# Patient Record
Sex: Male | Born: 1945
Health system: Southern US, Community
[De-identification: ages and names within clinical notes are randomized; demographics above are authoritative.]

## PROBLEM LIST (undated history)

## (undated) DIAGNOSIS — E781 Pure hyperglyceridemia: Secondary | ICD-10-CM

## (undated) DIAGNOSIS — E119 Type 2 diabetes mellitus without complications: Secondary | ICD-10-CM

## (undated) DIAGNOSIS — R0600 Dyspnea, unspecified: Secondary | ICD-10-CM

## (undated) DIAGNOSIS — I5042 Chronic combined systolic (congestive) and diastolic (congestive) heart failure: Secondary | ICD-10-CM

## (undated) DIAGNOSIS — Z953 Presence of xenogenic heart valve: Secondary | ICD-10-CM

## (undated) DIAGNOSIS — I429 Cardiomyopathy, unspecified: Secondary | ICD-10-CM

## (undated) DIAGNOSIS — E785 Hyperlipidemia, unspecified: Secondary | ICD-10-CM

## (undated) DIAGNOSIS — I10 Essential (primary) hypertension: Secondary | ICD-10-CM

## (undated) DIAGNOSIS — J449 Chronic obstructive pulmonary disease, unspecified: Secondary | ICD-10-CM

## (undated) DIAGNOSIS — I480 Paroxysmal atrial fibrillation: Secondary | ICD-10-CM

## (undated) DIAGNOSIS — M1990 Unspecified osteoarthritis, unspecified site: Secondary | ICD-10-CM

## (undated) DIAGNOSIS — I729 Aneurysm of unspecified site: Secondary | ICD-10-CM

## (undated) HISTORY — DX: Pure hyperglyceridemia: E78.1

## (undated) HISTORY — PX: CARDIAC VALVE REPLACEMENT: SHX585

## (undated) HISTORY — DX: Cardiomyopathy, unspecified: I42.9

## (undated) HISTORY — PX: OTHER SURGICAL HISTORY: SHX169

## (undated) HISTORY — PX: CARDIAC CATHETERIZATION: SHX172

---
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
## 2007-05-19 HISTORY — PX: AORTIC VALVE REPLACEMENT: SHX41

## 2010-10-03 ENCOUNTER — Emergency Department (HOSPITAL_COMMUNITY)
Admission: EM | Admit: 2010-10-03 | Discharge: 2010-10-03 | Payer: Self-pay | Source: Home / Self Care | Admitting: Family Medicine

## 2012-01-07 ENCOUNTER — Ambulatory Visit (HOSPITAL_COMMUNITY)
Admission: RE | Admit: 2012-01-07 | Discharge: 2012-01-07 | Disposition: A | Discharge status: Home / Self Care | Payer: 59 | Source: Ambulatory Visit | Attending: Cardiology | Admitting: Cardiology

## 2012-01-07 DIAGNOSIS — R5383 Other fatigue: Secondary | ICD-10-CM

## 2012-01-07 DIAGNOSIS — R5381 Other malaise: Secondary | ICD-10-CM | POA: Insufficient documentation

## 2012-05-23 ENCOUNTER — Emergency Department (HOSPITAL_COMMUNITY): Payer: 59

## 2012-05-23 ENCOUNTER — Emergency Department (HOSPITAL_COMMUNITY)
Admission: EM | Admit: 2012-05-23 | Discharge: 2012-05-23 | Disposition: A | Discharge status: Home / Self Care | Payer: 59 | Source: Home / Self Care | Attending: Family Medicine | Admitting: Family Medicine

## 2012-05-23 ENCOUNTER — Encounter (HOSPITAL_COMMUNITY): Payer: Self-pay

## 2012-05-23 ENCOUNTER — Emergency Department (INDEPENDENT_AMBULATORY_CARE_PROVIDER_SITE_OTHER): Payer: 59

## 2012-05-23 DIAGNOSIS — M25511 Pain in right shoulder: Secondary | ICD-10-CM

## 2012-05-23 DIAGNOSIS — M25519 Pain in unspecified shoulder: Secondary | ICD-10-CM

## 2012-05-23 MED ORDER — KETOROLAC TROMETHAMINE 60 MG/2ML IM SOLN
INTRAMUSCULAR | Status: AC
  Filled 2012-05-23: qty 2

## 2012-05-23 MED ORDER — MELOXICAM 7.5 MG PO TABS: 7.5000 mg | ORAL_TABLET | Freq: Every day | ORAL | Status: AC

## 2012-05-23 MED ORDER — TRAMADOL HCL 50 MG PO TABS: 50.0000 mg | ORAL_TABLET | Freq: Four times a day (QID) | ORAL | Status: AC | PRN

## 2012-05-23 MED ORDER — KETOROLAC TROMETHAMINE 60 MG/2ML IM SOLN
60.0000 mg | Freq: Once | INTRAMUSCULAR | Status: AC
  Administered 2012-05-23: 60 mg via INTRAMUSCULAR

## 2012-05-23 NOTE — ED Provider Notes (Signed)
Medical screening examination/treatment/procedure(s) were performed by non-physician practitioner and as supervising physician I was immediately available for consultation/collaboration.  Leslee Home, M.D.   Reuben Likes, MD 05/23/12 [phone]

## 2012-05-23 NOTE — ED Provider Notes (Signed)
History     CSN: [id number]  Arrival date & time 05/23/12  1618   First MD Initiated Contact with Patient 05/23/12 1818      Chief Complaint  Patient presents with  . Shoulder Pain    (Consider location/radiation/quality/duration/timing/severity/associated sxs/prior treatment) Patient is a 66 y.o. male presenting with shoulder pain. The history is provided by the patient.  Shoulder Pain This is a recurrent problem. The current episode started more than 2 days ago. The problem occurs constantly. The problem has not changed since onset.Exacerbated by: movement, lifting. The symptoms are relieved by NSAIDs (aleve).    Past Medical History  Diagnosis Date  . Diabetes mellitus     History reviewed. No pertinent past surgical history.  History reviewed. No pertinent family history.  History  Substance Use Topics  . Smoking status: Never Smoker   . Smokeless tobacco: Not on file  . Alcohol Use: No      Review of Systems  Constitutional: Negative.   Respiratory: Negative.   Cardiovascular: Negative.   Musculoskeletal: Positive for arthralgias. Negative for myalgias, back pain, joint swelling and gait problem.    Allergies  Review of patient's allergies indicates no known allergies.  Home Medications   Current Outpatient Rx  Name Route Sig Dispense Refill  . METFORMIN HCL 1000 MG PO TABS Oral Take 1,000 mg by mouth 2 (two) times daily with a meal.    . MELOXICAM 7.5 MG PO TABS Oral Take 1 tablet (7.5 mg total) by mouth daily. 30 tablet 1  . TRAMADOL HCL 50 MG PO TABS Oral Take 1 tablet (50 mg total) by mouth every 6 (six) hours as needed for pain. 15 tablet 0    BP 136/82  Pulse 70  Temp 98.7 F (37.1 C) (Oral)  Resp 16  SpO2 96%  Physical Exam  Nursing note and vitals reviewed. Constitutional: He is oriented to person, place, and time. Vital signs are normal. He appears well-developed and well-nourished. He is active and cooperative.  HENT:  Head:  Normocephalic.  Eyes: Conjunctivae are normal. Pupils are equal, round, and reactive to light. No scleral icterus.  Neck: Trachea normal. Neck supple.  Cardiovascular: Normal rate, regular rhythm and normal heart sounds.   Pulmonary/Chest: Effort normal and breath sounds normal.  Musculoskeletal:       Right shoulder: He exhibits tenderness, crepitus and pain. He exhibits normal range of motion, no swelling, no effusion, no deformity, no spasm, normal pulse and normal strength.       Left shoulder: Normal.       Cervical back: Normal.  Neurological: He is alert and oriented to person, place, and time. No cranial nerve deficit or sensory deficit.  Skin: Skin is warm and dry.  Psychiatric: He has a normal mood and affect. His speech is normal and behavior is normal. Judgment and thought content normal. Cognition and memory are normal.    ED Course  Procedures (including critical care time)  Labs Reviewed - No data to display Dg Shoulder Right  05/23/2012  *RADIOLOGY REPORT*  Clinical Data: Right shoulder pain.  RIGHT SHOULDER - 2+ VIEW  Comparison: None.  Findings: No evidence of fracture or dislocation.  Mild to moderate degenerative spurring is seen involving the acromioclavicular joint, and the inferior glenohumeral joint.  No other significant bone abnormality identified.  IMPRESSION:  1.  No acute findings. 2.  Acromioclavicular and glenohumeral degenerative changes.   Original Report Authenticated By: Danae Orleans, M.D.  1. Right shoulder pain       MDM  Take medication as prescribed.  Follow up with your primary care provider or ortho in one week.          Johnsie Kindred, NP 05/23/12 1901

## 2012-05-23 NOTE — ED Notes (Signed)
Denies recent injury, but has reportedly been having pain right shoulder past few weeks, worse at night. NAD

## 2012-06-19 ENCOUNTER — Ambulatory Visit (HOSPITAL_COMMUNITY)
Admission: RE | Admit: 2012-06-19 | Discharge: 2012-06-19 | Disposition: A | Discharge status: Home / Self Care | Payer: 59 | Source: Ambulatory Visit | Attending: Cardiology | Admitting: Cardiology

## 2012-06-19 DIAGNOSIS — E119 Type 2 diabetes mellitus without complications: Secondary | ICD-10-CM | POA: Insufficient documentation

## 2012-06-19 DIAGNOSIS — M79609 Pain in unspecified limb: Secondary | ICD-10-CM

## 2012-06-19 DIAGNOSIS — I739 Peripheral vascular disease, unspecified: Secondary | ICD-10-CM

## 2012-06-19 NOTE — Progress Notes (Signed)
VASCULAR LAB PRELIMINARY  ARTERIAL  ABI completed:  ABIs normal.    RIGHT    LEFT    PRESSURE WAVEFORM  PRESSURE WAVEFORM  BRACHIAL 160 Triphasic  BRACHIAL 143 Triphasic   DP   DP    AT 192 Triphasic  AT 191 Triphasic  PT 190 Triphasic  PT 189 Triphasic   PER   PER    GREAT TOE  NA GREAT TOE  NA    RIGHT LEFT  ABI >1 >1     Thomas Wright, RVT 06/19/2012, 2:43 PM

## 2012-08-09 ENCOUNTER — Emergency Department (INDEPENDENT_AMBULATORY_CARE_PROVIDER_SITE_OTHER): Payer: 59

## 2012-08-09 ENCOUNTER — Emergency Department (HOSPITAL_COMMUNITY)
Admission: EM | Admit: 2012-08-09 | Discharge: 2012-08-09 | Disposition: A | Discharge status: Home / Self Care | Payer: 59 | Source: Home / Self Care

## 2012-08-09 ENCOUNTER — Encounter (HOSPITAL_COMMUNITY): Payer: Self-pay | Admitting: Emergency Medicine

## 2012-08-09 DIAGNOSIS — S20229A Contusion of unspecified back wall of thorax, initial encounter: Secondary | ICD-10-CM

## 2012-08-09 DIAGNOSIS — S20219A Contusion of unspecified front wall of thorax, initial encounter: Secondary | ICD-10-CM

## 2012-08-09 DIAGNOSIS — W108XXA Fall (on) (from) other stairs and steps, initial encounter: Secondary | ICD-10-CM

## 2012-08-09 HISTORY — DX: Essential (primary) hypertension: I10

## 2012-08-09 NOTE — ED Notes (Signed)
Pt c/o lower back pain... Pt fell down a flight of 10 stairs (carpet flooring)... He chipped his tooth and c/o lower back pain since... Denies: head inj/loss of conscious... Pt is alert w/no signs of distress.

## 2012-08-09 NOTE — ED Provider Notes (Signed)
History     CSN: [id number]  Arrival date & time 08/09/12  1123   None     Chief Complaint  Patient presents with  . Back Pain    (Consider location/radiation/quality/duration/timing/severity/associated sxs/prior treatment) HPI Comments: 66 year old man slipped on the stair well last p.m. and slid down on his left side. He states that he is having pain in the left posterior back. It hurts with movement and taking a deep breath. He states he did strike his head but he does not have any pain oral lesions. He denies injury or pain to his neck upper back chest abdomen or extremities. He is fully awake alert and oriented and in no acute distress   Past Medical History  Diagnosis Date  . Diabetes mellitus   . Hypertension     Past Surgical History  Procedure Date  . Cardiac surgery     No family history on file.  History  Substance Use Topics  . Smoking status: Never Smoker   . Smokeless tobacco: Not on file  . Alcohol Use: No      Review of Systems  Constitutional: Negative.   HENT: Negative for nosebleeds, facial swelling, mouth sores, neck pain, neck stiffness and voice change.   Eyes: Negative.   Respiratory: Negative.   Cardiovascular: Negative.   Gastrointestinal: Negative.   Genitourinary: Negative.   Musculoskeletal: Positive for back pain. Negative for joint swelling and gait problem.  Skin: Negative.   Neurological: Negative.   Psychiatric/Behavioral: Negative.     Allergies  Review of patient's allergies indicates no known allergies.  Home Medications   Current Outpatient Rx  Name  Route  Sig  Dispense  Refill  . AMIODARONE HCL 200 MG PO TABS   Oral   Take 200 mg by mouth daily.         Marland Kitchen CARVEDILOL 25 MG PO TABS   Oral   Take 25 mg by mouth 2 (two) times daily with a meal.         . OMEGA-3 FATTY ACIDS 1000 MG PO CAPS   Oral   Take 2 g by mouth daily.         . FUROSEMIDE 20 MG PO TABS   Oral   Take 20 mg by mouth daily.           Marland Kitchen GLIPIZIDE 10 MG PO TABS   Oral   Take 10 mg by mouth 1 day or 1 dose.         Marland Kitchen LOSARTAN POTASSIUM 50 MG PO TABS   Oral   Take 50 mg by mouth daily.         Marland Kitchen METFORMIN HCL 1000 MG PO TABS   Oral   Take 1,000 mg by mouth 2 (two) times daily with a meal.         . MULTIVITAL PO   Oral   Take by mouth.         Marland Kitchen SITAGLIPTIN PHOSPHATE 100 MG PO TABS   Oral   Take 100 mg by mouth daily.         . MELOXICAM 7.5 MG PO TABS   Oral   Take 1 tablet (7.5 mg total) by mouth daily.   30 tablet   1     BP 161/84  Pulse 89  Temp 98 F (36.7 C) (Oral)  Resp 16  SpO2 95%  Physical Exam  Constitutional: He is oriented to person, place, and time. He appears well-developed and well-nourished.  HENT:  Head: Normocephalic and atraumatic.  Eyes: Conjunctivae normal and EOM are normal. Left eye exhibits no discharge.  Neck: Normal range of motion. Neck supple.  Cardiovascular: Normal rate, regular rhythm and normal heart sounds.   Pulmonary/Chest: Effort normal.       There is tenderness along the posterior ninth 10th ribs as well as the upper lumbar spine. Auscultation reveals crackles in the left base. Otherwise clear  Musculoskeletal: He exhibits tenderness. He exhibits no edema.  Neurological: He is alert and oriented to person, place, and time. No cranial nerve deficit.  Skin: Skin is warm and dry.  Psychiatric: He has a normal mood and affect.    ED Course  Procedures (including critical care time)  Labs Reviewed - No data to display Dg Ribs Unilateral W/chest Right  08/09/2012  *RADIOLOGY REPORT*  Clinical Data: Fall with right-sided flank pain and back pain.  RIGHT RIBS AND CHEST - 3+ VIEW  Comparison: Chest x-ray dated 10/03/2010  Findings: Frontal chest radiograph shows no evidence of pneumothorax, pulmonary consolidation or pleural fluid.  There is chronic elevation of the right hemidiaphragm.  Stable heart size and appearance of aortic valve replacement.   Right-sided rib films show no evidence of rib fracture or bony lesions.  IMPRESSION: No active disease.  Normal right ribs.   Original Report Authenticated By: Irish Lack, M.D.    Dg Lumbar Spine Complete  08/09/2012  *RADIOLOGY REPORT*  Clinical Data: Fall, back pain  LUMBAR SPINE - COMPLETE 4+ VIEW  Comparison: None.  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  Normal alignment.  No compression fracture, focal kyphosis or wedge-shaped deformity.  Lumbar degenerative changes most pronounced at L2-3 and L5 S1.  No definite pars defects.  Intact pedicles.  Slight curvature of the spine may be positional.  Normal SI joints.  IMPRESSION: Lower thoracic and lumbar moderately severe degenerative changes and spondylosis.  No acute finding by plain radiography   Original Report Authenticated By: Judie Petit. Shick, M.D.      1. Rib contusion   2. Back contusion       MDM  X-rays without acute findings He states he will do well on his ibuprofen 600 mg every 6 hours when necessary pain after taking with food Any change in status such as shortness of breath, cough, fever or other problems may return or go to your doctor. Take deep breaths every few minutes to keep lungs open and clear.         Hayden Rasmussen, NP 08/09/12 [phone]

## 2012-08-09 NOTE — ED Provider Notes (Signed)
Medical screening examination/treatment/procedure(s) were performed by resident physician or non-physician practitioner and as supervising physician I was immediately available for consultation/collaboration.   Carine Nordgren DOUGLAS MD.    Monda Chastain D Lalaine Overstreet, MD 08/09/12 1831 

## 2013-06-07 ENCOUNTER — Encounter: Payer: Self-pay | Admitting: Cardiology

## 2013-06-18 ENCOUNTER — Encounter: Payer: Self-pay | Admitting: Cardiology

## 2013-06-18 ENCOUNTER — Ambulatory Visit (INDEPENDENT_AMBULATORY_CARE_PROVIDER_SITE_OTHER): Payer: Medicare Other | Admitting: Cardiology

## 2013-06-18 VITALS — BP 128/70 | HR 81 | Ht 72.0 in | Wt 212.0 lb

## 2013-06-18 DIAGNOSIS — Z954 Presence of other heart-valve replacement: Secondary | ICD-10-CM

## 2013-06-18 DIAGNOSIS — I428 Other cardiomyopathies: Secondary | ICD-10-CM

## 2013-06-18 DIAGNOSIS — I429 Cardiomyopathy, unspecified: Secondary | ICD-10-CM

## 2013-06-18 DIAGNOSIS — Z952 Presence of prosthetic heart valve: Secondary | ICD-10-CM

## 2013-06-18 DIAGNOSIS — I4891 Unspecified atrial fibrillation: Secondary | ICD-10-CM

## 2013-06-18 DIAGNOSIS — E785 Hyperlipidemia, unspecified: Secondary | ICD-10-CM | POA: Insufficient documentation

## 2013-06-18 DIAGNOSIS — M549 Dorsalgia, unspecified: Secondary | ICD-10-CM

## 2013-06-18 DIAGNOSIS — I10 Essential (primary) hypertension: Secondary | ICD-10-CM | POA: Insufficient documentation

## 2013-06-18 DIAGNOSIS — E781 Pure hyperglyceridemia: Secondary | ICD-10-CM

## 2013-06-18 NOTE — Patient Instructions (Addendum)
Your physician recommends that you continue on your current medications as directed. Please refer to the Current Medication list given to you today.  Your physician wants you to follow-up in: 6 months with Dr. Anne Fu. You will receive a reminder letter in the mail two months in advance. If you don't receive a letter, please call our office to schedule the follow-up appointment.  Patient is cleared for Back Surgery should he decide to move forward with surgery per Dr. Anne Fu

## 2013-06-18 NOTE — Progress Notes (Signed)
Patient ID: Freada Bergeron, male   DOB: October 08, 1945, 67 y.o.   MRN: [id number]      [address] [place], [place]  [address] Phone: [phone] Fax:  [phone]  Date:  06/18/2013   ID:  Thomas Wright, DOB 1946/05/28, MRN [id number]  PCP:  Rozanna Box, MD   History of Present Illness: Thomas Wright is a 67 y.o. male history of bioprosthetic aortic valve replacement, hypertension, hyperlipidemia here for followup. Aortic valve was replaced 05/2007, 29 mm bioprosthetic Edwards valve. Relative [place]. At the time, ejection fraction 30% on cardiac catheterization. 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  He had atrial fibrillation as well and had been on amiodarone since surgery. I stopped it in 2013. No Further episodes.  Mild neuropathy.  Diabetes-previous hemoglobin A1c 10.4 is uncontrolled. He is working closely with his primary physician to help with this.  Will have possible hip surgery in the next few months. ?back surgery. He is more concerned about the back. 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 Great eye exam recently. Excellent.    Wt Readings from Last 3 Encounters:  06/18/13 96.163 kg (212 lb)     Past Medical History  Diagnosis Date  . Diabetes mellitus   . Hypertension   . Atrial fibrillation   . Hypertriglyceridemia     Past Surgical History  Procedure Laterality Date  . Cardiac surgery  05/2007    Aortic Valve Replacement    Current Outpatient Prescriptions  Medication Sig Dispense Refill  . aspirin 81 MG tablet Take 81 mg by mouth daily.      . carvedilol (COREG) 25 MG tablet Take 25 mg by mouth 2 (two) times daily with a meal.      . fish oil-omega-3 fatty acids 1000 MG capsule Take 2 g by mouth daily.      . furosemide (LASIX) 20 MG tablet Take 20 mg by mouth daily.      Marland Kitchen glipiZIDE (GLUCOTROL) 10 MG tablet Take 10 mg by mouth 1 day or 1 dose.      . losartan (COZAAR) 50 MG tablet Take 50 mg by mouth daily.        . metFORMIN (GLUCOPHAGE) 1000 MG tablet Take 1,000 mg by mouth 2 (two) times daily with a meal.      . Multiple Vitamins-Minerals (MULTIVITAL PO) Take by mouth.       No current facility-administered medications for this visit.    Allergies:   No Known Allergies  Social History:  The patient  reports that he has never smoked. He does not have any smokeless tobacco history on file. He reports that he does not drink alcohol or use illicit drugs.   ROS:  Please see the history of present illness.   No bleeding, no CVA, no CP, SOB.    All other systems reviewed and negative.   PHYSICAL EXAM: VS:  BP 128/70  Pulse 81  Ht 6' (1.829 m)  Wt 96.163 kg (212 lb)  BMI 28.75 kg/m2 Well nourished, well developed, in no acute distress HEENT: normal Neck: no JVD Cardiac:  normal S1, sharp S2; RRR; no murmur Lungs:  clear to auscultation bilaterally, no wheezing, rhonchi or rales Abd: soft, nontender, no hepatomegaly Ext: no edema Skin: warm and dry Neuro: no focal abnormalities noted  EKG: previously NSR.  ASSESSMENT AND PLAN:  67 year old with aortic valve replacement 2008, nonischemic cardiomyopathy EF 45%, prior atrial fibrillation  no further.  1. Atrial fibrillation-no longer. Last EKG reassuring. He had stopped his amiodarone at previous visit. He is doing well. Aspirin. 2. Prior aortic valve replacement-bioprosthetic. Doing well. No change in symptoms. Last echocardiogram showed normal function. 3. Please use dental antibiotics for prophylaxis. 4. If he undergoes surgery for back pain/hip pain, he may proceed with low to moderate cardiac risk. Prior cardiac catheterization was reassuring. Ejection fraction 45% last checked. No anginal symptoms. 5. Diabetes-continue with close monitoring.  Signed, Donato Schultz, MD [facility]  06/18/2013 10:44 AM

## 2013-06-30 ENCOUNTER — Telehealth: Payer: Self-pay | Admitting: Cardiology

## 2013-06-30 NOTE — Telephone Encounter (Signed)
New Problem:  Pt states he wants to know if it's safe for him to take Viagra or another ED drug. 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  Pt would like to have a couple samples if possible to see if it works. Please advise

## 2013-07-01 NOTE — Telephone Encounter (Signed)
His aortic root is 4.5 cm. Ejection fraction approximately 40%. He has not had any syncope or low blood pressure. I would be okay with him trying Viagra. If he develops dizziness or low blood pressure, please stop. Do not take with other nitrates. I do not believe that we have any samples. I would be fine with him trying Viagra 25 mg one tablet when necessary dispense 6 tablets with 3 refills.

## 2013-07-01 NOTE — Telephone Encounter (Signed)
Please Review and advise 

## 2013-07-03 MED ORDER — SILDENAFIL CITRATE 25 MG PO TABS: 25.0000 mg | ORAL_TABLET | ORAL | Status: DC | PRN

## 2013-07-03 NOTE — Addendum Note (Signed)
Addended by: Corianna Avallone, Bartolo Darter D on: 07/03/2013 04:44 PM   Modules accepted: Orders

## 2013-07-03 NOTE — Telephone Encounter (Signed)
Advised patient that Dr. Anne Fu is okay with an Rx for Viagra 25 mg one tablet as needed,dispense 6 tablets with 3 refills, Advised to not take with Nitrates, if he develops any signs of dizziness or low blood pressure to stop the Viagra.

## 2013-08-24 ENCOUNTER — Other Ambulatory Visit: Payer: Self-pay

## 2013-08-24 MED ORDER — CARVEDILOL 25 MG PO TABS: 25.0000 mg | ORAL_TABLET | Freq: Two times a day (BID) | ORAL | Status: DC

## 2013-08-24 MED ORDER — LOSARTAN POTASSIUM 50 MG PO TABS: 50.0000 mg | ORAL_TABLET | Freq: Every day | ORAL | Status: DC

## 2013-09-17 DIAGNOSIS — I729 Aneurysm of unspecified site: Secondary | ICD-10-CM

## 2013-09-17 HISTORY — DX: Aneurysm of unspecified site: I72.9

## 2013-12-07 ENCOUNTER — Other Ambulatory Visit: Payer: Self-pay | Admitting: Cardiology

## 2013-12-07 ENCOUNTER — Encounter: Payer: Self-pay | Admitting: Cardiology

## 2013-12-07 DIAGNOSIS — I429 Cardiomyopathy, unspecified: Secondary | ICD-10-CM

## 2013-12-08 ENCOUNTER — Ambulatory Visit (HOSPITAL_COMMUNITY): Payer: Medicare HMO | Attending: Cardiology | Admitting: Radiology

## 2013-12-08 DIAGNOSIS — I359 Nonrheumatic aortic valve disorder, unspecified: Secondary | ICD-10-CM

## 2013-12-08 DIAGNOSIS — I428 Other cardiomyopathies: Secondary | ICD-10-CM | POA: Insufficient documentation

## 2013-12-08 DIAGNOSIS — I429 Cardiomyopathy, unspecified: Secondary | ICD-10-CM

## 2013-12-08 NOTE — Progress Notes (Signed)
Echocardiogram performed.  

## 2013-12-09 ENCOUNTER — Telehealth: Payer: Self-pay | Admitting: *Deleted

## 2013-12-09 DIAGNOSIS — I428 Other cardiomyopathies: Secondary | ICD-10-CM

## 2013-12-09 DIAGNOSIS — I10 Essential (primary) hypertension: Secondary | ICD-10-CM

## 2013-12-09 DIAGNOSIS — Z952 Presence of prosthetic heart valve: Secondary | ICD-10-CM

## 2013-12-09 NOTE — Telephone Encounter (Signed)
I spoke with pt about results of echo. He would like to proceed with CTA to evaluate ascending aorta He is aware our office will call him when scheduled at [facility]

## 2013-12-09 NOTE — Telephone Encounter (Signed)
Message copied by Verna Czech on Wed Dec 09, 2013  8:23 AM ------      Message from: [name] C      Created: Wed Dec 09, 2013  6:31 AM       Aortic root is increased from prior 28mm from 16mm. Please order CTA of aorta ascending to further evaluate. Dx. Dilated aortic root. ------

## 2013-12-10 ENCOUNTER — Other Ambulatory Visit (INDEPENDENT_AMBULATORY_CARE_PROVIDER_SITE_OTHER): Payer: 59

## 2013-12-10 DIAGNOSIS — I428 Other cardiomyopathies: Secondary | ICD-10-CM

## 2013-12-10 DIAGNOSIS — Z952 Presence of prosthetic heart valve: Secondary | ICD-10-CM

## 2013-12-10 LAB — BASIC METABOLIC PANEL
BUN: 17 mg/dL (ref 6–23)
CALCIUM: 9.3 mg/dL (ref 8.4–10.5)
CO2: 28 mEq/L (ref 19–32)
CREATININE: 0.8 mg/dL (ref 0.4–1.5)
Chloride: 97 mEq/L (ref 96–112)
GFR: 101.29 mL/min (ref >60.00)
GLUCOSE: 450 mg/dL — AB (ref 70–99)
Potassium: 5.8 mEq/L — ABNORMAL HIGH (ref 3.5–5.1)
Sodium: 132 mEq/L — ABNORMAL LOW (ref 135–145)

## 2013-12-10 NOTE — Addendum Note (Signed)
Addended by: Joelyn Oms F on: 12/10/2013 10:50 AM   Modules accepted: Orders

## 2013-12-10 NOTE — Telephone Encounter (Signed)
Bmp ordered prior to CTA Horton Chin RN

## 2013-12-11 ENCOUNTER — Telehealth: Payer: Self-pay | Admitting: Cardiology

## 2013-12-11 ENCOUNTER — Ambulatory Visit (INDEPENDENT_AMBULATORY_CARE_PROVIDER_SITE_OTHER): Payer: Commercial Managed Care - HMO | Admitting: *Deleted

## 2013-12-11 DIAGNOSIS — I428 Other cardiomyopathies: Secondary | ICD-10-CM

## 2013-12-11 DIAGNOSIS — Z954 Presence of other heart-valve replacement: Secondary | ICD-10-CM

## 2013-12-11 DIAGNOSIS — I4891 Unspecified atrial fibrillation: Secondary | ICD-10-CM

## 2013-12-11 DIAGNOSIS — I429 Cardiomyopathy, unspecified: Secondary | ICD-10-CM

## 2013-12-11 DIAGNOSIS — Z952 Presence of prosthetic heart valve: Secondary | ICD-10-CM

## 2013-12-11 LAB — BASIC METABOLIC PANEL
BUN: 14 mg/dL (ref 6–23)
CALCIUM: 9 mg/dL (ref 8.4–10.5)
CO2: 27 mEq/L (ref 19–32)
CREATININE: 0.7 mg/dL (ref 0.4–1.5)
Chloride: 96 mEq/L (ref 96–112)
GFR: 114.2 mL/min (ref >60.00)
Glucose, Bld: 440 mg/dL — ABNORMAL HIGH (ref 70–99)
Potassium: 4.2 mEq/L (ref 3.5–5.1)
SODIUM: 131 meq/L — AB (ref 135–145)

## 2013-12-11 NOTE — Telephone Encounter (Signed)
New message ° ° ° ° ° °Returning a nurses call °

## 2013-12-14 ENCOUNTER — Ambulatory Visit (INDEPENDENT_AMBULATORY_CARE_PROVIDER_SITE_OTHER)
Admission: RE | Admit: 2013-12-14 | Discharge: 2013-12-14 | Disposition: A | Discharge status: Home / Self Care | Payer: Commercial Managed Care - HMO | Source: Ambulatory Visit | Attending: Cardiology | Admitting: Cardiology

## 2013-12-14 DIAGNOSIS — I428 Other cardiomyopathies: Secondary | ICD-10-CM

## 2013-12-14 DIAGNOSIS — I10 Essential (primary) hypertension: Secondary | ICD-10-CM

## 2013-12-14 DIAGNOSIS — Z952 Presence of prosthetic heart valve: Secondary | ICD-10-CM

## 2013-12-14 MED ORDER — IOHEXOL 350 MG/ML SOLN
100.0000 mL | Freq: Once | INTRAVENOUS | Status: AC | PRN
  Administered 2013-12-14: 100 mL via INTRAVENOUS

## 2013-12-15 ENCOUNTER — Ambulatory Visit (INDEPENDENT_AMBULATORY_CARE_PROVIDER_SITE_OTHER): Payer: Commercial Managed Care - HMO | Admitting: Cardiology

## 2013-12-15 ENCOUNTER — Encounter: Payer: Self-pay | Admitting: Cardiology

## 2013-12-15 VITALS — BP 118/60 | HR 92 | Ht 72.0 in | Wt 199.0 lb

## 2013-12-15 DIAGNOSIS — I4891 Unspecified atrial fibrillation: Secondary | ICD-10-CM

## 2013-12-15 DIAGNOSIS — I10 Essential (primary) hypertension: Secondary | ICD-10-CM

## 2013-12-15 DIAGNOSIS — I429 Cardiomyopathy, unspecified: Secondary | ICD-10-CM

## 2013-12-15 DIAGNOSIS — I77810 Thoracic aortic ectasia: Secondary | ICD-10-CM

## 2013-12-15 DIAGNOSIS — I428 Other cardiomyopathies: Secondary | ICD-10-CM

## 2013-12-15 DIAGNOSIS — E119 Type 2 diabetes mellitus without complications: Secondary | ICD-10-CM

## 2013-12-15 DIAGNOSIS — Z954 Presence of other heart-valve replacement: Secondary | ICD-10-CM

## 2013-12-15 DIAGNOSIS — Z952 Presence of prosthetic heart valve: Secondary | ICD-10-CM

## 2013-12-15 MED ORDER — LOSARTAN POTASSIUM 50 MG PO TABS: 50.0000 mg | ORAL_TABLET | Freq: Every day | ORAL | Status: DC

## 2013-12-15 MED ORDER — FUROSEMIDE 20 MG PO TABS: 20.0000 mg | ORAL_TABLET | Freq: Every day | ORAL | Status: DC

## 2013-12-15 NOTE — Addendum Note (Signed)
Addended by: Phineas Inches D on: 12/15/2013 09:44 AM   Modules accepted: Orders

## 2013-12-15 NOTE — Patient Instructions (Signed)
Your physician recommends that you continue on your current medications as directed. Please refer to the Current Medication list given to you today.  Your physician wants you to follow-up in: 6 months with Dr. Skains. You will receive a reminder letter in the mail two months in advance. If you don't receive a letter, please call our office to schedule the follow-up appointment.  

## 2013-12-15 NOTE — Progress Notes (Signed)
[facility]. [address], [address] [place], [place]  [address] Phone: [phone] Fax:  [phone]  Date:  12/15/2013   ID:  Thomas Wright, DOB [birth year], MRN [id number]  PCP:  [name], MD   History of Present Illness: Thomas Wright is a 68 y.o. male with bioprosthetic aortic valve repair, dilated aortic root hypertension, hyperlipidemia here for followup.   His echocardiogram demonstrated sinus of Valsalva of approximately 47 mm. Because of this, a CT angiogram was performed which was reassuring only showing 42 mm. He was reassured. Unfortunately, he is going through a large amount of stress in his life, his wife is no longer with him. 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   [name] is orthopedic physician. He has been taking Tylenol and this has helped with his back and hip pain. He is not interested in performing surgery at this time because of cost.   Recent skin infections. On Doxy. ?esophagitis  Aortic valve repair- 2008. Had cardiomyopathy at the time 20% No CAD. EF. Jan 2009 - cardioverted. Current ejection fraction 45%  with aortic root dimension of 4.5 cm. no longer on amiodarone.   Diabetes- prior hemoglobin A1c 10.4-uncontrolled. Working closely with [name]   Hyperlipidemia-triglycerides 259, LDL 99, likely falsely lowered by elevated triglycerides. Fish oil will improve this. Continue to exercise, diet. I agree that prescription medicine may be needed in the future. His triglycerides will be challenging to control in the setting of uncontrolled diabetes however.   Wt Readings from Last 3 Encounters:  12/15/13 199 lb (90.266 kg)  06/18/13 212 lb (96.163 kg)     Past Medical History  Diagnosis Date  . Diabetes mellitus   . Hypertension   . Atrial fibrillation   .  Hypertriglyceridemia   . Afib     Stopped amiodarone 2013-no further episodes of atrial fibrillation. Postoperative 2008  . H/O aortic valve replacement with tissue graft     2008.  . Back pain   . Cardiomyopathy     EF originally 20%-now 45%, no CAD on cath    Past Surgical History  Procedure Laterality Date  . Cardiac surgery  05/2007    Aortic Valve Replacement    Current Outpatient Prescriptions  Medication Sig Dispense Refill  . aspirin 81 MG tablet Take 81 mg by mouth daily.      . carvedilol (COREG) 25 MG tablet Take 1 tablet (25 mg total) by mouth 2 (two) times daily with a meal.  60 tablet  6  . fish oil-omega-3 fatty acids 1000 MG capsule Take 2 g by mouth daily.      . furosemide (LASIX) 20 MG tablet Take 20 mg by mouth daily.      Marland Kitchen glipiZIDE (GLUCOTROL) 10 MG tablet Take 10 mg by mouth 1 day or 1 dose.      . losartan (COZAAR) 50 MG tablet Take 1 tablet (50 mg total) by mouth daily.  30 tablet  6  . metFORMIN (GLUCOPHAGE) 1000 MG tablet Take 1,000 mg by mouth 2 (two) times daily with a meal.      . Multiple Vitamins-Minerals (MULTIVITAL PO) Take by mouth.      . sildenafil (VIAGRA) 25 MG tablet Take 1 tablet (25 mg total) by mouth as needed for  erectile dysfunction.  6 tablet  3   No current facility-administered medications for this visit.    Allergies:   No Known Allergies  Social History:  The patient  reports that he has never smoked. He does not have any smokeless tobacco history on file. He reports that he does not drink alcohol or use illicit drugs.   ROS:  Please see the history of present illness.   Denies any fevers, chills, orthopnea, PND    PHYSICAL EXAM: VS:  Ht 6' (1.829 m)  Wt 199 lb (90.266 kg)  BMI 26.98 kg/m2 Well nourished, well developed, in no acute distress HEENT: normal Neck: no JVD Cardiac:  normal S1, S2; RRR; no significant murmur Lungs:  clear to auscultation bilaterally, no wheezing, rhonchi or rales Abd: soft, nontender, no  hepatomegaly Ext: no edema Skin: warm and dry Neuro: no focal abnormalities noted  EKG:  12/15/13-sinus rhythm, LVH, prolonged QT 489 ms QTC.     ECHO 12/08/13: - Left ventricle: The cavity size was mildly dilated. Wall thickness was increased in a pattern of mild LVH. There was mild focal basal hypertrophy of the septum. Systolic function was mildly reduced. The estimated ejection fraction was in the range of 45% to 50%. Diffuse hypokinesis. Features are consistent with a pseudonormal left ventricular filling pattern, with concomitant abnormal relaxation and increased filling pressure (grade 2 diastolic dysfunction). Doppler parameters are consistent with high ventricular filling pressure. - Aortic valve: A bioprosthesis was present. Trivial regurgitation. - Aortic root: The aortic root was moderately dilated. - Mitral valve: Calcified annulus. - Left atrium: The atrium was moderately dilated. Impressions:  - Aortic root moderately dilated; suggest CTA or MRA to further assess.  CTA 12/14/13 -  Mild dilatation of the proximal aorta at the sinuses of Valsalva, measuring 4.2 cm in estimated diameter. The rest of the thoracic aorta is of normal caliber. No dissection is identified. Scattered calcified plaque is identified in the distribution of the LAD.     ASSESSMENT AND PLAN:  1. Dilated aortic root. CT angiogram was reassuring. This in fact only showed 42 mm. Echocardiogram had been estimating 45-47 mm Sinus Valsalva. We will continue to monitor periodically with echocardiogram. Reassurance. Continue with aggressive pressure control.  2. Aortic valve replacement-doing well. No symptoms. Echocardiogram reassuring. 3. Hypertension-under good control. 4. Cardiomyopathy-much improved, 45-50% EF. During aortic valve replacement, EF was 20%. 5. Postop atrial fibrillation-no further episodes. Off amiodarone. 6. Diabetes-working diligently on this. Stress the importance of  this. 7. Six-month followup. 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  Signed, [name], MD [facility]  12/15/2013 9:04 AM

## 2013-12-18 ENCOUNTER — Telehealth: Payer: Self-pay | Admitting: Cardiology

## 2013-12-18 MED ORDER — GLIPIZIDE 5 MG PO TABS: 5.0000 mg | ORAL_TABLET | Freq: Every day | ORAL | Status: DC

## 2013-12-18 NOTE — Telephone Encounter (Signed)
Message copied by Aldora Perman, Jerl Mina on Fri Dec 18, 2013  8:47 AM ------      Message from: Jerline Pain      Created: Fri Dec 11, 2013  4:56 PM       Potassium is now normal, excellent. Glucose still remains significantly elevated. Please discuss with your primary physician, diabetic doctor. ------

## 2013-12-18 NOTE — Telephone Encounter (Signed)
Lab results with medication change by PCP

## 2013-12-26 ENCOUNTER — Emergency Department (HOSPITAL_COMMUNITY)
Admission: EM | Admit: 2013-12-26 | Discharge: 2013-12-26 | Discharge status: Against Medical Advice | Payer: Commercial Managed Care - HMO | Source: Home / Self Care

## 2013-12-26 ENCOUNTER — Encounter (HOSPITAL_COMMUNITY): Payer: Self-pay | Admitting: Emergency Medicine

## 2013-12-26 NOTE — ED Notes (Signed)
Pt walked out the room saying "he was leaving and could not wait any longer" Adv pt he was next and the provider would be with him shortly Pt kept on walking and left AMA Notified Shanon Brow, NP

## 2013-12-26 NOTE — ED Provider Notes (Signed)
CSN: [id number]     Arrival date & time 12/26/13  1007 History   First MD Initiated Contact with Patient 12/26/13 1152     Chief Complaint  Patient presents with  . Back Pain   (Consider location/radiation/quality/duration/timing/severity/associated sxs/prior Treatment) HPI Comments: Pt left prior to being seen by provider   Past Medical History  Diagnosis Date  . Diabetes mellitus   . Hypertension   . Atrial fibrillation   . Hypertriglyceridemia   . Afib     Stopped amiodarone 2013-no further episodes of atrial fibrillation. Postoperative 2008  . H/O aortic valve replacement with tissue graft     2008.  . Back pain   . Cardiomyopathy     EF originally 20%-now 45%, no CAD on cath   Past Surgical History  Procedure Laterality Date  . Cardiac surgery  05/2007    Aortic Valve Replacement   Family History  Problem Relation Age of Onset  . Colon cancer Father   . Liver disease Brother   . Heart murmur Child   . Congenital heart disease Other    History  Substance Use Topics  . Smoking status: Never Smoker   . Smokeless tobacco: Not on file  . Alcohol Use: No    Review of Systems  Allergies  Review of patient's allergies indicates no known allergies.  Home Medications   Current Outpatient Rx  Name  Route  Sig  Dispense  Refill  . carvedilol (COREG) 25 MG tablet   Oral   Take 1 tablet (25 mg total) by mouth 2 (two) times daily with a meal.   60 tablet   6   . metFORMIN (GLUCOPHAGE) 1000 MG tablet   Oral   Take 1,000 mg by mouth 2 (two) times daily with a meal.         . aspirin 81 MG tablet   Oral   Take 81 mg by mouth daily.         . fish oil-omega-3 fatty acids 1000 MG capsule   Oral   Take 2 g by mouth daily.         . furosemide (LASIX) 20 MG tablet   Oral   Take 1 tablet (20 mg total) by mouth daily.   90 tablet   2   . glipiZIDE (GLUCOTROL) 5 MG tablet   Oral   Take 1 tablet (5 mg total) by mouth daily before breakfast.   90  tablet   0   . losartan (COZAAR) 50 MG tablet   Oral   Take 1 tablet (50 mg total) by mouth daily.   90 tablet   2   . Multiple Vitamins-Minerals (MULTIVITAL PO)   Oral   Take by mouth.         . sildenafil (VIAGRA) 25 MG tablet   Oral   Take 1 tablet (25 mg total) by mouth as needed for erectile dysfunction.   6 tablet   3    BP 116/71  Pulse 81  Temp(Src) 97.8 F (36.6 C) (Oral)  Resp 18  SpO2 96% Physical Exam  ED Course  Procedures (including critical care time) Labs Review Labs Reviewed - No data to display Imaging Review No results found.   MDM  No diagnosis found. Not seen by provider    Janne Napoleon, NP 12/26/13 1534

## 2013-12-26 NOTE — ED Notes (Addendum)
Pt c/o back/hip pain on right side onset x1 month Reports hx of crushed spine; ran out of his tramadol 50 mg and needing refill Ortho has suggested surgery but pt can't afford it at the moment Pain increases w/activity Alert w/no signs of acute distress.

## 2013-12-29 NOTE — ED Provider Notes (Signed)
Pt LWBS  Gregor Hams, MD 12/29/13 1640

## 2014-02-04 ENCOUNTER — Other Ambulatory Visit: Payer: Self-pay

## 2014-02-04 MED ORDER — CARVEDILOL 25 MG PO TABS: 25.0000 mg | ORAL_TABLET | Freq: Two times a day (BID) | ORAL | Status: DC

## 2014-05-05 ENCOUNTER — Encounter (HOSPITAL_COMMUNITY)
Admission: EM | Disposition: A | Discharge status: Home / Self Care | Payer: Self-pay | Source: Home / Self Care | Attending: Internal Medicine

## 2014-05-05 ENCOUNTER — Emergency Department (HOSPITAL_COMMUNITY): Payer: Medicare HMO

## 2014-05-05 ENCOUNTER — Inpatient Hospital Stay (HOSPITAL_COMMUNITY)
Admission: EM | Admit: 2014-05-05 | Discharge: 2014-05-06 | DRG: 603 | Disposition: A | Discharge status: Home / Self Care | Payer: Medicare HMO | Attending: Internal Medicine | Admitting: Internal Medicine

## 2014-05-05 ENCOUNTER — Inpatient Hospital Stay (HOSPITAL_COMMUNITY): Payer: Medicare HMO | Admitting: Anesthesiology

## 2014-05-05 ENCOUNTER — Encounter (HOSPITAL_COMMUNITY): Payer: Medicare HMO | Admitting: Anesthesiology

## 2014-05-05 ENCOUNTER — Encounter (HOSPITAL_COMMUNITY): Payer: Self-pay | Admitting: Emergency Medicine

## 2014-05-05 ENCOUNTER — Inpatient Hospital Stay: Admit: 2014-05-05 | Payer: Self-pay | Admitting: Orthopedic Surgery

## 2014-05-05 DIAGNOSIS — I428 Other cardiomyopathies: Secondary | ICD-10-CM | POA: Diagnosis present

## 2014-05-05 DIAGNOSIS — M65839 Other synovitis and tenosynovitis, unspecified forearm: Secondary | ICD-10-CM | POA: Diagnosis present

## 2014-05-05 DIAGNOSIS — M79609 Pain in unspecified limb: Secondary | ICD-10-CM | POA: Diagnosis present

## 2014-05-05 DIAGNOSIS — M65849 Other synovitis and tenosynovitis, unspecified hand: Secondary | ICD-10-CM | POA: Diagnosis present

## 2014-05-05 DIAGNOSIS — Z7982 Long term (current) use of aspirin: Secondary | ICD-10-CM | POA: Diagnosis not present

## 2014-05-05 DIAGNOSIS — Z952 Presence of prosthetic heart valve: Secondary | ICD-10-CM | POA: Diagnosis not present

## 2014-05-05 DIAGNOSIS — L02519 Cutaneous abscess of unspecified hand: Principal | ICD-10-CM | POA: Diagnosis present

## 2014-05-05 DIAGNOSIS — M659 Synovitis and tenosynovitis, unspecified: Secondary | ICD-10-CM | POA: Diagnosis present

## 2014-05-05 DIAGNOSIS — IMO0002 Reserved for concepts with insufficient information to code with codable children: Secondary | ICD-10-CM | POA: Diagnosis present

## 2014-05-05 DIAGNOSIS — Z954 Presence of other heart-valve replacement: Secondary | ICD-10-CM

## 2014-05-05 DIAGNOSIS — Z800 Family history of malignant neoplasm of digestive organs: Secondary | ICD-10-CM

## 2014-05-05 DIAGNOSIS — I429 Cardiomyopathy, unspecified: Secondary | ICD-10-CM

## 2014-05-05 DIAGNOSIS — E119 Type 2 diabetes mellitus without complications: Secondary | ICD-10-CM | POA: Diagnosis present

## 2014-05-05 DIAGNOSIS — Z791 Long term (current) use of non-steroidal anti-inflammatories (NSAID): Secondary | ICD-10-CM | POA: Diagnosis not present

## 2014-05-05 DIAGNOSIS — I10 Essential (primary) hypertension: Secondary | ICD-10-CM | POA: Diagnosis present

## 2014-05-05 DIAGNOSIS — E781 Pure hyperglyceridemia: Secondary | ICD-10-CM | POA: Diagnosis present

## 2014-05-05 DIAGNOSIS — L03019 Cellulitis of unspecified finger: Principal | ICD-10-CM

## 2014-05-05 DIAGNOSIS — E1165 Type 2 diabetes mellitus with hyperglycemia: Secondary | ICD-10-CM

## 2014-05-05 HISTORY — PX: I & D EXTREMITY: SHX5045

## 2014-05-05 LAB — CBC WITH DIFFERENTIAL/PLATELET
Basophils Absolute: 0 10*3/uL (ref 0.0–0.1)
Basophils Relative: 0 % (ref 0–1)
EOS ABS: 0.2 10*3/uL (ref 0.0–0.7)
Eosinophils Relative: 1 % (ref 0–5)
HCT: 38.9 % — ABNORMAL LOW (ref 39.0–52.0)
Hemoglobin: 13.7 g/dL (ref 13.0–17.0)
Lymphocytes Relative: 18 % (ref 12–46)
Lymphs Abs: 3.2 10*3/uL (ref 0.7–4.0)
MCH: 28.7 pg (ref 26.0–34.0)
MCHC: 35.2 g/dL (ref 30.0–36.0)
MCV: 81.6 fL (ref 78.0–100.0)
Monocytes Absolute: 2 10*3/uL — ABNORMAL HIGH (ref 0.1–1.0)
Monocytes Relative: 12 % (ref 3–12)
NEUTROS PCT: 69 % (ref 43–77)
Neutro Abs: 11.8 10*3/uL — ABNORMAL HIGH (ref 1.7–7.7)
PLATELETS: 308 10*3/uL (ref 150–400)
RBC: 4.77 MIL/uL (ref 4.22–5.81)
RDW: 12.4 % (ref 11.5–15.5)
WBC: 17.1 10*3/uL — ABNORMAL HIGH (ref 4.0–10.5)

## 2014-05-05 LAB — CBG MONITORING, ED: Glucose-Capillary: 270 mg/dL — ABNORMAL HIGH (ref 70–99)

## 2014-05-05 LAB — BASIC METABOLIC PANEL
Anion gap: 14 (ref 5–15)
BUN: 22 mg/dL (ref 6–23)
CO2: 22 mEq/L (ref 19–32)
Calcium: 9.3 mg/dL (ref 8.4–10.5)
Chloride: 96 mEq/L (ref 96–112)
Creatinine, Ser: 0.79 mg/dL (ref 0.50–1.35)
GFR calc Af Amer: >90 mL/min (ref >90)
Glucose, Bld: 310 mg/dL — ABNORMAL HIGH (ref 70–99)
POTASSIUM: 5 meq/L (ref 3.7–5.3)
SODIUM: 132 meq/L — AB (ref 137–147)

## 2014-05-05 LAB — GRAM STAIN

## 2014-05-05 SURGERY — IRRIGATION AND DEBRIDEMENT EXTREMITY
Anesthesia: General | Site: Finger | Laterality: Right

## 2014-05-05 SURGERY — INCISION AND DRAINAGE
Anesthesia: General | Laterality: Right

## 2014-05-05 MED ORDER — MIDAZOLAM HCL 2 MG/2ML IJ SOLN: 2.0000 mg | Freq: Once | INTRAMUSCULAR | Status: DC

## 2014-05-05 MED ORDER — INSULIN ASPART 100 UNIT/ML ~~LOC~~ SOLN
SUBCUTANEOUS | Status: AC
  Filled 2014-05-05: qty 2

## 2014-05-05 MED ORDER — BUPIVACAINE HCL 0.25 % IJ SOLN
INTRAMUSCULAR | Status: DC | PRN
  Administered 2014-05-05: 6 mL

## 2014-05-05 MED ORDER — INSULIN ASPART 100 UNIT/ML ~~LOC~~ SOLN
0.0000 [IU] | SUBCUTANEOUS | Status: DC
  Administered 2014-05-05: 2 [IU] via SUBCUTANEOUS
  Administered 2014-05-06: 3 [IU] via SUBCUTANEOUS
  Administered 2014-05-06: 5 [IU] via SUBCUTANEOUS
  Administered 2014-05-06: 2 [IU] via SUBCUTANEOUS
  Administered 2014-05-06: 3 [IU] via SUBCUTANEOUS

## 2014-05-05 MED ORDER — ONDANSETRON HCL 4 MG/2ML IJ SOLN
INTRAMUSCULAR | Status: DC | PRN
  Administered 2014-05-05: 4 mg via INTRAVENOUS

## 2014-05-05 MED ORDER — PHENYLEPHRINE HCL 10 MG/ML IJ SOLN
INTRAMUSCULAR | Status: DC | PRN
  Administered 2014-05-05 (×2): 40 ug via INTRAVENOUS
  Administered 2014-05-05 (×3): 80 ug via INTRAVENOUS

## 2014-05-05 MED ORDER — LIDOCAINE HCL (CARDIAC) 20 MG/ML IV SOLN
INTRAVENOUS | Status: DC | PRN
  Administered 2014-05-05: 60 mg via INTRAVENOUS

## 2014-05-05 MED ORDER — FENTANYL CITRATE 0.05 MG/ML IJ SOLN
INTRAMUSCULAR | Status: DC | PRN
  Administered 2014-05-05 (×4): 50 ug via INTRAVENOUS

## 2014-05-05 MED ORDER — SODIUM CHLORIDE 0.9 % IV BOLUS (SEPSIS): 1000.0000 mL | Freq: Once | INTRAVENOUS | Status: DC

## 2014-05-05 MED ORDER — SODIUM CHLORIDE 0.9 % IR SOLN
Status: DC | PRN
  Administered 2014-05-05: 1000 mL

## 2014-05-05 MED ORDER — ARTIFICIAL TEARS OP OINT
TOPICAL_OINTMENT | OPHTHALMIC | Status: DC | PRN
  Administered 2014-05-05: 1 via OPHTHALMIC

## 2014-05-05 MED ORDER — LACTATED RINGERS IV SOLN
INTRAVENOUS | Status: DC | PRN
  Administered 2014-05-05 (×2): via INTRAVENOUS

## 2014-05-05 MED ORDER — PROPOFOL 10 MG/ML IV BOLUS
INTRAVENOUS | Status: DC | PRN
  Administered 2014-05-05: 170 mg via INTRAVENOUS
  Administered 2014-05-05: 30 mg via INTRAVENOUS

## 2014-05-05 MED ORDER — OXYCODONE HCL 5 MG PO TABS
ORAL_TABLET | ORAL | Status: AC
  Filled 2014-05-05: qty 1

## 2014-05-05 MED ORDER — ONDANSETRON HCL 4 MG/2ML IJ SOLN: 4.0000 mg | Freq: Once | INTRAMUSCULAR | Status: DC | PRN

## 2014-05-05 MED ORDER — PROPOFOL 10 MG/ML IV BOLUS: INTRAVENOUS | Status: DC | PRN

## 2014-05-05 MED ORDER — FENTANYL CITRATE 0.05 MG/ML IJ SOLN: 25.0000 ug | INTRAMUSCULAR | Status: DC | PRN

## 2014-05-05 MED ORDER — HYDROMORPHONE HCL PF 1 MG/ML IJ SOLN
INTRAMUSCULAR | Status: AC
  Filled 2014-05-05: qty 1

## 2014-05-05 MED ORDER — OXYCODONE HCL 5 MG PO TABS
5.0000 mg | ORAL_TABLET | Freq: Once | ORAL | Status: AC | PRN
  Administered 2014-05-05: 5 mg via ORAL

## 2014-05-05 MED ORDER — VANCOMYCIN HCL IN DEXTROSE 1-5 GM/200ML-% IV SOLN
1000.0000 mg | Freq: Two times a day (BID) | INTRAVENOUS | Status: DC
  Administered 2014-05-06 (×2): 1000 mg via INTRAVENOUS
  Filled 2014-05-05 (×3): qty 200

## 2014-05-05 MED ORDER — MIDAZOLAM HCL 5 MG/5ML IJ SOLN
INTRAMUSCULAR | Status: DC | PRN
  Administered 2014-05-05: 1 mg via INTRAVENOUS

## 2014-05-05 MED ORDER — OXYCODONE HCL 5 MG/5ML PO SOLN: 5.0000 mg | Freq: Once | ORAL | Status: AC | PRN

## 2014-05-05 MED ORDER — INSULIN ASPART 100 UNIT/ML ~~LOC~~ SOLN
SUBCUTANEOUS | Status: AC
  Administered 2014-05-05: 10 [IU] via SUBCUTANEOUS
  Filled 2014-05-05: qty 10

## 2014-05-05 MED ORDER — PIPERACILLIN-TAZOBACTAM 3.375 G IVPB
3.3750 g | Freq: Three times a day (TID) | INTRAVENOUS | Status: DC
  Administered 2014-05-06 (×3): 3.375 g via INTRAVENOUS
  Filled 2014-05-05 (×7): qty 50

## 2014-05-05 SURGICAL SUPPLY — 54 items
BANDAGE ELASTIC 3 VELCRO ST LF (GAUZE/BANDAGES/DRESSINGS) ×3 IMPLANT
BANDAGE ELASTIC 4 VELCRO ST LF (GAUZE/BANDAGES/DRESSINGS) IMPLANT
BNDG CMPR 9X4 STRL LF SNTH (GAUZE/BANDAGES/DRESSINGS)
BNDG CONFORM 2 STRL LF (GAUZE/BANDAGES/DRESSINGS) IMPLANT
BNDG CONFORM 3 STRL LF (GAUZE/BANDAGES/DRESSINGS) ×3 IMPLANT
BNDG ESMARK 4X9 LF (GAUZE/BANDAGES/DRESSINGS) IMPLANT
BNDG GAUZE ELAST 4 BULKY (GAUZE/BANDAGES/DRESSINGS) IMPLANT
CORDS BIPOLAR (ELECTRODE) ×3 IMPLANT
COVER SURGICAL LIGHT HANDLE (MISCELLANEOUS) ×3 IMPLANT
CUFF TOURNIQUET SINGLE 18IN (TOURNIQUET CUFF) ×3 IMPLANT
DECANTER SPIKE VIAL GLASS SM (MISCELLANEOUS) IMPLANT
DRAIN PENROSE 1/4X12 LTX STRL (WOUND CARE) IMPLANT
DRAPE SURG 17X23 STRL (DRAPES) ×3 IMPLANT
DRSG PAD ABDOMINAL 8X10 ST (GAUZE/BANDAGES/DRESSINGS) IMPLANT
DURAPREP 26ML APPLICATOR (WOUND CARE) ×3 IMPLANT
ELECT REM PT RETURN 9FT ADLT (ELECTROSURGICAL)
ELECTRODE REM PT RTRN 9FT ADLT (ELECTROSURGICAL) IMPLANT
GAUZE PACKING IODOFORM 1/4X15 (GAUZE/BANDAGES/DRESSINGS) ×3 IMPLANT
GAUZE PACKING IODOFORM 1/4X5 (PACKING) ×3 IMPLANT
GAUZE SPONGE 4X4 12PLY STRL (GAUZE/BANDAGES/DRESSINGS) ×3 IMPLANT
GAUZE XEROFORM 1X8 LF (GAUZE/BANDAGES/DRESSINGS) IMPLANT
GLOVE BIO SURGEON STRL SZ8.5 (GLOVE) ×3 IMPLANT
GOWN STRL REUS W/ TWL LRG LVL3 (GOWN DISPOSABLE) ×1 IMPLANT
GOWN STRL REUS W/ TWL XL LVL3 (GOWN DISPOSABLE) ×1 IMPLANT
GOWN STRL REUS W/TWL LRG LVL3 (GOWN DISPOSABLE) ×3
GOWN STRL REUS W/TWL XL LVL3 (GOWN DISPOSABLE) ×3
HANDPIECE INTERPULSE COAX TIP (DISPOSABLE)
KIT BASIN OR (CUSTOM PROCEDURE TRAY) ×3 IMPLANT
KIT ROOM TURNOVER OR (KITS) ×3 IMPLANT
MANIFOLD NEPTUNE II (INSTRUMENTS) IMPLANT
NEEDLE HYPO 25GX1X1/2 BEV (NEEDLE) IMPLANT
NEEDLE HYPO 25X1 1.5 SAFETY (NEEDLE) IMPLANT
NS IRRIG 1000ML POUR BTL (IV SOLUTION) ×3 IMPLANT
PACK ORTHO EXTREMITY (CUSTOM PROCEDURE TRAY) ×3 IMPLANT
PAD ARMBOARD 7.5X6 YLW CONV (MISCELLANEOUS) ×3 IMPLANT
PAD CAST 4YDX4 CTTN HI CHSV (CAST SUPPLIES) IMPLANT
PADDING CAST ABS 3INX4YD NS (CAST SUPPLIES) ×2
PADDING CAST ABS COTTON 3X4 (CAST SUPPLIES) ×1 IMPLANT
PADDING CAST COTTON 4X4 STRL (CAST SUPPLIES)
SET HNDPC FAN SPRY TIP SCT (DISPOSABLE) IMPLANT
SPONGE GAUZE 4X4 12PLY STER LF (GAUZE/BANDAGES/DRESSINGS) ×3 IMPLANT
SPONGE LAP 18X18 X RAY DECT (DISPOSABLE) ×3 IMPLANT
SUCTION FRAZIER TIP 10 FR DISP (SUCTIONS) ×3 IMPLANT
SUT VICRYL RAPIDE 4/0 PS 2 (SUTURE) IMPLANT
SYR 20CC LL (SYRINGE) IMPLANT
SYR CONTROL 10ML LL (SYRINGE) ×3 IMPLANT
TOWEL OR 17X24 6PK STRL BLUE (TOWEL DISPOSABLE) ×3 IMPLANT
TOWEL OR 17X26 10 PK STRL BLUE (TOWEL DISPOSABLE) ×3 IMPLANT
TUBE ANAEROBIC SPECIMEN COL (MISCELLANEOUS) ×3 IMPLANT
TUBE CONNECTING 12'X1/4 (SUCTIONS) ×1
TUBE CONNECTING 12X1/4 (SUCTIONS) ×2 IMPLANT
UNDERPAD 30X30 INCONTINENT (UNDERPADS AND DIAPERS) ×3 IMPLANT
WATER STERILE IRR 1000ML POUR (IV SOLUTION) IMPLANT
YANKAUER SUCT BULB TIP NO VENT (SUCTIONS) IMPLANT

## 2014-05-05 NOTE — H&P (Signed)
PCP:  [name], MD  [facility]  Chief Complaint:  Finger swelling  HPI: Thomas Wright is a 68 y.o. male   has a past medical history of Diabetes mellitus; Hypertension; Atrial fibrillation; Hypertriglyceridemia; Afib; H/O aortic valve replacement with tissue graft; Back pain; and Cardiomyopathy.   Presented with  For the past week he have had some swelling around the nailbed. He tried to use home remedies such as Epson salt baths with iodine. Despite this the finger started to swell and turn red. Patient endorses chills but no fever. He presented to ER and was found to have elevated WBC up to 17.  IN ER patient was noted to have elevated BG up to  310 he states he has not been taking his medications today. Patient is sp bioprosthetic aortic valve repair in 2008. Patient had original EF of 20% BUT THIS HAS IMPROVED UP TO 45%. He also has hx of A.fib sp successful cardioversion in 2009 have been off Amiodarone since 2013 not anticoagulated.   Hand surgeon Dr. Burney Gauze was called and is planing to take him to OR tonight at [facility]. Patient was offered [facility] but chose to drive himself. He will go to ER and from there to [place]. [facility] was asked to admit after patient have been surgically  Treated. With IV antibiotics to be started after intraoperative cultures were obtained.  In  Hospitalist was called for admission for digit cellulitis  Review of Systems:    Pertinent positives include: chills  Constitutional:  No weight loss, night sweats, Fevers, , fatigue, weight loss  HEENT:  No headaches, Difficulty swallowing,Tooth/dental problems,Sore throat,  No sneezing, itching, ear ache, nasal congestion, post nasal drip,  Cardio-vascular:  No chest pain, Orthopnea, PND, anasarca, dizziness, palpitations.no Bilateral lower extremity swelling  GI:  No heartburn, indigestion, abdominal pain, nausea, vomiting, diarrhea, change in bowel habits, loss of appetite,  melena, blood in stool, hematemesis Resp:  no shortness of breath at rest. No dyspnea on exertion, No excess mucus, no productive cough, No non-productive cough, No coughing up of blood.No change in color of mucus.No wheezing. Skin:  no rash or lesions. No jaundice GU:  no dysuria, change in color of urine, no urgency or frequency. No straining to urinate.  No flank pain.  Musculoskeletal:  No joint pain or no joint swelling. No decreased range of motion. No back pain.  Psych:  No change in mood or affect. No depression or anxiety. No memory loss.  Neuro: no localizing neurological complaints, no tingling, no weakness, no double vision, no gait abnormality, no slurred speech, no confusion  Otherwise ROS are negative except for above, 10 systems were reviewed  Past Medical History: Past Medical History  Diagnosis Date  . Diabetes mellitus   . Hypertension   . Atrial fibrillation   . Hypertriglyceridemia   . Afib     Stopped amiodarone 2013-no further episodes of atrial fibrillation. Postoperative 2008  . H/O aortic valve replacement with tissue graft     2008.  . Back pain   . Cardiomyopathy     EF originally 20%-now 45%, no CAD on cath   Past Surgical History  Procedure Laterality Date  . Cardiac surgery  05/2007    Aortic Valve Replacement     Medications: Prior to Admission medications   Medication Sig Start Date End Date Taking? Authorizing Provider  aspirin 81 MG tablet Take 81 mg by mouth daily.   Yes Historical Provider, MD  carvedilol (  COREG) 25 MG tablet Take 1 tablet (25 mg total) by mouth 2 (two) times daily with a meal. 02/04/14  Yes [name], MD  furosemide (LASIX) 20 MG tablet Take 1 tablet (20 mg total) by mouth daily. 12/15/13  Yes [name], MD  glipiZIDE (GLUCOTROL) 5 MG tablet Take 1 tablet (5 mg total) by mouth daily before breakfast. 12/18/13  Yes [name], MD  ibuprofen (ADVIL,MOTRIN) 200 MG tablet Take 600 mg by mouth 3 (three) times daily as  needed for moderate pain.   Yes Historical Provider, MD  losartan (COZAAR) 50 MG tablet Take 1 tablet (50 mg total) by mouth daily. 12/15/13  Yes [name], MD  metFORMIN (GLUCOPHAGE) 1000 MG tablet Take 1,000 mg by mouth 2 (two) times daily with a meal.   Yes Historical Provider, MD    Allergies:  No Known Allergies  Social History:  Ambulatory  independently   Lives at home alone     reports that he has never smoked. He has never used smokeless tobacco. He reports that he does not drink alcohol or use illicit drugs.    Family History: family history includes Colon cancer in his father; Congenital heart disease in his other; Heart murmur in his child; Liver disease in his brother.    Physical Exam: Patient Vitals for the past 24 hrs:  BP Temp Temp src Pulse Resp SpO2  05/05/14 1647 131/59 mmHg 98.9 F (37.2 C) Oral 85 18 99 %    1. General:  in No Acute distress 2. Psychological: Alert and   Oriented 3. Head/ENT:   Moist  Mucous Membranes                          Head Non traumatic, neck supple                          Normal  Dentition 4. SKIN: normal  Skin turgor,  Skin clean Dry and intact no rash, swelling of right middle finger 5. Heart: Regular rate and rhythm systolic Murmur noted,no Rub or gallop 6. Lungs: Clear to auscultation bilaterally, no wheezes or crackles   7. Abdomen: Soft, non-tender, Non distended 8. Lower extremities: no clubbing, cyanosis, or edema 9. Neurologically Grossly intact, moving all 4 extremities equally 10. MSK: Normal range of motion  body mass index is unknown because there is no weight on file.   Labs on Admission:   Recent Labs  05/05/14 1734  NA 132*  K 5.0  CL 96  CO2 22  GLUCOSE 310*  BUN 22  CREATININE 0.79  CALCIUM 9.3   No results found for this basename: AST, ALT, ALKPHOS, BILITOT, PROT, ALBUMIN,  in the last 72 hours No results found for this basename: LIPASE, AMYLASE,  in the last 72 hours  Recent Labs   05/05/14 1734  WBC 17.1*  NEUTROABS 11.8*  HGB 13.7  HCT 38.9*  MCV 81.6  PLT 308   No results found for this basename: CKTOTAL, CKMB, CKMBINDEX, TROPONINI,  in the last 72 hours No results found for this basename: TSH, T4TOTAL, FREET3, T3FREE, THYROIDAB,  in the last 72 hours No results found for this basename: VITAMINB12, FOLATE, FERRITIN, TIBC, IRON, RETICCTPCT,  in the last 72 hours No results found for this basename: HGBA1C    The CrCl is unknown because both a height and weight (above a minimum accepted value) are required for this calculation. ABG  No results found for this basename: phart, pco2, po2, hco3, tco2, acidbasedef, o2sat     No results found for this basename: DDIMER     Other results:  I have pearsonaly reviewed this: ECG REPORT   HR 86  Rhythm: SR with LVH ST&T Change: no ischemia  BNP (last 3 results) No results found for this basename: PROBNP,  in the last 8760 hours  There were no vitals filed for this visit.   Cultures: No results found for this basename: sdes, specrequest, cult, reptstatus      Radiological Exams on Admission: Dg Finger Middle Right  05/05/2014   CLINICAL DATA:  Right middle finger injury 3 years ago. Persistent pain, swelling and erythema.  EXAM: RIGHT MIDDLE FINGER 2+V  COMPARISON:  None.  FINDINGS: The mineralization and alignment are normal. There is no evidence of acute fracture or dislocation. There is mild interphalangeal joint space loss, greatest distally. No erosive changes are identified. The soft tissues appear mildly prominent, potentially swollen. There is no evidence of a foreign body or bone destruction.  IMPRESSION: No acute osseous findings demonstrated. Mild interphalangeal degenerative changes with possible soft soft tissue swelling.   Electronically Signed   By: Camie Patience M.D.   On: 05/05/2014 17:33    Chart has been reviewed  Assessment/Plan  68 yo M with hx of DM, CM with EF of 45% here with right  middle finger cellulitis needing OR drainage at [facility].   Present on Admission:  . Cellulitis and abscess of digit -  transfer to [facility] for OR availability. Surgical drainage by Dr. Burney Gauze after intraoperative cultures obtained will start vancomycin and Zosyn and admitted to [facility]  . Hypertension - continue home medications  . Cardiomyopathy - restart Lasix tomorrow and avoid fluid overload  . Afib currently in sinus rhythm has been off amiodarone since 2013  . Diabetes mellitus - with hyperglycemia due to not taking his medications. Will start sliding scale restart glipizide tomorrow hold metformin while hospitalized patient received IV fluids an emerge department will need to follow blood glucose perioperatively.  Bovine Aortic valve replacement- no indication for anticoagulation.   Prophylaxis: SCD   CODE STATUS:  FULL CODE   Other plan as per orders.  I have spent a total of 65 min on this admission extra time was taken to arrange transfer and to discuss care with consultant.  [place] 05/05/2014, 8:01 PM  the  [facility]  Pager [phone]   If 7AM-7PM, please contact the day team taking care of the patient  [email or web address]  Password [email or web address]

## 2014-05-05 NOTE — Op Note (Signed)
See note [phone]

## 2014-05-05 NOTE — Op Note (Signed)
NAMEKELLEY, KNOTH NO.:  [id number]  MEDICAL RECORD NO.:  [id number]  LOCATION:  [id number]                        FACILITY:  [facility]  PHYSICIAN:  Sheral Apley. Markeesha Char, M.D.DATE OF BIRTH:  04-16-1946  DATE OF PROCEDURE:  05/05/2014 DATE OF DISCHARGE:                              OPERATIVE REPORT   PREOPERATIVE DIAGNOSIS:  Right long finger extensor sheath infection.  POSTOPERATIVE DIAGNOSIS:  Right long finger extensor sheath infection.  PROCEDURE:  Incision and drainage of above.  SURGEON:  Sheral Apley. Burney Gauze, M.D.  ASSISTANT:  None.  ANESTHESIA:  General.  COMPLICATIONS:  None.  DRAINS:  None.  SPECIMEN:  Cultures x2 sent.  Wound packed open.  DESCRIPTION OF PROCEDURE:  The patient was taken to the operating suite. After the induction of adequate general anesthesia, right upper extremity was prepped and draped in usual sterile fashion.  An Esmarch was used to exsanguinate the limb.  Tourniquet was inflated to 250 mmHg. At this point in time, a midline incision was made over the right long finger from the DIP crease to the PIP crease and proximal to the level of middle phalanx.  Skin was incised.  Purulence was encountered. Culture; aerobic, anaerobic, stat Gram stain.  Blunt dissection was carried down to the extensor tendon and extensor mechanism from the middle of the proximal phalanx to the DIP joint area.  The wound was thoroughly irrigated with 2 L of normal saline.  It was then packed open with iodoform gauze 1/4 inch and loosely closed with 3 nylon sutures. 4x4s, fluffs, and compressive dressing were applied.  The patient tolerated the procedure well, went to the recovery room in stable fashion.     Sheral Apley Burney Gauze, M.D.     MAW/MEDQ  D:  05/05/2014  T:  05/05/2014  Job:  [id number]

## 2014-05-05 NOTE — Progress Notes (Signed)
ANTIBIOTIC CONSULT NOTE - INITIAL  Pharmacy Consult for Vancomycin  Indication: Cellulitis  No Known Allergies  Patient Measurements: ~90 kg  Vital Signs: Temp: 99.3 F (37.4 C) (08/19 2205) Temp src: Oral (08/19 2017) BP: 137/61 mmHg (08/19 2230) Pulse Rate: 84 (08/19 2230)  Labs:  Recent Labs  05/05/14 1734  WBC 17.1*  HGB 13.7  PLT 308  CREATININE 0.79    Medical History: Past Medical History  Diagnosis Date  . Diabetes mellitus   . Hypertension   . Atrial fibrillation   . Hypertriglyceridemia   . Afib     Stopped amiodarone 2013-no further episodes of atrial fibrillation. Postoperative 2008  . H/O aortic valve replacement with tissue graft     2008.  . Back pain   . Cardiomyopathy     EF originally 20%-now 45%, no CAD on cath   Assessment: [name] with cellulitis of the right long finger now s/p I&D. Starting vanco/zosyn in the PACU per MD request. Leukocytosis present, renal function ok, other labs as above.   Goal of Therapy:  Vancomycin trough level 10-15 mcg/ml  Plan:  -Vancomycin 1000 mg IV q12h -Zosyn 3.375G IV q8h to be infused over 4 hours per MD -Trend WBC, temp, renal function  -Drug levels as indicated   Narda Bonds 05/05/2014,10:48 PM

## 2014-05-05 NOTE — ED Notes (Signed)
Pt presents with swelling to L middle finger, finger noted to be taught red/purple in color from knuckle to finger tip, yellow crust noted to cuticle area. Pt states he smashed finger in garage door 3 years ago, he states he intermittently has clear drainage, this time drainage yellow.

## 2014-05-05 NOTE — Transfer of Care (Signed)
Immediate Anesthesia Transfer of Care Note  Patient: Thomas Wright  Procedure(s) Performed: Procedure(s): IRRIGATION AND DEBRIDEMENT EXTREMITY (Right)  Patient Location: PACU  Anesthesia Type:General  Level of Consciousness: awake, alert , oriented and patient cooperative  Airway & Oxygen Therapy: Patient Spontanous Breathing and Patient connected to nasal cannula oxygen  Post-op Assessment: Report given to PACU RN and Post -op Vital signs reviewed and stable  Post vital signs: Reviewed and stable  Complications: No apparent anesthesia complications

## 2014-05-05 NOTE — ED Provider Notes (Signed)
CSN: [id number]     Arrival date & time 05/05/14  1622 History   First MD Initiated Contact with Patient 05/05/14 1639     Chief Complaint  Patient presents with  . Finger Injury     (Consider location/radiation/quality/duration/timing/severity/associated sxs/prior Treatment) HPI [name] is a 68 year old male past medical history diabetes, hypertension, A. fib, aortic valve replacement with bovine graft in 2008 who presents today with 6 days of pain and swelling to his right middle finger. Patient states the swelling was gradual in onset, began to tip of his finger on Friday and radiated proximally over the past 5 days. He states he "slammed his finger in a car door" 3 years ago, and since then has developed a mild infection in the tip of his right middle finger approximately every 4-5 months. He states he typically drains pus out of this infection and it is self-limited. He reports that on Friday, 6 days ago he developed a similar infection, however he noticed the redness and swelling moving up his finger. He states this has never happened to him before. Mr. Corado reports mild chills, denies fever, nausea, vomiting, chest pain, shortness of breath, dysuria.  Past Medical History  Diagnosis Date  . Diabetes mellitus   . Hypertension   . Atrial fibrillation   . Hypertriglyceridemia   . Afib     Stopped amiodarone 2013-no further episodes of atrial fibrillation. Postoperative 2008  . H/O aortic valve replacement with tissue graft     2008.  . Back pain   . Cardiomyopathy     EF originally 20%-now 45%, no CAD on cath   Past Surgical History  Procedure Laterality Date  . Cardiac surgery  05/2007    Aortic Valve Replacement   Family History  Problem Relation Age of Onset  . Colon cancer Father   . Liver disease Brother   . Heart murmur Child   . Congenital heart disease Other    History  Substance Use Topics  . Smoking status: Never Smoker   . Smokeless tobacco: Never Used  .  Alcohol Use: No    Review of Systems  Constitutional: Positive for chills. Negative for fever and fatigue.  HENT: Negative for trouble swallowing.   Eyes: Negative for visual disturbance.  Respiratory: Negative for shortness of breath.   Cardiovascular: Negative for chest pain.  Gastrointestinal: Negative for nausea, vomiting and abdominal pain.  Genitourinary: Negative for dysuria.  Musculoskeletal: Positive for joint swelling. Negative for neck pain.  Skin: Positive for wound. Negative for rash.  Neurological: Negative for dizziness, weakness and numbness.  Psychiatric/Behavioral: Negative.       Allergies  Review of patient's allergies indicates no known allergies.  Home Medications   Prior to Admission medications   Medication Sig Start Date End Date Taking? Authorizing Provider  aspirin 81 MG tablet Take 81 mg by mouth daily.   Yes Historical Provider, MD  carvedilol (COREG) 25 MG tablet Take 1 tablet (25 mg total) by mouth 2 (two) times daily with a meal. 02/04/14  Yes [name], MD  furosemide (LASIX) 20 MG tablet Take 1 tablet (20 mg total) by mouth daily. 12/15/13  Yes [name], MD  glipiZIDE (GLUCOTROL) 5 MG tablet Take 1 tablet (5 mg total) by mouth daily before breakfast. 12/18/13  Yes [name], MD  ibuprofen (ADVIL,MOTRIN) 200 MG tablet Take 600 mg by mouth 3 (three) times daily as needed for moderate pain.   Yes Historical Provider, MD  losartan (COZAAR) 50  MG tablet Take 1 tablet (50 mg total) by mouth daily. 12/15/13  Yes [name], MD  metFORMIN (GLUCOPHAGE) 1000 MG tablet Take 1,000 mg by mouth 2 (two) times daily with a meal.   Yes Historical Provider, MD   BP 140/67  Pulse 90  Temp(Src) 98.8 F (37.1 C) (Oral)  Resp 20  SpO2 99% Physical Exam  Constitutional: He is oriented to person, place, and time. He appears well-developed and well-nourished. No distress.  HENT:  Head: Normocephalic and atraumatic.  Mouth/Throat: Oropharynx is clear and moist.  No oropharyngeal exudate.  Eyes: Right eye exhibits no discharge. Left eye exhibits no discharge. No scleral icterus.  Neck: Normal range of motion.  Cardiovascular: Normal rate, regular rhythm and normal heart sounds.   No murmur heard. Pulmonary/Chest: Effort normal and breath sounds normal. No respiratory distress.  Abdominal: Soft. There is no tenderness.  Musculoskeletal: Normal range of motion. He exhibits no edema and no tenderness.  Neurological: He is alert and oriented to person, place, and time. No cranial nerve deficit. Coordination normal.  Skin: Skin is warm and dry. No rash noted. He is not diaphoretic.  Right hand: Patient has erythema, swelling noted to right middle finger with mild erythema to right third distal metacarpal. Moderate tenderness noted to the dorsal aspect of finger with range of motion reduced due to swelling. Capillary refill in the right middle finger less than 2 seconds. Radial pulse 2+. Distal sensation intact.  Psychiatric: He has a normal mood and affect.    ED Course  Procedures (including critical care time) Labs Review Labs Reviewed  CBC WITH DIFFERENTIAL - Abnormal; Notable for the following:    WBC 17.1 (*)    HCT 38.9 (*)    Neutro Abs 11.8 (*)    Monocytes Absolute 2.0 (*)    All other components within normal limits  BASIC METABOLIC PANEL - Abnormal; Notable for the following:    Sodium 132 (*)    Glucose, Bld 310 (*)    All other components within normal limits  CBG MONITORING, ED - Abnormal; Notable for the following:    Glucose-Capillary 270 (*)    All other components within normal limits    Imaging Review Dg Finger Middle Right  05/05/2014   CLINICAL DATA:  Right middle finger injury 3 years ago. Persistent pain, swelling and erythema.  EXAM: RIGHT MIDDLE FINGER 2+V  COMPARISON:  None.  FINDINGS: The mineralization and alignment are normal. There is no evidence of acute fracture or dislocation. There is mild interphalangeal joint  space loss, greatest distally. No erosive changes are identified. The soft tissues appear mildly prominent, potentially swollen. There is no evidence of a foreign body or bone destruction.  IMPRESSION: No acute osseous findings demonstrated. Mild interphalangeal degenerative changes with possible soft soft tissue swelling.   Electronically Signed   By: Camie Patience M.D.   On: 05/05/2014 17:33     EKG Interpretation None      MDM   Final diagnoses:  Tenosynovitis of finger   68 year old man [place] past medical history of diabetes presenting with 6 days' worth of swelling, pain, redness to his right middle finger. On exam patient has limited range of motion due to the swelling and pain, with erythema, swelling noted from the distal portion of his middle finger of 2 the distal portion of his third metacarpal on his right hand. Distal sensation intact. Workup to include CBC, BMP, and x-ray. We will also consult hand surgery to see  Mr. Dorris.    Patient's radiograph shows no acute osseous findings demonstrated with mild interphalangeal degenerative changes with possible soft tissue swelling. Patient's CBC returned with leukocytosis of 17.1. We contacted Dr. Burney Gauze with hand surgery who agrees to see the patient.   After evaluating the patient Dr. Burney Gauze wishes to perform I&D on patient's finger tonight in the OR. Patient scheduled for 9:00 in the OR, and is sent by POV to [facility] OR.     Signed,  Dahlia Bailiff, PA-C 9:20 PM    Carrie Mew, PA-C 05/05/14 2120

## 2014-05-05 NOTE — Anesthesia Procedure Notes (Signed)
Procedure Name: LMA Insertion Date/Time: 05/05/2014 9:30 PM Performed by: Hollie Salk Z Pre-anesthesia Checklist: Patient identified, Timeout performed, Emergency Drugs available, Suction available and Patient being monitored Patient Re-evaluated:Patient Re-evaluated prior to inductionOxygen Delivery Method: Circle system utilized Preoxygenation: Pre-oxygenation with 100% oxygen Intubation Type: IV induction Ventilation: Mask ventilation without difficulty LMA: LMA inserted LMA Size: 4.0 Tube type: Oral Number of attempts: 1 Tube secured with: Tape Dental Injury: Teeth and Oropharynx as per pre-operative assessment

## 2014-05-05 NOTE — ED Notes (Signed)
EKG given to EDP,Kohut,MD., for review. 

## 2014-05-05 NOTE — ED Notes (Signed)
MD at bedside. Roel Cluck, hospitalist and [facility], Psychologist, sport and exercise

## 2014-05-05 NOTE — Consult Note (Signed)
Reason for Consult:right long swelling and pain x several days Referring Physician:Douva  ISAAK DELMUNDO is an 68 y.o. male.  HPI: with h/o increased swelling and pain right long finger times several days  Past Medical History  Diagnosis Date  . Diabetes mellitus   . Hypertension   . Atrial fibrillation   . Hypertriglyceridemia   . Afib     Stopped amiodarone 2013-no further episodes of atrial fibrillation. Postoperative 2008  . H/O aortic valve replacement with tissue graft     2008.  . Back pain   . Cardiomyopathy     EF originally 20%-now 45%, no CAD on cath    Past Surgical History  Procedure Laterality Date  . Cardiac surgery  05/2007    Aortic Valve Replacement    Family History  Problem Relation Age of Onset  . Colon cancer Father   . Liver disease Brother   . Heart murmur Child   . Congenital heart disease Other     Social History:  reports that he has never smoked. He has never used smokeless tobacco. He reports that he does not drink alcohol or use illicit drugs.  Allergies: No Known Allergies  Medications: Scheduled:   Results for orders placed during the hospital encounter of 05/05/14 (from the past 48 hour(s))  CBC WITH DIFFERENTIAL     Status: Abnormal   Collection Time    05/05/14  5:34 PM      Result Value Ref Range   WBC 17.1 (*) 4.0 - 10.5 K/uL   RBC 4.77  4.22 - 5.81 MIL/uL   Hemoglobin 13.7  13.0 - 17.0 g/dL   HCT 38.9 (*) 39.0 - 52.0 %   MCV 81.6  78.0 - 100.0 fL   MCH 28.7  26.0 - 34.0 pg   MCHC 35.2  30.0 - 36.0 g/dL   RDW 12.4  11.5 - 15.5 %   Platelets 308  150 - 400 K/uL   Neutrophils Relative % 69  43 - 77 %   Neutro Abs 11.8 (*) 1.7 - 7.7 K/uL   Lymphocytes Relative 18  12 - 46 %   Lymphs Abs 3.2  0.7 - 4.0 K/uL   Monocytes Relative 12  3 - 12 %   Monocytes Absolute 2.0 (*) 0.1 - 1.0 K/uL   Eosinophils Relative 1  0 - 5 %   Eosinophils Absolute 0.2  0.0 - 0.7 K/uL   Basophils Relative 0  0 - 1 %   Basophils Absolute 0.0  0.0 -  0.1 K/uL  BASIC METABOLIC PANEL     Status: Abnormal   Collection Time    05/05/14  5:34 PM      Result Value Ref Range   Sodium 132 (*) 137 - 147 mEq/L   Potassium 5.0  3.7 - 5.3 mEq/L   Chloride 96  96 - 112 mEq/L   CO2 22  19 - 32 mEq/L   Glucose, Bld 310 (*) 70 - 99 mg/dL   BUN 22  6 - 23 mg/dL   Creatinine, Ser 0.79  0.50 - 1.35 mg/dL   Calcium 9.3  8.4 - 10.5 mg/dL   GFR calc non Af Amer >90  >90 mL/min   GFR calc Af Amer >90  >90 mL/min   Comment: (NOTE)     The eGFR has been calculated using the CKD EPI equation.     This calculation has not been validated in all clinical situations.     eGFR's persistently <  90 mL/min signify possible Chronic Kidney     Disease.   Anion gap 14  5 - 15    Dg Finger Middle Right  05/05/2014   CLINICAL DATA:  Right middle finger injury 3 years ago. Persistent pain, swelling and erythema.  EXAM: RIGHT MIDDLE FINGER 2+V  COMPARISON:  None.  FINDINGS: The mineralization and alignment are normal. There is no evidence of acute fracture or dislocation. There is mild interphalangeal joint space loss, greatest distally. No erosive changes are identified. The soft tissues appear mildly prominent, potentially swollen. There is no evidence of a foreign body or bone destruction.  IMPRESSION: No acute osseous findings demonstrated. Mild interphalangeal degenerative changes with possible soft soft tissue swelling.   Electronically Signed   By: Camie Patience M.D.   On: 05/05/2014 17:33    Review of Systems  All other systems reviewed and are negative.  Blood pressure 131/59, pulse 85, temperature 98.9 F (37.2 C), temperature source Oral, resp. rate 18, SpO2 99.00%. Physical Exam  Constitutional: He is oriented to person, place, and time. He appears well-developed and well-nourished.  HENT:  Head: Normocephalic and atraumatic.  Cardiovascular: Normal rate.   Respiratory: Effort normal.  Musculoskeletal:       Right hand: He exhibits swelling.  Right long  finger dorsal swelling and erythema from dip to mcp   No involvement of flexor side  Neurological: He is alert and oriented to person, place, and time.  Skin: Skin is warm. There is erythema.  Psychiatric: He has a normal mood and affect. His behavior is normal. Judgment and thought content normal.    Assessment/Plan: As above  Plan to admit to medicine and emergent I and D this pm  Gilberto Streck A 05/05/2014, 8:05 PM

## 2014-05-05 NOTE — ED Notes (Signed)
Pt has hx of injury to right middle finger.  Pt states every once in a while, finger becomes infected.  Starts at nail bed and works down.  Pt states yellow pus drainage.  Finger red and swollen.

## 2014-05-05 NOTE — Anesthesia Preprocedure Evaluation (Addendum)
Anesthesia Evaluation  Patient identified by MRN, date of birth, ID band Patient awake    Reviewed: Allergy & Precautions, H&P , NPO status   Airway Mallampati: II TM Distance: >3 FB Neck ROM: Full    Dental  (+) Teeth Intact, Dental Advisory Given   Pulmonary  breath sounds clear to auscultation        Cardiovascular hypertension, Rhythm:Regular Rate:Normal     Neuro/Psych    GI/Hepatic   Endo/Other  diabetes  Renal/GU      Musculoskeletal   Abdominal   Peds  Hematology   Anesthesia Other Findings   Reproductive/Obstetrics                          Anesthesia Physical Anesthesia Plan  ASA: III and emergent  Anesthesia Plan: General   Post-op Pain Management:    Induction: Intravenous  Airway Management Planned: LMA  Additional Equipment:   Intra-op Plan:   Post-operative Plan:   Informed Consent: I have reviewed the patients History and Physical, chart, labs and discussed the procedure including the risks, benefits and alternatives for the proposed anesthesia with the patient or authorized representative who has indicated his/her understanding and acceptance.   Dental advisory given  Plan Discussed with: CRNA and Anesthesiologist  Anesthesia Plan Comments: (Infection R. Ring finger  Type 2 DM glucose 310 Hypertension S/P AVR with tissue valve 05/28/07, valve functioning well, EF 45%  Plan GA with LMA  Roberts Gaudy)       Anesthesia Quick Evaluation

## 2014-05-06 ENCOUNTER — Encounter (HOSPITAL_COMMUNITY): Payer: Self-pay | Admitting: Orthopedic Surgery

## 2014-05-06 DIAGNOSIS — M65849 Other synovitis and tenosynovitis, unspecified hand: Secondary | ICD-10-CM

## 2014-05-06 DIAGNOSIS — M65839 Other synovitis and tenosynovitis, unspecified forearm: Secondary | ICD-10-CM

## 2014-05-06 LAB — COMPREHENSIVE METABOLIC PANEL
ALT: 15 U/L (ref 0–53)
AST: 15 U/L (ref 0–37)
Albumin: 3.3 g/dL — ABNORMAL LOW (ref 3.5–5.2)
Alkaline Phosphatase: 85 U/L (ref 39–117)
Anion gap: 11 (ref 5–15)
BUN: 19 mg/dL (ref 6–23)
CALCIUM: 8.6 mg/dL (ref 8.4–10.5)
CO2: 25 mEq/L (ref 19–32)
CREATININE: 0.65 mg/dL (ref 0.50–1.35)
Chloride: 100 mEq/L (ref 96–112)
GLUCOSE: 211 mg/dL — AB (ref 70–99)
Potassium: 4 mEq/L (ref 3.7–5.3)
Sodium: 136 mEq/L — ABNORMAL LOW (ref 137–147)
Total Bilirubin: 0.4 mg/dL (ref 0.3–1.2)
Total Protein: 6.7 g/dL (ref 6.0–8.3)

## 2014-05-06 LAB — GLUCOSE, CAPILLARY
GLUCOSE-CAPILLARY: 201 mg/dL — AB (ref 70–99)
GLUCOSE-CAPILLARY: 244 mg/dL — AB (ref 70–99)
Glucose-Capillary: 197 mg/dL — ABNORMAL HIGH (ref 70–99)
Glucose-Capillary: 180 mg/dL — ABNORMAL HIGH (ref 70–99)
Glucose-Capillary: 274 mg/dL — ABNORMAL HIGH (ref 70–99)

## 2014-05-06 LAB — PHOSPHORUS: Phosphorus: 3.6 mg/dL (ref 2.3–4.6)

## 2014-05-06 LAB — CBC
HCT: 36 % — ABNORMAL LOW (ref 39.0–52.0)
Hemoglobin: 12.6 g/dL — ABNORMAL LOW (ref 13.0–17.0)
MCH: 29 pg (ref 26.0–34.0)
MCHC: 35 g/dL (ref 30.0–36.0)
MCV: 82.8 fL (ref 78.0–100.0)
Platelets: 270 10*3/uL (ref 150–400)
RBC: 4.35 MIL/uL (ref 4.22–5.81)
RDW: 12.6 % (ref 11.5–15.5)
WBC: 10.9 10*3/uL — AB (ref 4.0–10.5)

## 2014-05-06 LAB — MAGNESIUM: Magnesium: 1.8 mg/dL (ref 1.5–2.5)

## 2014-05-06 LAB — TSH: TSH: 3.06 u[IU]/mL (ref 0.350–4.500)

## 2014-05-06 LAB — HEMOGLOBIN A1C
HEMOGLOBIN A1C: 8.2 % — AB (ref <5.7)
Mean Plasma Glucose: 189 mg/dL — ABNORMAL HIGH (ref <117)

## 2014-05-06 MED ORDER — ACETAMINOPHEN 325 MG PO TABS: 650.0000 mg | ORAL_TABLET | Freq: Four times a day (QID) | ORAL | Status: DC | PRN

## 2014-05-06 MED ORDER — CARVEDILOL 25 MG PO TABS
25.0000 mg | ORAL_TABLET | Freq: Two times a day (BID) | ORAL | Status: DC
  Administered 2014-05-06 (×2): 25 mg via ORAL
  Filled 2014-05-06 (×4): qty 1

## 2014-05-06 MED ORDER — DOCUSATE SODIUM 100 MG PO CAPS
100.0000 mg | ORAL_CAPSULE | Freq: Two times a day (BID) | ORAL | Status: DC
Start: 2014-05-06 — End: 2014-05-06
  Administered 2014-05-06: 100 mg via ORAL
  Filled 2014-05-06: qty 1

## 2014-05-06 MED ORDER — HYDROCODONE-ACETAMINOPHEN 5-325 MG PO TABS
1.0000 | ORAL_TABLET | ORAL | Status: DC | PRN
  Administered 2014-05-06 (×3): 2 via ORAL
  Filled 2014-05-06 (×3): qty 2

## 2014-05-06 MED ORDER — ASPIRIN EC 81 MG PO TBEC
81.0000 mg | DELAYED_RELEASE_TABLET | Freq: Every day | ORAL | Status: DC
  Administered 2014-05-06: 81 mg via ORAL
  Filled 2014-05-06: qty 1

## 2014-05-06 MED ORDER — HYDROCODONE-ACETAMINOPHEN 5-325 MG PO TABS: 1.0000 | ORAL_TABLET | ORAL | Status: DC | PRN

## 2014-05-06 MED ORDER — ACETAMINOPHEN 650 MG RE SUPP: 650.0000 mg | Freq: Four times a day (QID) | RECTAL | Status: DC | PRN

## 2014-05-06 MED ORDER — SODIUM CHLORIDE 0.9 % IV SOLN: 250.0000 mL | INTRAVENOUS | Status: DC | PRN

## 2014-05-06 MED ORDER — GLIPIZIDE 5 MG PO TABS
5.0000 mg | ORAL_TABLET | Freq: Every day | ORAL | Status: DC
Start: 2014-05-06 — End: 2014-05-06
  Administered 2014-05-06: 5 mg via ORAL
  Filled 2014-05-06 (×2): qty 1

## 2014-05-06 MED ORDER — ONDANSETRON HCL 4 MG PO TABS: 4.0000 mg | ORAL_TABLET | Freq: Four times a day (QID) | ORAL | Status: DC | PRN

## 2014-05-06 MED ORDER — DOXYCYCLINE HYCLATE 50 MG PO CAPS: 100.0000 mg | ORAL_CAPSULE | Freq: Two times a day (BID) | ORAL | Status: AC

## 2014-05-06 MED ORDER — SODIUM CHLORIDE 0.9 % IJ SOLN: 3.0000 mL | Freq: Two times a day (BID) | INTRAMUSCULAR | Status: DC

## 2014-05-06 MED ORDER — ONDANSETRON HCL 4 MG/2ML IJ SOLN: 4.0000 mg | Freq: Four times a day (QID) | INTRAMUSCULAR | Status: DC | PRN

## 2014-05-06 MED ORDER — AMOXICILLIN-POT CLAVULANATE 875-125 MG PO TABS
1.0000 | ORAL_TABLET | Freq: Two times a day (BID) | ORAL | Status: AC
Start: 2014-05-06 — End: 2014-05-16

## 2014-05-06 MED ORDER — LOSARTAN POTASSIUM 50 MG PO TABS
50.0000 mg | ORAL_TABLET | Freq: Every day | ORAL | Status: DC
  Administered 2014-05-06: 50 mg via ORAL
  Filled 2014-05-06: qty 1

## 2014-05-06 MED ORDER — SODIUM CHLORIDE 0.9 % IV SOLN
INTRAVENOUS | Status: AC
  Administered 2014-05-06: 01:00:00 via INTRAVENOUS

## 2014-05-06 MED ORDER — SODIUM CHLORIDE 0.9 % IJ SOLN: 3.0000 mL | INTRAMUSCULAR | Status: DC | PRN

## 2014-05-06 MED ORDER — MORPHINE SULFATE 2 MG/ML IJ SOLN
2.0000 mg | INTRAMUSCULAR | Status: DC | PRN
  Administered 2014-05-06: 4 mg via INTRAVENOUS
  Administered 2014-05-06: 2 mg via INTRAVENOUS
  Filled 2014-05-06: qty 1
  Filled 2014-05-06 (×2): qty 2

## 2014-05-06 MED ORDER — FUROSEMIDE 20 MG PO TABS
20.0000 mg | ORAL_TABLET | Freq: Every day | ORAL | Status: DC
  Administered 2014-05-06: 20 mg via ORAL
  Filled 2014-05-06: qty 1

## 2014-05-06 NOTE — Progress Notes (Signed)
Patients WBC almost normalized this AM  Gram stain c/w probable Staph infection  Should be ok to d/c on PO abx to cover MRSA with followup in my office this afternoon or tomorrow for whirlpool and dressing change

## 2014-05-06 NOTE — Progress Notes (Signed)
AVS discharge instructions were given and went over with patient. Patient was given prescription for vicodin to take to his pharmacy. Patient was also reminded to pick up his prescriptions of doxcycline and augmentin from his pharmacy. Patient stated that he did not have any questions. Staff assisted patient to his transportation.

## 2014-05-06 NOTE — Discharge Summary (Signed)
Physician Discharge Summary  Thomas Wright MRN: [id number] DOB/AGE: [id number] y.o.  PCP: [name], MD   Admit date: 05/08/2014 Discharge date: 05/06/2014  Discharge Diagnoses:   Right long finger extensor sheath infection Status post Incision and drainage of above    Cardiomyopathy   H/O aortic valve replacement with tissue graft   Afib   Hypertension   Tenosynovitis of finger   Tenosynovitis   Cellulitis and abscess of digit   Diabetes mellitus     Medication List    STOP taking these medications       ibuprofen 200 MG tablet  Commonly known as:  ADVIL,MOTRIN      TAKE these medications       amoxicillin-clavulanate 875-125 MG per tablet  Commonly known as:  AUGMENTIN  Take 1 tablet by mouth 2 (two) times daily.     aspirin 81 MG tablet  Take 81 mg by mouth daily.     carvedilol 25 MG tablet  Commonly known as:  COREG  Take 1 tablet (25 mg total) by mouth 2 (two) times daily with a meal.     doxycycline 50 MG capsule  Commonly known as:  VIBRAMYCIN  Take 2 capsules (100 mg total) by mouth 2 (two) times daily.     furosemide 20 MG tablet  Commonly known as:  LASIX  Take 1 tablet (20 mg total) by mouth daily.     glipiZIDE 5 MG tablet  Commonly known as:  GLUCOTROL  Take 1 tablet (5 mg total) by mouth daily before breakfast.     HYDROcodone-acetaminophen 5-325 MG per tablet  Commonly known as:  NORCO/VICODIN  Take 1-2 tablets by mouth every 4 (four) hours as needed for moderate pain.     losartan 50 MG tablet  Commonly known as:  COZAAR  Take 1 tablet (50 mg total) by mouth daily.     metFORMIN 1000 MG tablet  Commonly known as:  GLUCOPHAGE  Take 1,000 mg by mouth 2 (two) times daily with a meal.        Discharge Condition: Stable  Disposition: 07-Left Against Medical Advice   Consults:Thomas Burney Gauze, MD   Significant Diagnostic Studies: Dg Finger Middle Right  05-08-14   CLINICAL DATA:  Right middle finger  injury 3 years ago. Persistent pain, swelling and erythema.  EXAM: RIGHT MIDDLE FINGER 2+V  COMPARISON:  None.  FINDINGS: The mineralization and alignment are normal. There is no evidence of acute fracture or dislocation. There is mild interphalangeal joint space loss, greatest distally. No erosive changes are identified. The soft tissues appear mildly prominent, potentially swollen. There is no evidence of a foreign body or bone destruction.  IMPRESSION: No acute osseous findings demonstrated. Mild interphalangeal degenerative changes with possible soft soft tissue swelling.   Electronically Signed   By: Thomas Patience M.D.   On: 2014/05/08 17:33     Microbiology: Recent Results (from the past 240 hour(s))  GRAM STAIN     Status: None   Collection Time    May 08, 2014  9:34 PM      Result Value Ref Range Status   Specimen Description ABSCESS FINGER RIGHT   Final   Special Requests NONE   Final   Gram Stain     Final   Value: MODERATE WBC PRESENT,BOTH PMN AND MONONUCLEAR     RARE GRAM POSITIVE COCCI IN PAIRS     Gram Stain Report Called to,Read Back By and Verified With: DR Burney Wright [phone]  [facility] M   Report Status 05/05/2014 FINAL   Final  CULTURE, ROUTINE-ABSCESS     Status: None   Collection Time    05/05/14  9:34 PM      Result Value Ref Range Status   Specimen Description ABSCESS FINGER RIGHT   Final   Special Requests NONE   Final   Gram Stain     Final   Value: MODERATE WBC PRESENT,BOTH PMN AND MONONUCLEAR     RARE GRAM POSITIVE COCCI     IN PAIRS Gram Stain Report Called to,Read Back By and Verified With: Gram Stain Report Called to,Read Back By and Verified With: DR Burney Wright ON 05/04/14 2240 BY [facility] M.     Performed at [facility] PENDING   Incomplete   Report Status PENDING   Incomplete  ANAEROBIC CULTURE     Status: None   Collection Time    05/05/14  9:34 PM      Result Value Ref Range Status   Specimen Description ABSCESS FINGER RIGHT   Final    Special Requests NONE   Final   Gram Stain     Final   Value: MODERATE WBC PRESENT,BOTH PMN AND MONONUCLEAR     RARE GRAM POSITIVE COCCI     IN PAIRS Gram Stain Report Called to,Read Back By and Verified With: Gram Stain Report Called to,Read Back By and Verified With: DR Burney Wright ON 05/05/14 2240 BY [facility] M. Performed at [facility]     Performed at [facility]   Culture     Final   Value: NO ANAEROBES ISOLATED; CULTURE IN PROGRESS FOR 5 DAYS     Performed at [facility]   Report Status PENDING   Incomplete     Labs: Results for orders placed during the hospital encounter of 05/05/14 (from the past 48 hour(s))  HEMOGLOBIN A1C     Status: Abnormal   Collection Time    05/05/14 12:01 AM      Result Value Ref Range   Hemoglobin A1C 8.2 (*) <5.7 %   Comment: (NOTE)                                                                               According to the ADA Clinical Practice Recommendations for 2011, when     HbA1c is used as a screening test:      >=6.5%   Diagnostic of Diabetes Mellitus               (if abnormal result is confirmed)     5.7-6.4%   Increased risk of developing Diabetes Mellitus     References:Diagnosis and Classification of Diabetes Mellitus,Diabetes     DGLO,7564,33(IRJJO 1):S62-S69 and Standards of Medical Care in             Diabetes - 2011,Diabetes Care,2011,34 (Suppl 1):S11-S61.   Mean Plasma Glucose 189 (*) <117 mg/dL   Comment: Performed at [facility]  CBC WITH DIFFERENTIAL     Status: Abnormal   Collection Time    05/05/14  5:34 PM      Result Value Ref Range   WBC 17.1 (*) 4.0 -  10.5 K/uL   RBC 4.77  4.22 - 5.81 MIL/uL   Hemoglobin 13.7  13.0 - 17.0 g/dL   HCT 38.9 (*) 39.0 - 52.0 %   MCV 81.6  78.0 - 100.0 fL   MCH 28.7  26.0 - 34.0 pg   MCHC 35.2  30.0 - 36.0 g/dL   RDW 12.4  11.5 - 15.5 %   Platelets 308  150 - 400 K/uL   Neutrophils Relative % 69  43 - 77 %   Neutro Abs 11.8 (*) 1.7 - 7.7 K/uL    Lymphocytes Relative 18  12 - 46 %   Lymphs Abs 3.2  0.7 - 4.0 K/uL   Monocytes Relative 12  3 - 12 %   Monocytes Absolute 2.0 (*) 0.1 - 1.0 K/uL   Eosinophils Relative 1  0 - 5 %   Eosinophils Absolute 0.2  0.0 - 0.7 K/uL   Basophils Relative 0  0 - 1 %   Basophils Absolute 0.0  0.0 - 0.1 K/uL  BASIC METABOLIC PANEL     Status: Abnormal   Collection Time    05/05/14  5:34 PM      Result Value Ref Range   Sodium 132 (*) 137 - 147 mEq/L   Potassium 5.0  3.7 - 5.3 mEq/L   Chloride 96  96 - 112 mEq/L   CO2 22  19 - 32 mEq/L   Glucose, Bld 310 (*) 70 - 99 mg/dL   BUN 22  6 - 23 mg/dL   Creatinine, Ser 0.79  0.50 - 1.35 mg/dL   Calcium 9.3  8.4 - 10.5 mg/dL   GFR calc non Af Amer >90  >90 mL/min   GFR calc Af Amer >90  >90 mL/min   Comment: (NOTE)     The eGFR has been calculated using the CKD EPI equation.     This calculation has not been validated in all clinical situations.     eGFR's persistently <90 mL/min signify possible Chronic Kidney     Disease.   Anion gap 14  5 - 15  CBG MONITORING, ED     Status: Abnormal   Collection Time    05/05/14  8:07 PM      Result Value Ref Range   Glucose-Capillary 270 (*) 70 - 99 mg/dL  GRAM STAIN     Status: None   Collection Time    05/05/14  9:34 PM      Result Value Ref Range   Specimen Description ABSCESS FINGER RIGHT     Special Requests NONE     Gram Stain       Value: MODERATE WBC PRESENT,BOTH PMN AND MONONUCLEAR     RARE GRAM POSITIVE COCCI IN PAIRS     Gram Stain Report Called to,Read Back By and Verified With: DR Burney Wright [phone] [facility] M   Report Status 05/05/2014 FINAL    CULTURE, ROUTINE-ABSCESS     Status: None   Collection Time    05/05/14  9:34 PM      Result Value Ref Range   Specimen Description ABSCESS FINGER RIGHT     Special Requests NONE     Gram Stain       Value: MODERATE WBC PRESENT,BOTH PMN AND MONONUCLEAR     RARE GRAM POSITIVE COCCI     IN PAIRS Gram Stain Report Called to,Read Back By and  Verified With: Gram Stain Report Called to,Read Back By and Verified With: DR Burney Wright ON 05/04/14 2240  BY [facility] M.     Performed at [facility] PENDING     Report Status PENDING    ANAEROBIC CULTURE     Status: None   Collection Time    05/05/14  9:34 PM      Result Value Ref Range   Specimen Description ABSCESS FINGER RIGHT     Special Requests NONE     Gram Stain       Value: MODERATE WBC PRESENT,BOTH PMN AND MONONUCLEAR     RARE GRAM POSITIVE COCCI     IN PAIRS Gram Stain Report Called to,Read Back By and Verified With: Gram Stain Report Called to,Read Back By and Verified With: DR Burney Wright ON 05/05/14 2240 BY [facility] M. Performed at [facility]     Performed at [facility]   Culture       Value: NO ANAEROBES ISOLATED; CULTURE IN PROGRESS FOR 5 DAYS     Performed at [facility]   Report Status PENDING    GLUCOSE, CAPILLARY     Status: Abnormal   Collection Time    05/05/14 10:35 PM      Result Value Ref Range   Glucose-Capillary 197 (*) 70 - 99 mg/dL  GLUCOSE, CAPILLARY     Status: Abnormal   Collection Time    05/06/14  3:41 AM      Result Value Ref Range   Glucose-Capillary 201 (*) 70 - 99 mg/dL  MAGNESIUM     Status: None   Collection Time    05/06/14  4:09 AM      Result Value Ref Range   Magnesium 1.8  1.5 - 2.5 mg/dL  PHOSPHORUS     Status: None   Collection Time    05/06/14  4:09 AM      Result Value Ref Range   Phosphorus 3.6  2.3 - 4.6 mg/dL  COMPREHENSIVE METABOLIC PANEL     Status: Abnormal   Collection Time    05/06/14  4:09 AM      Result Value Ref Range   Sodium 136 (*) 137 - 147 mEq/L   Potassium 4.0  3.7 - 5.3 mEq/L   Chloride 100  96 - 112 mEq/L   CO2 25  19 - 32 mEq/L   Glucose, Bld 211 (*) 70 - 99 mg/dL   BUN 19  6 - 23 mg/dL   Creatinine, Ser 0.65  0.50 - 1.35 mg/dL   Calcium 8.6  8.4 - 10.5 mg/dL   Total Protein 6.7  6.0 - 8.3 g/dL   Albumin 3.3 (*) 3.5 - 5.2 g/dL   AST 15  0 - 37 U/L   ALT  15  0 - 53 U/L   Alkaline Phosphatase 85  39 - 117 U/L   Total Bilirubin 0.4  0.3 - 1.2 mg/dL   GFR calc non Af Amer >90  >90 mL/min   GFR calc Af Amer >90  >90 mL/min   Comment: (NOTE)     The eGFR has been calculated using the CKD EPI equation.     This calculation has not been validated in all clinical situations.     eGFR's persistently <90 mL/min signify possible Chronic Kidney     Disease.   Anion gap 11  5 - 15  CBC     Status: Abnormal   Collection Time    05/06/14  4:09 AM      Result Value Ref Range   WBC 10.9 (*)  4.0 - 10.5 K/uL   RBC 4.35  4.22 - 5.81 MIL/uL   Hemoglobin 12.6 (*) 13.0 - 17.0 g/dL   HCT 36.0 (*) 39.0 - 52.0 %   MCV 82.8  78.0 - 100.0 fL   MCH 29.0  26.0 - 34.0 pg   MCHC 35.0  30.0 - 36.0 g/dL   RDW 12.6  11.5 - 15.5 %   Platelets 270  150 - 400 K/uL  TSH     Status: None   Collection Time    05/06/14  4:40 AM      Result Value Ref Range   TSH 3.060  0.350 - 4.500 uIU/mL  GLUCOSE, CAPILLARY     Status: Abnormal   Collection Time    05/06/14  7:51 AM      Result Value Ref Range   Glucose-Capillary 180 (*) 70 - 99 mg/dL     HPI   Thomas Wright is a 68 y.o. male has a past medical history of Diabetes mellitus; Hypertension; Atrial fibrillation; Hypertriglyceridemia; Afib; H/O aortic valve replacement with tissue graft; Back pain; and Cardiomyopathy.  Presented with  swelling around the nailbed. He tried to use home remedies such as Epson salt baths with iodine. Despite this the finger started to swell and turn red. Patient endorses chills but no fever. He presented to ER and was found to have elevated WBC up to 17. IN ER patient was noted to have elevated BG up to 310 he states he has not been taking his medications today. Patient is sp bioprosthetic aortic valve repair in 2008. Patient had original EF of 20% BUT THIS HAS IMPROVED UP TO 45%. He also has hx of A.fib sp successful cardioversion in 2009 have been off Amiodarone since 2013 not  anticoagulated.  Hand surgeon Dr. Burney Wright was consulted.   TRH was asked to admit after patient have been surgically Treated. With IV antibiotics to be started after intraoperative cultures were obtained.   HOSPITAL COURSE:   Right long finger extensor sheath infection.  Status post PROCEDURE: Incision and drainage on 8/19 Patient treated with vancomycin and Zosyn Transitioned to doxycycline and on medicine for 10 days Patient to followup with Dr.Matthew Burney Gauze, MD   Hypertension - continue home medications  . Cardiomyopathy - restart Lasix tomorrow and avoid fluid overload  . Afib currently in sinus rhythm has been off amiodarone since 2013  . Diabetes mellitus - with hyperglycemia due to not taking his medications. Resume glipizide and metformin Bovine Aortic valve replacement- no indication for anticoagulation.   Discharge Exam:  Blood pressure 142/43, pulse 86, temperature 98 F (36.7 C), temperature source Oral, resp. rate 17, SpO2 96.00%. 1. General: in No Acute distress  2. Psychological: Alert and Oriented  3. Head/ENT: Moist Mucous Membranes  Head Non traumatic, neck supple  Normal Dentition  4. SKIN: normal Skin turgor, Skin clean Dry and intact no rash, swelling of right middle finger  5. Heart: Regular rate and rhythm systolic Murmur noted,no Rub or gallop  6. Lungs: Clear to auscultation bilaterally, no wheezes or crackles  7. Abdomen: Soft, non-tender, Non distended  8. Lower extremities: no clubbing, cyanosis, or edema  9. Neurologically Grossly intact, moving all 4 extremities equally  10. MSK: Normal range of motion           Follow-up Information   Follow up with [facility] A, MD. Schedule an appointment as soon as possible for a visit in 1 day.   Specialty:  Orthopedic Surgery  Contact information:   [place] [address] [phone]       Follow up with [name], MD. Schedule an appointment as soon as possible  for a visit in 1 week.   Specialty:  Family Medicine   Contact information:   [place] [address]       Signed: Reyne Dumas 05/06/2014, 11:38 AM

## 2014-05-06 NOTE — Anesthesia Postprocedure Evaluation (Signed)
  Anesthesia Post-op Note  Patient: Thomas Wright  Procedure(s) Performed: Procedure(s): IRRIGATION AND DEBRIDEMENT EXTREMITY (Right)  Patient Location: PACU  Anesthesia Type:General  Level of Consciousness: awake, alert  and oriented  Airway and Oxygen Therapy: Patient Spontanous Breathing and Patient connected to nasal cannula oxygen  Post-op Pain: mild  Post-op Assessment: Post-op Vital signs reviewed, Patient's Cardiovascular Status Stable, Respiratory Function Stable, Patent Airway and Pain level controlled  Post-op Vital Signs: stable  Last Vitals:  Filed Vitals:   05/05/14 2330  BP: 152/66  Pulse: 87  Temp: 37.1 C  Resp: 16    Complications: No apparent anesthesia complications

## 2014-05-08 LAB — CULTURE, ROUTINE-ABSCESS

## 2014-05-09 NOTE — ED Provider Notes (Signed)
Medical screening examination/treatment/procedure(s) were conducted as a shared visit with non-physician practitioner(s) and myself.  I personally evaluated the patient during the encounter.   EKG Interpretation   Date/Time:  Wednesday May 05 2014 19:38:04 EDT Ventricular Rate:  86 PR Interval:  205 QRS Duration: 119 QT Interval:  397 QTC Calculation: 475 R Axis:   -23 Text Interpretation:  Sinus rhythm Atrial premature complex LVH with  secondary repolarization abnormality Anterior Q waves, possibly due to LVH  Borderline ST elevation, inferior leads ED PHYSICIAN INTERPRETATION  AVAILABLE IN [facility] Confirmed by TEST, Record ([id number]) on  05/07/2014 8:20:09 AM        Houston Siren III, MD 05/09/14 [phone]

## 2014-05-09 NOTE — ED Provider Notes (Signed)
Medical screening examination/treatment/procedure(s) were conducted as a shared visit with non-physician practitioner(s) and myself.  I personally evaluated the patient during the encounter.   EKG Interpretation   Date/Time:  Wednesday May 05 2014 19:38:04 EDT Ventricular Rate:  86 PR Interval:  205 QRS Duration: 119 QT Interval:  397 QTC Calculation: 475 R Axis:   -23 Text Interpretation:  Sinus rhythm Atrial premature complex LVH with  secondary repolarization abnormality Anterior Q waves, possibly due to LVH  Borderline ST elevation, inferior leads ED PHYSICIAN INTERPRETATION  AVAILABLE IN [facility] Confirmed by TEST, Record ([id number]) on  05/07/2014 8:20:09 AM      68 yo male with gradually worsening pain and swelling of right long finger.  On exam, well appearing, nontoxic, not distressed, normal respiratory effort, normal perfusion, right middle finger is swollen, red, tender.  Plan hand consult.    Clinical Impression: 1. Tenosynovitis of finger      Houston Siren III, MD 05/09/14 [phone]

## 2014-05-10 LAB — ANAEROBIC CULTURE

## 2014-07-21 ENCOUNTER — Inpatient Hospital Stay (HOSPITAL_COMMUNITY)
Admission: EM | Admit: 2014-07-21 | Discharge: 2014-08-07 | DRG: 987 | Disposition: A | Discharge status: Home Health | Payer: Medicare HMO | Attending: Internal Medicine | Admitting: Internal Medicine

## 2014-07-21 ENCOUNTER — Emergency Department (HOSPITAL_COMMUNITY): Payer: Medicare HMO

## 2014-07-21 ENCOUNTER — Other Ambulatory Visit: Payer: Self-pay

## 2014-07-21 ENCOUNTER — Encounter (HOSPITAL_COMMUNITY): Payer: Self-pay | Admitting: *Deleted

## 2014-07-21 DIAGNOSIS — Z952 Presence of prosthetic heart valve: Secondary | ICD-10-CM

## 2014-07-21 DIAGNOSIS — I509 Heart failure, unspecified: Secondary | ICD-10-CM | POA: Diagnosis present

## 2014-07-21 DIAGNOSIS — M659 Synovitis and tenosynovitis, unspecified: Secondary | ICD-10-CM | POA: Diagnosis present

## 2014-07-21 DIAGNOSIS — R0902 Hypoxemia: Secondary | ICD-10-CM

## 2014-07-21 DIAGNOSIS — I513 Intracardiac thrombosis, not elsewhere classified: Secondary | ICD-10-CM | POA: Diagnosis present

## 2014-07-21 DIAGNOSIS — J9621 Acute and chronic respiratory failure with hypoxia: Secondary | ICD-10-CM | POA: Insufficient documentation

## 2014-07-21 DIAGNOSIS — I4892 Unspecified atrial flutter: Secondary | ICD-10-CM | POA: Diagnosis not present

## 2014-07-21 DIAGNOSIS — I10 Essential (primary) hypertension: Secondary | ICD-10-CM | POA: Diagnosis not present

## 2014-07-21 DIAGNOSIS — K649 Unspecified hemorrhoids: Secondary | ICD-10-CM | POA: Diagnosis not present

## 2014-07-21 DIAGNOSIS — R9431 Abnormal electrocardiogram [ECG] [EKG]: Secondary | ICD-10-CM | POA: Insufficient documentation

## 2014-07-21 DIAGNOSIS — E876 Hypokalemia: Secondary | ICD-10-CM | POA: Diagnosis not present

## 2014-07-21 DIAGNOSIS — R0602 Shortness of breath: Secondary | ICD-10-CM

## 2014-07-21 DIAGNOSIS — E860 Dehydration: Secondary | ICD-10-CM | POA: Diagnosis not present

## 2014-07-21 DIAGNOSIS — E781 Pure hyperglyceridemia: Secondary | ICD-10-CM | POA: Diagnosis present

## 2014-07-21 DIAGNOSIS — Z9119 Patient's noncompliance with other medical treatment and regimen: Secondary | ICD-10-CM | POA: Diagnosis not present

## 2014-07-21 DIAGNOSIS — R32 Unspecified urinary incontinence: Secondary | ICD-10-CM | POA: Diagnosis not present

## 2014-07-21 DIAGNOSIS — E0810 Diabetes mellitus due to underlying condition with ketoacidosis without coma: Secondary | ICD-10-CM

## 2014-07-21 DIAGNOSIS — E1110 Type 2 diabetes mellitus with ketoacidosis without coma: Secondary | ICD-10-CM | POA: Diagnosis present

## 2014-07-21 DIAGNOSIS — I5023 Acute on chronic systolic (congestive) heart failure: Secondary | ICD-10-CM | POA: Diagnosis not present

## 2014-07-21 DIAGNOSIS — Z7982 Long term (current) use of aspirin: Secondary | ICD-10-CM | POA: Diagnosis not present

## 2014-07-21 DIAGNOSIS — Z91119 Patient's noncompliance with dietary regimen due to unspecified reason: Secondary | ICD-10-CM | POA: Insufficient documentation

## 2014-07-21 DIAGNOSIS — D72829 Elevated white blood cell count, unspecified: Secondary | ICD-10-CM | POA: Diagnosis not present

## 2014-07-21 DIAGNOSIS — E1310 Other specified diabetes mellitus with ketoacidosis without coma: Secondary | ICD-10-CM | POA: Diagnosis not present

## 2014-07-21 DIAGNOSIS — R079 Chest pain, unspecified: Secondary | ICD-10-CM

## 2014-07-21 DIAGNOSIS — E119 Type 2 diabetes mellitus without complications: Secondary | ICD-10-CM

## 2014-07-21 DIAGNOSIS — Z79891 Long term (current) use of opiate analgesic: Secondary | ICD-10-CM

## 2014-07-21 DIAGNOSIS — Z9111 Patient's noncompliance with dietary regimen: Secondary | ICD-10-CM | POA: Diagnosis present

## 2014-07-21 DIAGNOSIS — E871 Hypo-osmolality and hyponatremia: Secondary | ICD-10-CM | POA: Diagnosis not present

## 2014-07-21 DIAGNOSIS — Z9114 Patient's other noncompliance with medication regimen: Secondary | ICD-10-CM | POA: Diagnosis not present

## 2014-07-21 DIAGNOSIS — Z954 Presence of other heart-valve replacement: Secondary | ICD-10-CM

## 2014-07-21 DIAGNOSIS — K611 Rectal abscess: Secondary | ICD-10-CM | POA: Diagnosis not present

## 2014-07-21 DIAGNOSIS — I480 Paroxysmal atrial fibrillation: Secondary | ICD-10-CM | POA: Diagnosis present

## 2014-07-21 DIAGNOSIS — I429 Cardiomyopathy, unspecified: Secondary | ICD-10-CM | POA: Diagnosis not present

## 2014-07-21 DIAGNOSIS — J811 Chronic pulmonary edema: Secondary | ICD-10-CM

## 2014-07-21 DIAGNOSIS — Z79899 Other long term (current) drug therapy: Secondary | ICD-10-CM | POA: Diagnosis not present

## 2014-07-21 DIAGNOSIS — J810 Acute pulmonary edema: Secondary | ICD-10-CM | POA: Insufficient documentation

## 2014-07-21 DIAGNOSIS — Z91199 Patient's noncompliance with other medical treatment and regimen due to unspecified reason: Secondary | ICD-10-CM

## 2014-07-21 DIAGNOSIS — Z89511 Acquired absence of right leg below knee: Secondary | ICD-10-CM | POA: Diagnosis not present

## 2014-07-21 DIAGNOSIS — I209 Angina pectoris, unspecified: Secondary | ICD-10-CM

## 2014-07-21 DIAGNOSIS — E162 Hypoglycemia, unspecified: Secondary | ICD-10-CM | POA: Insufficient documentation

## 2014-07-21 DIAGNOSIS — I340 Nonrheumatic mitral (valve) insufficiency: Secondary | ICD-10-CM | POA: Diagnosis present

## 2014-07-21 DIAGNOSIS — R0600 Dyspnea, unspecified: Secondary | ICD-10-CM | POA: Diagnosis not present

## 2014-07-21 DIAGNOSIS — J9601 Acute respiratory failure with hypoxia: Secondary | ICD-10-CM | POA: Diagnosis not present

## 2014-07-21 DIAGNOSIS — K648 Other hemorrhoids: Secondary | ICD-10-CM | POA: Insufficient documentation

## 2014-07-21 LAB — GLUCOSE, CAPILLARY
GLUCOSE-CAPILLARY: 305 mg/dL — AB (ref 70–99)
Glucose-Capillary: 349 mg/dL — ABNORMAL HIGH (ref 70–99)
Glucose-Capillary: 291 mg/dL — ABNORMAL HIGH (ref 70–99)
Glucose-Capillary: 207 mg/dL — ABNORMAL HIGH (ref 70–99)
Glucose-Capillary: 116 mg/dL — ABNORMAL HIGH (ref 70–99)

## 2014-07-21 LAB — BASIC METABOLIC PANEL
ANION GAP: 11 (ref 5–15)
Anion gap: 23 — ABNORMAL HIGH (ref 5–15)
Anion gap: 19 — ABNORMAL HIGH (ref 5–15)
Anion gap: 14 (ref 5–15)
BUN: 20 mg/dL (ref 6–23)
BUN: 19 mg/dL (ref 6–23)
BUN: 22 mg/dL (ref 6–23)
BUN: 22 mg/dL (ref 6–23)
CO2: 17 mEq/L — ABNORMAL LOW (ref 19–32)
CO2: 19 meq/L (ref 19–32)
CO2: 22 mEq/L (ref 19–32)
CO2: 24 mEq/L (ref 19–32)
CREATININE: 0.79 mg/dL (ref 0.50–1.35)
Calcium: 9.7 mg/dL (ref 8.4–10.5)
Calcium: 9.1 mg/dL (ref 8.4–10.5)
Calcium: 8.5 mg/dL (ref 8.4–10.5)
Calcium: 8.6 mg/dL (ref 8.4–10.5)
Chloride: 92 mEq/L — ABNORMAL LOW (ref 96–112)
Chloride: 100 mEq/L (ref 96–112)
Chloride: 100 mEq/L (ref 96–112)
Chloride: 101 mEq/L (ref 96–112)
Creatinine, Ser: 0.7 mg/dL (ref 0.50–1.35)
Creatinine, Ser: 0.74 mg/dL (ref 0.50–1.35)
Creatinine, Ser: 0.76 mg/dL (ref 0.50–1.35)
GFR calc Af Amer: >90 mL/min (ref >90)
GFR calc Af Amer: >90 mL/min (ref >90)
GLUCOSE: 238 mg/dL — AB (ref 70–99)
GLUCOSE: 302 mg/dL — AB (ref 70–99)
Glucose, Bld: 393 mg/dL — ABNORMAL HIGH (ref 70–99)
Glucose, Bld: 95 mg/dL (ref 70–99)
POTASSIUM: 4.6 meq/L (ref 3.7–5.3)
POTASSIUM: 3.6 meq/L — AB (ref 3.7–5.3)
Potassium: 3.8 mEq/L (ref 3.7–5.3)
Potassium: 3.6 mEq/L — ABNORMAL LOW (ref 3.7–5.3)
SODIUM: 138 meq/L (ref 137–147)
SODIUM: 136 meq/L — AB (ref 137–147)
SODIUM: 136 meq/L — AB (ref 137–147)
Sodium: 132 mEq/L — ABNORMAL LOW (ref 137–147)

## 2014-07-21 LAB — CBC WITH DIFFERENTIAL/PLATELET
BASOS ABS: 0 10*3/uL (ref 0.0–0.1)
BASOS PCT: 0 % (ref 0–1)
EOS ABS: 0.1 10*3/uL (ref 0.0–0.7)
Eosinophils Relative: 0 % (ref 0–5)
HCT: 44.4 % (ref 39.0–52.0)
HEMOGLOBIN: 15.4 g/dL (ref 13.0–17.0)
Lymphocytes Relative: 10 % — ABNORMAL LOW (ref 12–46)
Lymphs Abs: 1.8 10*3/uL (ref 0.7–4.0)
MCH: 28.5 pg (ref 26.0–34.0)
MCHC: 34.7 g/dL (ref 30.0–36.0)
MCV: 82.1 fL (ref 78.0–100.0)
MONO ABS: 1.8 10*3/uL — AB (ref 0.1–1.0)
MONOS PCT: 10 % (ref 3–12)
Neutro Abs: 15.1 10*3/uL — ABNORMAL HIGH (ref 1.7–7.7)
Neutrophils Relative %: 80 % — ABNORMAL HIGH (ref 43–77)
Platelets: 299 10*3/uL (ref 150–400)
RBC: 5.41 MIL/uL (ref 4.22–5.81)
RDW: 12.9 % (ref 11.5–15.5)
WBC: 18.8 10*3/uL — ABNORMAL HIGH (ref 4.0–10.5)

## 2014-07-21 LAB — MAGNESIUM: MAGNESIUM: 2.1 mg/dL (ref 1.5–2.5)

## 2014-07-21 LAB — URINE MICROSCOPIC-ADD ON

## 2014-07-21 LAB — LACTIC ACID, PLASMA: Lactic Acid, Venous: 1.3 mmol/L (ref 0.5–2.2)

## 2014-07-21 LAB — URINALYSIS, ROUTINE W REFLEX MICROSCOPIC
BILIRUBIN URINE: NEGATIVE
Glucose, UA: >1000 mg/dL — AB
Hgb urine dipstick: NEGATIVE
Ketones, ur: 40 mg/dL — AB
Leukocytes, UA: NEGATIVE
NITRITE: NEGATIVE
Protein, ur: NEGATIVE mg/dL
SPECIFIC GRAVITY, URINE: 1.037 — AB (ref 1.005–1.030)
Urobilinogen, UA: 0.2 mg/dL (ref 0.0–1.0)
pH: 5 (ref 5.0–8.0)

## 2014-07-21 LAB — CBG MONITORING, ED
GLUCOSE-CAPILLARY: 293 mg/dL — AB (ref 70–99)
GLUCOSE-CAPILLARY: 310 mg/dL — AB (ref 70–99)
Glucose-Capillary: 246 mg/dL — ABNORMAL HIGH (ref 70–99)
Glucose-Capillary: 287 mg/dL — ABNORMAL HIGH (ref 70–99)
Glucose-Capillary: 319 mg/dL — ABNORMAL HIGH (ref 70–99)

## 2014-07-21 LAB — T4, FREE: FREE T4: 1.28 ng/dL (ref 0.80–1.80)

## 2014-07-21 LAB — TSH: TSH: 0.573 u[IU]/mL (ref 0.350–4.500)

## 2014-07-21 LAB — TROPONIN I
Troponin I: <0.3 ng/mL (ref <0.30)
Troponin I: <0.3 ng/mL (ref <0.30)

## 2014-07-21 MED ORDER — ASPIRIN EC 81 MG PO TBEC
81.0000 mg | DELAYED_RELEASE_TABLET | Freq: Every day | ORAL | Status: DC
  Administered 2014-07-22 – 2014-08-05 (×15): 81 mg via ORAL
  Filled 2014-07-21 (×16): qty 1

## 2014-07-21 MED ORDER — ACETAMINOPHEN 325 MG PO TABS
650.0000 mg | ORAL_TABLET | ORAL | Status: DC | PRN
  Administered 2014-07-29 – 2014-08-06 (×5): 650 mg via ORAL
  Filled 2014-07-21 (×5): qty 2

## 2014-07-21 MED ORDER — INFLUENZA VAC SPLIT QUAD 0.5 ML IM SUSY: 0.5000 mL | PREFILLED_SYRINGE | INTRAMUSCULAR | Status: DC | PRN

## 2014-07-21 MED ORDER — SODIUM CHLORIDE 0.9 % IV BOLUS (SEPSIS): 1000.0000 mL | Freq: Once | INTRAVENOUS | Status: DC

## 2014-07-21 MED ORDER — SODIUM CHLORIDE 0.9 % IV BOLUS (SEPSIS)
250.0000 mL | Freq: Once | INTRAVENOUS | Status: AC
  Administered 2014-07-21: 250 mL via INTRAVENOUS

## 2014-07-21 MED ORDER — INSULIN REGULAR BOLUS VIA INFUSION
0.0000 [IU] | Freq: Three times a day (TID) | INTRAVENOUS | Status: DC
  Filled 2014-07-21: qty 10

## 2014-07-21 MED ORDER — MORPHINE SULFATE 4 MG/ML IJ SOLN
4.0000 mg | Freq: Once | INTRAMUSCULAR | Status: DC
  Filled 2014-07-21: qty 1

## 2014-07-21 MED ORDER — SODIUM CHLORIDE 0.9 % IV BOLUS (SEPSIS)
1000.0000 mL | Freq: Once | INTRAVENOUS | Status: AC
  Administered 2014-07-21: 1000 mL via INTRAVENOUS

## 2014-07-21 MED ORDER — ASPIRIN 81 MG PO CHEW: 324.0000 mg | CHEWABLE_TABLET | ORAL | Status: AC

## 2014-07-21 MED ORDER — SODIUM CHLORIDE 0.9 % IV SOLN
INTRAVENOUS | Status: DC
  Administered 2014-07-21: 100 mL/h via INTRAVENOUS
  Administered 2014-07-22 (×2): via INTRAVENOUS

## 2014-07-21 MED ORDER — SODIUM CHLORIDE 0.9 % IV SOLN
Freq: Once | INTRAVENOUS | Status: AC
  Administered 2014-07-21: 13:00:00 via INTRAVENOUS

## 2014-07-21 MED ORDER — APIXABAN 5 MG PO TABS
5.0000 mg | ORAL_TABLET | Freq: Two times a day (BID) | ORAL | Status: DC
  Administered 2014-07-21 – 2014-07-27 (×14): 5 mg via ORAL
  Filled 2014-07-21 (×17): qty 1

## 2014-07-21 MED ORDER — SODIUM CHLORIDE 0.9 % IV SOLN
Freq: Once | INTRAVENOUS | Status: AC
  Administered 2014-07-21: 11:00:00 via INTRAVENOUS

## 2014-07-21 MED ORDER — ASPIRIN 81 MG PO CHEW
324.0000 mg | CHEWABLE_TABLET | Freq: Once | ORAL | Status: AC
  Administered 2014-07-21: 324 mg via ORAL
  Filled 2014-07-21: qty 4

## 2014-07-21 MED ORDER — ASPIRIN 81 MG PO TABS: 81.0000 mg | ORAL_TABLET | Freq: Every day | ORAL | Status: DC

## 2014-07-21 MED ORDER — ASPIRIN 300 MG RE SUPP
300.0000 mg | RECTAL | Status: AC
  Filled 2014-07-21: qty 1

## 2014-07-21 MED ORDER — ONDANSETRON HCL 4 MG/2ML IJ SOLN
4.0000 mg | Freq: Once | INTRAMUSCULAR | Status: AC
  Administered 2014-07-21: 4 mg via INTRAVENOUS
  Filled 2014-07-21: qty 2

## 2014-07-21 MED ORDER — ATORVASTATIN CALCIUM 10 MG PO TABS
10.0000 mg | ORAL_TABLET | Freq: Every morning | ORAL | Status: DC
  Administered 2014-07-22 – 2014-08-07 (×17): 10 mg via ORAL
  Filled 2014-07-21 (×17): qty 1

## 2014-07-21 MED ORDER — DEXTROSE 50 % IV SOLN: 25.0000 mL | INTRAVENOUS | Status: DC | PRN

## 2014-07-21 MED ORDER — DEXTROSE-NACL 5-0.45 % IV SOLN
INTRAVENOUS | Status: DC
  Administered 2014-07-21: 75 mL/h via INTRAVENOUS
  Administered 2014-07-21 – 2014-07-22 (×2): via INTRAVENOUS

## 2014-07-21 MED ORDER — IBUPROFEN 400 MG PO TABS
400.0000 mg | ORAL_TABLET | Freq: Once | ORAL | Status: AC
  Administered 2014-07-21: 400 mg via ORAL
  Filled 2014-07-21: qty 1

## 2014-07-21 MED ORDER — DILTIAZEM LOAD VIA INFUSION
10.0000 mg | Freq: Once | INTRAVENOUS | Status: AC
  Administered 2014-07-21: 10 mg via INTRAVENOUS
  Filled 2014-07-21: qty 10

## 2014-07-21 MED ORDER — PNEUMOCOCCAL VAC POLYVALENT 25 MCG/0.5ML IJ INJ
0.5000 mL | INJECTION | INTRAMUSCULAR | Status: AC
  Administered 2014-07-22: 0.5 mL via INTRAMUSCULAR
  Filled 2014-07-21: qty 0.5

## 2014-07-21 MED ORDER — ONDANSETRON HCL 4 MG/2ML IJ SOLN
4.0000 mg | Freq: Four times a day (QID) | INTRAMUSCULAR | Status: DC | PRN
  Administered 2014-07-27: 4 mg via INTRAVENOUS
  Filled 2014-07-21: qty 2

## 2014-07-21 MED ORDER — GLIPIZIDE 5 MG PO TABS
5.0000 mg | ORAL_TABLET | Freq: Every day | ORAL | Status: DC
  Filled 2014-07-21 (×2): qty 1

## 2014-07-21 MED ORDER — SODIUM CHLORIDE 0.9 % IV SOLN
INTRAVENOUS | Status: DC
  Administered 2014-07-21: 11.6 [IU]/h via INTRAVENOUS
  Administered 2014-07-21: 1.9 [IU]/h via INTRAVENOUS
  Administered 2014-07-21: 9.8 [IU]/h via INTRAVENOUS
  Administered 2014-07-21: 2.8 [IU]/h via INTRAVENOUS
  Administered 2014-07-21: 5.5 [IU]/h via INTRAVENOUS
  Filled 2014-07-21: qty 2.5

## 2014-07-21 MED ORDER — DEXTROSE 5 % IV SOLN: 5.0000 mg/h | INTRAVENOUS | Status: DC

## 2014-07-21 NOTE — ED Notes (Signed)
Pt SOB. Responding well to fluids. BP at 114/56

## 2014-07-21 NOTE — ED Notes (Signed)
Pt states he just had a sharp pain in left chest 7/10 for a few seconds and then it went away

## 2014-07-21 NOTE — ED Notes (Signed)
Pt reports having cardiac hx. Having mid chest pains x 2 days but now having nausea, sob and near syncope.

## 2014-07-21 NOTE — ED Notes (Signed)
Gave pt crackers, [place] sandwich, diet sprite

## 2014-07-21 NOTE — ED Notes (Signed)
Per PA, finish NS bolus and reassess pt at that time. Then we will decide to give morphine and diltiazem at that time.

## 2014-07-21 NOTE — Consult Note (Addendum)
[facility] Medical Consultation  Thomas Wright ZLD:[id number] DOB: 1945-11-07 DOA: 07/21/2014 PCP: [name], MD   Requesting physician: [name] Date of consultation: 11/4 Reason for consultation: multiple med problems  Impression/Recommendations Principal Problem:   Diabetic ketoacidosis - non compliant with Diabetic meds, need to r/o underlying infection too given leukocytosis - Start Insulin gtt, IVF and bolus now   Leukocytosis -check lactic acid stat -IVF bolus and maintenance -watch for signs of sepsis -monitor off Abx for now -check UA and blood Cx x2  Afib with RVR/chest pain -cycle cardiac enzymes  -echo, mgt per Cards  R finger h/o tenosynovitis -clinically doesn't appear infected at this time, followed by Dr.[name] -Supposed to have further surgery next week -wound care  Diarrhea -improved today -check Cdiff PCR  DM -uncontrolled, check hbaic -SSI   H/O aortic valve replacement with tissue graft  -Fu Blood Cx  DVT proph: apixaban  I will followup again tomorrow. Please contact me if I can be of assistance in the meanwhile. Thank you for this consultation.  Chief Complaint: " Don't feel good,  Chest pain"  HPI:  68/M with PMH of DM, HTN, Afib, h/o bioprosthetic valve replacement in 2008, h/o tenosynovitis and I&D in 8/15 presented to the ER with generalized malaise, chest pain, generalized pain for 4-5days,  found to have Afib with RVR and cards admitting him for this and [facility] consulted. He has not been taking his oral hypoglycemics for months now, his CBGs have been running in 300s-400s now for weeks. In addition he also felt poorly and had diarrhea yesterday and some chills over the weekend, no fevers. He also saw Dr.[name] to FU his R finger problem on Monday and is scheduled to have his finger nail removed next week and a skin graft placed.   Review of Systems:  12 system review negative except per HPI  Past Medical  History  Diagnosis Date  . Diabetes mellitus   . Hypertension   . Atrial fibrillation   . Hypertriglyceridemia   . Afib     Stopped amiodarone 2013-no further episodes of atrial fibrillation. Postoperative 2008  . H/O aortic valve replacement with tissue graft     2008.  . Back pain   . Cardiomyopathy     EF originally 20%-now 45%, no CAD on cath   Past Surgical History  Procedure Laterality Date  . Cardiac surgery  05/2007    Aortic Valve Replacement  . I&d extremity Right 05/05/2014    Procedure: IRRIGATION AND DEBRIDEMENT EXTREMITY;  Surgeon: [name], MD;  Location: [place];  Service: Orthopedics;  Laterality: Right;   Social History:  reports that he has never smoked. He has never used smokeless tobacco. He reports that he does not drink alcohol or use illicit drugs.  No Known Allergies Family History  Problem Relation Age of Onset  . Colon cancer Father   . Liver disease Brother   . Heart murmur Child   . Congenital heart disease Other     Prior to Admission medications   Medication Sig Start Date End Date Taking? Authorizing Provider  aspirin 81 MG tablet Take 81 mg by mouth daily.   Yes Historical Provider, MD  atorvastatin (LIPITOR) 10 MG tablet Take 10 mg by mouth every morning. 06/07/14  Yes Historical Provider, MD  carvedilol (COREG) 25 MG tablet Take 1 tablet (25 mg total) by mouth 2 (two) times daily with a meal. 02/04/14  Yes [name], MD  doxycycline (VIBRAMYCIN) 100 MG capsule  Take 100 mg by mouth once. 06/14/14  Yes Historical Provider, MD  furosemide (LASIX) 20 MG tablet Take 1 tablet (20 mg total) by mouth daily. 12/15/13  Yes [name], MD  glipiZIDE (GLUCOTROL) 5 MG tablet Take 1 tablet (5 mg total) by mouth daily before breakfast. 12/18/13  Yes [name], MD  HYDROcodone-acetaminophen (NORCO/VICODIN) 5-325 MG per tablet Take 1-2 tablets by mouth every 4 (four) hours as needed for moderate pain. 05/06/14  Yes Reyne Dumas, MD  losartan (COZAAR) 50 MG  tablet Take 1 tablet (50 mg total) by mouth daily. 12/15/13  Yes [name], MD  metFORMIN (GLUCOPHAGE) 1000 MG tablet Take 1,000 mg by mouth 2 (two) times daily with a meal.   Yes Historical Provider, MD   Physical Exam: Blood pressure 128/77, pulse 127, temperature 98.6 F (37 C), temperature source Oral, resp. rate 29, height 6' (1.829 m), weight 97.523 kg (215 lb), SpO2 99 %. Filed Vitals:   07/21/14 1445  BP: 128/77  Pulse: 127  Temp:   Resp: 29     General:  AAOx3, no distress  Eyes: PERRLA  ENT: oral mucosa moist and pink  Neck: no lymphadenopathy and thyromegaly  Cardiovascular: S1S2/RRR  Respiratory: CTAB  Abdomen: soft, NT, ND, BS present  Skin: no rashes, R hand middle finger with open wound with red granulation tissue and areas with gauze embedded in tissue  Musculoskeletal: no edema c/c  Psychiatric: appropriate mood and affect  Neurologic: non focal  Labs on Admission:  Basic Metabolic Panel:  Recent Labs Lab 07/21/14 0959  NA 132*  K 4.6  CL 92*  CO2 17*  GLUCOSE 393*  BUN 20  CREATININE 0.79  CALCIUM 9.7   Liver Function Tests: No results for input(s): AST, ALT, ALKPHOS, BILITOT, PROT, ALBUMIN in the last 168 hours. No results for input(s): LIPASE, AMYLASE in the last 168 hours. No results for input(s): AMMONIA in the last 168 hours. CBC:  Recent Labs Lab 07/21/14 0959  WBC 18.8*  NEUTROABS 15.1*  HGB 15.4  HCT 44.4  MCV 82.1  PLT 299   Cardiac Enzymes:  Recent Labs Lab 07/21/14 1012  TROPONINI <0.30   BNP: Invalid input(s): POCBNP CBG:  Recent Labs Lab 07/21/14 1415  GLUCAP 293*    Radiological Exams on Admission: Dg Chest Port 1 View  07/21/2014   CLINICAL DATA:  68 year old male presenting with acute onset of chest pain.  EXAM: PORTABLE CHEST - 1 VIEW  COMPARISON:  Chest x-ray 10/03/2010.  FINDINGS: Lung volumes are low. Chronic elevation of the right hemidiaphragm is unchanged. No consolidative airspace  disease. No pleural effusions. No pneumothorax. No pulmonary nodule or mass noted. Pulmonary vasculature and the cardiomediastinal silhouette are within normal limits. Status post median sternotomy.  IMPRESSION: 1. Low lung volumes without radiographic evidence of acute cardiopulmonary disease.   Electronically Signed   By: Vinnie Langton M.D.   On: 07/21/2014 10:23    EKG: Independently reviewed. NSR, LVH with repolarization abnormality  Time spent: 41min  Pascual Mantel [facility] Pager [phone]  If 7PM-7AM, please contact night-coverage [email or web address] Password [email or web address] 07/21/2014, 3:18 PM

## 2014-07-21 NOTE — ED Notes (Addendum)
Pt presents to ED with complaint of new onset chest pain x 2 days. Pt describing pain as sharp, stabbing in left chest. Pt pale, hands cool to touch, pt complaining of nausea at this time. Pt complaining of being light headed and states he feels like he is going to pass out.

## 2014-07-21 NOTE — ED Notes (Signed)
Attempted to call report. 3E RN unsure pt appropriate for 3E. Spoke with PA, will notify Dr. Marlou Porch

## 2014-07-21 NOTE — H&P (Addendum)
Admit date: 07/21/2014 Primary Physician  [name], MD Primary Cardiologist  Skains   CC: atrial fibrillation, chest pain, general malaise  HPI: 68 year old male with paroxysmal atrial fibrillation, stopped amiodarone in 2013 after no further episodes, with prior episode of postoperative atrial fibrillation following bioprosthetic aortic valve replacement in 2008 here with atypical chest discomfort and atrial fibrillation with rapid ventricular response, general malaise.  His chest pain started approximately 2 days ago and has been intermittent, sharp, left-sided stabbing-like, fleeting. He reports some mild shortness of breath. Mostly he has been feeling weak, first hemorrhoid and 30 years he states. Took Epsom salts yesterday evening, had profuse diarrhea.  CHADS-VASc score is at least 3 ( age, diabetes, hypertension), could also include congestive heart failure however his ejection fraction most recently was 45-50%.  My last office visit with him on 12/15/13 noted that he was having severe stress at home. He was trying to get back to work, driving a school bus.  He was recently discharged from the hospital on 05/06/14 secondary to right long finger extensor sheath infection status post incision and drainage.on Monday he saw his hand surgeon and felt poor during that office visit, head down on table, felt weak, nauseous. His hand has improved with decreased erythema.next Wednesday he was supposed to go to the operating room to remove his nail.  Aortic root is also moderately dilatedprior echocardiogram demonstrated an 47 mm however CT scan of aorta on 12/14/13 demonstrated a maximum diameter of 4.2 cm. This was at the sinuses of Valsalva.    PMH:   Past Medical History  Diagnosis Date  . Diabetes mellitus   . Hypertension   . Atrial fibrillation   . Hypertriglyceridemia   . Afib     Stopped amiodarone 2013-no further episodes of atrial fibrillation. Postoperative 2008    . H/O aortic valve replacement with tissue graft     2008.  . Back pain   . Cardiomyopathy     EF originally 20%-now 45%, no CAD on cath    PSH:   Past Surgical History  Procedure Laterality Date  . Cardiac surgery  05/2007    Aortic Valve Replacement  . I&d extremity Right 05/05/2014    Procedure: IRRIGATION AND DEBRIDEMENT EXTREMITY;  Surgeon: [name], MD;  Location: [place];  Service: Orthopedics;  Laterality: Right;   Allergies:  Review of patient's allergies indicates no known allergies. Prior to Admit Meds:   Prior to Admission medications   Medication Sig Start Date End Date Taking? Authorizing Provider  aspirin 81 MG tablet Take 81 mg by mouth daily.   Yes Historical Provider, MD  atorvastatin (LIPITOR) 10 MG tablet Take 10 mg by mouth every morning. 06/07/14  Yes Historical Provider, MD  carvedilol (COREG) 25 MG tablet Take 1 tablet (25 mg total) by mouth 2 (two) times daily with a meal. 02/04/14  Yes [name], MD  doxycycline (VIBRAMYCIN) 100 MG capsule Take 100 mg by mouth once. 06/14/14  Yes Historical Provider, MD  furosemide (LASIX) 20 MG tablet Take 1 tablet (20 mg total) by mouth daily. 12/15/13  Yes [name], MD  glipiZIDE (GLUCOTROL) 5 MG tablet Take 1 tablet (5 mg total) by mouth daily before breakfast. 12/18/13  Yes [name], MD  HYDROcodone-acetaminophen (NORCO/VICODIN) 5-325 MG per tablet Take 1-2 tablets by mouth every 4 (four) hours as needed for moderate pain. 05/06/14  Yes Reyne Dumas, MD  losartan (COZAAR) 50 MG tablet Take 1 tablet (50 mg total) by  mouth daily. 12/15/13  Yes [name], MD  metFORMIN (GLUCOPHAGE) 1000 MG tablet Take 1,000 mg by mouth 2 (two) times daily with a meal.   Yes Historical Provider, MD   Fam HX:    Family History  Problem Relation Age of Onset  . Colon cancer Father   . Liver disease Brother   . Heart murmur Child   . Congenital heart disease Other    Social HX:    History   Social History  . Marital Status:  Married    Spouse Name: N/A    Number of Children: N/A  . Years of Education: N/A   Occupational History  . Not on file.   Social History Main Topics  . Smoking status: Never Smoker   . Smokeless tobacco: Never Used  . Alcohol Use: No  . Drug Use: No  . Sexual Activity: Not on file   Other Topics Concern  . Not on file   Social History Narrative     ROS:  Positive for generalized malaise, nausea, atypical sharp chest pain, increased shortness of breath. All 11 ROS were addressed and are negative except what is stated in the HPI   Physical Exam: Blood pressure 103/56, pulse 56, temperature 98.6 F (37 C), temperature source Oral, resp. rate 19, height 6' (1.829 m), weight 215 lb (97.523 kg), SpO2 99 %.   General: appears mildly weakened,Well developed, well nourished, in no acute distress Head: Eyes PERRLA, No xanthomas.   Normal cephalic and atramatic  Lungs:  Clear bilaterally to auscultation and percussion. Normal respiratory effort. No wheezes, no rales. Heart:  Irregularly irregularS1 S2 Pulses are 2+ & equal. No murmurs, rubs or gallops.             No carotid bruit. No JVD.  No abdominal bruits. Abdomen: Bowel sounds are positive, abdomen soft and non-tender without masses or                 Hernia's noted. No hepatosplenomegaly. Msk:  Back normal, normal gait. Normal strength and tone for age. Extremities:  No clubbing, cyanosis or edema.  DP +1, right middle finger is dressed, no palmar erythema Neuro: Alert and oriented X 3, non-focal, MAE x 4 GU: Deferred Rectal: Deferred Psych:  Good affect, responds appropriately         Labs:   Lab Results  Component Value Date   WBC 18.8* 07/21/2014   HGB 15.4 07/21/2014   HCT 44.4 07/21/2014   MCV 82.1 07/21/2014   PLT 299 07/21/2014    Recent Labs Lab 07/21/14 0959  NA 132*  K 4.6  CL 92*  CO2 17*  BUN 20  CREATININE 0.79  CALCIUM 9.7  GLUCOSE 393*    Recent Labs  07/21/14 1012  TROPONINI <0.30         Radiology:  Dg Chest Port 1 View  07/21/2014   CLINICAL DATA:  68 year old male presenting with acute onset of chest pain.  EXAM: PORTABLE CHEST - 1 VIEW  COMPARISON:  Chest x-ray 10/03/2010.  FINDINGS: Lung volumes are low. Chronic elevation of the right hemidiaphragm is unchanged. No consolidative airspace disease. No pleural effusions. No pneumothorax. No pulmonary nodule or mass noted. Pulmonary vasculature and the cardiomediastinal silhouette are within normal limits. Status post median sternotomy.  IMPRESSION: 1. Low lung volumes without radiographic evidence of acute cardiopulmonary disease.   Electronically Signed   By: Vinnie Langton M.D.   On: 07/21/2014 10:23   Personally viewed.  EKG:  07/21/14-atrial fibrillation, 136 bpm Personally viewed.  ASSESSMENT/PLAN:   68 year old male with return of atrial fibrillation with last occurrence in 2008 postoperatively in the setting of aortic valve replacement, bioprosthetic. Underlying trigger for atrial fibrillation is likely diabetic ketoacidosis, question possible systemic infection.  1. Atrial fibrillation with rapid ventricular response-currently on diltiazem, controlling rate. In 2013, his amiodarone was discontinued and he was not on anticoagulation. Given his current CHADS-VASc score of at least 3, I will start Eliquis 5 mg twice a day. His bioprosthetic aortic valve is not the reason for his atrial fibrillation. Hence, he does not have valvular atrial fibrillation. We will continue with rate control as long as he is able to tolerate. If rate control becomes an issue, we will consider TEE cardioversion.   2. Cardiomyopathy-ejection fraction has markedly improved since aortic valve replacement, from 25 up to 50. We will continue with carvedilol 25 mg twice a day.continue with losartan when able to provide from a blood pressure perspective. Currently these medications are on hold. My first addition will be carvedilol to help with rate  control.  3. Bioprosthetic aortic valve replacement-stable. We will check echocardiogram. With his ongoing finger infection, current elevated white blood cell count,generalized malaise, possibility of vegetation/endocarditis is present. See below.  4. 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 Given his current chest pain, fairly atypical, sharp, stabbing clinically does not resemble aortic dissection. Will cycle troponin.   5. Diabetes-glucose was 390 in emergency room.hold metformin given possibility of CT. Insulin sliding scale. Has not been taking meds at home as often as he should. Check A1c. With his lab figures, bicarbonate of 17, glucose of 400, white count of 18.8, AG 23-- diabetic ketoacidosis. See below.  6. General malaise/ diabetic ketoacidosis-over the past 2 days has felt poor. On Monday he had his finger debrided at orthopedist. Next Wednesday he was supposed to have another orthopedic procedure. His hand currently is improved, he states that previously there was erythema throughout, currently there does not appear to be any active erythema. Finger is currently dressed. He is afebrile. His white blood cell count however is 18.8, CO2 is 17. Anion Gap is 23. Glucose was 393. This seems to resemble diabetic ketoacidosis. I'm going to give him another liter of fluid. I will consult [facility] for further evaluation and management. I will defer to them need for possible blood cultures or systemic antibiotics. Appreciate their input.    [name], MD  07/21/2014  12:11 PM

## 2014-07-21 NOTE — ED Notes (Signed)
SOB has resolved with 2 L O2. Pt states he feels a little better with fluids running.

## 2014-07-21 NOTE — ED Provider Notes (Signed)
CSN: [id number]     Arrival date & time 07/21/14  9983 History   First MD Initiated Contact with Patient 07/21/14 [phone]     Chief Complaint  Patient presents with  . Chest Pain  . Weakness     (Consider location/radiation/quality/duration/timing/severity/associated sxs/prior Treatment) HPI Comments: Patient with DM, HTN, HL, a-fib, and aortic valve replacement, presents to the ED with a chief complaint of chest pain. Patient states the pain started 2 days ago. States pain has been constant. He reports some associated shortness of breath and diaphoresis. He denies any radiating symptoms. He has tried taking Aleve with no relief. He states his pain is moderate to severe. He states that today he began to feel weak. He does not take anything for his A. Fib. He is followed by Dr. Marlou Porch cardiology in [place].  Additionally, he complains of having hemorrhoids.  The history is provided by the patient. No language interpreter was used.    Past Medical History  Diagnosis Date  . Diabetes mellitus   . Hypertension   . Atrial fibrillation   . Hypertriglyceridemia   . Afib     Stopped amiodarone 2013-no further episodes of atrial fibrillation. Postoperative 2008  . H/O aortic valve replacement with tissue graft     2008.  . Back pain   . Cardiomyopathy     EF originally 20%-now 45%, no CAD on cath   Past Surgical History  Procedure Laterality Date  . Cardiac surgery  05/2007    Aortic Valve Replacement  . I&d extremity Right 05/05/2014    Procedure: IRRIGATION AND DEBRIDEMENT EXTREMITY;  Surgeon: [name], MD;  Location: [facility];  Service: Orthopedics;  Laterality: Right;   Family History  Problem Relation Age of Onset  . Colon cancer Father   . Liver disease Brother   . Heart murmur Child   . Congenital heart disease Other    History  Substance Use Topics  . Smoking status: Never Smoker   . Smokeless tobacco: Never Used  . Alcohol Use: No    Review of Systems   Constitutional: Negative for fever and chills.  Respiratory: Positive for shortness of breath.   Cardiovascular: Positive for chest pain.  Gastrointestinal: Negative for nausea, vomiting, diarrhea and constipation.  Genitourinary: Negative for dysuria.  All other systems reviewed and are negative.     Allergies  Review of patient's allergies indicates no known allergies.  Home Medications   Prior to Admission medications   Medication Sig Start Date End Date Taking? Authorizing Provider  aspirin 81 MG tablet Take 81 mg by mouth daily.    Historical Provider, MD  carvedilol (COREG) 25 MG tablet Take 1 tablet (25 mg total) by mouth 2 (two) times daily with a meal. 02/04/14   [name], MD  furosemide (LASIX) 20 MG tablet Take 1 tablet (20 mg total) by mouth daily. 12/15/13   [name], MD  glipiZIDE (GLUCOTROL) 5 MG tablet Take 1 tablet (5 mg total) by mouth daily before breakfast. 12/18/13   [name], MD  HYDROcodone-acetaminophen (NORCO/VICODIN) 5-325 MG per tablet Take 1-2 tablets by mouth every 4 (four) hours as needed for moderate pain. 05/06/14   Reyne Dumas, MD  losartan (COZAAR) 50 MG tablet Take 1 tablet (50 mg total) by mouth daily. 12/15/13   [name], MD  metFORMIN (GLUCOPHAGE) 1000 MG tablet Take 1,000 mg by mouth 2 (two) times daily with a meal.    Historical Provider, MD   There were no vitals  taken for this visit. Physical Exam  Constitutional: He is oriented to person, place, and time. He appears well-developed and well-nourished.  HENT:  Head: Normocephalic and atraumatic.  Eyes: Conjunctivae and EOM are normal. Pupils are equal, round, and reactive to light. Right eye exhibits no discharge. Left eye exhibits no discharge. No scleral icterus.  Neck: Normal range of motion. Neck supple. No JVD present.  Cardiovascular: Normal heart sounds.  Exam reveals no gallop and no friction rub.   No murmur heard. Tachycardic, irregularly irregular  Pulmonary/Chest: Effort  normal and breath sounds normal. No respiratory distress. He has no wheezes. He has no rales. He exhibits no tenderness.  Abdominal: Soft. He exhibits no distension and no mass. There is no tenderness. There is no rebound and no guarding.  Musculoskeletal: Normal range of motion. He exhibits no edema or tenderness.  Neurological: He is alert and oriented to person, place, and time.  Skin: Skin is warm and dry.  Psychiatric: He has a normal mood and affect. His behavior is normal. Judgment and thought content normal.  Nursing note and vitals reviewed.   ED Course  Procedures (including critical care time) Results for orders placed or performed during the hospital encounter of 07/21/14  CBC with Differential  Result Value Ref Range   WBC 18.8 (H) 4.0 - 10.5 K/uL   RBC 5.41 4.22 - 5.81 MIL/uL   Hemoglobin 15.4 13.0 - 17.0 g/dL   HCT 44.4 39.0 - 52.0 %   MCV 82.1 78.0 - 100.0 fL   MCH 28.5 26.0 - 34.0 pg   MCHC 34.7 30.0 - 36.0 g/dL   RDW 12.9 11.5 - 15.5 %   Platelets 299 150 - 400 K/uL   Neutrophils Relative % 80 (H) 43 - 77 %   Neutro Abs 15.1 (H) 1.7 - 7.7 K/uL   Lymphocytes Relative 10 (L) 12 - 46 %   Lymphs Abs 1.8 0.7 - 4.0 K/uL   Monocytes Relative 10 3 - 12 %   Monocytes Absolute 1.8 (H) 0.1 - 1.0 K/uL   Eosinophils Relative 0 0 - 5 %   Eosinophils Absolute 0.1 0.0 - 0.7 K/uL   Basophils Relative 0 0 - 1 %   Basophils Absolute 0.0 0.0 - 0.1 K/uL  Basic metabolic panel  Result Value Ref Range   Sodium 132 (L) 137 - 147 mEq/L   Potassium 4.6 3.7 - 5.3 mEq/L   Chloride 92 (L) 96 - 112 mEq/L   CO2 17 (L) 19 - 32 mEq/L   Glucose, Bld 393 (H) 70 - 99 mg/dL   BUN 20 6 - 23 mg/dL   Creatinine, Ser 0.79 0.50 - 1.35 mg/dL   Calcium 9.7 8.4 - 10.5 mg/dL   GFR calc non Af Amer >90 >90 mL/min   GFR calc Af Amer >90 >90 mL/min   Anion gap 23 (H) 5 - 15  Troponin I  Result Value Ref Range   Troponin I <0.30 <0.30 ng/mL   Dg Chest Port 1 View  07/21/2014   CLINICAL DATA:   68 year old male presenting with acute onset of chest pain.  EXAM: PORTABLE CHEST - 1 VIEW  COMPARISON:  Chest x-ray 10/03/2010.  FINDINGS: Lung volumes are low. Chronic elevation of the right hemidiaphragm is unchanged. No consolidative airspace disease. No pleural effusions. No pneumothorax. No pulmonary nodule or mass noted. Pulmonary vasculature and the cardiomediastinal silhouette are within normal limits. Status post median sternotomy.  IMPRESSION: 1. Low lung volumes without radiographic evidence of  acute cardiopulmonary disease.   Electronically Signed   By: Vinnie Langton M.D.   On: 07/21/2014 10:23      EKG Interpretation   Date/Time:  Wednesday July 21 2014 09:46:56 EST Ventricular Rate:  125 PR Interval:    QRS Duration: 112 QT Interval:  352 QTC Calculation: 508 R Axis:   -30 Text Interpretation:  Atrial fibrillation with rapid ventricular response  Left axis deviation Incomplete left bundle branch block Moderate voltage  criteria for LVH, may be normal variant ST \\T \ T wave abnormality,  consider lateral ischemia changes noted Confirmed by HARRISON  MD, FORREST  ([id number]) on 07/21/2014 9:56:36 AM      MDM   Final diagnoses:  Chest pain    Patient with A. Fib, and chest pain. Symptoms started 2 days ago. Will check labs, EKG, chest x-ray. Appears to be A. Fib on the monitor. Will review EKG.  Medications  sodium chloride 0.9 % bolus 1,000 mL (not administered)  0.9 %  sodium chloride infusion (not administered)  apixaban (ELIQUIS) tablet 5 mg (not administered)  aspirin chewable tablet 324 mg (324 mg Oral Given 07/21/14 1033)  diltiazem (CARDIZEM) 1 mg/mL load via infusion 10 mg (0 mg Intravenous Stopped 07/21/14 1046)  ondansetron (ZOFRAN) injection 4 mg (4 mg Intravenous Given 07/21/14 1042)  0.9 %  sodium chloride infusion ( Intravenous Stopped 07/21/14 1156)    Patient responding well to initial fluid bolus. We'll continue with fluids, and reassess blood  pressure and heart rate. Hold Cardizem for the time being.  Patient seen by and discussed with Dr. Aline Brochure, recommends cardiology consult.  Cardiology to admit the patient.    Montine Circle, PA-C 07/21/14 Maynard, MD 07/22/14 [phone]

## 2014-07-21 NOTE — ED Notes (Signed)
Spoke with PA about pt bp and stability at this time. PA gave instructions to hold morphine and diltiazem until Dr. Aline Brochure could take a look at pt. 2 L O2 placed on pt for SOB.

## 2014-07-21 NOTE — ED Notes (Signed)
Pt very weak at this time. Pulses weak. Pt alert oriented x 4.

## 2014-07-21 NOTE — ED Notes (Signed)
Portable x-ray at bedside at this time.

## 2014-07-21 NOTE — ED Notes (Signed)
Ordered carb mod meal tray for pt

## 2014-07-21 NOTE — ED Notes (Signed)
Cardiology at bedside.

## 2014-07-21 NOTE — ED Notes (Signed)
Pt complaining of hemorrhoids. Pt states hemorrhoid was recognized this past Sunday. Pt states it stopped him from having bowel movements and then it "blew out" yesterday. Pt states now he can't cough, sneeze, laugh, etc. Without using the bathroom on hisself. Pt sates he took an epson salt bath yesterday and also drink some epson salt.

## 2014-07-22 DIAGNOSIS — E1310 Other specified diabetes mellitus with ketoacidosis without coma: Secondary | ICD-10-CM | POA: Diagnosis not present

## 2014-07-22 DIAGNOSIS — I429 Cardiomyopathy, unspecified: Secondary | ICD-10-CM

## 2014-07-22 DIAGNOSIS — M6588 Other synovitis and tenosynovitis, other site: Secondary | ICD-10-CM

## 2014-07-22 DIAGNOSIS — D72829 Elevated white blood cell count, unspecified: Secondary | ICD-10-CM | POA: Diagnosis present

## 2014-07-22 DIAGNOSIS — Z9119 Patient's noncompliance with other medical treatment and regimen: Secondary | ICD-10-CM

## 2014-07-22 DIAGNOSIS — I059 Rheumatic mitral valve disease, unspecified: Secondary | ICD-10-CM

## 2014-07-22 DIAGNOSIS — I10 Essential (primary) hypertension: Secondary | ICD-10-CM

## 2014-07-22 DIAGNOSIS — I4891 Unspecified atrial fibrillation: Secondary | ICD-10-CM

## 2014-07-22 DIAGNOSIS — Z91199 Patient's noncompliance with other medical treatment and regimen due to unspecified reason: Secondary | ICD-10-CM

## 2014-07-22 DIAGNOSIS — E860 Dehydration: Secondary | ICD-10-CM | POA: Diagnosis present

## 2014-07-22 LAB — GLUCOSE, CAPILLARY
GLUCOSE-CAPILLARY: 111 mg/dL — AB (ref 70–99)
GLUCOSE-CAPILLARY: 239 mg/dL — AB (ref 70–99)
GLUCOSE-CAPILLARY: 338 mg/dL — AB (ref 70–99)
Glucose-Capillary: 109 mg/dL — ABNORMAL HIGH (ref 70–99)
Glucose-Capillary: 134 mg/dL — ABNORMAL HIGH (ref 70–99)
Glucose-Capillary: 140 mg/dL — ABNORMAL HIGH (ref 70–99)
Glucose-Capillary: 230 mg/dL — ABNORMAL HIGH (ref 70–99)
Glucose-Capillary: 266 mg/dL — ABNORMAL HIGH (ref 70–99)

## 2014-07-22 LAB — CBC
HEMATOCRIT: 39 % (ref 39.0–52.0)
HEMOGLOBIN: 13.3 g/dL (ref 13.0–17.0)
MCH: 28.2 pg (ref 26.0–34.0)
MCHC: 34.1 g/dL (ref 30.0–36.0)
MCV: 82.8 fL (ref 78.0–100.0)
Platelets: 301 10*3/uL (ref 150–400)
RBC: 4.71 MIL/uL (ref 4.22–5.81)
RDW: 12.9 % (ref 11.5–15.5)
WBC: 20.4 10*3/uL — ABNORMAL HIGH (ref 4.0–10.5)

## 2014-07-22 LAB — BASIC METABOLIC PANEL
Anion gap: 14 (ref 5–15)
Anion gap: 15 (ref 5–15)
BUN: 20 mg/dL (ref 6–23)
BUN: 19 mg/dL (ref 6–23)
CALCIUM: 8.5 mg/dL (ref 8.4–10.5)
CO2: 19 meq/L (ref 19–32)
CO2: 18 mEq/L — ABNORMAL LOW (ref 19–32)
CREATININE: 0.62 mg/dL (ref 0.50–1.35)
Calcium: 8.4 mg/dL (ref 8.4–10.5)
Chloride: 102 mEq/L (ref 96–112)
Chloride: 103 mEq/L (ref 96–112)
Creatinine, Ser: 0.56 mg/dL (ref 0.50–1.35)
GFR calc Af Amer: >90 mL/min (ref >90)
GFR calc Af Amer: >90 mL/min (ref >90)
GFR calc non Af Amer: >90 mL/min (ref >90)
GFR calc non Af Amer: >90 mL/min (ref >90)
Glucose, Bld: 139 mg/dL — ABNORMAL HIGH (ref 70–99)
Glucose, Bld: 196 mg/dL — ABNORMAL HIGH (ref 70–99)
Potassium: 3.8 mEq/L (ref 3.7–5.3)
Potassium: 3.7 mEq/L (ref 3.7–5.3)
Sodium: 135 mEq/L — ABNORMAL LOW (ref 137–147)
Sodium: 136 mEq/L — ABNORMAL LOW (ref 137–147)

## 2014-07-22 LAB — MRSA PCR SCREENING: MRSA by PCR: NEGATIVE

## 2014-07-22 LAB — TROPONIN I: Troponin I: <0.3 ng/mL (ref <0.30)

## 2014-07-22 LAB — HEMOGLOBIN A1C
HEMOGLOBIN A1C: 11.8 % — AB (ref <5.7)
MEAN PLASMA GLUCOSE: 292 mg/dL — AB (ref <117)

## 2014-07-22 MED ORDER — AMIODARONE LOAD VIA INFUSION
150.0000 mg | Freq: Once | INTRAVENOUS | Status: AC
  Administered 2014-07-22: 150 mg via INTRAVENOUS
  Filled 2014-07-22 (×2): qty 83.34

## 2014-07-22 MED ORDER — GLIPIZIDE 5 MG PO TABS
5.0000 mg | ORAL_TABLET | Freq: Every day | ORAL | Status: DC
  Administered 2014-07-24 – 2014-07-25 (×2): 5 mg via ORAL
  Filled 2014-07-22 (×5): qty 1

## 2014-07-22 MED ORDER — INSULIN ASPART 100 UNIT/ML ~~LOC~~ SOLN
0.0000 [IU] | SUBCUTANEOUS | Status: DC
  Administered 2014-07-22: 5 [IU] via SUBCUTANEOUS

## 2014-07-22 MED ORDER — INSULIN ASPART 100 UNIT/ML ~~LOC~~ SOLN
0.0000 [IU] | Freq: Every day | SUBCUTANEOUS | Status: DC
  Administered 2014-07-22: 2 [IU] via SUBCUTANEOUS
  Administered 2014-07-23: 5 [IU] via SUBCUTANEOUS
  Administered 2014-07-25: 2 [IU] via SUBCUTANEOUS
  Administered 2014-07-25: 3 [IU] via SUBCUTANEOUS
  Administered 2014-07-26: 4 [IU] via SUBCUTANEOUS

## 2014-07-22 MED ORDER — LIVING WELL WITH DIABETES BOOK
Freq: Once | Status: DC
  Filled 2014-07-22 (×4): qty 1

## 2014-07-22 MED ORDER — AMIODARONE LOAD VIA INFUSION
150.0000 mg | Freq: Once | INTRAVENOUS | Status: AC
  Administered 2014-07-22: 150 mg via INTRAVENOUS
  Filled 2014-07-22: qty 83.34

## 2014-07-22 MED ORDER — AMIODARONE HCL IN DEXTROSE 360-4.14 MG/200ML-% IV SOLN
30.0000 mg/h | INTRAVENOUS | Status: DC
  Administered 2014-07-22 – 2014-07-24 (×5): 30 mg/h via INTRAVENOUS
  Filled 2014-07-22 (×12): qty 200

## 2014-07-22 MED ORDER — INSULIN ASPART 100 UNIT/ML ~~LOC~~ SOLN
0.0000 [IU] | Freq: Three times a day (TID) | SUBCUTANEOUS | Status: DC
  Administered 2014-07-22: 5 [IU] via SUBCUTANEOUS
  Administered 2014-07-22: 7 [IU] via SUBCUTANEOUS
  Administered 2014-07-23 (×2): 3 [IU] via SUBCUTANEOUS
  Administered 2014-07-24: 5 [IU] via SUBCUTANEOUS
  Administered 2014-07-24 (×2): 3 [IU] via SUBCUTANEOUS
  Administered 2014-07-25 (×2): 5 [IU] via SUBCUTANEOUS
  Administered 2014-07-25: 2 [IU] via SUBCUTANEOUS
  Administered 2014-07-26: 5 [IU] via SUBCUTANEOUS
  Administered 2014-07-26: 2 [IU] via SUBCUTANEOUS
  Administered 2014-07-26: 5 [IU] via SUBCUTANEOUS
  Administered 2014-07-27: 1 [IU] via SUBCUTANEOUS
  Administered 2014-07-27: 7 [IU] via SUBCUTANEOUS

## 2014-07-22 MED ORDER — SODIUM CHLORIDE 0.9 % IV SOLN
INTRAVENOUS | Status: DC
  Administered 2014-07-23: 500 mL via INTRAVENOUS

## 2014-07-22 MED ORDER — POLYETHYLENE GLYCOL 3350 17 G PO PACK
17.0000 g | PACK | Freq: Every day | ORAL | Status: DC
  Administered 2014-07-22 – 2014-07-28 (×6): 17 g via ORAL
  Filled 2014-07-22 (×17): qty 1

## 2014-07-22 MED ORDER — METOPROLOL TARTRATE 1 MG/ML IV SOLN
2.5000 mg | Freq: Once | INTRAVENOUS | Status: AC
Start: 2014-07-22 — End: 2014-07-22
  Administered 2014-07-22: 2.5 mg via INTRAVENOUS

## 2014-07-22 MED ORDER — AMIODARONE HCL IN DEXTROSE 360-4.14 MG/200ML-% IV SOLN
60.0000 mg/h | INTRAVENOUS | Status: AC
  Administered 2014-07-22: 60 mg/h via INTRAVENOUS
  Filled 2014-07-22: qty 200

## 2014-07-22 MED ORDER — INSULIN GLARGINE 100 UNIT/ML ~~LOC~~ SOLN
20.0000 [IU] | Freq: Every day | SUBCUTANEOUS | Status: DC
  Administered 2014-07-22 – 2014-07-25 (×5): 20 [IU] via SUBCUTANEOUS
  Filled 2014-07-22 (×7): qty 0.2

## 2014-07-22 MED ORDER — METOPROLOL TARTRATE 1 MG/ML IV SOLN
2.5000 mg | Freq: Once | INTRAVENOUS | Status: AC
  Administered 2014-07-22: 2.5 mg via INTRAVENOUS
  Filled 2014-07-22: qty 5

## 2014-07-22 MED ORDER — PERFLUTREN LIPID MICROSPHERE
1.0000 mL | INTRAVENOUS | Status: AC | PRN
  Administered 2014-07-22: 2 mL via INTRAVENOUS
  Filled 2014-07-22: qty 10

## 2014-07-22 NOTE — Progress Notes (Signed)
[facility] - Stepdown / ICU Consultant Note  Thomas Wright PJK:[id number] DOB: 08/10/1946 DOA: 07/21/2014 PCP: [name], MD   Brief narrative: 68/M with PMH of DM, HTN, Afib, h/o bioprosthetic valve replacement in 2008, h/o tenosynovitis and I&D in 8/15 presented to the ER with generalized malaise, chest pain, generalized pain for 4-5days, found to have Afib with RVR and cards admitting him for this and [facility] consulted.  He had not been taking his oral hypoglycemics for months now, his CBGs have been running in 300s-400s now for weeks.In addition he also felt poorly and had diarrhea and chills over this past weekend but no fevers. He saw Dr.[name] to FU his R finger problem on 11/2 and was scheduled to have his finger nail removed the next week and a skin graft placed.  HPI/Subjective: Alert sitting up in bed. Endorses he feels like he's dehydrated. Admits to nonadherence to medication therapy as well as physician follow-up regarding his diabetes.  Assessment/Plan:    Diabetic ketoacidosis Hemoglobin A1c greater than 11 consistent with poorly controlled diabetes over time-has transitioned to Lantus-will need insulin at discharge-continue Glucotrol with insulin for now in addition to sliding scale-at discharge will discontinue Glucotrol and continue metformin with Lantus to minimize hypoglycemic episodes-diabetes educator consulted    Atrial fibrillation with RVR Per primary team cardiology-Cardizem infusion transitioned to amiodarone infusion-suspect driven by underlying dehydration from DKA-if RVR persists cardiology plans DCCV 11/6    Dehydration Due to DKA-continue IV fluids but increase rate to 150 mL per hour-Hold home Lasix    Cardiomyopathy EF in March 45-50% with associated grade 2 diastolic dysfunction-echo pending this admission after heart rate better controlled    Hypertension Blood pressure soft and setting of tachycardia and volume depletion (on  carvedilol and Cozaar at home)    Leukocytosis Thus far have been unable to elucidate any source for infectious etiology-suspect underlying cause is low perfusion from RVR in setting of DKA and dehydration    H/O aortic valve replacement with tissue graft    Tenosynovitis of finger Exam not consistent with infectious findings    Patient non adherence Discussed at length with patient-okay with plans to transition to insulin at discharge along with metformin-agrees diabetes education consult would be beneficial   DVT prophylaxis: eliquis Code Status: full Family Communication: no family bedside Disposition Plan/Expected LOS: at discretion of primary team   Procedures: 2-D echocardiogram pending  Cultures: Blood cultures 2 pending  Antibiotics: none  Objective: Blood pressure 115/72, pulse 112, temperature 98.2 F (36.8 C), temperature source Oral, resp. rate 21, height 6' (1.829 m), weight 214 lb 4.6 oz (97.2 kg), SpO2 100 %.  Intake/Output Summary (Last 24 hours) at 07/22/14 1252 Last data filed at 07/22/14 1100  Gross per 24 hour  Intake 3726.66 ml  Output   2250 ml  Net 1476.66 ml     Exam: Gen: No acute respiratory distress Chest: Clear to auscultation bilaterally without wheezes, rhonchi or crackles, room air Cardiac: Irregular, tachycardic rate and rhythm, S1-S2, no rubs murmurs or gallops, no peripheral edema, no JVD Abdomen: Soft nontender nondistended without obvious hepatosplenomegaly, no ascites Extremities: Symmetrical in appearance without cyanosis, clubbing or effusion  Scheduled Meds:  Scheduled Meds: . apixaban  5 mg Oral BID  . aspirin  324 mg Oral NOW   Or  . aspirin  300 mg Rectal NOW  . aspirin EC  81 mg Oral Daily  . atorvastatin  10 mg Oral q morning - 10a  . [  START ON 07/23/2014] glipiZIDE  5 mg Oral QAC breakfast  . insulin aspart  0-5 Units Subcutaneous QHS  . insulin aspart  0-9 Units Subcutaneous TID WC  . insulin glargine  20 Units  Subcutaneous QHS   Continuous Infusions: . sodium chloride 150 mL/hr at 07/22/14 0802  . amiodarone 60 mg/hr (07/22/14 1210)   Followed by  . amiodarone      Data Reviewed: Basic Metabolic Panel:  Recent Labs Lab 07/21/14 1621 07/21/14 1917 07/21/14 2248 07/22/14 0240 07/22/14 0419  NA 138 136* 136* 135* 136*  K 3.8 3.6* 3.6* 3.8 3.7  CL 100 100 101 102 103  CO2 19 22 24 19  18*  GLUCOSE 238* 302* 95 139* 196*  BUN 19 22 22 20 19   CREATININE 0.70 0.74 0.76 0.62 0.56  CALCIUM 9.1 8.5 8.6 8.5 8.4  MG 2.1  --   --   --   --    Liver Function Tests: No results for input(s): AST, ALT, ALKPHOS, BILITOT, PROT, ALBUMIN in the last 168 hours. No results for input(s): LIPASE, AMYLASE in the last 168 hours. No results for input(s): AMMONIA in the last 168 hours. CBC:  Recent Labs Lab 07/21/14 0959 07/22/14 0240  WBC 18.8* 20.4*  NEUTROABS 15.1*  --   HGB 15.4 13.3  HCT 44.4 39.0  MCV 82.1 82.8  PLT 299 301   Cardiac Enzymes:  Recent Labs Lab 07/21/14 1012 07/21/14 1621 07/21/14 1917 07/22/14 0248  TROPONINI <0.30 <0.30 <0.30 <0.30   BNP (last 3 results) No results for input(s): PROBNP in the last 8760 hours. CBG:  Recent Labs Lab 07/22/14 0106 07/22/14 0210 07/22/14 0307 07/22/14 0655 07/22/14 0801  GLUCAP 111* 134* 140* 230* 239*    Recent Results (from the past 240 hour(s))  Culture, blood (routine x 2)     Status: None (Preliminary result)   Collection Time: 07/21/14  4:26 PM  Result Value Ref Range Status   Specimen Description BLOOD RIGHT HAND  Final   Special Requests BOTTLES DRAWN AEROBIC AND ANAEROBIC 5CC  Final   Culture  Setup Time   Final    07/21/2014 22:18 Performed at [facility]    Culture   Final           BLOOD CULTURE RECEIVED NO GROWTH TO DATE CULTURE WILL BE HELD FOR 5 DAYS BEFORE ISSUING A FINAL NEGATIVE REPORT Performed at [facility]    Report Status PENDING  Incomplete  Culture, blood (routine x 2)      Status: None (Preliminary result)   Collection Time: 07/21/14  4:32 PM  Result Value Ref Range Status   Specimen Description BLOOD LEFT ARM  Final   Special Requests BOTTLES DRAWN AEROBIC AND ANAEROBIC 10CC  Final   Culture  Setup Time   Final    07/21/2014 22:17 Performed at [facility]    Culture   Final           BLOOD CULTURE RECEIVED NO GROWTH TO DATE CULTURE WILL BE HELD FOR 5 DAYS BEFORE ISSUING A FINAL NEGATIVE REPORT Performed at [facility]    Report Status PENDING  Incomplete  MRSA PCR Screening     Status: None   Collection Time: 07/21/14  6:41 PM  Result Value Ref Range Status   MRSA by PCR NEGATIVE NEGATIVE Final    Comment:        The GeneXpert MRSA Assay (FDA approved for NASAL specimens only), is one component of  a comprehensive MRSA colonization surveillance program. It is not intended to diagnose MRSA infection nor to guide or monitor treatment for MRSA infections.      Studies:  Recent x-ray studies have been reviewed in detail by the Attending Physician  Time spent :      Erin Hearing, [facility] Office  [phone] Pager [phone]   **If unable to reach the above provider after paging please contact the [facility] @ [phone]  On-Call/Text Page:      [email or web address]      password [email or web address]  If 7PM-7AM, please contact night-coverage [email or web address] Password [email or web address] 07/22/2014, 12:52 PM   LOS: 1 day   Examined patient and discussed assessment and plan with ANP Ebony Hail and agree with above. Patient with multiple complex medical problems> 35 minutes spent in direct patient care

## 2014-07-22 NOTE — Progress Notes (Signed)
Pt. HR sustaining in the 120's-130's afib RVR. BP stable pt. Denies any chest pain, palpitation or short of breath. Dr. Ashok Norris made aware with orders made to bolus 150mg .Amiodarone Iv x1. Will continue to monitor.

## 2014-07-22 NOTE — Progress Notes (Signed)
Received Diabetes Coordinator consult.  Spoke with patient about his past history of diabetes. Was initially diagnosed in 1993 and was started on oral medications.  Had heart surgery in Sept. 2008 and was on insulin flexpen for about 6 months at that time.  Has most recently to have taken Glipizide and Metformin, but has only been taking it sparatically for about 3-4 months. Has been working 10-12 hours per day for five or seven days a week. Works 10 am to 8 pm. Eats breakfast before leaving for work, lunch at 2 pm, and dinner after 8 pm.  PCP is Dr. Leighton Ruff, but has missed some appointments because of work schedule. Was able to show him the insulin pen which he was very familiar with since he had used it in 2008.  Case management looking into coverage of pen with insurance. HgbA1c is 11.8% which is down from a previous 13%.  Encouraged him to check blood sugars at home, knows how to treat low blood sugar. Will have staff RNs to give patient Brayton Layman notes on DM survival skills before discharge.  Will continue to follow while in hospital. Harvel Ricks RN BSN CDE

## 2014-07-22 NOTE — Progress Notes (Signed)
Patient sat up on side of bed to urinate heart rate went up 140's sustaining .Calll placed to Dr.Whitlock.

## 2014-07-22 NOTE — Progress Notes (Signed)
Called by RN everytime pt stands to void he has RVR in his new atrial fib.  Over the night he has rec'd several boluses of IV lopressor due to soft BP.  Was not on dilt drip when pt arrived to floor and BP has been in the 90s.  We will give IV amiodarone bolus over 30 min and begin IV amiodarone drip.  Pt is NPO for possible DCCV today.  His glucose is more elevated so going back on glucomander, followed by IM.

## 2014-07-22 NOTE — Progress Notes (Signed)
Subjective: Feels "half and half".  Some sharp CP last night.   Objective: Vital signs in last 24 hours: Temp:  [97.8 F (36.6 C)-99 F (37.2 C)] 97.8 F (36.6 C) (11/05 0517) Pulse Rate:  [26-151] 147 (11/05 0715) Resp:  [8-33] 27 (11/05 0715) BP: (67-131)/(29-95) 100/85 mmHg (11/05 0715) SpO2:  [90 %-100 %] 100 % (11/05 0715) Weight:  [214 lb 4.6 oz (97.2 kg)-215 lb (97.523 kg)] 214 lb 4.6 oz (97.2 kg) (11/05 0517) Last BM Date: 07/21/14  Intake/Output from previous day: 11/04 0701 - 11/05 0700 In: 3000 [I.V.:3000] Out: 1050 [Urine:1050] Intake/Output this shift:    Medications Current Facility-Administered Medications  Medication Dose Route Frequency Provider Last Rate Last Dose  . 0.9 %  sodium chloride infusion   Intravenous Continuous Samella Parr, NP   Stopped at 07/21/14 1620  . acetaminophen (TYLENOL) tablet 650 mg  650 mg Oral Q4H PRN [name], MD      . amiodarone (NEXTERONE) 1.8 mg/mL load via infusion 150 mg  150 mg Intravenous Once Isaiah Serge, NP       Followed by  . amiodarone (NEXTERONE PREMIX) 360 MG/200ML (1.8 mg/mL) IV infusion  60 mg/hr Intravenous Continuous Isaiah Serge, NP       Followed by  . amiodarone (NEXTERONE PREMIX) 360 MG/200ML (1.8 mg/mL) IV infusion  30 mg/hr Intravenous Continuous Isaiah Serge, NP      . apixaban Arne Cleveland) tablet 5 mg  5 mg Oral BID [name], MD   5 mg at 07/21/14 2247  . aspirin chewable tablet 324 mg  324 mg Oral NOW [name], MD       Or  . aspirin suppository 300 mg  300 mg Rectal NOW [name], MD      . aspirin EC tablet 81 mg  81 mg Oral Daily [name], MD      . atorvastatin (LIPITOR) tablet 10 mg  10 mg Oral q morning - 10a [name], MD      . dextrose 50 % solution 25 mL  25 mL Intravenous PRN Domenic Polite, MD      . Influenza vac split quadrivalent PF (FLUARIX) injection 0.5 mL  0.5 mL Intramuscular Prior to discharge [name], MD      . insulin aspart (novoLOG) injection 0-15  Units  0-15 Units Subcutaneous 6 times per day Samella Parr, NP      . insulin glargine (LANTUS) injection 20 Units  20 Units Subcutaneous QHS Jeryl Columbia, NP   20 Units at 07/22/14 0108  . insulin regular bolus via infusion 0-10 Units  0-10 Units Intravenous TID WC Domenic Polite, MD      . ondansetron [facility]) injection 4 mg  4 mg Intravenous Q6H PRN [name], MD      . pneumococcal 23 valent vaccine (PNU-IMMUNE) injection 0.5 mL  0.5 mL Intramuscular Tomorrow-1000 [name], MD        PE: General appearance: alert, cooperative and no distress Lungs: clear to auscultation bilaterally Heart: reg and fast.  No MM Abdomen: +BS Extremities: No LEE Pulses: 2+ radials.  2+ right DP.  0 left.  Feet cool.  Skin: dry.  Neurologic: Grossly normal  Lab Results:   Recent Labs  07/21/14 0959 07/22/14 0240  WBC 18.8* 20.4*  HGB 15.4 13.3  HCT 44.4 39.0  PLT 299 301   BMET  Recent Labs  07/21/14 2248 07/22/14 0240 07/22/14 0419  NA 136* 135*  136*  K 3.6* 3.8 3.7  CL 101 102 103  CO2 24 19 18*  GLUCOSE 95 139* 196*  BUN 22 20 19   CREATININE 0.76 0.62 0.56  CALCIUM 8.6 8.5 8.4    Assessment/Plan 68 year old male with paroxysmal atrial fibrillation, stopped amiodarone in 2013 after no further episodes, with prior episode of postoperative atrial fibrillation following bioprosthetic aortic valve replacement in 2008 here with atypical chest discomfort and atrial fibrillation with rapid ventricular response, general malaise  Principal Problem:   Diabetic ketoacidosis Active Problems:   Cardiomyopathy  Appears euvolemic    H/O aortic valve replacement with tissue graft   Afib Patient in Afib RVR.  IV amiodarone ordered this morning.  On apixaban.  BP soft.  Echo pending but needs to be done after his HR slows down.   TEE/ DCCV tomorrow afternoon if he does not convert.    Tenosynovitis of finger   Diabetes mellitus   DKA (diabetic ketoacidoses)  Improved    Hypokalemia  On the low side of normal.   Difficulty urinating.      Bladder scan this morning.  May need IO cath.    LOS: 1 day    HAGER, BRYAN PA-C 07/22/2014 7:39 AM  Discussed TEE/DCC with patient should have 3 doses eliquis by Friday afternoon.  Continue amiodarone Appreciate IM care of DKA  Jenkins Rouge

## 2014-07-22 NOTE — Progress Notes (Signed)
Patient anion gap 11 and last two blood sugars 116 and 109 remains on insulin drip.Call placed to Chaney Malling NP. Orders received to transition patient off insulin drip.New order received for 20 units of lantus  Insulin sq.

## 2014-07-22 NOTE — Progress Notes (Signed)
Dr.Whitlock returned call notified of patient condition.New order received for 250 normal saline bolus times one and given.

## 2014-07-22 NOTE — Progress Notes (Signed)
Patient co2  18 anion gap 15 per morning labs .Thomas Malling  NP notified .Patient to be restarted back on insulin drip.

## 2014-07-22 NOTE — Progress Notes (Signed)
Patient sitting up on side of bed to urinate heart rate back up 140-150 sustaining .Call placed to MD.

## 2014-07-22 NOTE — Plan of Care (Signed)
Problem: Phase I Progression Outcomes Goal: Pain controlled with appropriate interventions Outcome: Progressing     

## 2014-07-22 NOTE — Progress Notes (Signed)
Dr.Whiltlock in to check on patient .Patient heart rate remains elevated 140's. New orders received.

## 2014-07-22 NOTE — Progress Notes (Signed)
Called to patient room .Patient complaining of squeezing pain under left breast that comes and goes rates 5/10.Patient blood pressure ranging 73-91/29-52 heart rate sustaining 150.12 lead ekg  Obtained and call placed to Dr.Whitlock.

## 2014-07-23 ENCOUNTER — Encounter (HOSPITAL_COMMUNITY): Payer: Self-pay | Admitting: *Deleted

## 2014-07-23 ENCOUNTER — Inpatient Hospital Stay (HOSPITAL_COMMUNITY): Payer: Medicare HMO | Admitting: Certified Registered Nurse Anesthetist

## 2014-07-23 ENCOUNTER — Encounter (HOSPITAL_COMMUNITY)
Admission: EM | Disposition: A | Discharge status: Home Health | Payer: Commercial Managed Care - HMO | Source: Home / Self Care | Attending: Internal Medicine

## 2014-07-23 ENCOUNTER — Inpatient Hospital Stay (HOSPITAL_COMMUNITY): Payer: Medicare HMO

## 2014-07-23 DIAGNOSIS — I341 Nonrheumatic mitral (valve) prolapse: Secondary | ICD-10-CM

## 2014-07-23 HISTORY — PX: TEE WITHOUT CARDIOVERSION: SHX5443

## 2014-07-23 HISTORY — PX: CARDIOVERSION: SHX1299

## 2014-07-23 LAB — GLUCOSE, CAPILLARY
Glucose-Capillary: 320 mg/dL — ABNORMAL HIGH (ref 70–99)
Glucose-Capillary: 272 mg/dL — ABNORMAL HIGH (ref 70–99)
Glucose-Capillary: 190 mg/dL — ABNORMAL HIGH (ref 70–99)
Glucose-Capillary: 187 mg/dL — ABNORMAL HIGH (ref 70–99)
Glucose-Capillary: 219 mg/dL — ABNORMAL HIGH (ref 70–99)
Glucose-Capillary: 215 mg/dL — ABNORMAL HIGH (ref 70–99)
Glucose-Capillary: 375 mg/dL — ABNORMAL HIGH (ref 70–99)

## 2014-07-23 LAB — COMPREHENSIVE METABOLIC PANEL
ALBUMIN: 2.5 g/dL — AB (ref 3.5–5.2)
ALT: 10 U/L (ref 0–53)
AST: 14 U/L (ref 0–37)
Alkaline Phosphatase: 219 U/L — ABNORMAL HIGH (ref 39–117)
Anion gap: 15 (ref 5–15)
BILIRUBIN TOTAL: 0.3 mg/dL (ref 0.3–1.2)
BUN: 10 mg/dL (ref 6–23)
CO2: 19 meq/L (ref 19–32)
Calcium: 8.4 mg/dL (ref 8.4–10.5)
Chloride: 99 mEq/L (ref 96–112)
Creatinine, Ser: 0.54 mg/dL (ref 0.50–1.35)
GFR calc Af Amer: >90 mL/min (ref >90)
Glucose, Bld: 217 mg/dL — ABNORMAL HIGH (ref 70–99)
Potassium: 3.9 mEq/L (ref 3.7–5.3)
SODIUM: 133 meq/L — AB (ref 137–147)
Total Protein: 6.2 g/dL (ref 6.0–8.3)

## 2014-07-23 LAB — CBC
HCT: 39.7 % (ref 39.0–52.0)
Hemoglobin: 13.6 g/dL (ref 13.0–17.0)
MCH: 27.9 pg (ref 26.0–34.0)
MCHC: 34.3 g/dL (ref 30.0–36.0)
MCV: 81.5 fL (ref 78.0–100.0)
PLATELETS: 291 10*3/uL (ref 150–400)
RBC: 4.87 MIL/uL (ref 4.22–5.81)
RDW: 13 % (ref 11.5–15.5)
WBC: 20.6 10*3/uL — ABNORMAL HIGH (ref 4.0–10.5)

## 2014-07-23 LAB — PROTIME-INR
INR: 1.27 (ref 0.00–1.49)
PROTHROMBIN TIME: 16 s — AB (ref 11.6–15.2)

## 2014-07-23 SURGERY — ECHOCARDIOGRAM, TRANSESOPHAGEAL
Anesthesia: Monitor Anesthesia Care

## 2014-07-23 MED ORDER — PROMETHAZINE HCL 25 MG/ML IJ SOLN: 6.2500 mg | INTRAMUSCULAR | Status: DC | PRN

## 2014-07-23 MED ORDER — ETOMIDATE 2 MG/ML IV SOLN
INTRAVENOUS | Status: DC | PRN
  Administered 2014-07-23: 4 mg via INTRAVENOUS

## 2014-07-23 MED ORDER — OXYCODONE HCL 5 MG PO TABS
5.0000 mg | ORAL_TABLET | Freq: Once | ORAL | Status: AC | PRN
  Administered 2014-07-23: 5 mg via ORAL
  Filled 2014-07-23: qty 1

## 2014-07-23 MED ORDER — FENTANYL CITRATE 0.05 MG/ML IJ SOLN: 25.0000 ug | INTRAMUSCULAR | Status: DC | PRN

## 2014-07-23 MED ORDER — CARVEDILOL 12.5 MG PO TABS
12.5000 mg | ORAL_TABLET | Freq: Two times a day (BID) | ORAL | Status: DC
  Administered 2014-07-23 – 2014-07-24 (×3): 12.5 mg via ORAL
  Filled 2014-07-23 (×5): qty 1

## 2014-07-23 MED ORDER — MIDAZOLAM HCL 2 MG/2ML IJ SOLN: 0.5000 mg | Freq: Once | INTRAMUSCULAR | Status: AC | PRN

## 2014-07-23 MED ORDER — FENTANYL CITRATE 0.05 MG/ML IJ SOLN
INTRAMUSCULAR | Status: DC | PRN
  Administered 2014-07-23 (×2): 50 ug via INTRAVENOUS

## 2014-07-23 MED ORDER — INSULIN STARTER KIT- PEN NEEDLES (ENGLISH)
1.0000 | Freq: Once | Status: AC
  Administered 2014-07-23: 1
  Filled 2014-07-23: qty 1

## 2014-07-23 MED ORDER — MIDAZOLAM HCL 5 MG/5ML IJ SOLN
INTRAMUSCULAR | Status: DC | PRN
  Administered 2014-07-23: 2 mg via INTRAVENOUS

## 2014-07-23 MED ORDER — MORPHINE SULFATE 2 MG/ML IJ SOLN
2.0000 mg | Freq: Once | INTRAMUSCULAR | Status: AC
  Administered 2014-07-23: 2 mg via INTRAVENOUS
  Filled 2014-07-23: qty 1

## 2014-07-23 MED ORDER — BUTAMBEN-TETRACAINE-BENZOCAINE 2-2-14 % EX AERO
INHALATION_SPRAY | CUTANEOUS | Status: DC | PRN
  Administered 2014-07-23: 2 via TOPICAL

## 2014-07-23 MED ORDER — MEPERIDINE HCL 25 MG/ML IJ SOLN: 6.2500 mg | INTRAMUSCULAR | Status: DC | PRN

## 2014-07-23 MED ORDER — LACTATED RINGERS IV SOLN
INTRAVENOUS | Status: DC | PRN
  Administered 2014-07-23: 14:00:00 via INTRAVENOUS

## 2014-07-23 MED ORDER — OXYCODONE HCL 5 MG/5ML PO SOLN: 5.0000 mg | Freq: Once | ORAL | Status: AC | PRN

## 2014-07-23 NOTE — Progress Notes (Signed)
EKG CRITICAL VALUE     12 lead EKG performed.  Critical value noted.  Mammie Russian, RN notified.   Kooper Godshall C, CCT 07/23/2014 7:33 AM

## 2014-07-23 NOTE — CV Procedure (Signed)
   Transesophageal Echocardiogram: Indication: Afib Sedation: Versed: 2, Fentanyl: 100, Other: Etomidante  ASA: 3, Airway: 2  Procedure:  The patient was moderately sedated with the above doses of versed and fentanyl.  Using digital technique an omniplane probe was advanced into the distal esophagus without incident. Transgastric imaging revealed a dilated LV EF 25-30%  No mural apical thrombus   Right sided cardiac chambers were normal with no evidence of pulmonary hypertension.  The pulmonary and tricuspid valves were structurally normal.  There was moderate TR The mitral valve was structurally normal with mild mitral regurgitation.    The tissue bioprosthetic AVR was well seated with trivial central AR and normal leaflet motion Mean gradient 12 mm Hg The aortic root was normal.    Imaging of the septum showed no ASD or VSD 2D and color flow confirmed no PFO  There was mild LAE  The LAA was very large.  There appeared to be a high burden of more calcified thrombus  The descending thoracic aorta had mild  mural aortic debris with no evidence of aneurysmal dilation or disection  Impression:  1) Enlarged LAA with calcified thrombus  Normal RAA 2) EF 25-30% 3) Normal appearing tissue AVR mean gradient 12 mmHg with trivial central AR 4) Mild MR 5) Normal RV 6) No ASD/PFO 7) Mild mural aortic debris 6) Mild LAE 7) Normal Pulmonary valve 8) Normal TV with moderate TR   Jenkins Rouge 07/23/2014 2:45 PM

## 2014-07-23 NOTE — Progress Notes (Signed)
Patient ID: Thomas Wright, male   DOB: 02-Mar-1946, 68 y.o.   MRN: [id number] Patient tolerated TEE well Has thrombus in LAA Unable to [facility] Continue eliquis Coreg added back for rate control CHF improving  Jenkins Rouge

## 2014-07-23 NOTE — Anesthesia Preprocedure Evaluation (Addendum)
Anesthesia Evaluation  Patient identified by MRN, date of birth, ID band Patient awake    Reviewed: Allergy & Precautions, H&P , NPO status , Patient's Chart, lab work & pertinent test results  History of Anesthesia Complications Negative for: history of anesthetic complications  Airway Mallampati: II  TM Distance: >3 FB Neck ROM: Full    Dental  (+) Teeth Intact, Dental Advisory Given   Pulmonary neg pulmonary ROS,  breath sounds clear to auscultation        Cardiovascular hypertension, Pt. on medications and Pt. on home beta blockers - angina+ dysrhythmias Atrial Fibrillation + Valvular Problems/Murmurs (h/o tissue AVR) Rhythm:Irregular Rate:Normal  11/15 ECHO: EF 25-30% valves OK   Neuro/Psych negative neurological ROS     GI/Hepatic negative GI ROS, Neg liver ROS,   Endo/Other  diabetes (glu 219), Oral Hypoglycemic Agents  Renal/GU negative Renal ROS     Musculoskeletal   Abdominal   Peds  Hematology  (+) Blood dyscrasia (INR 1.27), ,   Anesthesia Other Findings   Reproductive/Obstetrics                          Anesthesia Physical Anesthesia Plan  ASA: III  Anesthesia Plan: MAC   Post-op Pain Management:    Induction: Intravenous  Airway Management Planned: Natural Airway and Nasal Cannula  Additional Equipment:   Intra-op Plan:   Post-operative Plan:   Informed Consent: I have reviewed the patients History and Physical, chart, labs and discussed the procedure including the risks, benefits and alternatives for the proposed anesthesia with the patient or authorized representative who has indicated his/her understanding and acceptance.   Dental advisory given  Plan Discussed with: Anesthesiologist, Surgeon and CRNA  Anesthesia Plan Comments: (Plan routine monitors, MAC)       Anesthesia Quick Evaluation

## 2014-07-23 NOTE — Progress Notes (Signed)
Thomas Wright 1 - Stepdown / ICU Consultant Note  PILOT PRINDLE OMA:[id number] DOB: 03-31-1946 DOA: 07/21/2014 PCP: [name], MD   Brief narrative: 68/M with PMH of DM, HTN, Afib, h/o bioprosthetic valve replacement in 2008, h/o tenosynovitis and I&D in 8/15 presented to the ER with generalized malaise, chest pain, generalized pain for 4-5days, found to have Afib with RVR and cards admitting him for this and [facility] consulted.  He had not been taking his oral hypoglycemics for months now, his CBGs have been running in 300s-400s now for weeks.In addition he also felt poorly and had diarrhea and chills over this past weekend but no fevers. He saw Dr.[name] to FU his R finger problem on 11/2 and was scheduled to have his finger nail removed the next week and a skin graft placed.  HPI/Subjective: Alert and denies SOB or orthopnea  Assessment/Plan:    Diabetic ketoacidosis Hemoglobin A1c > 11 c/w poorly controlled diabetes -now on Lantus-will need insulin at discharge-continue Glucotrol with insulin for now w/ SSI-at dc will stop Glucotrol and continue metformin with Lantus to minimize hypoglycemic episodes-diabetes educator consulted-pre authorization for Lantus Flexpen completed 11/6/decision pending    Atrial fibrillation with RVR Per primary team cardiology-Cardizem infusion transitioned to amiodarone infusion-suspect driven by underlying dehydration from DKA-cardiology plans DCCV today (11/6)    Dehydration Resolved- Due to DKA-decrease IVFs to kvo since FU CXR 11/6 now with mild edema-may need to resume Lasix soon- suspect CXR will improve once RVR resolved    Acute respiratory failure with hypoxia due to progressive systolic dysfunction EF 14-23% EF in March 45-50% with associated grade 2 diastolic dysfunction-echo this admission demonstrated worsened EF now down to 25% and likely mediated by persistent tachycardia-cont oxygen    Hypertension Blood pressure soft in  setting of tachycardia and volume depletion (on Carvedilol and Cozaar at home)    Leukocytosis Thus far have been unable to elucidate any source for infectious etiology-suspect underlying cause is low perfusion from RVR in setting of DKA and dehydration    H/O aortic valve replacement with tissue graft    Tenosynovitis of finger Exam not consistent with infectious findings    Patient non adherence Discussed at length with patient 11/5-okay with plans to transition to insulin at discharge along with metformin-agrees diabetes education consult would be beneficial   DVT prophylaxis: eliquis Code Status: full Family Communication: no family bedside Disposition Plan/Expected LOS: at discretion of primary team   Procedures: 2-D echocardiogram: - Left ventricle: The cavity size was normal. Wall thickness was increased in a pattern of severe LVH. Systolic function was severely reduced. The estimated ejection fraction was in the range of 25% to 30%. - Aortic valve: Peak and mean gradients through the AV prosthesis are 20 and 11 mm Hg respectively. Appears to open well, is well seated. There was trivial regurgitation. - Mitral valve: There was mild regurgitation. - Left atrium: The atrium was mildly dilated.  Cultures: Blood cultures 2 pending  Antibiotics: none  Objective: Blood pressure 108/75, pulse 121, temperature 100 F (37.8 C), temperature source Oral, resp. rate 26, height 6' (1.829 m), weight 216 lb 7.9 oz (98.2 kg), SpO2 99 %.  Intake/Output Summary (Last 24 hours) at 07/23/14 1022 Last data filed at 07/23/14 0700  Gross per 24 hour  Intake 2846.41 ml  Output   3200 ml  Net -353.59 ml     Exam: Gen: No acute respiratory distress Chest: Fine bibasilar crackles, 2L Cardiac: Irregular, tachycardic rate and  rhythm, S1-S2, no rubs murmurs or gallops, no peripheral edema, no JVD Abdomen: Soft nontender nondistended without obvious hepatosplenomegaly, no  ascites Extremities: Symmetrical in appearance without cyanosis, clubbing or effusion  Scheduled Meds:  Scheduled Meds: . apixaban  5 mg Oral BID  . aspirin EC  81 mg Oral Daily  . atorvastatin  10 mg Oral q morning - 10a  . carvedilol  12.5 mg Oral BID WC  . glipiZIDE  5 mg Oral QAC breakfast  . insulin aspart  0-5 Units Subcutaneous QHS  . insulin aspart  0-9 Units Subcutaneous TID WC  . insulin glargine  20 Units Subcutaneous QHS  . insulin starter kit- pen needles  1 kit Other Once  . living well with diabetes book   Does not apply Once  . polyethylene glycol  17 g Oral Daily   Continuous Infusions: . sodium chloride 150 mL/hr at 07/22/14 2125  . sodium chloride 20 mL/hr at 07/22/14 2354  . amiodarone 30 mg/hr (07/23/14 0700)    Data Reviewed: Basic Metabolic Panel:  Recent Labs Lab 07/21/14 1621 07/21/14 1917 07/21/14 2248 07/22/14 0240 07/22/14 0419 07/23/14 0230  NA 138 136* 136* 135* 136* 133*  K 3.8 3.6* 3.6* 3.8 3.7 3.9  CL 100 100 101 102 103 99  CO2 19 22 24 19  18* 19  GLUCOSE 238* 302* 95 139* 196* 217*  BUN 19 22 22 20 19 10   CREATININE 0.70 0.74 0.76 0.62 0.56 0.54  CALCIUM 9.1 8.5 8.6 8.5 8.4 8.4  MG 2.1  --   --   --   --   --    Liver Function Tests:  Recent Labs Lab 07/23/14 0230  AST 14  ALT 10  ALKPHOS 219*  BILITOT 0.3  PROT 6.2  ALBUMIN 2.5*   No results for input(s): LIPASE, AMYLASE in the last 168 hours. No results for input(s): AMMONIA in the last 168 hours. CBC:  Recent Labs Lab 07/21/14 0959 07/22/14 0240 07/23/14 0230  WBC 18.8* 20.4* 20.6*  NEUTROABS 15.1*  --   --   HGB 15.4 13.3 13.6  HCT 44.4 39.0 39.7  MCV 82.1 82.8 81.5  PLT 299 301 291   Cardiac Enzymes:  Recent Labs Lab 07/21/14 1012 07/21/14 1621 07/21/14 1917 07/22/14 0248  TROPONINI <0.30 <0.30 <0.30 <0.30   BNP (last 3 results) No results for input(s): PROBNP in the last 8760 hours. CBG:  Recent Labs Lab 07/22/14 1557 07/22/14 2101  07/22/14 2258 07/23/14 0518 07/23/14 0752  GLUCAP 266* 320* 272* 190* 187*    Recent Results (from the past 240 hour(s))  Culture, blood (routine x 2)     Status: None (Preliminary result)   Collection Time: 07/21/14  4:26 PM  Result Value Ref Range Status   Specimen Description BLOOD RIGHT HAND  Final   Special Requests BOTTLES DRAWN AEROBIC AND ANAEROBIC 5CC  Final   Culture  Setup Time   Final    07/21/2014 22:18 Performed at [facility]    Culture   Final           BLOOD CULTURE RECEIVED NO GROWTH TO DATE CULTURE WILL BE HELD FOR 5 DAYS BEFORE ISSUING A FINAL NEGATIVE REPORT Performed at [facility]    Report Status PENDING  Incomplete  Culture, blood (routine x 2)     Status: None (Preliminary result)   Collection Time: 07/21/14  4:32 PM  Result Value Ref Range Status   Specimen Description BLOOD LEFT ARM  Final   Special Requests BOTTLES DRAWN AEROBIC AND ANAEROBIC 10CC  Final   Culture  Setup Time   Final    07/21/2014 22:17 Performed at [facility]    Culture   Final           BLOOD CULTURE RECEIVED NO GROWTH TO DATE CULTURE WILL BE HELD FOR 5 DAYS BEFORE ISSUING A FINAL NEGATIVE REPORT Performed at [facility]    Report Status PENDING  Incomplete  MRSA PCR Screening     Status: None   Collection Time: 07/21/14  6:41 PM  Result Value Ref Range Status   MRSA by PCR NEGATIVE NEGATIVE Final    Comment:        The GeneXpert MRSA Assay (FDA approved for NASAL specimens only), is one component of a comprehensive MRSA colonization surveillance program. It is not intended to diagnose MRSA infection nor to guide or monitor treatment for MRSA infections.      Studies:  Recent x-ray studies have been reviewed in detail by the Attending Physician  Time spent :      Erin Hearing, [place] [facility] Office  [phone] Pager [phone]   **If unable to reach the above provider after paging please contact the  [place] @ [phone]  On-Call/Text Page:      [email or web address]      password [email or web address]  If 7PM-7AM, please contact night-coverage [email or web address] Password [email or web address] 07/23/2014, 10:22 AM   LOS: 2 days

## 2014-07-23 NOTE — Progress Notes (Signed)
Pt. Complained of dull pain lt. chest nonradiating, comes and go at scale of 4/10 per patient. BP stable remains in afib  Dr. Ashok Norris made aware with orders made. Morphine 2mg  IV x 1 dose given. Will continue to monitor pt.

## 2014-07-23 NOTE — Transfer of Care (Signed)
Immediate Anesthesia Transfer of Care Note  Patient: Thomas Wright  Procedure(s) Performed: Procedure(s): TRANSESOPHAGEAL ECHOCARDIOGRAM (TEE) (N/A) CARDIOVERSION (N/A)  Patient Location: Endoscopy Unit  Anesthesia Type:MAC  Level of Consciousness: awake, alert  and oriented  Airway & Oxygen Therapy: Patient Spontanous Breathing  Post-op Assessment: Report given to PACU RN and Post -op Vital signs reviewed and stable  Post vital signs: Reviewed and stable  Complications: No apparent anesthesia complications

## 2014-07-23 NOTE — Progress Notes (Signed)
*  PRELIMINARY RESULTS* Echocardiogram Echocardiogram Transesophageal has been performed.  Leavy Cella 07/23/2014, 2:48 PM

## 2014-07-23 NOTE — Progress Notes (Signed)
Patient ID: Thomas Wright, male   DOB: December 28, 1945, 68 y.o.   MRN: [id number]    Subjective: Doesn't feel "right"  Cannot elaborate   Objective: Vital signs in last 24 hours: Temp:  [98 F (36.7 C)-99 F (37.2 C)] 98 F (36.7 C) (11/06 0516) Pulse Rate:  [50-264] 139 (11/06 0700) Resp:  [15-34] 28 (11/06 0700) BP: (76-131)/(44-96) 120/80 mmHg (11/06 0700) SpO2:  [92 %-100 %] 92 % (11/06 0700) Weight:  [98.2 kg (216 lb 7.9 oz)] 98.2 kg (216 lb 7.9 oz) (11/06 0516) Last BM Date: 07/19/14  Intake/Output from previous day: 11/05 0701 - 11/06 0700 In: 3573.1 [P.O.:1200; I.V.:2373.1] Out: 3200 [Urine:3200] Intake/Output this shift:    Medications Current Facility-Administered Medications  Medication Dose Route Frequency Provider Last Rate Last Dose  . 0.9 %  sodium chloride infusion   Intravenous Continuous Samella Parr, NP 150 mL/hr at 07/22/14 2125    . 0.9 %  sodium chloride infusion   Intravenous Continuous Josue Hector, MD 20 mL/hr at 07/22/14 2354    . acetaminophen (TYLENOL) tablet 650 mg  650 mg Oral Q4H PRN [name], MD      . amiodarone (NEXTERONE PREMIX) 360 MG/200ML (1.8 mg/mL) IV infusion  30 mg/hr Intravenous Continuous Isaiah Serge, NP 16.7 mL/hr at 07/23/14 0700 30 mg/hr at 07/23/14 0700  . apixaban (ELIQUIS) tablet 5 mg  5 mg Oral BID [name], MD   5 mg at 07/22/14 2159  . aspirin EC tablet 81 mg  81 mg Oral Daily [name], MD   81 mg at 07/22/14 1031  . atorvastatin (LIPITOR) tablet 10 mg  10 mg Oral q morning - 10a [name], MD   10 mg at 07/22/14 1031  . carvedilol (COREG) tablet 12.5 mg  12.5 mg Oral BID WC Josue Hector, MD      . dextrose 50 % solution 25 mL  25 mL Intravenous PRN Domenic Polite, MD      . glipiZIDE (GLUCOTROL) tablet 5 mg  5 mg Oral QAC breakfast Samella Parr, NP   5 mg at 07/23/14 0657  . Influenza vac split quadrivalent PF (FLUARIX) injection 0.5 mL  0.5 mL Intramuscular Prior to discharge [name], MD      . insulin  aspart (novoLOG) injection 0-5 Units  0-5 Units Subcutaneous QHS Samella Parr, NP   2 Units at 07/22/14 2309  . insulin aspart (novoLOG) injection 0-9 Units  0-9 Units Subcutaneous TID WC Samella Parr, NP   5 Units at 07/22/14 1855  . insulin glargine (LANTUS) injection 20 Units  20 Units Subcutaneous QHS Rhetta Mura Schorr, NP   20 Units at 07/22/14 2308  . insulin starter kit- pen needles (English) 1 kit  1 kit Other Once [name], MD      . living well with diabetes book MISC   Does not apply Once [name], MD      . ondansetron [facility]) injection 4 mg  4 mg Intravenous Q6H PRN [name], MD      . polyethylene glycol (MIRALAX / GLYCOLAX) packet 17 g  17 g Oral Daily Sueanne Margarita, MD   17 g at 07/22/14 2252    PE: General appearance: alert, cooperative and no distress Lungs: clear to auscultation bilaterally Heart: reg and fast.  No MM Abdomen: +BS Extremities: No LEE Pulses: 2+ radials.  2+ right DP.  0 left.  Feet cool.  Skin: dry.  Neurologic: Grossly  normal  Lab Results:   Recent Labs  07/21/14 0959 07/22/14 0240 07/23/14 0230  WBC 18.8* 20.4* 20.6*  HGB 15.4 13.3 13.6  HCT 44.4 39.0 39.7  PLT 299 301 291   BMET  Recent Labs  07/22/14 0240 07/22/14 0419 07/23/14 0230  NA 135* 136* 133*  K 3.8 3.7 3.9  CL 102 103 99  CO2 19 18* 19  GLUCOSE 139* 196* 217*  BUN 20 19 10   CREATININE 0.62 0.56 0.54  CALCIUM 8.5 8.4 8.4    Assessment/Plan 68 year old male with paroxysmal atrial fibrillation, stopped amiodarone in 2013 after no further episodes, with prior episode of postoperative atrial fibrillation following bioprosthetic aortic valve replacement in 2008 here with atypical chest discomfort and atrial fibrillation with rapid ventricular response, general malaise  Principal Problem:   Diabetic ketoacidosis Active Problems:   Cardiomyopathy  Appears euvolemic    H/O aortic valve replacement with tissue graft   Afib Patient in Afib RVR. Start  coreg for rate control  TEE/DCC this afternoon on eliquis     Tenosynovitis of finger   Diabetes mellitus   DKA (diabetic ketoacidoses)  Improved   Hypokalemia  On the low side of normal.    Jenkins Rouge PA-C 07/23/2014 8:13 AM

## 2014-07-23 NOTE — Plan of Care (Signed)
Problem: Phase I Progression Outcomes Goal: Hemodynamically stable Outcome: Progressing     

## 2014-07-23 NOTE — Plan of Care (Signed)
Problem: Phase I Progression Outcomes Goal: Pain controlled with appropriate interventions Outcome: Progressing     

## 2014-07-23 NOTE — Interval H&P Note (Signed)
History and Physical Interval Note:  07/23/2014 2:09 PM  Thomas Wright  has presented today for surgery, with the diagnosis of afib  The various methods of treatment have been discussed with the patient and family. After consideration of risks, benefits and other options for treatment, the patient has consented to  Procedure(s): TRANSESOPHAGEAL ECHOCARDIOGRAM (TEE) (N/A) CARDIOVERSION (N/A) as a surgical intervention .  The patient's history has been reviewed, patient examined, no change in status, stable for surgery.  I have reviewed the patient's chart and labs.  Questions were answered to the patient's satisfaction.     Jenkins Rouge

## 2014-07-23 NOTE — Anesthesia Postprocedure Evaluation (Signed)
  Anesthesia Post-op Note  Patient: Thomas Wright  Procedure(s) Performed: Procedure(s): TRANSESOPHAGEAL ECHOCARDIOGRAM (TEE) (N/A) CARDIOVERSION (N/A)  Patient Location: Endoscopy Unit  Anesthesia Type:MAC  Level of Consciousness: awake, alert , oriented and patient cooperative  Airway and Oxygen Therapy: Patient Spontanous Breathing  Post-op Pain: none  Post-op Assessment: Post-op Vital signs reviewed, Patient's Cardiovascular Status Stable, Respiratory Function Stable, Patent Airway, No signs of Nausea or vomiting and Pain level controlled  Post-op Vital Signs: Reviewed and stable  Last Vitals:  Filed Vitals:   07/23/14 1505  BP: 119/51  Pulse: 70  Temp:   Resp: 26    Complications: No apparent anesthesia complications

## 2014-07-23 NOTE — Care Management Note (Addendum)
    Page 1 of 2   08/06/2014     4:19:50 PM CARE MANAGEMENT NOTE 08/06/2014  Patient:  Thomas Wright, Thomas Wright   Account Number:  [id number]  Date Initiated:  07/22/2014  Documentation initiated by:  MAYO,HENRIETTA  Subjective/Objective Assessment:   dx DKA/AFib w/RVR; lives with significant other    PCP  Leighton Ruff     Action/Plan:   CM to follow for dispostion needs   Anticipated DC Date:  08/09/2014   Anticipated DC Plan:  [place]  CM consult  Medication Assistance      [facility] Choice  HOME HEALTH   Choice offered to / List presented to:  C-1 Patient        [place] arranged  HH-1 RN  [place].   Status of service:  Completed, signed off Medicare Important Message given?  YES (If response is "NO", the following Medicare IM given date fields will be blank) Date Medicare IM given:  07/27/2014 Medicare IM given by:  MAYO,HENRIETTA Date Additional Medicare IM given:  08/05/2014 Additional Medicare IM given by:  [facility] MAYO  Discharge Disposition:    Per UR Regulation:  Reviewed for med. necessity/level of care/duration of stay  If discussed at [place] of Stay Meetings, dates discussed:   07/27/2014  08/03/2014  08/05/2014    Comments:  Eliquis  COVERED; $45 CO-PAY AT [facility]; NO AUTH REQUIRED 30 day free card provided to patient Eliquis medication assistance form provided as additional resource if needed. Dispo Plan:  Home with HHS:  RN, PT [facility] aware)  08/05/14 [place] RN MSN BSN CCM PT recommends home therapy and pt agrees.  [place] liaison notified.  08/04/14 [place] MSN BSN CCM D/C planned for 11/17, pt became diaphoretic, O2 sats 80s, PO2 60s, CBG 36, Temp 91.1.  D/C cancelled, pt on venturi mask, 2 amps D50 administered.  11/16  1127 debbie dowell rn,bsn went over hhc agency list. no pref. ref to [facility] w adv homecare for hhrn for perirectal  abscess wound care.  11-12 15  9:30am [place] [phone] Eliquis is COVERED by insurance; $45 CO-PAY AT [employer]; NO AUTH REQUIRED - given 30 day trail card.  07/27/14 0930 [place] Mayo RN MSN BSN CCM Surgical consult for I and D of peri-rectal abscess.  07/26/14 [place] RN MSN BSN CCM Auth received for [employer].  07/23/14 [place] MSN BSN CCM Prior auth form completed and submitted by Erin Hearing NP.  Pt states he has decided he would prefer the 30-day script to fill locally.  07/22/14 [place] MSN BSN CCM Copay for [employer] will be $45/30 day supply retail or $125/90 day supply via mail order.  REQUIRES PRIOR AUTH. Discussed with pt, 30 day RX can be filled @ [place], or [facility].

## 2014-07-24 LAB — BASIC METABOLIC PANEL
Anion gap: 14 (ref 5–15)
BUN: 8 mg/dL (ref 6–23)
CALCIUM: 8.2 mg/dL — AB (ref 8.4–10.5)
CO2: 21 mEq/L (ref 19–32)
CREATININE: 0.55 mg/dL (ref 0.50–1.35)
Chloride: 96 mEq/L (ref 96–112)
GFR calc Af Amer: >90 mL/min (ref >90)
GLUCOSE: 235 mg/dL — AB (ref 70–99)
POTASSIUM: 3.8 meq/L (ref 3.7–5.3)
Sodium: 131 mEq/L — ABNORMAL LOW (ref 137–147)

## 2014-07-24 LAB — GLUCOSE, CAPILLARY
GLUCOSE-CAPILLARY: 237 mg/dL — AB (ref 70–99)
Glucose-Capillary: 209 mg/dL — ABNORMAL HIGH (ref 70–99)
Glucose-Capillary: 292 mg/dL — ABNORMAL HIGH (ref 70–99)

## 2014-07-24 LAB — CBC
HCT: 37.5 % — ABNORMAL LOW (ref 39.0–52.0)
Hemoglobin: 12.8 g/dL — ABNORMAL LOW (ref 13.0–17.0)
MCH: 27.9 pg (ref 26.0–34.0)
MCHC: 34.1 g/dL (ref 30.0–36.0)
MCV: 81.9 fL (ref 78.0–100.0)
PLATELETS: 277 10*3/uL (ref 150–400)
RBC: 4.58 MIL/uL (ref 4.22–5.81)
RDW: 12.9 % (ref 11.5–15.5)
WBC: 20.5 10*3/uL — ABNORMAL HIGH (ref 4.0–10.5)

## 2014-07-24 MED ORDER — OXYCODONE HCL 5 MG PO TABS
5.0000 mg | ORAL_TABLET | Freq: Four times a day (QID) | ORAL | Status: DC | PRN
  Administered 2014-07-24 – 2014-08-05 (×11): 5 mg via ORAL
  Filled 2014-07-24 (×12): qty 1

## 2014-07-24 MED ORDER — AMIODARONE HCL 200 MG PO TABS
200.0000 mg | ORAL_TABLET | Freq: Two times a day (BID) | ORAL | Status: DC
  Administered 2014-07-24 – 2014-08-07 (×30): 200 mg via ORAL
  Filled 2014-07-24 (×31): qty 1

## 2014-07-24 MED ORDER — CARVEDILOL 12.5 MG PO TABS
12.5000 mg | ORAL_TABLET | Freq: Three times a day (TID) | ORAL | Status: DC
  Administered 2014-07-24 – 2014-07-25 (×3): 12.5 mg via ORAL
  Filled 2014-07-24 (×5): qty 1

## 2014-07-24 NOTE — Progress Notes (Signed)
Patient ID: Thomas Wright, male   DOB: [birth year], 68 y.o.   MRN: [id number]  [facility] PROGRESS NOTE  Thomas Wright WIO:[id number] DOB: January 04, 1946 DOA: 07/21/2014 PCP: [name], MD  Brief narrative: 68/M with PMH of DM, HTN, Afib, h/o bioprosthetic valve replacement in 2008, h/o tenosynovitis and I&D in 8/15 presented to the ER with generalized malaise, chest pain, generalized pain for 4-5 days, found to have Afib with RVR and cards admitting him and [facility] consulted.  He had not been taking his oral hypoglycemics for months now, his CBGs have been running in 300s-400s for weeks .In addition he also felt poorly and had diarrhea and chills over the past weekend but no fevers. He saw Dr. Burney Gauze to FU his R finger problem on 11/2 and was scheduled to have his finger nail removed the next week and a skin graft placed.  Assessment and Plan:   Diabetic ketoacidosis - in the setting of poorly controlled DM secondary to medical non compliance, A1C > 11 - now resolved  - now on Lantus and will need to continue upon discharge - pt also on Glipizide  - while inpatient, will provide SSI with HS coverage - pre authorization for Lantus Flexpen completed 11/6/decision pending Atrial fibrillation with RVR - H/O aortic valve replacement with tissue graft normal gradient and no SBE on TEE yesterday - TEE with LAA thrombus Continue eliquis Plan [place] 3 weeks  Dehydration - secondary to DKA, resolved  - pt tolerating current diet well  Acute respiratory failure with hypoxia due to combine diastolic and systolic dysfunction EF 42-68% - EF in March 45-50% with grade 2 diastolic dysfunction - echo this admission demonstrated worsened EF now down to 25%  - appreciate cardio team following  Hyponatremia - mild, repeat BMP in AM Hypertension - stable this AM - continue Coreg 12.5 mg TID Leukocytosis - so far no clear source, no specific urinary concerns for pt and no signs of developing  PNA - pt is afebrile, so will just monitor for now Tenosynovitis of finger - Exam not consistent with infectious findings - will notify his hand surgeon  Patient non adherence - Discussed at length with patient  DVT prophylaxis: Eliquis   Code Status: Full Family Communication: Pt at bedside Disposition Plan: Home when medically stable  IV Access:   Peripheral IV Procedures and diagnostic studies:    Dg Chest Port 1 View  07/23/2014 Cardiomegaly and mild edema.     2-D echocardiogram: EF 20 - 25%. Medical Consultants:   Cardiology  Other Consultants:   Diabetic educator   Anti-Infectives:   None   Faye Ramsay, MD  [facility] Pager [phone]  If 7PM-7AM, please contact night-coverage [email or web address] Password [email or web address] 07/24/2014, 12:02 PM   LOS: 3 days   HPI/Subjective: No events overnight.   Objective: Filed Vitals:   07/24/14 0653 07/24/14 0700 07/24/14 0811 07/24/14 1152  BP:  146/114 101/82 113/69  Pulse:   95 59  Temp:   97.4 F (36.3 C) 98.3 F (36.8 C)  TempSrc:   Axillary Axillary  Resp:   24 29  Height:      Weight: 101.1 kg (222 lb 14.2 oz)     SpO2:   99% 98%    Intake/Output Summary (Last 24 hours) at 07/24/14 1202 Last data filed at 07/24/14 1155  Gross per 24 hour  Intake   33.4 ml  Output   1475 ml  Net -1441.6 ml    Exam:   General:  Pt is alert, follows commands appropriately, not in acute distress  Cardiovascular: Regular rhythm, bradycardic, no rubs, no gallops  Respiratory: Clear to auscultation bilaterally, no wheezing, no crackles, no rhonchi  Abdomen: Soft, non tender, non distended, bowel sounds present, no guarding  Data Reviewed: Basic Metabolic Panel:  Recent Labs Lab 07/21/14 1621  07/21/14 2248 07/22/14 0240 07/22/14 0419 07/23/14 0230 07/24/14 0350  NA 138  < > 136* 135* 136* 133* 131*  K 3.8  < > 3.6* 3.8 3.7 3.9 3.8  CL 100  < > 101 102 103 99 96  CO2 19  < > 24 19 18* 19 21  GLUCOSE 238*  < > 95 139*  196* 217* 235*  BUN 19  < > 22 20 19 10 8   CREATININE 0.70  < > 0.76 0.62 0.56 0.54 0.55  CALCIUM 9.1  < > 8.6 8.5 8.4 8.4 8.2*  MG 2.1  --   --   --   --   --   --   < > = values in this interval not displayed. Liver Function Tests:  Recent Labs Lab 07/23/14 0230  AST 14  ALT 10  ALKPHOS 219*  BILITOT 0.3  PROT 6.2  ALBUMIN 2.5*     Recent Labs Lab 07/21/14 0959 07/22/14 0240 07/23/14 0230 07/24/14 0350  WBC 18.8* 20.4* 20.6* 20.5*  NEUTROABS 15.1*  --   --   --   HGB 15.4 13.3 13.6 12.8*  HCT 44.4 39.0 39.7 37.5*  MCV 82.1 82.8 81.5 81.9  PLT 299 301 291 277   Cardiac Enzymes:  Recent Labs Lab 07/21/14 1012 07/21/14 1621 07/21/14 1917 07/22/14 0248  TROPONINI <0.30 <0.30 <0.30 <0.30   CBG:  Recent Labs Lab 07/23/14 1205 07/23/14 1606 07/23/14 2205 07/24/14 0816 07/24/14 1156  GLUCAP 219* 215* 375* 209* 237*    Recent Results (from the past 240 hour(s))  Culture, blood (routine x 2)     Status: None (Preliminary result)   Collection Time: 07/21/14  4:26 PM  Result Value Ref Range Status   Specimen Description BLOOD RIGHT HAND  Final   Special Requests BOTTLES DRAWN AEROBIC AND ANAEROBIC 5CC  Final   Culture  Setup Time   Final    07/21/2014 22:18 Performed at [facility]    Culture   Final           BLOOD CULTURE RECEIVED NO GROWTH TO DATE CULTURE WILL BE HELD FOR 5 DAYS BEFORE ISSUING A FINAL NEGATIVE REPORT Performed at [facility]    Report Status PENDING  Incomplete  Culture, blood (routine x 2)     Status: None (Preliminary result)   Collection Time: 07/21/14  4:32 PM  Result Value Ref Range Status   Specimen Description BLOOD LEFT ARM  Final   Special Requests BOTTLES DRAWN AEROBIC AND ANAEROBIC 10CC  Final   Culture  Setup Time   Final    07/21/2014 22:17 Performed at [facility]    Culture   Final           BLOOD CULTURE RECEIVED NO GROWTH TO DATE CULTURE WILL BE HELD FOR 5 DAYS BEFORE ISSUING A FINAL  NEGATIVE REPORT Performed at [facility]    Report Status PENDING  Incomplete  MRSA PCR Screening     Status: None   Collection Time: 07/21/14  6:41 PM  Result Value Ref Range Status   MRSA by PCR NEGATIVE NEGATIVE Final    Comment:  The GeneXpert MRSA Assay (FDA approved for NASAL specimens only), is one component of a comprehensive MRSA colonization surveillance program. It is not intended to diagnose MRSA infection nor to guide or monitor treatment for MRSA infections.      Scheduled Meds: . amiodarone  200 mg Oral BID  . apixaban  5 mg Oral BID  . aspirin EC  81 mg Oral Daily  . atorvastatin  10 mg Oral q morning - 10a  . carvedilol  12.5 mg Oral TID  . glipiZIDE  5 mg Oral QAC breakfast  . insulin aspart  0-5 Units Subcutaneous QHS  . insulin aspart  0-9 Units Subcutaneous TID WC  . insulin glargine  20 Units Subcutaneous QHS  . [MAR Hold] living well with diabetes book   Does not apply Once  . polyethylene glycol  17 g Oral Daily   Continuous Infusions: . sodium chloride 20 mL/hr at 07/23/14 1033

## 2014-07-24 NOTE — Progress Notes (Signed)
Patient ID: Thomas Wright, male   DOB: Sep 02, 1946, 68 y.o.   MRN: [id number]    Subjective: Less dyspnic    Objective: Vital signs in last 24 hours: Temp:  [97.4 F (36.3 C)-98.9 F (37.2 C)] 97.4 F (36.3 C) (11/07 0811) Pulse Rate:  [56-114] 95 (11/07 0811) Resp:  [17-30] 24 (11/07 0811) BP: (99-146)/(51-114) 101/82 mmHg (11/07 0811) SpO2:  [97 %-100 %] 99 % (11/07 0811) Weight:  [101.1 kg (222 lb 14.2 oz)] 101.1 kg (222 lb 14.2 oz) (11/07 0653) Last BM Date: 07/19/14  Intake/Output from previous day: 11/06 0701 - 11/07 0700 In: 196.9 [I.V.:196.9] Out: 800 [Urine:800] Intake/Output this shift: Total I/O In: -  Out: 500 [Urine:500]  Medications Current Facility-Administered Medications  Medication Dose Route Frequency Provider Last Rate Last Dose  . 0.9 %  sodium chloride infusion   Intravenous Continuous Samella Parr, NP 20 mL/hr at 07/23/14 1033    . acetaminophen (TYLENOL) tablet 650 mg  650 mg Oral Q4H PRN [name], MD      . amiodarone (NEXTERONE PREMIX) 360 MG/200ML (1.8 mg/mL) IV infusion  30 mg/hr Intravenous Continuous Isaiah Serge, NP 16.7 mL/hr at 07/24/14 0624 30 mg/hr at 07/24/14 0624  . apixaban (ELIQUIS) tablet 5 mg  5 mg Oral BID [name], MD   5 mg at 07/23/14 2216  . aspirin EC tablet 81 mg  81 mg Oral Daily [name], MD   81 mg at 07/23/14 1028  . atorvastatin (LIPITOR) tablet 10 mg  10 mg Oral q morning - 10a [name], MD   10 mg at 07/23/14 1028  . carvedilol (COREG) tablet 12.5 mg  12.5 mg Oral BID WC Josue Hector, MD   12.5 mg at 07/24/14 6269  . dextrose 50 % solution 25 mL  25 mL Intravenous PRN Domenic Polite, MD      . fentaNYL (SUBLIMAZE) injection 25-50 mcg  25-50 mcg Intravenous Q5 min PRN E. Annye Asa, MD      . glipiZIDE (GLUCOTROL) tablet 5 mg  5 mg Oral QAC breakfast Samella Parr, NP   5 mg at 07/24/14 4854  . Influenza vac split quadrivalent PF (FLUARIX) injection 0.5 mL  0.5 mL Intramuscular Prior to discharge  [name], MD      . insulin aspart (novoLOG) injection 0-5 Units  0-5 Units Subcutaneous QHS Samella Parr, NP   5 Units at 07/23/14 2217  . insulin aspart (novoLOG) injection 0-9 Units  0-9 Units Subcutaneous TID WC Samella Parr, NP   3 Units at 07/24/14 [phone]  . insulin glargine (LANTUS) injection 20 Units  20 Units Subcutaneous QHS Jeryl Columbia, NP   20 Units at 07/23/14 2217  . [MAR Hold] living well with diabetes book MISC   Does not apply Once [name], MD      . meperidine (DEMEROL) injection 6.25-12.5 mg  6.25-12.5 mg Intravenous Q5 min PRN E. Annye Asa, MD      . ondansetron [facility]) injection 4 mg  4 mg Intravenous Q6H PRN [name], MD      . polyethylene glycol (MIRALAX / GLYCOLAX) packet 17 g  17 g Oral Daily Sueanne Margarita, MD   17 g at 07/22/14 2252  . promethazine (PHENERGAN) injection 6.25-12.5 mg  6.25-12.5 mg Intravenous Q15 min PRN E. Annye Asa, MD        PE: Affect appropriate Chronically ill white male  HEENT: normal Neck supple with no adenopathy JVP  normal no bruits no thyromegaly Lungs clear with no wheezing and good diaphragmatic motion Heart:  S1/S2 SEM  murmur, no rub, gallop or click PMI normal Abdomen: benighn, BS positve, no tenderness, no AAA no bruit.  No HSM or HJR Distal pulses intact with no bruits No edema Neuro non-focal Dressing on right middle finger  No muscular weakness   Lab Results:   Recent Labs  07/22/14 0240 07/23/14 0230 07/24/14 0350  WBC 20.4* 20.6* 20.5*  HGB 13.3 13.6 12.8*  HCT 39.0 39.7 37.5*  PLT 301 291 277   BMET  Recent Labs  07/22/14 0419 07/23/14 0230 07/24/14 0350  NA 136* 133* 131*  K 3.7 3.9 3.8  CL 103 99 96  CO2 18* 19 21  GLUCOSE 196* 217* 235*  BUN 19 10 8   CREATININE 0.56 0.54 0.55  CALCIUM 8.4 8.4 8.2*    Assessment/Plan 68 year old male with paroxysmal atrial fibrillation, stopped amiodarone in 2013 after no further episodes, with prior episode of  postoperative atrial fibrillation following bioprosthetic aortic valve replacement in 2008 here with atypical chest discomfort and atrial fibrillation with rapid ventricular response, general malaise  Principal Problem:  Afib  Active Problems:    Cardiomyopathy     Appears euvolemic    H/O aortic valve replacement with tissue graft normal gradient and no SBE on TEE yesterday   Afib   TEE with LAA thrombus  Continue eliquis  Plan [facility] 3 weeks    Tenosynovitis of finger  Need to see if Dr Burney Gauze can see in house Monday Not likely ready for surgery [place]     LOS: 3 days    Jenkins Rouge

## 2014-07-25 ENCOUNTER — Inpatient Hospital Stay (HOSPITAL_COMMUNITY): Payer: Medicare HMO

## 2014-07-25 DIAGNOSIS — I213 ST elevation (STEMI) myocardial infarction of unspecified site: Secondary | ICD-10-CM

## 2014-07-25 DIAGNOSIS — I513 Intracardiac thrombosis, not elsewhere classified: Secondary | ICD-10-CM | POA: Insufficient documentation

## 2014-07-25 LAB — CBC
HEMATOCRIT: 34.2 % — AB (ref 39.0–52.0)
HEMOGLOBIN: 11.7 g/dL — AB (ref 13.0–17.0)
MCH: 27.8 pg (ref 26.0–34.0)
MCHC: 34.2 g/dL (ref 30.0–36.0)
MCV: 81.2 fL (ref 78.0–100.0)
Platelets: 285 10*3/uL (ref 150–400)
RBC: 4.21 MIL/uL — ABNORMAL LOW (ref 4.22–5.81)
RDW: 12.8 % (ref 11.5–15.5)
WBC: 19.4 10*3/uL — ABNORMAL HIGH (ref 4.0–10.5)

## 2014-07-25 LAB — BASIC METABOLIC PANEL
Anion gap: 10 (ref 5–15)
BUN: 7 mg/dL (ref 6–23)
CHLORIDE: 97 meq/L (ref 96–112)
CO2: 25 mEq/L (ref 19–32)
Calcium: 8.1 mg/dL — ABNORMAL LOW (ref 8.4–10.5)
Creatinine, Ser: 0.64 mg/dL (ref 0.50–1.35)
GFR calc Af Amer: >90 mL/min (ref >90)
GFR calc non Af Amer: >90 mL/min (ref >90)
GLUCOSE: 192 mg/dL — AB (ref 70–99)
POTASSIUM: 3.5 meq/L — AB (ref 3.7–5.3)
Sodium: 132 mEq/L — ABNORMAL LOW (ref 137–147)

## 2014-07-25 LAB — GLUCOSE, CAPILLARY
GLUCOSE-CAPILLARY: 218 mg/dL — AB (ref 70–99)
GLUCOSE-CAPILLARY: 188 mg/dL — AB (ref 70–99)
GLUCOSE-CAPILLARY: 265 mg/dL — AB (ref 70–99)
GLUCOSE-CAPILLARY: 271 mg/dL — AB (ref 70–99)
Glucose-Capillary: 252 mg/dL — ABNORMAL HIGH (ref 70–99)

## 2014-07-25 MED ORDER — POTASSIUM CHLORIDE CRYS ER 20 MEQ PO TBCR
40.0000 meq | EXTENDED_RELEASE_TABLET | Freq: Once | ORAL | Status: AC
Start: 2014-07-25 — End: 2014-07-25
  Administered 2014-07-25: 40 meq via ORAL
  Filled 2014-07-25 (×2): qty 2

## 2014-07-25 MED ORDER — BISACODYL 10 MG RE SUPP
10.0000 mg | Freq: Once | RECTAL | Status: AC
  Administered 2014-07-25: 10 mg via RECTAL
  Filled 2014-07-25: qty 1

## 2014-07-25 MED ORDER — SENNA 8.6 MG PO TABS
1.0000 | ORAL_TABLET | Freq: Once | ORAL | Status: AC
  Administered 2014-07-26: 8.6 mg via ORAL
  Filled 2014-07-25: qty 1

## 2014-07-25 MED ORDER — CARVEDILOL 25 MG PO TABS
25.0000 mg | ORAL_TABLET | Freq: Two times a day (BID) | ORAL | Status: DC
  Administered 2014-07-25 – 2014-07-28 (×7): 25 mg via ORAL
  Filled 2014-07-25 (×8): qty 1

## 2014-07-25 MED ORDER — CARVEDILOL 12.5 MG PO TABS
12.5000 mg | ORAL_TABLET | ORAL | Status: AC
  Administered 2014-07-25: 12.5 mg via ORAL
  Filled 2014-07-25: qty 1

## 2014-07-25 NOTE — Plan of Care (Signed)
Problem: Phase I Progression Outcomes Goal: Hemodynamically stable Outcome: Progressing     

## 2014-07-25 NOTE — Progress Notes (Signed)
Patient ID: EMARI DEMMER, male   DOB: 12/15/1945, 68 y.o.   MRN: [id number]      Subjective:  Slightly improved malaise  And SOB. He still doesn't feel himself.  Objective: Vital signs in last 24 hours: Temp:  [97.1 F (36.2 C)-99.2 F (37.3 C)] 97.1 F (36.2 C) (11/08 0812) Pulse Rate:  [59-133] 114 (11/08 0812) Resp:  [18-33] 29 (11/08 0812) BP: (94-125)/(50-81) 121/69 mmHg (11/08 0812) SpO2:  [91 %-100 %] 98 % (11/08 0812) Weight:  [224 lb 10.4 oz (101.9 kg)] 224 lb 10.4 oz (101.9 kg) (11/08 0426) Last BM Date: 07/24/14  Intake/Output from previous day: 11/07 0701 - 11/08 0700 In: 600 [P.O.:600] Out: 2100 [Urine:2100] Intake/Output this shift: Total I/O In: -  Out: 875 [Urine:875]  Medications . amiodarone  200 mg Oral BID  . apixaban  5 mg Oral BID  . aspirin EC  81 mg Oral Daily  . atorvastatin  10 mg Oral q morning - 10a  . carvedilol  12.5 mg Oral TID  . glipiZIDE  5 mg Oral QAC breakfast  . insulin aspart  0-5 Units Subcutaneous QHS  . insulin aspart  0-9 Units Subcutaneous TID WC  . insulin glargine  20 Units Subcutaneous QHS  . [MAR Hold] living well with diabetes book   Does not apply Once  . polyethylene glycol  17 g Oral Daily   . sodium chloride 20 mL/hr at 07/23/14 1033   PE: Affect appropriate Chronically ill white male  HEENT: normal Neck supple with no adenopathy JVP normal no bruits no thyromegaly Lungs clear with no wheezing and good diaphragmatic motion Heart:  S1/S2 SEM  murmur, no rub, gallop or click PMI normal Abdomen: benighn, BS positve, no tenderness, no AAA no bruit.  No HSM or HJR Distal pulses intact with no bruits No edema Neuro non-focal Dressing on right middle finger  No muscular weakness   Lab Results:   Recent Labs  07/23/14 0230 07/24/14 0350 07/25/14 0325  WBC 20.6* 20.5* 19.4*  HGB 13.6 12.8* 11.7*  HCT 39.7 37.5* 34.2*  PLT 291 277 285   BMET  Recent Labs  07/23/14 0230 07/24/14 0350 07/25/14 0325    NA 133* 131* 132*  K 3.9 3.8 3.5*  CL 99 96 97  CO2 19 21 25   GLUCOSE 217* 235* 192*  BUN 10 8 7   CREATININE 0.54 0.55 0.64  CALCIUM 8.4 8.2* 8.1*    Assessment/Plan  68 year old male with paroxysmal atrial fibrillation, stopped amiodarone in 2013 after no further episodes, with prior episode of postoperative atrial fibrillation following bioprosthetic aortic valve replacement in 2008 here with atypical chest discomfort, fatigue and was found to be in atrial fibrillation with RVR and in diabetic ketoacidosis.  1. Parox a-fib with RVR - we will increase carvedilol to 25 mg po bid   TEE with LAA thrombus  Continue eliquis  Plan [place] 3 weeks   2. H/O aortic valve replacement with tissue graft normal gradient and no SBE on TEE on 07/23/2014   3. Cardiomyopathy     Appears euvolemic   4. Diabetic ketoacidosis - resolved, managed by hospitalist service, started on Lantus, will need to be discharged on it    5. Tenosynovitis of finger  Need to see if Dr Burney Gauze can see in house Monday Not likely ready for surgery [place]   LOS: 4 days    Dorothy Spark

## 2014-07-25 NOTE — Progress Notes (Signed)
PT Cancellation Note  Patient Details Name: Thomas Wright MRN: [id number] DOB: 06-04-1946   Cancelled Treatment:    Reason Eval/Treat Not Completed: Medical issues which prohibited therapy.  Note pt with afib w/RVR, unable to undergo cardioversion at this time, increasing pharmacological interventions to manage.  Note: 68 yo has max predicated HR of 154; patient readings in 100-110's this date = 65-75% max predicted HR.   Will defer mobility assessment until more medically stable, checking back next date for appropriateness.  Thank you,   Herbie Drape 07/25/2014, 11:38 AM

## 2014-07-25 NOTE — Plan of Care (Signed)
Problem: Phase I Progression Outcomes Goal: Pain controlled with appropriate interventions Outcome: Progressing     

## 2014-07-25 NOTE — Progress Notes (Addendum)
Patient ID: Thomas Wright, male   DOB: October Wright, Thomas Wright, 68 y.o.   MRN: [id number] [facility] PROGRESS NOTE  Thomas Wright PPI:[id number] DOB: [birth year] Wright: 07/21/2014 PCP: Thomas Heck, MD  Brief narrative: 68 year old male with PMH of DM, HTN, Afib, h/o bioprosthetic valve replacement in 2008, h/o tenosynovitis and I&D in 8/15 presented to the ER with generalized malaise, chest pain  for 4-5 days PTA. Further, patient had not been taking his oral hypoglycemics for months, his CBGs have been running in 300s-400s for weeks . Of note, he saw Thomas Wright for R finger problem on 11/2 and was scheduled to have his finger nail removed the following week with a skin graft placement Patient was admitted for DKA. His hospital course is complicated with finding of LAA thrombus on TEE. He is on anticoagulation with apixaban.   Assessment/Plan:    Diabetic ketoacidosis / Diabetes mellitus, uncontrolled, no complications  - in the setting of poorly controlled DM secondary to medical non compliance, A1C > 11. Patient was admitted to SDU for management. - DKA resolved.  - now on Lantus 20 units at bedtime and will need to continue upon discharge. Also on glipizide 5 mg daily. - while inpatient, will provide SSI with HS coverage - pre authorization for Lantus Flexpen completed 11/6/decision pending Atrial fibrillation with RVR - H/O aortic valve replacement with tissue graft normal gradient and no SBE on TEE  - TEE with LAA thrombus Continue eliquis. Plan Thomas Wright 3 weeks  - continue amiodarone 200 mg PO BID - continue coreg, increased to 25 mg PO BID Dehydration - secondary to DKA, resolved. - pt tolerating current diet well  Acute respiratory failure with hypoxia due to combine diastolic and systolic dysfunction EF 06-30% - EF in March 45-50% with grade 2 diastolic dysfunction - echo this admission demonstrated worsened EF now down to 25%  - appreciate cardio team following  - continue  aspirin and statin therapy  Hyponatremia - mild, stable and ranges 131-133 Hypertension - stable this AM - continue Coreg 12.5 mg TID Hypokalemia - likely in the setting of insulin - supplemented - follow up BMP in am  Leukocytosis - so far no clear source, no specific urinary concerns for pt and no signs of developing PNA; stress demargination in the setting of DKA, atrial fibrillation  - pt is afebrile, so will continue to monitor for now Tenosynovitis of finger - Exam not consistent with infectious findings Patient non adherence - Discussed about the importance of compliance with the patient   DVT prophylaxis:   On anticoagulation with apixaban   Code Status: Full.  Family Communication:  plan of care discussed with the patient Disposition Plan: remains in SDU; still tachycardic   IV access:   PeripheralIV  Procedures and diagnostic studies:     Dg Chest Port 1 View 07/23/2014 Cardiomegaly and mild edema.   2-D echocardiogram 07/22/2014: EF 20 - 25%.  TEE 07/23/2014 - Enlarged LAA with calcified thrombus; normal RAA; EF 25-30%  Medical Consultants:   Cardiology   Other Consultants:   Diabetic coordinator   IAnti-Infectives:    None  Thomas Lenz, MD  [facility] Pager [phone]  If 7PM-7AM, please contact night-coverage [email or web address] Password [email or web address] 07/25/2014, 11:10 AM   LOS: 4 days    HPI/Subjective: No acute overnight events.  Objective: Filed Vitals:   07/24/14 1957 07/25/14 0114 07/25/14 0426 07/25/14 0812  BP: 94/Thomas 104/67 111/50 121/69  Pulse: 112 118 103 114  Temp: 98.9  F (37.2 C) 98.9 F (37.2 C) 98.5 F (36.9 C) 97.1 F (36.2 C)  TempSrc: Oral Axillary Oral Axillary  Resp: 22 26 20 29   Height:      Weight:   101.9 kg (224 lb 10.4 oz)   SpO2: 94% 97% 100% 98%    Intake/Output Summary (Last 24 hours) at 07/25/14 1110 Last data filed at 07/25/14 0929  Gross per 24 hour  Intake   1080 ml  Output   2475 ml  Net   -1395 ml    Exam:   General:  Pt is alert, follows commands appropriately, not in acute distress  Cardiovascular: irregular rhythm, tachycardic, S1/S2 appreciated   Respiratory: Clear to auscultation bilaterally, no wheezing, no crackles, no rhonchi  Abdomen: Soft, non tender, non distended, bowel sounds present  Extremities: pulses DP and PT palpable bilaterally  Neuro: Grossly nonfocal  Data Reviewed: Basic Metabolic Panel:  Recent Labs Lab 07/21/14 1621  07/22/14 0240 07/22/14 0419 07/23/14 0230 07/24/14 0350 07/25/14 0325  NA 138  < > 135* 136* 133* 131* 132*  K 3.8  < > 3.8 3.7 3.9 3.8 3.5*  CL 100  < > 102 103 99 96 97  CO2 19  < > 19 18* 19 21 25   GLUCOSE 238*  < > 139* 196* 217* 235* 192*  BUN 19  < > 20 19 10 8 7   CREATININE 0.70  < > 0.62 0.56 0.54 0.55 0.64  CALCIUM 9.1  < > 8.5 8.4 8.4 8.2* 8.1*  MG 2.1  --   --   --   --   --   --   < > = values in this interval not displayed. Liver Function Tests:  Recent Labs Lab 07/23/14 0230  AST 14  ALT 10  ALKPHOS 219*  BILITOT 0.3  PROT 6.2  ALBUMIN 2.5*   No results for input(s): LIPASE, AMYLASE in the last 168 hours. No results for input(s): AMMONIA in the last 168 hours. CBC:  Recent Labs Lab 07/21/14 0959 07/22/14 0240 07/23/14 0230 07/24/14 0350 07/25/14 0325  WBC 18.8* 20.4* 20.6* 20.5* 19.4*  NEUTROABS 15.1*  --   --   --   --   HGB 15.4 13.3 13.6 12.8* 11.7*  HCT 44.4 39.0 39.7 37.5* 34.2*  MCV 82.1 82.8 81.5 81.9 81.2  PLT 299 301 291 277 285   Cardiac Enzymes:  Recent Labs Lab 07/21/14 1012 07/21/14 1621 07/21/14 1917 07/22/14 0248  TROPONINI <0.30 <0.30 <0.30 <0.30   BNP: Invalid input(s): POCBNP CBG:  Recent Labs Lab 07/24/14 0816 07/24/14 1156 07/24/14 1613 07/25/14 0110 07/25/14 0814  GLUCAP 209* 237* 292* 218* 188*    Culture, blood (routine x 2)     Status: None (Preliminary result)   Collection Time: 07/21/14  4:26 PM  Result Value Ref Range Status    Specimen Description BLOOD RIGHT HAND  Final   Culture  Setup Time   Final           BLOOD CULTURE RECEIVED NO GROWTH TO DATE    Report Status PENDING  Incomplete  Culture, blood (routine x 2)     Status: None (Preliminary result)   Collection Time: 07/21/14  4:32 PM  Result Value Ref Range Status   Specimen Description BLOOD LEFT ARM  Final   Culture  Setup Time   Final   Culture   Final           BLOOD CULTURE RECEIVED NO GROWTH TO DATE  Report Status PENDING  Incomplete  MRSA PCR Screening     Status: None   Collection Time: 07/21/14  6:41 PM  Result Value Ref Range Status   MRSA by PCR NEGATIVE NEGATIVE Final    Comment:           Scheduled Meds: . amiodarone  200 mg Oral BID  . apixaban  5 mg Oral BID  . aspirin EC  81 mg Oral Daily  . atorvastatin  10 mg Oral q morning - 10a  . carvedilol  12.5 mg Oral NOW  . carvedilol  25 mg Oral BID WC  . glipiZIDE  5 mg Oral QAC breakfast  . insulin aspart  0-5 Units Subcutaneous QHS  . insulin aspart  0-9 Units Subcutaneous TID WC  . insulin glargine  20 Units Subcutaneous QHS  . polyethylene glycol  17 g Oral Daily  . potassium chloride  40 mEq Oral Once   Continuous Infusions: . sodium chloride 20 mL/hr at 07/23/14 1033

## 2014-07-26 ENCOUNTER — Encounter (HOSPITAL_COMMUNITY): Payer: Self-pay | Admitting: Cardiovascular Disease

## 2014-07-26 DIAGNOSIS — E1165 Type 2 diabetes mellitus with hyperglycemia: Secondary | ICD-10-CM

## 2014-07-26 LAB — CBC
HEMATOCRIT: 35.7 % — AB (ref 39.0–52.0)
Hemoglobin: 12.2 g/dL — ABNORMAL LOW (ref 13.0–17.0)
MCH: 27.7 pg (ref 26.0–34.0)
MCHC: 34.2 g/dL (ref 30.0–36.0)
MCV: 81.1 fL (ref 78.0–100.0)
PLATELETS: 304 10*3/uL (ref 150–400)
RBC: 4.4 MIL/uL (ref 4.22–5.81)
RDW: 12.7 % (ref 11.5–15.5)
WBC: 18.5 10*3/uL — AB (ref 4.0–10.5)

## 2014-07-26 LAB — BASIC METABOLIC PANEL
Anion gap: 12 (ref 5–15)
BUN: 8 mg/dL (ref 6–23)
CO2: 23 mEq/L (ref 19–32)
Calcium: 8.3 mg/dL — ABNORMAL LOW (ref 8.4–10.5)
Chloride: 97 mEq/L (ref 96–112)
Creatinine, Ser: 0.52 mg/dL (ref 0.50–1.35)
GFR calc non Af Amer: >90 mL/min (ref >90)
Glucose, Bld: 204 mg/dL — ABNORMAL HIGH (ref 70–99)
POTASSIUM: 4.3 meq/L (ref 3.7–5.3)
Sodium: 132 mEq/L — ABNORMAL LOW (ref 137–147)

## 2014-07-26 LAB — GLUCOSE, CAPILLARY
GLUCOSE-CAPILLARY: 178 mg/dL — AB (ref 70–99)
GLUCOSE-CAPILLARY: 285 mg/dL — AB (ref 70–99)
Glucose-Capillary: 283 mg/dL — ABNORMAL HIGH (ref 70–99)
Glucose-Capillary: 337 mg/dL — ABNORMAL HIGH (ref 70–99)

## 2014-07-26 MED ORDER — HYDROCORTISONE ACETATE 25 MG RE SUPP
25.0000 mg | Freq: Two times a day (BID) | RECTAL | Status: DC
  Administered 2014-07-26 – 2014-07-30 (×9): 25 mg via RECTAL
  Filled 2014-07-26 (×21): qty 1

## 2014-07-26 MED ORDER — CETYLPYRIDINIUM CHLORIDE 0.05 % MT LIQD
7.0000 mL | Freq: Two times a day (BID) | OROMUCOSAL | Status: DC
  Administered 2014-07-27 – 2014-08-07 (×21): 7 mL via OROMUCOSAL

## 2014-07-26 MED ORDER — INSULIN GLARGINE 100 UNIT/ML ~~LOC~~ SOLN
28.0000 [IU] | Freq: Every day | SUBCUTANEOUS | Status: DC
  Administered 2014-07-26: 28 [IU] via SUBCUTANEOUS
  Filled 2014-07-26 (×2): qty 0.28

## 2014-07-26 MED ORDER — INSULIN GLARGINE 100 UNIT/ML ~~LOC~~ SOLN
24.0000 [IU] | Freq: Every day | SUBCUTANEOUS | Status: DC
Start: 2014-07-26 — End: 2014-07-26
  Filled 2014-07-26: qty 0.24

## 2014-07-26 MED ORDER — METFORMIN HCL 500 MG PO TABS
1000.0000 mg | ORAL_TABLET | Freq: Two times a day (BID) | ORAL | Status: DC
  Filled 2014-07-26 (×2): qty 2

## 2014-07-26 MED ORDER — INSULIN ASPART 100 UNIT/ML ~~LOC~~ SOLN
4.0000 [IU] | Freq: Three times a day (TID) | SUBCUTANEOUS | Status: DC
  Administered 2014-07-26 – 2014-07-27 (×3): 4 [IU] via SUBCUTANEOUS

## 2014-07-26 NOTE — Progress Notes (Signed)
Patient ID: ZYLER HYSON, male   DOB: 10/05/1945, 68 y.o.   MRN: [id number] Patient ID: SHMUEL GIRGIS, male   DOB: February 27, 1946, 68 y.o.   MRN: [id number]      Subjective:  Slightly improved malaise  And SOB. He still doesn't feel himself.  Objective: Vital signs in last 24 hours: Temp:  [97.1 F (36.2 C)-99.4 F (37.4 C)] 98 F (36.7 C) (11/09 0418) Pulse Rate:  [44-116] 107 (11/09 0418) Resp:  [16-36] 20 (11/09 0418) BP: (103-125)/(54-82) 103/67 mmHg (11/09 0418) SpO2:  [98 %-100 %] 99 % (11/09 0418) Weight:  [101.3 kg (223 lb 5.2 oz)] 101.3 kg (223 lb 5.2 oz) (11/09 0418) Last BM Date: 07/26/14  Intake/Output from previous day: 11/08 0701 - 11/09 0700 In: 600 [P.O.:600] Out: 2176 [Urine:2175; Stool:1] Intake/Output this shift:    Medications . amiodarone  200 mg Oral BID  . apixaban  5 mg Oral BID  . aspirin EC  81 mg Oral Daily  . atorvastatin  10 mg Oral q morning - 10a  . carvedilol  25 mg Oral BID WC  . glipiZIDE  5 mg Oral QAC breakfast  . insulin aspart  0-5 Units Subcutaneous QHS  . insulin aspart  0-9 Units Subcutaneous TID WC  . insulin glargine  20 Units Subcutaneous QHS  . [MAR Hold] living well with diabetes book   Does not apply Once  . polyethylene glycol  17 g Oral Daily   . sodium chloride 20 mL/hr at 07/23/14 1033   PE: Affect appropriate Chronically ill white male  HEENT: normal Neck supple with no adenopathy JVP normal no bruits no thyromegaly Lungs clear with no wheezing and good diaphragmatic motion Heart:  S1/S2 SEM  murmur, no rub, gallop or click PMI normal Abdomen: benighn, BS positve, no tenderness, no AAA no bruit.  No HSM or HJR Distal pulses intact with no bruits No edema Neuro non-focal Dressing on right middle finger  No muscular weakness   Lab Results:   Recent Labs  07/24/14 0350 07/25/14 0325 07/26/14 0335  WBC 20.5* 19.4* 18.5*  HGB 12.8* 11.7* 12.2*  HCT 37.5* 34.2* 35.7*  PLT 277 285 304   BMET  Recent Labs  07/24/14 0350 07/25/14 0325 07/26/14 0335  NA 131* 132* 132*  K 3.8 3.5* 4.3  CL 96 97 97  CO2 21 25 23   GLUCOSE 235* 192* 204*  BUN 8 7 8   CREATININE 0.55 0.64 0.52  CALCIUM 8.2* 8.1* 8.3*    Assessment/Plan  68 year old male with paroxysmal atrial fibrillation, stopped amiodarone in 2013 after no further episodes, with prior episode of postoperative atrial fibrillation following bioprosthetic aortic valve replacement in 2008 here with atypical chest discomfort, fatigue and was found to be in atrial fibrillation with RVR and in diabetic ketoacidosis.  1. Parox a-fib with RVR - we will increase carvedilol to 25 mg po bid   TEE with LAA thrombus  Continue eliquis  Plan [place] 3 weeks   2. H/O aortic valve replacement with tissue graft normal gradient and no SBE on TEE on 07/23/2014   3. Cardiomyopathy     Appears euvolemic   4. Diabetic ketoacidosis - resolved, managed by hospitalist service, started on Lantus, will need to be discharged on it    5. Tenosynovitis of finger  Need to see if Dr Burney Gauze can see in house Monday Not likely ready for surgery [place]  With LAA thrombu Not ideal to stop anticoagulation and better control of DM will  help wound healing  However he will not be able to have surgery for 6-8 weeks If done after [facility]   LOS: 5 days    Jenkins Rouge

## 2014-07-26 NOTE — Progress Notes (Addendum)
[facility]  Thomas Wright KWI:[id number] DOB: 1946/08/14 DOA: 07/21/2014 PCP: [name], MD   Brief narrative: 68 yo M with PMH of DM, HTN, Afib, h/o bioprosthetic valve replacement in 2008, h/o tenosynovitis and I&D in 8/15 who presented to the ER with generalized malaise, and chest pain, and was found to have Afib with RVR.  Cards admitting him.    He had not been taking his oral hypoglycemics for months now - his CBGs had been running in 300s-400s for weeks.  His CBGs have become much better controlled while hospitalized with the use of long-acting insulin. Since plans are to discharge home on insulin Glucotrol was discontinued on 11/9 in favor of metformin. Pre authorization was completed for Lantus Solostar flexpen and patient was approved; email printed and placed on shadow chart.   HPI/Subjective: Alert and sitting up in chair-primarily complaining of issues related to hemorrhoids  Assessment/Plan:    Diabetic ketoacidosis / diabetes mellitus Hemoglobin A1c > 11 c/w poorly controlled diabetes -now on Lantus-will need insulin at discharge-diabetes educator consulted-pre authorization for Lantus Flexpen completed 11/6 and email response confirming approval printed and placed in shadow chart-add meal coverage as CBGs remain elevated in controlled inpt setting (and will likely be higher after d/c) - already on ASA and lipitor - needs ACE as soon as BP tolerates (currently marginally low at times)    Atrial fibrillation with RVR Per primary team cardiology    Dehydration Resolved- Due to DKA-decreased IVFs to kvo 11/6 since FU CXR with mild edema     Acute respiratory failure with hypoxia due to progressive systolic dysfunction EF 24-26% EF in March 45-50% with associated grade 2 diastolic dysfunction-echo this admission demonstrated worsened EF now down to 25% likely mediated by persistent tachycardia-cont oxygen-euvolemic off  Lasix   Hemorrhoids pt has had in past- with painful defecation- add Anusol HCL BID-cont stool softeners and lax    Hypertension Blood pressure soft in setting of tachycardia and volume depletion (on Carvedilol and Cozaar at home)    H/O aortic valve replacement with tissue graft    Tenosynovitis of finger primary team to ask Dr. Burney Gauze to see BEFORE DCCV since can't stop anti coag for 6-8 weeks after that is done    Patient non adherence Discussed at length with patient 11/5-okay with plans to transition to insulin at discharge along with metformin-agrees diabetes education consult would be beneficial  DVT prophylaxis: eliquis Code Status: full  Antibiotics: none  Objective: Blood pressure 118/60, pulse 102, temperature 99 F (37.2 C), temperature source Oral, resp. rate 25, height 6' (1.829 m), weight 101.3 kg (223 lb 5.2 oz), SpO2 99 %.  Intake/Output Summary (Last 24 hours) at 07/26/14 1640 Last data filed at 07/26/14 1100  Gross per 24 hour  Intake    120 ml  Output   1176 ml  Net  -1056 ml    Exam: Gen: No acute respiratory distress Chest: Fine bibasilar crackles, no wheeze Cardiac: Irregular, slightly tachycardic rate and rhythm, S1-S2, no rubs murmurs or gallops, no peripheral edema Abdomen: Soft nontender nondistended without obvious hepatosplenomegaly, no ascites Extremities: Symmetrical in appearance without cyanosis, clubbing or effusion  Scheduled Meds:  Scheduled Meds: . amiodarone  200 mg Oral BID  . apixaban  5 mg Oral BID  . aspirin EC  81 mg Oral Daily  . atorvastatin  10 mg Oral q morning - 10a  . carvedilol  25 mg Oral  BID WC  . hydrocortisone  25 mg Rectal BID  . insulin aspart  0-5 Units Subcutaneous QHS  . insulin aspart  0-9 Units Subcutaneous TID WC  . insulin glargine  24 Units Subcutaneous QHS  . [MAR Hold] living well with diabetes book   Does not apply Once  . metFORMIN  1,000 mg Oral BID WC  . polyethylene glycol  17 g Oral Daily     Data Reviewed: Basic Metabolic Panel:  Recent Labs Lab 07/21/14 1621  07/22/14 0419 07/23/14 0230 07/24/14 0350 07/25/14 0325 07/26/14 0335  NA 138  < > 136* 133* 131* 132* 132*  K 3.8  < > 3.7 3.9 3.8 3.5* 4.3  CL 100  < > 103 99 96 97 97  CO2 19  < > 18* 19 21 25 23   GLUCOSE 238*  < > 196* 217* 235* 192* 204*  BUN 19  < > 19 10 8 7 8   CREATININE 0.70  < > 0.56 0.54 0.55 0.64 0.52  CALCIUM 9.1  < > 8.4 8.4 8.2* 8.1* 8.3*  MG 2.1  --   --   --   --   --   --   < > = values in this interval not displayed.   Liver Function Tests:  Recent Labs Lab 07/23/14 0230  AST 14  ALT 10  ALKPHOS 219*  BILITOT 0.3  PROT 6.2  ALBUMIN 2.5*   CBC:  Recent Labs Lab 07/21/14 0959 07/22/14 0240 07/23/14 0230 07/24/14 0350 07/25/14 0325 07/26/14 0335  WBC 18.8* 20.4* 20.6* 20.5* 19.4* 18.5*  NEUTROABS 15.1*  --   --   --   --   --   HGB 15.4 13.3 13.6 12.8* 11.7* 12.2*  HCT 44.4 39.0 39.7 37.5* 34.2* 35.7*  MCV 82.1 82.8 81.5 81.9 81.2 81.1  PLT 299 301 291 277 285 304   Cardiac Enzymes:  Recent Labs Lab 07/21/14 1012 07/21/14 1621 07/21/14 1917 07/22/14 0248  TROPONINI <0.30 <0.30 <0.30 <0.30   CBG:  Recent Labs Lab 07/25/14 1253 07/25/14 1616 07/25/14 2125 07/26/14 0846 07/26/14 1152  GLUCAP 265* 271* 252* 178* 283*    Recent Results (from the past 240 hour(s))  Culture, blood (routine x 2)     Status: None (Preliminary result)   Collection Time: 07/21/14  4:26 PM  Result Value Ref Range Status   Specimen Description BLOOD RIGHT HAND  Final   Special Requests BOTTLES DRAWN AEROBIC AND ANAEROBIC 5CC  Final   Culture  Setup Time   Final    07/21/2014 22:18 Performed at [facility]    Culture   Final           BLOOD CULTURE RECEIVED NO GROWTH TO DATE CULTURE WILL BE HELD FOR 5 DAYS BEFORE ISSUING A FINAL NEGATIVE REPORT Performed at [facility]    Report Status PENDING  Incomplete  Culture, blood (routine x 2)     Status: None  (Preliminary result)   Collection Time: 07/21/14  4:32 PM  Result Value Ref Range Status   Specimen Description BLOOD LEFT ARM  Final   Special Requests BOTTLES DRAWN AEROBIC AND ANAEROBIC 10CC  Final   Culture  Setup Time   Final    07/21/2014 22:17 Performed at [facility]    Culture   Final           BLOOD CULTURE RECEIVED NO GROWTH TO DATE CULTURE WILL BE HELD FOR 5 DAYS BEFORE ISSUING A FINAL  NEGATIVE REPORT Performed at [facility]    Report Status PENDING  Incomplete  MRSA PCR Screening     Status: None   Collection Time: 07/21/14  6:41 PM  Result Value Ref Range Status   MRSA by PCR NEGATIVE NEGATIVE Final    Comment:        The GeneXpert MRSA Assay (FDA approved for NASAL specimens only), is one component of a comprehensive MRSA colonization surveillance program. It is not intended to diagnose MRSA infection nor to guide or monitor treatment for MRSA infections.      Studies:  Recent x-ray studies have been reviewed in detail by the Attending Physician  Time spent :  [place], ANP [facility] Office  [phone] Pager [phone]  On-Call/Text Page:      [email or web address]      password [email or web address]  If 7PM-7AM, please contact night-coverage [email or web address] Password [email or web address] 07/26/2014, 4:40 PM   LOS: 5 days   I have personally examined this patient and reviewed the entire database. I have reviewed the above note, made any necessary editorial changes, and agree with its content.  Cherene Altes, MD [facility]

## 2014-07-26 NOTE — Progress Notes (Signed)
Inpatient Diabetes Program Recommendations  AACE/ADA: New Consensus Statement on Inpatient Glycemic Control (2013)  Target Ranges:  Prepandial:   less than 140 mg/dL      Peak postprandial:   less than 180 mg/dL (1-2 hours)      Critically ill patients:  140 - 180 mg/dL   Reason for Assessment:  Results for Thomas Wright, Thomas Wright (MRN [id number]) as of 07/26/2014 10:12  Ref. Range 07/25/2014 08:14 07/25/2014 12:53 07/25/2014 16:16 07/25/2014 21:25 07/26/2014 08:46  Glucose-Capillary Latest Range: 70-99 mg/dL 188 (H) 265 (H) 271 (H) 252 (H) 178 (H)   Diabetes history:  Type 2 diabetes/DKA  Current orders for Inpatient glycemic control:  Note that Lantus increased today to 24 units q HS.  Metformin also added.  May consider adding Novolog meal coverage 5 units tid with meals to cover CHO intake.  Thanks, Adah Perl, RN, BC-ADM Inpatient Diabetes Coordinator Pager [phone]

## 2014-07-26 NOTE — Evaluation (Signed)
Physical Therapy Evaluation Patient Details Name: Thomas Wright MRN: [id number] DOB: 1945/10/29 Today's Date: 07/26/2014   History of Present Illness  68 year old male with paroxysmal atrial fibrillation, stopped amiodarone in 2013 after no further episodes, with prior episode of postoperative atrial fibrillation following bioprosthetic aortic valve replacement in 2008 here with atypical chest discomfort, fatigue and was found to be in atrial fibrillation with RVR and in diabetic ketoacidosis  Clinical Impression  Patient demonstrates deficits in functional mobility as indicated below. Will need continued skilled PT to address deficits and maximize function. Will see as indicated and progress as tolerated. Patient may benefit from HHPT upon acute discharge, however, if patient progresses well may not need this, will monitor progress.    Follow Up Recommendations Home health PT;Supervision/Assistance - 24 hour (pending progress)    Equipment Recommendations  Rolling walker with 5" wheels    Recommendations for Other Services       Precautions / Restrictions Precautions Precautions: Fall      Mobility  Bed Mobility Overal bed mobility: Needs Assistance Bed Mobility: Rolling;Supine to Sit Rolling: Min guard   Supine to sit: Min assist     General bed mobility comments: assist to elevate to upright position  Transfers Overall transfer level: Needs assistance Equipment used: Rolling walker (2 wheeled) Transfers: Sit to/from Stand Sit to Stand: Min assist         General transfer comment: min assist for elevation to upright with elevated bed surface and VCs to come to EOB and cues for safe hand placement with use of RW  Ambulation/Gait Ambulation/Gait assistance: Min assist Ambulation Distance (Feet): 18 Feet Assistive device: Rolling walker (2 wheeled) Gait Pattern/deviations: Step-to pattern;Decreased stride length;Trunk flexed;Drifts right/left Gait velocity:  decreased Gait velocity interpretation: Below normal speed for age/gender General Gait Details: some instability noted with ambulation, VCs for upright posture.  + dizziness during activity, HR elevated to 130s with mobility  Stairs            Wheelchair Mobility    Modified Rankin (Stroke Patients Only)       Balance                                             Pertinent Vitals/Pain Pain Assessment: 0-10 Pain Score: 5  Pain Location: upper abdominal and chest Pain Descriptors / Indicators: Discomfort;Sore Pain Intervention(s): Limited activity within patient's tolerance;Monitored during session;Relaxation;Repositioned    Home Living Family/patient expects to be discharged to:: Private residence Living Arrangements: Spouse/significant other Available Help at Discharge: Family Type of Home: Mobile home Home Access: Stairs to enter Entrance Stairs-Rails: Right Entrance Stairs-Number of Steps: 3 Home Layout: One level Home Equipment: None      Prior Function Level of Independence: Independent               Hand Dominance   Dominant Hand: Right    Extremity/Trunk Assessment   Upper Extremity Assessment: Generalized weakness           Lower Extremity Assessment: Generalized weakness         Communication   Communication: No difficulties  Cognition Arousal/Alertness: Awake/alert Behavior During Therapy: WFL for tasks assessed/performed Overall Cognitive Status: Within Functional Limits for tasks assessed                      General Comments  Exercises        Assessment/Plan    PT Assessment Patient needs continued PT services  PT Diagnosis Difficulty walking;Abnormality of gait;Generalized weakness;Acute pain   PT Problem List Decreased strength;Decreased range of motion;Decreased activity tolerance;Decreased balance;Decreased mobility;Decreased knowledge of use of DME;Cardiopulmonary status limiting  activity  PT Treatment Interventions DME instruction;Gait training;Stair training;Functional mobility training;Therapeutic activities;Therapeutic exercise;Balance training;Patient/family education   PT Goals (Current goals can be found in the Care Plan section) Acute Rehab PT Goals Patient Stated Goal: to go home  PT Goal Formulation: With patient Time For Goal Achievement: 08/09/14 Potential to Achieve Goals: Good    Frequency Min 3X/week   Barriers to discharge        Co-evaluation               End of Session Equipment Utilized During Treatment: Gait belt Activity Tolerance: Patient tolerated treatment well;Patient limited by fatigue Patient left: in chair;with call bell/phone within reach Nurse Communication: Mobility status         Time: 1572-6203 PT Time Calculation (min): 20 min   Charges:   PT Evaluation $Initial PT Evaluation Tier I: 1 Procedure PT Treatments $Gait Training: 8-22 mins   PT G CodesDuncan Dull 07/26/2014, 9:00 AM  [name], [place] DPT  [phone]

## 2014-07-27 DIAGNOSIS — I5023 Acute on chronic systolic (congestive) heart failure: Secondary | ICD-10-CM | POA: Insufficient documentation

## 2014-07-27 DIAGNOSIS — J9621 Acute and chronic respiratory failure with hypoxia: Secondary | ICD-10-CM | POA: Insufficient documentation

## 2014-07-27 LAB — CULTURE, BLOOD (ROUTINE X 2)
CULTURE: NO GROWTH
Culture: NO GROWTH

## 2014-07-27 LAB — GLUCOSE, CAPILLARY
GLUCOSE-CAPILLARY: 239 mg/dL — AB (ref 70–99)
GLUCOSE-CAPILLARY: 315 mg/dL — AB (ref 70–99)
Glucose-Capillary: 135 mg/dL — ABNORMAL HIGH (ref 70–99)
Glucose-Capillary: 304 mg/dL — ABNORMAL HIGH (ref 70–99)
Glucose-Capillary: 342 mg/dL — ABNORMAL HIGH (ref 70–99)
Glucose-Capillary: 283 mg/dL — ABNORMAL HIGH (ref 70–99)

## 2014-07-27 MED ORDER — INSULIN ASPART 100 UNIT/ML ~~LOC~~ SOLN
0.0000 [IU] | SUBCUTANEOUS | Status: DC
  Administered 2014-07-27: 15 [IU] via SUBCUTANEOUS
  Administered 2014-07-28 (×2): 7 [IU] via SUBCUTANEOUS
  Administered 2014-07-28: 4 [IU] via SUBCUTANEOUS

## 2014-07-27 MED ORDER — METRONIDAZOLE IN NACL 5-0.79 MG/ML-% IV SOLN
500.0000 mg | Freq: Three times a day (TID) | INTRAVENOUS | Status: DC
  Administered 2014-07-27 – 2014-07-28 (×5): 500 mg via INTRAVENOUS
  Filled 2014-07-27 (×6): qty 100

## 2014-07-27 MED ORDER — INSULIN GLARGINE 100 UNIT/ML ~~LOC~~ SOLN
34.0000 [IU] | Freq: Every day | SUBCUTANEOUS | Status: DC
  Administered 2014-07-27 – 2014-07-29 (×3): 34 [IU] via SUBCUTANEOUS
  Filled 2014-07-27 (×4): qty 0.34

## 2014-07-27 MED ORDER — DOCUSATE SODIUM 100 MG PO CAPS
100.0000 mg | ORAL_CAPSULE | Freq: Two times a day (BID) | ORAL | Status: DC
  Administered 2014-07-27 – 2014-08-01 (×5): 100 mg via ORAL
  Filled 2014-07-27 (×23): qty 1

## 2014-07-27 MED ORDER — SODIUM CHLORIDE 0.9 % IV SOLN
1.5000 g | Freq: Four times a day (QID) | INTRAVENOUS | Status: DC
  Administered 2014-07-27 – 2014-07-28 (×4): 1.5 g via INTRAVENOUS
  Filled 2014-07-27 (×7): qty 1.5

## 2014-07-27 NOTE — Consult Note (Addendum)
WOC wound consult note Reason for Consult: Consult requested for drainage near rectum; pt was not aware there was a problem.  Area was noted by the bedside nurse R/T significant amt drainage on bedding. Wound type: Full thickness wound to right inner buttock, located near rectum and gluteal fold. Pressure Ulcer POA: This is NOT a pressure ulcer. Measurement: .3X.3X.3cm Wound bed: Narrow opening, unable to visualize wound bed. Drainage (amount, consistency, odor)  Large amt thick tan drainage, strong odor Periwound: Erythremia surrounding affected area to 2 cm, painful to touch Dressing procedure/placement/frequency: Wound is too small to pack.  Recommend surgical consult to perform I&D to facilitate drainage and allow for packing.  Discussed plan of care with Dr Johnsie Cancel.  Culture obtained from wound. Will defer to CCS from further plan of care. Please re-consult if further assistance is needed.  Thank-you,  Julien Girt MSN, [place], [place], [place], [place]

## 2014-07-27 NOTE — Progress Notes (Signed)
I spoke with Dr Sherral Hammers who agreed  to take pt on their service.  Kerin Ransom PA-C 07/27/2014 9:28 AM

## 2014-07-27 NOTE — Consult Note (Signed)
Reason for Consult  Perirectal abscess Referring Physician:  Erin Hearing NP  Thomas Wright is an 68 y.o. male.  HPI: Pt admitted 07/21/14 with chest pain, AF, and malaise.  He had some profuse diarrhea after taking Epsom salts. Multiple medical issues as noted below.  On admit WBC was 18K, glucose 393.  He had recently been hospitalized with a finger infection in August and was due to have surgery the following week. He was diagnosed with DKA in addition to his other medical issues.  Hemglobin A1C was  11.8, doen from 13. He has not taken his medicines for 2 months prior to this admit.    His AF was controlled with Cardizem and amiodarone.  His EF is down to 25-30%, with thrombus in the LA. He has had continued rectal pain and was treated as a hemorrhoid, and then developed some rectal drainage, which has been fairly copious.  Currently on a carb-modified diet, and tolerating PO's well.  His rectum and body hurt while turning, he is fairly frustrated with all the wires, and tubes.  TM 99.2, labs yesterday shows WBC 18.5.   We are ask to see.   Past Medical History  Diagnosis Date  Cardiomyopathy    EF originally 20%-now 45%, no CAD on cath     Atrial fibrillation on apixaban since 07/24/14   H/O aortic valve replacement with tissue graft, 2008      Diabetes mellitus with DKA   Hypertension   Atrial fibrillation   Hypertriglyceridemia   Back pain           Past Surgical History  Procedure Laterality Date  . Cardiac surgery  05/2007    Aortic Valve Replacement  . I&d extremity Right 05/05/2014    Procedure: IRRIGATION AND DEBRIDEMENT EXTREMITY;  Surgeon: [name], MD;  Location: [facility];  Service: Orthopedics;  Laterality: Right;  . Tee without cardioversion N/A 07/23/2014    Procedure: TRANSESOPHAGEAL ECHOCARDIOGRAM (TEE);  Surgeon: Josue Hector, MD;  Location[facility] ENDOSCOPY;  Service: Cardiovascular;  Laterality: N/A;  . Cardioversion N/A 07/23/2014    Procedure: CARDIOVERSION;   Surgeon: Josue Hector, MD;  Location: [facility] ENDOSCOPY;  Service: Cardiovascular;  Laterality: N/A;    Family History  Problem Relation Age of Onset  . Colon cancer Father   . Liver disease Brother   . Heart murmur Child   . Congenital heart disease Other     Social History:  reports that he has never smoked. He has never used smokeless tobacco. He reports that he does not drink alcohol or use illicit drugs.  Allergies: No Known Allergies  Medications:  Prior to Admission:  Prescriptions prior to admission  Medication Sig Dispense Refill Last Dose  . aspirin 81 MG tablet Take 81 mg by mouth daily.   Past Week at Unknown time  . atorvastatin (LIPITOR) 10 MG tablet Take 10 mg by mouth every morning.   Past Month at Unknown time  . carvedilol (COREG) 25 MG tablet Take 1 tablet (25 mg total) by mouth 2 (two) times daily with a meal. 60 tablet 6 07/20/2014 at 6pm  . doxycycline (VIBRAMYCIN) 100 MG capsule Take 100 mg by mouth once.   07/20/2014 at Unknown time  . furosemide (LASIX) 20 MG tablet Take 1 tablet (20 mg total) by mouth daily. 90 tablet 2 Past Week at Unknown time  . glipiZIDE (GLUCOTROL) 5 MG tablet Take 1 tablet (5 mg total) by mouth daily before breakfast. 90 tablet 0  07/20/2014 at Unknown time  . HYDROcodone-acetaminophen (NORCO/VICODIN) 5-325 MG per tablet Take 1-2 tablets by mouth every 4 (four) hours as needed for moderate pain. 30 tablet 0 over 30 days at Unknown time  . losartan (COZAAR) 50 MG tablet Take 1 tablet (50 mg total) by mouth daily. 90 tablet 2 Past Week at Unknown time  . metFORMIN (GLUCOPHAGE) 1000 MG tablet Take 1,000 mg by mouth 2 (two) times daily with a meal.   07/20/2014 at Unknown time   Scheduled: . amiodarone  200 mg Oral BID  . ampicillin-sulbactam (UNASYN) IV  1.5 g Intravenous Q6H  . antiseptic oral rinse  7 mL Mouth Rinse BID  . apixaban  5 mg Oral BID  . aspirin EC  81 mg Oral Daily  . atorvastatin  10 mg Oral q morning - 10a  . carvedilol  25 mg  Oral BID WC  . hydrocortisone  25 mg Rectal BID  . insulin aspart  0-5 Units Subcutaneous QHS  . insulin aspart  0-9 Units Subcutaneous TID WC  . insulin aspart  4 Units Subcutaneous TID WC  . insulin glargine  28 Units Subcutaneous QHS  . metronidazole  500 mg Intravenous Q8H  . polyethylene glycol  17 g Oral Daily   Continuous: . sodium chloride Stopped (07/26/14 0830)   WSF:[id number], dextrose, Influenza vac split quadrivalent PF, ondansetron (ZOFRAN) IV, oxyCODONE Anti-infectives    Start     Dose/Rate Route Frequency Ordered Stop   07/27/14 1000  ampicillin-sulbactam (UNASYN) 1.5 g in sodium chloride 0.9 % 50 mL IVPB     1.5 g100 mL/hr over 30 Minutes Intravenous Every 6 hours 07/27/14 0924     07/27/14 1000  metroNIDAZOLE (FLAGYL) IVPB 500 mg     500 mg100 mL/hr over 60 Minutes Intravenous Every 8 hours 07/27/14 0924        Results for orders placed or performed during the hospital encounter of 07/21/14 (from the past 48 hour(s))  Glucose, capillary     Status: Abnormal   Collection Time: 07/25/14  4:16 PM  Result Value Ref Range   Glucose-Capillary 271 (H) 70 - 99 mg/dL  Glucose, capillary     Status: Abnormal   Collection Time: 07/25/14  9:25 PM  Result Value Ref Range   Glucose-Capillary 252 (H) 70 - 99 mg/dL  CBC     Status: Abnormal   Collection Time: 07/26/14  3:35 AM  Result Value Ref Range   WBC 18.5 (H) 4.0 - 10.5 K/uL   RBC 4.40 4.22 - 5.81 MIL/uL   Hemoglobin 12.2 (L) 13.0 - 17.0 g/dL   HCT 35.7 (L) 39.0 - 52.0 %   MCV 81.1 78.0 - 100.0 fL   MCH 27.7 26.0 - 34.0 pg   MCHC 34.2 30.0 - 36.0 g/dL   RDW 12.7 11.5 - 15.5 %   Platelets 304 150 - 400 K/uL  Basic metabolic panel     Status: Abnormal   Collection Time: 07/26/14  3:35 AM  Result Value Ref Range   Sodium 132 (L) 137 - 147 mEq/L   Potassium 4.3 3.7 - 5.3 mEq/L    Comment: DELTA CHECK NOTED   Chloride 97 96 - 112 mEq/L   CO2 23 19 - 32 mEq/L   Glucose, Bld 204 (H) 70 - 99 mg/dL   BUN 8 6 -  23 mg/dL   Creatinine, Ser 0.52 0.50 - 1.35 mg/dL   Calcium 8.3 (L) 8.4 - 10.5 mg/dL   GFR calc non  Af Amer >90 >90 mL/min   GFR calc Af Amer >90 >90 mL/min    Comment: (NOTE) The eGFR has been calculated using the CKD EPI equation. This calculation has not been validated in all clinical situations. eGFR's persistently <90 mL/min signify possible Chronic Kidney Disease.    Anion gap 12 5 - 15  Glucose, capillary     Status: Abnormal   Collection Time: 07/26/14  8:46 AM  Result Value Ref Range   Glucose-Capillary 178 (H) 70 - 99 mg/dL  Glucose, capillary     Status: Abnormal   Collection Time: 07/26/14 11:52 AM  Result Value Ref Range   Glucose-Capillary 283 (H) 70 - 99 mg/dL  Glucose, capillary     Status: Abnormal   Collection Time: 07/26/14  4:56 PM  Result Value Ref Range   Glucose-Capillary 285 (H) 70 - 99 mg/dL  Glucose, capillary     Status: Abnormal   Collection Time: 07/26/14  8:58 PM  Result Value Ref Range   Glucose-Capillary 337 (H) 70 - 99 mg/dL  Glucose, capillary     Status: Abnormal   Collection Time: 07/27/14  7:45 AM  Result Value Ref Range   Glucose-Capillary 135 (H) 70 - 99 mg/dL  Glucose, capillary     Status: Abnormal   Collection Time: 07/27/14 12:32 PM  Result Value Ref Range   Glucose-Capillary 239 (H) 70 - 99 mg/dL  Glucose, capillary     Status: Abnormal   Collection Time: 07/27/14  2:11 PM  Result Value Ref Range   Glucose-Capillary 304 (H) 70 - 99 mg/dL    Dg Chest Port 1 View  07/25/2014   CLINICAL DATA:  Short of breath. History of cardiomyopathy and aortic valve replacement. History of hypertension.  EXAM: PORTABLE CHEST - 1 VIEW  COMPARISON:  07/23/2014.  FINDINGS: Changes from cardiac surgery are stable. Cardiac silhouette is normal in overall size and configuration. No mediastinal or hilar masses.  Stable elevation the right hemidiaphragm.  Lungs are clear.  No pleural effusion or pneumothorax.  IMPRESSION: No acute cardiopulmonary disease.    Electronically Signed   By: Lajean Manes M.D.   On: 07/25/2014 17:46    Review of Systems  Constitutional: Negative for fever, chills and weight loss.  Cardiovascular: Positive for chest pain, palpitations and leg swelling.  All other systems reviewed and are negative.  Blood pressure 120/84, pulse 109, temperature 99.1 F (37.3 C), temperature source Oral, resp. rate 24, height 6' (1.829 m), weight 101.7 kg (224 lb 3.3 oz), SpO2 94 %. Physical Exam  Constitutional: He is oriented to person, place, and time. He appears well-developed and well-nourished. No distress.  He is rather anxious and his rectum is very sore.  HENT:  Head: Normocephalic and atraumatic.  Nose: Nose normal.  Eyes: Conjunctivae are normal. Right eye exhibits no discharge. Left eye exhibits no discharge. No scleral icterus.  Neck: Normal range of motion. Neck supple. No JVD present. No tracheal deviation present. No thyromegaly present.  Cardiovascular:  Murmur heard. He is in AF with HR over 100.  Respiratory: Effort normal and breath sounds normal. No respiratory distress. He has no wheezes. He has no rales. He exhibits no tenderness.  GI: Soft. Bowel sounds are normal. He exhibits no distension and no mass. There is no tenderness. There is no rebound and no guarding.  Genitourinary:  Left perirectal abscess with open area about 2 mm in diameter.  No fluctulence noted at the site.  Skin around it is  irritated and a little red, but not much cellulitis.  Foley still in place.  Musculoskeletal: He exhibits edema (trace). He exhibits no tenderness.  Lymphadenopathy:    He has no cervical adenopathy.  Neurological: He is alert and oriented to person, place, and time.  Skin: Skin is warm and dry. No rash noted. No erythema. No pallor.     Psychiatric: He has a normal mood and affect. His behavior is normal. Judgment and thought content normal.    Assessment/Plan: 1.  Peri rectal abscess, draining 2.  AF with  RVR, on Eliquis 3.  CM EF 25-30% 4.  AODM  Poor control 5.  Hypertension 6.  S/p AVR  Plan:  Agree with antibiotics, Sitz baths, and local wound care.  I think this will improve without the need to take him off Eliquis and surgery.  It is very well drained right now.  Thomas Wright 07/27/2014, 3:26 PM

## 2014-07-27 NOTE — Progress Notes (Addendum)
Patient ID: Thomas Wright, male   DOB: 14-Sep-1946, 68 y.o.   MRN: [id number]      Subjective:  Slightly improved malaise  And SOB.  Worried about finger   Objective: Vital signs in last 24 hours: Temp:  [98.4 F (36.9 C)-99.4 F (37.4 C)] 98.5 F (36.9 C) (11/10 0746) Pulse Rate:  [41-110] 44 (11/10 0746) Resp:  [14-29] 14 (11/10 0746) BP: (71-129)/(52-80) 122/52 mmHg (11/10 0746) SpO2:  [90 %-99 %] 95 % (11/10 0746) Weight:  [101.7 kg (224 lb 3.3 oz)] 101.7 kg (224 lb 3.3 oz) (11/10 0554) Last BM Date: 07/26/14  Intake/Output from previous day: 11/09 0701 - 11/10 0700 In: 120 [P.O.:120] Out: 2700 [Urine:2700] Intake/Output this shift:    Medications . amiodarone  200 mg Oral BID  . antiseptic oral rinse  7 mL Mouth Rinse BID  . apixaban  5 mg Oral BID  . aspirin EC  81 mg Oral Daily  . atorvastatin  10 mg Oral q morning - 10a  . carvedilol  25 mg Oral BID WC  . hydrocortisone  25 mg Rectal BID  . insulin aspart  0-5 Units Subcutaneous QHS  . insulin aspart  0-9 Units Subcutaneous TID WC  . insulin aspart  4 Units Subcutaneous TID WC  . insulin glargine  28 Units Subcutaneous QHS  . polyethylene glycol  17 g Oral Daily   . sodium chloride Stopped (07/26/14 0830)   PE: Affect appropriate Chronically ill white male  HEENT: normal Neck supple with no adenopathy JVP normal no bruits no thyromegaly Lungs clear with no wheezing and good diaphragmatic motion Heart:  S1/S2 SEM  murmur, no rub, gallop or click PMI normal Abdomen: benighn, BS positve, no tenderness, no AAA no bruit.  No HSM or HJR Distal pulses intact with no bruits No edema Neuro non-focal Dressing on right middle finger  No muscular weakness Nurse indicates drainage from rectal area    Lab Results:   Recent Labs  07/25/14 0325 07/26/14 0335  WBC 19.4* 18.5*  HGB 11.7* 12.2*  HCT 34.2* 35.7*  PLT 285 304   BMET  Recent Labs  07/25/14 0325 07/26/14 0335  NA 132* 132*  K 3.5* 4.3    CL 97 97  CO2 25 23  GLUCOSE 192* 204*  BUN 7 8  CREATININE 0.64 0.52  CALCIUM 8.1* 8.3*    Assessment/Plan  68 year old male with paroxysmal atrial fibrillation, stopped amiodarone in 2013 after no further episodes, with prior episode of postoperative atrial fibrillation following bioprosthetic aortic valve replacement in 2008 here with atypical chest discomfort, fatigue and was found to be in atrial fibrillation with RVR and in diabetic ketoacidosis.  1. Parox a-fib with RVR - we will increase carvedilol to 25 mg po bid   TEE with LAA thrombus  Continue eliquis  Plan [place] 3 weeks   2. H/O aortic valve replacement with tissue graft normal gradient and no SBE on TEE on 07/23/2014   3. Cardiomyopathy     Appears euvolemic   4. Diabetic ketoacidosis - resolved, managed by hospitalist service, started on Lantus, will need to be discharged on it    5. Tenosynovitis of finger  Need to see if Dr Burney Gauze can see in house  Today  Not likely ready for surgery [place]  With LAA thrombu Not ideal to stop anticoagulation and better control of DM will help wound healing  However he will not be able to have surgery for 6-8 weeks If done after  [place]  6.  Skin care consult per nurse for rectal drainage  Patient has rectal abscess and will need general surgical consult.   Given stable cardiac issues and ongoing issues with weakness, DM, finger surgery and now rectal abscess will ask Hospitalist to assume primary care   LOS: 6 days    Jenkins Rouge

## 2014-07-27 NOTE — Progress Notes (Signed)
Physical Therapy Treatment Patient Details Name: Thomas Wright MRN: [id number] DOB: [birth year] Today's Date: 07/27/2014    History of Present Illness 68 year old male with paroxysmal atrial fibrillation, stopped amiodarone in 2013 after no further episodes, with prior episode of postoperative atrial fibrillation following bioprosthetic aortic valve replacement in 2008 here with atypical chest discomfort, fatigue and was found to be in atrial fibrillation with RVR and in diabetic ketoacidosis    PT Comments    Patient tolerated session well, ambulated in hall with assist and RW. Patient educated on positioning and LE activities.  Will continue to see and progress as tolerated.   Follow Up Recommendations  Home health PT;Supervision/Assistance - 24 hour (pending progress)     Equipment Recommendations  Rolling walker with 5" wheels    Recommendations for Other Services       Precautions / Restrictions Precautions Precautions: Fall    Mobility  Bed Mobility Overal bed mobility: Needs Assistance Bed Mobility: Rolling;Supine to Sit Rolling: Min guard   Supine to sit: Min assist     General bed mobility comments: assist to elevate to upright position  Transfers Overall transfer level: Needs assistance Equipment used: Rolling walker (2 wheeled) Transfers: Sit to/from Stand Sit to Stand: Min assist         General transfer comment: min assist for elevation to upright with elevated bed surface and VCs to come to EOB and cues for safe hand placement with use of RW  Ambulation/Gait Ambulation/Gait assistance: Min assist Ambulation Distance (Feet): 90 Feet Assistive device: Rolling walker (2 wheeled) Gait Pattern/deviations: Step-to pattern;Decreased stride length;Trunk flexed;Drifts right/left (emerging step through) Gait velocity: decreased Gait velocity interpretation: Below normal speed for age/gender General Gait Details: VCs for positioning within Rw and upright  posture, cues for safety.  multiple rest breaks during ambualtion.    Stairs            Wheelchair Mobility    Modified Rankin (Stroke Patients Only)       Balance Overall balance assessment: Needs assistance Sitting-balance support: Feet supported Sitting balance-Leahy Scale: Good     Standing balance support: Bilateral upper extremity supported;During functional activity Standing balance-Leahy Scale: Fair Standing balance comment: min assist at times                    Cognition Arousal/Alertness: Awake/alert Behavior During Therapy: WFL for tasks assessed/performed Overall Cognitive Status: Within Functional Limits for tasks assessed                      Exercises      General Comments General comments (skin integrity, edema, etc.): Educated on LE ther ex and positioning for comfort with buttock wound.   Assisted patient with donning of padded briefs.       Pertinent Vitals/Pain Pain Assessment: 0-10 Pain Score: 4  Pain Location: buttocks Pain Descriptors / Indicators: Discomfort;Sore Pain Intervention(s): Limited activity within patient's tolerance;Monitored during session;Relaxation;Repositioned    Home Living                      Prior Function            PT Goals (current goals can now be found in the care plan section) Acute Rehab PT Goals Patient Stated Goal: to go home  PT Goal Formulation: With patient Time For Goal Achievement: 08/09/14 Potential to Achieve Goals: Good    Frequency  Min 3X/week    PT Plan Current plan remains appropriate  Co-evaluation             End of Session Equipment Utilized During Treatment: Gait belt Activity Tolerance: Patient tolerated treatment well;Patient limited by fatigue Patient left: in chair;with call bell/phone within reach     Time: 1140-1205 PT Time Calculation (min) (ACUTE ONLY): 25 min  Charges:  $Gait Training: 8-22 mins $Therapeutic Activity: 8-22  mins                    G CodesDuncan Dull Aug 03, 2014, 2:51 PM [name], [place] DPT  [phone]

## 2014-07-27 NOTE — Progress Notes (Signed)
[name]  JLEN WINTLE LTJ:[id number] DOB: 08/17/1946 DOA: 07/21/2014 PCP: [name], MD   Brief narrative: 68 yo M with PMH of DM, HTN, Afib, h/o bioprosthetic valve replacement in 2008, h/o tenosynovitis and I&D in 8/15 who presented to the ER with generalized malaise, and chest pain, and was found to have Afib with RVR.  Cards admitting him.    He had not been taking his oral hypoglycemics for months now - his CBGs had been running in 300s-400s for weeks.  His CBGs have become much better controlled while hospitalized with the use of long-acting insulin. Since plans are to discharge home on insulin Glucotrol was discontinued on 11/9 in favor of metformin. Pre authorization was completed for Lantus Solostar flexpen and patient was approved; email printed and placed on shadow chart.   Has had leukocytosis since admission. Has been experiencing rectal pain presumed to be hemorrhoidal but in past 24 hours developed redness and spontaneous drainage c/w peri rectal abscess. Empiric anbx's started and surgery consulted.  HPI/Subjective: Alert and endorsing drainage at buttock area. States had been having pain in that area before admission  Assessment/Plan:    Diabetic ketoacidosis / diabetes mellitus Suspect initial DKA driven by smouldering perirectal abscess-Hemoglobin A1c > 11 c/w poorly controlled diabetes -now on Lantus-will need insulin at discharge-diabetes educator consulted-pre authorization for Lantus Flexpen completed 11/6 and email response confirming approval printed and placed in shadow chart-11/09 added meal coverage as CBGs were elevated in inpt setting (and will likely be higher after d/c) unfortunately they remain up and likely this is due to perirectal abscess -increase Lantus to 34 units daily -Increase to resistant SSI; calculate meal needs from total SSI required overnight. -already on ASA and lipitor - needs ACE as  soon as BP tolerates (currently marginally low at times)    Atrial fibrillation with RVR Per cardiology-persistent tachycardia likely influenced by infection (perirectal abscess)    Dehydration Resolved- Due to DKA-decreased IVFs to kvo 11/6 since FU CXR with mild edema     Acute respiratory failure with hypoxia due to progressive systolic dysfunction EF 07-62% EF in March 45-50% with associated grade 2 diastolic dysfunction-echo this admission demonstrated worsened EF now down to 25% likely mediated by persistent tachycardia-cont oxygen-euvolemic off Lasix   Peri rectal abscess/Hemorrhoids/persistent leukocytosis Initially treated presumptively for hemorrhoids but developed foul smelling, yellow/tan drainage spontaneously from left buttock near rectum c/w peri rectal abscess-also has externa hemorrhoids on exam- cont Anusol HCL- begin empiric Unasyn and Flagyl- consult surgery for ? I/D noting pt on Eliquis which would need to be held for 24 hours before any surgical procedures- ? If needs pelvis CT to clarify- no lower abd pain reported    Hypertension Blood pressure soft in setting of tachycardia and volume depletion (on Carvedilol and Cozaar at home)    H/O aortic valve replacement with tissue graft    Tenosynovitis of finger primary team to ask Dr. Burney Gauze to see BEFORE DCCV since can't stop anti coag for 6-8 weeks after that is done    Patient non adherence Discussed at length with patient 11/5-okay with plans to transition to insulin at discharge along with metformin-agrees diabetes education consult would be beneficial    DVT prophylaxis: eliquis Code Status: full Disposition: SDU in setting of persistent tachycardia- possible transition to telemetry once infectionstabilized  Antibiotics: Unasyn 11/20> Flagyl 11/10 >  Objective: Blood pressure 122/52, pulse 44, temperature 98.5 F (36.9 C), temperature  source Oral, resp. rate 14, height 6' (1.829 m), weight 224 lb 3.3 oz  (101.7 kg), SpO2 95 %.  Intake/Output Summary (Last 24 hours) at 07/27/14 0925 Last data filed at 07/27/14 0500  Gross per 24 hour  Intake    120 ml  Output   2700 ml  Net  -2580 ml    Exam: Gen: No acute respiratory distress Chest: CTA, no wheeze; RA Cardiac: Irregular, slightly tachycardic rate and rhythm, S1-S2, no rubs murmurs or gallops, no peripheral edema Abdomen: Soft nontender nondistended without obvious hepatosplenomegaly, no ascites Rectal: Now with diffuse redness perirectal area as well as left gluteal/buttock region- spontaneously draining foul smelling, yellow/tan purulent material from left buttock vis small subcm opening Extremities: Symmetrical in appearance without cyanosis, clubbing or effusion  Scheduled Meds:  Scheduled Meds: . amiodarone  200 mg Oral BID  . ampicillin-sulbactam (UNASYN) IV  1.5 g Intravenous Q6H  . antiseptic oral rinse  7 mL Mouth Rinse BID  . apixaban  5 mg Oral BID  . aspirin EC  81 mg Oral Daily  . atorvastatin  10 mg Oral q morning - 10a  . carvedilol  25 mg Oral BID WC  . hydrocortisone  25 mg Rectal BID  . insulin aspart  0-5 Units Subcutaneous QHS  . insulin aspart  0-9 Units Subcutaneous TID WC  . insulin aspart  4 Units Subcutaneous TID WC  . insulin glargine  28 Units Subcutaneous QHS  . metronidazole  500 mg Intravenous Q8H  . polyethylene glycol  17 g Oral Daily    Data Reviewed: Basic Metabolic Panel:  Recent Labs Lab 07/21/14 1621  07/22/14 0419 07/23/14 0230 07/24/14 0350 07/25/14 0325 07/26/14 0335  NA 138  < > 136* 133* 131* 132* 132*  K 3.8  < > 3.7 3.9 3.8 3.5* 4.3  CL 100  < > 103 99 96 97 97  CO2 19  < > 18* 19 21 25 23   GLUCOSE 238*  < > 196* 217* 235* 192* 204*  BUN 19  < > 19 10 8 7 8   CREATININE 0.70  < > 0.56 0.54 0.55 0.64 0.52  CALCIUM 9.1  < > 8.4 8.4 8.2* 8.1* 8.3*  MG 2.1  --   --   --   --   --   --   < > = values in this interval not displayed.   Liver Function Tests:  Recent  Labs Lab 07/23/14 0230  AST 14  ALT 10  ALKPHOS 219*  BILITOT 0.3  PROT 6.2  ALBUMIN 2.5*   CBC:  Recent Labs Lab 07/21/14 0959 07/22/14 0240 07/23/14 0230 07/24/14 0350 07/25/14 0325 07/26/14 0335  WBC 18.8* 20.4* 20.6* 20.5* 19.4* 18.5*  NEUTROABS 15.1*  --   --   --   --   --   HGB 15.4 13.3 13.6 12.8* 11.7* 12.2*  HCT 44.4 39.0 39.7 37.5* 34.2* 35.7*  MCV 82.1 82.8 81.5 81.9 81.2 81.1  PLT 299 301 291 277 285 304   Cardiac Enzymes:  Recent Labs Lab 07/21/14 1012 07/21/14 1621 07/21/14 1917 07/22/14 0248  TROPONINI <0.30 <0.30 <0.30 <0.30   CBG:  Recent Labs Lab 07/26/14 0846 07/26/14 1152 07/26/14 1656 07/26/14 2058 07/27/14 0745  GLUCAP 178* 283* 285* 337* 135*    Recent Results (from the past 240 hour(s))  Culture, blood (routine x 2)     Status: None   Collection Time: 07/21/14  4:26 PM  Result Value Ref Range Status  Specimen Description BLOOD RIGHT HAND  Final   Special Requests BOTTLES DRAWN AEROBIC AND ANAEROBIC 5CC  Final   Culture  Setup Time   Final    07/21/2014 22:18 Performed at [facility]    Culture   Final    NO GROWTH 5 DAYS Performed at [facility]    Report Status 07/27/2014 FINAL  Final  Culture, blood (routine x 2)     Status: None   Collection Time: 07/21/14  4:32 PM  Result Value Ref Range Status   Specimen Description BLOOD LEFT ARM  Final   Special Requests BOTTLES DRAWN AEROBIC AND ANAEROBIC 10CC  Final   Culture  Setup Time   Final    07/21/2014 22:17 Performed at [facility]   Final    NO GROWTH 5 DAYS Performed at [facility]    Report Status 07/27/2014 FINAL  Final  MRSA PCR Screening     Status: None   Collection Time: 07/21/14  6:41 PM  Result Value Ref Range Status   MRSA by PCR NEGATIVE NEGATIVE Final    Comment:        The GeneXpert MRSA Assay (FDA approved for NASAL specimens only), is one component of a comprehensive MRSA  colonization surveillance program. It is not intended to diagnose MRSA infection nor to guide or monitor treatment for MRSA infections.      Studies:  Recent x-ray studies have been reviewed in detail by the Attending Physician  Time spent :  [place], ANP [facility] Office  [phone] Pager [phone]  On-Call/Text Page:      [email or web address]      password [email or web address]  If 7PM-7AM, please contact night-coverage [email or web address] Password [email or web address] 07/27/2014, 9:25 AM   LOS: 6 days  Examined patient and discussed assessment and plan with ANP Ebony Hail and agree with above. Patient with multiple complex medical problems> 40 minutes spent in direct patient care

## 2014-07-28 ENCOUNTER — Other Ambulatory Visit: Payer: Self-pay | Admitting: Orthopedic Surgery

## 2014-07-28 LAB — COMPREHENSIVE METABOLIC PANEL
ALT: 16 U/L (ref 0–53)
AST: 17 U/L (ref 0–37)
Albumin: 2.2 g/dL — ABNORMAL LOW (ref 3.5–5.2)
Alkaline Phosphatase: 139 U/L — ABNORMAL HIGH (ref 39–117)
Anion gap: 11 (ref 5–15)
BUN: 12 mg/dL (ref 6–23)
CO2: 26 mEq/L (ref 19–32)
Calcium: 8.1 mg/dL — ABNORMAL LOW (ref 8.4–10.5)
Chloride: 93 mEq/L — ABNORMAL LOW (ref 96–112)
Creatinine, Ser: 0.63 mg/dL (ref 0.50–1.35)
GFR calc Af Amer: >90 mL/min (ref >90)
GFR calc non Af Amer: >90 mL/min (ref >90)
Glucose, Bld: 110 mg/dL — ABNORMAL HIGH (ref 70–99)
Potassium: 4.1 mEq/L (ref 3.7–5.3)
Sodium: 130 mEq/L — ABNORMAL LOW (ref 137–147)
Total Bilirubin: 0.4 mg/dL (ref 0.3–1.2)
Total Protein: 6.1 g/dL (ref 6.0–8.3)

## 2014-07-28 LAB — CBC WITH DIFFERENTIAL/PLATELET
BASOS PCT: 0 % (ref 0–1)
Basophils Absolute: 0 10*3/uL (ref 0.0–0.1)
EOS ABS: 0.2 10*3/uL (ref 0.0–0.7)
Eosinophils Relative: 1 % (ref 0–5)
HCT: 35.1 % — ABNORMAL LOW (ref 39.0–52.0)
HEMOGLOBIN: 11.8 g/dL — AB (ref 13.0–17.0)
LYMPHS PCT: 9 % — AB (ref 12–46)
Lymphs Abs: 2.2 10*3/uL (ref 0.7–4.0)
MCH: 28 pg (ref 26.0–34.0)
MCHC: 33.6 g/dL (ref 30.0–36.0)
MCV: 83.2 fL (ref 78.0–100.0)
Monocytes Absolute: 2.4 10*3/uL — ABNORMAL HIGH (ref 0.1–1.0)
Monocytes Relative: 10 % (ref 3–12)
NEUTROS PCT: 80 % — AB (ref 43–77)
Neutro Abs: 19.4 10*3/uL — ABNORMAL HIGH (ref 1.7–7.7)
Platelets: 360 10*3/uL (ref 150–400)
RBC: 4.22 MIL/uL (ref 4.22–5.81)
RDW: 13 % (ref 11.5–15.5)
WBC MORPHOLOGY: INCREASED
WBC: 24.2 10*3/uL — AB (ref 4.0–10.5)

## 2014-07-28 LAB — GLUCOSE, CAPILLARY
GLUCOSE-CAPILLARY: 103 mg/dL — AB (ref 70–99)
GLUCOSE-CAPILLARY: 208 mg/dL — AB (ref 70–99)
Glucose-Capillary: 101 mg/dL — ABNORMAL HIGH (ref 70–99)
Glucose-Capillary: 187 mg/dL — ABNORMAL HIGH (ref 70–99)
Glucose-Capillary: 203 mg/dL — ABNORMAL HIGH (ref 70–99)
Glucose-Capillary: 256 mg/dL — ABNORMAL HIGH (ref 70–99)

## 2014-07-28 LAB — PROTIME-INR
INR: 1.58 — ABNORMAL HIGH (ref 0.00–1.49)
Prothrombin Time: 19 seconds — ABNORMAL HIGH (ref 11.6–15.2)

## 2014-07-28 LAB — MAGNESIUM: Magnesium: 1.8 mg/dL (ref 1.5–2.5)

## 2014-07-28 LAB — URINALYSIS, ROUTINE W REFLEX MICROSCOPIC
BILIRUBIN URINE: NEGATIVE
GLUCOSE, UA: 500 mg/dL — AB
KETONES UR: NEGATIVE mg/dL
Nitrite: NEGATIVE
PH: 5.5 (ref 5.0–8.0)
Protein, ur: NEGATIVE mg/dL
Specific Gravity, Urine: 1.012 (ref 1.005–1.030)
Urobilinogen, UA: 0.2 mg/dL (ref 0.0–1.0)

## 2014-07-28 LAB — URINE MICROSCOPIC-ADD ON

## 2014-07-28 MED ORDER — PIPERACILLIN-TAZOBACTAM 3.375 G IVPB
3.3750 g | Freq: Three times a day (TID) | INTRAVENOUS | Status: AC
  Administered 2014-07-28 – 2014-08-01 (×14): 3.375 g via INTRAVENOUS
  Filled 2014-07-28 (×15): qty 50

## 2014-07-28 MED ORDER — SODIUM CHLORIDE 0.9 % IV SOLN
INTRAVENOUS | Status: DC
  Administered 2014-07-28: 18:00:00 via INTRAVENOUS
  Administered 2014-07-30: 10 mL/h via INTRAVENOUS
  Administered 2014-08-01 – 2014-08-04 (×3): via INTRAVENOUS

## 2014-07-28 MED ORDER — INSULIN ASPART 100 UNIT/ML ~~LOC~~ SOLN
3.0000 [IU] | Freq: Three times a day (TID) | SUBCUTANEOUS | Status: DC
  Administered 2014-07-29 – 2014-07-30 (×3): 3 [IU] via SUBCUTANEOUS

## 2014-07-28 MED ORDER — CHLORHEXIDINE GLUCONATE 4 % EX LIQD
60.0000 mL | Freq: Once | CUTANEOUS | Status: DC
  Filled 2014-07-28: qty 60

## 2014-07-28 MED ORDER — CARVEDILOL 12.5 MG PO TABS
12.5000 mg | ORAL_TABLET | Freq: Two times a day (BID) | ORAL | Status: AC
  Administered 2014-07-28: 12.5 mg via ORAL
  Filled 2014-07-28: qty 1

## 2014-07-28 MED ORDER — METOPROLOL TARTRATE 1 MG/ML IV SOLN
10.0000 mg | Freq: Once | INTRAVENOUS | Status: AC
  Administered 2014-07-28: 10 mg via INTRAVENOUS
  Filled 2014-07-28: qty 10

## 2014-07-28 MED ORDER — INSULIN ASPART 100 UNIT/ML ~~LOC~~ SOLN
0.0000 [IU] | Freq: Three times a day (TID) | SUBCUTANEOUS | Status: DC
  Administered 2014-07-29 (×2): 4 [IU] via SUBCUTANEOUS
  Administered 2014-07-30: 3 [IU] via SUBCUTANEOUS
  Administered 2014-07-30 (×2): 7 [IU] via SUBCUTANEOUS
  Administered 2014-07-31: 3 [IU] via SUBCUTANEOUS
  Administered 2014-07-31: 7 [IU] via SUBCUTANEOUS
  Administered 2014-08-01: 4 [IU] via SUBCUTANEOUS
  Administered 2014-08-01: 7 [IU] via SUBCUTANEOUS
  Administered 2014-08-01 – 2014-08-02 (×2): 4 [IU] via SUBCUTANEOUS
  Administered 2014-08-02: 7 [IU] via SUBCUTANEOUS
  Administered 2014-08-03 (×2): 4 [IU] via SUBCUTANEOUS

## 2014-07-28 MED ORDER — CEFAZOLIN SODIUM-DEXTROSE 2-3 GM-% IV SOLR
2.0000 g | INTRAVENOUS | Status: AC
  Administered 2014-07-29: 2 g via INTRAVENOUS
  Filled 2014-07-28: qty 50

## 2014-07-28 MED ORDER — CARVEDILOL 25 MG PO TABS
37.5000 mg | ORAL_TABLET | Freq: Two times a day (BID) | ORAL | Status: DC
  Administered 2014-07-29 – 2014-08-07 (×20): 37.5 mg via ORAL
  Filled 2014-07-28 (×24): qty 1

## 2014-07-28 NOTE — Progress Notes (Signed)
07/28/2014 foley was removed at 1057, he had 150cc of yellow urine. [facility] RN.

## 2014-07-28 NOTE — Progress Notes (Signed)
Right long finger dressing changed at bedside last PM  Have discussed DIP level amputation with patient and he agrees with plan at this point   In light of other medical issues at hand this seems to be best option now   Can do under straight local on 11/11 or 11/12 in the afternoon as an add on  Will discuss with medical team today

## 2014-07-28 NOTE — Plan of Care (Signed)
Problem: Phase III Progression Outcomes Goal: Foley discontinued Outcome: Completed/Met Date Met:  07/28/14     

## 2014-07-28 NOTE — Progress Notes (Signed)
07/28/2014 patient was given sitz bath at 1400 and peri rectal area was clean with soap and water. [facility] RN.

## 2014-07-28 NOTE — Progress Notes (Signed)
Patient ID: SEQUOIA MINCEY, male   DOB: 1946-01-20, 68 y.o.   MRN: [id number] 5 Days Post-Op  Subjective: Pt feels ok today.  No pain in buttock region  Objective: Vital signs in last 24 hours: Temp:  [98.3 F (36.8 C)-99.2 F (37.3 C)] 98.3 F (36.8 C) (11/11 0400) Pulse Rate:  [63-110] 63 (11/11 0400) Resp:  [18-25] 25 (11/11 0400) BP: (109-131)/(57-87) 109/57 mmHg (11/11 0400) SpO2:  [94 %-98 %] 96 % (11/11 0400) Weight:  [223 lb 15.8 oz (101.6 kg)] 223 lb 15.8 oz (101.6 kg) (11/11 0500) Last BM Date: 07/27/14  Intake/Output from previous day: 11/10 0701 - 11/11 0700 In: 1040 [P.O.:840; IV Piggyback:200] Out: 1100 [Urine:1100] Intake/Output this shift:    PE: Buttock: There is a small amount of induration on the right perirectal area, but there is a small opening on the left perirectal side that is spontaneously draining some purulent material.  This is able to be milked until no further drainage was expressed.  Lab Results:   Recent Labs  07/26/14 0335 07/28/14 0320  WBC 18.5* 24.2*  HGB 12.2* 11.8*  HCT 35.7* 35.1*  PLT 304 360   BMET  Recent Labs  07/26/14 0335 07/28/14 0320  NA 132* 130*  K 4.3 4.1  CL 97 93*  CO2 23 26  GLUCOSE 204* 110*  BUN 8 12  CREATININE 0.52 0.63  CALCIUM 8.3* 8.1*   PT/INR  Recent Labs  07/28/14 0320  LABPROT 19.0*  INR 1.58*   CMP     Component Value Date/Time   NA 130* 07/28/2014 0320   K 4.1 07/28/2014 0320   CL 93* 07/28/2014 0320   CO2 26 07/28/2014 0320   GLUCOSE 110* 07/28/2014 0320   BUN 12 07/28/2014 0320   CREATININE 0.63 07/28/2014 0320   CALCIUM 8.1* 07/28/2014 0320   PROT 6.1 07/28/2014 0320   ALBUMIN 2.2* 07/28/2014 0320   AST 17 07/28/2014 0320   ALT 16 07/28/2014 0320   ALKPHOS 139* 07/28/2014 0320   BILITOT 0.4 07/28/2014 0320   GFRNONAA >90 07/28/2014 0320   GFRAA >90 07/28/2014 0320   Lipase  No results found for: LIPASE     Studies/Results: No results  found.  Anti-infectives: Anti-infectives    Start     Dose/Rate Route Frequency Ordered Stop   07/27/14 1000  ampicillin-sulbactam (UNASYN) 1.5 g in sodium chloride 0.9 % 50 mL IVPB     1.5 g100 mL/hr over 30 Minutes Intravenous Every 6 hours 07/27/14 0924     07/27/14 1000  metroNIDAZOLE (FLAGYL) IVPB 500 mg     500 mg100 mL/hr over 60 Minutes Intravenous Every 8 hours 07/27/14 0924         Assessment/Plan  1. Buttock abscess, spontaneously draining 2. Leukocytosis  Plan: 1. Patient does not need any operations at this point as buttock abscess is adequately drained. 2. Due to elevating WBC, which I think is doubtful to be fully related to buttock, as this appears well drained, I would recommend changing unasyn to Cipro or changing abx regimen to zosyn.  unasyn is not adequate coverage as e coli and other bacteria are resistant to this now. 3. Will continue to follow.  Cont sitz bathes.   LOS: 7 days    Natausha Jungwirth E 07/28/2014, 8:11 AM Pager: [phone]

## 2014-07-28 NOTE — Progress Notes (Addendum)
07/28/2014 cardiology was notified at 1620 after Lopressor was given heart rate was 106 to 113. Per Kerin Ransom notified cardiology if heart rate 120 and sustain. Continue to monitor patient. [facility] RN.

## 2014-07-28 NOTE — Progress Notes (Signed)
07/28/2014 patient heart rate at 1545 was 120's to 133 did notified Dr Thereasa Solo, order was given for Lopressor 10 mg iv. It was given at 1611. [facility] RN.

## 2014-07-28 NOTE — Progress Notes (Signed)
Patient ID: Thomas Wright, male   DOB: 06-Sep-1946, 68 y.o.   MRN: [id number]      Subjective: No cardiac complaints  Difficulty voiding through foley  Objective: Vital signs in last 24 hours: Temp:  [98.3 F (36.8 C)-99.2 F (37.3 C)] 98.3 F (36.8 C) (11/11 0400) Pulse Rate:  [63-110] 63 (11/11 0400) Resp:  [18-25] 25 (11/11 0400) BP: (109-131)/(57-87) 109/57 mmHg (11/11 0400) SpO2:  [94 %-98 %] 96 % (11/11 0400) Weight:  [101.6 kg (223 lb 15.8 oz)] 101.6 kg (223 lb 15.8 oz) (11/11 0500) Last BM Date: 07/27/14  Intake/Output from previous day: 11/10 0701 - 11/11 0700 In: 1040 [P.O.:840; IV Piggyback:200] Out: 1100 [Urine:1100] Intake/Output this shift: Total I/O In: 240 [P.O.:240] Out: 1 [Stool:1]  Medications . amiodarone  200 mg Oral BID  . antiseptic oral rinse  7 mL Mouth Rinse BID  . apixaban  5 mg Oral BID  . aspirin EC  81 mg Oral Daily  . atorvastatin  10 mg Oral q morning - 10a  . carvedilol  25 mg Oral BID WC  . docusate sodium  100 mg Oral BID  . hydrocortisone  25 mg Rectal BID  . insulin aspart  0-20 Units Subcutaneous 6 times per day  . insulin glargine  34 Units Subcutaneous QHS  . metronidazole  500 mg Intravenous Q8H  . piperacillin-tazobactam (ZOSYN)  IV  3.375 g Intravenous Q8H  . polyethylene glycol  17 g Oral Daily   . sodium chloride Stopped (07/26/14 0830)   PE: Affect appropriate Chronically ill white male  HEENT: normal Neck supple with no adenopathy JVP normal no bruits no thyromegaly Lungs clear with no wheezing and good diaphragmatic motion Heart:  S1/S2 SEM  murmur, no rub, gallop or click PMI normal Abdomen: benighn, BS positve, no tenderness, no AAA no bruit.  No HSM or HJR Distal pulses intact with no bruits No edema Neuro non-focal Dressing on right middle finger  No muscular weakness Nurse indicates drainage from rectal area    Lab Results:   Recent Labs  07/26/14 0335 07/28/14 0320  WBC 18.5* 24.2*  HGB 12.2*  11.8*  HCT 35.7* 35.1*  PLT 304 360   BMET  Recent Labs  07/26/14 0335 07/28/14 0320  NA 132* 130*  K 4.3 4.1  CL 97 93*  CO2 23 26  GLUCOSE 204* 110*  BUN 8 12  CREATININE 0.52 0.63  CALCIUM 8.3* 8.1*    Assessment/Plan  68 year old male with paroxysmal atrial fibrillation, stopped amiodarone in 2013 after no further episodes, with prior episode of postoperative atrial fibrillation following bioprosthetic aortic valve replacement in 2008 here with atypical chest discomfort, fatigue and was found to be in atrial fibrillation with RVR and in diabetic ketoacidosis.  1. Parox a-fib with RVR - we will increase carvedilol to 25 mg po bid   TEE with LAA thrombus  Will need to hold eliquis for amputation of finger 11/12?  Have left message with Dr Bertis Ruddy nurse to clarify wether this needs to be held  2. H/O aortic valve replacement with tissue graft normal gradient and no SBE on TEE on 07/23/2014   3. Cardiomyopathy     Appears euvolemic   4. Diabetic ketoacidosis - resolved, managed by hospitalist service, started on Lantus, will need to be discharged on it    5. Tenosynovitis of finger  Dr Burney Gauze has seen and plans bedside amputation  Holding eliquis until he clarifies 6.  Skin care consult per nurse  for rectal drainage  Patient has rectal abscess  Draining Dr Redmond Pulling has seen   Nurse will pull foley and if he cannot void on his own will insert new one    LOS: 7 days    Jenkins Rouge

## 2014-07-28 NOTE — Progress Notes (Signed)
[name]  LIVINGSTON DENNER JXB:[id number] DOB: 29-Oct-1945 DOA: 07/21/2014 PCP: [name], MD   Brief narrative: 68 yo M with PMH of DM, HTN, Afib, h/o bioprosthetic valve replacement in 2008, h/o tenosynovitis and I&D in 8/15 who presented to the ER with generalized malaise, and chest pain, and was found to have Afib with RVR.  Cards admitted him.    He had not been taking his oral hypoglycemics for months - his CBGs had been running in 300s-400s for weeks.  His CBGs have become much better controlled while hospitalized with the use of long-acting insulin. Since plans are to discharge home on insulin Glucotrol was discontinued on 11/9. Pre authorization was completed for Lantus Solostar flexpen and patient was approved; email printed and placed on shadow chart.   Has had leukocytosis since admission. Has been experiencing rectal pain presumed to be hemorrhoidal but developed redness and spontaneous drainage c/w peri rectal abscess. Empiric anbx's started and surgery consulted. Since area spontaneously draining no indications for incision and drainage.  HPI/Subjective: Alert and reporting pain in buttock region has greatly improved.  Denies cp, n/v, or abdom pain.  Having some difficulty w/ urinary incontinence s/p removal of his foley cath.   Assessment/Plan:  Diabetic ketoacidosis / diabetes mellitus Suspect initial DKA driven by smouldering perirectal abscess - Hemoglobin A1c > 11 - now on Lantus - will need insulin at discharge - diabetes educator consulted - pre authorization for Lantus Flexpen completed 11/6 and email response confirming approval printed and placed in shadow chart - 11/09 added meal coverage as CBGs were elevated in inpt setting (and will likely be higher after d/c) unfortunately they remain up and likely this is due to perirectal abscess - Lantus increased to 34 units daily, SSI increase to resistant on 11/10 - needs ACE  as soon as BP tolerates (currently marginally low at times)  Atrial fibrillation with RVR Per Cardiology - persistent tachycardia likely influenced by infection (perirectal abscess) - mild persistent tachycardia but has been stable on amiodarone and carvedilol - add prn IV lopressor and keep in SDU for monitoring   Dehydration Resolved - Due to DKA   Acute respiratory failure with hypoxia due to progressive systolic dysfunction EF 13-08% EF in March 45-50% with associated grade 2 diastolic dysfunction - echo this admission demonstrated worsened EF now down to 25% likely mediated by persistent tachycardia - cont oxygen - euvolemic off Lasix  Peri rectal abscess/Hemorrhoids/persistent leukocytosis Initially treated presumptively for hemorrhoids but developed foul smelling, yellow/tan drainage spontaneously from left buttock near rectum c/w peri rectal abscess - also has external hemorrhoids on exam - cont Anusol HCL - surgery noted since spontaneously draining no indications to pursue I/D at this time - white count has actually increased so hopefully will respond to antibiotic   Hypertension Blood pressure soft in setting of tachycardia and volume depletion (on Carvedilol and Cozaar at home)  H/O aortic valve replacement with tissue graft  Tenosynovitis of finger plan is to pursue minor surgical procedure at the bedside using local anesthesia - cardiologist in process of contacting surgeon to determine if patient needs to have eliquis stopped prior to procedure  Patient non adherence Discussed at length with patient 11/5 - okay with plans to transition to insulin at discharge along with metformin-agrees diabetes education consult would be beneficial  DVT prophylaxis: SCDs Code Status: FULL Disposition: SDU  Antibiotics: Unasyn 11/20> 11/11 Flagyl 11/10 > 11/11 Zosyn 11/11 >  Objective: Blood pressure 108/55, pulse 108, temperature 98.8 F (37.1 C), temperature source Oral, resp.  rate 26, height 6' (1.829 m), weight 223 lb 15.8 oz (101.6 kg), SpO2 92 %.  Intake/Output Summary (Last 24 hours) at 07/28/14 1318 Last data filed at 07/28/14 0944  Gross per 24 hour  Intake    680 ml  Output   1501 ml  Net   -821 ml    Exam: Gen: No acute respiratory distress Chest: CTA, no wheeze; RA Cardiac: Irregular, slightly tachycardic rate and rhythm, S1-S2, no rubs murmurs or gallops, no peripheral edema Abdomen: Soft nontender nondistended without obvious hepatosplenomegaly, no ascites Rectal: wound dressed and dry at time of exam Extremities: Symmetrical in appearance without cyanosis, clubbing or effusion  Scheduled Meds:  Scheduled Meds: . amiodarone  200 mg Oral BID  . antiseptic oral rinse  7 mL Mouth Rinse BID  . aspirin EC  81 mg Oral Daily  . atorvastatin  10 mg Oral q morning - 10a  . carvedilol  25 mg Oral BID WC  . docusate sodium  100 mg Oral BID  . hydrocortisone  25 mg Rectal BID  . insulin aspart  0-20 Units Subcutaneous 6 times per day  . insulin glargine  34 Units Subcutaneous QHS  . metronidazole  500 mg Intravenous Q8H  . piperacillin-tazobactam (ZOSYN)  IV  3.375 g Intravenous Q8H  . polyethylene glycol  17 g Oral Daily    Data Reviewed: Basic Metabolic Panel:  Recent Labs Lab 07/21/14 1621  07/23/14 0230 07/24/14 0350 07/25/14 0325 07/26/14 0335 07/28/14 0320  NA 138  < > 133* 131* 132* 132* 130*  K 3.8  < > 3.9 3.8 3.5* 4.3 4.1  CL 100  < > 99 96 97 97 93*  CO2 19  < > 19 21 25 23 26   GLUCOSE 238*  < > 217* 235* 192* 204* 110*  BUN 19  < > 10 8 7 8 12   CREATININE 0.70  < > 0.54 0.55 0.64 0.52 0.63  CALCIUM 9.1  < > 8.4 8.2* 8.1* 8.3* 8.1*  MG 2.1  --   --   --   --   --  1.8  < > = values in this interval not displayed.   Liver Function Tests:  Recent Labs Lab 07/23/14 0230 07/28/14 0320  AST 14 17  ALT 10 16  ALKPHOS 219* 139*  BILITOT 0.3 0.4  PROT 6.2 6.1  ALBUMIN 2.5* 2.2*   CBC:  Recent Labs Lab  07/23/14 0230 07/24/14 0350 07/25/14 0325 07/26/14 0335 07/28/14 0320  WBC 20.6* 20.5* 19.4* 18.5* 24.2*  NEUTROABS  --   --   --   --  19.4*  HGB 13.6 12.8* 11.7* 12.2* 11.8*  HCT 39.7 37.5* 34.2* 35.7* 35.1*  MCV 81.5 81.9 81.2 81.1 83.2  PLT 291 277 285 304 360   Cardiac Enzymes:  Recent Labs Lab 07/21/14 1621 07/21/14 1917 07/22/14 0248  TROPONINI <0.30 <0.30 <0.30   CBG:  Recent Labs Lab 07/27/14 2051 07/28/14 0059 07/28/14 0333 07/28/14 0811 07/28/14 1228  GLUCAP 283* 187* 101* 103* 208*    Recent Results (from the past 240 hour(s))  Culture, blood (routine x 2)     Status: None   Collection Time: 07/21/14  4:26 PM  Result Value Ref Range Status   Specimen Description BLOOD RIGHT HAND  Final   Special Requests BOTTLES DRAWN AEROBIC AND ANAEROBIC 5CC  Final   Culture  Setup Time  Final    07/21/2014 22:18 Performed at [place]   Final    NO GROWTH 5 DAYS Performed at [facility]    Report Status 07/27/2014 FINAL  Final  Culture, blood (routine x 2)     Status: None   Collection Time: 07/21/14  4:32 PM  Result Value Ref Range Status   Specimen Description BLOOD LEFT ARM  Final   Special Requests BOTTLES DRAWN AEROBIC AND ANAEROBIC 10CC  Final   Culture  Setup Time   Final    07/21/2014 22:17 Performed at [place]   Final    NO GROWTH 5 DAYS Performed at [facility]    Report Status 07/27/2014 FINAL  Final  MRSA PCR Screening     Status: None   Collection Time: 07/21/14  6:41 PM  Result Value Ref Range Status   MRSA by PCR NEGATIVE NEGATIVE Final    Comment:        The GeneXpert MRSA Assay (FDA approved for NASAL specimens only), is one component of a comprehensive MRSA colonization surveillance program. It is not intended to diagnose MRSA infection nor to guide or monitor treatment for MRSA infections.   Wound culture     Status: None (Preliminary result)   Collection  Time: 07/27/14  9:13 AM  Result Value Ref Range Status   Specimen Description WOUND BUTTOCKS  Final   Special Requests Normal  Final   Gram Stain   Final    FEW WBC PRESENT,BOTH PMN AND MONONUCLEAR NO SQUAMOUS EPITHELIAL CELLS SEEN FEW GRAM NEGATIVE RODS Performed at [facility]    Culture   Final    MODERATE GRAM NEGATIVE RODS Performed at [facility]    Report Status PENDING  Incomplete     Studies:  Recent x-ray studies have been reviewed in detail by the Attending Physician  Time spent :  35 mins  Erin Hearing, ANP [facility] Office  [phone] Pager [phone]  On-Call/Text Page:      [email or web address]      password [email or web address]  If 7PM-7AM, please contact night-coverage [email or web address] Password [email or web address] 07/28/2014, 1:18 PM   LOS: 7 days   I have personally examined this patient and reviewed the entire database. I have reviewed the above note, made any necessary editorial changes, and agree with its content.  Cherene Altes, MD [facility]

## 2014-07-28 NOTE — Progress Notes (Signed)
MD notified about pt sustained heart rate at 120-130. New orders acknowledged. Will continue to monitor.   Hart Rochester, RN, BSN

## 2014-07-29 ENCOUNTER — Inpatient Hospital Stay (HOSPITAL_COMMUNITY): Payer: Medicare HMO | Admitting: Certified Registered Nurse Anesthetist

## 2014-07-29 ENCOUNTER — Encounter (HOSPITAL_COMMUNITY): Payer: Self-pay | Admitting: Certified Registered Nurse Anesthetist

## 2014-07-29 ENCOUNTER — Encounter (HOSPITAL_COMMUNITY)
Admission: EM | Disposition: A | Discharge status: Home Health | Payer: Self-pay | Source: Home / Self Care | Attending: Internal Medicine

## 2014-07-29 DIAGNOSIS — K648 Other hemorrhoids: Secondary | ICD-10-CM | POA: Insufficient documentation

## 2014-07-29 DIAGNOSIS — K611 Rectal abscess: Secondary | ICD-10-CM | POA: Insufficient documentation

## 2014-07-29 DIAGNOSIS — Z9114 Patient's other noncompliance with medication regimen: Secondary | ICD-10-CM

## 2014-07-29 DIAGNOSIS — E1110 Type 2 diabetes mellitus with ketoacidosis without coma: Secondary | ICD-10-CM | POA: Insufficient documentation

## 2014-07-29 DIAGNOSIS — I513 Intracardiac thrombosis, not elsewhere classified: Secondary | ICD-10-CM | POA: Insufficient documentation

## 2014-07-29 HISTORY — PX: AMPUTATION: SHX166

## 2014-07-29 LAB — CBC
HEMATOCRIT: 34.9 % — AB (ref 39.0–52.0)
Hemoglobin: 11.8 g/dL — ABNORMAL LOW (ref 13.0–17.0)
MCH: 27.7 pg (ref 26.0–34.0)
MCHC: 33.8 g/dL (ref 30.0–36.0)
MCV: 81.9 fL (ref 78.0–100.0)
Platelets: 344 10*3/uL (ref 150–400)
RBC: 4.26 MIL/uL (ref 4.22–5.81)
RDW: 12.9 % (ref 11.5–15.5)
WBC: 23.3 10*3/uL — AB (ref 4.0–10.5)

## 2014-07-29 LAB — COMPREHENSIVE METABOLIC PANEL
ALT: 13 U/L (ref 0–53)
ANION GAP: 12 (ref 5–15)
AST: 14 U/L (ref 0–37)
Albumin: 2.1 g/dL — ABNORMAL LOW (ref 3.5–5.2)
Alkaline Phosphatase: 136 U/L — ABNORMAL HIGH (ref 39–117)
BILIRUBIN TOTAL: 0.4 mg/dL (ref 0.3–1.2)
BUN: 10 mg/dL (ref 6–23)
CALCIUM: 7.9 mg/dL — AB (ref 8.4–10.5)
CHLORIDE: 94 meq/L — AB (ref 96–112)
CO2: 25 meq/L (ref 19–32)
CREATININE: 0.58 mg/dL (ref 0.50–1.35)
GFR calc Af Amer: >90 mL/min (ref >90)
Glucose, Bld: 209 mg/dL — ABNORMAL HIGH (ref 70–99)
Potassium: 4.1 mEq/L (ref 3.7–5.3)
Sodium: 131 mEq/L — ABNORMAL LOW (ref 137–147)
Total Protein: 5.8 g/dL — ABNORMAL LOW (ref 6.0–8.3)

## 2014-07-29 LAB — URINE CULTURE: Colony Count: >=100000

## 2014-07-29 LAB — GLUCOSE, CAPILLARY
GLUCOSE-CAPILLARY: 181 mg/dL — AB (ref 70–99)
Glucose-Capillary: 139 mg/dL — ABNORMAL HIGH (ref 70–99)
Glucose-Capillary: 143 mg/dL — ABNORMAL HIGH (ref 70–99)
Glucose-Capillary: 154 mg/dL — ABNORMAL HIGH (ref 70–99)
Glucose-Capillary: 192 mg/dL — ABNORMAL HIGH (ref 70–99)
Glucose-Capillary: 237 mg/dL — ABNORMAL HIGH (ref 70–99)

## 2014-07-29 LAB — WOUND CULTURE: SPECIAL REQUESTS: NORMAL

## 2014-07-29 SURGERY — AMPUTATION DIGIT
Anesthesia: Monitor Anesthesia Care | Site: Hand | Laterality: Right

## 2014-07-29 MED ORDER — PROPOFOL 10 MG/ML IV BOLUS
INTRAVENOUS | Status: AC
  Filled 2014-07-29: qty 20

## 2014-07-29 MED ORDER — LACTATED RINGERS IV SOLN
INTRAVENOUS | Status: DC | PRN
  Administered 2014-07-29: 14:00:00 via INTRAVENOUS

## 2014-07-29 MED ORDER — LIDOCAINE-EPINEPHRINE 1 %-1:100000 IJ SOLN
INTRAMUSCULAR | Status: AC
  Filled 2014-07-29: qty 1

## 2014-07-29 MED ORDER — FENTANYL CITRATE 0.05 MG/ML IJ SOLN
INTRAMUSCULAR | Status: AC
  Filled 2014-07-29: qty 5

## 2014-07-29 MED ORDER — INSULIN STARTER KIT- PEN NEEDLES (ENGLISH): 1.0000 | Freq: Once | Status: DC

## 2014-07-29 MED ORDER — MIDAZOLAM HCL 2 MG/2ML IJ SOLN
INTRAMUSCULAR | Status: AC
  Filled 2014-07-29: qty 2

## 2014-07-29 MED ORDER — METOPROLOL TARTRATE 1 MG/ML IV SOLN
INTRAVENOUS | Status: AC
Start: 2014-07-29 — End: 2014-07-30
  Filled 2014-07-29: qty 5

## 2014-07-29 MED ORDER — LIDOCAINE-EPINEPHRINE 2 %-1:100000 IJ SOLN
INTRAMUSCULAR | Status: AC
  Filled 2014-07-29: qty 1

## 2014-07-29 MED ORDER — SODIUM BICARBONATE 4 % IV SOLN
INTRAVENOUS | Status: AC
  Filled 2014-07-29: qty 5

## 2014-07-29 MED ORDER — METOPROLOL TARTRATE 1 MG/ML IV SOLN
5.0000 mg | INTRAVENOUS | Status: AC | PRN
  Administered 2014-07-29: 3 mg via INTRAVENOUS
  Administered 2014-07-29: 2 mg via INTRAVENOUS

## 2014-07-29 MED ORDER — LACTATED RINGERS IV SOLN
INTRAVENOUS | Status: DC
  Administered 2014-07-29: 14:00:00 via INTRAVENOUS

## 2014-07-29 MED ORDER — 0.9 % SODIUM CHLORIDE (POUR BTL) OPTIME
TOPICAL | Status: DC | PRN
  Administered 2014-07-29: 1000 mL

## 2014-07-29 SURGICAL SUPPLY — 25 items
BNDG CMPR 9X4 STRL LF SNTH (GAUZE/BANDAGES/DRESSINGS) ×1
BNDG COHESIVE 1X5 TAN STRL LF (GAUZE/BANDAGES/DRESSINGS) ×3 IMPLANT
BNDG ESMARK 4X9 LF (GAUZE/BANDAGES/DRESSINGS) ×3 IMPLANT
CORDS BIPOLAR (ELECTRODE) ×3 IMPLANT
COVER SURGICAL LIGHT HANDLE (MISCELLANEOUS) ×3 IMPLANT
DRAPE SURG 17X23 STRL (DRAPES) ×3 IMPLANT
GAUZE SPONGE 4X4 12PLY STRL (GAUZE/BANDAGES/DRESSINGS) ×3 IMPLANT
GAUZE XEROFORM 1X8 LF (GAUZE/BANDAGES/DRESSINGS) ×3 IMPLANT
GLOVE BIO SURGEON STRL SZ8 (GLOVE) ×3 IMPLANT
GLOVE SKINSENSE NS SZ6.5 (GLOVE) ×2
GLOVE SKINSENSE STRL SZ6.5 (GLOVE) ×1 IMPLANT
GOWN STRL REUS W/ TWL LRG LVL3 (GOWN DISPOSABLE) ×1 IMPLANT
GOWN STRL REUS W/ TWL XL LVL3 (GOWN DISPOSABLE) ×1 IMPLANT
GOWN STRL REUS W/TWL LRG LVL3 (GOWN DISPOSABLE) ×3
GOWN STRL REUS W/TWL XL LVL3 (GOWN DISPOSABLE) ×3
KIT BASIN OR (CUSTOM PROCEDURE TRAY) ×3 IMPLANT
KIT ROOM TURNOVER OR (KITS) ×3 IMPLANT
NS IRRIG 1000ML POUR BTL (IV SOLUTION) ×3 IMPLANT
PACK ORTHO EXTREMITY (CUSTOM PROCEDURE TRAY) ×3 IMPLANT
SPECIMEN JAR SMALL (MISCELLANEOUS) ×3 IMPLANT
SUCTION FRAZIER TIP 10 FR DISP (SUCTIONS) ×3 IMPLANT
SUT VICRYL RAPIDE 4/0 PS 2 (SUTURE) ×6 IMPLANT
TOWEL OR 17X24 6PK STRL BLUE (TOWEL DISPOSABLE) ×3 IMPLANT
TOWEL OR 17X26 10 PK STRL BLUE (TOWEL DISPOSABLE) ×3 IMPLANT
UNDERPAD 30X30 INCONTINENT (UNDERPADS AND DIAPERS) ×3 IMPLANT

## 2014-07-29 NOTE — Transfer of Care (Signed)
Immediate Anesthesia Transfer of Care Note  Patient: Thomas Wright  Procedure(s) Performed: Procedure(s): AMPUTATION RIGHT LONG FINGER (Right)  Patient Location: Nursing Unit  Anesthesia Type:MAC  Level of Consciousness: awake, alert  and oriented  Airway & Oxygen Therapy: Patient Spontanous Breathing  Post-op Assessment: Report given to PACU RN  Post vital signs: Reviewed and stable  Complications: No apparent anesthesia complications

## 2014-07-29 NOTE — Anesthesia Postprocedure Evaluation (Signed)
  Anesthesia Post-op Note  Patient: Thomas Wright  Procedure(s) Performed: Procedure(s): AMPUTATION RIGHT LONG FINGER (Right)  Patient Location: Nursing Unit  Anesthesia Type:MAC  Level of Consciousness: awake, alert  and oriented  Airway and Oxygen Therapy: Patient Spontanous Breathing  Post-op Pain: none  Post-op Assessment: Post-op Vital signs reviewed, Patient's Cardiovascular Status Stable and Respiratory Function Stable  Post-op Vital Signs: Reviewed and stable  Last Vitals:  Filed Vitals:   07/29/14 1425  BP: 143/68  Pulse:   Temp:   Resp: 24    Complications: No apparent anesthesia complications

## 2014-07-29 NOTE — Op Note (Signed)
See note [id number]

## 2014-07-29 NOTE — Anesthesia Procedure Notes (Addendum)
Procedure Name: MAC Date/Time: 07/29/2014 2:45 PM Performed by: Barrington Ellison Pre-anesthesia Checklist: Patient identified, Emergency Drugs available, Suction available and Patient being monitored Patient Re-evaluated:Patient Re-evaluated prior to inductionOxygen Delivery Method: Nasal cannula

## 2014-07-29 NOTE — Progress Notes (Signed)
LOS: 8 days   Subjective: HD #8. No complaints during my exam.   Objective: Vital signs in last 24 hours: afebrile, no tachycardia, normotensive. Temp:  [98.1 F (36.7 C)-99 F (37.2 C)] 98.1 F (36.7 C) (11/12 0742) Pulse Rate:  [42-122] 63 (11/12 0742) Resp:  [16-27] 21 (11/12 0742) BP: (96-122)/(53-78) 122/53 mmHg (11/12 0742) SpO2:  [92 %-100 %] 98 % (11/12 0742) Weight:  [104.5 kg (230 lb 6.1 oz)] 104.5 kg (230 lb 6.1 oz) (11/12 0541) Last BM Date: 07/28/14   Laboratory  CBC  Recent Labs  07/28/14 0320 07/29/14 0320  WBC 24.2* 23.3*  HGB 11.8* 11.8*  HCT 35.1* 34.9*  PLT 360 344   BMET  Recent Labs  07/28/14 0320 07/29/14 0320  NA 130* 131*  K 4.1 4.1  CL 93* 94*  CO2 26 25  GLUCOSE 110* 209*  BUN 12 10  CREATININE 0.63 0.58  CALCIUM 8.1* 7.9*     Physical Exam General appearance: alert, cooperative and no distress Incision/Wound: left medial buttock perirectal abscess improving. Minimal tenderness and induration, skin open approximately 1 cm and spontaneously draining small amount of red/brown purulent material.    Assessment/Plan: 1. Buttock abscess 2. Leukocytosis (WBC today 23.3)  Plan: 1. Patient does not need surgical intervention with regard to perirectal abscess. buttock abscess with adequate spontaneous drainage. 2. Leukocytosis: improved slightly today. Afebrile. Doubtful to be fully related to buttock. Would recommend changing Unasyn to Cipro or changing abx regimen to Zosyn. Unasyn is not adequate coverage as e coli and other bacteria are resistant to this now. 3. Will continue to follow. Cont sitz baths and dry dressing changes.  4. Gen surg to sign off on case today. Please feel free to re-consult with any concerns.   LOS: 8 days     Lahoma Rocker, PA-C [facility] Pager [phone] 07/29/2014

## 2014-07-29 NOTE — Progress Notes (Signed)
Patient ID: Thomas Wright, male   DOB: 1946-06-14, 68 y.o.   MRN: [id number]      Subjective: No cardiac complaints  Discussed rectal abscess with Dr Redmond Pulling conservative Rx ? Leukocytosis from urine   Objective: Vital signs in last 24 hours: Temp:  [98.1 F (36.7 C)-99 F (37.2 C)] 98.1 F (36.7 C) (11/12 0742) Pulse Rate:  [42-122] 63 (11/12 0742) Resp:  [16-27] 21 (11/12 0742) BP: (96-122)/(53-78) 122/53 mmHg (11/12 0742) SpO2:  [92 %-100 %] 98 % (11/12 0742) Weight:  [104.5 kg (230 lb 6.1 oz)] 104.5 kg (230 lb 6.1 oz) (11/12 0541) Last BM Date: 07/28/14  Intake/Output from previous day: 11/11 0701 - 11/12 0700 In: 496.8 [P.O.:240; I.V.:106.8; IV Piggyback:150] Out: 2043 [Urine:2040; Stool:3] Intake/Output this shift: Total I/O In: -  Out: 600 [Urine:600]  Medications . amiodarone  200 mg Oral BID  . antiseptic oral rinse  7 mL Mouth Rinse BID  . aspirin EC  81 mg Oral Daily  . atorvastatin  10 mg Oral q morning - 10a  . carvedilol  37.5 mg Oral BID WC  .  ceFAZolin (ANCEF) IV  2 g Intravenous On Call to OR  . chlorhexidine  60 mL Topical Once  . docusate sodium  100 mg Oral BID  . hydrocortisone  25 mg Rectal BID  . insulin aspart  0-20 Units Subcutaneous TID WC  . insulin aspart  3 Units Subcutaneous TID WC  . insulin glargine  34 Units Subcutaneous QHS  . piperacillin-tazobactam (ZOSYN)  IV  3.375 g Intravenous Q8H  . polyethylene glycol  17 g Oral Daily   . sodium chloride 10 mL/hr at 07/28/14 1819   PE: Affect appropriate Chronically ill white male  HEENT: normal Neck supple with no adenopathy JVP normal no bruits no thyromegaly Lungs clear with no wheezing and good diaphragmatic motion Heart:  S1/S2 SEM  murmur, no rub, gallop or click PMI normal Abdomen: benighn, BS positve, no tenderness, no AAA no bruit.  No HSM or HJR Distal pulses intact with no bruits No edema Neuro non-focal Dressing on right middle finger  No muscular weakness Nurse  indicates drainage from rectal area    Lab Results:   Recent Labs  07/28/14 0320 07/29/14 0320  WBC 24.2* 23.3*  HGB 11.8* 11.8*  HCT 35.1* 34.9*  PLT 360 344   BMET  Recent Labs  07/28/14 0320 07/29/14 0320  NA 130* 131*  K 4.1 4.1  CL 93* 94*  CO2 26 25  GLUCOSE 110* 209*  BUN 12 10  CREATININE 0.63 0.58  CALCIUM 8.1* 7.9*    Assessment/Plan  68 year old male with paroxysmal atrial fibrillation, stopped amiodarone in 2013 after no further episodes, with prior episode of postoperative atrial fibrillation following bioprosthetic aortic valve replacement in 2008 here with atypical chest discomfort, fatigue and was found to be in atrial fibrillation with RVR and in diabetic ketoacidosis.  1. Parox a-fib with RVR - we will increase carvedilol to 25 mg po bid   TEE with LAA thrombus  Will need to hold eliquis for amputation of finger 11/12?  Have left message with Dr Bertis Ruddy nurse to clarify wether this needs to be held  2. H/O aortic valve replacement with tissue graft normal gradient and no SBE on TEE on 07/23/2014   3. Cardiomyopathy     Appears euvolemic   4. Diabetic ketoacidosis - resolved, managed by hospitalist service, started on Lantus, will need to be discharged on it  5. Tenosynovitis of finger  Dr Burney Gauze has seen and plans bedside amputation  Holding eliquis until he clarifies 6.  Skin care consult per nurse for rectal drainage  Patient has rectal abscess  Draining Dr Redmond Pulling has seen   Please restart Eliquis after amputation of finger by [facility].  Please see notes regarding increasing lantus and meal coverage insulin Cardiac status stable No DCC secondary to LAA thrombus  Continue amiodarone     LOS: 8 days    Jenkins Rouge

## 2014-07-29 NOTE — Plan of Care (Signed)
Problem: Phase I Progression Outcomes Goal: Pain controlled with appropriate interventions Outcome: Completed/Met Date Met:  07/29/14 Goal: OOB as tolerated unless otherwise ordered Outcome: Completed/Met Date Met:  07/29/14 Goal: Voiding-avoid urinary catheter unless indicated Outcome: Completed/Met Date Met:  07/29/14

## 2014-07-29 NOTE — Progress Notes (Signed)
Patient underwent right long finger P-2 level amputation under local with no complications  Will need to see in my office Monday next week for dressing change or at bedside if still inpatient

## 2014-07-29 NOTE — H&P (Signed)
JODECI ROARTY is an 68 y.o. male.   Chief Complaint: right long finger chronic wound HPI: as above s/p I and D extensor sheath in past  Past Medical History  Diagnosis Date  . Diabetes mellitus   . Hypertension   . Atrial fibrillation   . Hypertriglyceridemia   . Afib     Stopped amiodarone 2013-no further episodes of atrial fibrillation. Postoperative 2008  . H/O aortic valve replacement with tissue graft     2008.  . Back pain   . Cardiomyopathy     EF originally 20%-now 45%, no CAD on cath    Past Surgical History  Procedure Laterality Date  . Cardiac surgery  05/2007    Aortic Valve Replacement  . I&d extremity Right 05/05/2014    Procedure: IRRIGATION AND DEBRIDEMENT EXTREMITY;  Surgeon: [name], MD;  Location: [facility];  Service: Orthopedics;  Laterality: Right;  . Tee without cardioversion N/A 07/23/2014    Procedure: TRANSESOPHAGEAL ECHOCARDIOGRAM (TEE);  Surgeon: Josue Hector, MD;  Location: [facility] ENDOSCOPY;  Service: Cardiovascular;  Laterality: N/A;  . Cardioversion N/A 07/23/2014    Procedure: CARDIOVERSION;  Surgeon: Josue Hector, MD;  Location: [facility] ENDOSCOPY;  Service: Cardiovascular;  Laterality: N/A;    Family History  Problem Relation Age of Onset  . Colon cancer Father   . Liver disease Brother   . Heart murmur Child   . Congenital heart disease Other    Social History:  reports that he has never smoked. He has never used smokeless tobacco. He reports that he does not drink alcohol or use illicit drugs.  Allergies: No Known Allergies  Medications Prior to Admission  Medication Sig Dispense Refill  . aspirin 81 MG tablet Take 81 mg by mouth daily.    Marland Kitchen atorvastatin (LIPITOR) 10 MG tablet Take 10 mg by mouth every morning.    . carvedilol (COREG) 25 MG tablet Take 1 tablet (25 mg total) by mouth 2 (two) times daily with a meal. 60 tablet 6  . doxycycline (VIBRAMYCIN) 100 MG capsule Take 100 mg by mouth once.    . furosemide (LASIX) 20 MG tablet Take 1  tablet (20 mg total) by mouth daily. 90 tablet 2  . glipiZIDE (GLUCOTROL) 5 MG tablet Take 1 tablet (5 mg total) by mouth daily before breakfast. 90 tablet 0  . HYDROcodone-acetaminophen (NORCO/VICODIN) 5-325 MG per tablet Take 1-2 tablets by mouth every 4 (four) hours as needed for moderate pain. 30 tablet 0  . losartan (COZAAR) 50 MG tablet Take 1 tablet (50 mg total) by mouth daily. 90 tablet 2  . metFORMIN (GLUCOPHAGE) 1000 MG tablet Take 1,000 mg by mouth 2 (two) times daily with a meal.      Results for orders placed or performed during the hospital encounter of 07/21/14 (from the past 48 hour(s))  Glucose, capillary     Status: Abnormal   Collection Time: 07/27/14  4:24 PM  Result Value Ref Range   Glucose-Capillary 342 (H) 70 - 99 mg/dL  Glucose, capillary     Status: Abnormal   Collection Time: 07/27/14  8:39 PM  Result Value Ref Range   Glucose-Capillary 315 (H) 70 - 99 mg/dL  Glucose, capillary     Status: Abnormal   Collection Time: 07/27/14  8:51 PM  Result Value Ref Range   Glucose-Capillary 283 (H) 70 - 99 mg/dL  Glucose, capillary     Status: Abnormal   Collection Time: 07/28/14 12:59 AM  Result Value Ref  Range   Glucose-Capillary 187 (H) 70 - 99 mg/dL  Comprehensive metabolic panel     Status: Abnormal   Collection Time: 07/28/14  3:20 AM  Result Value Ref Range   Sodium 130 (L) 137 - 147 mEq/L   Potassium 4.1 3.7 - 5.3 mEq/L   Chloride 93 (L) 96 - 112 mEq/L   CO2 26 19 - 32 mEq/L   Glucose, Bld 110 (H) 70 - 99 mg/dL   BUN 12 6 - 23 mg/dL   Creatinine, Ser 0.63 0.50 - 1.35 mg/dL   Calcium 8.1 (L) 8.4 - 10.5 mg/dL   Total Protein 6.1 6.0 - 8.3 g/dL   Albumin 2.2 (L) 3.5 - 5.2 g/dL   AST 17 0 - 37 U/L   ALT 16 0 - 53 U/L   Alkaline Phosphatase 139 (H) 39 - 117 U/L   Total Bilirubin 0.4 0.3 - 1.2 mg/dL   GFR calc non Af Amer >90 >90 mL/min   GFR calc Af Amer >90 >90 mL/min    Comment: (NOTE) The eGFR has been calculated using the CKD EPI equation. This  calculation has not been validated in all clinical situations. eGFR's persistently <90 mL/min signify possible Chronic Kidney Disease.    Anion gap 11 5 - 15  CBC with Differential     Status: Abnormal   Collection Time: 07/28/14  3:20 AM  Result Value Ref Range   WBC 24.2 (H) 4.0 - 10.5 K/uL   RBC 4.22 4.22 - 5.81 MIL/uL   Hemoglobin 11.8 (L) 13.0 - 17.0 g/dL   HCT 35.1 (L) 39.0 - 52.0 %   MCV 83.2 78.0 - 100.0 fL   MCH 28.0 26.0 - 34.0 pg   MCHC 33.6 30.0 - 36.0 g/dL   RDW 13.0 11.5 - 15.5 %   Platelets 360 150 - 400 K/uL   Neutrophils Relative % 80 (H) 43 - 77 %   Lymphocytes Relative 9 (L) 12 - 46 %   Monocytes Relative 10 3 - 12 %   Eosinophils Relative 1 0 - 5 %   Basophils Relative 0 0 - 1 %   Neutro Abs 19.4 (H) 1.7 - 7.7 K/uL   Lymphs Abs 2.2 0.7 - 4.0 K/uL   Monocytes Absolute 2.4 (H) 0.1 - 1.0 K/uL   Eosinophils Absolute 0.2 0.0 - 0.7 K/uL   Basophils Absolute 0.0 0.0 - 0.1 K/uL   RBC Morphology POLYCHROMASIA PRESENT    WBC Morphology INCREASED BANDS (>20% BANDS)     Comment: TOXIC GRANULATION  Magnesium     Status: None   Collection Time: 07/28/14  3:20 AM  Result Value Ref Range   Magnesium 1.8 1.5 - 2.5 mg/dL  Protime-INR     Status: Abnormal   Collection Time: 07/28/14  3:20 AM  Result Value Ref Range   Prothrombin Time 19.0 (H) 11.6 - 15.2 seconds   INR 1.58 (H) 0.00 - 1.49  Glucose, capillary     Status: Abnormal   Collection Time: 07/28/14  3:33 AM  Result Value Ref Range   Glucose-Capillary 101 (H) 70 - 99 mg/dL  Glucose, capillary     Status: Abnormal   Collection Time: 07/28/14  8:11 AM  Result Value Ref Range   Glucose-Capillary 103 (H) 70 - 99 mg/dL   Comment 1 Documented in Chart   Glucose, capillary     Status: Abnormal   Collection Time: 07/28/14 12:28 PM  Result Value Ref Range   Glucose-Capillary 208 (H) 70 -  99 mg/dL   Comment 1 Documented in Chart   Glucose, capillary     Status: Abnormal   Collection Time: 07/28/14  3:41 PM  Result  Value Ref Range   Glucose-Capillary 203 (H) 70 - 99 mg/dL  Urinalysis, Routine w reflex microscopic     Status: Abnormal   Collection Time: 07/28/14  5:39 PM  Result Value Ref Range   Color, Urine YELLOW YELLOW   APPearance CLOUDY (A) CLEAR   Specific Gravity, Urine 1.012 1.005 - 1.030   pH 5.5 5.0 - 8.0   Glucose, UA 500 (A) NEGATIVE mg/dL   Hgb urine dipstick MODERATE (A) NEGATIVE   Bilirubin Urine NEGATIVE NEGATIVE   Ketones, ur NEGATIVE NEGATIVE mg/dL   Protein, ur NEGATIVE NEGATIVE mg/dL   Urobilinogen, UA 0.2 0.0 - 1.0 mg/dL   Nitrite NEGATIVE NEGATIVE   Leukocytes, UA MODERATE (A) NEGATIVE  Urine microscopic-add on     Status: None   Collection Time: 07/28/14  5:39 PM  Result Value Ref Range   WBC, UA 11-20 <3 WBC/hpf   RBC / HPF 3-6 <3 RBC/hpf   Urine-Other MANY YEAST   Glucose, capillary     Status: Abnormal   Collection Time: 07/28/14  8:42 PM  Result Value Ref Range   Glucose-Capillary 256 (H) 70 - 99 mg/dL  Glucose, capillary     Status: Abnormal   Collection Time: 07/29/14 12:22 AM  Result Value Ref Range   Glucose-Capillary 237 (H) 70 - 99 mg/dL  CBC     Status: Abnormal   Collection Time: 07/29/14  3:20 AM  Result Value Ref Range   WBC 23.3 (H) 4.0 - 10.5 K/uL   RBC 4.26 4.22 - 5.81 MIL/uL   Hemoglobin 11.8 (L) 13.0 - 17.0 g/dL   HCT 34.9 (L) 39.0 - 52.0 %   MCV 81.9 78.0 - 100.0 fL   MCH 27.7 26.0 - 34.0 pg   MCHC 33.8 30.0 - 36.0 g/dL   RDW 12.9 11.5 - 15.5 %   Platelets 344 150 - 400 K/uL  Comprehensive metabolic panel     Status: Abnormal   Collection Time: 07/29/14  3:20 AM  Result Value Ref Range   Sodium 131 (L) 137 - 147 mEq/L   Potassium 4.1 3.7 - 5.3 mEq/L   Chloride 94 (L) 96 - 112 mEq/L   CO2 25 19 - 32 mEq/L   Glucose, Bld 209 (H) 70 - 99 mg/dL   BUN 10 6 - 23 mg/dL   Creatinine, Ser 0.58 0.50 - 1.35 mg/dL   Calcium 7.9 (L) 8.4 - 10.5 mg/dL   Total Protein 5.8 (L) 6.0 - 8.3 g/dL   Albumin 2.1 (L) 3.5 - 5.2 g/dL   AST 14 0 - 37 U/L    ALT 13 0 - 53 U/L   Alkaline Phosphatase 136 (H) 39 - 117 U/L   Total Bilirubin 0.4 0.3 - 1.2 mg/dL   GFR calc non Af Amer >90 >90 mL/min   GFR calc Af Amer >90 >90 mL/min    Comment: (NOTE) The eGFR has been calculated using the CKD EPI equation. This calculation has not been validated in all clinical situations. eGFR's persistently <90 mL/min signify possible Chronic Kidney Disease.    Anion gap 12 5 - 15  Glucose, capillary     Status: Abnormal   Collection Time: 07/29/14  4:54 AM  Result Value Ref Range   Glucose-Capillary 192 (H) 70 - 99 mg/dL  Glucose, capillary  Status: Abnormal   Collection Time: 07/29/14  7:47 AM  Result Value Ref Range   Glucose-Capillary 181 (H) 70 - 99 mg/dL  Glucose, capillary     Status: Abnormal   Collection Time: 07/29/14 11:17 AM  Result Value Ref Range   Glucose-Capillary 143 (H) 70 - 99 mg/dL  Glucose, capillary     Status: Abnormal   Collection Time: 07/29/14  2:05 PM  Result Value Ref Range   Glucose-Capillary 139 (H) 70 - 99 mg/dL   No results found.  Review of Systems  All other systems reviewed and are negative.   Blood pressure 106/56, pulse 86, temperature 98.5 F (36.9 C), temperature source Oral, resp. rate 14, height 6' (1.829 m), weight 104.5 kg (230 lb 6.1 oz), SpO2 98 %. Physical Exam  Constitutional: He is oriented to person, place, and time. He appears well-developed and well-nourished.  HENT:  Head: Normocephalic and atraumatic.  Cardiovascular: Normal rate.   Respiratory: Effort normal.  Musculoskeletal:       Right hand: He exhibits tenderness, deformity and swelling.  Right long finger chronic dorsal wound with loss of extensor function  Neurological: He is alert and oriented to person, place, and time.  Skin: Skin is warm. There is erythema.  Psychiatric: He has a normal mood and affect. His behavior is normal. Judgment and thought content normal.     Assessment/Plan As above  Plan I and D and revision  amputation under local block  Harland Aguiniga A 07/29/2014, 2:26 PM

## 2014-07-29 NOTE — Plan of Care (Signed)
Problem: Phase I Progression Outcomes Goal: Initial discharge plan identified Outcome: Progressing Goal: Hemodynamically stable Outcome: Progressing

## 2014-07-29 NOTE — Anesthesia Preprocedure Evaluation (Signed)
Anesthesia Evaluation  Patient identified by MRN, date of birth, ID band Patient awake    Reviewed: Allergy & Precautions, H&P , NPO status , Patient's Chart, lab work & pertinent test results, Unable to perform ROS - Chart review only  Airway Mallampati: II  TM Distance: >3 FB Neck ROM: Full    Dental  (+) Teeth Intact   Pulmonary  breath sounds clear to auscultation        Cardiovascular hypertension, Rhythm:Irregular Rate:Tachycardia     Neuro/Psych    GI/Hepatic   Endo/Other  diabetes  Renal/GU      Musculoskeletal   Abdominal   Peds  Hematology   Anesthesia Other Findings   Reproductive/Obstetrics                             Anesthesia Physical Anesthesia Plan  ASA: III  Anesthesia Plan: MAC   Post-op Pain Management:    Induction: Intravenous  Airway Management Planned: Natural Airway and Simple Face Mask  Additional Equipment:   Intra-op Plan:   Post-operative Plan:   Informed Consent: I have reviewed the patients History and Physical, chart, labs and discussed the procedure including the risks, benefits and alternatives for the proposed anesthesia with the patient or authorized representative who has indicated his/her understanding and acceptance.   Dental advisory given  Plan Discussed with: CRNA and Anesthesiologist  Anesthesia Plan Comments: (Tenosynovitis R. 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  Roberts Gaudy, MD)        Anesthesia Quick Evaluation

## 2014-07-29 NOTE — Progress Notes (Signed)
Physical Therapy Treatment Patient Details Name: Thomas Wright MRN: [id number] DOB: 1946-03-16 Today's Date: 07/29/2014    History of Present Illness 68 year old male with paroxysmal atrial fibrillation, stopped amiodarone in 2013 after no further episodes, with prior episode of postoperative atrial fibrillation following bioprosthetic aortic valve replacement in 2008 here with atypical chest discomfort, fatigue and was found to be in atrial fibrillation with RVR and in diabetic ketoacidosis    PT Comments    Patient progressing with ambulation, tolerated increased distance.  Patient also tolerated generalized mobility and there ex in room as well as dynamic standing and functional activities. Will continue to see and progress as tolerated.  Follow Up Recommendations  Home health PT;Supervision/Assistance - 24 hour (pending progress)     Equipment Recommendations  Rolling walker with 5" wheels    Recommendations for Other Services       Precautions / Restrictions Precautions Precautions: Fall Restrictions Weight Bearing Restrictions: No    Mobility  Bed Mobility Overal bed mobility: Needs Assistance Bed Mobility: Rolling;Supine to Sit Rolling: Min guard   Supine to sit: Min assist     General bed mobility comments: assist to elevate to upright position  Transfers Overall transfer level: Needs assistance Equipment used: Rolling walker (2 wheeled) Transfers: Sit to/from [facility] Sit to Stand: Min assist Stand pivot transfers: Min assist       General transfer comment: VCs for hand placement, assist for stability, patient performed transfer from bed as well as to/from bedside commode  Ambulation/Gait Ambulation/Gait assistance: Min guard Ambulation Distance (Feet): 180 Feet Assistive device: Rolling walker (2 wheeled) Gait Pattern/deviations: Step-through pattern;Decreased stride length;Drifts right/left;Trunk flexed;Narrow base of support Gait  velocity: decreased (increased lateral sway) Gait velocity interpretation: Below normal speed for age/gender General Gait Details: VCs for positioning within Rw and upright posture, cues for safety.  multiple rest breaks during ambualtion.    Stairs            Wheelchair Mobility    Modified Rankin (Stroke Patients Only)       Balance     Sitting balance-Leahy Scale: Good       Standing balance-Leahy Scale: Fair                      Cognition Arousal/Alertness: Awake/alert Behavior During Therapy: WFL for tasks assessed/performed Overall Cognitive Status: Within Functional Limits for tasks assessed                      Exercises      General Comments General comments (skin integrity, edema, etc.): patient ambulated, then assisted with hygiene for buttocks post BM.       Pertinent Vitals/Pain Pain Assessment: 0-10 Pain Score: 2  Pain Location: buttocks Pain Descriptors / Indicators: Discomfort Pain Intervention(s): Repositioned;Limited activity within patient's tolerance;Monitored during session    Home Living                      Prior Function            PT Goals (current goals can now be found in the care plan section) Acute Rehab PT Goals Patient Stated Goal: to go home  PT Goal Formulation: With patient Time For Goal Achievement: 08/09/14 Potential to Achieve Goals: Good Progress towards PT goals: Progressing toward goals    Frequency  Min 3X/week    PT Plan Current plan remains appropriate    Co-evaluation  End of Session Equipment Utilized During Treatment: Gait belt Activity Tolerance: Patient tolerated treatment well;Patient limited by fatigue Patient left: in chair;with call bell/phone within reach     Time: 1112-1139 PT Time Calculation (min) (ACUTE ONLY): 27 min  Charges:  $Gait Training: 8-22 mins $Therapeutic Activity: 8-22 mins                    G CodesDuncan Dull August 27, 2014, 2:51 PM [name], [place] DPT  [phone]

## 2014-07-29 NOTE — Anesthesia Postprocedure Evaluation (Signed)
  Anesthesia Post-op Note  Patient: Thomas Wright  Procedure(s) Performed: Procedure(s): AMPUTATION RIGHT LONG FINGER (Right)  Patient Location: PACU  Anesthesia Type:MAC  Level of Consciousness: awake, alert  and oriented  Airway and Oxygen Therapy: Patient Spontanous Breathing  Post-op Pain: none  Post-op Assessment: Post-op Vital signs reviewed, Patient's Cardiovascular Status Stable, Respiratory Function Stable, Patent Airway, No signs of Nausea or vomiting, Adequate PO intake and Pain level controlled  Post-op Vital Signs: stable  Last Vitals:  Filed Vitals:   07/29/14 1603  BP: 132/90  Pulse: 111  Temp: 36.9 C  Resp: 31    Complications: No apparent anesthesia complications

## 2014-07-29 NOTE — Care Management Note (Signed)
Eliquis COVERED; $45 CO-PAY AT [employer]; NO AUTH REQUIRED

## 2014-07-29 NOTE — Progress Notes (Signed)
Inpatient Diabetes Program Recommendations  AACE/ADA: New Consensus Statement on Inpatient Glycemic Control (2013)  Target Ranges:  Prepandial:   less than 140 mg/dL      Peak postprandial:   less than 180 mg/dL (1-2 hours)      Critically ill patients:  140 - 180 mg/dL   Results for Thomas Wright, Thomas Wright (MRN [id number]) as of 07/29/2014 08:14  Ref. Range 07/28/2014 08:11 07/28/2014 12:28 07/28/2014 15:41 07/28/2014 20:42 07/29/2014 00:22 07/29/2014 04:54  Glucose-Capillary Latest Range: 70-99 mg/dL 103 (H) 208 (H) 203 (H) 256 (H) 237 (H) 192 (H)   Diabetes history: DM2 Outpatient Diabetes medications: Metformin 1000 mg BID, Glipizide 5 mg QAM Current orders for Inpatient glycemic control: Lantus 34 units QHS, Novolog 0-20 units AC, Novolog 3 units TID with meals  Inpatient Diabetes Program Recommendations Insulin - Basal: Please consider increasing Lantus to 36 units QHS. Correction (SSI): Please consider ordering Novolog bedtime correction scale. Insulin - Meal Coverage: Please consider increasing meal coverage to Novolog 5 units TID with meals.  Thanks, Barnie Alderman, RN, MSN, CCRN, CDE Diabetes Coordinator Inpatient Diabetes Program [phone] (Team Pager) [phone] ([place] office) [phone] [facility] office)

## 2014-07-29 NOTE — Progress Notes (Signed)
[name]  Thomas Wright YHC:[id number] DOB: 08/08/1946 DOA: 07/21/2014 PCP: [name], MD   Brief narrative: 68 yo M with PMH of DM, HTN, Afib, h/o bioprosthetic valve replacement in 2008, h/o tenosynovitis and I&D in 8/15 who presented to the ER with generalized malaise, and chest pain, and was found to have Afib with RVR.  Cards admitted him.    He had not been taking his oral hypoglycemics for months - his CBGs had been running in 300s-400s for weeks.  His CBGs have become much better controlled while hospitalized with the use of long-acting insulin. Since plans are to discharge home on insulin Glucotrol was discontinued on 11/9. Pre authorization was completed for Lantus Solostar flexpen and patient was approved; email printed and placed on shadow chart.   Has had leukocytosis since admission. Has been experiencing rectal pain presumed to be hemorrhoidal but developed redness and spontaneous drainage c/w peri rectal abscess. Empiric anbx's started and surgery consulted. Since area spontaneously draining no indications for incision and drainage. Leukocytosis persisted. UA abnormal after foley dc'd and concerning for UTI-cx pending.  HPI/Subjective: Alert and no specific complaints.  Assessment/Plan:  Diabetic ketoacidosis / diabetes mellitus Suspect initial DKA driven by smouldering perirectal abscess  - Hemoglobin A1c > 11 - now on Lantus - will need insulin at discharge  - diabetes educator consulted  - pre authorization for Lantus Flexpen completed 11/6 and email response confirming approval printed and placed in shadow chart  - 11/09 added meal coverage as CBGs were elevated in inpt setting (and will likely be higher after d/c)  - Lantus increased to 34 units daily and SSI increased to resistant on 11/10 and as of 11/12 CBGs much better controlled - needs ACE as soon as BP tolerates (currently marginally low at times)  Atrial  fibrillation with RVR -Per Cardiology  - persistent tachycardia likely influenced by infection (perirectal abscess)  - mild persistent tachycardia but has been stable on amiodarone; Cards has increased carvedilol dose on 11/12 - add prn IV lopressor   LAA Thrombus -unable to proceed with DCCV at this time -Eliquis per Cards  Dehydration -Resolved  - Due to DKA   Acute respiratory failure with hypoxia due to progressive systolic dysfunction EF 51-76% -EF in March 45-50% with associated grade 2 diastolic dysfunction  - echo this admission demonstrated worsened EF now down to 25% likely mediated by persistent tachycardia  - cont oxygen  - euvolemic off Lasix  Peri rectal abscess/Hemorrhoids -Initially treated presumptively for hemorrhoids but developed foul smelling, yellow/tan drainage spontaneously from left buttock near rectum c/w peri rectal abscess  - also has external hemorrhoids on exam so cont Anusol HCL  - surgery documented since spontaneously draining no indications to pursue I/D at this time -wound cx positive for Klebsiella sensitive to Zosyn   Persistent Leukocytosis/Abnormal UA -Follow up on cx from 11/11  Hypertension -Blood pressure soft in setting of tachycardia and volume depletion (on Carvedilol and Cozaar at home)  H/O aortic valve replacement with tissue graft  Tenosynovitis of finger -plan is to pursue minor surgical procedure at the bedside using local anesthesia  - cardiologist ok with holding Eliquis for procedure but recommended to Orthopedics to resume post porcedure  Patient non adherence Discussed at length with patient 11/5  - okay with plans to transition to insulin at discharge  -agrees diabetes education consult would be beneficial  DVT prophylaxis: SCDs Code Status: FULL Disposition: SDU  due to issues with recurrent tachycardia  Antibiotics: Unasyn 11/20> 11/11 Flagyl 11/10 > 11/11 Zosyn 11/11 >  Objective: Blood pressure 106/56,  pulse 86, temperature 98.5 F (36.9 C), temperature source Oral, resp. rate 14, height 6' (1.829 m), weight 230 lb 6.1 oz (104.5 kg), SpO2 98 %.  Intake/Output Summary (Last 24 hours) at 07/29/14 1336 Last data filed at 07/29/14 1100  Gross per 24 hour  Intake 206.83 ml  Output   2592 ml  Net -2385.17 ml    Exam: Gen: No acute respiratory distress Chest: CTA, no wheeze; RA Cardiac: Irregular, slightly tachycardic rate and rhythm, S1-S2, no rubs murmurs or gallops, no peripheral edema Abdomen: Soft nontender nondistended without obvious hepatosplenomegaly, no ascites Rectal: still draining bloody purulent material from open sinus tract; foul smelling Extremities: Symmetrical in appearance without cyanosis, clubbing or effusion  Scheduled Meds:  Scheduled Meds: . amiodarone  200 mg Oral BID  . antiseptic oral rinse  7 mL Mouth Rinse BID  . aspirin EC  81 mg Oral Daily  . atorvastatin  10 mg Oral q morning - 10a  . carvedilol  37.5 mg Oral BID WC  .  ceFAZolin (ANCEF) IV  2 g Intravenous On Call to OR  . chlorhexidine  60 mL Topical Once  . docusate sodium  100 mg Oral BID  . hydrocortisone  25 mg Rectal BID  . insulin aspart  0-20 Units Subcutaneous TID WC  . insulin aspart  3 Units Subcutaneous TID WC  . insulin glargine  34 Units Subcutaneous QHS  . piperacillin-tazobactam (ZOSYN)  IV  3.375 g Intravenous Q8H  . polyethylene glycol  17 g Oral Daily    Data Reviewed: Basic Metabolic Panel:  Recent Labs Lab 07/24/14 0350 07/25/14 0325 07/26/14 0335 07/28/14 0320 07/29/14 0320  NA 131* 132* 132* 130* 131*  K 3.8 3.5* 4.3 4.1 4.1  CL 96 97 97 93* 94*  CO2 21 25 23 26 25   GLUCOSE 235* 192* 204* 110* 209*  BUN 8 7 8 12 10   CREATININE 0.55 0.64 0.52 0.63 0.58  CALCIUM 8.2* 8.1* 8.3* 8.1* 7.9*  MG  --   --   --  1.8  --      Liver Function Tests:  Recent Labs Lab 07/23/14 0230 07/28/14 0320 07/29/14 0320  AST 14 17 14   ALT 10 16 13   ALKPHOS 219* 139* 136*    BILITOT 0.3 0.4 0.4  PROT 6.2 6.1 5.8*  ALBUMIN 2.5* 2.2* 2.1*   CBC:  Recent Labs Lab 07/24/14 0350 07/25/14 0325 07/26/14 0335 07/28/14 0320 07/29/14 0320  WBC 20.5* 19.4* 18.5* 24.2* 23.3*  NEUTROABS  --   --   --  19.4*  --   HGB 12.8* 11.7* 12.2* 11.8* 11.8*  HCT 37.5* 34.2* 35.7* 35.1* 34.9*  MCV 81.9 81.2 81.1 83.2 81.9  PLT 277 285 304 360 344   Cardiac Enzymes: No results for input(s): CKTOTAL, CKMB, CKMBINDEX, TROPONINI in the last 168 hours. CBG:  Recent Labs Lab 07/28/14 2042 07/29/14 0022 07/29/14 0454 07/29/14 0747 07/29/14 1117  GLUCAP 256* 237* 192* 181* 143*    Recent Results (from the past 240 hour(s))  Culture, blood (routine x 2)     Status: None   Collection Time: 07/21/14  4:26 PM  Result Value Ref Range Status   Specimen Description BLOOD RIGHT HAND  Final   Special Requests BOTTLES DRAWN AEROBIC AND ANAEROBIC 5CC  Final   Culture  Setup Time   Final  07/21/2014 22:18 Performed at [place]   Final    NO GROWTH 5 DAYS Performed at [facility]    Report Status 07/27/2014 FINAL  Final  Culture, blood (routine x 2)     Status: None   Collection Time: 07/21/14  4:32 PM  Result Value Ref Range Status   Specimen Description BLOOD LEFT ARM  Final   Special Requests BOTTLES DRAWN AEROBIC AND ANAEROBIC 10CC  Final   Culture  Setup Time   Final    07/21/2014 22:17 Performed at [place]   Final    NO GROWTH 5 DAYS Performed at [facility]    Report Status 07/27/2014 FINAL  Final  MRSA PCR Screening     Status: None   Collection Time: 07/21/14  6:41 PM  Result Value Ref Range Status   MRSA by PCR NEGATIVE NEGATIVE Final    Comment:        The GeneXpert MRSA Assay (FDA approved for NASAL specimens only), is one component of a comprehensive MRSA colonization surveillance program. It is not intended to diagnose MRSA infection nor to guide or monitor treatment for MRSA  infections.   Wound culture     Status: None   Collection Time: 07/27/14  9:13 AM  Result Value Ref Range Status   Specimen Description WOUND BUTTOCKS  Final   Special Requests Normal  Final   Gram Stain   Final    FEW WBC PRESENT,BOTH PMN AND MONONUCLEAR NO SQUAMOUS EPITHELIAL CELLS SEEN FEW GRAM NEGATIVE RODS Performed at [facility]    Culture   Final    MODERATE KLEBSIELLA PNEUMONIAE Performed at [facility]    Report Status 07/29/2014 FINAL  Final   Organism ID, Bacteria KLEBSIELLA PNEUMONIAE  Final      Susceptibility   Klebsiella pneumoniae - MIC*    AMPICILLIN >=32 RESISTANT Resistant     AMPICILLIN/SULBACTAM 4 SENSITIVE Sensitive     CEFAZOLIN <=4 SENSITIVE Sensitive     CEFEPIME <=1 SENSITIVE Sensitive     CEFTAZIDIME <=1 SENSITIVE Sensitive     CEFTRIAXONE <=1 SENSITIVE Sensitive     CIPROFLOXACIN <=0.25 SENSITIVE Sensitive     GENTAMICIN <=1 SENSITIVE Sensitive     IMIPENEM <=0.25 SENSITIVE Sensitive     PIP/TAZO <=4 SENSITIVE Sensitive     TOBRAMYCIN <=1 SENSITIVE Sensitive     TRIMETH/SULFA <=20 SENSITIVE Sensitive     * MODERATE KLEBSIELLA PNEUMONIAE     Studies:  Recent x-ray studies have been reviewed in detail by the Attending Physician  Time spent :  40 mins  Erin Hearing, ANP [facility] Office  [phone] Pager [phone]  On-Call/Text Page:      [email or web address]      password [email or web address]  If 7PM-7AM, please contact night-coverage [email or web address] Password [email or web address] 07/29/2014, 1:36 PM   LOS: 8 days   Examined patient and discussed assessment and plan with ANP Ebony Hail, and agree with above plan. Patient with multiple complex medical problems> 40 minutes spent in direct patient care

## 2014-07-30 ENCOUNTER — Encounter (HOSPITAL_COMMUNITY): Payer: Self-pay | Admitting: Orthopedic Surgery

## 2014-07-30 LAB — BASIC METABOLIC PANEL
Anion gap: 9 (ref 5–15)
BUN: 9 mg/dL (ref 6–23)
CO2: 28 mEq/L (ref 19–32)
Calcium: 8 mg/dL — ABNORMAL LOW (ref 8.4–10.5)
Chloride: 99 mEq/L (ref 96–112)
Creatinine, Ser: 0.65 mg/dL (ref 0.50–1.35)
GFR calc Af Amer: >90 mL/min (ref >90)
GFR calc non Af Amer: >90 mL/min (ref >90)
Glucose, Bld: 178 mg/dL — ABNORMAL HIGH (ref 70–99)
Potassium: 3.7 mEq/L (ref 3.7–5.3)
Sodium: 136 mEq/L — ABNORMAL LOW (ref 137–147)

## 2014-07-30 LAB — CBC
HCT: 34.3 % — ABNORMAL LOW (ref 39.0–52.0)
Hemoglobin: 11.6 g/dL — ABNORMAL LOW (ref 13.0–17.0)
MCH: 28 pg (ref 26.0–34.0)
MCHC: 33.8 g/dL (ref 30.0–36.0)
MCV: 82.9 fL (ref 78.0–100.0)
Platelets: 362 10*3/uL (ref 150–400)
RBC: 4.14 MIL/uL — ABNORMAL LOW (ref 4.22–5.81)
RDW: 13.1 % (ref 11.5–15.5)
WBC: 18.5 10*3/uL — ABNORMAL HIGH (ref 4.0–10.5)

## 2014-07-30 LAB — GLUCOSE, CAPILLARY
Glucose-Capillary: 259 mg/dL — ABNORMAL HIGH (ref 70–99)
Glucose-Capillary: 124 mg/dL — ABNORMAL HIGH (ref 70–99)
Glucose-Capillary: 207 mg/dL — ABNORMAL HIGH (ref 70–99)
Glucose-Capillary: 221 mg/dL — ABNORMAL HIGH (ref 70–99)
Glucose-Capillary: 184 mg/dL — ABNORMAL HIGH (ref 70–99)

## 2014-07-30 MED ORDER — APIXABAN 5 MG PO TABS
5.0000 mg | ORAL_TABLET | Freq: Two times a day (BID) | ORAL | Status: DC
  Administered 2014-07-31 – 2014-08-07 (×16): 5 mg via ORAL
  Filled 2014-07-30 (×18): qty 1

## 2014-07-30 MED ORDER — DILTIAZEM HCL 30 MG PO TABS
30.0000 mg | ORAL_TABLET | Freq: Four times a day (QID) | ORAL | Status: DC
  Administered 2014-07-30 – 2014-07-31 (×5): 30 mg via ORAL
  Filled 2014-07-30 (×8): qty 1

## 2014-07-30 MED ORDER — INSULIN GLARGINE 100 UNIT/ML ~~LOC~~ SOLN
36.0000 [IU] | Freq: Every day | SUBCUTANEOUS | Status: DC
  Administered 2014-07-30: 36 [IU] via SUBCUTANEOUS
  Filled 2014-07-30 (×2): qty 0.36

## 2014-07-30 MED ORDER — INSULIN ASPART 100 UNIT/ML ~~LOC~~ SOLN
6.0000 [IU] | Freq: Three times a day (TID) | SUBCUTANEOUS | Status: DC
  Administered 2014-07-30 – 2014-07-31 (×2): 6 [IU] via SUBCUTANEOUS

## 2014-07-30 NOTE — Op Note (Signed)
NAMESANDIP, POWER NO.:  [id number]  MEDICAL RECORD NO.:  [id number]  LOCATION:                                 FACILITY:  PHYSICIAN:  Sheral Apley. 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  PROCEDURE:  Irrigation with middle phalangeal level amputation with fillet flap, volar advancement.  SURGEON:  Sheral Apley. Burney Gauze, M.D.  ASSISTANT:  None.  ANESTHESIA:  Local.  TOURNIQUET:  No tourniquet.  COMPLICATIONS:  No complication.  Cultures sent, 1 specimen sent.  DESCRIPTION OF PROCEDURE:  Twenty-five minutes prior being taken to operating suite, I injected 7 mL of a combination of 1% lidocaine with epinephrine 1:100,000 and 1 mL a bicarb solution into the middle and proximal phalanges volarly on the right long finger.  The patient was __________ 20 minutes, was then taken to the operating suite and prepped and draped in usual sterile fashion.  Once this was done, we approached the long finger dorsally.  There was complete loss of the extensor mechanism and exposed DIP joint.  There was also exposed head of the middle phalanx which had become disengaged from the shaft of the middle phalanx.  We __________ the volar skin excised.  The nail plate, nail bed, distal phalanx, and part of the middle phalanx sent for pathologic confirmation.  We excised all nonviable edges and then advanced the volar skin from the fillet flap over the dorsal skin and sutured it with 4-0 Vicryl Rapide.  Prior to doing this, we smoothed out the middle phalanx with a rongeur; and prior to the closure, we  irrigated with a liter of normal saline.  Cultures again were obtained prior to all this as well.  He then had 2 large dog ears at the tips which we excised with 15 blade and fixed with the Vicryl Rapide suture.  Xeroform, 4x4s, and compression Coban wrap was applied. The patient tolerated the procedure well in concealed fashion.     Sheral Apley Burney Gauze, M.D.     MAW/MEDQ  D:  07/29/2014  T:  07/30/2014  Job:  [id number]

## 2014-07-30 NOTE — Progress Notes (Signed)
Thomas Wright  Thomas Wright HUD:[id number] DOB: [birth year] DOA: 07/21/2014 PCP: [name], MD   Brief narrative: 68 yo M with PMH of DM, HTN, Afib, h/o bioprosthetic valve replacement in 2008, h/o tenosynovitis and I&D in 8/15 who presented to the ER with generalized malaise, and chest pain, and was found to have Afib with RVR.  Cards admitted him.    He had not been taking his oral hypoglycemics for months - his CBGs had been running in 300s-400s for weeks.  His CBGs have become much better controlled while hospitalized with the use of long-acting insulin. Since plans are to discharge home on insulin Glucotrol was discontinued on 11/9. Pre authorization was completed for Lantus Solostar flexpen and patient was approved; email printed and placed on shadow chart.   Has had leukocytosis since admission. Has been experiencing rectal pain presumed to be hemorrhoidal but developed redness and spontaneous drainage c/w peri rectal abscess. Empiric anbx's started and surgery consulted. Since area spontaneously draining no indications for incision and drainage.  HPI/Subjective: Pt states he feels better in general today.  Reports no pain in buttocks at all.  No cp or sob.  Feels weak/deconditioned.    Assessment/Plan:  Diabetic ketoacidosis / diabetes mellitus Suspect initial DKA driven by smouldering perirectal abscess - Hemoglobin A1c > 11 - now on Lantus - will need insulin at discharge - diabetes educator consulted - pre authorization for Lantus Flexpen completed 11/6 and email response confirming approval printed and placed in shadow chart - cont to adjust medical tx as CBG not yet at goal - needs ACE as soon as BP tolerates (currently marginally low at times)  Parox Atrial fibrillation with RVR Per Cardiology - persistent tachycardia - cardizem added today per Cards - follow    LAA Thrombus unable to proceed with DCCV at this time - Eliquis  per Cards  Dehydration Resolved - Due to DKA   Acute respiratory failure with hypoxia due to progressive systolic dysfunction EF 85-88% EF in March 45-50% with associated grade 2 diastolic dysfunction - echo this admission demonstrated worsened EF now down to 25% likely mediated by persistent tachycardia - cont oxygen - euvolemic off Lasix  Peri rectal abscess/Hemorrhoids/persistent leukocytosis developed foul smelling, yellow/tan drainage spontaneously from left buttock near rectum c/w peri rectal abscess - also has external hemorrhoids on exam - cont Anusol HCL - surgery noted since spontaneously draining no indications to pursue I/D at this time - wound cx positive for Klebsiella sensitive to Zosyn   Hypertension Blood pressure soft in setting of tachycardia and volume depletion (on Carvedilol and Cozaar at home)  H/O aortic valve replacement with tissue graft no SBE on TEE 07/23/2014  Tenosynovitis of finger Now s/p amputation - doing well with no signif pain in hand   Patient non adherence Discussed at length with patient 11/5 - okay with plans to transition to insulin at discharge - agrees diabetes education consult would be beneficial  DVT prophylaxis: SCDs Code Status: FULL Disposition: SDU due to persistent tachycardia   Procedures: 11/6 TEE - EF 25-30% - calcified thrombus in LAA 11/12 - right long finger P-2 level amputation under local   Consults: Cardiology  Hand Surgery   Antibiotics: Unasyn 11/20> 11/11 Flagyl 11/10 > 11/11 Zosyn 11/11 >  Objective: Blood pressure 127/88, pulse 130, temperature 98.2 F (36.8 C), temperature source Oral, resp. rate 16, height 6' (1.829 m), weight 110.4 kg (243 lb 6.2 oz), SpO2  97 %.  Intake/Output Summary (Last 24 hours) at 07/30/14 1157 Last data filed at 07/30/14 0431  Gross per 24 hour  Intake    300 ml  Output   1000 ml  Net   -700 ml    Exam: Gen: No acute respiratory distress - alert and conversant  Chest: CTA  th/o w/o crackles, no wheeze Cardiac: Irregular, slightly tachycardic rate and rhythm, no rubs murmurs or gallops Abdomen: Soft nontender nondistended without obvious hepatosplenomegaly, no ascites Extremities: no signif cyanosis or clubbing - R hand wound dressed and dry   Scheduled Meds:  Scheduled Meds: . amiodarone  200 mg Oral BID  . antiseptic oral rinse  7 mL Mouth Rinse BID  . [START ON 07/31/2014] apixaban  5 mg Oral BID  . aspirin EC  81 mg Oral Daily  . atorvastatin  10 mg Oral q morning - 10a  . carvedilol  37.5 mg Oral BID WC  . diltiazem  30 mg Oral QID  . docusate sodium  100 mg Oral BID  . hydrocortisone  25 mg Rectal BID  . insulin aspart  0-20 Units Subcutaneous TID WC  . insulin aspart  3 Units Subcutaneous TID WC  . insulin glargine  34 Units Subcutaneous QHS  . piperacillin-tazobactam (ZOSYN)  IV  3.375 g Intravenous Q8H  . polyethylene glycol  17 g Oral Daily    Data Reviewed: Basic Metabolic Panel:  Recent Labs Lab 07/25/14 0325 07/26/14 0335 07/28/14 0320 07/29/14 0320 07/30/14 0325  NA 132* 132* 130* 131* 136*  K 3.5* 4.3 4.1 4.1 3.7  CL 97 97 93* 94* 99  CO2 25 23 26 25 28   GLUCOSE 546* 204* 110* 209* 178*  BUN 7 8 12 10 9   CREATININE 0.64 0.52 0.63 0.58 0.65  CALCIUM 8.1* 8.3* 8.1* 7.9* 8.0*  MG  --   --  1.8  --   --      Liver Function Tests:  Recent Labs Lab 07/28/14 0320 07/29/14 0320  AST 17 14  ALT 16 13  ALKPHOS 139* 136*  BILITOT 0.4 0.4  PROT 6.1 5.8*  ALBUMIN 2.2* 2.1*   CBC:  Recent Labs Lab 07/25/14 0325 07/26/14 0335 07/28/14 0320 07/29/14 0320 07/30/14 0325  WBC 19.4* 18.5* 24.2* 23.3* 18.5*  NEUTROABS  --   --  19.4*  --   --   HGB 11.7* 12.2* 11.8* 11.8* 11.6*  HCT 34.2* 35.7* 35.1* 34.9* 34.3*  MCV 81.2 81.1 83.2 81.9 82.9  PLT 285 304 360 344 362   CBG:  Recent Labs Lab 07/29/14 1117 07/29/14 1405 07/29/14 1602 07/29/14 2122 07/30/14 0749  GLUCAP 143* 139* 154* 259* 124*    Recent Results  (from the past 240 hour(s))  Culture, blood (routine x 2)     Status: None   Collection Time: 07/21/14  4:26 PM  Result Value Ref Range Status   Specimen Description BLOOD RIGHT HAND  Final   Special Requests BOTTLES DRAWN AEROBIC AND ANAEROBIC 5CC  Final   Culture  Setup Time   Final    07/21/2014 22:18 Performed at [place]   Final    NO GROWTH 5 DAYS Performed at [facility]    Report Status 07/27/2014 FINAL  Final  Culture, blood (routine x 2)     Status: None   Collection Time: 07/21/14  4:32 PM  Result Value Ref Range Status   Specimen Description BLOOD LEFT ARM  Final  Special Requests BOTTLES DRAWN AEROBIC AND ANAEROBIC 10CC  Final   Culture  Setup Time   Final    07/21/2014 22:17 Performed at [place]   Final    NO GROWTH 5 DAYS Performed at [facility]    Report Status 07/27/2014 FINAL  Final  MRSA PCR Screening     Status: None   Collection Time: 07/21/14  6:41 PM  Result Value Ref Range Status   MRSA by PCR NEGATIVE NEGATIVE Final    Comment:        The GeneXpert MRSA Assay (FDA approved for NASAL specimens only), is one component of a comprehensive MRSA colonization surveillance program. It is not intended to diagnose MRSA infection nor to guide or monitor treatment for MRSA infections.   Wound culture     Status: None   Collection Time: 07/27/14  9:13 AM  Result Value Ref Range Status   Specimen Description WOUND BUTTOCKS  Final   Special Requests Normal  Final   Gram Stain   Final    FEW WBC PRESENT,BOTH PMN AND MONONUCLEAR NO SQUAMOUS EPITHELIAL CELLS SEEN FEW GRAM NEGATIVE RODS Performed at [facility]    Culture   Final    MODERATE KLEBSIELLA PNEUMONIAE Performed at [facility]    Report Status 07/29/2014 FINAL  Final   Organism ID, Bacteria KLEBSIELLA PNEUMONIAE  Final      Susceptibility   Klebsiella pneumoniae - MIC*    AMPICILLIN >=32 RESISTANT  Resistant     AMPICILLIN/SULBACTAM 4 SENSITIVE Sensitive     CEFAZOLIN <=4 SENSITIVE Sensitive     CEFEPIME <=1 SENSITIVE Sensitive     CEFTAZIDIME <=1 SENSITIVE Sensitive     CEFTRIAXONE <=1 SENSITIVE Sensitive     CIPROFLOXACIN <=0.25 SENSITIVE Sensitive     GENTAMICIN <=1 SENSITIVE Sensitive     IMIPENEM <=0.25 SENSITIVE Sensitive     PIP/TAZO <=4 SENSITIVE Sensitive     TOBRAMYCIN <=1 SENSITIVE Sensitive     TRIMETH/SULFA <=20 SENSITIVE Sensitive     * MODERATE KLEBSIELLA PNEUMONIAE  Urine culture     Status: None   Collection Time: 07/28/14  5:39 PM  Result Value Ref Range Status   Specimen Description URINE, RANDOM  Final   Special Requests NONE  Final   Culture  Setup Time   Final    07/28/2014 18:16 Performed at [place]   Final    >=100,000 COLONIES/ML Performed at [facility]    Culture   Final    Multiple bacterial morphotypes present, none predominant. Suggest appropriate recollection if clinically indicated. Performed at [facility]    Report Status 07/29/2014 FINAL  Final  Wound culture     Status: None (Preliminary result)   Collection Time: 07/29/14  2:58 PM  Result Value Ref Range Status   Specimen Description WOUND FINGER RIGHT  Final   Special Requests NONE  Final   Gram Stain   Final    MODERATE WBC PRESENT,BOTH PMN AND MONONUCLEAR NO SQUAMOUS EPITHELIAL CELLS SEEN FEW GRAM POSITIVE COCCI IN PAIRS RARE GRAM NEGATIVE RODS Performed at [facility]    Culture   Final    Culture reincubated for better growth Performed at [facility]    Report Status PENDING  Incomplete  Anaerobic culture     Status: None (Preliminary result)   Collection Time: 07/29/14  2:58 PM  Result Value Ref Range Status  Specimen Description WOUND FINGER RIGHT  Final   Special Requests NONE  Final   Gram Stain   Final    MODERATE WBC PRESENT,BOTH PMN AND MONONUCLEAR NO SQUAMOUS EPITHELIAL CELLS SEEN FEW GRAM  POSITIVE COCCI IN PAIRS RARE GRAM NEGATIVE RODS Performed at [facility]    Culture   Final    NO ANAEROBES ISOLATED; CULTURE IN PROGRESS FOR 5 DAYS Performed at [facility]    Report Status PENDING  Incomplete     Studies:  Recent x-ray studies have been reviewed in detail by the Attending Physician  Time spent :  60 mins  Cherene Altes, MD [facility] For Consults/Admissions - Flow Manager - [phone] Office  [phone] Pager [phone]  On-Call/Text Page:      [email or web address]      password [facility]  07/30/2014, 11:57 AM   LOS: 9 days

## 2014-07-30 NOTE — Plan of Care (Signed)
Problem: Phase II Progression Outcomes Goal: Progress activity as tolerated unless otherwise ordered Outcome: Progressing Goal: Vital signs remain stable Outcome: Completed/Met Date Met:  07/30/14

## 2014-07-30 NOTE — Progress Notes (Signed)
Patient ID: Thomas Wright, male   DOB: [birth year], 68 y.o.   MRN: [id number]      Subjective: Weak wants to walk and do PT.  Amputation of right index finger went well yesterday Continues on antibiotics    Objective: Vital signs in last 24 hours: Temp:  [97.6 F (36.4 C)-98.8 F (37.1 C)] 98.2 F (36.8 C) (11/13 0751) Pulse Rate:  [43-129] 70 (11/13 0751) Resp:  [14-32] 16 (11/13 0751) BP: (97-143)/(50-90) 127/88 mmHg (11/13 0751) SpO2:  [95 %-99 %] 97 % (11/13 0751) Weight:  [110.4 kg (243 lb 6.2 oz)] 110.4 kg (243 lb 6.2 oz) (11/13 0426) Last BM Date: 07/30/14  Intake/Output from previous day: 11/12 0701 - 11/13 0700 In: 350 [I.V.:300; IV Piggyback:50] Out: 2100 [Urine:2100] Intake/Output this shift:    Medications . amiodarone  200 mg Oral BID  . antiseptic oral rinse  7 mL Mouth Rinse BID  . aspirin EC  81 mg Oral Daily  . atorvastatin  10 mg Oral q morning - 10a  . carvedilol  37.5 mg Oral BID WC  . docusate sodium  100 mg Oral BID  . hydrocortisone  25 mg Rectal BID  . insulin aspart  0-20 Units Subcutaneous TID WC  . insulin aspart  3 Units Subcutaneous TID WC  . insulin glargine  34 Units Subcutaneous QHS  . piperacillin-tazobactam (ZOSYN)  IV  3.375 g Intravenous Q8H  . polyethylene glycol  17 g Oral Daily   . sodium chloride 10 mL/hr at 07/28/14 1819  . lactated ringers 50 mL/hr at 07/29/14 1338   PE: Affect appropriate Chronically ill white male  HEENT: normal Neck supple with no adenopathy JVP normal no bruits no thyromegaly Lungs clear with no wheezing and good diaphragmatic motion Heart:  S1/S2 SEM  murmur, no rub, gallop or click PMI normal Abdomen: benighn, BS positve, no tenderness, no AAA rectal abscess draining well less erythema  no bruit.  No HSM or HJR Distal pulses intact with no bruits No edema Neuro non-focal Dressing on right middle finger post amputation  Nurse indicates drainage from rectal area    Lab Results:   Recent Labs  07/28/14 0320 07/29/14 0320 07/30/14 0325  WBC 24.2* 23.3* 18.5*  HGB 11.8* 11.8* 11.6*  HCT 35.1* 34.9* 34.3*  PLT 360 344 362   BMET  Recent Labs  07/28/14 0320 07/29/14 0320 07/30/14 0325  NA 130* 131* 136*  K 4.1 4.1 3.7  CL 93* 94* 99  CO2 26 25 28   GLUCOSE 110* 209* 178*  BUN 12 10 9   CREATININE 0.63 0.58 0.65  CALCIUM 8.1* 7.9* 8.0*    Assessment/Plan  68 year old male with paroxysmal atrial fibrillation, stopped amiodarone in 2013 after no further episodes, with prior episode of postoperative atrial fibrillation following bioprosthetic aortic valve replacement in 2008 here with atypical chest discomfort, fatigue and was found to be in atrial fibrillation with RVR and in diabetic ketoacidosis.  1. Parox a-fib with RVR - coreg at 37.5 bid add cardizem 30 q 6 and transition to LA before d/c    TEE with LAA thrombus  Resume eliquis in am post amputation   2. H/O aortic valve replacement with tissue graft normal gradient and no SBE on TEE on 07/23/2014   3. Cardiomyopathy     Appears euvolemic   4. Diabetic ketoacidosis - resolved, managed by hospitalist service, started on Lantus, will need to be discharged on it    5. Tenosynovitis of finger  Post amputation  [name] to see Monday on antibiotics  6.  Skin care consult per nurse for rectal drainage  Patient has rectal abscess  Draining Dr Redmond Pulling has seen 7.  AVR no evidence of SBE on TEE  Normal gradients    Will order eliquis to restart in am  Cardiac status stable No DCC secondary to LAA thrombus  meds adjusted    LOS: 9 days    Jenkins Rouge

## 2014-07-30 NOTE — Progress Notes (Signed)
Physical Therapy Treatment Patient Details Name: Thomas Wright MRN: [id number] DOB: 09-12-1946 Today's Date: 07/30/2014    History of Present Illness 68 year old male with paroxysmal atrial fibrillation, stopped amiodarone in 2013 after no further episodes, with prior episode of postoperative atrial fibrillation following bioprosthetic aortic valve replacement in 2008 here with atypical chest discomfort, fatigue and was found to be in atrial fibrillation with RVR and in diabetic ketoacidosis, right long finger amputation on 07/29/14.    PT Comments    Patient ambulated in hall with RW, one noted LOB able to self correct, limited by fatigue this session. Patient VSS, HR stable. Patient OOB to chair, will continue to see and progress as tolerated.   Follow Up Recommendations  Home health PT;Supervision/Assistance - 24 hour (pending progress)     Equipment Recommendations  Rolling walker with 5" wheels    Recommendations for Other Services       Precautions / Restrictions Precautions Precautions: Fall Restrictions Weight Bearing Restrictions: No    Mobility  Bed Mobility Overal bed mobility: Needs Assistance Bed Mobility: Rolling;Supine to Sit Rolling: Supervision   Supine to sit: Min assist     General bed mobility comments: min assist to pull with right elbow since patient can not push through hand secondary to increased pain from surgery (digit).  Transfers Overall transfer level: Needs assistance Equipment used: Rolling walker (2 wheeled) Transfers: Sit to/from Stand Sit to Stand: Min assist Stand pivot transfers: Min assist       General transfer comment: VCs for hand placement, assist for stability, patient performed transfer from bed as well as to/from bedside commode  Ambulation/Gait Ambulation/Gait assistance: Min guard Ambulation Distance (Feet): 200 Feet Assistive device: Rolling walker (2 wheeled) Gait Pattern/deviations: Step-through pattern;Decreased  stride length;Drifts right/left;Trunk flexed;Narrow base of support Gait velocity: decreased (increased lateral sway) Gait velocity interpretation: Below normal speed for age/gender General Gait Details: One noted balance check able to self correct.   Stairs            Wheelchair Mobility    Modified Rankin (Stroke Patients Only)       Balance     Sitting balance-Leahy Scale: Good       Standing balance-Leahy Scale: Fair                      Cognition Arousal/Alertness: Awake/alert Behavior During Therapy: WFL for tasks assessed/performed Overall Cognitive Status: Within Functional Limits for tasks assessed                      Exercises      General Comments        Pertinent Vitals/Pain Pain Assessment: 0-10 Pain Score: 6  Pain Location: right hand Pain Descriptors / Indicators: Discomfort Pain Intervention(s): Repositioned    Home Living                      Prior Function            PT Goals (current goals can now be found in the care plan section) Acute Rehab PT Goals Patient Stated Goal: to go home  PT Goal Formulation: With patient Time For Goal Achievement: 08/09/14 Potential to Achieve Goals: Good    Frequency  Min 3X/week    PT Plan Current plan remains appropriate    Co-evaluation             End of Session Equipment Utilized During Treatment: Gait belt Activity Tolerance: Patient  tolerated treatment well;Patient limited by fatigue Patient left: in chair;with call bell/phone within reach     Time: 1352-1413 PT Time Calculation (min) (ACUTE ONLY): 21 min  Charges:  $Gait Training: 8-22 mins                    G CodesDuncan Dull 08-08-2014, 2:21 PM [name], [place] DPT  [phone]

## 2014-07-30 NOTE — Progress Notes (Signed)
ANTICOAGULATION CONSULT NOTE - Initial Consult  Pharmacy Consult for Apixaban Indication: atrial fibrillation  No Known Allergies  Patient Measurements: Height: 6' (182.9 cm) Weight: 243 lb 6.2 oz (110.4 kg) IBW/kg (Calculated) : 77.6 Heparin Dosing Weight:   Vital Signs: Temp: 98.2 F (36.8 C) (11/13 0751) Temp Source: Oral (11/13 0751) BP: 127/88 mmHg (11/13 0751) Pulse Rate: 130 (11/13 1010)  Labs:  Recent Labs  07/28/14 0320 07/29/14 0320 07/30/14 0325  HGB 11.8* 11.8* 11.6*  HCT 35.1* 34.9* 34.3*  PLT 360 344 362  LABPROT 19.0*  --   --   INR 1.58*  --   --   CREATININE 0.63 0.58 0.65    Estimated Creatinine Clearance: 116.5 mL/min (by C-G formula based on Cr of 0.65).   Medical History: Past Medical History  Diagnosis Date  . Diabetes mellitus   . Hypertension   . Atrial fibrillation   . Hypertriglyceridemia   . Afib     Stopped amiodarone 2013-no further episodes of atrial fibrillation. Postoperative 2008  . H/O aortic valve replacement with tissue graft     2008.  . Back pain   . Cardiomyopathy     EF originally 20%-now 45%, no CAD on cath    Assessment: 68yom to have apixaban restarted on 11/14 AM for nonvalvular Afib. Apixaban was started on 11/4 and then held on 11/11 for finger I&D and amputation. Cardiology found LAA on TEE so could not cardiovert - will schedule at later time. No need for increased apixaban dosing per Dr. Johnsie Cancel. - H/H and Plts stable - No significant bleeding reported - Age 68, SCr 0.65, Wt 110kg  Goal of Therapy:  Monitor platelets by anticoagulation protocol: Yes   Plan:  1. Restart Eliquis 5mg  PO BID on 11/14 AM 2. Klebsiella in wound - consider narrowing Zosyn to Ciprofloxacin or a narrower Cephalosporin based on culture/sensitivities 3. Pharmacy will sign-off. Please reconsult if additional assistance is needed  Earleen Newport  [phone] 07/30/2014,11:27 AM

## 2014-07-31 DIAGNOSIS — R079 Chest pain, unspecified: Secondary | ICD-10-CM | POA: Insufficient documentation

## 2014-07-31 LAB — COMPREHENSIVE METABOLIC PANEL
ALK PHOS: 103 U/L (ref 39–117)
ALT: 8 U/L (ref 0–53)
AST: 12 U/L (ref 0–37)
Albumin: 2.1 g/dL — ABNORMAL LOW (ref 3.5–5.2)
Anion gap: 12 (ref 5–15)
BUN: 9 mg/dL (ref 6–23)
CO2: 25 mEq/L (ref 19–32)
Calcium: 7.9 mg/dL — ABNORMAL LOW (ref 8.4–10.5)
Chloride: 94 mEq/L — ABNORMAL LOW (ref 96–112)
Creatinine, Ser: 0.68 mg/dL (ref 0.50–1.35)
GLUCOSE: 132 mg/dL — AB (ref 70–99)
POTASSIUM: 3.8 meq/L (ref 3.7–5.3)
SODIUM: 131 meq/L — AB (ref 137–147)
Total Bilirubin: 0.3 mg/dL (ref 0.3–1.2)
Total Protein: 5.9 g/dL — ABNORMAL LOW (ref 6.0–8.3)

## 2014-07-31 LAB — GLUCOSE, CAPILLARY
GLUCOSE-CAPILLARY: 141 mg/dL — AB (ref 70–99)
Glucose-Capillary: 95 mg/dL (ref 70–99)
Glucose-Capillary: 206 mg/dL — ABNORMAL HIGH (ref 70–99)
Glucose-Capillary: 140 mg/dL — ABNORMAL HIGH (ref 70–99)

## 2014-07-31 LAB — CBC
HCT: 37.1 % — ABNORMAL LOW (ref 39.0–52.0)
Hemoglobin: 12.2 g/dL — ABNORMAL LOW (ref 13.0–17.0)
MCH: 28 pg (ref 26.0–34.0)
MCHC: 32.9 g/dL (ref 30.0–36.0)
MCV: 85.1 fL (ref 78.0–100.0)
Platelets: 420 10*3/uL — ABNORMAL HIGH (ref 150–400)
RBC: 4.36 MIL/uL (ref 4.22–5.81)
RDW: 13.3 % (ref 11.5–15.5)
WBC: 16.8 10*3/uL — AB (ref 4.0–10.5)

## 2014-07-31 MED ORDER — ALPRAZOLAM 0.5 MG PO TABS
0.5000 mg | ORAL_TABLET | Freq: Once | ORAL | Status: AC
  Administered 2014-07-31: 0.5 mg via ORAL
  Filled 2014-07-31: qty 1

## 2014-07-31 MED ORDER — DILTIAZEM HCL 60 MG PO TABS
60.0000 mg | ORAL_TABLET | Freq: Four times a day (QID) | ORAL | Status: DC
  Administered 2014-07-31 – 2014-08-01 (×3): 60 mg via ORAL
  Filled 2014-07-31 (×6): qty 1

## 2014-07-31 MED ORDER — ZOLPIDEM TARTRATE 5 MG PO TABS
5.0000 mg | ORAL_TABLET | Freq: Once | ORAL | Status: AC
  Administered 2014-07-31: 5 mg via ORAL
  Filled 2014-07-31: qty 1

## 2014-07-31 MED ORDER — INSULIN GLARGINE 100 UNIT/ML ~~LOC~~ SOLN
40.0000 [IU] | Freq: Every day | SUBCUTANEOUS | Status: DC
  Administered 2014-07-31: 40 [IU] via SUBCUTANEOUS
  Filled 2014-07-31 (×2): qty 0.4

## 2014-07-31 MED ORDER — INSULIN ASPART 100 UNIT/ML ~~LOC~~ SOLN
8.0000 [IU] | Freq: Three times a day (TID) | SUBCUTANEOUS | Status: DC
  Administered 2014-07-31 – 2014-08-03 (×8): 8 [IU] via SUBCUTANEOUS

## 2014-07-31 NOTE — Progress Notes (Signed)
[name]  Thomas Wright XBL:[id number] DOB: May 18, 1946 DOA: 07/21/2014 PCP: [name], MD  Brief narrative: 68 yo M with PMH of DM, HTN, Afib, h/o bioprosthetic valve replacement in 2008, h/o tenosynovitis and I&D in 8/15 who presented to the ER with generalized malaise, and chest pain, and was found to have Afib with RVR.  Cards admitted him.    He had not been taking his oral hypoglycemics for months - his CBGs had been running in 300s-400s for weeks.  His CBGs have become much better controlled while hospitalized with the use of long-acting insulin. Since plans are to discharge home on insulin Glucotrol was discontinued on 11/9. Pre authorization was completed for Lantus Solostar flexpen and patient was approved; email printed and placed on shadow chart.   Has had leukocytosis since admission. Has been experiencing rectal pain presumed to be hemorrhoidal but developed redness and spontaneous drainage c/w peri rectal abscess. Empiric anbx's started and surgery consulted. Since area spontaneously draining no indications for incision and drainage.  HPI/Subjective: Pt reported an episode of chest tightness this morning, that appears to have coincided with and episode of RVR.  Sx have spontaneously resolved.  Denies sob or abdom pain.  Does report having developed watery diarrhea last night.    Assessment/Plan:  Diabetic ketoacidosis / diabetes mellitus Suspect initial DKA driven by smouldering perirectal abscess - Hemoglobin A1c > 11 - now on Lantus - will need insulin at discharge - diabetes educator consulted - pre authorization for Lantus Flexpen completed 11/6 and email response confirming approval printed and placed in shadow chart - cont to adjust medical tx as CBG still not yet at goal - needs ACE as soon as BP tolerates (currently marginally low at times)  Parox Atrial fibrillation with RVR Per Cardiology - persistent tachycardia -  cardizem added per Cards 11/13 - I will increase dose today and follow BP  LAA Thrombus unable to proceed with DCCV at this time - Eliquis per Cards  Dehydration Resolved - Due to DKA   Acute respiratory failure with hypoxia due to progressive systolic dysfunction EF 30-07% EF in March 45-50% with associated grade 2 diastolic dysfunction - echo this admission demonstrated worsened EF now down to 25% likely mediated by persistent tachycardia - cont oxygen - euvolemic off Lasix  Peri rectal abscess / Hemorrhoids  developed foul smelling, yellow/tan drainage spontaneously from left buttock near rectum c/w peri rectal abscess - also has external hemorrhoids on exam - cont Anusol HCL - Surgery noted no indication to pursue I&D at this time - wound cx positive for Klebsiella sensitive to Zosyn   Hypertension Blood pressure soft - follow w/ adjustment of rate controlling meds   H/O aortic valve replacement with tissue graft no SBE on TEE 07/23/2014  Tenosynovitis of finger Now s/p amputation - doing well with no signif pain in hand   Patient non adherence Discussed at length with patient 11/5 - okay with plans to transition to insulin at discharge - agrees diabetes education consult would be beneficial  DVT prophylaxis: SCDs Code Status: FULL Disposition: SDU due to persistent tachycardia   Procedures: 11/6 TEE - EF 25-30% - calcified thrombus in LAA 11/12 - right long finger P-2 level amputation under local   Consults: Cardiology  Hand Surgery   Antibiotics: Unasyn 11/20> 11/11 Flagyl 11/10 > 11/11 Zosyn 11/11 >  Objective: Blood pressure 107/65, pulse 85, temperature 97.6 F (36.4 C), temperature source Axillary,  resp. rate 18, height 6' (1.829 m), weight 108.9 kg (240 lb 1.3 oz), SpO2 93 %.  Intake/Output Summary (Last 24 hours) at 07/31/14 1507 Last data filed at 07/31/14 1400  Gross per 24 hour  Intake    620 ml  Output   1550 ml  Net   -930 ml    Exam: Gen: No  acute respiratory distress - alert and conversant  Chest: CTA th/o w/o crackles or wheeze Cardiac: Irregular, slightly tachycardic rate and rhythm, no rubs murmurs or gallops Abdomen: Soft nontender nondistended without obvious hepatosplenomegaly, no ascites Extremities: no signif cyanosis or clubbing - R hand wound dressed and dry   Scheduled Meds:  Scheduled Meds: . amiodarone  200 mg Oral BID  . antiseptic oral rinse  7 mL Mouth Rinse BID  . apixaban  5 mg Oral BID  . aspirin EC  81 mg Oral Daily  . atorvastatin  10 mg Oral q morning - 10a  . carvedilol  37.5 mg Oral BID WC  . diltiazem  30 mg Oral QID  . docusate sodium  100 mg Oral BID  . hydrocortisone  25 mg Rectal BID  . insulin aspart  0-20 Units Subcutaneous TID WC  . insulin aspart  6 Units Subcutaneous TID WC  . insulin glargine  36 Units Subcutaneous QHS  . piperacillin-tazobactam (ZOSYN)  IV  3.375 g Intravenous Q8H  . polyethylene glycol  17 g Oral Daily    Data Reviewed: Basic Metabolic Panel:  Recent Labs Lab 07/26/14 0335 07/28/14 0320 07/29/14 0320 07/30/14 0325 07/31/14 0330  NA 132* 130* 131* 136* 131*  K 4.3 4.1 4.1 3.7 3.8  CL 97 93* 94* 99 94*  CO2 23 26 25 28 25   GLUCOSE 204* 110* 209* 178* 132*  BUN 8 12 10 9 9   CREATININE 0.52 0.63 0.58 0.65 0.68  CALCIUM 8.3* 8.1* 7.9* 8.0* 7.9*  MG  --  1.8  --   --   --      Liver Function Tests:  Recent Labs Lab 07/28/14 0320 07/29/14 0320 07/31/14 0330  AST 17 14 12   ALT 16 13 8   ALKPHOS 139* 136* 103  BILITOT 0.4 0.4 0.3  PROT 6.1 5.8* 5.9*  ALBUMIN 2.2* 2.1* 2.1*   CBC:  Recent Labs Lab 07/26/14 0335 07/28/14 0320 07/29/14 0320 07/30/14 0325 07/31/14 0330  WBC 18.5* 24.2* 23.3* 18.5* 16.8*  NEUTROABS  --  19.4*  --   --   --   HGB 12.2* 11.8* 11.8* 11.6* 12.2*  HCT 35.7* 35.1* 34.9* 34.3* 37.1*  MCV 81.1 83.2 81.9 82.9 85.1  PLT 304 360 344 362 420*   CBG:  Recent Labs Lab 07/30/14 1210 07/30/14 1624 07/30/14 2158  07/31/14 0825 07/31/14 1315  GLUCAP 207* 221* 184* 95 206*    Recent Results (from the past 240 hour(s))  Culture, blood (routine x 2)     Status: None   Collection Time: 07/21/14  4:26 PM  Result Value Ref Range Status   Specimen Description BLOOD RIGHT HAND  Final   Special Requests BOTTLES DRAWN AEROBIC AND ANAEROBIC 5CC  Final   Culture  Setup Time   Final    07/21/2014 22:18 Performed at [facility]   Final    NO GROWTH 5 DAYS Performed at [facility]    Report Status 07/27/2014 FINAL  Final  Culture, blood (routine x 2)     Status: None   Collection Time:  07/21/14  4:32 PM  Result Value Ref Range Status   Specimen Description BLOOD LEFT ARM  Final   Special Requests BOTTLES DRAWN AEROBIC AND ANAEROBIC 10CC  Final   Culture  Setup Time   Final    07/21/2014 22:17 Performed at [place]   Final    NO GROWTH 5 DAYS Performed at [facility]    Report Status 07/27/2014 FINAL  Final  MRSA PCR Screening     Status: None   Collection Time: 07/21/14  6:41 PM  Result Value Ref Range Status   MRSA by PCR NEGATIVE NEGATIVE Final    Comment:        The GeneXpert MRSA Assay (FDA approved for NASAL specimens only), is one component of a comprehensive MRSA colonization surveillance program. It is not intended to diagnose MRSA infection nor to guide or monitor treatment for MRSA infections.   Wound culture     Status: None   Collection Time: 07/27/14  9:13 AM  Result Value Ref Range Status   Specimen Description WOUND BUTTOCKS  Final   Special Requests Normal  Final   Gram Stain   Final    FEW WBC PRESENT,BOTH PMN AND MONONUCLEAR NO SQUAMOUS EPITHELIAL CELLS SEEN FEW GRAM NEGATIVE RODS Performed at [facility]    Culture   Final    MODERATE KLEBSIELLA PNEUMONIAE Performed at [facility]    Report Status 07/29/2014 FINAL  Final   Organism ID, Bacteria KLEBSIELLA PNEUMONIAE  Final       Susceptibility   Klebsiella pneumoniae - MIC*    AMPICILLIN >=32 RESISTANT Resistant     AMPICILLIN/SULBACTAM 4 SENSITIVE Sensitive     CEFAZOLIN <=4 SENSITIVE Sensitive     CEFEPIME <=1 SENSITIVE Sensitive     CEFTAZIDIME <=1 SENSITIVE Sensitive     CEFTRIAXONE <=1 SENSITIVE Sensitive     CIPROFLOXACIN <=0.25 SENSITIVE Sensitive     GENTAMICIN <=1 SENSITIVE Sensitive     IMIPENEM <=0.25 SENSITIVE Sensitive     PIP/TAZO <=4 SENSITIVE Sensitive     TOBRAMYCIN <=1 SENSITIVE Sensitive     TRIMETH/SULFA <=20 SENSITIVE Sensitive     * MODERATE KLEBSIELLA PNEUMONIAE  Urine culture     Status: None   Collection Time: 07/28/14  5:39 PM  Result Value Ref Range Status   Specimen Description URINE, RANDOM  Final   Special Requests NONE  Final   Culture  Setup Time   Final    07/28/2014 18:16 Performed at [facility]   Final    >=100,000 COLONIES/ML Performed at [facility]    Culture   Final    Multiple bacterial morphotypes present, none predominant. Suggest appropriate recollection if clinically indicated. Performed at [facility]    Report Status 07/29/2014 FINAL  Final  Wound culture     Status: None (Preliminary result)   Collection Time: 07/29/14  2:58 PM  Result Value Ref Range Status   Specimen Description WOUND FINGER RIGHT  Final   Special Requests NONE  Final   Gram Stain   Final    MODERATE WBC PRESENT,BOTH PMN AND MONONUCLEAR NO SQUAMOUS EPITHELIAL CELLS SEEN FEW GRAM POSITIVE COCCI IN PAIRS RARE GRAM NEGATIVE RODS Performed at [facility]    Culture   Final    MODERATE STAPHYLOCOCCUS AUREUS Note: RIFAMPIN AND GENTAMICIN SHOULD NOT BE USED AS SINGLE DRUGS FOR TREATMENT OF STAPH INFECTIONS. Performed at [facility]    Report Status PENDING  Incomplete  Anaerobic culture     Status: None (Preliminary result)   Collection Time: 07/29/14  2:58 PM  Result Value Ref Range Status   Specimen Description WOUND  FINGER RIGHT  Final   Special Requests NONE  Final   Gram Stain   Final    MODERATE WBC PRESENT,BOTH PMN AND MONONUCLEAR NO SQUAMOUS EPITHELIAL CELLS SEEN FEW GRAM POSITIVE COCCI IN PAIRS RARE GRAM NEGATIVE RODS Performed at [facility]    Culture   Final    NO ANAEROBES ISOLATED; CULTURE IN PROGRESS FOR 5 DAYS Performed at [facility]    Report Status PENDING  Incomplete     Studies:  Recent x-ray studies have been reviewed in detail by the Attending Physician  Time spent :  78 mins  Cherene Altes, MD [facility] For Consults/Admissions - Flow Manager - [phone] Office  [phone] Pager [phone]  On-Call/Text Page:      [email or web address]      password [facility]  07/31/2014, 3:07 PM   LOS: 10 days

## 2014-07-31 NOTE — Progress Notes (Signed)
Patient complaining of pressure in his chest and difficulty breathing. When assessing pain, he states that "it's not really chest pain, just pressure".  The patient appears calm and relaxed. Vital signs are as follows:  HR 130s, O2 96-100%, RR 16-21, BP 115/88.  Dr Thereasa Solo paged and made aware.  Will continue to monitor.

## 2014-07-31 NOTE — Progress Notes (Signed)
SUBJECTIVE:  No complaints  OBJECTIVE:   Vitals:   Filed Vitals:   07/30/14 2134 07/31/14 0015 07/31/14 0355 07/31/14 0745  BP: 115/50 117/56 118/69 138/81  Pulse:  84 102 118  Temp:  98.3 F (36.8 C) 98.8 F (37.1 C) 97.7 F (36.5 C)  TempSrc:  Oral Oral Oral  Resp:  11 18 16   Height:      Weight:   240 lb 1.3 oz (108.9 kg)   SpO2:  93% 94% 96%   I&O's:   Intake/Output Summary (Last 24 hours) at 07/31/14 1233 Last data filed at 07/31/14 1100  Gross per 24 hour  Intake    590 ml  Output    100 ml  Net    490 ml   TELEMETRY: Reviewed telemetry pt in atrial fibrillation with HR 85bpm     PHYSICAL EXAM General: Well developed, well nourished, in no acute distress Head: Eyes PERRLA, No xanthomas.   Normal cephalic and atramatic  Lungs:   Clear bilaterally to auscultation and percussion. Heart:   Irregularly irregular S1 S2 Pulses are 2+ & equal. Abdomen: Bowel sounds are positive, abdomen soft and non-tender without masses Extremities:   No clubbing, cyanosis or edema.  DP +1 Neuro: Alert and oriented X 3. Psych:  Good affect, responds appropriately   LABS: Basic Metabolic Panel:  Recent Labs  07/30/14 0325 07/31/14 0330  NA 136* 131*  K 3.7 3.8  CL 99 94*  CO2 28 25  GLUCOSE 178* 132*  BUN 9 9  CREATININE 0.65 0.68  CALCIUM 8.0* 7.9*   Liver Function Tests:  Recent Labs  07/29/14 0320 07/31/14 0330  AST 14 12  ALT 13 8  ALKPHOS 136* 103  BILITOT 0.4 0.3  PROT 5.8* 5.9*  ALBUMIN 2.1* 2.1*   No results for input(s): LIPASE, AMYLASE in the last 72 hours. CBC:  Recent Labs  07/30/14 0325 07/31/14 0330  WBC 18.5* 16.8*  HGB 11.6* 12.2*  HCT 34.3* 37.1*  MCV 82.9 85.1  PLT 362 420*   Cardiac Enzymes: No results for input(s): CKTOTAL, CKMB, CKMBINDEX, TROPONINI in the last 72 hours. BNP: Invalid input(s): POCBNP D-Dimer: No results for input(s): DDIMER in the last 72 hours. Hemoglobin A1C: No results for input(s): HGBA1C in the last  72 hours. Fasting Lipid Panel: No results for input(s): CHOL, HDL, LDLCALC, TRIG, CHOLHDL, LDLDIRECT in the last 72 hours. Thyroid Function Tests: No results for input(s): TSH, T4TOTAL, T3FREE, THYROIDAB in the last 72 hours.  Invalid input(s): FREET3 Anemia Panel: No results for input(s): VITAMINB12, FOLATE, FERRITIN, TIBC, IRON, RETICCTPCT in the last 72 hours. Coag Panel:   Lab Results  Component Value Date   INR 1.58* 07/28/2014   INR 1.27 07/23/2014    RADIOLOGY: Dg Chest Port 1 View  07/25/2014   CLINICAL DATA:  Short of breath. History of cardiomyopathy and aortic valve replacement. History of hypertension.  EXAM: PORTABLE CHEST - 1 VIEW  COMPARISON:  07/23/2014.  FINDINGS: Changes from cardiac surgery are stable. Cardiac silhouette is normal in overall size and configuration. No mediastinal or hilar masses.  Stable elevation the right hemidiaphragm.  Lungs are clear.  No pleural effusion or pneumothorax.  IMPRESSION: No acute cardiopulmonary disease.   Electronically Signed   By: Lajean Manes M.D.   On: 07/25/2014 17:46   Dg Chest Port 1 View  07/23/2014   CLINICAL DATA:  Hypoxia.  EXAM: PORTABLE CHEST - 1 VIEW  COMPARISON:  07/21/2014  FINDINGS: Patient has  had median sternotomy. Heart is enlarged. There is mild interstitial edema. No focal consolidations or pleural effusions are identified. Elevation of the right hemidiaphragm is stable.  IMPRESSION: Cardiomegaly and mild edema.   Electronically Signed   By: Shon Hale M.D.   On: 07/23/2014 08:04   Dg Chest Port 1 View  07/21/2014   CLINICAL DATA:  68 year old male presenting with acute onset of chest pain.  EXAM: PORTABLE CHEST - 1 VIEW  COMPARISON:  Chest x-ray 10/03/2010.  FINDINGS: Lung volumes are low. Chronic elevation of the right hemidiaphragm is unchanged. No consolidative airspace disease. No pleural effusions. No pneumothorax. No pulmonary nodule or mass noted. Pulmonary vasculature and the cardiomediastinal silhouette  are within normal limits. Status post median sternotomy.  IMPRESSION: 1. Low lung volumes without radiographic evidence of acute cardiopulmonary disease.   Electronically Signed   By: Vinnie Langton M.D.   On: 07/21/2014 10:23   Assessment/Plan  68 year old male with paroxysmal atrial fibrillation, stopped amiodarone in 2013 after no further episodes, with prior episode of postoperative atrial fibrillation following bioprosthetic aortic valve replacement in 2008 here with atypical chest discomfort, fatigue and was found to be in atrial fibrillation with RVR and in diabetic ketoacidosis.  1. A-fib with RVR - Amio 200mg  BID, coreg at 37.5 bid added cardizem 30 q 6- will transition to LA before d/c.TEE with LAA thrombus Resume eliquis today  2. H/O aortic valve replacement with tissue graft normal gradient and no SBE on TEE on 07/23/2014  3. Cardiomyopathy Appears euvolemic   4. Diabetic ketoacidosis - resolved, managed by hospitalist service, started on Lantus, will need to be discharged on it   5. Tenosynovitis of finger Post amputation [name] to see Monday on antibiotics  6. Skin care consult per nurse for rectal drainage Patient has rectal abscess Draining Dr Redmond Pulling has seen 7. AVR no evidence of SBE on TEE Normal gradients   Will order eliquis to restart this am  Cardiac status stable No DCC secondary to LAA thrombus meds adjusted    Sueanne Margarita, MD  07/31/2014  12:33 PM

## 2014-08-01 LAB — CBC
HCT: 35.2 % — ABNORMAL LOW (ref 39.0–52.0)
Hemoglobin: 11.4 g/dL — ABNORMAL LOW (ref 13.0–17.0)
MCH: 27.6 pg (ref 26.0–34.0)
MCHC: 32.4 g/dL (ref 30.0–36.0)
MCV: 85.2 fL (ref 78.0–100.0)
Platelets: 390 10*3/uL (ref 150–400)
RBC: 4.13 MIL/uL — AB (ref 4.22–5.81)
RDW: 13.4 % (ref 11.5–15.5)
WBC: 16.3 10*3/uL — ABNORMAL HIGH (ref 4.0–10.5)

## 2014-08-01 LAB — BASIC METABOLIC PANEL
Anion gap: 9 (ref 5–15)
BUN: 8 mg/dL (ref 6–23)
CHLORIDE: 94 meq/L — AB (ref 96–112)
CO2: 25 mEq/L (ref 19–32)
Calcium: 7.9 mg/dL — ABNORMAL LOW (ref 8.4–10.5)
Creatinine, Ser: 0.65 mg/dL (ref 0.50–1.35)
GFR calc non Af Amer: >90 mL/min (ref >90)
GLUCOSE: 202 mg/dL — AB (ref 70–99)
POTASSIUM: 3.9 meq/L (ref 3.7–5.3)
Sodium: 128 mEq/L — ABNORMAL LOW (ref 137–147)

## 2014-08-01 LAB — CLOSTRIDIUM DIFFICILE BY PCR: Toxigenic C. Difficile by PCR: NEGATIVE

## 2014-08-01 LAB — GLUCOSE, CAPILLARY
GLUCOSE-CAPILLARY: 171 mg/dL — AB (ref 70–99)
Glucose-Capillary: 233 mg/dL — ABNORMAL HIGH (ref 70–99)
Glucose-Capillary: 176 mg/dL — ABNORMAL HIGH (ref 70–99)
Glucose-Capillary: 244 mg/dL — ABNORMAL HIGH (ref 70–99)

## 2014-08-01 LAB — WOUND CULTURE

## 2014-08-01 MED ORDER — INSULIN GLARGINE 100 UNIT/ML ~~LOC~~ SOLN
44.0000 [IU] | Freq: Every day | SUBCUTANEOUS | Status: DC
  Administered 2014-08-01 – 2014-08-02 (×2): 44 [IU] via SUBCUTANEOUS
  Filled 2014-08-01 (×3): qty 0.44

## 2014-08-01 MED ORDER — DILTIAZEM HCL ER COATED BEADS 120 MG PO TB24
240.0000 mg | ORAL_TABLET | Freq: Every day | ORAL | Status: DC
  Administered 2014-08-01: 240 mg via ORAL
  Filled 2014-08-01 (×2): qty 2

## 2014-08-01 MED ORDER — CEFUROXIME AXETIL 500 MG PO TABS
500.0000 mg | ORAL_TABLET | Freq: Two times a day (BID) | ORAL | Status: DC
  Administered 2014-08-02 – 2014-08-05 (×7): 500 mg via ORAL
  Filled 2014-08-01 (×11): qty 1

## 2014-08-01 MED ORDER — ZOLPIDEM TARTRATE 5 MG PO TABS
5.0000 mg | ORAL_TABLET | Freq: Once | ORAL | Status: AC
  Administered 2014-08-01: 5 mg via ORAL
  Filled 2014-08-01: qty 1

## 2014-08-01 NOTE — Progress Notes (Addendum)
SUBJECTIVE:  No complaints- HR has been elevate, but improved today.  OBJECTIVE:   Vitals:   Filed Vitals:   08/01/14 0014 08/01/14 0300 08/01/14 0900 08/01/14 0918  BP: 116/57 124/72 131/74 131/74  Pulse: 81 88 128 128  Temp: 97.3 F (36.3 C) 97.5 F (36.4 C) 97.5 F (36.4 C)   TempSrc: Axillary Axillary Oral   Resp: 27 24 27    Height:      Weight:  242 lb 8 oz (109.997 kg)    SpO2: 99% 97% 97%    I&O's:    Intake/Output Summary (Last 24 hours) at 08/01/14 1034 Last data filed at 08/01/14 0658  Gross per 24 hour  Intake    540 ml  Output   3950 ml  Net  -3410 ml   TELEMETRY: Reviewed telemetry pt in atrial fibrillation with HR 85bpm     PHYSICAL EXAM General: Well developed, well nourished, in no acute distress Head: Eyes PERRLA, No xanthomas.   Normal cephalic and atramatic  Lungs:   Clear bilaterally to auscultation and percussion. Heart:   Irregularly irregular S1 S2 Pulses are 2+ & equal. Abdomen: Bowel sounds are positive, abdomen soft and non-tender without masses Extremities:   No clubbing, cyanosis or edema.  DP +1 Neuro: Alert and oriented X 3. Psych:  Good affect, responds appropriately   LABS: Basic Metabolic Panel:  Recent Labs  07/31/14 0330 08/01/14 0036  NA 131* 128*  K 3.8 3.9  CL 94* 94*  CO2 25 25  GLUCOSE 132* 202*  BUN 9 8  CREATININE 0.68 0.65  CALCIUM 7.9* 7.9*   Liver Function Tests:  Recent Labs  07/31/14 0330  AST 12  ALT 8  ALKPHOS 103  BILITOT 0.3  PROT 5.9*  ALBUMIN 2.1*   No results for input(s): LIPASE, AMYLASE in the last 72 hours. CBC:  Recent Labs  07/31/14 0330 08/01/14 0036  WBC 16.8* 16.3*  HGB 12.2* 11.4*  HCT 37.1* 35.2*  MCV 85.1 85.2  PLT 420* 390   Cardiac Enzymes: No results for input(s): CKTOTAL, CKMB, CKMBINDEX, TROPONINI in the last 72 hours. BNP: Invalid input(s): POCBNP D-Dimer: No results for input(s): DDIMER in the last 72 hours. Hemoglobin A1C: No results for input(s):  HGBA1C in the last 72 hours. Fasting Lipid Panel: No results for input(s): CHOL, HDL, LDLCALC, TRIG, CHOLHDL, LDLDIRECT in the last 72 hours. Thyroid Function Tests: No results for input(s): TSH, T4TOTAL, T3FREE, THYROIDAB in the last 72 hours.  Invalid input(s): FREET3 Anemia Panel: No results for input(s): VITAMINB12, FOLATE, FERRITIN, TIBC, IRON, RETICCTPCT in the last 72 hours. Coag Panel:   Lab Results  Component Value Date   INR 1.58* 07/28/2014   INR 1.27 07/23/2014    RADIOLOGY: Dg Chest Port 1 View  07/25/2014   CLINICAL DATA:  Short of breath. History of cardiomyopathy and aortic valve replacement. History of hypertension.  EXAM: PORTABLE CHEST - 1 VIEW  COMPARISON:  07/23/2014.  FINDINGS: Changes from cardiac surgery are stable. Cardiac silhouette is normal in overall size and configuration. No mediastinal or hilar masses.  Stable elevation the right hemidiaphragm.  Lungs are clear.  No pleural effusion or pneumothorax.  IMPRESSION: No acute cardiopulmonary disease.   Electronically Signed   By: Lajean Manes M.D.   On: 07/25/2014 17:46   Dg Chest Port 1 View  07/23/2014   CLINICAL DATA:  Hypoxia.  EXAM: PORTABLE CHEST - 1 VIEW  COMPARISON:  07/21/2014  FINDINGS: Patient has had median sternotomy.  Heart is enlarged. There is mild interstitial edema. No focal consolidations or pleural effusions are identified. Elevation of the right hemidiaphragm is stable.  IMPRESSION: Cardiomegaly and mild edema.   Electronically Signed   By: Shon Hale M.D.   On: 07/23/2014 08:04   Dg Chest Port 1 View  07/21/2014   CLINICAL DATA:  68 year old male presenting with acute onset of chest pain.  EXAM: PORTABLE CHEST - 1 VIEW  COMPARISON:  Chest x-ray 10/03/2010.  FINDINGS: Lung volumes are low. Chronic elevation of the right hemidiaphragm is unchanged. No consolidative airspace disease. No pleural effusions. No pneumothorax. No pulmonary nodule or mass noted. Pulmonary vasculature and the  cardiomediastinal silhouette are within normal limits. Status post median sternotomy.  IMPRESSION: 1. Low lung volumes without radiographic evidence of acute cardiopulmonary disease.   Electronically Signed   By: Vinnie Langton M.D.   On: 07/21/2014 10:23   Assessment/Plan  68 year old male with paroxysmal atrial fibrillation, stopped amiodarone in 2013 after no further episodes, with prior episode of postoperative atrial fibrillation following bioprosthetic aortic valve replacement in 2008 here with atypical chest discomfort, fatigue and was found to be in atrial fibrillation with RVR and in diabetic ketoacidosis.  1. A-fib with RVR - Amio 200mg  BID, coreg at 37.5 bid - changed to cardizem LA 240 mg daily today. TEE with LAA thrombus. Will need to be on Eliquis for 1 month, then repeat TEE to ensure resolution of thrombus.  2. H/O aortic valve replacement with tissue graft normal gradient and no SBE on TEE on 07/23/2014  3. Cardiomyopathy Appears euvolemic   4. Diabetic ketoacidosis - resolved, managed by hospitalist service, started on Lantus, will need to be discharged on it   5. Tenosynovitis of finger Post amputation [name] to see Monday on antibiotics  6. Skin care consult per nurse for rectal drainage Patient has rectal abscess Draining Dr Redmond Pulling has seen 7. AVR no evidence of SBE on TEE Normal gradients   Landree Fernholz C, MD  08/01/2014  10:34 AM

## 2014-08-01 NOTE — Progress Notes (Signed)
[name]  Thomas Wright:[id number] DOB: 1946/03/30 DOA: 07/21/2014 PCP: [name], MD  Brief narrative: 68 yo M with PMH of DM, HTN, Afib, h/o bioprosthetic valve replacement in 2008, h/o tenosynovitis and I&D in 8/15 who presented to the ER with generalized malaise, and chest pain, and was found to have Afib with RVR.  Cards admitted him.    He had not been taking his oral hypoglycemics for months - his CBGs had been running in 300s-400s for weeks.  His CBGs have become much better controlled while hospitalized with the use of long-acting insulin. Since plans are to discharge home on insulin Glucotrol was discontinued on 11/9. Pre authorization was completed for Lantus Solostar flexpen and patient was approved; email printed and placed on shadow chart.   Has had leukocytosis since admission. Has been experiencing rectal pain presumed to be hemorrhoidal but developed redness and spontaneous drainage c/w peri rectal abscess. Empiric anbx's started and surgery consulted. Since area spontaneously draining no indications for incision and drainage.  HPI/Subjective: No more chest discomfort. No new complaints.  Anxious for d/c home.    Assessment/Plan:  Diabetic ketoacidosis / diabetes mellitus Suspect initial DKA driven by smouldering perirectal abscess - Hemoglobin A1c > 11 - now on Lantus - will need insulin at discharge - diabetes educator consulted - pre authorization for Lantus Flexpen completed 11/6 and email response confirming approval printed and placed in shadow chart - cont to adjust medical tx as CBG still not yet at goal - needs ACE as soon as BP tolerates (currently marginally low at times)  Parox Atrial fibrillation with RVR Per Cardiology - persistent tachycardia now appears resolved w/ increased dose of cardizem   LAA Thrombus unable to proceed with DCCV at this time - Eliquis per Cards  Dehydration Resolved - Due to  DKA   Acute respiratory failure with hypoxia due to progressive systolic dysfunction EF 93-81% EF in March 45-50% with associated grade 2 diastolic dysfunction - echo this admission demonstrated worsened EF now down to 25% likely mediated by persistent tachycardia - weaned off oxygen - euvolemic off Lasix  Peri rectal abscess / Hemorrhoids  developed foul smelling, yellow/tan drainage spontaneously from left buttock near rectum c/w peri rectal abscess - also has external hemorrhoids on exam - cont Anusol HCL - Surgery noted no indication to pursue I&D at this time - wound cx positive for Klebsiella sensitive to Zosyn - transition to oral abx in AM for d/c   Hypertension BP currently well controlled   H/O aortic valve replacement with tissue graft no SBE on TEE 07/23/2014  Tenosynovitis of finger Now s/p amputation - doing well with no signif pain in hand   Patient non adherence Discussed at length with patient 11/5 - okay with plans to transition to insulin at discharge - agrees diabetes education consult would be beneficial  DVT prophylaxis: SCDs Code Status: FULL Disposition: SDU due to persistent tachycardia   Procedures: 11/6 TEE - EF 25-30% - calcified thrombus in LAA 11/12 - right long finger P-2 level amputation under local   Consults: Cardiology  Hand Surgery   Antibiotics: Unasyn 11/20> 11/11 Flagyl 11/10 > 11/11 Zosyn 11/11 >11/15  Objective: Blood pressure 114/52, pulse 81, temperature 98 F (36.7 C), temperature source Oral, resp. rate 27, height 6' (1.829 m), weight 109.997 kg (242 lb 8 oz), SpO2 100 %.  Intake/Output Summary (Last 24 hours) at 08/01/14 1658 Last data filed at  08/01/14 1400  Gross per 24 hour  Intake    870 ml  Output   3000 ml  Net  -2130 ml    Exam: Gen: No acute respiratory distress - alert and conversant  Chest: CTA th/o w/o crackles or wheeze Cardiac: Irregular, slightly tachycardic rate and rhythm, no rubs murmurs or  gallops Abdomen: Soft nontender nondistended without obvious hepatosplenomegaly, no ascites Extremities: no signif cyanosis or clubbing - R hand dressed and dry   Scheduled Meds:  Scheduled Meds: . amiodarone  200 mg Oral BID  . antiseptic oral rinse  7 mL Mouth Rinse BID  . apixaban  5 mg Oral BID  . aspirin EC  81 mg Oral Daily  . atorvastatin  10 mg Oral q morning - 10a  . carvedilol  37.5 mg Oral BID WC  . diltiazem  240 mg Oral Daily  . docusate sodium  100 mg Oral BID  . hydrocortisone  25 mg Rectal BID  . insulin aspart  0-20 Units Subcutaneous TID WC  . insulin aspart  8 Units Subcutaneous TID WC  . insulin glargine  40 Units Subcutaneous QHS  . piperacillin-tazobactam (ZOSYN)  IV  3.375 g Intravenous Q8H  . polyethylene glycol  17 g Oral Daily    Data Reviewed: Basic Metabolic Panel:  Recent Labs Lab 07/28/14 0320 07/29/14 0320 07/30/14 0325 07/31/14 0330 08/01/14 0036  NA 130* 131* 136* 131* 128*  K 4.1 4.1 3.7 3.8 3.9  CL 93* 94* 99 94* 94*  CO2 26 25 28 25 25   GLUCOSE 110* 209* 178* 132* 202*  BUN 12 10 9 9 8   CREATININE 0.63 0.58 0.65 0.68 0.65  CALCIUM 8.1* 7.9* 8.0* 7.9* 7.9*  MG 1.8  --   --   --   --      Liver Function Tests:  Recent Labs Lab 07/28/14 0320 07/29/14 0320 07/31/14 0330  AST 17 14 12   ALT 16 13 8   ALKPHOS 139* 136* 103  BILITOT 0.4 0.4 0.3  PROT 6.1 5.8* 5.9*  ALBUMIN 2.2* 2.1* 2.1*   CBC:  Recent Labs Lab 07/28/14 0320 07/29/14 0320 07/30/14 0325 07/31/14 0330 08/01/14 0036  WBC 24.2* 23.3* 18.5* 16.8* 16.3*  NEUTROABS 19.4*  --   --   --   --   HGB 11.8* 11.8* 11.6* 12.2* 11.4*  HCT 35.1* 34.9* 34.3* 37.1* 35.2*  MCV 83.2 81.9 82.9 85.1 85.2  PLT 360 344 362 420* 390   CBG:  Recent Labs Lab 07/31/14 1641 07/31/14 2107 08/01/14 0857 08/01/14 1209 08/01/14 1618  GLUCAP 141* 140* 171* 233* 176*    Recent Results (from the past 240 hour(s))  Wound culture     Status: None   Collection Time: 07/27/14   9:13 AM  Result Value Ref Range Status   Specimen Description WOUND BUTTOCKS  Final   Special Requests Normal  Final   Gram Stain   Final    FEW WBC PRESENT,BOTH PMN AND MONONUCLEAR NO SQUAMOUS EPITHELIAL CELLS SEEN FEW GRAM NEGATIVE RODS Performed at [facility]    Culture   Final    MODERATE KLEBSIELLA PNEUMONIAE Performed at [facility]    Report Status 07/29/2014 FINAL  Final   Organism ID, Bacteria KLEBSIELLA PNEUMONIAE  Final      Susceptibility   Klebsiella pneumoniae - MIC*    AMPICILLIN >=32 RESISTANT Resistant     AMPICILLIN/SULBACTAM 4 SENSITIVE Sensitive     CEFAZOLIN <=4 SENSITIVE Sensitive  CEFEPIME <=1 SENSITIVE Sensitive     CEFTAZIDIME <=1 SENSITIVE Sensitive     CEFTRIAXONE <=1 SENSITIVE Sensitive     CIPROFLOXACIN <=0.25 SENSITIVE Sensitive     GENTAMICIN <=1 SENSITIVE Sensitive     IMIPENEM <=0.25 SENSITIVE Sensitive     PIP/TAZO <=4 SENSITIVE Sensitive     TOBRAMYCIN <=1 SENSITIVE Sensitive     TRIMETH/SULFA <=20 SENSITIVE Sensitive     * MODERATE KLEBSIELLA PNEUMONIAE  Urine culture     Status: None   Collection Time: 07/28/14  5:39 PM  Result Value Ref Range Status   Specimen Description URINE, RANDOM  Final   Special Requests NONE  Final   Culture  Setup Time   Final    07/28/2014 18:16 Performed at [place]   Final    >=100,000 COLONIES/ML Performed at [facility]    Culture   Final    Multiple bacterial morphotypes present, none predominant. Suggest appropriate recollection if clinically indicated. Performed at [facility]    Report Status 07/29/2014 FINAL  Final  Wound culture     Status: None   Collection Time: 07/29/14  2:58 PM  Result Value Ref Range Status   Specimen Description WOUND FINGER RIGHT  Final   Special Requests NONE  Final   Gram Stain   Final    MODERATE WBC PRESENT,BOTH PMN AND MONONUCLEAR NO SQUAMOUS EPITHELIAL CELLS SEEN FEW GRAM POSITIVE COCCI IN  PAIRS RARE GRAM NEGATIVE RODS Performed at [facility]    Culture   Final    MODERATE STAPHYLOCOCCUS AUREUS Note: RIFAMPIN AND GENTAMICIN SHOULD NOT BE USED AS SINGLE DRUGS FOR TREATMENT OF STAPH INFECTIONS. Performed at [facility]    Report Status 08/01/2014 FINAL  Final   Organism ID, Bacteria STAPHYLOCOCCUS AUREUS  Final      Susceptibility   Staphylococcus aureus - MIC*    CLINDAMYCIN >=8 RESISTANT Resistant     ERYTHROMYCIN >=8 RESISTANT Resistant     GENTAMICIN <=0.5 SENSITIVE Sensitive     LEVOFLOXACIN <=0.12 SENSITIVE Sensitive     OXACILLIN 1 SENSITIVE Sensitive     PENICILLIN >=0.5 RESISTANT Resistant     RIFAMPIN <=0.5 SENSITIVE Sensitive     TRIMETH/SULFA <=10 SENSITIVE Sensitive     VANCOMYCIN <=0.5 SENSITIVE Sensitive     TETRACYCLINE >=16 RESISTANT Resistant     MOXIFLOXACIN <=0.25 SENSITIVE Sensitive     * MODERATE STAPHYLOCOCCUS AUREUS  Anaerobic culture     Status: None (Preliminary result)   Collection Time: 07/29/14  2:58 PM  Result Value Ref Range Status   Specimen Description WOUND FINGER RIGHT  Final   Special Requests NONE  Final   Gram Stain   Final    MODERATE WBC PRESENT,BOTH PMN AND MONONUCLEAR NO SQUAMOUS EPITHELIAL CELLS SEEN FEW GRAM POSITIVE COCCI IN PAIRS RARE GRAM NEGATIVE RODS Performed at [facility]    Culture   Final    NO ANAEROBES ISOLATED; CULTURE IN PROGRESS FOR 5 DAYS Performed at [facility]    Report Status PENDING  Incomplete  Clostridium Difficile by PCR     Status: None   Collection Time: 07/31/14  3:59 PM  Result Value Ref Range Status   C difficile by pcr NEGATIVE NEGATIVE Final     Studies:  Recent x-ray studies have been reviewed in detail by the Attending Physician  Time spent :  25 mins  Cherene Altes, MD [facility] For Consults/Admissions -  Flow Manager - [phone] Office  [phone] Pager [phone]  On-Call/Text Page:      [email or web address]       password [facility]  08/01/2014, 4:58 PM   LOS: 11 days

## 2014-08-02 LAB — GLUCOSE, CAPILLARY
GLUCOSE-CAPILLARY: 150 mg/dL — AB (ref 70–99)
Glucose-Capillary: 107 mg/dL — ABNORMAL HIGH (ref 70–99)
Glucose-Capillary: 197 mg/dL — ABNORMAL HIGH (ref 70–99)
Glucose-Capillary: 211 mg/dL — ABNORMAL HIGH (ref 70–99)

## 2014-08-02 LAB — BASIC METABOLIC PANEL
Anion gap: 11 (ref 5–15)
BUN: 12 mg/dL (ref 6–23)
CO2: 26 mEq/L (ref 19–32)
Calcium: 7.8 mg/dL — ABNORMAL LOW (ref 8.4–10.5)
Chloride: 95 mEq/L — ABNORMAL LOW (ref 96–112)
Creatinine, Ser: 0.76 mg/dL (ref 0.50–1.35)
GFR calc Af Amer: >90 mL/min (ref >90)
Glucose, Bld: 232 mg/dL — ABNORMAL HIGH (ref 70–99)
Potassium: 4.2 mEq/L (ref 3.7–5.3)
Sodium: 132 mEq/L — ABNORMAL LOW (ref 137–147)

## 2014-08-02 LAB — CBC
HCT: 34.6 % — ABNORMAL LOW (ref 39.0–52.0)
HEMOGLOBIN: 11.3 g/dL — AB (ref 13.0–17.0)
MCH: 27.9 pg (ref 26.0–34.0)
MCHC: 32.7 g/dL (ref 30.0–36.0)
MCV: 85.4 fL (ref 78.0–100.0)
PLATELETS: 414 10*3/uL — AB (ref 150–400)
RBC: 4.05 MIL/uL — ABNORMAL LOW (ref 4.22–5.81)
RDW: 13.2 % (ref 11.5–15.5)
WBC: 15.8 10*3/uL — ABNORMAL HIGH (ref 4.0–10.5)

## 2014-08-02 MED ORDER — DILTIAZEM HCL ER COATED BEADS 240 MG PO CP24
240.0000 mg | ORAL_CAPSULE | Freq: Every day | ORAL | Status: DC
  Administered 2014-08-02 – 2014-08-03 (×2): 240 mg via ORAL
  Filled 2014-08-02 (×2): qty 1

## 2014-08-02 MED ORDER — AMOXICILLIN-POT CLAVULANATE 875-125 MG PO TABS: 1.0000 | ORAL_TABLET | Freq: Two times a day (BID) | ORAL | Status: DC

## 2014-08-02 MED ORDER — ALPRAZOLAM 0.25 MG PO TABS
0.2500 mg | ORAL_TABLET | Freq: Three times a day (TID) | ORAL | Status: DC | PRN
  Administered 2014-08-02 – 2014-08-04 (×4): 0.25 mg via ORAL
  Filled 2014-08-02 (×5): qty 1

## 2014-08-02 MED ORDER — ALPRAZOLAM 0.25 MG PO TABS
0.2500 mg | ORAL_TABLET | Freq: Once | ORAL | Status: AC
  Administered 2014-08-02: 0.25 mg via ORAL
  Filled 2014-08-02: qty 1

## 2014-08-02 NOTE — Progress Notes (Signed)
Physical Therapy Treatment Patient Details Name: Thomas Wright MRN: [id number] DOB: 10/03/1945 Today's Date: 08/02/2014    History of Present Illness 68 year old male with paroxysmal atrial fibrillation, stopped amiodarone in 2013 after no further episodes, with prior episode of postoperative atrial fibrillation following bioprosthetic aortic valve replacement in 2008 here with atypical chest discomfort, fatigue and was found to be in atrial fibrillation with RVR and in diabetic ketoacidosis, right long finger amputation on 07/29/14.    PT Comments    Patient tolerated increased activity this session. Ambulated increased distance, VSS, HR elevated to 110s with activity, SpO2 97% on room air at rest. Patient with +BM during session, will continue to see and progress as tolerated.   Follow Up Recommendations  Home health PT;Supervision/Assistance - 24 hour (pending progress)     Equipment Recommendations  Rolling walker with 5" wheels    Recommendations for Other Services       Precautions / Restrictions Precautions Precautions: Fall Restrictions Weight Bearing Restrictions: No    Mobility  Bed Mobility Overal bed mobility: Needs Assistance Bed Mobility: Rolling;Supine to Sit Rolling: Supervision   Supine to sit: Supervision     General bed mobility comments: Increased time to perform, rolling in both directions, assist with pericare and hygiene   Transfers Overall transfer level: Needs assistance Equipment used: Rolling walker (2 wheeled) Transfers: Sit to/from Stand Sit to Stand: Min guard         General transfer comment: Improved ability to come to elevated position this session  Ambulation/Gait Ambulation/Gait assistance: Supervision;Min guard Ambulation Distance (Feet): 480 Feet Assistive device: Rolling walker (2 wheeled) Gait Pattern/deviations: Step-through pattern;Decreased stride length;Trunk flexed;Drifts right/left Gait velocity: decreased Gait  velocity interpretation: Below normal speed for age/gender General Gait Details: VCs for upright posture, relaxation of shoulders and one standing rest break   Stairs            Wheelchair Mobility    Modified Rankin (Stroke Patients Only)       Balance     Sitting balance-Leahy Scale: Good       Standing balance-Leahy Scale: Fair                      Cognition Arousal/Alertness: Awake/alert Behavior During Therapy: WFL for tasks assessed/performed Overall Cognitive Status: Within Functional Limits for tasks assessed                      Exercises      General Comments General comments (skin integrity, edema, etc.): patient assisted with pericare and hygiene during session      Pertinent Vitals/Pain Pain Assessment: No/denies pain    Home Living                      Prior Function            PT Goals (current goals can now be found in the care plan section) Acute Rehab PT Goals Patient Stated Goal: to get moving more and go home PT Goal Formulation: With patient Time For Goal Achievement: 08/09/14 Potential to Achieve Goals: Good Progress towards PT goals: Progressing toward goals    Frequency  Min 3X/week    PT Plan Current plan remains appropriate    Co-evaluation             End of Session Equipment Utilized During Treatment: Gait belt Activity Tolerance: Patient tolerated treatment well;Patient limited by fatigue Patient left: in chair;with call bell/phone within  reach     Time: 1036-1101 PT Time Calculation (min) (ACUTE ONLY): 25 min  Charges:  $Gait Training: 8-22 mins $Therapeutic Activity: 8-22 mins                    G CodesDuncan Dull 08/14/2014, 11:06 AM [name], PT DPT  [phone]

## 2014-08-02 NOTE — Progress Notes (Signed)
Patient Name: Thomas Wright Date of Encounter: 08/02/2014     Principal Problem:   Diabetic ketoacidosis Active Problems:   Cardiomyopathy   H/O aortic valve replacement with tissue graft   Atrial fibrillation with RVR   Hypertension   Tenosynovitis of finger   Dehydration   Patient non adherence   Leukocytosis   LA thrombus   Acute on chronic respiratory failure with hypoxia   Systolic CHF, acute on chronic   Left atrial thrombus   Perirectal abscess   Other hemorrhoids   Noncompliance with medication regimen   Type 2 diabetes mellitus with ketoacidosis without coma   Chest pain    SUBJECTIVE  Remains in atrial fibrillation with controlled ventricular response on carvedilol and Apixaban and diltiazem.  No chest pain. Mild shortness of breath earlier today has resolved on its own. His weight has been going up.  CURRENT MEDS . amiodarone  200 mg Oral BID  . antiseptic oral rinse  7 mL Mouth Rinse BID  . apixaban  5 mg Oral BID  . aspirin EC  81 mg Oral Daily  . atorvastatin  10 mg Oral q morning - 10a  . carvedilol  37.5 mg Oral BID WC  . cefUROXime  500 mg Oral BID WC  . diltiazem  240 mg Oral Daily  . docusate sodium  100 mg Oral BID  . hydrocortisone  25 mg Rectal BID  . insulin aspart  0-20 Units Subcutaneous TID WC  . insulin aspart  8 Units Subcutaneous TID WC  . insulin glargine  44 Units Subcutaneous QHS  . polyethylene glycol  17 g Oral Daily    OBJECTIVE  Filed Vitals:   08/02/14 0017 08/02/14 0134 08/02/14 0410 08/02/14 0829  BP: 119/47 127/70 141/68 128/78  Pulse:  82 62 108  Temp: 97.8 F (36.6 C)  97.8 F (36.6 C) 97.9 F (36.6 C)  TempSrc: Oral  Oral Oral  Resp: 24 35 23 27  Height:      Weight:   247 lb 9.2 oz (112.3 kg)   SpO2: 93% 95% 95% 98%    Intake/Output Summary (Last 24 hours) at 08/02/14 0952 Last data filed at 08/02/14 0500  Gross per 24 hour  Intake    630 ml  Output   1903 ml  Net  -1273 ml   Filed Weights   07/31/14 0355 08/01/14 0300 08/02/14 0410  Weight: 240 lb 1.3 oz (108.9 kg) 242 lb 8 oz (109.997 kg) 247 lb 9.2 oz (112.3 kg)    PHYSICAL EXAM  General: Pleasant, NAD. Neuro: Alert and oriented X 3. Moves all extremities spontaneously. Psych: Normal affect. HEENT:  Normal  Neck: Supple without bruits or JVD. Lungs:  Resp regular and unlabored, CTA. Heart: irregularly irregular rhythm. Soft systolic ejection murmur across prosthetic aortic valve.  No diastolic murmur. Abdomen: Soft, non-tender, non-distended, BS + x 4.  Extremities: No clubbing, cyanosis or edema. DP/PT/Radials 2+ and equal bilaterally.  tenosynovitis of finger right hand wrapped in dressing  Accessory Clinical Findings  CBC  Recent Labs  08/01/14 0036 08/02/14 0002  WBC 16.3* 15.8*  HGB 11.4* 11.3*  HCT 35.2* 34.6*  MCV 85.2 85.4  PLT 390 893*   Basic Metabolic Panel  Recent Labs  08/01/14 0036 08/02/14 0002  NA 128* 132*  K 3.9 4.2  CL 94* 95*  CO2 25 26  GLUCOSE 202* 232*  BUN 8 12  CREATININE 0.65 0.76  CALCIUM 7.9* 7.8*   Liver Function  Tests  Recent Labs  07/31/14 0330  AST 12  ALT 8  ALKPHOS 103  BILITOT 0.3  PROT 5.9*  ALBUMIN 2.1*   No results for input(s): LIPASE, AMYLASE in the last 72 hours. Cardiac Enzymes No results for input(s): CKTOTAL, CKMB, CKMBINDEX, TROPONINI in the last 72 hours. BNP Invalid input(s): POCBNP D-Dimer No results for input(s): DDIMER in the last 72 hours. Hemoglobin A1C No results for input(s): HGBA1C in the last 72 hours. Fasting Lipid Panel No results for input(s): CHOL, HDL, LDLCALC, TRIG, CHOLHDL, LDLDIRECT in the last 72 hours. Thyroid Function Tests No results for input(s): TSH, T4TOTAL, T3FREE, THYROIDAB in the last 72 hours.  Invalid input(s): FREET3  TELE  Atrial fibrillation with controlled ventricular response  ECG  pending  Radiology/Studies  Dg Chest Port 1 View  07/25/2014   CLINICAL DATA:  Short of breath. History of  cardiomyopathy and aortic valve replacement. History of hypertension.  EXAM: PORTABLE CHEST - 1 VIEW  COMPARISON:  07/23/2014.  FINDINGS: Changes from cardiac surgery are stable. Cardiac silhouette is normal in overall size and configuration. No mediastinal or hilar masses.  Stable elevation the right hemidiaphragm.  Lungs are clear.  No pleural effusion or pneumothorax.  IMPRESSION: No acute cardiopulmonary disease.   Electronically Signed   By: Lajean Manes M.D.   On: 07/25/2014 17:46   Dg Chest Port 1 View  07/23/2014   CLINICAL DATA:  Hypoxia.  EXAM: PORTABLE CHEST - 1 VIEW  COMPARISON:  07/21/2014  FINDINGS: Patient has had median sternotomy. Heart is enlarged. There is mild interstitial edema. No focal consolidations or pleural effusions are identified. Elevation of the right hemidiaphragm is stable.  IMPRESSION: Cardiomegaly and mild edema.   Electronically Signed   By: Shon Hale M.D.   On: 07/23/2014 08:04   Dg Chest Port 1 View  07/21/2014   CLINICAL DATA:  68 year old male presenting with acute onset of chest pain.  EXAM: PORTABLE CHEST - 1 VIEW  COMPARISON:  Chest x-ray 10/03/2010.  FINDINGS: Lung volumes are low. Chronic elevation of the right hemidiaphragm is unchanged. No consolidative airspace disease. No pleural effusions. No pneumothorax. No pulmonary nodule or mass noted. Pulmonary vasculature and the cardiomediastinal silhouette are within normal limits. Status post median sternotomy.  IMPRESSION: 1. Low lung volumes without radiographic evidence of acute cardiopulmonary disease.   Electronically Signed   By: Vinnie Langton M.D.   On: 07/21/2014 10:23    ASSESSMENT AND PLAN 1. A-fib. TEE with LAA thrombus. Will need to be on Eliquis for 1 month, then repeat TEE to ensure resolution of thrombus,then cardioversion if TEE normal.  2. H/O aortic valve replacement with tissue graft normal gradient and no SBE on TEE on 07/23/2014  3. Cardiomyopathy Appears euvolemic   4.  Diabetic ketoacidosis - resolved, managed by hospitalist service, started on Lantus, will need to be discharged on it   5. Tenosynovitis of finger Post amputation [name] to see Monday on antibiotics  6. Skin care consult per nurse for rectal drainage Patient has rectal abscess Draining Dr Redmond Pulling has seen 7. AVR no evidence of SBE on TEE Normal gradients   Anticipate possible discharge home in the next day or 2.  Stable from cardiac standpoint.  Signed, Darlin Coco MD

## 2014-08-02 NOTE — Progress Notes (Signed)
Thomas Wright 1 - Stepdown / ICU Progress Note  MARSALIS BEAULIEU [id number] DOB: 03/09/1946 DOA: 07/21/2014 PCP: [name], MD  Brief narrative: 68 yo M with PMH of DM, HTN, Afib, h/o bioprosthetic valve replacement in 2008, h/o tenosynovitis and I&D in 8/15 who presented to the ER with generalized malaise, and chest pain, and was found to have Afib with RVR.  Cards admitted him.    He had not been taking his oral hypoglycemics for months - his CBGs had been running in 300s-400s for weeks.  His CBGs have become much better controlled while hospitalized with the use of long-acting insulin. Since plans are to discharge home on insulin Glucotrol was discontinued on 11/9. Pre authorization was completed for Lantus Solostar flexpen and patient was approved; email printed and placed on shadow chart.   Has had leukocytosis since admission. Has been experiencing rectal pain presumed to be hemorrhoidal but developed redness and spontaneous drainage c/w peri rectal abscess. Empiric anbx's started and surgery consulted. Since area spontaneously draining no indications for incision and drainage.  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 Pt has recommended HH PT.  HPI/Subjective: No complaints. Trying to complete paperwork for ST disability.  Reports drainage from gluteal wound has ceased.    Assessment/Plan:  Diabetic ketoacidosis / diabetes mellitus Suspect initial DKA driven by smouldering perirectal abscess - Hemoglobin A1c > 11 - now on Lantus - will need insulin at discharge - diabetes educator consulted - pre authorization for Lantus Flexpen completed 11/6 and email response confirming approval printed and placed in shadow chart - cont to adjust medical tx as CBG still not yet at goal - needs ACE as soon as BP improved (currently marginally low at times)  Parox Atrial fibrillation with RVR Per Cardiology - persistent tachycardia now mostly  resolved after increased dose of cardizem  LAA Thrombus unable to proceed with DCCV at this time - Eliquis per Cards-copay $45  Dehydration Resolved - Due to DKA   Acute respiratory failure with hypoxia due to progressive systolic dysfunction EF 57-84% EF in March 45-50% with associated grade 2 diastolic dysfunction - echo this admission demonstrated worsened EF now down to 25% likely mediated by persistent tachycardia - weaned off oxygen - euvolemic off Lasix  Peri rectal abscess / Hemorrhoids  developed foul smelling, yellow/tan drainage spontaneously from left buttock near rectum c/w peri rectal abscess - also has external hemorrhoids on exam - cont Anusol HCL - Surgery noted no indication to pursue I&D at this time - wound cx positive for Klebsiella sensitive to Zosyn - transition to oral abx in AM for d/c -have asked for [facility] RN to help follow to healing; also has limited mobility due to recent finger surgery  Hypertension BP currently well controlled   H/O aortic valve replacement with tissue graft no SBE on TEE 07/23/2014  Tenosynovitis of finger Now s/p amputation - doing well with no signif pain in hand   Patient non adherence Attending MD had discussed at length with patient 11/5 - okay with plans to transition to insulin at discharge - agrees diabetes education consult would be beneficial  DVT prophylaxis: SCDs Code Status: FULL Disposition: SDU due to persistent tachycardia -hopeful for dc in next 24 hours  Procedures: 11/6 TEE - EF 25-30% - calcified thrombus in LAA 11/12 - right long finger P-2 level amputation under local   Consults: Cardiology  Hand Surgery   Antibiotics: Unasyn 11/20> 11/11 Flagyl 11/10 > 11/11  Zosyn 11/11 >11/15 Ceftin 11/15>  Objective: Blood pressure 128/78, pulse 108, temperature 97.9 F (36.6 C), temperature source Oral, resp. rate 27, height 6' (1.829 m), weight 247 lb 9.2 oz (112.3 kg), SpO2 98 %.  Intake/Output Summary (Last 24  hours) at 08/02/14 1244 Last data filed at 08/02/14 1038  Gross per 24 hour  Intake    480 ml  Output   3003 ml  Net  -2523 ml   Exam: Gen: No acute respiratory distress - alert and conversant  Chest: CTA th/o w/o crackles or wheeze Cardiac: Irregular, slightly tachycardic rate and rhythm, no rubs murmurs or gallops Abdomen: Soft nontender nondistended without obvious hepatosplenomegaly, no ascites Extremities: no signif cyanosis or clubbing - R hand dressed and dry   Scheduled Meds:  Scheduled Meds: . amiodarone  200 mg Oral BID  . antiseptic oral rinse  7 mL Mouth Rinse BID  . apixaban  5 mg Oral BID  . aspirin EC  81 mg Oral Daily  . atorvastatin  10 mg Oral q morning - 10a  . carvedilol  37.5 mg Oral BID WC  . cefUROXime  500 mg Oral BID WC  . diltiazem  240 mg Oral Daily  . docusate sodium  100 mg Oral BID  . hydrocortisone  25 mg Rectal BID  . insulin aspart  0-20 Units Subcutaneous TID WC  . insulin aspart  8 Units Subcutaneous TID WC  . insulin glargine  44 Units Subcutaneous QHS  . polyethylene glycol  17 g Oral Daily    Data Reviewed: Basic Metabolic Panel:  Recent Labs Lab 07/28/14 0320 07/29/14 0320 07/30/14 0325 07/31/14 0330 08/01/14 0036 08/02/14 0002  NA 130* 131* 136* 131* 128* 132*  K 4.1 4.1 3.7 3.8 3.9 4.2  CL 93* 94* 99 94* 94* 95*  CO2 26 25 28 25 25 26   GLUCOSE 110* 209* 178* 132* 202* 232*  BUN 12 10 9 9 8 12   CREATININE 0.63 0.58 0.65 0.68 0.65 0.76  CALCIUM 8.1* 7.9* 8.0* 7.9* 7.9* 7.8*  MG 1.8  --   --   --   --   --      Liver Function Tests:  Recent Labs Lab 07/28/14 0320 07/29/14 0320 07/31/14 0330  AST 17 14 12   ALT 16 13 8   ALKPHOS 139* 136* 103  BILITOT 0.4 0.4 0.3  PROT 6.1 5.8* 5.9*  ALBUMIN 2.2* 2.1* 2.1*   CBC:  Recent Labs Lab 07/28/14 0320 07/29/14 0320 07/30/14 0325 07/31/14 0330 08/01/14 0036 08/02/14 0002  WBC 24.2* 23.3* 18.5* 16.8* 16.3* 15.8*  NEUTROABS 19.4*  --   --   --   --   --   HGB  11.8* 11.8* 11.6* 12.2* 11.4* 11.3*  HCT 35.1* 34.9* 34.3* 37.1* 35.2* 34.6*  MCV 83.2 81.9 82.9 85.1 85.2 85.4  PLT 360 344 362 420* 390 414*   CBG:  Recent Labs Lab 08/01/14 0857 08/01/14 1209 08/01/14 1618 08/01/14 2156 08/02/14 0833  GLUCAP 171* 233* 176* 244* 107*    Recent Results (from the past 240 hour(s))  Wound culture     Status: None   Collection Time: 07/27/14  9:13 AM  Result Value Ref Range Status   Specimen Description WOUND BUTTOCKS  Final   Special Requests Normal  Final   Gram Stain   Final    FEW WBC PRESENT,BOTH PMN AND MONONUCLEAR NO SQUAMOUS EPITHELIAL CELLS SEEN FEW GRAM NEGATIVE RODS Performed at [employer] [facility]  Final    MODERATE KLEBSIELLA PNEUMONIAE Performed at [facility]    Report Status 07/29/2014 FINAL  Final   Organism ID, Bacteria KLEBSIELLA PNEUMONIAE  Final      Susceptibility   Klebsiella pneumoniae - MIC*    AMPICILLIN >=32 RESISTANT Resistant     AMPICILLIN/SULBACTAM 4 SENSITIVE Sensitive     CEFAZOLIN <=4 SENSITIVE Sensitive     CEFEPIME <=1 SENSITIVE Sensitive     CEFTAZIDIME <=1 SENSITIVE Sensitive     CEFTRIAXONE <=1 SENSITIVE Sensitive     CIPROFLOXACIN <=0.25 SENSITIVE Sensitive     GENTAMICIN <=1 SENSITIVE Sensitive     IMIPENEM <=0.25 SENSITIVE Sensitive     PIP/TAZO <=4 SENSITIVE Sensitive     TOBRAMYCIN <=1 SENSITIVE Sensitive     TRIMETH/SULFA <=20 SENSITIVE Sensitive     * MODERATE KLEBSIELLA PNEUMONIAE  Urine culture     Status: None   Collection Time: 07/28/14  5:39 PM  Result Value Ref Range Status   Specimen Description URINE, RANDOM  Final   Special Requests NONE  Final   Culture  Setup Time   Final    07/28/2014 18:16 Performed at [place]   Final    >=100,000 COLONIES/ML Performed at [facility]    Culture   Final    Multiple bacterial morphotypes present, none predominant. Suggest appropriate recollection if clinically  indicated. Performed at [facility]    Report Status 07/29/2014 FINAL  Final  Wound culture     Status: None   Collection Time: 07/29/14  2:58 PM  Result Value Ref Range Status   Specimen Description WOUND FINGER RIGHT  Final   Special Requests NONE  Final   Gram Stain   Final    MODERATE WBC PRESENT,BOTH PMN AND MONONUCLEAR NO SQUAMOUS EPITHELIAL CELLS SEEN FEW GRAM POSITIVE COCCI IN PAIRS RARE GRAM NEGATIVE RODS Performed at [facility]    Culture   Final    MODERATE STAPHYLOCOCCUS AUREUS Note: RIFAMPIN AND GENTAMICIN SHOULD NOT BE USED AS SINGLE DRUGS FOR TREATMENT OF STAPH INFECTIONS. Performed at [facility]    Report Status 08/01/2014 FINAL  Final   Organism ID, Bacteria STAPHYLOCOCCUS AUREUS  Final      Susceptibility   Staphylococcus aureus - MIC*    CLINDAMYCIN >=8 RESISTANT Resistant     ERYTHROMYCIN >=8 RESISTANT Resistant     GENTAMICIN <=0.5 SENSITIVE Sensitive     LEVOFLOXACIN <=0.12 SENSITIVE Sensitive     OXACILLIN 1 SENSITIVE Sensitive     PENICILLIN >=0.5 RESISTANT Resistant     RIFAMPIN <=0.5 SENSITIVE Sensitive     TRIMETH/SULFA <=10 SENSITIVE Sensitive     VANCOMYCIN <=0.5 SENSITIVE Sensitive     TETRACYCLINE >=16 RESISTANT Resistant     MOXIFLOXACIN <=0.25 SENSITIVE Sensitive     * MODERATE STAPHYLOCOCCUS AUREUS  Anaerobic culture     Status: None (Preliminary result)   Collection Time: 07/29/14  2:58 PM  Result Value Ref Range Status   Specimen Description WOUND FINGER RIGHT  Final   Special Requests NONE  Final   Gram Stain   Final    MODERATE WBC PRESENT,BOTH PMN AND MONONUCLEAR NO SQUAMOUS EPITHELIAL CELLS SEEN FEW GRAM POSITIVE COCCI IN PAIRS RARE GRAM NEGATIVE RODS Performed at [facility]    Culture   Final    NO ANAEROBES ISOLATED; CULTURE IN PROGRESS FOR 5 DAYS Performed at [facility]    Report Status PENDING  Incomplete  Clostridium Difficile by PCR     Status: None   Collection Time:  07/31/14  3:59 PM  Result Value Ref Range Status   C difficile by pcr NEGATIVE NEGATIVE Final     Studies:  Recent x-ray studies have been reviewed in detail by the Attending Physician  Time spent :  25 mins  Erin Hearing, ANP [facility] For Consults/Admissions - Flow Manager - [phone] Office  [phone] Pager [phone]  On-Call/Text Page:      [email or web address]      password [facility]  08/02/2014, 12:44 PM   LOS: 12 days   I have personally examined this patient and reviewed the entire database. I have reviewed the above note, made any necessary editorial changes, and agree with its content.  Cherene Altes, MD [facility]

## 2014-08-03 ENCOUNTER — Inpatient Hospital Stay (HOSPITAL_COMMUNITY): Payer: Medicare HMO

## 2014-08-03 DIAGNOSIS — R9431 Abnormal electrocardiogram [ECG] [EKG]: Secondary | ICD-10-CM

## 2014-08-03 DIAGNOSIS — E162 Hypoglycemia, unspecified: Secondary | ICD-10-CM

## 2014-08-03 DIAGNOSIS — J810 Acute pulmonary edema: Secondary | ICD-10-CM | POA: Insufficient documentation

## 2014-08-03 LAB — TROPONIN I

## 2014-08-03 LAB — BASIC METABOLIC PANEL
ANION GAP: 10 (ref 5–15)
BUN: 11 mg/dL (ref 6–23)
CHLORIDE: 95 meq/L — AB (ref 96–112)
CO2: 26 meq/L (ref 19–32)
Calcium: 8.2 mg/dL — ABNORMAL LOW (ref 8.4–10.5)
Creatinine, Ser: 0.63 mg/dL (ref 0.50–1.35)
GFR calc Af Amer: >90 mL/min (ref >90)
GFR calc non Af Amer: >90 mL/min (ref >90)
GLUCOSE: 83 mg/dL (ref 70–99)
POTASSIUM: 4.1 meq/L (ref 3.7–5.3)
SODIUM: 131 meq/L — AB (ref 137–147)

## 2014-08-03 LAB — BLOOD GAS, ARTERIAL
Acid-Base Excess: 3.3 mmol/L — ABNORMAL HIGH (ref 0.0–2.0)
Bicarbonate: 27.3 mEq/L — ABNORMAL HIGH (ref 20.0–24.0)
DRAWN BY: 252031
O2 CONTENT: 3 L/min
O2 Saturation: 91.5 %
PCO2 ART: 42.1 mmHg (ref 35.0–45.0)
PH ART: 7.429 (ref 7.350–7.450)
Patient temperature: 98.6
TCO2: 28.6 mmol/L (ref 0–100)
pO2, Arterial: 62.1 mmHg — ABNORMAL LOW (ref 80.0–100.0)

## 2014-08-03 LAB — ANAEROBIC CULTURE

## 2014-08-03 LAB — COMPREHENSIVE METABOLIC PANEL
ALT: 9 U/L (ref 0–53)
AST: 14 U/L (ref 0–37)
Albumin: 2.3 g/dL — ABNORMAL LOW (ref 3.5–5.2)
Alkaline Phosphatase: 110 U/L (ref 39–117)
Anion gap: 9 (ref 5–15)
BUN: 12 mg/dL (ref 6–23)
CO2: 26 mEq/L (ref 19–32)
Calcium: 8.2 mg/dL — ABNORMAL LOW (ref 8.4–10.5)
Chloride: 92 mEq/L — ABNORMAL LOW (ref 96–112)
Creatinine, Ser: 0.65 mg/dL (ref 0.50–1.35)
GFR calc non Af Amer: >90 mL/min (ref >90)
GLUCOSE: 169 mg/dL — AB (ref 70–99)
Potassium: 4 mEq/L (ref 3.7–5.3)
Sodium: 127 mEq/L — ABNORMAL LOW (ref 137–147)
TOTAL PROTEIN: 6.4 g/dL (ref 6.0–8.3)
Total Bilirubin: 0.3 mg/dL (ref 0.3–1.2)

## 2014-08-03 LAB — GLUCOSE, CAPILLARY
GLUCOSE-CAPILLARY: 92 mg/dL (ref 70–99)
Glucose-Capillary: 83 mg/dL (ref 70–99)
Glucose-Capillary: 178 mg/dL — ABNORMAL HIGH (ref 70–99)
Glucose-Capillary: 158 mg/dL — ABNORMAL HIGH (ref 70–99)
Glucose-Capillary: 82 mg/dL (ref 70–99)
Glucose-Capillary: 132 mg/dL — ABNORMAL HIGH (ref 70–99)
Glucose-Capillary: 63 mg/dL — ABNORMAL LOW (ref 70–99)

## 2014-08-03 LAB — CBC WITH DIFFERENTIAL/PLATELET
Basophils Absolute: 0 10*3/uL (ref 0.0–0.1)
Basophils Relative: 0 % (ref 0–1)
EOS PCT: 2 % (ref 0–5)
Eosinophils Absolute: 0.2 10*3/uL (ref 0.0–0.7)
HCT: 33.5 % — ABNORMAL LOW (ref 39.0–52.0)
Hemoglobin: 11 g/dL — ABNORMAL LOW (ref 13.0–17.0)
LYMPHS ABS: 1.6 10*3/uL (ref 0.7–4.0)
LYMPHS PCT: 11 % — AB (ref 12–46)
MCH: 27.2 pg (ref 26.0–34.0)
MCHC: 32.8 g/dL (ref 30.0–36.0)
MCV: 82.7 fL (ref 78.0–100.0)
Monocytes Absolute: 0.9 10*3/uL (ref 0.1–1.0)
Monocytes Relative: 7 % (ref 3–12)
NEUTROS ABS: 11.4 10*3/uL — AB (ref 1.7–7.7)
NEUTROS PCT: 80 % — AB (ref 43–77)
PLATELETS: 385 10*3/uL (ref 150–400)
RBC: 4.05 MIL/uL — ABNORMAL LOW (ref 4.22–5.81)
RDW: 13 % (ref 11.5–15.5)
WBC: 14.2 10*3/uL — ABNORMAL HIGH (ref 4.0–10.5)

## 2014-08-03 LAB — CBC
HEMATOCRIT: 35.6 % — AB (ref 39.0–52.0)
Hemoglobin: 11.5 g/dL — ABNORMAL LOW (ref 13.0–17.0)
MCH: 26.9 pg (ref 26.0–34.0)
MCHC: 32.3 g/dL (ref 30.0–36.0)
MCV: 83.4 fL (ref 78.0–100.0)
Platelets: 388 10*3/uL (ref 150–400)
RBC: 4.27 MIL/uL (ref 4.22–5.81)
RDW: 13.2 % (ref 11.5–15.5)
WBC: 15.3 10*3/uL — AB (ref 4.0–10.5)

## 2014-08-03 LAB — MAGNESIUM: Magnesium: 2.2 mg/dL (ref 1.5–2.5)

## 2014-08-03 MED ORDER — INSULIN PEN NEEDLE 31G X 8 MM MISC: Status: DC

## 2014-08-03 MED ORDER — OXYCODONE HCL 5 MG PO TABS: 5.0000 mg | ORAL_TABLET | Freq: Four times a day (QID) | ORAL | Status: DC | PRN

## 2014-08-03 MED ORDER — DEXTROSE 50 % IV SOLN
1.0000 | Freq: Once | INTRAVENOUS | Status: AC
  Administered 2014-08-03: 50 mL via INTRAVENOUS

## 2014-08-03 MED ORDER — ALPRAZOLAM 0.25 MG PO TABS
0.2500 mg | ORAL_TABLET | Freq: Once | ORAL | Status: AC
  Administered 2014-08-03: 0.25 mg via ORAL

## 2014-08-03 MED ORDER — CEFUROXIME AXETIL 500 MG PO TABS: 500.0000 mg | ORAL_TABLET | Freq: Two times a day (BID) | ORAL | Status: DC

## 2014-08-03 MED ORDER — DEXTROSE 50 % IV SOLN
INTRAVENOUS | Status: AC
  Filled 2014-08-03: qty 50

## 2014-08-03 MED ORDER — INSULIN GLARGINE 100 UNIT/ML ~~LOC~~ SOLN: 44.0000 [IU] | Freq: Every day | SUBCUTANEOUS | Status: DC

## 2014-08-03 MED ORDER — CARVEDILOL 25 MG PO TABS: ORAL_TABLET | ORAL | Status: DC

## 2014-08-03 MED ORDER — LOSARTAN POTASSIUM 25 MG PO TABS
25.0000 mg | ORAL_TABLET | Freq: Every day | ORAL | Status: DC
  Administered 2014-08-03: 25 mg via ORAL
  Filled 2014-08-03 (×2): qty 1

## 2014-08-03 MED ORDER — MORPHINE SULFATE 2 MG/ML IJ SOLN
2.0000 mg | Freq: Once | INTRAMUSCULAR | Status: AC
  Administered 2014-08-03: 2 mg via INTRAVENOUS

## 2014-08-03 MED ORDER — INSULIN ASPART 100 UNIT/ML ~~LOC~~ SOLN: 8.0000 [IU] | Freq: Three times a day (TID) | SUBCUTANEOUS | Status: DC

## 2014-08-03 MED ORDER — DILTIAZEM HCL ER COATED BEADS 240 MG PO CP24: 240.0000 mg | ORAL_CAPSULE | Freq: Every day | ORAL | Status: DC

## 2014-08-03 MED ORDER — ALPRAZOLAM 0.25 MG PO TABS: 0.2500 mg | ORAL_TABLET | Freq: Three times a day (TID) | ORAL | Status: DC | PRN

## 2014-08-03 MED ORDER — FUROSEMIDE 10 MG/ML IJ SOLN
80.0000 mg | Freq: Once | INTRAMUSCULAR | Status: AC
  Administered 2014-08-03: 80 mg via INTRAVENOUS

## 2014-08-03 MED ORDER — APIXABAN 5 MG PO TABS: 5.0000 mg | ORAL_TABLET | Freq: Two times a day (BID) | ORAL | Status: DC

## 2014-08-03 MED ORDER — "INSULIN SYRINGE-NEEDLE U-100 30G X 5/16"" 0.3 ML MISC": Status: DC

## 2014-08-03 MED ORDER — POLYETHYLENE GLYCOL 3350 17 G PO PACK: 17.0000 g | PACK | Freq: Every day | ORAL | Status: DC

## 2014-08-03 MED ORDER — AMIODARONE HCL 200 MG PO TABS: 200.0000 mg | ORAL_TABLET | Freq: Two times a day (BID) | ORAL | Status: DC

## 2014-08-03 MED ORDER — FUROSEMIDE 10 MG/ML IJ SOLN
INTRAMUSCULAR | Status: AC
  Filled 2014-08-03: qty 8

## 2014-08-03 MED ORDER — MORPHINE SULFATE 2 MG/ML IJ SOLN
INTRAMUSCULAR | Status: AC
  Filled 2014-08-03: qty 1

## 2014-08-03 NOTE — Progress Notes (Addendum)
Patient complaining of not being able to breathe, sweating profusely, and feeling like "I'm going to pass out".  Upon assessment, his HR decreased to 58, RR up to 40, BP 148/75.  His O2 remains >90%. He continues to be diaphoretic and pale.  His lungs fields are clear and diminished and expiratory wheezes can be heard.  Dr Sherral Hammers paged and notified.  Will continue to monitor.

## 2014-08-03 NOTE — Discharge Instructions (Addendum)

## 2014-08-03 NOTE — Progress Notes (Signed)
Thomas Wright [id number]:[id number] DOB: Aug 30, 1946 DOA: 07/21/2014 PCP: [name], MD   Subj: received page at approximately 1850 prior to patient being actually discharged. Patient was having diffuse sweating, positive increased SOB, negative CP. Upon arrival to bedside patient extremely uncomfortable, positive diaphoretic, tachypnea, negative CP.    Obj: Objective: VITAL SIGNS: Temp: 98.1 F (36.7 C) (11/17 1532) Temp Source: Oral (11/17 1532) BP: 112/69 mmHg (11/17 1758) Pulse Rate: 61 (11/17 1758) SPO2; FIO2:   Intake/Output Summary (Last 24 hours) at 08/03/14 1957 Last data filed at 08/03/14 1901  Gross per 24 hour  Intake 987.16 ml  Output   2450 ml  Net -1462.84 ml     Exam: General: A/O 4, moderate-severe respiratory distress Lungs: tachypnea(35-45) LLL rhonchi, decreased breath sounds RML/RLL, without wheezes or crackles Cardiovascular: tachycardic, Regular rhythm without murmur gallop or rub normal S1 and S2 Abdomen: Nontender, nondistended, soft, bowel sounds positive, no rebound, no ascites, no appreciable mass Extremities: No significant cyanosis, clubbing, or edema bilateral lower extremities   Procedure/Significant Events:    Culture 11/4 MRSA by PCR negative 11/10 buttocks wound positive Klebsiella pneumoniae 11/12 wound right finger positive MSSA   Antibiotics: Unasyn 11/20> 11/11 Flagyl 11/10 > 11/11 Zosyn 11/11 >11/15 Ceftin 11/15>   EKG; when compared to EKG from 05/06/2014, patient has nonspecific ST T-wave changes in lead 3, aVF. Inverted/flattened T wave in lead V1 through V6; cannot rule out inferior septal MI  A/P Acute SOB - Flash pulmonary edema; PCXR, shows patient to be in flash pulmonary edema Lasix 80 mg IV 1 -Then Lasix 40 mg IV daily -MI? Vs demand ischemia obtain EKG, obtain troponin 3 -PE is an differential however patient is already anticoagulated on Eliquis 5 mg daily, secondary to his left atrial  thrombus -morphine 2 mg IV 1  Hypoglycemia -critical value call CBG 36 -Amp of D50  Abnormal EKG -Obtain troponin 3 -continue Coreg 37.5 mg BID -Continue Pacerone 200 mg BID -into the Cozaar 25 mg daily

## 2014-08-03 NOTE — Progress Notes (Signed)
Patient ambulated throughout unit with RN.  He used a front wheel walker and traveled 240 feet.  O2 saturations remained 98-100% while mobile.  Patient returned to bed.  He appears calm and stable at this time.  Will continue to monitor.

## 2014-08-03 NOTE — Progress Notes (Addendum)
Patient Name: Thomas Wright Date of Encounter: 08/03/2014     Principal Problem:   Diabetic ketoacidosis Active Problems:   Cardiomyopathy   H/O aortic valve replacement with tissue graft   Atrial fibrillation with RVR   Hypertension   Tenosynovitis of finger   Dehydration   Patient non adherence   Leukocytosis   LA thrombus   Acute on chronic respiratory failure with hypoxia   Systolic CHF, acute on chronic   Left atrial thrombus   Perirectal abscess   Other hemorrhoids   Noncompliance with medication regimen   Type 2 diabetes mellitus with ketoacidosis without coma   Chest pain    SUBJECTIVE  No chest pain or dyspnea. No TIA symptoms.  Telemetry shows apparent NSR this morning. 12 lead EKG however shows that he is still in atrial flutter with 4:1 conduction.  CURRENT MEDS . amiodarone  200 mg Oral BID  . antiseptic oral rinse  7 mL Mouth Rinse BID  . apixaban  5 mg Oral BID  . aspirin EC  81 mg Oral Daily  . atorvastatin  10 mg Oral q morning - 10a  . carvedilol  37.5 mg Oral BID WC  . cefUROXime  500 mg Oral BID WC  . diltiazem  240 mg Oral Daily  . docusate sodium  100 mg Oral BID  . hydrocortisone  25 mg Rectal BID  . insulin aspart  0-20 Units Subcutaneous TID WC  . insulin aspart  8 Units Subcutaneous TID WC  . insulin glargine  44 Units Subcutaneous QHS  . polyethylene glycol  17 g Oral Daily    OBJECTIVE  Filed Vitals:   08/03/14 0438 08/03/14 0500 08/03/14 0523 08/03/14 0803  BP: 132/49   146/82  Pulse: 67 71 89 81  Temp: 98.2 F (36.8 C)   97.4 F (36.3 C)  TempSrc: Oral   Oral  Resp: 20 21 18 17   Height:      Weight:   251 lb 8.7 oz (114.1 kg)   SpO2: 95% 92% 90% 97%    Intake/Output Summary (Last 24 hours) at 08/03/14 1125 Last data filed at 08/03/14 0804  Gross per 24 hour  Intake 907.16 ml  Output   1925 ml  Net -1017.84 ml   Filed Weights   08/01/14 0300 08/02/14 0410 08/03/14 0523  Weight: 242 lb 8 oz (109.997 kg) 247 lb  9.2 oz (112.3 kg) 251 lb 8.7 oz (114.1 kg)    PHYSICAL EXAM  General: Pleasant, NAD. Neuro: Alert and oriented X 3. Moves all extremities spontaneously. Psych: Normal affect. HEENT: Normal Neck: Supple without bruits or JVD. Lungs: Resp regular and unlabored, CTA. Heart: Regular rhythm. Soft systolic ejection murmur across prosthetic aortic valve. No diastolic murmur. Abdomen: Soft, non-tender, non-distended, BS + x 4.  Extremities: No clubbing, cyanosis or edema. DP/PT/Radials 2+ and equal bilaterally. tenosynovitis of finger right hand wrapped in dressing  Accessory Clinical Findings  CBC  Recent Labs  08/02/14 0002 08/03/14 0347  WBC 15.8* 15.3*  HGB 11.3* 11.5*  HCT 34.6* 35.6*  MCV 85.4 83.4  PLT 414* 440   Basic Metabolic Panel  Recent Labs  08/02/14 0002 08/03/14 0347  NA 132* 131*  K 4.2 4.1  CL 95* 95*  CO2 26 26  GLUCOSE 232* 83  BUN 12 11  CREATININE 0.76 0.63  CALCIUM 7.8* 8.2*   Liver Function Tests No results for input(s): AST, ALT, ALKPHOS, BILITOT, PROT, ALBUMIN in the last 72 hours. No  results for input(s): LIPASE, AMYLASE in the last 72 hours. Cardiac Enzymes No results for input(s): CKTOTAL, CKMB, CKMBINDEX, TROPONINI in the last 72 hours. BNP Invalid input(s): POCBNP D-Dimer No results for input(s): DDIMER in the last 72 hours. Hemoglobin A1C No results for input(s): HGBA1C in the last 72 hours. Fasting Lipid Panel No results for input(s): CHOL, HDL, LDLCALC, TRIG, CHOLHDL, LDLDIRECT in the last 72 hours. Thyroid Function Tests No results for input(s): TSH, T4TOTAL, T3FREE, THYROIDAB in the last 72 hours.  Invalid input(s): FREET3  TELE  NSR.  ECG  Atrial flutter with 4:1 conduction  Radiology/Studies  Dg Chest Port 1 View  07/25/2014   CLINICAL DATA:  Short of breath. History of cardiomyopathy and aortic valve replacement. History of hypertension.  EXAM: PORTABLE CHEST - 1 VIEW  COMPARISON:  07/23/2014.   FINDINGS: Changes from cardiac surgery are stable. Cardiac silhouette is normal in overall size and configuration. No mediastinal or hilar masses.  Stable elevation the right hemidiaphragm.  Lungs are clear.  No pleural effusion or pneumothorax.  IMPRESSION: No acute cardiopulmonary disease.   Electronically Signed   By: Lajean Manes M.D.   On: 07/25/2014 17:46   Dg Chest Port 1 View  07/23/2014   CLINICAL DATA:  Hypoxia.  EXAM: PORTABLE CHEST - 1 VIEW  COMPARISON:  07/21/2014  FINDINGS: Patient has had median sternotomy. Heart is enlarged. There is mild interstitial edema. No focal consolidations or pleural effusions are identified. Elevation of the right hemidiaphragm is stable.  IMPRESSION: Cardiomegaly and mild edema.   Electronically Signed   By: Shon Hale M.D.   On: 07/23/2014 08:04   Dg Chest Port 1 View  07/21/2014   CLINICAL DATA:  68 year old male presenting with acute onset of chest pain.  EXAM: PORTABLE CHEST - 1 VIEW  COMPARISON:  Chest x-ray 10/03/2010.  FINDINGS: Lung volumes are low. Chronic elevation of the right hemidiaphragm is unchanged. No consolidative airspace disease. No pleural effusions. No pneumothorax. No pulmonary nodule or mass noted. Pulmonary vasculature and the cardiomediastinal silhouette are within normal limits. Status post median sternotomy.  IMPRESSION: 1. Low lung volumes without radiographic evidence of acute cardiopulmonary disease.   Electronically Signed   By: Vinnie Langton M.D.   On: 07/21/2014 10:23    ASSESSMENT AND PLAN 1. A-fib. TEE with LAA thrombus. Has converted to atrial flutter with 4:1 AV conduction. Continue Eliquis.  2. H/O aortic valve replacement with tissue graft normal gradient and no SBE on TEE on 07/23/2014  3. Cardiomyopathy.  EF25-30%. Will continue carvedilol, stop diltiazem.Add ARB. Appears euvolemic   4. Diabetic ketoacidosis - resolved, managed by hospitalist service, started on Lantus, will need to be discharged on it    5. Tenosynovitis of finger Post amputation [name] to see Monday on antibiotics  6. Skin care consult per nurse for rectal drainage Patient has rectal abscess Draining Dr Redmond Pulling has seen 7. AVR no evidence of SBE on TEE Normal gradients   .   Signed, Darlin Coco MD

## 2014-08-03 NOTE — Progress Notes (Addendum)
Dr. Sherral Hammers was on unit til about 10pm writing note/looking at pt. Pt's rectal temp 91.1, Bair hugger applied. Likely secondary to hypoglycemia. Sugars are coming up with food and D50 x 2.  Can not start Dextrose IVF secondary to pulmonary edema. Foley placed and 80mg  Lasix given. Output is still pending at time of this note.  Given episode earlier, CTA chest ordered to r/o PE. He has a known LAA thrombus treated with Eliquis per cardio. If he has a PE, then will need higher dose of Eliquis or Heparin in hospital.  I don't see any ST changes on tele, but EKG with questionable changes. First troponin neg. Will follow those. No active CP.  ABG showed low PO2, he was satting in the upper 80s during event. O2 changed to 50% ventimask and saturations up to the 90s now. He has no increased WOB or resp distress at present. He is alert and aware, eating a sandwich at last check. Will r/p EKG around 1am and may consider r/p ABG in a few hours.   Monitor sugars q 2 tonight and hold basal and SSI for now. Follow rectal temp closely as well. BP stable. Will watch closely. Low threshold for ICU/critical care consult if decompensates or becomes more unstable. Clance Boll, NP [facility]  Update: CTA neg for PE but noted pleural effusions right and left. May need Pulmonary consult this am. Will continue Eliquis at 5mg  po bid. Troponins x 2 neg so far. Continues to oxygenate well on 50% ventimask. Has had over 2500cc of urine output since Lasix. Sugars are much better and temp is coming up.  Update: PO2 up to 84 now on ABG.  Update: sugars have come up nicely. Lantus was held last night. Cover once now with 4U Novolog and will follow. May need less insulin at home when d/c'd. Temp has come up as well.  KJKG, NP [facility]

## 2014-08-03 NOTE — Discharge Summary (Addendum)
Physician Discharge Summary  Thomas Wright BWG:[id number] DOB: 06-20-1946 DOA: 07/21/2014  PCP: [name], MD  Admit date: 07/21/2014 Discharge date: 08/07/2014  Time spent: 30 minutes  Recommendations for Outpatient Follow-up:  1. Follow up with Dr. Burney Gauze as scheduled 2. Follow up with Cardiology as scheduled. Eventually will need to be scheduled for DCCV 3. HH PT and RN ordered (has physical deconditioning and a draining perirectal abscess) 4. ST disability form completed prior to discharge and faxed to [place] (fax: [phone]; phone: [phone]) 5. 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   Patient non adherence   LA thrombus (new)   Acute respiratory failure with hypoxia-resolved   Other hemorrhoids   Chest pain   Discharge Condition: stable  Diet recommendation: Carbohydrate modified, heart healthy  Filed Weights   08/05/14 1514 08/06/14 0444 08/07/14 0455  Weight: 235 lb 1.6 oz (106.641 kg) 232 lb 9.6 oz (105.507 kg) 227 lb 8 oz (103.193 kg)    History of present illness:  68 yo M with PMH of DM, HTN, Afib, h/o bioprosthetic valve replacement in 2008, h/o tenosynovitis and I&D in 8/15 who presented to the ER with generalized malaise, and chest pain, and was found to have Afib with RVR. Cards admitted him.   He had not been taking his oral hypoglycemics for months - his CBGs had been running in 300s-400s for weeks.  His CBGs have become much better  controlled while hospitalized with the use of long-acting insulin. Since plans are to discharge home on insulin Glucotrol was discontinued on 11/9. Pre authorization was completed for Lantus Solostar flexpen and patient was approved; email printed and placed on shadow chart.   Has had leukocytosis since admission. Has been experiencing rectal pain presumed to be hemorrhoidal but developed redness and spontaneous drainage c/w peri rectal abscess. Empiric anbx's started and surgery consulted. Since area spontaneously draining no indications for incision and drainage.  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 Pt has recommended HH PT.  Hospital Course:   Diabetic ketoacidosis / diabetes mellitus Suspect initial DKA driven by smouldering perirectal abscess - Hemoglobin A1c > 11. Started on Lantus this admission and will discharge on this and Novolog for meal coverage. Diabetes educator consulted this admission. Pre authorization for Lantus Flexpen completed 11/6 and email response confirming approval printed and placed in shadow chart. Will need ACE as soon as BP improved (currently marginally low at times).On 11/17 and after episode of flash pulmonary edema he developed significant hypoglycemia and hypothermia resulting in adjustment in Lantus and Novolog insulin.  He was sent home on Lantus 25 units in the evening and Novolog 6 units with meals.   Paroxsysmal Atrial fibrillation with RVR Primary treatment per Cardiology. Had persistent tachycardia which has now resolved after increased dose of Cardizem. After episode of flash pilmonary edema Cardiology dc'd Cardizem in favor of Coreg and Amiodarone. Plan OP DCCV once has been anticoagulated at least 4 weeks.  LAA Thrombus Unable to proceed with DCCV due to thrombus.Eliquis begun per Cards-copay $45  Dehydration Resolved - Due to DKA   Acute respiratory failure with hypoxia due to progressive systolic dysfunction EF  14-43% EF in March 45-50% with associated grade 2 diastolic dysfunction - echo this admission demonstrated worsened EF now down to 25% likely mediated by persistent tachycardia- euvolemic off Lasix. Had remained stable off oxygen but just prior to discharge (11/17) pt developed flash pulmonary edema and discharge was cancelled. He was diuresed aggressively and was able to be weaned from VM to RA. He will dc home on Lasix 40 mg daily.  Peri rectal abscess / Hemorrhoids  Developed foul smelling, yellow/tan drainage spontaneously from left buttock near rectum c/w peri rectal abscess - also had external hemorrhoids on exam; treated with Anusol HCL suppositories. Surgery documented no indication to pursue I&D since wound was spontaneously draining with subsequent improvement in exam and leukocytosis. Wound cx positive for Klebsiella sensitive to Zosyn. Completed 10 days of antibiotics on 11/19. Have asked for [facility] RN to help follow to healing; also has limited mobility due to recent finger surgery  Hypertension BP currently well controlled   H/O aortic valve replacement with tissue graft no SBE on TEE 07/23/2014  Tenosynovitis of finger Now s/p amputation - doing well with no signif pain in hand   Patient non adherence Attending MD had discussed at length with patient 11/5 - okay with plans to transition to insulin at discharge - agrees diabetes education consult would be beneficial  Procedures: 11/6 TEE - EF 25-30% - calcified thrombus in LAA 11/12 - right long finger P-2 level amputation under local   Consultations: Cardiology /Dr. Mare Ferrari Hand Surgery / Dr Burney Gauze  Discharge Exam: Filed Vitals:   08/07/14 1423  BP: 114/69  Pulse: 60  Temp: 98.3 F (36.8 C)  Resp: 20   Gen: No acute respiratory distress - alert and conversant  Chest: CTA th/o w/o crackles or wheeze Cardiac: Irregular, slightly tachycardic rate and rhythm, no rubs murmurs or gallops Abdomen: Soft nontender  nondistended without obvious hepatosplenomegaly, no ascites Rectal: still with moderate drainage from perirectal site Extremities: no signif cyanosis or clubbing - R hand dressed and dry   Discharge Instructions You were cared for by a hospitalist during your hospital stay. If you have any questions about your discharge medications or the care you received while you were in the hospital after you are discharged, you can call the unit and asked to speak with the hospitalist on call if the hospitalist that took care of you is not available. Once you are discharged, your primary care physician will handle any further medical issues. Please note that NO REFILLS for any discharge medications will be authorized once you are discharged, as it is imperative that you return to your primary care physician (or establish a relationship with a primary care physician if you do not have one) for your aftercare needs so that they can reassess your need for medications and monitor your lab values.   Discharge Medication List as of 08/07/2014  4:34 PM    START taking these medications   Details  ALPRAZolam (XANAX) 0.25 MG tablet Take 1 tablet (0.25 mg total) by mouth 3 (three) times daily as needed for anxiety., Starting 08/03/2014, Until Discontinued, Print    apixaban (ELIQUIS) 5 MG TABS tablet Take 1 tablet (5 mg total) by mouth 2 (two) times daily., Starting 08/03/2014, Until Discontinued, Print    insulin aspart (NOVOLOG) 100 UNIT/ML injection Inject 6 Units into the skin 3 (three) times daily with meals.,  Starting 08/07/2014, Until Discontinued, Print    Insulin Pen Needle (RELION PEN NEEDLE 31G/8MM) 31G X 8 MM MISC Use with Lantus solostar flexpen to administer insulin, Print    Insulin Syringe-Needle U-100 (RELION INSULIN SYR 0.3CC/30G) 30G X 5/16" 0.3 ML MISC Use with Novolog nsulin to administer meal coverage insulin, Print    polyethylene glycol (MIRALAX / GLYCOLAX) packet Take 17 g by mouth daily.,  Starting 08/03/2014, Until Discontinued, Print    amiodarone (PACERONE) 200 MG tablet Take 1 tablet (200 mg total) by mouth 2 (two) times daily., Starting 08/03/2014, Until Discontinued, Print    oxyCODONE (OXY IR/ROXICODONE) 5 MG immediate release tablet Take 1 tablet (5 mg total) by mouth every 6 (six) hours as needed for moderate pain., Starting 08/03/2014, Until Discontinued, Print      CONTINUE these medications which have CHANGED   Details  insulin glargine (LANTUS) 100 UNIT/ML injection Inject 0.25 mLs (25 Units total) into the skin at bedtime., Starting 08/05/2014, Until Discontinued, Print    carvedilol (COREG) 25 MG tablet Take 1 1/2 tablets twice daily, Print    furosemide (LASIX) 20 MG tablet TAKE TWO 20 MG TABLETS DAILY, Print    losartan (COZAAR) 50 MG tablet Take 0.5 tablets (25 mg total) by mouth daily., Starting 08/07/2014, Until Discontinued, Normal      CONTINUE these medications which have NOT CHANGED   Details  atorvastatin (LIPITOR) 10 MG tablet Take 10 mg by mouth every morning., Starting 06/07/2014, Until Discontinued, Historical Med      STOP taking these medications     aspirin 81 MG tablet      doxycycline (VIBRAMYCIN) 100 MG capsule      glipiZIDE (GLUCOTROL) 5 MG tablet      HYDROcodone-acetaminophen (NORCO/VICODIN) 5-325 MG per tablet      metFORMIN (GLUCOPHAGE) 1000 MG tablet        No Known Allergies Follow-up Information    Follow up with Schuyler Amor, MD On 08/09/2014.   Specialty:  Orthopedic Surgery   Why:  For wound re-check   Contact information:   [place] [address] [phone]       Follow up with [name], MD. Schedule an appointment as soon as possible for a visit in 1 week.   Specialty:  Family Medicine   Why:  hospital follow-up; DKA, perirectal abscess, tenosynovitis of finger   Contact information:   [place] [address]       Follow up with [facility].   Why:  Registered Nurse and Physical Therapy services to start within 24-48 hours of discharge    Contact information:   [address] [place] [address] [phone]       Follow up with [name], MD In 1 week.   Specialty:  Cardiology   Contact information:   [address] [place] [address] [phone]        The results of significant diagnostics from this hospitalization (including imaging, microbiology, ancillary and laboratory) are listed below for reference.    Significant Diagnostic Studies: Dg Chest 2 View  08/04/2014   CLINICAL DATA:  Atrial fibrillation, pulmonary edema  EXAM: CHEST  2 VIEW  COMPARISON:  08/03/2014 and 08/04/2014  FINDINGS: Cardiomediastinal silhouette is stable. Central mild vascular congestion without pulmonary edema. There is improvement in aeration. Small right pleural effusion with right basilar atelectasis or infiltrate. Trace left pleural effusion.  IMPRESSION: Central mild vascular congestion without  pulmonary edema. Improvement in aeration. Small right pleural effusion with right basilar atelectasis or infiltrate. Trace left pleural effusion PICC   Electronically Signed   By: Lahoma Crocker M.D.   On: 08/04/2014 12:04   Ct Angio Chest Pe W/cm &/or Wo Cm  08/04/2014   CLINICAL DATA:  Chest pain and shortness of breath  EXAM: CT ANGIOGRAPHY CHEST WITH CONTRAST  TECHNIQUE: Multidetector CT imaging of the chest was performed using the standard protocol during bolus administration of intravenous contrast. Multiplanar CT image reconstructions and MIPs were obtained to evaluate the vascular anatomy.  CONTRAST:  167mL OMNIPAQUE IOHEXOL 350 MG/ML SOLN  COMPARISON:  None.  FINDINGS: Large right-sided pleural effusion is noted with right lower lobe consolidation. A smaller left sided effusion is noted with left lower lobe consolidation. The thoracic inlet is within normal limits. The thoracic aorta in its branches  are unremarkable. No aneurysm or dissection is seen. Changes consistent with prior aortic valve repair are noted. Coronary calcifications are seen. The pulmonary artery is well visualized and within normal branching pattern. No filling defects to suggest pulmonary emboli are noted. Mild central vascular congestion is seen similar to that noted on the prior chest x-ray. No hilar or mediastinal adenopathy is seen.  Scanning into the upper abdomen reveals stomach to the significantly distended. No acute bony abnormality is noted.  Review of the MIP images confirms the above findings.  IMPRESSION: Changes consistent with CHF with large pleural effusions bilaterally, right greater than left with associated lower lobe consolidation.  No evidence of pulmonary emboli.  Distended stomach of uncertain significance.   Electronically Signed   By: Inez Catalina M.D.   On: 08/04/2014 02:40   Dg Chest Port 1 View  08/03/2014   CLINICAL DATA:  Shortness of breath  EXAM: PORTABLE CHEST - 1 VIEW  COMPARISON:  07/25/2014  FINDINGS: Cardiac shadow remains enlarged. Diffuse vascular congestion is noted with interstitial edema and mild parenchymal edema consistent with CHF. No acute bony abnormality is seen. No focal infiltrate is noted.  IMPRESSION: Moderate CHF.   Electronically Signed   By: Inez Catalina M.D.   On: 08/03/2014 20:16   Dg Chest Port 1 View  07/25/2014   CLINICAL DATA:  Short of breath. History of cardiomyopathy and aortic valve replacement. History of hypertension.  EXAM: PORTABLE CHEST - 1 VIEW  COMPARISON:  07/23/2014.  FINDINGS: Changes from cardiac surgery are stable. Cardiac silhouette is normal in overall size and configuration. No mediastinal or hilar masses.  Stable elevation the right hemidiaphragm.  Lungs are clear.  No pleural effusion or pneumothorax.  IMPRESSION: No acute cardiopulmonary disease.   Electronically Signed   By: Lajean Manes M.D.   On: 07/25/2014 17:46   Dg Chest Port 1  View  07/23/2014   CLINICAL DATA:  Hypoxia.  EXAM: PORTABLE CHEST - 1 VIEW  COMPARISON:  07/21/2014  FINDINGS: Patient has had median sternotomy. Heart is enlarged. There is mild interstitial edema. No focal consolidations or pleural effusions are identified. Elevation of the right hemidiaphragm is stable.  IMPRESSION: Cardiomegaly and mild edema.   Electronically Signed   By: Shon Hale M.D.   On: 07/23/2014 08:04   Dg Chest Port 1 View  07/21/2014   CLINICAL DATA:  68 year old male presenting with acute onset of chest pain.  EXAM: PORTABLE CHEST - 1 VIEW  COMPARISON:  Chest x-ray 10/03/2010.  FINDINGS: Lung volumes are low. Chronic elevation of the right hemidiaphragm is unchanged. No consolidative airspace  disease. No pleural effusions. No pneumothorax. No pulmonary nodule or mass noted. Pulmonary vasculature and the cardiomediastinal silhouette are within normal limits. Status post median sternotomy.  IMPRESSION: 1. Low lung volumes without radiographic evidence of acute cardiopulmonary disease.   Electronically Signed   By: Vinnie Langton M.D.   On: 07/21/2014 10:23    Microbiology: Recent Results (from the past 240 hour(s))  Clostridium Difficile by PCR     Status: None   Collection Time: 07/31/14  3:59 PM  Result Value Ref Range Status   C difficile by pcr NEGATIVE NEGATIVE Final     Labs: Basic Metabolic Panel:  Recent Labs Lab 08/03/14 2025 08/04/14 0110 08/04/14 0824 08/05/14 0339 08/06/14 0522  NA 127* 128* 126* 130* 135*  K 4.0 3.9 5.0 4.1 4.2  CL 92* 91* 91* 93* 95*  CO2 26 28 26 26 29   GLUCOSE 169* 238* 315* 143* 196*  BUN 12 13 16 14 10   CREATININE 0.65 0.68 0.68 0.74 0.63  CALCIUM 8.2* 8.3* 8.1* 8.1* 8.5  MG 2.2  --   --   --   --    Liver Function Tests:  Recent Labs Lab 08/03/14 2025 08/05/14 0339  AST 14 14  ALT 9 8  ALKPHOS 110 96  BILITOT 0.3 0.3  PROT 6.4 6.1  ALBUMIN 2.3* 2.1*   No results for input(s): LIPASE, AMYLASE in the last 168  hours. No results for input(s): AMMONIA in the last 168 hours. CBC:  Recent Labs Lab 08/03/14 2025 08/05/14 0339  WBC 14.2* 12.9*  NEUTROABS 11.4*  --   HGB 11.0* 11.0*  HCT 33.5* 34.1*  MCV 82.7 82.6  PLT 385 404*   Cardiac Enzymes:  Recent Labs Lab 08/03/14 2025 08/04/14 0110 08/04/14 0824  TROPONINI <0.30 <0.30 <0.30   BNP: BNP (last 3 results) No results for input(s): PROBNP in the last 8760 hours. CBG:  Recent Labs Lab 08/06/14 1613 08/06/14 2119 08/07/14 0556 08/07/14 1115 08/07/14 1618  GLUCAP 145* 207* 110* 139* 141*       Signed:  Caylor Tallarico ANP [facility] 08/10/2014, 3:24 PM Examined patient and discussed assessment and plan with ANP Ebony Hail and agree with above. Patient with multiple complex medical problems> 35 minutes spent in direct patient care.  Patient discharged on the weekend and update to the discharge summary was done.   Gilles Chiquito, MD

## 2014-08-04 ENCOUNTER — Inpatient Hospital Stay (HOSPITAL_COMMUNITY): Payer: Medicare HMO

## 2014-08-04 LAB — BLOOD GAS, ARTERIAL
ACID-BASE EXCESS: 3.4 mmol/L — AB (ref 0.0–2.0)
Bicarbonate: 27.4 mEq/L — ABNORMAL HIGH (ref 20.0–24.0)
DRAWN BY: 11249
FIO2: 0.5 %
O2 SAT: 97.7 %
Patient temperature: 97.6
TCO2: 28.6 mmol/L (ref 0–100)
pCO2 arterial: 39.7 mmHg (ref 35.0–45.0)
pH, Arterial: 7.45 (ref 7.350–7.450)
pO2, Arterial: 84.6 mmHg (ref 80.0–100.0)

## 2014-08-04 LAB — BASIC METABOLIC PANEL
Anion gap: 9 (ref 5–15)
Anion gap: 9 (ref 5–15)
BUN: 13 mg/dL (ref 6–23)
BUN: 16 mg/dL (ref 6–23)
CHLORIDE: 91 meq/L — AB (ref 96–112)
CO2: 28 meq/L (ref 19–32)
CO2: 26 meq/L (ref 19–32)
Calcium: 8.3 mg/dL — ABNORMAL LOW (ref 8.4–10.5)
Calcium: 8.1 mg/dL — ABNORMAL LOW (ref 8.4–10.5)
Chloride: 91 mEq/L — ABNORMAL LOW (ref 96–112)
Creatinine, Ser: 0.68 mg/dL (ref 0.50–1.35)
Creatinine, Ser: 0.68 mg/dL (ref 0.50–1.35)
GFR calc Af Amer: >90 mL/min (ref >90)
GFR calc Af Amer: >90 mL/min (ref >90)
GFR calc non Af Amer: >90 mL/min (ref >90)
GFR calc non Af Amer: >90 mL/min (ref >90)
GLUCOSE: 238 mg/dL — AB (ref 70–99)
GLUCOSE: 315 mg/dL — AB (ref 70–99)
POTASSIUM: 3.9 meq/L (ref 3.7–5.3)
Potassium: 5 mEq/L (ref 3.7–5.3)
SODIUM: 126 meq/L — AB (ref 137–147)
Sodium: 128 mEq/L — ABNORMAL LOW (ref 137–147)

## 2014-08-04 LAB — GLUCOSE, CAPILLARY
GLUCOSE-CAPILLARY: 344 mg/dL — AB (ref 70–99)
GLUCOSE-CAPILLARY: 356 mg/dL — AB (ref 70–99)
GLUCOSE-CAPILLARY: 333 mg/dL — AB (ref 70–99)
Glucose-Capillary: 188 mg/dL — ABNORMAL HIGH (ref 70–99)
Glucose-Capillary: 211 mg/dL — ABNORMAL HIGH (ref 70–99)
Glucose-Capillary: 226 mg/dL — ABNORMAL HIGH (ref 70–99)
Glucose-Capillary: 270 mg/dL — ABNORMAL HIGH (ref 70–99)
Glucose-Capillary: 316 mg/dL — ABNORMAL HIGH (ref 70–99)
Glucose-Capillary: 36 mg/dL — CL (ref 70–99)

## 2014-08-04 LAB — TROPONIN I: Troponin I: <0.3 ng/mL (ref <0.30)

## 2014-08-04 MED ORDER — INSULIN ASPART 100 UNIT/ML ~~LOC~~ SOLN: SUBCUTANEOUS | Status: DC

## 2014-08-04 MED ORDER — INSULIN GLARGINE 100 UNIT/ML ~~LOC~~ SOLN: 20.0000 [IU] | Freq: Every day | SUBCUTANEOUS | Status: DC

## 2014-08-04 MED ORDER — INSULIN ASPART 100 UNIT/ML ~~LOC~~ SOLN
4.0000 [IU] | Freq: Once | SUBCUTANEOUS | Status: AC
  Administered 2014-08-04: 4 [IU] via SUBCUTANEOUS

## 2014-08-04 MED ORDER — INSULIN ASPART 100 UNIT/ML ~~LOC~~ SOLN: 0.0000 [IU] | Freq: Every day | SUBCUTANEOUS | Status: DC

## 2014-08-04 MED ORDER — INSULIN ASPART 100 UNIT/ML ~~LOC~~ SOLN
0.0000 [IU] | Freq: Every day | SUBCUTANEOUS | Status: DC
  Administered 2014-08-05: 3 [IU] via SUBCUTANEOUS
  Administered 2014-08-06: 2 [IU] via SUBCUTANEOUS

## 2014-08-04 MED ORDER — INSULIN ASPART 100 UNIT/ML ~~LOC~~ SOLN
0.0000 [IU] | Freq: Three times a day (TID) | SUBCUTANEOUS | Status: DC
  Administered 2014-08-04: 8 [IU] via SUBCUTANEOUS
  Administered 2014-08-05: 11 [IU] via SUBCUTANEOUS
  Administered 2014-08-05 (×2): 5 [IU] via SUBCUTANEOUS
  Administered 2014-08-06: 2 [IU] via SUBCUTANEOUS
  Administered 2014-08-06: 3 [IU] via SUBCUTANEOUS
  Administered 2014-08-06: 8 [IU] via SUBCUTANEOUS
  Administered 2014-08-07 (×2): 2 [IU] via SUBCUTANEOUS

## 2014-08-04 MED ORDER — SODIUM CHLORIDE 0.9 % IJ SOLN
3.0000 mL | INTRAMUSCULAR | Status: DC | PRN
  Administered 2014-08-07: 3 mL via INTRAVENOUS
  Filled 2014-08-04: qty 3

## 2014-08-04 MED ORDER — INSULIN GLARGINE 100 UNIT/ML ~~LOC~~ SOLN: 25.0000 [IU] | Freq: Every day | SUBCUTANEOUS | Status: DC

## 2014-08-04 MED ORDER — INSULIN GLARGINE 100 UNIT/ML ~~LOC~~ SOLN
25.0000 [IU] | Freq: Every day | SUBCUTANEOUS | Status: DC
  Administered 2014-08-04: 25 [IU] via SUBCUTANEOUS
  Filled 2014-08-04: qty 0.25

## 2014-08-04 MED ORDER — INSULIN GLARGINE 100 UNIT/ML ~~LOC~~ SOLN
20.0000 [IU] | Freq: Every day | SUBCUTANEOUS | Status: DC
  Filled 2014-08-04: qty 0.2

## 2014-08-04 MED ORDER — INSULIN ASPART 100 UNIT/ML ~~LOC~~ SOLN
0.0000 [IU] | Freq: Three times a day (TID) | SUBCUTANEOUS | Status: DC
  Administered 2014-08-04: 7 [IU] via SUBCUTANEOUS

## 2014-08-04 MED ORDER — FUROSEMIDE 10 MG/ML IJ SOLN
60.0000 mg | Freq: Two times a day (BID) | INTRAMUSCULAR | Status: DC
  Administered 2014-08-04 – 2014-08-05 (×3): 60 mg via INTRAVENOUS
  Filled 2014-08-04 (×4): qty 6

## 2014-08-04 MED ORDER — IOHEXOL 350 MG/ML SOLN
100.0000 mL | Freq: Once | INTRAVENOUS | Status: AC | PRN
  Administered 2014-08-04: 100 mL via INTRAVENOUS

## 2014-08-04 MED ORDER — INSULIN ASPART 100 UNIT/ML ~~LOC~~ SOLN
3.0000 [IU] | Freq: Three times a day (TID) | SUBCUTANEOUS | Status: DC
  Administered 2014-08-04 (×3): 3 [IU] via SUBCUTANEOUS

## 2014-08-04 NOTE — Progress Notes (Signed)
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    PT Comments    Patient continues to progress well with mobility, ambulating with RW increased distance.  VSS, HR remained in the 90s and SpO2 >94% on room air.  Patient tolerated general LE AROM and assisted OOB to chair. Will trial stair negotiation next session in preparation for discharge.  Follow Up Recommendations  Home health PT;Supervision/Assistance - 24 hour (pending progress)     Equipment Recommendations  Rolling walker with 5" wheels    Recommendations for Other Services       Precautions / Restrictions Precautions Precautions: Fall Restrictions Weight Bearing Restrictions: No    Mobility  Bed Mobility Overal bed mobility: Needs Assistance Bed Mobility: Rolling;Supine to Sit Rolling: Supervision   Supine to sit: Supervision     General bed mobility comments: Increased time to perform, rolling in both directions, assist with pericare and hygiene   Transfers Overall transfer level: Needs assistance Equipment used: Rolling walker (2 wheeled) Transfers: Sit to/from Stand Sit to Stand: Supervision         General transfer comment: Improved ability to come to elevated position this session  Ambulation/Gait Ambulation/Gait assistance: Supervision Ambulation Distance (Feet): 510 Feet Assistive device: Rolling walker (2 wheeled) Gait Pattern/deviations: Step-through pattern;Decreased stride length;Trunk flexed;Drifts  right/left Gait velocity: decreased Gait velocity interpretation: Below normal speed for age/gender General Gait Details: 2 standing rest breaks, cues for posture during ambulation. SpO2 >94% HR  remained stable in the 90s   Stairs            Wheelchair Mobility    Modified Rankin (Stroke Patients Only)       Balance   Sitting-balance support: Feet supported Sitting balance-Leahy Scale: Good     Standing balance support: Bilateral upper extremity supported Standing balance-Leahy Scale: Fair Standing balance comment: min guard to supervision                    Cognition Arousal/Alertness: Awake/alert Behavior During Therapy: WFL for tasks assessed/performed Overall Cognitive Status: Within Functional Limits for tasks assessed                      Exercises      General Comments General comments (skin integrity, edema, etc.): Vitals monitored and spot checked throughout session, VSS on room air.       Pertinent Vitals/Pain Pain Assessment: No/denies pain    Home Living                      Prior Function            PT Goals (current goals can now be found in the care plan section) Acute Rehab PT Goals Patient Stated Goal: to get moving more and go home PT Goal Formulation: With patient Time For Goal Achievement: 08/09/14 Potential to Achieve Goals: Good Progress towards PT goals: Progressing toward goals    Frequency  Min 3X/week    PT Plan Current plan remains appropriate    Co-evaluation  End of Session Equipment Utilized During Treatment: Gait belt Activity Tolerance: Patient tolerated treatment well;Patient limited by fatigue Patient left: in chair;with call bell/phone within reach     Time: 2761-4709 PT Time Calculation (min) (ACUTE ONLY): 24 min  Charges:  $Gait Training: 8-22 mins $Therapeutic Activity: 8-22 mins                    G CodesDuncan Dull 08-20-2014, 10:45 AM [name], PT DPT  [phone]

## 2014-08-04 NOTE — Progress Notes (Signed)
[name]  JEREME LOREN IFO:[id number] DOB: 1946-08-12 DOA: 07/21/2014 PCP: [name], MD  Brief narrative: 68 yo M with PMH of DM, HTN, Afib, h/o bioprosthetic valve replacement in 2008, h/o tenosynovitis and I&D in 8/15 who presented to the ER with generalized malaise, and chest pain, and was found to have Afib with RVR.  Cards admitted him.    He had not been taking his oral hypoglycemics for months - his CBGs had been running in 300s-400s for weeks.  His CBGs have become much better controlled while hospitalized with the use of long-acting insulin. Since plans are to discharge home on insulin Glucotrol was discontinued on 11/9. Pre authorization was completed for Lantus Solostar flexpen and patient was approved; email printed and placed on shadow chart.   Has had leukocytosis since admission. Has been experiencing rectal pain presumed to be hemorrhoidal but developed redness and spontaneous drainage c/w peri rectal abscess. Empiric anbx's started and surgery consulted. Since area spontaneously draining no indications for incision and drainage.  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 Pt has recommended HH PT.  Plans were to discharge the patient on the afternoon of 11/17. Just prior to discharge the patient developed significant respiratory failure consistent with flash pulmonary edema. He was treated with oxygen and IV Lasix with brisk diuresis. Discharge was canceled. After the episode of respiratory failure patient developed significant hypoglycemia with CBG dipping to 36 so all insulins were discontinued. By the morning of 11/18 patient's respiratory status had returned to baseline although he still demonstrated bilateral crackles. He was stable on room air.  HPI/Subjective: Up in chair, denies shortness of breath.  Assessment/Plan: Acute respiratory failure with hypoxia due to progressive  systolic dysfunction EF 67-67% -EF in March 45-50% with associated grade 2 diastolic dysfunction  - echo this admission demonstrated worsened EF now down to 25% likely mediated by persistent tachycardia  -during initial portion of hospitalization was able to be weaned off oxygen and had remained euvolemic off Lasix -prior to discharge 11/17 developed acute flash pulmonary edema prompting cancellation of discharge and initiation of IV Lasix -per cardiology continue aggressive diuresis and will need to discharge home on oral Lasix -cardiology also adjusting medications so that a ARB can be added today -calcium channel blocker discontinued in setting of controlled ventricular rate and known systolic dysfunction  Diabetic ketoacidosis / diabetes mellitus/hypoglycemia -Suspect initial DKA driven by smouldering perirectal abscess  - Hemoglobin A1c > 11 - now on Lantus - will need insulin at discharge  - diabetes educator consulted  - pre authorization for Lantus Flexpen completed 11/6 and email response confirming approval printed and placed in shadow chart  - had severe hypoglycemia with hypothermia on the afternoon of 11/17 with CBG 36 therefore Lantus dosage decreased markedly; meal coverage also decreased-CBGs have recovered for now - needs ACE as soon as BP improved (currently marginally low at times)  Parox Atrial fibrillation with RVR -Per Cardiology  - persistent tachycardia now mostly resolved after increased dose of cardizem -given systolic dysfunction and adequate rate control with carvedilol cardiology has discontinued Cardizem on 11/18  LAA Thrombus -unable to proceed with DCCV at this time  - Eliquis per Cards -copay $45  Dehydration -Resolved - Due to DKA   Peri rectal abscess / Hemorrhoids  -developed foul smelling, yellow/tan drainage spontaneously from left buttock near rectum c/w peri rectal abscess  - also has external hemorrhoids on exam  -  Surgery noted no indication  to pursue I&D at this time  - wound cx positive for Klebsiella sensitive to Zosyn -now on oral abx   -have asked for [facility] RN to help follow to healing; also has limited mobility due to recent finger surgery  Hypertension -BP currently well controlled;actually soft in setting of aggressive diuresis  H/O aortic valve replacement with tissue graft -no SBE on TEE 07/23/2014  Tenosynovitis of finger -Now s/p amputation - doing well with no signif pain in hand   Patient non adherence -Attending MD had discussed at length with patient 11/5  - okay with plans to transition to insulin at discharge  - agrees diabetes education consult would be beneficial  DVT prophylaxis: SCDs Code Status: FULL Disposition: SDU due to recent acute flash pulmonary edema  Procedures: 11/6 TEE - EF 25-30% - calcified thrombus in LAA 11/12 - right long finger P-2 level amputation under local   Consults: Cardiology/Dr Brackbill Hand Surgery/Dr. Burney Gauze  Antibiotics: Unasyn 11/20> 11/11 Flagyl 11/10 > 11/11 Zosyn 11/11 >11/15 Ceftin 11/15>  Objective: Blood pressure 107/62, pulse 119, temperature 97.9 F (36.6 C), temperature source Oral, resp. rate 27, height 6' (1.829 m), weight 259 lb 7.7 oz (117.7 kg), SpO2 99 %.  Intake/Output Summary (Last 24 hours) at 08/04/14 1336 Last data filed at 08/04/14 1310  Gross per 24 hour  Intake    329 ml  Output   3205 ml  Net  -2876 ml   Exam: Gen: No acute respiratory distress - alert and conversant  Chest: bilateral crackles; primarily in bases-has weaned to room air Cardiac: Irregular, slightly tachycardic rate and rhythm, no rubs murmurs or gallops Abdomen: Soft nontender nondistended without obvious hepatosplenomegaly, no ascites Extremities: no signif cyanosis or clubbing - R hand dressed and dry   Scheduled Meds:  Scheduled Meds: . amiodarone  200 mg Oral BID  . antiseptic oral rinse  7 mL Mouth Rinse BID  . apixaban  5 mg Oral BID  . aspirin EC   81 mg Oral Daily  . atorvastatin  10 mg Oral q morning - 10a  . carvedilol  37.5 mg Oral BID WC  . cefUROXime  500 mg Oral BID WC  . docusate sodium  100 mg Oral BID  . furosemide  60 mg Intravenous BID  . insulin aspart  0-5 Units Subcutaneous QHS  . insulin aspart  0-9 Units Subcutaneous TID WC  . insulin aspart  3 Units Subcutaneous TID WC  . insulin glargine  25 Units Subcutaneous QHS  . polyethylene glycol  17 g Oral Daily    Data Reviewed: Basic Metabolic Panel:  Recent Labs Lab 08/02/14 0002 08/03/14 0347 08/03/14 2025 08/04/14 0110 08/04/14 0824  NA 132* 131* 127* 128* 126*  K 4.2 4.1 4.0 3.9 5.0  CL 95* 95* 92* 91* 91*  CO2 26 26 26 28 26   GLUCOSE 232* 83 169* 238* 315*  BUN 12 11 12 13 16   CREATININE 0.76 0.63 0.65 0.68 0.68  CALCIUM 7.8* 8.2* 8.2* 8.3* 8.1*  MG  --   --  2.2  --   --      Liver Function Tests:  Recent Labs Lab 07/29/14 0320 07/31/14 0330 08/03/14 2025  AST 14 12 14   ALT 13 8 9   ALKPHOS 136* 103 110  BILITOT 0.4 0.3 0.3  PROT 5.8* 5.9* 6.4  ALBUMIN 2.1* 2.1* 2.3*   CBC:  Recent Labs Lab 07/31/14 0330 08/01/14 0036 08/02/14 0002 08/03/14 0347 08/03/14 2025  WBC 16.8* 16.3* 15.8* 15.3* 14.2*  NEUTROABS  --   --   --   --  11.4*  HGB 12.2* 11.4* 11.3* 11.5* 11.0*  HCT 37.1* 35.2* 34.6* 35.6* 33.5*  MCV 85.1 85.2 85.4 83.4 82.7  PLT 420* 390 414* 388 385   CBG:  Recent Labs Lab 08/04/14 0018 08/04/14 0252 08/04/14 0533 08/04/14 0804 08/04/14 1157  GLUCAP 226* 344* 356* 316* 333*    Recent Results (from the past 240 hour(s))  Wound culture     Status: None   Collection Time: 07/27/14  9:13 AM  Result Value Ref Range Status   Specimen Description WOUND BUTTOCKS  Final   Special Requests Normal  Final   Gram Stain   Final    FEW WBC PRESENT,BOTH PMN AND MONONUCLEAR NO SQUAMOUS EPITHELIAL CELLS SEEN FEW GRAM NEGATIVE RODS Performed at [facility]    Culture   Final    MODERATE KLEBSIELLA  PNEUMONIAE Performed at [facility]    Report Status 07/29/2014 FINAL  Final   Organism ID, Bacteria KLEBSIELLA PNEUMONIAE  Final      Susceptibility   Klebsiella pneumoniae - MIC*    AMPICILLIN >=32 RESISTANT Resistant     AMPICILLIN/SULBACTAM 4 SENSITIVE Sensitive     CEFAZOLIN <=4 SENSITIVE Sensitive     CEFEPIME <=1 SENSITIVE Sensitive     CEFTAZIDIME <=1 SENSITIVE Sensitive     CEFTRIAXONE <=1 SENSITIVE Sensitive     CIPROFLOXACIN <=0.25 SENSITIVE Sensitive     GENTAMICIN <=1 SENSITIVE Sensitive     IMIPENEM <=0.25 SENSITIVE Sensitive     PIP/TAZO <=4 SENSITIVE Sensitive     TOBRAMYCIN <=1 SENSITIVE Sensitive     TRIMETH/SULFA <=20 SENSITIVE Sensitive     * MODERATE KLEBSIELLA PNEUMONIAE  Urine culture     Status: None   Collection Time: 07/28/14  5:39 PM  Result Value Ref Range Status   Specimen Description URINE, RANDOM  Final   Special Requests NONE  Final   Culture  Setup Time   Final    07/28/2014 18:16 Performed at [place]   Final    >=100,000 COLONIES/ML Performed at [facility]    Culture   Final    Multiple bacterial morphotypes present, none predominant. Suggest appropriate recollection if clinically indicated. Performed at [facility]    Report Status 07/29/2014 FINAL  Final  Wound culture     Status: None   Collection Time: 07/29/14  2:58 PM  Result Value Ref Range Status   Specimen Description WOUND FINGER RIGHT  Final   Special Requests NONE  Final   Gram Stain   Final    MODERATE WBC PRESENT,BOTH PMN AND MONONUCLEAR NO SQUAMOUS EPITHELIAL CELLS SEEN FEW GRAM POSITIVE COCCI IN PAIRS RARE GRAM NEGATIVE RODS Performed at [facility]    Culture   Final    MODERATE STAPHYLOCOCCUS AUREUS Note: RIFAMPIN AND GENTAMICIN SHOULD NOT BE USED AS SINGLE DRUGS FOR TREATMENT OF STAPH INFECTIONS. Performed at [facility]    Report Status 08/01/2014 FINAL  Final   Organism ID, Bacteria  STAPHYLOCOCCUS AUREUS  Final      Susceptibility   Staphylococcus aureus - MIC*    CLINDAMYCIN >=8 RESISTANT Resistant     ERYTHROMYCIN >=8 RESISTANT Resistant     GENTAMICIN <=0.5 SENSITIVE Sensitive     LEVOFLOXACIN <=0.12 SENSITIVE Sensitive     OXACILLIN 1 SENSITIVE Sensitive     PENICILLIN >=0.5 RESISTANT  Resistant     RIFAMPIN <=0.5 SENSITIVE Sensitive     TRIMETH/SULFA <=10 SENSITIVE Sensitive     VANCOMYCIN <=0.5 SENSITIVE Sensitive     TETRACYCLINE >=16 RESISTANT Resistant     MOXIFLOXACIN <=0.25 SENSITIVE Sensitive     * MODERATE STAPHYLOCOCCUS AUREUS  Anaerobic culture     Status: None   Collection Time: 07/29/14  2:58 PM  Result Value Ref Range Status   Specimen Description WOUND FINGER RIGHT  Final   Special Requests NONE  Final   Gram Stain   Final    MODERATE WBC PRESENT,BOTH PMN AND MONONUCLEAR NO SQUAMOUS EPITHELIAL CELLS SEEN FEW GRAM POSITIVE COCCI IN PAIRS RARE GRAM NEGATIVE RODS Performed at [facility]    Culture   Final    NO ANAEROBES ISOLATED Performed at [facility]    Report Status 08/03/2014 FINAL  Final  Clostridium Difficile by PCR     Status: None   Collection Time: 07/31/14  3:59 PM  Result Value Ref Range Status   C difficile by pcr NEGATIVE NEGATIVE Final     Studies:  Recent x-ray studies have been reviewed in detail by the Attending Physician  Time spent :  25 mins  Erin Hearing, ANP [facility] For Consults/Admissions - Flow Manager - [phone] Office  [phone] Pager [phone]  On-Call/Text Page:      [email or web address]      password [facility]  08/04/2014, 1:36 PM   LOS: 14 days   I have personally examined this patient and reviewed the entire database. I have reviewed the above note, made any necessary editorial changes, and agree with its content.  Cherene Altes, MD [facility]

## 2014-08-04 NOTE — Progress Notes (Signed)
Upon initial assessment. Pt was diaphoretic, lethargic, and slow to mentate. CBG was obtained with a reading of 36. Two amps of D50 was given. A rectal temp was also performed with a temp of 91.1. Bear hugger applied. CBG stabilized. Pt became more alert, was able to eat. Will continue to monitor.

## 2014-08-04 NOTE — Progress Notes (Signed)
Visited patient at bedside to offer [facility] services as benefit of his [employer]. Patient agreeable to services and will receive post hospital discharge call and will be evaluated for monthly home visits.  Of note, [facility] Care Management services does not replace or interfere with any services that are arranged by inpatient case management or social work. For additional questions or referrals please contact, Natividad Brood, RN BSN CCM, [place] [facility] Liaison at [phone].  Also met with RNCM, [place] and notified of [facility] services offered.

## 2014-08-04 NOTE — Progress Notes (Signed)
Inpatient Diabetes Program Recommendations  AACE/ADA: New Consensus Statement on Inpatient Glycemic Control (2013)  Target Ranges:  Prepandial:   less than 140 mg/dL      Peak postprandial:   less than 180 mg/dL (1-2 hours)      Critically ill patients:  140 - 180 mg/dL   Consult received regarding hypoglycemia last night and holding the lantus ordered at HS The lantus should not be held due to hypoglycemia-the low cbg shoukl be treated and the basal is still given as ordered. Will talk with nursing about this incident.  Requesting a decrease in lantus to 40 units per below. (May even suggest decrease in meal coverage to 2 units, although I think the 3 units will be appropriate.) Inpatient Diabetes Program Recommendations Insulin - Basal: Would recommend basal lantus dose of 40 units daily or HS (from 44 units) Correction (SSI): xxxxxxx Insulin - Meal Coverage: xxxxxxxxxxxxxxxxx   Thank you, Rosita Kea, RN, CNS, Diabetes Coordinator [phone])

## 2014-08-04 NOTE — Progress Notes (Signed)
CRITICAL VALUE ALERT  Critical value received:  CBG 36  Date of notification:  08/03/2014  Time of notification:  2008  Critical value read back:Yes.    Nurse who received alert:  Annice Pih   MD notified (1st page):  Dr. Sherral Hammers present on the unit.   Time of first page:  2008  MD notified (2nd page):  Time of second page:  Responding MD:  Dr. Sherral Hammers  Time MD responded:  2008

## 2014-08-04 NOTE — Progress Notes (Signed)
CRITICAL VALUE ALERT  Critical value received:  CBG 63  Date of notification:  08/03/2014  Time of notification:  2147  Critical value read back:Yes.    Nurse who received alert:  Annice Pih  MD notified (1st page):  Dr. Sherral Hammers is on the unit  Time of first page:  2147  MD notified (2nd page):  Time of second page:  Responding MD:  Dr. Sherral Hammers  Time MD responded:  2147

## 2014-08-04 NOTE — Progress Notes (Signed)
Patient Name: Thomas Wright Date of Encounter: 08/04/2014     Principal Problem:   Diabetic ketoacidosis Active Problems:   Cardiomyopathy   H/O aortic valve replacement with tissue graft   Atrial fibrillation with RVR   Hypertension   Tenosynovitis of finger   Dehydration   Patient non adherence   Leukocytosis   LA thrombus   Acute on chronic respiratory failure with hypoxia   Systolic CHF, acute on chronic   Left atrial thrombus   Perirectal abscess   Other hemorrhoids   Noncompliance with medication regimen   Type 2 diabetes mellitus with ketoacidosis without coma   Chest pain   Flash pulmonary edema   Hypoglycemia   Nonspecific abnormal electrocardiogram (ECG) (EKG)    SUBJECTIVE  Patient seen while in transport. Denies any current CP or SOB. Does remember a little of what happened last night. Felt anxious and severely SOB at the time of event.  CURRENT MEDS . amiodarone  200 mg Oral BID  . antiseptic oral rinse  7 mL Mouth Rinse BID  . apixaban  5 mg Oral BID  . aspirin EC  81 mg Oral Daily  . atorvastatin  10 mg Oral q morning - 10a  . carvedilol  37.5 mg Oral BID WC  . cefUROXime  500 mg Oral BID WC  . docusate sodium  100 mg Oral BID  . furosemide  60 mg Intravenous BID  . insulin aspart  0-5 Units Subcutaneous QHS  . insulin aspart  0-9 Units Subcutaneous TID WC  . insulin aspart  3 Units Subcutaneous TID WC  . insulin glargine  25 Units Subcutaneous QHS  . polyethylene glycol  17 g Oral Daily    OBJECTIVE  Filed Vitals:   08/04/14 0014 08/04/14 0036 08/04/14 0400 08/04/14 0758  BP: 141/108 96/58 107/62   Pulse: 25 67 119   Temp:  92.3 F (33.5 C) 97.6 F (36.4 C) 97.7 F (36.5 C)  TempSrc:  Rectal Oral Oral  Resp: 21 22 27    Height:      Weight:   259 lb 7.7 oz (117.7 kg)   SpO2: 95% 94% 99%     Intake/Output Summary (Last 24 hours) at 08/04/14 1105 Last data filed at 08/04/14 0805  Gross per 24 hour  Intake    179 ml  Output   4303  ml  Net  -4124 ml   Filed Weights   08/02/14 0410 08/03/14 0523 08/04/14 0400  Weight: 247 lb 9.2 oz (112.3 kg) 251 lb 8.7 oz (114.1 kg) 259 lb 7.7 oz (117.7 kg)    PHYSICAL EXAM  General: Pleasant, NAD. Neuro: Alert and oriented X 3. Moves all extremities spontaneously. Psych: Normal affect. HEENT:  Normal  Neck: Supple without bruits or JVD. Lungs:  Resp regular and unlabored, markedly decreased breath sound in bilateral bases Heart: regularly irregular no s3, s4, or murmurs. Abdomen: Soft, non-tender, non-distended, BS + x 4.  Extremities: No clubbing, cyanosis or edema. DP/PT/Radials 2+ and equal bilaterally.  Accessory Clinical Findings  CBC  Recent Labs  08/03/14 0347 08/03/14 2025  WBC 15.3* 14.2*  NEUTROABS  --  11.4*  HGB 11.5* 11.0*  HCT 35.6* 33.5*  MCV 83.4 82.7  PLT 388 546   Basic Metabolic Panel  Recent Labs  08/03/14 2025 08/04/14 0110 08/04/14 0824  NA 127* 128* 126*  K 4.0 3.9 5.0  CL 92* 91* 91*  CO2 26 28 26   GLUCOSE 169* 238* 315*  BUN  12 13 16   CREATININE 0.65 0.68 0.68  CALCIUM 8.2* 8.3* 8.1*  MG 2.2  --   --    Liver Function Tests  Recent Labs  08/03/14 2025  AST 14  ALT 9  ALKPHOS 110  BILITOT 0.3  PROT 6.4  ALBUMIN 2.3*   No results for input(s): LIPASE, AMYLASE in the last 72 hours. Cardiac Enzymes  Recent Labs  08/03/14 2025 08/04/14 0110 08/04/14 0824  TROPONINI <0.30 <0.30 <0.30    TELE ?sinus vs 2:1 a-flutter. Appear to be more likely 2:1 a-flutter with HR range from 50-90s.     ECG  Aflutter with HR 50s, LBBB  Echocardiogram 07/23/2014  LV EF: 25% -  30%  ------------------------------------------------------------------- Indications:   Atrial flutter 427.32.  ------------------------------------------------------------------- History:  PMH: Dehydration. Aortic Valve Replacement. Cardioversion. Atrial fibrillation. Risk factors:  Hypertension.  ------------------------------------------------------------------- Study Conclusions  - Left ventricle: Systolic function was severely reduced. The estimated ejection fraction was in the range of 25% to 30%. - Aorta: Mild mural aortic debris. - Mitral valve: There was mild regurgitation. - Left atrium: The left atrial appendage was very large. there appeared to be very prominent pectinate muscles. However there also appeared to be significant calcified thrombus. The RAA was clean. Reviewed with Dr Annye Asa who also agreed that [facility] would not be prudent. The atrium was moderately dilated. - Right atrium: No evidence of thrombus in the atrial cavity or appendage. - Atrial septum: No defect or patent foramen ovale was identified. - Tricuspid valve: There was moderate regurgitation.     Radiology/Studies  Ct Angio Chest Pe W/cm &/or Wo Cm  08/04/2014   CLINICAL DATA:  Chest pain and shortness of breath  EXAM: CT ANGIOGRAPHY CHEST WITH CONTRAST  TECHNIQUE: Multidetector CT imaging of the chest was performed using the standard protocol during bolus administration of intravenous contrast. Multiplanar CT image reconstructions and MIPs were obtained to evaluate the vascular anatomy.  CONTRAST:  173mL OMNIPAQUE IOHEXOL 350 MG/ML SOLN  COMPARISON:  None.  FINDINGS: Large right-sided pleural effusion is noted with right lower lobe consolidation. A smaller left sided effusion is noted with left lower lobe consolidation. The thoracic inlet is within normal limits. The thoracic aorta in its branches are unremarkable. No aneurysm or dissection is seen. Changes consistent with prior aortic valve repair are noted. Coronary calcifications are seen. The pulmonary artery is well visualized and within normal branching pattern. No filling defects to suggest pulmonary emboli are noted. Mild central vascular congestion is seen similar to that noted on the prior chest x-ray. No  hilar or mediastinal adenopathy is seen.  Scanning into the upper abdomen reveals stomach to the significantly distended. No acute bony abnormality is noted.  Review of the MIP images confirms the above findings.  IMPRESSION: Changes consistent with CHF with large pleural effusions bilaterally, right greater than left with associated lower lobe consolidation.  No evidence of pulmonary emboli.  Distended stomach of uncertain significance.   Electronically Signed   By: Inez Catalina M.D.   On: 08/04/2014 02:40   Dg Chest Port 1 View  08/03/2014   CLINICAL DATA:  Shortness of breath  EXAM: PORTABLE CHEST - 1 VIEW  COMPARISON:  07/25/2014  FINDINGS: Cardiac shadow remains enlarged. Diffuse vascular congestion is noted with interstitial edema and mild parenchymal edema consistent with CHF. No acute bony abnormality is seen. No focal infiltrate is noted.  IMPRESSION: Moderate CHF.   Electronically Signed   By: Linus Mako.D.  On: 08/03/2014 20:16   Dg Chest Port 1 View  07/25/2014   CLINICAL DATA:  Short of breath. History of cardiomyopathy and aortic valve replacement. History of hypertension.  EXAM: PORTABLE CHEST - 1 VIEW  COMPARISON:  07/23/2014.  FINDINGS: Changes from cardiac surgery are stable. Cardiac silhouette is normal in overall size and configuration. No mediastinal or hilar masses.  Stable elevation the right hemidiaphragm.  Lungs are clear.  No pleural effusion or pneumothorax.  IMPRESSION: No acute cardiopulmonary disease.   Electronically Signed   By: Lajean Manes M.D.   On: 07/25/2014 17:46   Dg Chest Port 1 View  07/23/2014   CLINICAL DATA:  Hypoxia.  EXAM: PORTABLE CHEST - 1 VIEW  COMPARISON:  07/21/2014  FINDINGS: Patient has had median sternotomy. Heart is enlarged. There is mild interstitial edema. No focal consolidations or pleural effusions are identified. Elevation of the right hemidiaphragm is stable.  IMPRESSION: Cardiomegaly and mild edema.   Electronically Signed   By: Shon Hale M.D.   On: 07/23/2014 08:04   Dg Chest Port 1 View  07/21/2014   CLINICAL DATA:  68 year old male presenting with acute onset of chest pain.  EXAM: PORTABLE CHEST - 1 VIEW  COMPARISON:  Chest x-ray 10/03/2010.  FINDINGS: Lung volumes are low. Chronic elevation of the right hemidiaphragm is unchanged. No consolidative airspace disease. No pleural effusions. No pneumothorax. No pulmonary nodule or mass noted. Pulmonary vasculature and the cardiomediastinal silhouette are within normal limits. Status post median sternotomy.  IMPRESSION: 1. Low lung volumes without radiographic evidence of acute cardiopulmonary disease.   Electronically Signed   By: Vinnie Langton M.D.   On: 07/21/2014 10:23    ASSESSMENT AND PLAN  1. A-fib. TEE with LAA thrombus. Has converted to atrial flutter with 4:1 AV conduction. Continue Eliquis.  - continue coreg, BP fluctuate between 90-140s. Telemetry appears to more regular now, possibly 2:1 a-flutter with occasional 4:1 a-flutter. Will get EKG to double check.  - no cardioversion with LAA thrombus. Once his BP stabilize, will consider add ACEI. Continue coreg  2. H/O aortic valve replacement with tissue graft normal gradient and no SBE on TEE on 07/23/2014  3. Acute on chronic systolic HF with worsening EF. EF25-30%. Will continue carvedilol, stop diltiazem.Add ARB.    - on IV lasix. CXR and CTA of chest consistent with CHF and pleural effusion.  - received 80mg  lasix last night and 60mg  lasix this morning. Only on 20mg  daily lasix at home. Given decreased EF, likely will increase to 40mg  daily PO lasix on discharge. Continue IV lasix for now. Follow renal function, if Cr increase, will transition to PO lasix.   4. Diabetic ketoacidosis - resolved, managed by hospitalist service, started on Lantus, will need to be discharged on it   5. Tenosynovitis of finger Post amputation [name] to see Monday on antibiotics  6. Skin care consult per nurse for  rectal drainage Patient has rectal abscess Draining Dr Redmond Pulling has seen 7. AVR no evidence of SBE on TEE Normal gradients   8. Hypothermia: episode of hypothermia noted last night, T 91.1, lethargic, baer hugger applied. Temp improved. Hypoglycemic. Per IM. CTA of chest neg PE, shows CHF and pleural effusion 9. Hyponatremia  Signed, Almyra Deforest PA-C Pager: [phone] Review of telemetry reveals small flutter waves with 4:1 response now.  No chest pain. Denies dyspnea. Chest xray shows decreased aeration at both bases. I have encouraged him to take deep breaths. Agree with above assessment.

## 2014-08-05 DIAGNOSIS — I5023 Acute on chronic systolic (congestive) heart failure: Secondary | ICD-10-CM | POA: Insufficient documentation

## 2014-08-05 DIAGNOSIS — Z9114 Patient's other noncompliance with medication regimen: Secondary | ICD-10-CM

## 2014-08-05 DIAGNOSIS — Z9111 Patient's noncompliance with dietary regimen: Secondary | ICD-10-CM | POA: Insufficient documentation

## 2014-08-05 DIAGNOSIS — I480 Paroxysmal atrial fibrillation: Secondary | ICD-10-CM | POA: Insufficient documentation

## 2014-08-05 DIAGNOSIS — J9601 Acute respiratory failure with hypoxia: Secondary | ICD-10-CM

## 2014-08-05 DIAGNOSIS — Z91119 Patient's noncompliance with dietary regimen due to unspecified reason: Secondary | ICD-10-CM | POA: Insufficient documentation

## 2014-08-05 LAB — COMPREHENSIVE METABOLIC PANEL
ALK PHOS: 96 U/L (ref 39–117)
ALT: 8 U/L (ref 0–53)
ANION GAP: 11 (ref 5–15)
AST: 14 U/L (ref 0–37)
Albumin: 2.1 g/dL — ABNORMAL LOW (ref 3.5–5.2)
BILIRUBIN TOTAL: 0.3 mg/dL (ref 0.3–1.2)
BUN: 14 mg/dL (ref 6–23)
CHLORIDE: 93 meq/L — AB (ref 96–112)
CO2: 26 meq/L (ref 19–32)
Calcium: 8.1 mg/dL — ABNORMAL LOW (ref 8.4–10.5)
Creatinine, Ser: 0.74 mg/dL (ref 0.50–1.35)
GFR calc Af Amer: >90 mL/min (ref >90)
GLUCOSE: 143 mg/dL — AB (ref 70–99)
POTASSIUM: 4.1 meq/L (ref 3.7–5.3)
Sodium: 130 mEq/L — ABNORMAL LOW (ref 137–147)
Total Protein: 6.1 g/dL (ref 6.0–8.3)

## 2014-08-05 LAB — CBC
HEMATOCRIT: 34.1 % — AB (ref 39.0–52.0)
HEMOGLOBIN: 11 g/dL — AB (ref 13.0–17.0)
MCH: 26.6 pg (ref 26.0–34.0)
MCHC: 32.3 g/dL (ref 30.0–36.0)
MCV: 82.6 fL (ref 78.0–100.0)
Platelets: 404 10*3/uL — ABNORMAL HIGH (ref 150–400)
RBC: 4.13 MIL/uL — AB (ref 4.22–5.81)
RDW: 13.4 % (ref 11.5–15.5)
WBC: 12.9 10*3/uL — AB (ref 4.0–10.5)

## 2014-08-05 LAB — GLUCOSE, CAPILLARY
GLUCOSE-CAPILLARY: 252 mg/dL — AB (ref 70–99)
Glucose-Capillary: 239 mg/dL — ABNORMAL HIGH (ref 70–99)
Glucose-Capillary: 303 mg/dL — ABNORMAL HIGH (ref 70–99)

## 2014-08-05 MED ORDER — INSULIN GLARGINE 100 UNIT/ML ~~LOC~~ SOLN
25.0000 [IU] | Freq: Every day | SUBCUTANEOUS | Status: DC
  Administered 2014-08-05 – 2014-08-06 (×2): 25 [IU] via SUBCUTANEOUS
  Filled 2014-08-05 (×3): qty 0.25

## 2014-08-05 MED ORDER — INSULIN GLARGINE 100 UNIT/ML ~~LOC~~ SOLN: 30.0000 [IU] | Freq: Every day | SUBCUTANEOUS | Status: DC

## 2014-08-05 MED ORDER — FUROSEMIDE 20 MG PO TABS: ORAL_TABLET | ORAL | Status: DC

## 2014-08-05 MED ORDER — INSULIN ASPART 100 UNIT/ML ~~LOC~~ SOLN: SUBCUTANEOUS | Status: DC

## 2014-08-05 MED ORDER — LOSARTAN POTASSIUM 25 MG PO TABS
25.0000 mg | ORAL_TABLET | Freq: Every day | ORAL | Status: DC
  Administered 2014-08-05 – 2014-08-07 (×3): 25 mg via ORAL
  Filled 2014-08-05 (×3): qty 1

## 2014-08-05 MED ORDER — INSULIN GLARGINE 100 UNIT/ML ~~LOC~~ SOLN: 25.0000 [IU] | Freq: Every day | SUBCUTANEOUS | Status: DC

## 2014-08-05 MED ORDER — INSULIN ASPART 100 UNIT/ML ~~LOC~~ SOLN
4.0000 [IU] | Freq: Three times a day (TID) | SUBCUTANEOUS | Status: DC
  Administered 2014-08-05 – 2014-08-06 (×4): 4 [IU] via SUBCUTANEOUS

## 2014-08-05 MED ORDER — FUROSEMIDE 40 MG PO TABS
40.0000 mg | ORAL_TABLET | Freq: Two times a day (BID) | ORAL | Status: DC
  Administered 2014-08-05 – 2014-08-06 (×2): 40 mg via ORAL
  Filled 2014-08-05 (×4): qty 1

## 2014-08-05 MED ORDER — FUROSEMIDE 40 MG PO TABS
40.0000 mg | ORAL_TABLET | Freq: Every day | ORAL | Status: DC
  Filled 2014-08-05: qty 1

## 2014-08-05 MED ORDER — INSULIN ASPART 100 UNIT/ML ~~LOC~~ SOLN: 6.0000 [IU] | Freq: Three times a day (TID) | SUBCUTANEOUS | Status: DC

## 2014-08-05 NOTE — Progress Notes (Signed)
Patient Name: Thomas Wright Date of Encounter: 08/05/2014     Principal Problem:   Diabetic ketoacidosis Active Problems:   Cardiomyopathy   H/O aortic valve replacement with tissue graft   Atrial fibrillation with RVR   Hypertension   Tenosynovitis of finger   Dehydration   Patient non adherence   Leukocytosis   LA thrombus   Acute on chronic respiratory failure with hypoxia   Systolic CHF, acute on chronic   Left atrial thrombus   Perirectal abscess   Other hemorrhoids   Noncompliance with medication regimen   Type 2 diabetes mellitus with ketoacidosis without coma   Chest pain   Flash pulmonary edema   Hypoglycemia   Nonspecific abnormal electrocardiogram (ECG) (EKG)    SUBJECTIVE  Patient feels better this am. Less dyspneic. Chest xray yesterday showed improved CHF. Excellent urine output past 24 hours.  CURRENT MEDS . amiodarone  200 mg Oral BID  . antiseptic oral rinse  7 mL Mouth Rinse BID  . apixaban  5 mg Oral BID  . aspirin EC  81 mg Oral Daily  . atorvastatin  10 mg Oral q morning - 10a  . carvedilol  37.5 mg Oral BID WC  . cefUROXime  500 mg Oral BID WC  . docusate sodium  100 mg Oral BID  . furosemide  40 mg Oral Daily  . insulin aspart  0-15 Units Subcutaneous TID WC  . insulin aspart  0-5 Units Subcutaneous QHS  . insulin aspart  4 Units Subcutaneous TID WC  . insulin glargine  25 Units Subcutaneous QHS  . losartan  25 mg Oral Daily  . polyethylene glycol  17 g Oral Daily    OBJECTIVE  Filed Vitals:   08/05/14 0314 08/05/14 0400 08/05/14 0916 08/05/14 0935  BP:  149/74 137/72 137/72  Pulse:  86 109 102  Temp:  98 F (36.7 C)  98.2 F (36.8 C)  TempSrc:  Oral  Oral  Resp:  18  21  Height:      Weight: 231 lb 4.2 oz (104.9 kg)     SpO2:  99%  93%    Intake/Output Summary (Last 24 hours) at 08/05/14 1011 Last data filed at 08/05/14 0941  Gross per 24 hour  Intake    560 ml  Output   3878 ml  Net  -3318 ml   Filed Weights   08/03/14 0523 08/04/14 0400 08/05/14 0314  Weight: 251 lb 8.7 oz (114.1 kg) 259 lb 7.7 oz (117.7 kg) 231 lb 4.2 oz (104.9 kg)    PHYSICAL EXAM  General: Pleasant, NAD. Neuro: Alert and oriented X 3. Moves all extremities spontaneously. Psych: Normal affect. HEENT:  Normal  Neck: Supple without bruits or JVD. Lungs:  Resp regular and unlabored, CTA. Heart: Irregular. no s3, s4, or murmurs. Abdomen: Soft, non-tender, non-distended, BS + x 4.  Extremities: No clubbing, cyanosis or edema. DP/PT/Radials 2+ and equal bilaterally.  Accessory Clinical Findings  CBC  Recent Labs  08/03/14 2025 08/05/14 0339  WBC 14.2* 12.9*  NEUTROABS 11.4*  --   HGB 11.0* 11.0*  HCT 33.5* 34.1*  MCV 82.7 82.6  PLT 385 527*   Basic Metabolic Panel  Recent Labs  08/03/14 2025  08/04/14 0824 08/05/14 0339  NA 127*  < > 126* 130*  K 4.0  < > 5.0 4.1  CL 92*  < > 91* 93*  CO2 26  < > 26 26  GLUCOSE 169*  < > 315* 143*  BUN 12  < > 16 14  CREATININE 0.65  < > 0.68 0.74  CALCIUM 8.2*  < > 8.1* 8.1*  MG 2.2  --   --   --   < > = values in this interval not displayed. Liver Function Tests  Recent Labs  08/03/14 2025 08/05/14 0339  AST 14 14  ALT 9 8  ALKPHOS 110 96  BILITOT 0.3 0.3  PROT 6.4 6.1  ALBUMIN 2.3* 2.1*   No results for input(s): LIPASE, AMYLASE in the last 72 hours. Cardiac Enzymes  Recent Labs  08/03/14 2025 08/04/14 0110 08/04/14 0824  TROPONINI <0.30 <0.30 <0.30   BNP Invalid input(s): POCBNP D-Dimer No results for input(s): DDIMER in the last 72 hours. Hemoglobin A1C No results for input(s): HGBA1C in the last 72 hours. Fasting Lipid Panel No results for input(s): CHOL, HDL, LDLCALC, TRIG, CHOLHDL, LDLDIRECT in the last 72 hours. Thyroid Function Tests No results for input(s): TSH, T4TOTAL, T3FREE, THYROIDAB in the last 72 hours.  Invalid input(s): FREET3  TELE  Atrial flutter with predominantly 2:1 block  ECG    Radiology/Studies  Dg Chest  2 View  08/04/2014   CLINICAL DATA:  Atrial fibrillation, pulmonary edema  EXAM: CHEST  2 VIEW  COMPARISON:  08/03/2014 and 08/04/2014  FINDINGS: Cardiomediastinal silhouette is stable. Central mild vascular congestion without pulmonary edema. There is improvement in aeration. Small right pleural effusion with right basilar atelectasis or infiltrate. Trace left pleural effusion.  IMPRESSION: Central mild vascular congestion without pulmonary edema. Improvement in aeration. Small right pleural effusion with right basilar atelectasis or infiltrate. Trace left pleural effusion PICC   Electronically Signed   By: Lahoma Crocker M.D.   On: 08/04/2014 12:04   Ct Angio Chest Pe W/cm &/or Wo Cm  08/04/2014   CLINICAL DATA:  Chest pain and shortness of breath  EXAM: CT ANGIOGRAPHY CHEST WITH CONTRAST  TECHNIQUE: Multidetector CT imaging of the chest was performed using the standard protocol during bolus administration of intravenous contrast. Multiplanar CT image reconstructions and MIPs were obtained to evaluate the vascular anatomy.  CONTRAST:  120mL OMNIPAQUE IOHEXOL 350 MG/ML SOLN  COMPARISON:  None.  FINDINGS: Large right-sided pleural effusion is noted with right lower lobe consolidation. A smaller left sided effusion is noted with left lower lobe consolidation. The thoracic inlet is within normal limits. The thoracic aorta in its branches are unremarkable. No aneurysm or dissection is seen. Changes consistent with prior aortic valve repair are noted. Coronary calcifications are seen. The pulmonary artery is well visualized and within normal branching pattern. No filling defects to suggest pulmonary emboli are noted. Mild central vascular congestion is seen similar to that noted on the prior chest x-ray. No hilar or mediastinal adenopathy is seen.  Scanning into the upper abdomen reveals stomach to the significantly distended. No acute bony abnormality is noted.  Review of the MIP images confirms the above findings.   IMPRESSION: Changes consistent with CHF with large pleural effusions bilaterally, right greater than left with associated lower lobe consolidation.  No evidence of pulmonary emboli.  Distended stomach of uncertain significance.   Electronically Signed   By: Inez Catalina M.D.   On: 08/04/2014 02:40   Dg Chest Port 1 View  08/03/2014   CLINICAL DATA:  Shortness of breath  EXAM: PORTABLE CHEST - 1 VIEW  COMPARISON:  07/25/2014  FINDINGS: Cardiac shadow remains enlarged. Diffuse vascular congestion is noted with interstitial edema and mild parenchymal edema consistent with CHF. No  acute bony abnormality is seen. No focal infiltrate is noted.  IMPRESSION: Moderate CHF.   Electronically Signed   By: Inez Catalina M.D.   On: 08/03/2014 20:16   Dg Chest Port 1 View  07/25/2014   CLINICAL DATA:  Short of breath. History of cardiomyopathy and aortic valve replacement. History of hypertension.  EXAM: PORTABLE CHEST - 1 VIEW  COMPARISON:  07/23/2014.  FINDINGS: Changes from cardiac surgery are stable. Cardiac silhouette is normal in overall size and configuration. No mediastinal or hilar masses.  Stable elevation the right hemidiaphragm.  Lungs are clear.  No pleural effusion or pneumothorax.  IMPRESSION: No acute cardiopulmonary disease.   Electronically Signed   By: Lajean Manes M.D.   On: 07/25/2014 17:46   Dg Chest Port 1 View  07/23/2014   CLINICAL DATA:  Hypoxia.  EXAM: PORTABLE CHEST - 1 VIEW  COMPARISON:  07/21/2014  FINDINGS: Patient has had median sternotomy. Heart is enlarged. There is mild interstitial edema. No focal consolidations or pleural effusions are identified. Elevation of the right hemidiaphragm is stable.  IMPRESSION: Cardiomegaly and mild edema.   Electronically Signed   By: Shon Hale M.D.   On: 07/23/2014 08:04   Dg Chest Port 1 View  07/21/2014   CLINICAL DATA:  68 year old male presenting with acute onset of chest pain.  EXAM: PORTABLE CHEST - 1 VIEW  COMPARISON:  Chest x-ray  10/03/2010.  FINDINGS: Lung volumes are low. Chronic elevation of the right hemidiaphragm is unchanged. No consolidative airspace disease. No pleural effusions. No pneumothorax. No pulmonary nodule or mass noted. Pulmonary vasculature and the cardiomediastinal silhouette are within normal limits. Status post median sternotomy.  IMPRESSION: 1. Low lung volumes without radiographic evidence of acute cardiopulmonary disease.   Electronically Signed   By: Vinnie Langton M.D.   On: 07/21/2014 10:23    ASSESSMENT AND PLAN 1. A-fib. TEE with LAA thrombus. Has converted to atrial flutter with 4:1 AV conduction. Continue Eliquis. - continue coreg. no cardioversion with LAA thrombus until he has been anticoagulated well for 4 weeks.  2. H/O aortic valve replacement with tissue graft normal gradient and no SBE on TEE on 07/23/2014  3. Acute on chronic systolic HF with recent worsening EF. EF25-30%. Will continue carvedilol, stop diltiazem.Add ARB.  -Switch to oral lasix 40 mg BID today. Renal function remains normal.  4. Diabetic ketoacidosis - resolved, managed by hospitalist service, started on Lantus, will need to be discharged on it   5. Tenosynovitis of finger Post amputation [name] to see Monday on antibiotics  6. Skin care consult per nurse for rectal drainage Patient has rectal abscess Draining Dr Redmond Pulling has seen 7. AVR no evidence of SBE on TEE Normal gradients   8. Hypothermia: No recurrence. 9. Hyponatremia, resolved.   Signed, Darlin Coco MD

## 2014-08-05 NOTE — Progress Notes (Signed)
[name]  Thomas Wright UEA:[id number] DOB: 1945-10-20 DOA: 07/21/2014 PCP: [name], MD  Brief narrative: 68 yo M with PMH of DM, HTN, Afib, h/o bioprosthetic valve replacement in 2008, h/o tenosynovitis and I&D in 8/15 who presented to the ER with generalized malaise, and chest pain, and was found to have Afib with RVR.  Cards admitted him.    He had not been taking his oral hypoglycemics for months - his CBGs had been running in 300s-400s for weeks.  His CBGs have become much better controlled while hospitalized with the use of long-acting insulin. Since plans are to discharge home on insulin Glucotrol was discontinued on 11/9. Pre authorization was completed for Lantus Solostar flexpen and patient was approved; email printed and placed on shadow chart.   Has had leukocytosis since admission. Has been experiencing rectal pain presumed to be hemorrhoidal but developed redness and spontaneous drainage c/w peri rectal abscess. Empiric anbx's started and surgery consulted. Since area spontaneously draining no indications for incision and drainage.  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 Pt has recommended HH PT.  Plans were to discharge the patient on the afternoon of 11/17. Just prior to discharge the patient developed significant respiratory failure consistent with flash pulmonary edema. He was treated with oxygen and IV Lasix with brisk diuresis. Discharge was canceled. After the episode of respiratory failure patient developed significant hypoglycemia with CBG dipping to 36 so all insulins were discontinued. By the morning of 11/18 patient's respiratory status had returned to baseline although he still demonstrated bilateral crackles. He was stable on room air.  HPI/Subjective: No shortness of breath at rest. Endorsing concerns over ability to enter his home because of stairs. Patient to discuss this  with physical therapy so stairs can be incorporated into his daily session.  Assessment/Plan: Acute respiratory failure with hypoxia due to progressive systolic dysfunction EF 47-82% -EF in March 45-50% with associated grade 2 diastolic dysfunction  - echo this admission demonstrated worsened EF now down to 25% likely mediated by persistent tachycardia  -during initial portion of hospitalization was able to be weaned off oxygen and had remained euvolemic off Lasix -prior to discharge 11/17 developed acute flash pulmonary edema prompting cancellation of discharge and initiation of IV Lasix -Cards has changed to PO Lasix BID as of 11/19; will need to discharge home on oral Lasix (was on 20 mg daily prior to admit) -ARB added back 11/18 at 1/2 preadmission dose -calcium channel blocker discontinued in setting of controlled ventricular rate and known systolic dysfunction  Diabetic ketoacidosis / diabetes mellitus/hypoglycemia -Suspect initial DKA driven by smouldering perirectal abscess  - Hemoglobin A1c > 11 - now on Lantus - will need insulin at discharge  - diabetes educator assisted this admission - pre authorization for Lantus Flexpen completed 11/6 and email response confirming approval printed and placed in shadow chart  - had severe hypoglycemia with hypothermia on the afternoon of 11/17 with CBG 36 therefore Lantus dosage decreased markedly; meal coverage also decreased-CBGs have recovered for now -11/19 past 24 hours CBGs were up but this was BEFORE got a dose of Lantus- after Lantus better -11/19 will increase meal coverage to 4 units - needs ACE as soon as BP improved (currently marginally low at times)  Parox Atrial fibrillation with RVR -Per Cardiology  - persistent tachycardia now mostly resolved after increased dose of cardizem -given systolic dysfunction and adequate rate control with carvedilol  cardiology has discontinued Cardizem on 11/18 -now with atrial flutter with  variable conduction and episodic tachycardia (11/19)- mo changes in meds per Cards  LAA Thrombus -unable to proceed with DCCV at this time -anticipate will do in next 4 weeks - Eliquis per Cards -copay $45  Dehydration -Resolved - Due to DKA   Peri rectal abscess / Hemorrhoids  -developed foul smelling, yellow/tan drainage spontaneously from left buttock near rectum c/w peri rectal abscess  - also has external hemorrhoids on exam  - Surgery noted no indication to pursue I&D at this time  - wound cx positive for Klebsiella sensitive to Zosyn -now on oral abx -has completed a 10 day course as of 11/19 -have asked for [facility] RN to help follow to healing; also has limited mobility due to recent finger surgery  Hypertension -BP currently well controlled;actually soft in setting of aggressive diuresis  H/O aortic valve replacement with tissue graft -no SBE on TEE 07/23/2014  Tenosynovitis of finger -Now s/p amputation - doing well with no signif pain in hand   Patient non adherence -Attending MD had discussed at length with patient 11/5  - okay with plans to transition to insulin at discharge    DVT prophylaxis: SCDs Code Status: FULL Disposition: Transfer to telemetry  Procedures: 11/6 TEE - EF 25-30% - calcified thrombus in LAA 11/12 - right long finger P-2 level amputation under local   Consults: Cardiology/Dr Brackbill Hand Surgery/Dr. Burney Gauze  Antibiotics: Unasyn 11/10> 11/11 Flagyl 11/10 > 11/11 Zosyn 11/11 >11/15 Ceftin 11/15> 11/19  Objective: Blood pressure 137/72, pulse 102, temperature 98.2 F (36.8 C), temperature source Oral, resp. rate 21, height 6' (1.829 m), weight 231 lb 4.2 oz (104.9 kg), SpO2 93 %.  Intake/Output Summary (Last 24 hours) at 08/05/14 1202 Last data filed at 08/05/14 1100  Gross per 24 hour  Intake    510 ml  Output   6178 ml  Net  -5668 ml   Exam: Gen: No acute respiratory distress - alert and conversant  Chest: Faint bilateral  crackles; primarily in bases-has weaned to room air Cardiac: Irregular, slightly tachycardic rate and rhythm, no rubs murmurs or gallops Abdomen: Soft nontender nondistended without obvious hepatosplenomegaly, no ascites Extremities: no signif cyanosis or clubbing - R hand dressed and dry   Scheduled Meds:  Scheduled Meds: . amiodarone  200 mg Oral BID  . antiseptic oral rinse  7 mL Mouth Rinse BID  . apixaban  5 mg Oral BID  . aspirin EC  81 mg Oral Daily  . atorvastatin  10 mg Oral q morning - 10a  . carvedilol  37.5 mg Oral BID WC  . cefUROXime  500 mg Oral BID WC  . docusate sodium  100 mg Oral BID  . furosemide  40 mg Oral BID  . insulin aspart  0-15 Units Subcutaneous TID WC  . insulin aspart  0-5 Units Subcutaneous QHS  . insulin aspart  4 Units Subcutaneous TID WC  . insulin glargine  25 Units Subcutaneous QHS  . losartan  25 mg Oral Daily  . polyethylene glycol  17 g Oral Daily    Data Reviewed: Basic Metabolic Panel:  Recent Labs Lab 08/03/14 0347 08/03/14 2025 08/04/14 0110 08/04/14 0824 08/05/14 0339  NA 131* 127* 128* 126* 130*  K 4.1 4.0 3.9 5.0 4.1  CL 95* 92* 91* 91* 93*  CO2 26 26 28 26 26   GLUCOSE 83 169* 238* 315* 143*  BUN 11 12 13 16  14  CREATININE 0.63 0.65 0.68 0.68 0.74  CALCIUM 8.2* 8.2* 8.3* 8.1* 8.1*  MG  --  2.2  --   --   --      Liver Function Tests:  Recent Labs Lab 07/31/14 0330 08/03/14 2025 08/05/14 0339  AST 12 14 14   ALT 8 9 8   ALKPHOS 103 110 96  BILITOT 0.3 0.3 0.3  PROT 5.9* 6.4 6.1  ALBUMIN 2.1* 2.3* 2.1*   CBC:  Recent Labs Lab 08/01/14 0036 08/02/14 0002 08/03/14 0347 08/03/14 2025 08/05/14 0339  WBC 16.3* 15.8* 15.3* 14.2* 12.9*  NEUTROABS  --   --   --  11.4*  --   HGB 11.4* 11.3* 11.5* 11.0* 11.0*  HCT 35.2* 34.6* 35.6* 33.5* 34.1*  MCV 85.2 85.4 83.4 82.7 82.6  PLT 390 414* 388 385 404*   CBG:  Recent Labs Lab 08/04/14 0804 08/04/14 1157 08/04/14 1556 08/04/14 2015 08/04/14 2132  GLUCAP  316* 333* 270* 211* 188*    Recent Results (from the past 240 hour(s))  Wound culture     Status: None   Collection Time: 07/27/14  9:13 AM  Result Value Ref Range Status   Specimen Description WOUND BUTTOCKS  Final   Special Requests Normal  Final   Gram Stain   Final    FEW WBC PRESENT,BOTH PMN AND MONONUCLEAR NO SQUAMOUS EPITHELIAL CELLS SEEN FEW GRAM NEGATIVE RODS Performed at [facility]    Culture   Final    MODERATE KLEBSIELLA PNEUMONIAE Performed at [facility]    Report Status 07/29/2014 FINAL  Final   Organism ID, Bacteria KLEBSIELLA PNEUMONIAE  Final      Susceptibility   Klebsiella pneumoniae - MIC*    AMPICILLIN >=32 RESISTANT Resistant     AMPICILLIN/SULBACTAM 4 SENSITIVE Sensitive     CEFAZOLIN <=4 SENSITIVE Sensitive     CEFEPIME <=1 SENSITIVE Sensitive     CEFTAZIDIME <=1 SENSITIVE Sensitive     CEFTRIAXONE <=1 SENSITIVE Sensitive     CIPROFLOXACIN <=0.25 SENSITIVE Sensitive     GENTAMICIN <=1 SENSITIVE Sensitive     IMIPENEM <=0.25 SENSITIVE Sensitive     PIP/TAZO <=4 SENSITIVE Sensitive     TOBRAMYCIN <=1 SENSITIVE Sensitive     TRIMETH/SULFA <=20 SENSITIVE Sensitive     * MODERATE KLEBSIELLA PNEUMONIAE  Urine culture     Status: None   Collection Time: 07/28/14  5:39 PM  Result Value Ref Range Status   Specimen Description URINE, RANDOM  Final   Special Requests NONE  Final   Culture  Setup Time   Final    07/28/2014 18:16 Performed at [place]   Final    >=100,000 COLONIES/ML Performed at [facility]    Culture   Final    Multiple bacterial morphotypes present, none predominant. Suggest appropriate recollection if clinically indicated. Performed at [facility]    Report Status 07/29/2014 FINAL  Final  Wound culture     Status: None   Collection Time: 07/29/14  2:58 PM  Result Value Ref Range Status   Specimen Description WOUND FINGER RIGHT  Final   Special Requests NONE   Final   Gram Stain   Final    MODERATE WBC PRESENT,BOTH PMN AND MONONUCLEAR NO SQUAMOUS EPITHELIAL CELLS SEEN FEW GRAM POSITIVE COCCI IN PAIRS RARE GRAM NEGATIVE RODS Performed at [facility]    Culture   Final    MODERATE STAPHYLOCOCCUS AUREUS Note: RIFAMPIN AND GENTAMICIN  SHOULD NOT BE USED AS SINGLE DRUGS FOR TREATMENT OF STAPH INFECTIONS. Performed at [facility]    Report Status 08/01/2014 FINAL  Final   Organism ID, Bacteria STAPHYLOCOCCUS AUREUS  Final      Susceptibility   Staphylococcus aureus - MIC*    CLINDAMYCIN >=8 RESISTANT Resistant     ERYTHROMYCIN >=8 RESISTANT Resistant     GENTAMICIN <=0.5 SENSITIVE Sensitive     LEVOFLOXACIN <=0.12 SENSITIVE Sensitive     OXACILLIN 1 SENSITIVE Sensitive     PENICILLIN >=0.5 RESISTANT Resistant     RIFAMPIN <=0.5 SENSITIVE Sensitive     TRIMETH/SULFA <=10 SENSITIVE Sensitive     VANCOMYCIN <=0.5 SENSITIVE Sensitive     TETRACYCLINE >=16 RESISTANT Resistant     MOXIFLOXACIN <=0.25 SENSITIVE Sensitive     * MODERATE STAPHYLOCOCCUS AUREUS  Anaerobic culture     Status: None   Collection Time: 07/29/14  2:58 PM  Result Value Ref Range Status   Specimen Description WOUND FINGER RIGHT  Final   Special Requests NONE  Final   Gram Stain   Final    MODERATE WBC PRESENT,BOTH PMN AND MONONUCLEAR NO SQUAMOUS EPITHELIAL CELLS SEEN FEW GRAM POSITIVE COCCI IN PAIRS RARE GRAM NEGATIVE RODS Performed at [facility]    Culture   Final    NO ANAEROBES ISOLATED Performed at [facility]    Report Status 08/03/2014 FINAL  Final  Clostridium Difficile by PCR     Status: None   Collection Time: 07/31/14  3:59 PM  Result Value Ref Range Status   C difficile by pcr NEGATIVE NEGATIVE Final     Studies:  Recent x-ray studies have been reviewed in detail by the Attending Physician  Time spent :  40 mins  Erin Hearing, ANP [facility] For Consults/Admissions - Flow Manager -  [phone] Office  [phone] Pager [phone]  On-Call/Text Page:      [email or web address]      password [facility]  08/05/2014, 12:02 PM   LOS: 15 days   Examined patient and discussed assessment and plan with ANP Ebony Hail and agree with above. Patient with multiple complex medical problems> 40 minutes spent on direct patient care

## 2014-08-05 NOTE — Progress Notes (Signed)
Report called to Thomas Purpura, RN on 3E.  Updated on patient history, current status, and plan of care.  Patient is stable and able to transfer at this time.

## 2014-08-05 NOTE — Progress Notes (Signed)
Visited patient at bedside to sign consent form for [facility] Care Management services as benefit of his [employer]. Patient agreeable to services and reminded that these services does not replace any other services needed after discharge.  Natividad Brood, RN, BSN, [facility] Liaison, for questions call [phone].

## 2014-08-06 DIAGNOSIS — D72829 Elevated white blood cell count, unspecified: Secondary | ICD-10-CM

## 2014-08-06 LAB — BASIC METABOLIC PANEL
ANION GAP: 11 (ref 5–15)
BUN: 10 mg/dL (ref 6–23)
CALCIUM: 8.5 mg/dL (ref 8.4–10.5)
CO2: 29 meq/L (ref 19–32)
Chloride: 95 mEq/L — ABNORMAL LOW (ref 96–112)
Creatinine, Ser: 0.63 mg/dL (ref 0.50–1.35)
GFR calc Af Amer: >90 mL/min (ref >90)
GLUCOSE: 196 mg/dL — AB (ref 70–99)
POTASSIUM: 4.2 meq/L (ref 3.7–5.3)
SODIUM: 135 meq/L — AB (ref 137–147)

## 2014-08-06 LAB — GLUCOSE, CAPILLARY
GLUCOSE-CAPILLARY: 145 mg/dL — AB (ref 70–99)
GLUCOSE-CAPILLARY: 207 mg/dL — AB (ref 70–99)
Glucose-Capillary: 231 mg/dL — ABNORMAL HIGH (ref 70–99)
Glucose-Capillary: 176 mg/dL — ABNORMAL HIGH (ref 70–99)
Glucose-Capillary: 182 mg/dL — ABNORMAL HIGH (ref 70–99)
Glucose-Capillary: 255 mg/dL — ABNORMAL HIGH (ref 70–99)

## 2014-08-06 MED ORDER — FUROSEMIDE 40 MG PO TABS
40.0000 mg | ORAL_TABLET | Freq: Every day | ORAL | Status: DC
Start: 2014-08-06 — End: 2014-08-07
  Administered 2014-08-07: 40 mg via ORAL
  Filled 2014-08-06 (×3): qty 1

## 2014-08-06 MED ORDER — INSULIN ASPART 100 UNIT/ML ~~LOC~~ SOLN
6.0000 [IU] | Freq: Three times a day (TID) | SUBCUTANEOUS | Status: DC
  Administered 2014-08-06 – 2014-08-07 (×4): 6 [IU] via SUBCUTANEOUS

## 2014-08-06 NOTE — Progress Notes (Signed)
[name]  Thomas Wright JSH:[id number] DOB: 1946/08/09 DOA: 07/21/2014 PCP: [name], MD  Brief narrative: 68 yo M with PMH of DM, HTN, Afib, h/o bioprosthetic valve replacement in 2008, h/o tenosynovitis and I&D in 8/15 who presented to the ER with generalized malaise, and chest pain, and was found to have Afib with RVR.  Cards admitted him.    He had not been taking his oral hypoglycemics for months - his CBGs had been running in 300s-400s for weeks.  His CBGs have become much better controlled while hospitalized with the use of long-acting insulin. Since plans are to discharge home on insulin Glucotrol was discontinued on 11/9. Pre authorization was completed for Lantus Solostar flexpen and patient was approved; email printed and placed on shadow chart.   Has had leukocytosis since admission. Has been experiencing rectal pain presumed to be hemorrhoidal but developed redness and spontaneous drainage c/w peri rectal abscess. Empiric anbx's started and surgery consulted. Since area spontaneously draining no indications for incision and drainage.  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 Pt has recommended HH PT.  Plans were to discharge the patient on the afternoon of 11/17. Just prior to discharge the patient developed significant respiratory failure consistent with flash pulmonary edema. He was treated with oxygen and IV Lasix with brisk diuresis. Discharge was canceled. After the episode of respiratory failure patient developed significant hypoglycemia with CBG dipping to 36 so all insulins were discontinued. By the morning of 11/18 patient's respiratory status had returned to baseline although he still demonstrated bilateral crackles. He was stable on room air.  HPI/Subjective: No significant changes. Questions re: disability papers that were to have been faxed 11/19.  Assessment/Plan: Acute  respiratory failure with hypoxia due to progressive systolic dysfunction EF 85-02% -EF in March 45-50% with associated grade 2 diastolic dysfunction  - echo this admission demonstrated worsened EF down to 25% (likely mediated by persistent tachycardia)  -during initial portion of hospitalization was able to be weaned off oxygen and had remained euvolemic off Lasix but prior to discharge 11/17 developed acute flash pulmonary edema prompting cancellation of discharge and initiation of IV Lasix -Cards changed to PO Lasix BID as of 11/19; will need to discharge home on oral Lasix (was on 20 mg daily prior to admit) -ARB added back 11/18 at 1/2 preadmission dose -calcium channel blocker discontinued in setting of controlled ventricular rate and known systolic dysfunction -to remain inpatient for further diuresis on BID Lasix-6700 cc out past 24 hours  Diabetic ketoacidosis / diabetes mellitus/hypoglycemia -Suspect initial DKA driven by smouldering perirectal abscess  - Hemoglobin A1c > 11 - now on Lantus - will need insulin at discharge  - diabetes educator assisted this admission - pre authorization for Lantus Flexpen completed 11/6 and email response confirming approval printed and placed in shadow chart  - had severe hypoglycemia with hypothermia on the afternoon of 11/17 with CBG 36 therefore Lantus dosage decreased markedly; meal coverage also decreased-CBGs have recovered for now -11/20 will increase meal coverage to 6 units - needs ACE as soon as BP improved (currently marginally low at times)  Parox Atrial fibrillation with RVR -Per Cardiology  -given systolic dysfunction and adequate rate control with carvedilol cardiology has discontinued Cardizem on 11/18 -now with atrial flutter with variable conduction and episodic tachycardia (11/19)- no changes in meds per Cards  LAA Thrombus -unable to proceed with DCCV at this time -anticipate will  do in next 4 weeks - Eliquis per Cards -copay  $45  Dehydration -Resolved  - Due to DKA   Peri rectal abscess / Hemorrhoids  -developed foul smelling, yellow/tan drainage spontaneously from left buttock near rectum c/w peri rectal abscess  - also has external hemorrhoids on exam  - Surgery noted no indication to pursue I&D at this time  - wound cx positive for Klebsiella sensitive to Zosyn -now on oral abx -has completed a 10 day course as of 11/19 -have asked for [facility] RN to help follow to healing; also has limited mobility due to recent finger surgery  Hypertension -BP currently well controlled;actually soft in setting of aggressive diuresis -ck orthostatic vital signs  H/O aortic valve replacement with tissue graft -no SBE on TEE 07/23/2014  Tenosynovitis of finger -Now s/p amputation - doing well with no signif pain in hand   Patient non adherence -Attending MD had discussed at length with patient 11/5  - okay with plans to transition to insulin at discharge    DVT prophylaxis: SCDs Code Status: FULL Disposition: Telemetry-hopeful can dc in am 11/21  Procedures: 11/6 TEE - EF 25-30% - calcified thrombus in LAA 11/12 - right long finger P-2 level amputation under local   Consults: Cardiology/Dr Brackbill Hand Surgery/Dr. Burney Gauze  Antibiotics: Unasyn 11/10> 11/11 Flagyl 11/10 > 11/11 Zosyn 11/11 >11/15 Ceftin 11/15> 11/19  Objective: Blood pressure 91/43, pulse 84, temperature 97.9 F (36.6 C), temperature source Oral, resp. rate 16, height 6' (1.829 m), weight 232 lb 9.6 oz (105.507 kg), SpO2 97 %.  Intake/Output Summary (Last 24 hours) at 08/06/14 1435 Last data filed at 08/06/14 1402  Gross per 24 hour  Intake   1560 ml  Output   5128 ml  Net  -3568 ml   Exam: Gen: No acute respiratory distress - alert  Chest: Faint bibasilar crackles, room air Cardiac: Irregular, slightly tachycardic rate and rhythm, no rubs murmurs or gallops Abdomen: Soft nontender nondistended without obvious hepatosplenomegaly,  no ascites Extremities: no signif cyanosis or clubbing - R hand dressed and dry   Scheduled Meds:  Scheduled Meds: . amiodarone  200 mg Oral BID  . antiseptic oral rinse  7 mL Mouth Rinse BID  . apixaban  5 mg Oral BID  . atorvastatin  10 mg Oral q morning - 10a  . carvedilol  37.5 mg Oral BID WC  . docusate sodium  100 mg Oral BID  . furosemide  40 mg Oral Q breakfast  . insulin aspart  0-15 Units Subcutaneous TID WC  . insulin aspart  0-5 Units Subcutaneous QHS  . insulin aspart  4 Units Subcutaneous TID WC  . insulin glargine  25 Units Subcutaneous QHS  . losartan  25 mg Oral Daily  . polyethylene glycol  17 g Oral Daily    Data Reviewed: Basic Metabolic Panel:  Recent Labs Lab 08/03/14 2025 08/04/14 0110 08/04/14 0824 08/05/14 0339 08/06/14 0522  NA 127* 128* 126* 130* 135*  K 4.0 3.9 5.0 4.1 4.2  CL 92* 91* 91* 93* 95*  CO2 26 28 26 26 29   GLUCOSE 169* 238* 315* 143* 196*  BUN 12 13 16 14 10   CREATININE 0.65 0.68 0.68 0.74 0.63  CALCIUM 8.2* 8.3* 8.1* 8.1* 8.5  MG 2.2  --   --   --   --      Liver Function Tests:  Recent Labs Lab 07/31/14 0330 08/03/14 2025 08/05/14 0339  AST 12 14 14   ALT 8  9 8  ALKPHOS 103 110 96  BILITOT 0.3 0.3 0.3  PROT 5.9* 6.4 6.1  ALBUMIN 2.1* 2.3* 2.1*   CBC:  Recent Labs Lab 08/01/14 0036 08/02/14 0002 08/03/14 0347 08/03/14 2025 08/05/14 0339  WBC 16.3* 15.8* 15.3* 14.2* 12.9*  NEUTROABS  --   --   --  11.4*  --   HGB 11.4* 11.3* 11.5* 11.0* 11.0*  HCT 35.2* 34.6* 35.6* 33.5* 34.1*  MCV 85.2 85.4 83.4 82.7 82.6  PLT 390 414* 388 385 404*   CBG:  Recent Labs Lab 08/05/14 1628 08/05/14 2054 08/06/14 0549 08/06/14 0650 08/06/14 1112  GLUCAP 303* 252* 176* 182* 255*    Recent Results (from the past 240 hour(s))  Urine culture     Status: None   Collection Time: 07/28/14  5:39 PM  Result Value Ref Range Status   Specimen Description URINE, RANDOM  Final   Special Requests NONE  Final   Culture  Setup  Time   Final    07/28/2014 18:16 Performed at [place]   Final    >=100,000 COLONIES/ML Performed at [facility]    Culture   Final    Multiple bacterial morphotypes present, none predominant. Suggest appropriate recollection if clinically indicated. Performed at [facility]    Report Status 07/29/2014 FINAL  Final  Wound culture     Status: None   Collection Time: 07/29/14  2:58 PM  Result Value Ref Range Status   Specimen Description WOUND FINGER RIGHT  Final   Special Requests NONE  Final   Gram Stain   Final    MODERATE WBC PRESENT,BOTH PMN AND MONONUCLEAR NO SQUAMOUS EPITHELIAL CELLS SEEN FEW GRAM POSITIVE COCCI IN PAIRS RARE GRAM NEGATIVE RODS Performed at [facility]    Culture   Final    MODERATE STAPHYLOCOCCUS AUREUS Note: RIFAMPIN AND GENTAMICIN SHOULD NOT BE USED AS SINGLE DRUGS FOR TREATMENT OF STAPH INFECTIONS. Performed at [facility]    Report Status 08/01/2014 FINAL  Final   Organism ID, Bacteria STAPHYLOCOCCUS AUREUS  Final      Susceptibility   Staphylococcus aureus - MIC*    CLINDAMYCIN >=8 RESISTANT Resistant     ERYTHROMYCIN >=8 RESISTANT Resistant     GENTAMICIN <=0.5 SENSITIVE Sensitive     LEVOFLOXACIN <=0.12 SENSITIVE Sensitive     OXACILLIN 1 SENSITIVE Sensitive     PENICILLIN >=0.5 RESISTANT Resistant     RIFAMPIN <=0.5 SENSITIVE Sensitive     TRIMETH/SULFA <=10 SENSITIVE Sensitive     VANCOMYCIN <=0.5 SENSITIVE Sensitive     TETRACYCLINE >=16 RESISTANT Resistant     MOXIFLOXACIN <=0.25 SENSITIVE Sensitive     * MODERATE STAPHYLOCOCCUS AUREUS  Anaerobic culture     Status: None   Collection Time: 07/29/14  2:58 PM  Result Value Ref Range Status   Specimen Description WOUND FINGER RIGHT  Final   Special Requests NONE  Final   Gram Stain   Final    MODERATE WBC PRESENT,BOTH PMN AND MONONUCLEAR NO SQUAMOUS EPITHELIAL CELLS SEEN FEW GRAM POSITIVE COCCI IN PAIRS RARE GRAM  NEGATIVE RODS Performed at [facility]    Culture   Final    NO ANAEROBES ISOLATED Performed at [facility]    Report Status 08/03/2014 FINAL  Final  Clostridium Difficile by PCR     Status: None   Collection Time: 07/31/14  3:59 PM  Result Value Ref Range Status   C  difficile by pcr NEGATIVE NEGATIVE Final     Studies:  Recent x-ray studies have been reviewed in detail by the Attending Physician  Time spent :  [place], ANP [facility] For Consults/Admissions - Flow Manager - [phone] Office  [phone] Pager [phone]  On-Call/Text Page:      [email or web address]      password [facility]  08/06/2014, 2:35 PM   LOS: 16 days   Examined patient and discussed assessment and plan with ANP Ebony Hail and agree with above. Patient with multiple complex medical problems > 30 minutes spent on direct patient care  Gilles Chiquito, MD

## 2014-08-06 NOTE — Progress Notes (Signed)
Inpatient Diabetes Program Recommendations  AACE/ADA: New Consensus Statement on Inpatient Glycemic Control (2013)  Target Ranges:  Prepandial:   less than 140 mg/dL      Peak postprandial:   less than 180 mg/dL (1-2 hours)      Critically ill patients:  140 - 180 mg/dL  Results for REUBIN, BUSHNELL (MRN [id number]) as of 08/06/2014 13:58  Ref. Range 08/06/2014 05:49 08/06/2014 06:50 08/06/2014 11:12  Glucose-Capillary Latest Range: 70-99 mg/dL 176 (H) 182 (H) 255 (H)   Consider increasing Novolog meal coverage to 6 units TID.  Thank you  Raoul Pitch BSN, RN,CDE Inpatient Diabetes Coordinator [phone] (team pager)

## 2014-08-06 NOTE — Progress Notes (Signed)
Patient Name: Thomas Wright Date of Encounter: 08/06/2014     Principal Problem:   Diabetic ketoacidosis Active Problems:   Cardiomyopathy   H/O aortic valve replacement with tissue graft   Atrial fibrillation with RVR   Hypertension   Tenosynovitis of finger   Dehydration   Patient non adherence   Leukocytosis   LA thrombus   Acute on chronic respiratory failure with hypoxia   Systolic CHF, acute on chronic   Left atrial thrombus   Perirectal abscess   Other hemorrhoids   Noncompliance with medication regimen   Type 2 diabetes mellitus with ketoacidosis without coma   Chest pain   Flash pulmonary edema   Hypoglycemia   Nonspecific abnormal electrocardiogram (ECG) (EKG)   Acute respiratory failure with hypoxia   Acute on chronic systolic heart failure   Paroxysmal atrial fibrillation   Noncompliance with diet and medication regimen    SUBJECTIVE  The patient is feeling well.  No further dyspnea.  Rhythm remains atrial flutter with controlled ventricular response.  Blood pressure is stable.  Weight is stable  CURRENT MEDS . amiodarone  200 mg Oral BID  . antiseptic oral rinse  7 mL Mouth Rinse BID  . apixaban  5 mg Oral BID  . aspirin EC  81 mg Oral Daily  . atorvastatin  10 mg Oral q morning - 10a  . carvedilol  37.5 mg Oral BID WC  . docusate sodium  100 mg Oral BID  . furosemide  40 mg Oral BID  . insulin aspart  0-15 Units Subcutaneous TID WC  . insulin aspart  0-5 Units Subcutaneous QHS  . insulin aspart  4 Units Subcutaneous TID WC  . insulin glargine  25 Units Subcutaneous QHS  . losartan  25 mg Oral Daily  . polyethylene glycol  17 g Oral Daily    OBJECTIVE  Filed Vitals:   08/05/14 2141 08/06/14 0109 08/06/14 0444 08/06/14 0551  BP: 123/57 117/42 128/67 136/70  Pulse: 79 91 97 99  Temp:  98.4 F (36.9 C) 98.1 F (36.7 C)   TempSrc:  Oral Oral   Resp:  16 16   Height:      Weight:   232 lb 9.6 oz (105.507 kg)   SpO2:  98% 98%      Intake/Output Summary (Last 24 hours) at 08/06/14 0819 Last data filed at 08/06/14 5643  Gross per 24 hour  Intake    840 ml  Output   7002 ml  Net  -6162 ml   Filed Weights   08/05/14 0314 08/05/14 1514 08/06/14 0444  Weight: 231 lb 4.2 oz (104.9 kg) 235 lb 1.6 oz (106.641 kg) 232 lb 9.6 oz (105.507 kg)    PHYSICAL EXAM  General: Pleasant, NAD. Neuro: Alert and oriented X 3. Moves all extremities spontaneously. Psych: Normal affect. HEENT:  Normal  Neck: Supple without bruits or JVD. Lungs:  Resp regular and unlabored, CTA. Heart: Irregular.  no s3, s4, or murmurs. Abdomen: Soft, non-tender, non-distended, BS + x 4.  Extremities: No clubbing, cyanosis .  Trace ankle edema.  DP/PT/Radials 2+ and equal bilaterally.  Accessory Clinical Findings  CBC  Recent Labs  08/03/14 2025 08/05/14 0339  WBC 14.2* 12.9*  NEUTROABS 11.4*  --   HGB 11.0* 11.0*  HCT 33.5* 34.1*  MCV 82.7 82.6  PLT 385 329*   Basic Metabolic Panel  Recent Labs  08/03/14 2025  08/05/14 0339 08/06/14 0522  NA 127*  < > 130*  135*  K 4.0  < > 4.1 4.2  CL 92*  < > 93* 95*  CO2 26  < > 26 29  GLUCOSE 169*  < > 143* 196*  BUN 12  < > 14 10  CREATININE 0.65  < > 0.74 0.63  CALCIUM 8.2*  < > 8.1* 8.5  MG 2.2  --   --   --   < > = values in this interval not displayed. Liver Function Tests  Recent Labs  08/03/14 2025 08/05/14 0339  AST 14 14  ALT 9 8  ALKPHOS 110 96  BILITOT 0.3 0.3  PROT 6.4 6.1  ALBUMIN 2.3* 2.1*   No results for input(s): LIPASE, AMYLASE in the last 72 hours. Cardiac Enzymes  Recent Labs  08/03/14 2025 08/04/14 0110 08/04/14 0824  TROPONINI <0.30 <0.30 <0.30   BNP Invalid input(s): POCBNP D-Dimer No results for input(s): DDIMER in the last 72 hours. Hemoglobin A1C No results for input(s): HGBA1C in the last 72 hours. Fasting Lipid Panel No results for input(s): CHOL, HDL, LDLCALC, TRIG, CHOLHDL, LDLDIRECT in the last 72 hours. Thyroid Function  Tests No results for input(s): TSH, T4TOTAL, T3FREE, THYROIDAB in the last 72 hours.  Invalid input(s): FREET3  TELE  Atrial flutter with controlled ventricular response  ECG    Radiology/Studies  Dg Chest 2 View  08/04/2014   CLINICAL DATA:  Atrial fibrillation, pulmonary edema  EXAM: CHEST  2 VIEW  COMPARISON:  08/03/2014 and 08/04/2014  FINDINGS: Cardiomediastinal silhouette is stable. Central mild vascular congestion without pulmonary edema. There is improvement in aeration. Small right pleural effusion with right basilar atelectasis or infiltrate. Trace left pleural effusion.  IMPRESSION: Central mild vascular congestion without pulmonary edema. Improvement in aeration. Small right pleural effusion with right basilar atelectasis or infiltrate. Trace left pleural effusion PICC   Electronically Signed   By: Lahoma Crocker M.D.   On: 08/04/2014 12:04   Ct Angio Chest Pe W/cm &/or Wo Cm  08/04/2014   CLINICAL DATA:  Chest pain and shortness of breath  EXAM: CT ANGIOGRAPHY CHEST WITH CONTRAST  TECHNIQUE: Multidetector CT imaging of the chest was performed using the standard protocol during bolus administration of intravenous contrast. Multiplanar CT image reconstructions and MIPs were obtained to evaluate the vascular anatomy.  CONTRAST:  13mL OMNIPAQUE IOHEXOL 350 MG/ML SOLN  COMPARISON:  None.  FINDINGS: Large right-sided pleural effusion is noted with right lower lobe consolidation. A smaller left sided effusion is noted with left lower lobe consolidation. The thoracic inlet is within normal limits. The thoracic aorta in its branches are unremarkable. No aneurysm or dissection is seen. Changes consistent with prior aortic valve repair are noted. Coronary calcifications are seen. The pulmonary artery is well visualized and within normal branching pattern. No filling defects to suggest pulmonary emboli are noted. Mild central vascular congestion is seen similar to that noted on the prior chest  x-ray. No hilar or mediastinal adenopathy is seen.  Scanning into the upper abdomen reveals stomach to the significantly distended. No acute bony abnormality is noted.  Review of the MIP images confirms the above findings.  IMPRESSION: Changes consistent with CHF with large pleural effusions bilaterally, right greater than left with associated lower lobe consolidation.  No evidence of pulmonary emboli.  Distended stomach of uncertain significance.   Electronically Signed   By: Inez Catalina M.D.   On: 08/04/2014 02:40   Dg Chest Port 1 View  08/03/2014   CLINICAL DATA:  Shortness of  breath  EXAM: PORTABLE CHEST - 1 VIEW  COMPARISON:  07/25/2014  FINDINGS: Cardiac shadow remains enlarged. Diffuse vascular congestion is noted with interstitial edema and mild parenchymal edema consistent with CHF. No acute bony abnormality is seen. No focal infiltrate is noted.  IMPRESSION: Moderate CHF.   Electronically Signed   By: Inez Catalina M.D.   On: 08/03/2014 20:16   Dg Chest Port 1 View  07/25/2014   CLINICAL DATA:  Short of breath. History of cardiomyopathy and aortic valve replacement. History of hypertension.  EXAM: PORTABLE CHEST - 1 VIEW  COMPARISON:  07/23/2014.  FINDINGS: Changes from cardiac surgery are stable. Cardiac silhouette is normal in overall size and configuration. No mediastinal or hilar masses.  Stable elevation the right hemidiaphragm.  Lungs are clear.  No pleural effusion or pneumothorax.  IMPRESSION: No acute cardiopulmonary disease.   Electronically Signed   By: Lajean Manes M.D.   On: 07/25/2014 17:46   Dg Chest Port 1 View  07/23/2014   CLINICAL DATA:  Hypoxia.  EXAM: PORTABLE CHEST - 1 VIEW  COMPARISON:  07/21/2014  FINDINGS: Patient has had median sternotomy. Heart is enlarged. There is mild interstitial edema. No focal consolidations or pleural effusions are identified. Elevation of the right hemidiaphragm is stable.  IMPRESSION: Cardiomegaly and mild edema.   Electronically Signed   By:  Shon Hale M.D.   On: 07/23/2014 08:04   Dg Chest Port 1 View  07/21/2014   CLINICAL DATA:  68 year old male presenting with acute onset of chest pain.  EXAM: PORTABLE CHEST - 1 VIEW  COMPARISON:  Chest x-ray 10/03/2010.  FINDINGS: Lung volumes are low. Chronic elevation of the right hemidiaphragm is unchanged. No consolidative airspace disease. No pleural effusions. No pneumothorax. No pulmonary nodule or mass noted. Pulmonary vasculature and the cardiomediastinal silhouette are within normal limits. Status post median sternotomy.  IMPRESSION: 1. Low lung volumes without radiographic evidence of acute cardiopulmonary disease.   Electronically Signed   By: Vinnie Langton M.D.   On: 07/21/2014 10:23    ASSESSMENT AND PLAN 1. A-fib. TEE with LAA thrombus. Has converted to atrial flutter with variable AV conduction. Continue Eliquis and amiodarone.  I have stopped baby aspirin since on Eliquis. - continue coreg. no cardioversion with LAA thrombus until he has been anticoagulated well for 4 weeks.  2. H/O aortic valve replacement with tissue graft normal gradient and no SBE on TEE on 07/23/2014  3. Acute on chronic systolic HF with recent worsening EF. EF25-30%. Will continue carvedilol,losartan and Lasix.  Previously on Lasix 20 mg daily at home.  We will plan to send him home on 40 mg daily. -  4. Diabetic ketoacidosis - resolved, managed by hospitalist service, started on Lantus, will need to be discharged on it   5. Tenosynovitis of finger Post amputation [name] to see Monday on antibiotics  6. Skin care consult per nurse for rectal drainage Patient has rectal abscess Draining Dr Redmond Pulling has seen 7. AVR no evidence of SBE on TEE Normal gradients   8. Hypothermia: No recurrence. 9. Hyponatremia, resolved.  Plan: Okay for discharge soon.  He will need a follow-up visit with Dr. Marlou Porch or nurse practitioner/PA in 1 week.  He should get a BMET at the  time of his office visit as well as an EKG.  Plan for outpatient TEE cardioversion after he has been anticoagulated for 4 weeks if left atrial thrombus is resolved.  Signed, Darlin Coco MD

## 2014-08-06 NOTE — Progress Notes (Signed)
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    PT Comments    Patient progressing well with mobility, ambulated with and without device and was able to perform stair negotiation this session. VSS throughout session.  Follow Up Recommendations  Home health PT;Supervision/Assistance - 24 hour (pending progress)     Equipment Recommendations  Rolling walker with 5" wheels    Recommendations for Other Services       Precautions / Restrictions Precautions Precautions: Fall Restrictions Weight Bearing Restrictions: No    Mobility  Bed Mobility Overal bed mobility: Needs Assistance Bed Mobility: Rolling;Supine to Sit Rolling: Supervision   Supine to sit: Supervision     General bed mobility comments: Increased time to perform, rolling in both directions, assist with pericare and hygiene   Transfers Overall transfer level: Needs assistance Equipment used: Rolling walker (2 wheeled) Transfers: Sit to/from Stand Sit to Stand: Supervision            Ambulation/Gait Ambulation/Gait assistance: Modified independent (Device/Increase time);Supervision Ambulation Distance (Feet): 210 Feet (120 ft without any device) Assistive device: Rolling walker (2 wheeled);None Gait Pattern/deviations: Step-through pattern;Decreased stride length;Trunk flexed;Drifts right/left Gait velocity: improving Gait velocity interpretation: Below normal speed for age/gender General Gait Details: improved  stability, decreased reliance on RW, able to ambulate without RW for the first time in several days   Stairs Stairs: Yes Stairs assistance: Supervision Stair Management: Two rails;Step to pattern;Forwards Number of Stairs: 6 General stair comments: steady with use of stairs HR 111  Wheelchair Mobility    Modified Rankin (Stroke Patients Only)       Balance     Sitting balance-Leahy Scale: Good       Standing balance-Leahy Scale: Fair                      Cognition Arousal/Alertness: Awake/alert Behavior During Therapy: WFL for tasks assessed/performed Overall Cognitive Status: Within Functional Limits for tasks assessed                      Exercises      General Comments        Pertinent Vitals/Pain Pain Assessment: No/denies pain    Home Living                      Prior Function            PT Goals (current goals can now be found in the care plan section) Acute Rehab PT Goals Patient Stated Goal: to get moving more and go home PT Goal Formulation: With patient Time For Goal Achievement: 08/09/14 Potential to Achieve Goals: Good Progress towards PT goals: Progressing toward goals    Frequency  Min 3X/week    PT Plan Current plan remains appropriate    Co-evaluation             End of Session Equipment Utilized During Treatment: Gait belt Activity Tolerance: Patient tolerated treatment well;Patient limited by fatigue Patient left: in bed;with call bell/phone within reach     Time: 2637-8588 PT Time Calculation (  min) (ACUTE ONLY): 16 min  Charges:  $Gait Training: 8-22 mins                    G CodesDuncan Dull 08/14/14, 4:33 PM [name], [place] DPT  [phone]

## 2014-08-07 LAB — GLUCOSE, CAPILLARY
GLUCOSE-CAPILLARY: 110 mg/dL — AB (ref 70–99)
Glucose-Capillary: 139 mg/dL — ABNORMAL HIGH (ref 70–99)
Glucose-Capillary: 141 mg/dL — ABNORMAL HIGH (ref 70–99)

## 2014-08-07 MED ORDER — INSULIN ASPART 100 UNIT/ML ~~LOC~~ SOLN: 6.0000 [IU] | Freq: Three times a day (TID) | SUBCUTANEOUS | Status: DC

## 2014-08-07 MED ORDER — OXYCODONE HCL 5 MG PO TABS: 5.0000 mg | ORAL_TABLET | Freq: Four times a day (QID) | ORAL | Status: DC | PRN

## 2014-08-07 MED ORDER — LOSARTAN POTASSIUM 50 MG PO TABS: 25.0000 mg | ORAL_TABLET | Freq: Every day | ORAL | Status: DC

## 2014-08-07 NOTE — Plan of Care (Signed)
Problem: Phase I Progression Outcomes Goal: Initial discharge plan identified Outcome: Completed/Met Date Met:  08/07/14 Goal: Hemodynamically stable Outcome: Completed/Met Date Met:  08/07/14 Goal: Other Phase I Outcomes/Goals Outcome: Completed/Met Date Met:  08/07/14  Problem: Phase II Progression Outcomes Goal: Progress activity as tolerated unless otherwise ordered Outcome: Completed/Met Date Met:  08/07/14 Goal: Discharge plan established Outcome: Completed/Met Date Met:  08/07/14 Goal: IV changed to normal saline lock Outcome: Completed/Met Date Met:  08/07/14 Goal: Obtain order to discontinue catheter if appropriate Outcome: Completed/Met Date Met:  08/07/14 Goal: Other Phase II Outcomes/Goals Outcome: Completed/Met Date Met:  08/07/14

## 2014-08-07 NOTE — Progress Notes (Signed)
[name]  Thomas Wright [id number] DOB: March 27, 1946 DOA: 07/21/2014 PCP: [name], MD  Brief narrative: 68 yo M with PMH of DM, HTN, Afib, h/o bioprosthetic valve replacement in 2008, h/o tenosynovitis and I&D in 8/15 who presented to the ER with generalized malaise, and chest pain, and was found to have Afib with RVR.  Cards admitted him.    He had not been taking his oral hypoglycemics for months - his CBGs had been running in 300s-400s for weeks.  His CBGs have become much better controlled while hospitalized with the use of long-acting insulin. Since plans are to discharge home on insulin Glucotrol was discontinued on 11/9. Pre authorization was completed for Lantus Solostar flexpen and patient was approved; email printed and placed on shadow chart.   Has had leukocytosis since admission. Has been experiencing rectal pain presumed to be hemorrhoidal but developed redness and spontaneous drainage c/w peri rectal abscess. Empiric anbx's started and surgery consulted. Since area spontaneously draining no indications for incision and drainage.  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 Pt has recommended HH PT.  Plans were to discharge the patient on the afternoon of 11/17. Just prior to discharge the patient developed significant respiratory failure consistent with flash pulmonary edema. He was treated with oxygen and IV Lasix with brisk diuresis. Discharge was canceled. After the episode of respiratory failure patient developed significant hypoglycemia with CBG dipping to 36 so all insulins were discontinued. By the morning of 11/18 patient's respiratory status had returned to baseline although he still demonstrated bilateral crackles. He was stable on room air.  HPI/Subjective: No significant changes. Questions re: disability papers that were to have been faxed 11/19.  Assessment/Plan: Acute  respiratory failure with hypoxia due to progressive systolic dysfunction EF 89-21% - TTE this admission demonstrated worsened EF down to 25% (likely mediated by persistent tachycardia)  - during initial portion of hospitalization was able to be weaned off oxygen and had remained euvolemic off Lasix but prior to discharge 11/17 developed acute flash pulmonary edema - Cards changed to PO Lasix BID as of 11/19 - Discharged home on lasix 40mg  daily - ARB added back 11/18 at 1/2 preadmission dose - calcium channel blocker discontinued in setting of controlled ventricular rate and known systolic dysfunction  Diabetic ketoacidosis / diabetes mellitus/hypoglycemia - Suspect initial DKA worsened by acute infection with perirectal abscess  - Hemoglobin A1c > 11 - now on Lantus - will need insulin at discharge  - pre authorization for Lantus Flexpen completed 11/6 and email response confirming approval printed and placed in shadow chart  - had severe hypoglycemia with hypothermia on the afternoon of 11/17 with CBG 36 therefore Lantus dosage decreased markedly -11/20 will increase meal coverage to 6 units - needs ACE as soon as BP improved in the outpatient setting.  - Home on lantus 25 units and meal coverage with novolog of 6 units TID  Parox Atrial fibrillation with RVR - Per Cardiology  - On carvedilol and amiodarone, ordered at discharge  LAA Thrombus - unable to proceed with DCCV at this time -anticipate will do in next 4 weeks in the oupatient setting - Eliquis per Cards - Ordered at discharge   Peri rectal abscess / Hemorrhoids  - completed Abx - Home health RN to assist with healing  Hypertension - BP currently well controlled;actually soft in setting of aggressive diuresis - Continue losartan 25mg , coreg 37.5mg  BID  H/O aortic valve replacement with tissue graft - no SBE on TEE 07/23/2014  Tenosynovitis of finger - Now s/p amputation - Discharge on oxycodone for pain - Dressing  change prior to discharge - Plan for outpatient follow up with surgery   DVT prophylaxis: SCDs Code Status: FULL Disposition: D/C today  Procedures: 11/6 TEE - EF 25-30% - calcified thrombus in LAA 11/12 - right long finger P-2 level amputation under local   Consults: Cardiology/Dr Brackbill Hand Surgery/Dr. Burney Gauze  Antibiotics: Unasyn 11/10> 11/11 Flagyl 11/10 > 11/11 Zosyn 11/11 >11/15 Ceftin 11/15> 11/19  Objective: Blood pressure 106/69, pulse 92, temperature 98.2 F (36.8 C), temperature source Oral, resp. rate 16, height 6' (1.829 m), weight 227 lb 8 oz (103.193 kg), SpO2 98 %.  Intake/Output Summary (Last 24 hours) at 08/07/14 0951 Last data filed at 08/07/14 8099  Gross per 24 hour  Intake   1800 ml  Output   5777 ml  Net  -3977 ml   Exam: Gen: No acute respiratory distress, alert and resting comfortably Chest: on Room air, clear to auscultation bilaterally, no wheezing Cardiac: rate seemed more regular today on auscultation, no tachycardia, no extra heart sounds noted Abdomen: Soft nontender nondistended + BS Extremities: no signif cyanosis or clubbing - R hand with dry dressing, intact  Scheduled Meds:  Scheduled Meds: . amiodarone  200 mg Oral BID  . antiseptic oral rinse  7 mL Mouth Rinse BID  . apixaban  5 mg Oral BID  . atorvastatin  10 mg Oral q morning - 10a  . carvedilol  37.5 mg Oral BID WC  . docusate sodium  100 mg Oral BID  . furosemide  40 mg Oral Q breakfast  . insulin aspart  0-15 Units Subcutaneous TID WC  . insulin aspart  0-5 Units Subcutaneous QHS  . insulin aspart  6 Units Subcutaneous TID WC  . insulin glargine  25 Units Subcutaneous QHS  . losartan  25 mg Oral Daily  . polyethylene glycol  17 g Oral Daily    Data Reviewed: Basic Metabolic Panel:  Recent Labs Lab 08/03/14 2025 08/04/14 0110 08/04/14 0824 08/05/14 0339 08/06/14 0522  NA 127* 128* 126* 130* 135*  K 4.0 3.9 5.0 4.1 4.2  CL 92* 91* 91* 93* 95*  CO2 26  28 26 26 29   GLUCOSE 169* 238* 315* 143* 196*  BUN 12 13 16 14 10   CREATININE 0.65 0.68 0.68 0.74 0.63  CALCIUM 8.2* 8.3* 8.1* 8.1* 8.5  MG 2.2  --   --   --   --      Liver Function Tests:  Recent Labs Lab 08/03/14 2025 08/05/14 0339  AST 14 14  ALT 9 8  ALKPHOS 110 96  BILITOT 0.3 0.3  PROT 6.4 6.1  ALBUMIN 2.3* 2.1*   CBC:  Recent Labs Lab 08/01/14 0036 08/02/14 0002 08/03/14 0347 08/03/14 2025 08/05/14 0339  WBC 16.3* 15.8* 15.3* 14.2* 12.9*  NEUTROABS  --   --   --  11.4*  --   HGB 11.4* 11.3* 11.5* 11.0* 11.0*  HCT 35.2* 34.6* 35.6* 33.5* 34.1*  MCV 85.2 85.4 83.4 82.7 82.6  PLT 390 414* 388 385 404*   CBG:  Recent Labs Lab 08/06/14 0650 08/06/14 1112 08/06/14 1613 08/06/14 2119 08/07/14 0556  GLUCAP 182* 255* 145* 207* 110*    Recent Results (from the past 240 hour(s))  Urine culture     Status: None   Collection Time: 07/28/14  5:39 PM  Result Value Ref Range Status   Specimen Description URINE, RANDOM  Final   Special Requests NONE  Final   Culture  Setup Time   Final    07/28/2014 18:16 Performed at [facility]   Final    >=100,000 COLONIES/ML Performed at [facility]    Culture   Final    Multiple bacterial morphotypes present, none predominant. Suggest appropriate recollection if clinically indicated. Performed at [facility]    Report Status 07/29/2014 FINAL  Final  Wound culture     Status: None   Collection Time: 07/29/14  2:58 PM  Result Value Ref Range Status   Specimen Description WOUND FINGER RIGHT  Final   Special Requests NONE  Final   Gram Stain   Final    MODERATE WBC PRESENT,BOTH PMN AND MONONUCLEAR NO SQUAMOUS EPITHELIAL CELLS SEEN FEW GRAM POSITIVE COCCI IN PAIRS RARE GRAM NEGATIVE RODS Performed at [facility]    Culture   Final    MODERATE STAPHYLOCOCCUS AUREUS Note: RIFAMPIN AND GENTAMICIN SHOULD NOT BE USED AS SINGLE DRUGS FOR TREATMENT OF STAPH  INFECTIONS. Performed at [facility]    Report Status 08/01/2014 FINAL  Final   Organism ID, Bacteria STAPHYLOCOCCUS AUREUS  Final      Susceptibility   Staphylococcus aureus - MIC*    CLINDAMYCIN >=8 RESISTANT Resistant     ERYTHROMYCIN >=8 RESISTANT Resistant     GENTAMICIN <=0.5 SENSITIVE Sensitive     LEVOFLOXACIN <=0.12 SENSITIVE Sensitive     OXACILLIN 1 SENSITIVE Sensitive     PENICILLIN >=0.5 RESISTANT Resistant     RIFAMPIN <=0.5 SENSITIVE Sensitive     TRIMETH/SULFA <=10 SENSITIVE Sensitive     VANCOMYCIN <=0.5 SENSITIVE Sensitive     TETRACYCLINE >=16 RESISTANT Resistant     MOXIFLOXACIN <=0.25 SENSITIVE Sensitive     * MODERATE STAPHYLOCOCCUS AUREUS  Anaerobic culture     Status: None   Collection Time: 07/29/14  2:58 PM  Result Value Ref Range Status   Specimen Description WOUND FINGER RIGHT  Final   Special Requests NONE  Final   Gram Stain   Final    MODERATE WBC PRESENT,BOTH PMN AND MONONUCLEAR NO SQUAMOUS EPITHELIAL CELLS SEEN FEW GRAM POSITIVE COCCI IN PAIRS RARE GRAM NEGATIVE RODS Performed at [facility]    Culture   Final    NO ANAEROBES ISOLATED Performed at [facility]    Report Status 08/03/2014 FINAL  Final  Clostridium Difficile by PCR     Status: None   Collection Time: 07/31/14  3:59 PM  Result Value Ref Range Status   C difficile by pcr NEGATIVE NEGATIVE Final     Studies:  Recent x-ray studies have been reviewed in detail by the Attending Physician  Time spent :  40 mins   Chesnie Capell, MD

## 2014-08-07 NOTE — Progress Notes (Addendum)
Subjective:  No new complaints, feels good, no SOB, no CP  Objective:  Vital Signs in the last 24 hours: Temp:  [97.9 F (36.6 C)-98.6 F (37 C)] 98.2 F (36.8 C) (11/21 0557) Pulse Rate:  [61-122] 92 (11/21 0605) Resp:  [16-18] 16 (11/21 0557) BP: (106-124)/(48-84) 106/69 mmHg (11/21 0557) SpO2:  [97 %-98 %] 98 % (11/21 0557) Weight:  [227 lb 8 oz (103.193 kg)] 227 lb 8 oz (103.193 kg) (11/21 0455)  Intake/Output from previous day: 11/20 0701 - 11/21 0700 In: 2040 [P.O.:2040] Out: 4178 [Urine:4175; Stool:3]   Physical Exam: General: Well developed, well nourished, in no acute distress. Head:  Normocephalic and atraumatic. Lungs: Clear to auscultation and percussion. Heart: Irreg irreg.  Soft sytolic murmur, rubs or gallops.  Abdomen: soft, non-tender, positive bowel sounds. Extremities: No clubbing or cyanosis. No edema. Right hand dressing Neurologic: Alert and oriented x 3.    Lab Results:  Recent Labs  08/05/14 0339  WBC 12.9*  HGB 11.0*  PLT 404*    Recent Labs  08/05/14 0339 08/06/14 0522  NA 130* 135*  K 4.1 4.2  CL 93* 95*  CO2 26 29  GLUCOSE 143* 196*  BUN 14 10  CREATININE 0.74 0.63   No results for input(s): TROPONINI in the last 72 hours.  Invalid input(s): CK, MB Hepatic Function Panel  Recent Labs  08/05/14 0339  PROT 6.1  ALBUMIN 2.1*  AST 14  ALT 8  ALKPHOS 96  BILITOT 0.3    Telemetry: Atrial fib, controlled rate Personally viewed.   Cardiac Studies:  ECHO 25%  Scheduled Meds: . amiodarone  200 mg Oral BID  . antiseptic oral rinse  7 mL Mouth Rinse BID  . apixaban  5 mg Oral BID  . atorvastatin  10 mg Oral q morning - 10a  . carvedilol  37.5 mg Oral BID WC  . docusate sodium  100 mg Oral BID  . furosemide  40 mg Oral Q breakfast  . insulin aspart  0-15 Units Subcutaneous TID WC  . insulin aspart  0-5 Units Subcutaneous QHS  . insulin aspart  6 Units Subcutaneous TID WC  . insulin glargine  25 Units Subcutaneous  QHS  . losartan  25 mg Oral Daily  . polyethylene glycol  17 g Oral Daily   Continuous Infusions:  PRN Meds:.acetaminophen, ALPRAZolam, Influenza vac split quadrivalent PF, ondansetron (ZOFRAN) IV, oxyCODONE, sodium chloride   Assessment/Plan:   1. A-fib. TEE with LAA thrombus. Currently in atrial flutter with variable AV conduction. Continue Eliquis and amiodarone. I have stopped baby aspirin since on Eliquis. - continue coreg. no cardioversion with LAA thrombus until he has been anticoagulated well for 4 weeks.  2. H/O aortic valve replacement with tissue graft normal gradient and no SBE on TEE on 07/23/2014  3. Acute on chronic systolic HF with recent worsening EF. EF25-30%. Will continue carvedilol,losartan and Lasix. Previously on Lasix 20 mg daily at home. We will plan to send him home on 40 mg daily.  4. Diabetic ketoacidosis - resolved, managed by hospitalist service, started on Lantus, will need to be discharged on it   5. Tenosynovitis of finger Post amputation [name] to see Monday on antibiotics   6. Skin care consult per nurse for rectal drainage Patient has rectal abscess Draining Dr Redmond Pulling has seen  7. AVR no evidence of SBE on TEE Normal gradients   8. Hypothermia: No recurrence.  9. Hyponatremia, resolved.  Plan: Okay for discharge from  cardiology standpoint. Follow-up visit with Dr. Marlou Porch or nurse practitioner/PA in 1 week. He should get a BMET at the time of his office visit as well as an EKG.   Plan for outpatient TEE cardioversion after he has been anticoagulated for 4 weeks if left atrial thrombus is resolved.    Kyrillos Adams, [place] 08/07/2014, 11:28 AM

## 2014-08-09 ENCOUNTER — Other Ambulatory Visit: Payer: Self-pay | Admitting: *Deleted

## 2014-08-09 ENCOUNTER — Telehealth: Payer: Self-pay | Admitting: Cardiology

## 2014-08-09 MED ORDER — LOSARTAN POTASSIUM 50 MG PO TABS: 25.0000 mg | ORAL_TABLET | Freq: Every day | ORAL | Status: DC

## 2014-08-09 MED ORDER — AMIODARONE HCL 200 MG PO TABS: 200.0000 mg | ORAL_TABLET | Freq: Two times a day (BID) | ORAL | Status: DC

## 2014-08-09 MED ORDER — ATORVASTATIN CALCIUM 10 MG PO TABS: 10.0000 mg | ORAL_TABLET | Freq: Every morning | ORAL | Status: DC

## 2014-08-09 MED ORDER — FUROSEMIDE 20 MG PO TABS: ORAL_TABLET | ORAL | Status: DC

## 2014-08-09 MED ORDER — CARVEDILOL 25 MG PO TABS: ORAL_TABLET | ORAL | Status: DC

## 2014-08-09 NOTE — Telephone Encounter (Signed)
PT CALLING RE FREE MONTH CARD FROM [employer], PT STATES HE CANNOT AFFORD THIS AND THE COMPANY'S AUTOMATED SYSTEM STATES HE DOES NOT QUALIFY, HE RUNS OUT TOMORROW, WHAT TO DO

## 2014-08-09 NOTE — Telephone Encounter (Signed)
Left message for pt to call back to discuss Eliquis.  There are samples Lot [id number] exp 09/2016 for 2 weeks.  Also have a card for a free 30 day supply but the pt will need another RX and not have it ran against his insurance.  He needs a f/u appt as well

## 2014-08-10 ENCOUNTER — Telehealth: Payer: Self-pay | Admitting: Cardiology

## 2014-08-10 NOTE — Telephone Encounter (Signed)
New message   appt on  12/8 @ 2:30   Please have transitional care appt with me or APP in one week. BMET at visit.   [name], MD

## 2014-08-11 NOTE — Telephone Encounter (Signed)
Patient contacted regarding discharge from [facility] on 07/23/14.  Patient understands to follow up with provider Dr. Marlou Porch on 08/24/14 at 2:30 PM  [facility] heart care at [facility] street office. Patient understands discharge instructions?Yes Patient understands medications and regiment?Yes  Patient understands to bring all medications to this visit? Yes   Pt states his BP when he got up was 90/44 HR 92 beas/minute. Pt is aware to take his BP prior taking his medications. He is to call the office if needed.

## 2014-08-11 NOTE — Telephone Encounter (Signed)
Called and spoke with pt - he did get his medication and is aware of his follow up appt.

## 2014-08-16 ENCOUNTER — Telehealth: Payer: Self-pay | Admitting: Cardiology

## 2014-08-16 NOTE — Telephone Encounter (Signed)
New message     Can pt have his teeth cleaned 08-19-14?  He was recently in the hospital.

## 2014-08-16 NOTE — Telephone Encounter (Signed)
Will double check with Dr Marlou Porch since HX of AV replacement

## 2014-08-17 ENCOUNTER — Encounter: Payer: Self-pay | Admitting: Cardiology

## 2014-08-17 NOTE — Telephone Encounter (Signed)
Yes. Amoxicillin 2g prior

## 2014-08-17 NOTE — Telephone Encounter (Signed)
Pt aware - states he has the medication at home he thinks but if he needs to, he will call back.

## 2014-08-18 ENCOUNTER — Encounter: Payer: Self-pay | Admitting: *Deleted

## 2014-08-18 ENCOUNTER — Encounter: Payer: Self-pay | Admitting: Cardiology

## 2014-08-18 ENCOUNTER — Ambulatory Visit (INDEPENDENT_AMBULATORY_CARE_PROVIDER_SITE_OTHER): Payer: Commercial Managed Care - HMO | Admitting: Cardiology

## 2014-08-18 VITALS — BP 118/64 | HR 96 | Ht 72.0 in | Wt 208.8 lb

## 2014-08-18 DIAGNOSIS — Z7901 Long term (current) use of anticoagulants: Secondary | ICD-10-CM

## 2014-08-18 DIAGNOSIS — I4891 Unspecified atrial fibrillation: Secondary | ICD-10-CM

## 2014-08-18 DIAGNOSIS — I4892 Unspecified atrial flutter: Secondary | ICD-10-CM

## 2014-08-18 DIAGNOSIS — I951 Orthostatic hypotension: Secondary | ICD-10-CM

## 2014-08-18 MED ORDER — CARVEDILOL 25 MG PO TABS: 12.5000 mg | ORAL_TABLET | Freq: Two times a day (BID) | ORAL | Status: DC

## 2014-08-18 NOTE — Progress Notes (Signed)
0     [address], [place], [place]  [address] Phone: [phone] Fax:  [phone]  Date:  08/18/2014   ID:  Thomas Wright, DOB Mar 02, 1946, MRN [id number]  PCP:  [name], MD   History of Present Illness: Thomas Wright is a 68 y.o. 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 Drema Dallas on 08/17/14 with blood pressure in the 38B/33O systolic.   There was confusion on his medicine discharge reconciliation. Dr. Drema Dallas stated that he was taking 50 mg of losartan, lisinopril as well, 25 mg of carvedilol twice a day, 40 mg of furosemide. Hypotension was noted. I discussed with her over phone. He was also on amiodarone 200 mg twice a day, loading.  Today he is feeling better, less lethargic. He did not have any syncope with his very low blood pressure yesterday. Minor sore throat, no cough, no fever.  Ultimately, plan was to continue with anticoagulation for 4 weeks and then plan for cardioversion with TEE. Last cardioversion was canceled because of left atrial appendage thrombus.   His hospitalization also was prompted by diabetic ketoacidosis with hemoglobin A1c of greater than 11. He also had perirectal abscess. Tina's in [place] of finger.  Previously in March 2015 his ejection fraction was improved to 45-50% however on arrival to hospital he was tachycardic with heart rate in the 130 range and developed flash pulmonary edema.   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 Because of this, a CT angiogram was performed which was reassuring only showing 42 mm. He was reassured. Unfortunately, he is going through a large amount of stress in  his life, his wife is no longer with him. 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   Recent skin infections. On Doxy. ?esophagitis  Aortic valve repair- 2008. Had cardiomyopathy at the time 20% No CAD. EF. Jan 2009 - cardioverted. Current ejection fraction 45%  with aortic root dimension of 4.5 cm. no longer on amiodarone.   Diabetes- prior hemoglobin A1c 10.4-uncontrolled. Working closely with Dr. Drema Dallas  Hyperlipidemia-triglycerides 259, LDL 99, likely falsely lowered by elevated triglycerides. Fish oil will improve this. Continue to exercise, diet. I agree that prescription medicine may be needed in the future. His triglycerides will be challenging to control in the setting of uncontrolled diabetes however.   Wt Readings from Last 3 Encounters:  08/18/14 208 lb 12.8 oz (94.711 kg)  08/07/14 227 lb 8 oz (103.193 kg)  12/15/13 199 lb (90.266 kg)     Past Medical History  Diagnosis Date  . Diabetes mellitus   . Hypertension   . Atrial fibrillation   . Hypertriglyceridemia   . Afib     Stopped amiodarone 2013-no further episodes of atrial fibrillation. Postoperative 2008  . H/O aortic valve replacement with tissue graft     2008.  . Back pain   . Cardiomyopathy     EF originally 20%-now 45%, no CAD on cath    Past Surgical History  Procedure Laterality Date  . Cardiac surgery  05/2007  Aortic Valve Replacement  . I&d extremity Right 05/05/2014    Procedure: IRRIGATION AND DEBRIDEMENT EXTREMITY;  Surgeon: [name], MD;  Location: [facility];  Service: Orthopedics;  Laterality: Right;  . Tee without cardioversion N/A 07/23/2014    Procedure: TRANSESOPHAGEAL ECHOCARDIOGRAM (TEE);  Surgeon: Josue Hector, MD;  Location: [facility] ENDOSCOPY;  Service: Cardiovascular;  Laterality: N/A;  .  Cardioversion N/A 07/23/2014    Procedure: CARDIOVERSION;  Surgeon: Josue Hector, MD;  Location: [facility];  Service: Cardiovascular;  Laterality: N/A;  . Amputation Right 07/29/2014    Procedure: AMPUTATION RIGHT LONG FINGER;  Surgeon: [name], MD;  Location: [facility];  Service: Orthopedics;  Laterality: Right;    Current Outpatient Prescriptions  Medication Sig Dispense Refill  . amiodarone (PACERONE) 200 MG tablet Take 1 tablet (200 mg total) by mouth 2 (two) times daily. 180 tablet 0  . amoxicillin (AMOXIL) 500 MG capsule Take 500 mg by mouth daily as needed. Before dental appointment    . apixaban (ELIQUIS) 5 MG TABS tablet Take 1 tablet (5 mg total) by mouth 2 (two) times daily. 60 tablet 0  . atorvastatin (LIPITOR) 10 MG tablet Take 1 tablet (10 mg total) by mouth every morning. 90 tablet 0  . carvedilol (COREG) 25 MG tablet Take 1 1/2 tablets twice daily 270 tablet 0  . furosemide (LASIX) 20 MG tablet TAKE TWO 20 MG TABLETS DAILY 180 tablet 0  . glipiZIDE (GLUCOTROL) 5 MG tablet Take 5 mg by mouth daily before breakfast.    . losartan (COZAAR) 50 MG tablet Take 0.5 tablets (25 mg total) by mouth daily. 45 tablet 0  . oxyCODONE (OXY IR/ROXICODONE) 5 MG immediate release tablet Take 1 tablet (5 mg total) by mouth every 6 (six) hours as needed for moderate pain. 30 tablet 0  . ALPRAZolam (XANAX) 0.25 MG tablet Take 1 tablet (0.25 mg total) by mouth 3 (three) times daily as needed for anxiety. (Patient not taking: Reported on 08/18/2014) 30 tablet 0  . insulin aspart (NOVOLOG) 100 UNIT/ML injection Inject 6 Units into the skin 3 (three) times daily with meals. (Patient not taking: Reported on 08/18/2014) 10 mL 11  . insulin glargine (LANTUS) 100 UNIT/ML injection Inject 0.25 mLs (25 Units total) into the skin at bedtime. (Patient not taking: Reported on 08/18/2014) 10 mL 11  . Insulin Pen Needle (RELION PEN NEEDLE 31G/8MM) 31G X 8 MM MISC Use with Lantus solostar flexpen to administer  insulin (Patient not taking: Reported on 08/18/2014) 100 each 0  . Insulin Syringe-Needle U-100 (RELION INSULIN SYR 0.3CC/30G) 30G X 5/16" 0.3 ML MISC Use with Novolog nsulin to administer meal coverage insulin (Patient not taking: Reported on 08/18/2014) 300 each 0  . polyethylene glycol (MIRALAX / GLYCOLAX) packet Take 17 g by mouth daily. (Patient not taking: Reported on 08/18/2014) 14 each 0   No current facility-administered medications for this visit.    Allergies:   No Known Allergies  Social History:  The patient  reports that he has never smoked. He has never used smokeless tobacco. He reports that he does not drink alcohol or use illicit drugs.   ROS:  Please see the history of present illness.   Denies any fevers, chills, orthopnea, PND    PHYSICAL EXAM: VS:  BP 118/64 mmHg  Pulse 96  Ht 6' (1.829 m)  Wt 208 lb 12.8 oz (94.711 kg)  BMI 28.31 kg/m2 Well nourished, well developed, in no acute distress HEENT: normal Neck: no JVD Cardiac:  normal S1, S2; irregularly irregular; no significant murmur Lungs:  clear to auscultation bilaterally, no wheezing, rhonchi or rales Abd: soft, nontender, no hepatomegaly Ext: no edema Skin: warm and dry, hand/finger currently dressed. Neuro: no focal abnormalities noted  EKG: 08/18/14-atrial flutter (difficult to see P waves but they are buried within) with variable conduction, heart rate 95 bpm. Left bundle branch block, QTC 510 ms. 12/15/13-sinus rhythm, LVH, prolonged QT 489 ms QTC.   ECHO 07/22/14 - EF 25-30%  (setting of recent tachycardia) ECHO 12/08/13: -  45% to 50%.  - Aortic valve: A bioprosthesis was present. Trivial regurgitation. - Aortic root: The aortic root was moderately dilated. - Mitral valve: Calcified annulus. - Left atrium: The atrium was moderately dilated. Impressions:  CTA 12/14/13 -  Mild dilatation of the proximal aorta at the sinuses of Valsalva, measuring 4.2 cm in estimated diameter. The rest of the  thoracic aorta is of normal caliber. No dissection is identified. Scattered calcified plaque is identified in the distribution of the LAD.   Nuclear stress test 11/2012-no ischemia, ejection fraction 42%  Labs: 08/06/14-sodium 135 (as low as 126 during hospitalization), 4.2 potassium, creatinine 0.63, hemoglobin 11, white count of 12.9, troponin normal, TSH 0.5   ASSESSMENT AND PLAN:  1. Hypotension-lisinopril, losartan, furosemide were held yesterday. Carvedilol was decreased to 6.25 mg twice a day, amiodarone continued. Gatorade. Feeling better. I will change his carvedilol to 12.5 mg twice a day. Continue to hold other medications. 2. Dilated aortic root. CT angiogram was reassuring. This in fact only showed 42 mm. Echocardiogram had been estimating 45-47 mm Sinus Valsalva. We will continue to monitor periodically with echocardiogram. Reassurance. Continue with aggressive pressure control.  3. Aortic valve replacement-doing well. No symptoms. Echocardiogram reassuring. Dental prophylaxis. 4. Hypertension-under good control, in fact recent hypotension. 5. Cardiomyopathy-in part tachycardia induced with most recent ejection fraction 25-30%, previously this year 45-50% EF. During aortic valve replacement, EF was 20%. 6. Atrial flutter- amiodarone 200 mg twice a day. Attempted TEE cardioversion 07/23/14-left atrial appendage thrombus. Currently rate controlled, heart rate in the 90s. We will go ahead and schedule him for TEE cardioversion on December 16. For a period of time he was off of amiodarone. Back on amiodarone after recent hospitalization November 2015 with atrial flutter with rapid ventricular response. 7. Diabetes-working diligently on this. Stress the importance of this. Hemoglobin A1c greater than 11. 8. One month.  Signed, [name], MD [facility]  08/18/2014 8:31 AM

## 2014-08-18 NOTE — Patient Instructions (Signed)
Please decrease your Carvedilol to 25 mg 1/2 tablet twice a day. Stop Losartan and hold Furosemide for now. Continue all other medications as listed.  Your physician has requested that you have a TEE/Cardioversion. During a TEE, sound waves are used to create images of your heart. It provides your doctor with information about the size and shape of your heart and how well your heart's chambers and valves are working. In this test, a transducer is attached to the end of a flexible tube that is guided down you throat and into your esophagus (the tube leading from your mouth to your stomach) to get a more detailed image of your heart. Once the TEE has determined that a blood clot is not present, the cardioversion begins. Electrical Cardioversion uses a jolt of electricity to your heart either through paddles or wired patches attached to your chest. This is a controlled, usually prescheduled, procedure. This procedure is done at the hospital and you are not awake during the procedure. You usually go home the day of the procedure. Please see the instruction sheet given to you today for more information.  Please return for blood work around 12/10. (CBC,BMP)  Follow up with Dr Marlou Porch approximately 2 weeks after the cardioversion.

## 2014-08-20 ENCOUNTER — Telehealth: Payer: Self-pay | Admitting: Cardiology

## 2014-08-20 NOTE — Telephone Encounter (Signed)
New Msg   Pt calling in regards to paper work that he needs to have faxed in to his job to have clearance to go back to work. Pt states paperwork was received but they couldn't read it and it needs to be faxed again. [phone].

## 2014-08-20 NOTE — Telephone Encounter (Signed)
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  He needs it ASAP because if he does not get the form to his job by tues---he will be terminated.  Please call

## 2014-08-20 NOTE — Telephone Encounter (Signed)
Paperwork was re-faxed at 3 by medical records - pt is aware.  He will call back on Monday if they didn't receive it.

## 2014-08-20 NOTE — Telephone Encounter (Signed)
Paperwork completed - taken to MR to be faxed - pt aware

## 2014-08-24 ENCOUNTER — Encounter: Payer: Commercial Managed Care - HMO | Admitting: Nurse Practitioner

## 2014-08-24 ENCOUNTER — Telehealth: Payer: Self-pay | Admitting: Cardiology

## 2014-08-24 NOTE — Telephone Encounter (Signed)
Spoke with patient about TEE/Cardioversion.

## 2014-08-24 NOTE — Telephone Encounter (Signed)
New Message  Pt called wanting to know how long the endo procedure on 12/16 will take. Please call back on home or mobile and discuss.

## 2014-08-26 ENCOUNTER — Other Ambulatory Visit (INDEPENDENT_AMBULATORY_CARE_PROVIDER_SITE_OTHER): Payer: Commercial Managed Care - HMO | Admitting: *Deleted

## 2014-08-26 DIAGNOSIS — Z7901 Long term (current) use of anticoagulants: Secondary | ICD-10-CM

## 2014-08-26 DIAGNOSIS — I4892 Unspecified atrial flutter: Secondary | ICD-10-CM

## 2014-08-26 LAB — BASIC METABOLIC PANEL
BUN: 21 mg/dL (ref 6–23)
CHLORIDE: 101 meq/L (ref 96–112)
CO2: 25 mEq/L (ref 19–32)
CREATININE: 0.9 mg/dL (ref 0.4–1.5)
Calcium: 9 mg/dL (ref 8.4–10.5)
GFR: 87.25 mL/min (ref >60.00)
Glucose, Bld: 140 mg/dL — ABNORMAL HIGH (ref 70–99)
Potassium: 4.6 mEq/L (ref 3.5–5.1)
Sodium: 132 mEq/L — ABNORMAL LOW (ref 135–145)

## 2014-08-26 LAB — CBC
HEMATOCRIT: 38.3 % — AB (ref 39.0–52.0)
HEMOGLOBIN: 12.7 g/dL — AB (ref 13.0–17.0)
MCHC: 33.2 g/dL (ref 30.0–36.0)
MCV: 81.8 fl (ref 78.0–100.0)
Platelets: 390 10*3/uL (ref 150.0–400.0)
RBC: 4.68 Mil/uL (ref 4.22–5.81)
RDW: 14.3 % (ref 11.5–15.5)
WBC: 8.4 10*3/uL (ref 4.0–10.5)

## 2014-08-31 ENCOUNTER — Other Ambulatory Visit: Payer: Self-pay | Admitting: Cardiology

## 2014-08-31 DIAGNOSIS — I4892 Unspecified atrial flutter: Secondary | ICD-10-CM

## 2014-09-01 ENCOUNTER — Encounter (HOSPITAL_COMMUNITY): Payer: Self-pay | Admitting: *Deleted

## 2014-09-01 ENCOUNTER — Encounter (HOSPITAL_COMMUNITY)
Admission: RE | Disposition: A | Discharge status: Home / Self Care | Payer: Self-pay | Source: Ambulatory Visit | Attending: Cardiology

## 2014-09-01 ENCOUNTER — Ambulatory Visit (HOSPITAL_COMMUNITY): Payer: Commercial Managed Care - HMO | Admitting: Anesthesiology

## 2014-09-01 ENCOUNTER — Ambulatory Visit (HOSPITAL_COMMUNITY)
Admission: RE | Admit: 2014-09-01 | Discharge: 2014-09-01 | Disposition: A | Discharge status: Home / Self Care | Payer: Commercial Managed Care - HMO | Source: Ambulatory Visit | Attending: Cardiology | Admitting: Cardiology

## 2014-09-01 DIAGNOSIS — I4892 Unspecified atrial flutter: Secondary | ICD-10-CM

## 2014-09-01 DIAGNOSIS — I340 Nonrheumatic mitral (valve) insufficiency: Secondary | ICD-10-CM

## 2014-09-01 DIAGNOSIS — I513 Intracardiac thrombosis, not elsewhere classified: Secondary | ICD-10-CM | POA: Insufficient documentation

## 2014-09-01 DIAGNOSIS — I447 Left bundle-branch block, unspecified: Secondary | ICD-10-CM | POA: Diagnosis not present

## 2014-09-01 DIAGNOSIS — I440 Atrioventricular block, first degree: Secondary | ICD-10-CM | POA: Diagnosis not present

## 2014-09-01 DIAGNOSIS — I10 Essential (primary) hypertension: Secondary | ICD-10-CM | POA: Diagnosis not present

## 2014-09-01 DIAGNOSIS — Z794 Long term (current) use of insulin: Secondary | ICD-10-CM | POA: Diagnosis not present

## 2014-09-01 DIAGNOSIS — E1165 Type 2 diabetes mellitus with hyperglycemia: Secondary | ICD-10-CM | POA: Diagnosis not present

## 2014-09-01 DIAGNOSIS — Z952 Presence of prosthetic heart valve: Secondary | ICD-10-CM | POA: Insufficient documentation

## 2014-09-01 HISTORY — PX: CARDIOVERSION: SHX1299

## 2014-09-01 HISTORY — PX: TEE WITHOUT CARDIOVERSION: SHX5443

## 2014-09-01 LAB — GLUCOSE, CAPILLARY: Glucose-Capillary: 142 mg/dL — ABNORMAL HIGH (ref 70–99)

## 2014-09-01 SURGERY — ECHOCARDIOGRAM, TRANSESOPHAGEAL
Anesthesia: General

## 2014-09-01 MED ORDER — BUTAMBEN-TETRACAINE-BENZOCAINE 2-2-14 % EX AERO
INHALATION_SPRAY | CUTANEOUS | Status: DC | PRN
  Administered 2014-09-01: 2 via TOPICAL

## 2014-09-01 MED ORDER — SODIUM CHLORIDE 0.9 % IV SOLN: INTRAVENOUS | Status: DC

## 2014-09-01 MED ORDER — PROPOFOL INFUSION 10 MG/ML OPTIME
INTRAVENOUS | Status: DC | PRN
  Administered 2014-09-01: 45 ug/kg/min via INTRAVENOUS

## 2014-09-01 NOTE — CV Procedure (Signed)
    Electrical Cardioversion Procedure Note Thomas Wright [id number] 01/14/1946  Procedure: Electrical Cardioversion Indications:  Atrial Fibrillation  Time Out: Verified patient identification, verified procedure,medications/allergies/relevent history reviewed, required imaging and test results available.  Performed  Procedure Details  The patient was NPO after midnight. Anesthesia was administered at the beside  by Dr.Joslin with  propofol.  Cardioversion was performed with synchronized biphasic defibrillation via AP pads with 150 joules.  1 attempt(s) were performed.  The patient converted to normal sinus rhythm. The patient tolerated the procedure well   IMPRESSION:  Successful cardioversion of atrial fibrillation. Transesophageal echocardiogram showed no evidence of left atrial appendage thrombus. Ejection fraction in the 30% range. Mild MR. No aortic regurgitation.    SKAINS, [place] 09/01/2014, 12:04 PM

## 2014-09-01 NOTE — Interval H&P Note (Signed)
History and Physical Interval Note:  09/01/2014 11:46 AM  Thomas Wright  has presented today for surgery, with the diagnosis of aflutter  The various methods of treatment have been discussed with the patient and family. After consideration of risks, benefits and other options for treatment, the patient has consented to  Procedure(s): TRANSESOPHAGEAL ECHOCARDIOGRAM (TEE) (N/A) CARDIOVERSION (N/A) as a surgical intervention .  The patient's history has been reviewed, patient examined, no change in status, stable for surgery.  I have reviewed the patient's chart and labs.  Questions were answered to the patient's satisfaction.     Larua Collier

## 2014-09-01 NOTE — H&P (View-Only) (Signed)
0     [address] [place], [place]  [address] Phone: [phone] Fax:  [phone]  Date:  08/18/2014   ID:  Thomas Wright, DOB 02/13/1946, MRN [id number]  PCP:  [name], MD   History of Present Illness: Thomas Wright is a 68 y.o. 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 Drema Dallas on 08/17/14 with blood pressure in the 99B/71I systolic.   There was confusion on his medicine discharge reconciliation. Dr. Drema Dallas stated that he was taking 50 mg of losartan, lisinopril as well, 25 mg of carvedilol twice a day, 40 mg of furosemide. Hypotension was noted. I discussed with her over phone. He was also on amiodarone 200 mg twice a day, loading.  Today he is feeling better, less lethargic. He did not have any syncope with his very low blood pressure yesterday. Minor sore throat, no cough, no fever.  Ultimately, plan was to continue with anticoagulation for 4 weeks and then plan for cardioversion with TEE. Last cardioversion was canceled because of left atrial appendage thrombus.   His hospitalization also was prompted by diabetic ketoacidosis with hemoglobin A1c of greater than 11. He also had perirectal abscess. Tina's in [place] of finger.  Previously in March 2015 his ejection fraction was improved to 45-50% however on arrival to hospital he was tachycardic with heart rate in the 130 range and developed flash pulmonary edema.   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 Because of this, a CT angiogram was performed which was reassuring only showing 42 mm. He was reassured. Unfortunately, he is going through a large amount of stress in  his life, his wife is no longer with him. 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   Recent skin infections. On Doxy. ?esophagitis  Aortic valve repair- 2008. Had cardiomyopathy at the time 20% No CAD. EF. Jan 2009 - cardioverted. Current ejection fraction 45%  with aortic root dimension of 4.5 cm. no longer on amiodarone.   Diabetes- prior hemoglobin A1c 10.4-uncontrolled. Working closely with Dr. Drema Dallas  Hyperlipidemia-triglycerides 259, LDL 99, likely falsely lowered by elevated triglycerides. Fish oil will improve this. Continue to exercise, diet. I agree that prescription medicine may be needed in the future. His triglycerides will be challenging to control in the setting of uncontrolled diabetes however.   Wt Readings from Last 3 Encounters:  08/18/14 208 lb 12.8 oz (94.711 kg)  08/07/14 227 lb 8 oz (103.193 kg)  12/15/13 199 lb (90.266 kg)     Past Medical History  Diagnosis Date  . Diabetes mellitus   . Hypertension   . Atrial fibrillation   . Hypertriglyceridemia   . Afib     Stopped amiodarone 2013-no further episodes of atrial fibrillation. Postoperative 2008  . H/O aortic valve replacement with tissue graft     2008.  . Back pain   . Cardiomyopathy     EF originally 20%-now 45%, no CAD on cath    Past Surgical History  Procedure Laterality Date  . Cardiac surgery  05/2007  Aortic Valve Replacement  . I&d extremity Right 05/05/2014    Procedure: IRRIGATION AND DEBRIDEMENT EXTREMITY;  Surgeon: [name], MD;  Location: [facility];  Service: Orthopedics;  Laterality: Right;  . Tee without cardioversion N/A 07/23/2014    Procedure: TRANSESOPHAGEAL ECHOCARDIOGRAM (TEE);  Surgeon: Josue Hector, MD;  Location: [facility] ENDOSCOPY;  Service: Cardiovascular;  Laterality: N/A;  .  Cardioversion N/A 07/23/2014    Procedure: CARDIOVERSION;  Surgeon: Josue Hector, MD;  Location: [facility] ENDOSCOPY;  Service: Cardiovascular;  Laterality: N/A;  . Amputation Right 07/29/2014    Procedure: AMPUTATION RIGHT LONG FINGER;  Surgeon: [name], MD;  Location: [facility];  Service: Orthopedics;  Laterality: Right;    Current Outpatient Prescriptions  Medication Sig Dispense Refill  . amiodarone (PACERONE) 200 MG tablet Take 1 tablet (200 mg total) by mouth 2 (two) times daily. 180 tablet 0  . amoxicillin (AMOXIL) 500 MG capsule Take 500 mg by mouth daily as needed. Before dental appointment    . apixaban (ELIQUIS) 5 MG TABS tablet Take 1 tablet (5 mg total) by mouth 2 (two) times daily. 60 tablet 0  . atorvastatin (LIPITOR) 10 MG tablet Take 1 tablet (10 mg total) by mouth every morning. 90 tablet 0  . carvedilol (COREG) 25 MG tablet Take 1 1/2 tablets twice daily 270 tablet 0  . furosemide (LASIX) 20 MG tablet TAKE TWO 20 MG TABLETS DAILY 180 tablet 0  . glipiZIDE (GLUCOTROL) 5 MG tablet Take 5 mg by mouth daily before breakfast.    . losartan (COZAAR) 50 MG tablet Take 0.5 tablets (25 mg total) by mouth daily. 45 tablet 0  . oxyCODONE (OXY IR/ROXICODONE) 5 MG immediate release tablet Take 1 tablet (5 mg total) by mouth every 6 (six) hours as needed for moderate pain. 30 tablet 0  . ALPRAZolam (XANAX) 0.25 MG tablet Take 1 tablet (0.25 mg total) by mouth 3 (three) times daily as needed for anxiety. (Patient not taking: Reported on 08/18/2014) 30 tablet 0  . insulin aspart (NOVOLOG) 100 UNIT/ML injection Inject 6 Units into the skin 3 (three) times daily with meals. (Patient not taking: Reported on 08/18/2014) 10 mL 11  . insulin glargine (LANTUS) 100 UNIT/ML injection Inject 0.25 mLs (25 Units total) into the skin at bedtime. (Patient not taking: Reported on 08/18/2014) 10 mL 11  . Insulin Pen Needle (RELION PEN NEEDLE 31G/8MM) 31G X 8 MM MISC Use with Lantus solostar flexpen to administer  insulin (Patient not taking: Reported on 08/18/2014) 100 each 0  . Insulin Syringe-Needle U-100 (RELION INSULIN SYR 0.3CC/30G) 30G X 5/16" 0.3 ML MISC Use with Novolog nsulin to administer meal coverage insulin (Patient not taking: Reported on 08/18/2014) 300 each 0  . polyethylene glycol (MIRALAX / GLYCOLAX) packet Take 17 g by mouth daily. (Patient not taking: Reported on 08/18/2014) 14 each 0   No current facility-administered medications for this visit.    Allergies:   No Known Allergies  Social History:  The patient  reports that he has never smoked. He has never used smokeless tobacco. He reports that he does not drink alcohol or use illicit drugs.   ROS:  Please see the history of present illness.   Denies any fevers, chills, orthopnea, PND    PHYSICAL EXAM: VS:  BP 118/64 mmHg  Pulse 96  Ht 6' (1.829 m)  Wt 208 lb 12.8 oz (94.711 kg)  BMI 28.31 kg/m2 Well nourished, well developed, in no acute distress HEENT: normal Neck: no JVD Cardiac:  normal S1, S2; irregularly irregular; no significant murmur Lungs:  clear to auscultation bilaterally, no wheezing, rhonchi or rales Abd: soft, nontender, no hepatomegaly Ext: no edema Skin: warm and dry, hand/finger currently dressed. Neuro: no focal abnormalities noted  EKG: 08/18/14-atrial flutter (difficult to see P waves but they are buried within) with variable conduction, heart rate 95 bpm. Left bundle branch block, QTC 510 ms. 12/15/13-sinus rhythm, LVH, prolonged QT 489 ms QTC.   ECHO 07/22/14 - EF 25-30%  (setting of recent tachycardia) ECHO 12/08/13: -  45% to 50%.  - Aortic valve: A bioprosthesis was present. Trivial regurgitation. - Aortic root: The aortic root was moderately dilated. - Mitral valve: Calcified annulus. - Left atrium: The atrium was moderately dilated. Impressions:  CTA 12/14/13 -  Mild dilatation of the proximal aorta at the sinuses of Valsalva, measuring 4.2 cm in estimated diameter. The rest of the  thoracic aorta is of normal caliber. No dissection is identified. Scattered calcified plaque is identified in the distribution of the LAD.   Nuclear stress test 11/2012-no ischemia, ejection fraction 42%  Labs: 08/06/14-sodium 135 (as low as 126 during hospitalization), 4.2 potassium, creatinine 0.63, hemoglobin 11, white count of 12.9, troponin normal, TSH 0.5   ASSESSMENT AND PLAN:  1. Hypotension-lisinopril, losartan, furosemide were held yesterday. Carvedilol was decreased to 6.25 mg twice a day, amiodarone continued. Gatorade. Feeling better. I will change his carvedilol to 12.5 mg twice a day. Continue to hold other medications. 2. Dilated aortic root. CT angiogram was reassuring. This in fact only showed 42 mm. Echocardiogram had been estimating 45-47 mm Sinus Valsalva. We will continue to monitor periodically with echocardiogram. Reassurance. Continue with aggressive pressure control.  3. Aortic valve replacement-doing well. No symptoms. Echocardiogram reassuring. Dental prophylaxis. 4. Hypertension-under good control, in fact recent hypotension. 5. Cardiomyopathy-in part tachycardia induced with most recent ejection fraction 25-30%, previously this year 45-50% EF. During aortic valve replacement, EF was 20%. 6. Atrial flutter- amiodarone 200 mg twice a day. Attempted TEE cardioversion 07/23/14-left atrial appendage thrombus. Currently rate controlled, heart rate in the 90s. We will go ahead and schedule him for TEE cardioversion on December 16. For a period of time he was off of amiodarone. Back on amiodarone after recent hospitalization November 2015 with atrial flutter with rapid ventricular response. 7. Diabetes-working diligently on this. Stress the importance of this. Hemoglobin A1c greater than 11. 8. One month.  Signed, [name], MD [facility]  08/18/2014 8:31 AM

## 2014-09-01 NOTE — Discharge Instructions (Signed)

## 2014-09-01 NOTE — Anesthesia Postprocedure Evaluation (Signed)
  Anesthesia Post-op Note  Patient: Thomas Wright  Procedure(s) Performed: Procedure(s): TRANSESOPHAGEAL ECHOCARDIOGRAM (TEE) (N/A) CARDIOVERSION (N/A)  Patient Location: Endoscopy Unit  Anesthesia Type:General  Level of Consciousness: awake, alert  and oriented  Airway and Oxygen Therapy: Patient Spontanous Breathing and Patient connected to nasal cannula oxygen  Post-op Pain: none  Post-op Assessment: Post-op Vital signs reviewed, Patient's Cardiovascular Status Stable, Respiratory Function Stable, Patent Airway, No signs of Nausea or vomiting and Pain level controlled  Post-op Vital Signs: stable  Last Vitals:  Filed Vitals:   09/01/14 1305  BP: 101/40  Pulse: 67  Temp:   Resp: 25    Complications: No apparent anesthesia complications

## 2014-09-01 NOTE — Transfer of Care (Signed)
Immediate Anesthesia Transfer of Care Note  Patient: Thomas Wright  Procedure(s) Performed: Procedure(s): TRANSESOPHAGEAL ECHOCARDIOGRAM (TEE) (N/A) CARDIOVERSION (N/A)  Patient Location: PACU  Anesthesia Type:MAC  Level of Consciousness: awake and alert   Airway & Oxygen Therapy: Patient Spontanous Breathing and Patient connected to nasal cannula oxygen  Post-op Assessment: Report given to PACU RN and Post -op Vital signs reviewed and stable  Post vital signs: Reviewed and stable  Complications: No apparent anesthesia complications

## 2014-09-01 NOTE — Progress Notes (Signed)
*  PRELIMINARY RESULTS* Echocardiogram Echocardiogram Transesophageal has been performed.  Leavy Cella 09/01/2014, 12:47 PM

## 2014-09-01 NOTE — Anesthesia Preprocedure Evaluation (Signed)
Anesthesia Evaluation  Patient identified by MRN, date of birth, ID band Patient awake    Reviewed: Allergy & Precautions, H&P , NPO status , Patient's Chart, lab work & pertinent test results  History of Anesthesia Complications Negative for: history of anesthetic complications  Airway Mallampati: II  TM Distance: >3 FB Neck ROM: Full    Dental  (+) Teeth Intact   Pulmonary neg pulmonary ROS, neg shortness of breath,  breath sounds clear to auscultation        Cardiovascular hypertension, Pt. on medications and Pt. on home beta blockers - angina+CHF - Past MI and - DOE + dysrhythmias Atrial Fibrillation Rhythm:Irregular  EF 25% s/p AVR   Neuro/Psych negative neurological ROS  negative psych ROS   GI/Hepatic negative GI ROS, Neg liver ROS,   Endo/Other  diabetes, Type 2  Renal/GU negative Renal ROS     Musculoskeletal   Abdominal   Peds  Hematology negative hematology ROS (+)   Anesthesia Other Findings   Reproductive/Obstetrics                             Anesthesia Physical Anesthesia Plan  ASA: III  Anesthesia Plan: General   Post-op Pain Management:    Induction: Intravenous  Airway Management Planned: Simple Face Mask and Natural Airway  Additional Equipment: None  Intra-op Plan:   Post-operative Plan:   Informed Consent: I have reviewed the patients History and Physical, chart, labs and discussed the procedure including the risks, benefits and alternatives for the proposed anesthesia with the patient or authorized representative who has indicated his/her understanding and acceptance.   Dental advisory given  Plan Discussed with: Surgeon and CRNA  Anesthesia Plan Comments:         Anesthesia Quick Evaluation

## 2014-09-02 ENCOUNTER — Encounter (HOSPITAL_COMMUNITY): Payer: Self-pay | Admitting: Cardiology

## 2014-09-03 ENCOUNTER — Telehealth: Payer: Self-pay | Admitting: Cardiology

## 2014-09-03 NOTE — Telephone Encounter (Signed)
Pt aware to continue on Eliquis.

## 2014-09-03 NOTE — Telephone Encounter (Signed)
PT HAD PROCEDURE DONE AND NO BLOOD CLOT WAS SEEN--DOES HE STILL NEED TO BE ON ELIQUIS

## 2014-09-13 ENCOUNTER — Ambulatory Visit (INDEPENDENT_AMBULATORY_CARE_PROVIDER_SITE_OTHER): Payer: Commercial Managed Care - HMO | Admitting: Cardiology

## 2014-09-13 ENCOUNTER — Encounter: Payer: Self-pay | Admitting: Cardiology

## 2014-09-13 VITALS — BP 126/72 | HR 63 | Ht 72.0 in | Wt 214.8 lb

## 2014-09-13 DIAGNOSIS — Z954 Presence of other heart-valve replacement: Secondary | ICD-10-CM

## 2014-09-13 DIAGNOSIS — I428 Other cardiomyopathies: Secondary | ICD-10-CM | POA: Insufficient documentation

## 2014-09-13 DIAGNOSIS — I10 Essential (primary) hypertension: Secondary | ICD-10-CM

## 2014-09-13 DIAGNOSIS — I429 Cardiomyopathy, unspecified: Secondary | ICD-10-CM

## 2014-09-13 DIAGNOSIS — Z7901 Long term (current) use of anticoagulants: Secondary | ICD-10-CM

## 2014-09-13 DIAGNOSIS — I480 Paroxysmal atrial fibrillation: Secondary | ICD-10-CM

## 2014-09-13 DIAGNOSIS — E119 Type 2 diabetes mellitus without complications: Secondary | ICD-10-CM | POA: Insufficient documentation

## 2014-09-13 MED ORDER — AMIODARONE HCL 200 MG PO TABS: 200.0000 mg | ORAL_TABLET | Freq: Every day | ORAL | Status: DC

## 2014-09-13 NOTE — Assessment & Plan Note (Signed)
Controlled.  

## 2014-09-13 NOTE — Patient Instructions (Signed)
Your physician has recommended you make the following change in your medication:  1) DECREASE Amiodarone to 200mg  daily  Your physician has requested that you have an echocardiogram. Echocardiography is a painless test that uses sound waves to create images of your heart. It provides your doctor with information about the size and shape of your heart and how well your heart's chambers and valves are working. This procedure takes approximately one hour. There are no restrictions for this procedure.(TO BE SCHEDULED EARLY MARCH 2016)   Your physician recommends that you schedule a follow-up appointment with [name] after echo

## 2014-09-13 NOTE — Assessment & Plan Note (Signed)
Surgery 2008

## 2014-09-13 NOTE — Assessment & Plan Note (Addendum)
Holding NSR. S/P TEE/DCCV 09/01/14.

## 2014-09-13 NOTE — Assessment & Plan Note (Signed)
On Eliquis

## 2014-09-13 NOTE — Assessment & Plan Note (Signed)
Type 2 IDDM 

## 2014-09-13 NOTE — Assessment & Plan Note (Signed)
EF 30-35% by TEE 09/01/14,(Normal coronaries 2008) - presumably secondary to AF

## 2014-09-13 NOTE — Progress Notes (Signed)
09/13/2014 Thomas Wright   Dec 28, 1945  [id number]  Primary Physician Thomas Heck, MD Primary Cardiologist: Dr Thomas Wright  HPI:  68 year old male, works at [employer], with previous PAF after AVR 2008. Amiodarone was stopped in 2013 after no further episodes of PAF. He presented 07/21/14 with diabetic ketoacidosis, AF with RVR, and cardiomyopathy with an EF of 25% and CHF. He was to get a cardioversion then but his TEE was positive for LAA clot. He was placed on Eliquis and Amiodarone. Other problems included a rectal abscess and a chronically infected finger followed by Dr Thomas Wright. He was ultimately discharged 08/07/14 and admitted as an OP for TEE CV 09/01/14. He has done well since his cardioversion. He is back to work, he thinks he only has to work one more year then he can retire.    Current Outpatient Prescriptions  Medication Sig Dispense Refill  . amiodarone (PACERONE) 200 MG tablet Take 1 tablet (200 mg total) by mouth daily.    Marland Kitchen amoxicillin (AMOXIL) 500 MG capsule Take 500 mg by mouth daily as needed. Before dental appointment    . apixaban (ELIQUIS) 5 MG TABS tablet Take 1 tablet (5 mg total) by mouth 2 (two) times daily. 60 tablet 0  . aspirin EC 81 MG tablet Take 81 mg by mouth daily.    . ASSURE COMFORT LANCETS 30G MISC Take 30 g by mouth daily.    . carvedilol (COREG) 25 MG tablet Take 0.5 tablets (12.5 mg total) by mouth 2 (two) times daily with a meal. Take 1/2 tablet twice daily    . EMBRACE BLOOD GLUCOSE TEST test strip     . furosemide (LASIX) 20 MG tablet Take 20 mg by mouth daily.    Marland Kitchen glipiZIDE (GLUCOTROL) 5 MG tablet Take 5 mg by mouth daily before breakfast.    . metFORMIN (GLUCOPHAGE) 1000 MG tablet Take 1,000 mg by mouth 2 (two) times daily with a meal.    . polyethylene glycol (MIRALAX / GLYCOLAX) packet Take 17 g by mouth daily. 14 each 0  . ALPRAZolam (XANAX) 0.25 MG tablet Take 1 tablet (0.25 mg total) by mouth 3 (three) times daily as needed for  anxiety. (Patient not taking: Reported on 08/27/2014) 30 tablet 0  . atorvastatin (LIPITOR) 10 MG tablet Take 1 tablet (10 mg total) by mouth every morning. (Patient not taking: Reported on 09/13/2014) 90 tablet 0  . insulin aspart (NOVOLOG) 100 UNIT/ML injection Inject 6 Units into the skin 3 (three) times daily with meals. (Patient not taking: Reported on 08/18/2014) 10 mL 11  . insulin glargine (LANTUS) 100 UNIT/ML injection Inject 0.25 mLs (25 Units total) into the skin at bedtime. (Patient not taking: Reported on 08/18/2014) 10 mL 11  . Insulin Pen Needle (RELION PEN NEEDLE 31G/8MM) 31G X 8 MM MISC Use with Lantus solostar flexpen to administer insulin (Patient not taking: Reported on 08/18/2014) 100 each 0  . Insulin Syringe-Needle U-100 (RELION INSULIN SYR 0.3CC/30G) 30G X 5/16" 0.3 ML MISC Use with Novolog nsulin to administer meal coverage insulin (Patient not taking: Reported on 08/18/2014) 300 each 0  . oxyCODONE (OXY IR/ROXICODONE) 5 MG immediate release tablet Take 1 tablet (5 mg total) by mouth every 6 (six) hours as needed for moderate pain. (Patient not taking: Reported on 09/13/2014) 30 tablet 0   No current facility-administered medications for this visit.    No Known Allergies  History   Social History  . Marital Status: Legally Separated  Spouse Name: N/A    Number of Children: N/A  . Years of Education: N/A   Occupational History  . Not on file.   Social History Main Topics  . Smoking status: Never Smoker   . Smokeless tobacco: Never Used  . Alcohol Use: No  . Drug Use: No  . Sexual Activity: Not on file   Other Topics Concern  . Not on file   Social History Narrative     Review of Systems: General: negative for chills, fever, night sweats or weight changes.  Cardiovascular: negative for chest pain, dyspnea on exertion, edema, orthopnea, palpitations, paroxysmal nocturnal dyspnea or shortness of breath Dermatological: negative for rash Respiratory: negative  for cough or wheezing Urologic: negative for hematuria Abdominal: negative for nausea, vomiting, diarrhea, bright red blood per rectum, melena, or hematemesis Neurologic: negative for visual changes, syncope, or dizziness All other systems reviewed and are otherwise negative except as noted above.    Blood pressure 126/72, pulse 63, height 6' (1.829 m), weight 214 lb 12.8 oz (97.433 kg).  General appearance: alert, cooperative, no distress and mildly obese Lungs: clear to auscultation bilaterally Heart: regular rate and rhythm  EKG NSR, LAD, LBBB  ASSESSMENT AND PLAN:   Paroxysmal atrial fibrillation Holding NSR. S/P TEE/DCCV 09/01/14.  Cardiomyopathy, nonischemic EF 30-35% by TEE 09/01/14,(Normal coronaries 2008) - presumably secondary to AF  H/O aortic valve replacement with tissue graft Surgery 2008  Hypertension Controlled  Diabetes Type 2 IDDM  Chronic anticoagulation On Eliquis   PLAN  I cut Mr Thomas Wright Amiodarone to 200 mg daily. He asked if he had to continue Eliquis and strongly suggested her do this. He'll be set up for an Echo in March and a follow up with Dr Thomas Wright.   Minor Thomas Wright KPA-C 09/13/2014 3:15 PM

## 2014-09-15 ENCOUNTER — Ambulatory Visit: Payer: Commercial Managed Care - HMO | Admitting: Cardiology

## 2014-09-16 ENCOUNTER — Encounter (HOSPITAL_COMMUNITY): Payer: Self-pay | Admitting: Orthopedic Surgery

## 2014-09-16 NOTE — Addendum Note (Signed)
Addendum  created 09/16/14 0950 by Josephine Igo, CRNA   Modules edited: Anesthesia Events

## 2014-09-30 ENCOUNTER — Encounter (HOSPITAL_COMMUNITY): Payer: Self-pay | Admitting: Orthopedic Surgery

## 2014-11-15 DIAGNOSIS — R8290 Unspecified abnormal findings in urine: Secondary | ICD-10-CM | POA: Diagnosis not present

## 2014-11-15 DIAGNOSIS — E1165 Type 2 diabetes mellitus with hyperglycemia: Secondary | ICD-10-CM | POA: Diagnosis not present

## 2014-11-15 DIAGNOSIS — E878 Other disorders of electrolyte and fluid balance, not elsewhere classified: Secondary | ICD-10-CM | POA: Diagnosis not present

## 2014-11-15 DIAGNOSIS — N390 Urinary tract infection, site not specified: Secondary | ICD-10-CM | POA: Diagnosis not present

## 2014-11-17 DIAGNOSIS — E1165 Type 2 diabetes mellitus with hyperglycemia: Secondary | ICD-10-CM | POA: Diagnosis not present

## 2014-11-17 DIAGNOSIS — L97509 Non-pressure chronic ulcer of other part of unspecified foot with unspecified severity: Secondary | ICD-10-CM | POA: Diagnosis not present

## 2014-11-17 DIAGNOSIS — L03115 Cellulitis of right lower limb: Secondary | ICD-10-CM | POA: Diagnosis not present

## 2014-11-22 ENCOUNTER — Ambulatory Visit (HOSPITAL_BASED_OUTPATIENT_CLINIC_OR_DEPARTMENT_OTHER): Payer: Commercial Managed Care - HMO | Admitting: Cardiology

## 2014-11-22 DIAGNOSIS — Z952 Presence of prosthetic heart valve: Secondary | ICD-10-CM | POA: Diagnosis not present

## 2014-11-22 DIAGNOSIS — Z7982 Long term (current) use of aspirin: Secondary | ICD-10-CM | POA: Diagnosis not present

## 2014-11-22 DIAGNOSIS — E1142 Type 2 diabetes mellitus with diabetic polyneuropathy: Secondary | ICD-10-CM | POA: Diagnosis not present

## 2014-11-22 DIAGNOSIS — I428 Other cardiomyopathies: Secondary | ICD-10-CM

## 2014-11-22 DIAGNOSIS — I482 Chronic atrial fibrillation: Secondary | ICD-10-CM | POA: Diagnosis not present

## 2014-11-22 DIAGNOSIS — E1159 Type 2 diabetes mellitus with other circulatory complications: Secondary | ICD-10-CM | POA: Diagnosis not present

## 2014-11-22 DIAGNOSIS — Z794 Long term (current) use of insulin: Secondary | ICD-10-CM | POA: Diagnosis not present

## 2014-11-22 DIAGNOSIS — I429 Cardiomyopathy, unspecified: Secondary | ICD-10-CM | POA: Insufficient documentation

## 2014-11-22 DIAGNOSIS — E871 Hypo-osmolality and hyponatremia: Secondary | ICD-10-CM | POA: Diagnosis not present

## 2014-11-22 DIAGNOSIS — M79671 Pain in right foot: Secondary | ICD-10-CM | POA: Diagnosis present

## 2014-11-22 DIAGNOSIS — E1151 Type 2 diabetes mellitus with diabetic peripheral angiopathy without gangrene: Secondary | ICD-10-CM | POA: Diagnosis not present

## 2014-11-22 DIAGNOSIS — I10 Essential (primary) hypertension: Secondary | ICD-10-CM | POA: Diagnosis not present

## 2014-11-22 DIAGNOSIS — E781 Pure hyperglyceridemia: Secondary | ICD-10-CM | POA: Diagnosis present

## 2014-11-22 DIAGNOSIS — L03115 Cellulitis of right lower limb: Secondary | ICD-10-CM | POA: Diagnosis not present

## 2014-11-22 DIAGNOSIS — I4891 Unspecified atrial fibrillation: Secondary | ICD-10-CM | POA: Diagnosis not present

## 2014-11-22 DIAGNOSIS — E11621 Type 2 diabetes mellitus with foot ulcer: Secondary | ICD-10-CM | POA: Diagnosis not present

## 2014-11-22 DIAGNOSIS — M549 Dorsalgia, unspecified: Secondary | ICD-10-CM | POA: Diagnosis not present

## 2014-11-22 DIAGNOSIS — L97519 Non-pressure chronic ulcer of other part of right foot with unspecified severity: Secondary | ICD-10-CM | POA: Diagnosis not present

## 2014-11-22 DIAGNOSIS — E1165 Type 2 diabetes mellitus with hyperglycemia: Secondary | ICD-10-CM | POA: Diagnosis not present

## 2014-11-22 NOTE — Progress Notes (Signed)
Echo performed. 

## 2014-11-23 ENCOUNTER — Encounter (HOSPITAL_COMMUNITY): Payer: Self-pay | Admitting: Emergency Medicine

## 2014-11-23 ENCOUNTER — Emergency Department (HOSPITAL_COMMUNITY): Payer: Commercial Managed Care - HMO

## 2014-11-23 ENCOUNTER — Inpatient Hospital Stay (HOSPITAL_COMMUNITY)
Admission: EM | Admit: 2014-11-23 | Discharge: 2014-11-25 | DRG: 638 | Disposition: A | Discharge status: Home / Self Care | Payer: Commercial Managed Care - HMO | Attending: Internal Medicine | Admitting: Internal Medicine

## 2014-11-23 DIAGNOSIS — L03119 Cellulitis of unspecified part of limb: Secondary | ICD-10-CM

## 2014-11-23 DIAGNOSIS — L97519 Non-pressure chronic ulcer of other part of right foot with unspecified severity: Secondary | ICD-10-CM

## 2014-11-23 DIAGNOSIS — L03115 Cellulitis of right lower limb: Secondary | ICD-10-CM | POA: Diagnosis present

## 2014-11-23 DIAGNOSIS — Z7982 Long term (current) use of aspirin: Secondary | ICD-10-CM | POA: Diagnosis not present

## 2014-11-23 DIAGNOSIS — R739 Hyperglycemia, unspecified: Secondary | ICD-10-CM

## 2014-11-23 DIAGNOSIS — Z794 Long term (current) use of insulin: Secondary | ICD-10-CM | POA: Diagnosis not present

## 2014-11-23 DIAGNOSIS — I429 Cardiomyopathy, unspecified: Secondary | ICD-10-CM | POA: Diagnosis present

## 2014-11-23 DIAGNOSIS — I10 Essential (primary) hypertension: Secondary | ICD-10-CM | POA: Diagnosis present

## 2014-11-23 DIAGNOSIS — E1165 Type 2 diabetes mellitus with hyperglycemia: Secondary | ICD-10-CM | POA: Diagnosis present

## 2014-11-23 DIAGNOSIS — L97509 Non-pressure chronic ulcer of other part of unspecified foot with unspecified severity: Secondary | ICD-10-CM

## 2014-11-23 DIAGNOSIS — I4891 Unspecified atrial fibrillation: Secondary | ICD-10-CM | POA: Diagnosis present

## 2014-11-23 DIAGNOSIS — E1142 Type 2 diabetes mellitus with diabetic polyneuropathy: Secondary | ICD-10-CM

## 2014-11-23 DIAGNOSIS — E1151 Type 2 diabetes mellitus with diabetic peripheral angiopathy without gangrene: Secondary | ICD-10-CM | POA: Diagnosis present

## 2014-11-23 DIAGNOSIS — Z952 Presence of prosthetic heart valve: Secondary | ICD-10-CM | POA: Diagnosis not present

## 2014-11-23 DIAGNOSIS — E871 Hypo-osmolality and hyponatremia: Secondary | ICD-10-CM | POA: Diagnosis present

## 2014-11-23 DIAGNOSIS — E11621 Type 2 diabetes mellitus with foot ulcer: Principal | ICD-10-CM | POA: Diagnosis present

## 2014-11-23 DIAGNOSIS — M79671 Pain in right foot: Secondary | ICD-10-CM | POA: Diagnosis present

## 2014-11-23 DIAGNOSIS — M549 Dorsalgia, unspecified: Secondary | ICD-10-CM | POA: Diagnosis present

## 2014-11-23 DIAGNOSIS — E119 Type 2 diabetes mellitus without complications: Secondary | ICD-10-CM

## 2014-11-23 DIAGNOSIS — E781 Pure hyperglyceridemia: Secondary | ICD-10-CM | POA: Diagnosis present

## 2014-11-23 LAB — CBC WITH DIFFERENTIAL/PLATELET
BASOS ABS: 0 10*3/uL (ref 0.0–0.1)
Basophils Relative: 0 % (ref 0–1)
EOS ABS: 0.4 10*3/uL (ref 0.0–0.7)
EOS PCT: 4 % (ref 0–5)
HCT: 41.3 % (ref 39.0–52.0)
Hemoglobin: 14.2 g/dL (ref 13.0–17.0)
LYMPHS ABS: 3.1 10*3/uL (ref 0.7–4.0)
LYMPHS PCT: 31 % (ref 12–46)
MCH: 27.6 pg (ref 26.0–34.0)
MCHC: 34.4 g/dL (ref 30.0–36.0)
MCV: 80.4 fL (ref 78.0–100.0)
Monocytes Absolute: 0.7 10*3/uL (ref 0.1–1.0)
Monocytes Relative: 7 % (ref 3–12)
Neutro Abs: 5.7 10*3/uL (ref 1.7–7.7)
Neutrophils Relative %: 58 % (ref 43–77)
Platelets: 303 10*3/uL (ref 150–400)
RBC: 5.14 MIL/uL (ref 4.22–5.81)
RDW: 13.8 % (ref 11.5–15.5)
WBC: 9.9 10*3/uL (ref 4.0–10.5)

## 2014-11-23 LAB — I-STAT CHEM 8, ED
BUN: 19 mg/dL (ref 6–23)
CALCIUM ION: 1.14 mmol/L (ref 1.13–1.30)
Chloride: 96 mmol/L (ref 96–112)
Creatinine, Ser: 0.8 mg/dL (ref 0.50–1.35)
Glucose, Bld: 453 mg/dL — ABNORMAL HIGH (ref 70–99)
HEMATOCRIT: 46 % (ref 39.0–52.0)
HEMOGLOBIN: 15.6 g/dL (ref 13.0–17.0)
Potassium: 4.4 mmol/L (ref 3.5–5.1)
SODIUM: 133 mmol/L — AB (ref 135–145)
TCO2: 24 mmol/L (ref 0–100)

## 2014-11-23 LAB — CBG MONITORING, ED
GLUCOSE-CAPILLARY: 190 mg/dL — AB (ref 70–99)
Glucose-Capillary: 292 mg/dL — ABNORMAL HIGH (ref 70–99)

## 2014-11-23 MED ORDER — PIPERACILLIN-TAZOBACTAM 3.375 G IVPB
3.3750 g | Freq: Three times a day (TID) | INTRAVENOUS | Status: DC
  Filled 2014-11-23: qty 50

## 2014-11-23 MED ORDER — MORPHINE SULFATE 2 MG/ML IJ SOLN: 1.0000 mg | INTRAMUSCULAR | Status: DC | PRN

## 2014-11-23 MED ORDER — VITAMIN B-1 100 MG PO TABS
100.0000 mg | ORAL_TABLET | Freq: Every day | ORAL | Status: DC
  Administered 2014-11-24 – 2014-11-25 (×2): 100 mg via ORAL
  Filled 2014-11-23 (×2): qty 1

## 2014-11-23 MED ORDER — PIPERACILLIN-TAZOBACTAM 3.375 G IVPB 30 MIN
3.3750 g | Freq: Once | INTRAVENOUS | Status: AC
  Administered 2014-11-23: 3.375 g via INTRAVENOUS
  Filled 2014-11-23: qty 50

## 2014-11-23 MED ORDER — CARVEDILOL 12.5 MG PO TABS
12.5000 mg | ORAL_TABLET | Freq: Two times a day (BID) | ORAL | Status: DC
  Administered 2014-11-24 – 2014-11-25 (×3): 12.5 mg via ORAL
  Filled 2014-11-23 (×5): qty 1

## 2014-11-23 MED ORDER — SODIUM CHLORIDE 0.9 % IV BOLUS (SEPSIS)
1000.0000 mL | Freq: Once | INTRAVENOUS | Status: AC
  Administered 2014-11-23: 1000 mL via INTRAVENOUS

## 2014-11-23 MED ORDER — POLYETHYLENE GLYCOL 3350 17 G PO PACK
17.0000 g | PACK | Freq: Every day | ORAL | Status: DC
  Filled 2014-11-23 (×2): qty 1

## 2014-11-23 MED ORDER — ONDANSETRON HCL 4 MG PO TABS: 4.0000 mg | ORAL_TABLET | Freq: Four times a day (QID) | ORAL | Status: DC | PRN

## 2014-11-23 MED ORDER — SODIUM CHLORIDE 0.9 % IV SOLN
INTRAVENOUS | Status: DC
  Administered 2014-11-23: 1000 mL via INTRAVENOUS

## 2014-11-23 MED ORDER — AMIODARONE HCL 200 MG PO TABS
200.0000 mg | ORAL_TABLET | Freq: Every day | ORAL | Status: DC
  Administered 2014-11-24 – 2014-11-25 (×2): 200 mg via ORAL
  Filled 2014-11-23 (×2): qty 1

## 2014-11-23 MED ORDER — ASPIRIN EC 81 MG PO TBEC
81.0000 mg | DELAYED_RELEASE_TABLET | Freq: Every day | ORAL | Status: DC
Start: 2014-11-24 — End: 2014-11-25
  Administered 2014-11-24 – 2014-11-25 (×2): 81 mg via ORAL
  Filled 2014-11-23 (×2): qty 1

## 2014-11-23 MED ORDER — FOLIC ACID 1 MG PO TABS
1.0000 mg | ORAL_TABLET | Freq: Every day | ORAL | Status: DC
Start: 2014-11-24 — End: 2014-11-25
  Administered 2014-11-24 – 2014-11-25 (×2): 1 mg via ORAL
  Filled 2014-11-23 (×2): qty 1

## 2014-11-23 MED ORDER — ONDANSETRON HCL 4 MG/2ML IJ SOLN
4.0000 mg | Freq: Four times a day (QID) | INTRAMUSCULAR | Status: DC | PRN
Start: 2014-11-23 — End: 2014-11-25

## 2014-11-23 MED ORDER — VANCOMYCIN HCL 10 G IV SOLR
1250.0000 mg | Freq: Two times a day (BID) | INTRAVENOUS | Status: DC
  Administered 2014-11-23: 1250 mg via INTRAVENOUS
  Filled 2014-11-23: qty 1250

## 2014-11-23 MED ORDER — GLIPIZIDE 5 MG PO TABS
5.0000 mg | ORAL_TABLET | Freq: Every day | ORAL | Status: DC
  Administered 2014-11-24 – 2014-11-25 (×2): 5 mg via ORAL
  Filled 2014-11-23 (×3): qty 1

## 2014-11-23 MED ORDER — METFORMIN HCL 500 MG PO TABS
1000.0000 mg | ORAL_TABLET | Freq: Two times a day (BID) | ORAL | Status: DC
  Administered 2014-11-24 – 2014-11-25 (×3): 1000 mg via ORAL
  Filled 2014-11-23 (×5): qty 2

## 2014-11-23 MED ORDER — INSULIN ASPART 100 UNIT/ML ~~LOC~~ SOLN
0.0000 [IU] | SUBCUTANEOUS | Status: DC
  Administered 2014-11-23 – 2014-11-24 (×2): 3 [IU] via SUBCUTANEOUS
  Administered 2014-11-24: 8 [IU] via SUBCUTANEOUS
  Administered 2014-11-24: 3 [IU] via SUBCUTANEOUS
  Administered 2014-11-24: 5 [IU] via SUBCUTANEOUS
  Administered 2014-11-24: 11 [IU] via SUBCUTANEOUS
  Administered 2014-11-25: 3 [IU] via SUBCUTANEOUS
  Administered 2014-11-25: 2 [IU] via SUBCUTANEOUS
  Administered 2014-11-25: 3 [IU] via SUBCUTANEOUS

## 2014-11-23 MED ORDER — ADULT MULTIVITAMIN W/MINERALS CH
1.0000 | ORAL_TABLET | Freq: Every day | ORAL | Status: DC
Start: 2014-11-24 — End: 2014-11-25
  Administered 2014-11-24 – 2014-11-25 (×2): 1 via ORAL
  Filled 2014-11-23 (×2): qty 1

## 2014-11-23 MED ORDER — INSULIN ASPART 100 UNIT/ML ~~LOC~~ SOLN
10.0000 [IU] | Freq: Once | SUBCUTANEOUS | Status: AC
  Administered 2014-11-23: 10 [IU] via INTRAVENOUS
  Filled 2014-11-23: qty 1

## 2014-11-23 MED ORDER — HEPARIN SODIUM (PORCINE) 5000 UNIT/ML IJ SOLN
5000.0000 [IU] | Freq: Three times a day (TID) | INTRAMUSCULAR | Status: DC
  Administered 2014-11-23 – 2014-11-25 (×5): 5000 [IU] via SUBCUTANEOUS
  Filled 2014-11-23 (×8): qty 1

## 2014-11-23 NOTE — Progress Notes (Signed)
ANTIBIOTIC CONSULT NOTE - INITIAL  Pharmacy Consult for vancomycin + zosyn Indication: cellulitis  No Known Allergies  Patient Measurements:   Adjusted Body Weight:   Vital Signs: Temp: 98.9 F (37.2 C) (03/08 1721) Temp Source: Oral (03/08 1721) BP: 164/74 mmHg (03/08 1721) Pulse Rate: 77 (03/08 1721) Intake/Output from previous day:   Intake/Output from this shift:    Labs:  Recent Labs  11/23/14 1730 11/23/14 1744  WBC 9.9  --   HGB 14.2 15.6  PLT 303  --   CREATININE  --  0.80   CrCl cannot be calculated (Unknown ideal weight.). No results for input(s): VANCOTROUGH, VANCOPEAK, VANCORANDOM, GENTTROUGH, GENTPEAK, GENTRANDOM, TOBRATROUGH, TOBRAPEAK, TOBRARND, AMIKACINPEAK, AMIKACINTROU, AMIKACIN in the last 72 hours.   Microbiology: No results found for this or any previous visit (from the past 720 hour(s)).  Medical History: Past Medical History  Diagnosis Date  . Diabetes mellitus   . Hypertension   . Atrial fibrillation   . Hypertriglyceridemia   . Afib     Stopped amiodarone 2013-no further episodes of atrial fibrillation. Postoperative 2008  . H/O aortic valve replacement with tissue graft     2008.  . Back pain   . Cardiomyopathy     EF originally 20%-now 45%, no CAD on cath    Medications:  Anti-infectives    Start     Dose/Rate Route Frequency Ordered Stop   11/24/14 0300  piperacillin-tazobactam (ZOSYN) IVPB 3.375 g     3.375 g 12.5 mL/hr over 240 Minutes Intravenous Every 8 hours 11/23/14 2005     11/23/14 2030  vancomycin (VANCOCIN) 1,250 mg in sodium chloride 0.9 % 250 mL IVPB     1,250 mg 166.7 mL/hr over 90 Minutes Intravenous Every 12 hours 11/23/14 2002     11/23/14 2015  piperacillin-tazobactam (ZOSYN) IVPB 3.375 g     3.375 g 100 mL/hr over 30 Minutes Intravenous  Once 11/23/14 2001       Assessment: 3 yom presented to the ED with a worsening foot wound. To start vancomycin and zosyn for treatment of cellulitis. Pt is afebrile  and WBC is WNL. Scr is WNL at 0.8.   Vanc 3/8>> Zosyn 3/8>>  Goal of Therapy:  Vancomycin trough level 10-15 mcg/ml  Plan:  1. Vancomycin 1250mg  IV Q12H 2. Zosyn 3.375gm IV Q8H (4 hr inf) 3. F/u renal fxn, C&S, clinical status and trough at [facility], Rande Lawman 11/23/2014,8:05 PM

## 2014-11-23 NOTE — ED Notes (Signed)
Attempted Report 

## 2014-11-23 NOTE — ED Provider Notes (Signed)
CSN: [id number]     Arrival date & time 11/23/14  1710 History   First MD Initiated Contact with Patient 11/23/14 1921     Chief Complaint  Patient presents with  . Foot Pain     (Consider location/radiation/quality/duration/timing/severity/associated sxs/prior Treatment) The history is provided by the patient and medical records.    This is a 69 y.o. M with PMH significant for HTN, AFIB, DM, hyperlipidemia, h/o aortic valve replacement, cardiomyopathy, presenting to the ED for diabetic ulcer of right foot and leg.  Patient states wounds have been present for the past 2 weeks but have been progressively getting large in size.  Patient saw his PCP for this initially and had wound culture performed which revealed staph aureus which was susceptible to augmentin which he has been taking as directed.  States areas have become larger in size with increased redness as well as foul smelling drainage.  He denies fever, chills, sweats at home.  He states his peripheral neuropathy is at baseline. He does admit his blood sugars have been higher than normal recently. He states he has been working long hours at work and has not kept up with his diabetic regimen as he normally does. Patient is scheduled to see a podiatrist later this month, but during evaluation with his PCP today he was sent here for more urgent evaluation.  Past Medical History  Diagnosis Date  . Diabetes mellitus   . Hypertension   . Atrial fibrillation   . Hypertriglyceridemia   . Afib     Stopped amiodarone 2013-no further episodes of atrial fibrillation. Postoperative 2008  . H/O aortic valve replacement with tissue graft     2008.  . Back pain   . Cardiomyopathy     EF originally 20%-now 45%, no CAD on cath   Past Surgical History  Procedure Laterality Date  . Cardiac surgery  05/2007    Aortic Valve Replacement  . I&d extremity Right 05/05/2014    Procedure: IRRIGATION AND DEBRIDEMENT EXTREMITY;  Surgeon: [name], MD;   Location: [place];  Service: Orthopedics;  Laterality: Right;  . Tee without cardioversion N/A 07/23/2014    Procedure: TRANSESOPHAGEAL ECHOCARDIOGRAM (TEE);  Surgeon: Josue Hector, MD;  Location: [facility] ENDOSCOPY;  Service: Cardiovascular;  Laterality: N/A;  . Cardioversion N/A 07/23/2014    Procedure: CARDIOVERSION;  Surgeon: Josue Hector, MD;  Location: [facility] ENDOSCOPY;  Service: Cardiovascular;  Laterality: N/A;  . Tee without cardioversion N/A 09/01/2014    Procedure: TRANSESOPHAGEAL ECHOCARDIOGRAM (TEE);  Surgeon: [name], MD;  Location: [facility] ENDOSCOPY;  Service: Cardiovascular;  Laterality: N/A;  . Cardioversion N/A 09/01/2014    Procedure: CARDIOVERSION;  Surgeon: [name], MD;  Location: [facility] ENDOSCOPY;  Service: Cardiovascular;  Laterality: N/A;  . Amputation Right 07/29/2014    Procedure: AMPUTATION RIGHT LONG FINGER;  Surgeon: [name], MD;  Location: [place];  Service: Orthopedics;  Laterality: Right;   Family History  Problem Relation Age of Onset  . Colon cancer Father   . Liver disease Brother   . Heart murmur Child   . Congenital heart disease Other    History  Substance Use Topics  . Smoking status: Never Smoker   . Smokeless tobacco: Never Used  . Alcohol Use: No    Review of Systems  Skin: Positive for wound.  All other systems reviewed and are negative.     Allergies  Review of patient's allergies indicates no known allergies.  Home Medications   Prior to Admission medications  Medication Sig Start Date End Date Taking? Authorizing Provider  amiodarone (PACERONE) 200 MG tablet Take 1 tablet (200 mg total) by mouth daily. 09/13/14  Yes Luke K Kilroy, PA-C  amoxicillin (AMOXIL) 500 MG capsule Take 500 mg by mouth daily as needed. Before dental appointment 08/17/14  Yes Historical Provider, MD  aspirin EC 81 MG tablet Take 81 mg by mouth daily.   Yes Historical Provider, MD  carvedilol (COREG) 25 MG tablet Take 0.5 tablets (12.5 mg total) by mouth 2  (two) times daily with a meal. Take 1/2 tablet twice daily 08/18/14  Yes Jerline Pain, MD  glipiZIDE (GLUCOTROL) 5 MG tablet Take 5 mg by mouth daily before breakfast.   Yes Historical Provider, MD  metFORMIN (GLUCOPHAGE) 1000 MG tablet Take 1,000 mg by mouth 2 (two) times daily with a meal.   Yes Historical Provider, MD  polyethylene glycol (MIRALAX / GLYCOLAX) packet Take 17 g by mouth daily. 08/03/14  Yes Samella Parr, NP  ALPRAZolam Duanne Moron) 0.25 MG tablet Take 1 tablet (0.25 mg total) by mouth 3 (three) times daily as needed for anxiety. Patient not taking: Reported on 08/27/2014 08/03/14   Samella Parr, NP  apixaban (ELIQUIS) 5 MG TABS tablet Take 1 tablet (5 mg total) by mouth 2 (two) times daily. Patient not taking: Reported on 11/23/2014 08/03/14   Samella Parr, NP  atorvastatin (LIPITOR) 10 MG tablet Take 1 tablet (10 mg total) by mouth every morning. Patient not taking: Reported on 09/13/2014 08/09/14   Jerline Pain, MD  insulin aspart (NOVOLOG) 100 UNIT/ML injection Inject 6 Units into the skin 3 (three) times daily with meals. Patient not taking: Reported on 08/18/2014 08/07/14   Sid Falcon, MD  insulin glargine (LANTUS) 100 UNIT/ML injection Inject 0.25 mLs (25 Units total) into the skin at bedtime. Patient not taking: Reported on 08/18/2014 08/05/14   Samella Parr, NP  oxyCODONE (OXY IR/ROXICODONE) 5 MG immediate release tablet Take 1 tablet (5 mg total) by mouth every 6 (six) hours as needed for moderate pain. Patient not taking: Reported on 09/13/2014 08/07/14   Sid Falcon, MD   BP 164/74 mmHg  Pulse 77  Temp(Src) 98.9 F (37.2 C) (Oral)  Resp 14  SpO2 98%   Physical Exam  Constitutional: He is oriented to person, place, and time. He appears well-developed and well-nourished. No distress.  HENT:  Head: Normocephalic and atraumatic.  Mouth/Throat: Oropharynx is clear and moist.  Eyes: Conjunctivae and EOM are normal. Pupils are equal, round, and reactive  to light.  Neck: Normal range of motion. Neck supple.  Cardiovascular: Normal rate, regular rhythm and normal heart sounds.   Pulmonary/Chest: Effort normal and breath sounds normal. No respiratory distress. He has no wheezes.  Abdominal: Soft. Bowel sounds are normal. There is no tenderness. There is no guarding.  Musculoskeletal: Normal range of motion. He exhibits no edema.  Right foot with wound on plantar surface of right 5th toe with surrounding cellulitis extending to dorsal surface; there is some purulent, foul smelling drainage noted Wound of right lateral calf with some mild drainage noted; surrounding erythema noted Leg remains NVI, normal cap refill, slightly decreased sensation of distal toes at baseline (unchanged)  Neurological: He is alert and oriented to person, place, and time.  Skin: Skin is warm and dry. He is not diaphoretic.  Psychiatric: He has a normal mood and affect.  Nursing note and vitals reviewed.  ED Course  Procedures (including critical care time) Labs Review Labs Reviewed  I-STAT CHEM 8, ED - Abnormal; Notable for the following:    Sodium 133 (*)    Glucose, Bld 453 (*)    All other components within normal limits  CBG MONITORING, ED - Abnormal; Notable for the following:    Glucose-Capillary 292 (*)    All other components within normal limits  CBG MONITORING, ED - Abnormal; Notable for the following:    Glucose-Capillary 190 (*)    All other components within normal limits  CBC WITH DIFFERENTIAL/PLATELET    Imaging Review Dg Foot Complete Right  11/23/2014   CLINICAL DATA:  Open sore and pain to the bottom of the right foot at the fifth toe and fifth metatarsal region for 2 weeks. Diabetic. Foot pain.  EXAM: RIGHT FOOT COMPLETE - 3+ VIEW  COMPARISON:  None.  FINDINGS: Soft tissue swelling and gas over the lateral aspect of the right foot at the level of the metatarsal phalangeal joint consistent with history of  ulceration. Underlying bones appear intact. There is no evidence of significant bone destruction or cortical loss to suggest osteomyelitis. No acute fracture or dislocation. Vascular calcifications are present.  IMPRESSION: Soft tissue ulceration over the right fifth metatarsal phalangeal region. No bone changes to suggest osteomyelitis.   Electronically Signed   By: Lucienne Capers M.D.   On: 11/23/2014 20:25     EKG Interpretation None      MDM   Final diagnoses:  Diabetic ulcer of right foot associated with type 2 diabetes mellitus  Hyperglycemia  Type 2 diabetes mellitus with foot ulcer   69 year old male here with diabetic wounds of his right foot and right lateral calf. He had wound cultures performed one week ago which was sensitive to Augmentin which he has been taking without relief. Patient is afebrile and nontoxic in appearance. There is purulent drainage from his wounds, somewhat foul-smelling. Lab work with elevated CBG at 453, patient does admit that he has not been taking his diabetes medications as directed. Plain films were obtained, no evidence of osteomyelitis. Given failed outpatient management, feel patient would benefit from admission for IV antibiotics and wound care. Patient started on vancomycin and Zosyn, wounds sensitive to this per culture report.  Case discussed with hospitalist, Dr. Humphrey Rolls, who evaluated patient in the ED and will admit for further management.  Larene Pickett, PA-C 11/23/14 2157  Alfonzo Beers, MD 11/23/14 2157

## 2014-11-23 NOTE — ED Notes (Signed)
Pt st's he is diabetic and has a open sore on bottom of right foot.  St's first started about 2 weeks ago and continues to get bigger.  Pt st's he was advised by his MD to come to ED for futher eval.

## 2014-11-23 NOTE — H&P (Signed)
[facility] History and Physical  DARREON LUTES ZWC:[id number] DOB: 07-09-1946 DOA: 11/23/2014  Referring physician: Quincy Carnes PA PCP: [name], MD   Chief Complaint: Right Leg and foot cellulitis  HPI: Thomas Wright is a 69 y.o. male presents with cellulitis of the leg. Patient has been having some issues with his legs for about 2 weeks. He states he noted a burn looking area on his leg initially and tried to manage it himself. Patient went to his doctor last week was given antibiotics amoxicillin. He was sent to the podiatrist for further evaluation but did not go as he wax not able to pay the copay. Patient states that he was not taking the antibiotics the way they were prescribed. He states that his bottom of the foot was getting worse. He states that he been to the doctor and was told to come to the ED> He has not had any fevers. He states that he has not had major pain either. He has experienced cold feet and also states that he has neuropathy. He states that he does not recall having any injury to the leg other than when he went out to get his dog barefoot about a month ago.   Review of Systems:  Constitutional:  No weight loss, night sweats, Fevers, chills HEENT:  No headaches, ifficulty swallowing Cardio-vascular:  No chest pain, Orthopnea, PND, swelling in lower extremities, anasarca GI:  No heartburn, indigestion, abdominal pain, nausea, vomiting, diarrhea Resp:  No shortness of breath with exertion or at rest. No coughing up of blood. Skin:  Right leg ulcerated lesion lateral aspect of lower leg GU:  no dysuria, change in color of urine, no urgency or frequency. Musculoskeletal:  No joint pain or swelling. No decreased range of motion Psych:  No change in mood or affect. No depression or anxiety  Past Medical History  Diagnosis Date  . Diabetes mellitus   . Hypertension   . Atrial fibrillation   . Hypertriglyceridemia   . Afib     Stopped  amiodarone 2013-no further episodes of atrial fibrillation. Postoperative 2008  . H/O aortic valve replacement with tissue graft     2008.  . Back pain   . Cardiomyopathy     EF originally 20%-now 45%, no CAD on cath   Past Surgical History  Procedure Laterality Date  . Cardiac surgery  05/2007    Aortic Valve Replacement  . I&d extremity Right 05/05/2014    Procedure: IRRIGATION AND DEBRIDEMENT EXTREMITY;  Surgeon: [name], MD;  Location: [place];  Service: Orthopedics;  Laterality: Right;  . Tee without cardioversion N/A 07/23/2014    Procedure: TRANSESOPHAGEAL ECHOCARDIOGRAM (TEE);  Surgeon: Josue Hector, MD;  Location: [facility] ENDOSCOPY;  Service: Cardiovascular;  Laterality: N/A;  . Cardioversion N/A 07/23/2014    Procedure: CARDIOVERSION;  Surgeon: Josue Hector, MD;  Location: [facility] ENDOSCOPY;  Service: Cardiovascular;  Laterality: N/A;  . Tee without cardioversion N/A 09/01/2014    Procedure: TRANSESOPHAGEAL ECHOCARDIOGRAM (TEE);  Surgeon: [name], MD;  Location: [facility] ENDOSCOPY;  Service: Cardiovascular;  Laterality: N/A;  . Cardioversion N/A 09/01/2014    Procedure: CARDIOVERSION;  Surgeon: [name], MD;  Location: [facility] ENDOSCOPY;  Service: Cardiovascular;  Laterality: N/A;  . Amputation Right 07/29/2014    Procedure: AMPUTATION RIGHT LONG FINGER;  Surgeon: [name], MD;  Location: [place];  Service: Orthopedics;  Laterality: Right;   Social History:  reports that he has never smoked. He has never used smokeless tobacco. He  reports that he does not drink alcohol or use illicit drugs.  No Known Allergies  Family History  Problem Relation Age of Onset  . Colon cancer Father   . Liver disease Brother   . Heart murmur Child   . Congenital heart disease Other      Prior to Admission medications   Medication Sig Start Date End Date Taking? Authorizing Provider  amiodarone (PACERONE) 200 MG tablet Take 1 tablet (200 mg total) by mouth daily. 09/13/14  Yes Luke K Kilroy,  PA-C  amoxicillin (AMOXIL) 500 MG capsule Take 500 mg by mouth daily as needed. Before dental appointment 08/17/14  Yes Historical Provider, MD  aspirin EC 81 MG tablet Take 81 mg by mouth daily.   Yes Historical Provider, MD  carvedilol (COREG) 25 MG tablet Take 0.5 tablets (12.5 mg total) by mouth 2 (two) times daily with a meal. Take 1/2 tablet twice daily 08/18/14  Yes Jerline Pain, MD  glipiZIDE (GLUCOTROL) 5 MG tablet Take 5 mg by mouth daily before breakfast.   Yes Historical Provider, MD  metFORMIN (GLUCOPHAGE) 1000 MG tablet Take 1,000 mg by mouth 2 (two) times daily with a meal.   Yes Historical Provider, MD  polyethylene glycol (MIRALAX / GLYCOLAX) packet Take 17 g by mouth daily. 08/03/14  Yes Samella Parr, NP  ALPRAZolam Duanne Moron) 0.25 MG tablet Take 1 tablet (0.25 mg total) by mouth 3 (three) times daily as needed for anxiety. Patient not taking: Reported on 08/27/2014 08/03/14   Samella Parr, NP  apixaban (ELIQUIS) 5 MG TABS tablet Take 1 tablet (5 mg total) by mouth 2 (two) times daily. Patient not taking: Reported on 11/23/2014 08/03/14   Samella Parr, NP  atorvastatin (LIPITOR) 10 MG tablet Take 1 tablet (10 mg total) by mouth every morning. Patient not taking: Reported on 09/13/2014 08/09/14   Jerline Pain, MD  insulin aspart (NOVOLOG) 100 UNIT/ML injection Inject 6 Units into the skin 3 (three) times daily with meals. Patient not taking: Reported on 08/18/2014 08/07/14   Sid Falcon, MD  insulin glargine (LANTUS) 100 UNIT/ML injection Inject 0.25 mLs (25 Units total) into the skin at bedtime. Patient not taking: Reported on 08/18/2014 08/05/14   Samella Parr, NP  oxyCODONE (OXY IR/ROXICODONE) 5 MG immediate release tablet Take 1 tablet (5 mg total) by mouth every 6 (six) hours as needed for moderate pain. Patient not taking: Reported on 09/13/2014 08/07/14   Sid Falcon, MD   Physical Exam: Filed Vitals:   11/23/14 1721 11/23/14 1930 11/23/14 2028 11/23/14 2100    BP: 164/74 144/81 144/81 182/86  Pulse: 77 72 74 71  Temp: 98.9 F (37.2 C)     TempSrc: Oral     Resp: 14  16 17   SpO2: 98% 98% 99% 100%    Wt Readings from Last 3 Encounters:  09/13/14 97.433 kg (214 lb 12.8 oz)  09/01/14 94.348 kg (208 lb)  08/18/14 94.711 kg (208 lb 12.8 oz)    General:  Appears calm and comfortable Eyes: PERRL, normal lids, irises & conjunctiva ENT: grossly normal hearing, lips & tongue Neck: no LAD, masses or thyromegaly Cardiovascular: RRR, no m/r/g. ++ LE edema. Respiratory: CTA bilaterally, no w/r/r. Normal respiratory effort. Abdomen: soft, ntnd Skin: ++rash ++ulceration and edema of the right lower leg Musculoskeletal: grossly normal tone BUE/BLE Psychiatric: grossly normal mood and affect, speech fluent and appropriate Neurologic: grossly non-focal.          Labs  on Admission:  Basic Metabolic Panel:  Recent Labs Lab 11/23/14 1744  NA 133*  K 4.4  CL 96  GLUCOSE 453*  BUN 19  CREATININE 0.80   Liver Function Tests: No results for input(s): AST, ALT, ALKPHOS, BILITOT, PROT, ALBUMIN in the last 168 hours. No results for input(s): LIPASE, AMYLASE in the last 168 hours. No results for input(s): AMMONIA in the last 168 hours. CBC:  Recent Labs Lab 11/23/14 1730 11/23/14 1744  WBC 9.9  --   NEUTROABS 5.7  --   HGB 14.2 15.6  HCT 41.3 46.0  MCV 80.4  --   PLT 303  --    Cardiac Enzymes: No results for input(s): CKTOTAL, CKMB, CKMBINDEX, TROPONINI in the last 168 hours.  BNP (last 3 results) No results for input(s): BNP in the last 8760 hours.  ProBNP (last 3 results) No results for input(s): PROBNP in the last 8760 hours.  CBG:  Recent Labs Lab 11/23/14 2057  GLUCAP 292*    Radiological Exams on Admission: Dg Foot Complete Right  11/23/2014   CLINICAL DATA:  Open sore and pain to the bottom of the right foot at the fifth toe and fifth metatarsal region for 2 weeks. Diabetic. Foot pain.  EXAM: RIGHT FOOT COMPLETE - 3+  VIEW  COMPARISON:  None.  FINDINGS: Soft tissue swelling and gas over the lateral aspect of the right foot at the level of the metatarsal phalangeal joint consistent with history of ulceration. Underlying bones appear intact. There is no evidence of significant bone destruction or cortical loss to suggest osteomyelitis. No acute fracture or dislocation. Vascular calcifications are present.  IMPRESSION: Soft tissue ulceration over the right fifth metatarsal phalangeal region. No bone changes to suggest osteomyelitis.   Electronically Signed   By: Lucienne Capers M.D.   On: 11/23/2014 20:25     Assessment/Plan Active Problems:   Diabetes   Cellulitis of leg   1. Cellulitis of the right leg -will start on vancocin and zosyn -will get wound care consult -may need vascular evaluation  2. Diabetes Mellitus with peripheral neuropathy -will check A1C -started on insulin coverage -will monitor FSBS  3. Hyponatremia -related to hyperglycemia -will monitor labs    Code Status: Full Code (must indicate code status--if unknown or must be presumed, indicate so) DVT Prophylaxis:Heparin Family Communication: Wife (indicate person spoken with, if applicable, with phone number if by telephone) Disposition Plan: Home (indicate anticipated LOS)  Time spent: 21min  KHAN,SAADAT A [facility] Pager [phone]

## 2014-11-23 NOTE — ED Notes (Signed)
Patient transported to X-ray 

## 2014-11-23 NOTE — Progress Notes (Signed)
Patient arrived around 2215 from ED, he does not ex[press any discomfort and is resting comfortably. Will continue to monitor.

## 2014-11-24 DIAGNOSIS — E1159 Type 2 diabetes mellitus with other circulatory complications: Secondary | ICD-10-CM

## 2014-11-24 DIAGNOSIS — I10 Essential (primary) hypertension: Secondary | ICD-10-CM

## 2014-11-24 DIAGNOSIS — E11621 Type 2 diabetes mellitus with foot ulcer: Principal | ICD-10-CM

## 2014-11-24 DIAGNOSIS — L97519 Non-pressure chronic ulcer of other part of right foot with unspecified severity: Secondary | ICD-10-CM

## 2014-11-24 LAB — CBC
HCT: 39.9 % (ref 39.0–52.0)
HEMATOCRIT: 40.8 % (ref 39.0–52.0)
Hemoglobin: 14.3 g/dL (ref 13.0–17.0)
Hemoglobin: 13.5 g/dL (ref 13.0–17.0)
MCH: 27.1 pg (ref 26.0–34.0)
MCH: 27.9 pg (ref 26.0–34.0)
MCHC: 35 g/dL (ref 30.0–36.0)
MCHC: 33.8 g/dL (ref 30.0–36.0)
MCV: 79.5 fL (ref 78.0–100.0)
MCV: 80.1 fL (ref 78.0–100.0)
PLATELETS: 258 10*3/uL (ref 150–400)
Platelets: 281 10*3/uL (ref 150–400)
RBC: 4.98 MIL/uL (ref 4.22–5.81)
RBC: 5.13 MIL/uL (ref 4.22–5.81)
RDW: 14.1 % (ref 11.5–15.5)
RDW: 13.8 % (ref 11.5–15.5)
WBC: 11.1 10*3/uL — ABNORMAL HIGH (ref 4.0–10.5)
WBC: 7.4 10*3/uL (ref 4.0–10.5)

## 2014-11-24 LAB — COMPREHENSIVE METABOLIC PANEL
ALT: 13 U/L (ref 0–53)
AST: 16 U/L (ref 0–37)
Albumin: 3 g/dL — ABNORMAL LOW (ref 3.5–5.2)
Alkaline Phosphatase: 72 U/L (ref 39–117)
Anion gap: 5 (ref 5–15)
BUN: 11 mg/dL (ref 6–23)
CO2: 30 mmol/L (ref 19–32)
CREATININE: 0.8 mg/dL (ref 0.50–1.35)
Calcium: 8.7 mg/dL (ref 8.4–10.5)
Chloride: 99 mmol/L (ref 96–112)
GFR calc Af Amer: >90 mL/min (ref >90)
GFR calc non Af Amer: >90 mL/min (ref >90)
Glucose, Bld: 296 mg/dL — ABNORMAL HIGH (ref 70–99)
Potassium: 4.9 mmol/L (ref 3.5–5.1)
Sodium: 134 mmol/L — ABNORMAL LOW (ref 135–145)
Total Bilirubin: 0.7 mg/dL (ref 0.3–1.2)
Total Protein: 6.4 g/dL (ref 6.0–8.3)

## 2014-11-24 LAB — GLUCOSE, CAPILLARY
GLUCOSE-CAPILLARY: 192 mg/dL — AB (ref 70–99)
Glucose-Capillary: 166 mg/dL — ABNORMAL HIGH (ref 70–99)
Glucose-Capillary: 154 mg/dL — ABNORMAL HIGH (ref 70–99)
Glucose-Capillary: 259 mg/dL — ABNORMAL HIGH (ref 70–99)
Glucose-Capillary: 319 mg/dL — ABNORMAL HIGH (ref 70–99)
Glucose-Capillary: 237 mg/dL — ABNORMAL HIGH (ref 70–99)

## 2014-11-24 LAB — CREATININE, SERUM
Creatinine, Ser: 0.77 mg/dL (ref 0.50–1.35)
GFR calc Af Amer: >90 mL/min (ref >90)

## 2014-11-24 LAB — TSH: TSH: 2.225 u[IU]/mL (ref 0.350–4.500)

## 2014-11-24 MED ORDER — VANCOMYCIN HCL 10 G IV SOLR
1250.0000 mg | Freq: Two times a day (BID) | INTRAVENOUS | Status: DC
  Administered 2014-11-24 (×2): 1250 mg via INTRAVENOUS
  Filled 2014-11-24 (×5): qty 1250

## 2014-11-24 MED ORDER — MUPIROCIN 2 % EX OINT
TOPICAL_OINTMENT | Freq: Two times a day (BID) | CUTANEOUS | Status: DC
  Administered 2014-11-24 (×2): 1 via TOPICAL
  Administered 2014-11-25: 14:00:00 via TOPICAL
  Filled 2014-11-24 (×2): qty 22

## 2014-11-24 MED ORDER — MUPIROCIN 2 % EX OINT: TOPICAL_OINTMENT | Freq: Two times a day (BID) | CUTANEOUS | Status: DC

## 2014-11-24 MED ORDER — PIPERACILLIN-TAZOBACTAM 3.375 G IVPB 30 MIN
3.3750 g | Freq: Once | INTRAVENOUS | Status: DC
  Filled 2014-11-24: qty 50

## 2014-11-24 MED ORDER — PIPERACILLIN-TAZOBACTAM 3.375 G IVPB
3.3750 g | Freq: Three times a day (TID) | INTRAVENOUS | Status: DC
  Administered 2014-11-24 – 2014-11-25 (×3): 3.375 g via INTRAVENOUS
  Filled 2014-11-24 (×7): qty 50

## 2014-11-24 MED ORDER — VANCOMYCIN HCL IN DEXTROSE 1-5 GM/200ML-% IV SOLN
1000.0000 mg | Freq: Once | INTRAVENOUS | Status: DC
  Filled 2014-11-24: qty 200
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
## 2014-11-24 NOTE — Clinical Social Work Note (Signed)
Clinical Social Worker received referral for medication assistance.  Chart reviewed and no other social work needs identified.  Spoke with RN Case Manager who will follow up with patient to discuss medication assistance.    CSW signing off - please re consult if social work needs arise.  Barbette Or, [place]

## 2014-11-24 NOTE — Progress Notes (Signed)
Inpatient Diabetes Program Recommendations  AACE/ADA: New Consensus Statement on Inpatient Glycemic Control (2013)  Target Ranges:  Prepandial:   less than 140 mg/dL      Peak postprandial:   less than 180 mg/dL (1-2 hours)      Critically ill patients:  140 - 180 mg/dL   Rather than covering cbg's with correction q 4 hrs, please consider the following: Inpatient Diabetes Program Recommendations Insulin - Basal: Pt on lantus 25 units at home Please consider adding 10-15 units while here Correction (SSI): Ordered moderate correction scale q 4 hrs-may want to change to tidwc and add the HS scale Insulin - Meal Coverage: Pt takes 6 units meal coverage tidwc Oral Agents: Home-glipizide 5 mg and januvia HgbA1C: ordered-pending results  Thank you Rosita Kea, RN, MSN, CDE  Diabetes Inpatient Program Office: [phone] Pager: [phone] 8:00 am to 5:00 pm

## 2014-11-24 NOTE — Progress Notes (Addendum)
Patient ID: Thomas Wright, male   DOB: 1946-07-24, 69 y.o.   MRN: [id number] [facility] PROGRESS NOTE  MARSHA HILLMAN KDX:[id number] DOB: Jan 21, 1946 DOA: 11/23/2014 PCP: [name], MD  Brief narrative:    69 y.o. male with past medical history of diabetes, hypertension, atrial fibrillation, AVR with tissue graft who presented to [facility] ED with worsening infection in right foot and above ankle worsening over past 2 weeks prior to this admission. PCP apparently gave amoxicillin and referred to podiatrist but pt never went due to limited financial resources at the time.  On admission, pt hemodynamically stable. No evidence of osteomyelitis on x ray. Started on broad spectrum antibiotics.   Assessment/Plan:    Principal Problem: Right foot and right calf cellulitis / diabetic foot ulcer / Leukocytosis  - Appreciate WOC assessment. Right plantar foot with full thickness wound: .2 x .2 x .2 cm, dry yellow wound bed with surrounding dry callous. Right outer calf with 3 full thickness wounds: Total area of 3 wounds is 7 x 6 cm. Inner wound is 4 x 3 cm and is covered with dry, soft brown eschar. Outer 2 wounds surrounding are semi-circular, red and moist, no odor or drainage, or fluctuance, each 3 x 3 x .1cm Dressing changes requirement: Shower Q day after discharge to soften nonviable skin, then apply Bactroban to promote moist healing. Foam dressings applied to protect sites from further injury.  - We will continue current broad spectrum abx until we see that wound healing improving - X ray with no evidence of osteomyelitis  - Continue pain management efforts.  Active Problems:   Diabetes mellitus with peripheral circulatory manifestations, diabetic foot ulcer - A1c is pending - Continue Glipizide, metformin and SSI    History of atrial fibrillation / History of aortic valve replacement with tissue graft - Rate controlled with amiodarone, coreg - Continue aspirin daily     Essential hypertension - Continue coreg 12.5 mg PO BID   DVT Prophylaxis  - Heparin subQ ordered    Code Status: Full.  Family Communication:  plan of care discussed with the patient; family not at the bedside this am Disposition Plan: Home once wound improves.   IV access:  Peripheral IV  Procedures and diagnostic studies:    Dg Foot Complete Right 11/23/2014    Soft tissue ulceration over the right fifth metatarsal phalangeal region. No bone changes to suggest osteomyelitis.    Medical Consultants:  None   Other Consultants:  [place]  IAnti-Infectives:   Vanco 11/23/2014 --> Zosyn 11/23/2014 -->   [name], MD  [facility] Pager [phone]  If 7PM-7AM, please contact night-coverage [email or web address] Password [email or web address] 11/24/2014, 11:07 AM   LOS: 1 day    HPI/Subjective: No acute overnight events.  Objective: Filed Vitals:   11/23/14 2227 11/23/14 2228 11/23/14 2300 11/24/14 0504  BP: 177/77   152/64  Pulse: 77   73  Temp: 98.4 F (36.9 C)   98.3 F (36.8 C)  TempSrc: Oral   Oral  Resp: 18   15  Height:   6' (1.829 m)   Weight:   99.837 kg (220 lb 1.6 oz)   SpO2: 100% 100%  97%    Intake/Output Summary (Last 24 hours) at 11/24/14 1107 Last data filed at 11/24/14 1024  Gross per 24 hour  Intake   2655 ml  Output   1400 ml  Net   1255 ml    Exam:   General:  Pt is alert, follows  commands appropriately, not in acute distress  Cardiovascular: Regular rate and rhythm, S1/S2 (+)  Respiratory: Clear to auscultation bilaterally, no wheezing, no crackles, no rhonchi  Abdomen: Soft, non tender, non distended, bowel sounds present  Extremities: right foot ulceration, surrounding erythema on lateral side above ankle, pulses DP and PT palpable bilaterally  Neuro: Grossly nonfocal  Data Reviewed: Basic Metabolic Panel:  Recent Labs Lab 11/23/14 1744 11/24/14 0057  NA 133*  --   K 4.4  --   CL 96  --   GLUCOSE 453*  --   BUN 19  --   CREATININE 0.80  0.77   Liver Function Tests: No results for input(s): AST, ALT, ALKPHOS, BILITOT, PROT, ALBUMIN in the last 168 hours. No results for input(s): LIPASE, AMYLASE in the last 168 hours. No results for input(s): AMMONIA in the last 168 hours. CBC:  Recent Labs Lab 11/23/14 1730 11/23/14 1744 11/24/14 0057  WBC 9.9  --  11.1*  NEUTROABS 5.7  --   --   HGB 14.2 15.6 14.3  HCT 41.3 46.0 40.8  MCV 80.4  --  79.5  PLT 303  --  258   Cardiac Enzymes: No results for input(s): CKTOTAL, CKMB, CKMBINDEX, TROPONINI in the last 168 hours. BNP: Invalid input(s): POCBNP CBG:  Recent Labs Lab 11/23/14 2057 11/23/14 2147 11/23/14 2344 11/24/14 0406 11/24/14 0740  GLUCAP 292* 190* 192* 166* 154*    No results found for this or any previous visit (from the past 240 hour(s)).   Scheduled Meds: . amiodarone  200 mg Oral Daily  . aspirin EC  81 mg Oral Daily  . carvedilol  12.5 mg Oral BID WC  . folic acid  1 mg Oral Daily  . glipiZIDE  5 mg Oral QAC breakfast  . heparin  5,000 Units Subcutaneous 3 times per day  . insulin aspart  0-15 Units Subcutaneous 6 times per day  . metFORMIN  1,000 mg Oral BID WC  . multivitamin with minerals  1 tablet Oral Daily  . mupirocin ointment   Topical BID  . piperacillin-tazobactam (ZOSYN)  IV  3.375 g Intravenous Q8H  . polyethylene glycol  17 g Oral Daily  . thiamine  100 mg Oral Daily  . vancomycin  1,250 mg Intravenous Q12H   Continuous Infusions: . sodium chloride 1,000 mL (11/23/14 2232)

## 2014-11-25 DIAGNOSIS — I482 Chronic atrial fibrillation: Secondary | ICD-10-CM

## 2014-11-25 LAB — GLUCOSE, CAPILLARY
GLUCOSE-CAPILLARY: 145 mg/dL — AB (ref 70–99)
Glucose-Capillary: 114 mg/dL — ABNORMAL HIGH (ref 70–99)
Glucose-Capillary: 158 mg/dL — ABNORMAL HIGH (ref 70–99)
Glucose-Capillary: 164 mg/dL — ABNORMAL HIGH (ref 70–99)

## 2014-11-25 LAB — HEMOGLOBIN A1C
Hgb A1c MFr Bld: 12.1 % — ABNORMAL HIGH (ref 4.8–5.6)
Mean Plasma Glucose: 301 mg/dL

## 2014-11-25 MED ORDER — AMOXICILLIN-POT CLAVULANATE 875-125 MG PO TABS: 1.0000 | ORAL_TABLET | Freq: Two times a day (BID) | ORAL | Status: DC

## 2014-11-25 MED ORDER — INSULIN GLARGINE 100 UNIT/ML ~~LOC~~ SOLN
25.0000 [IU] | Freq: Every day | SUBCUTANEOUS | Status: DC
Start: 2014-11-25 — End: 2015-05-31

## 2014-11-25 MED ORDER — DOXYCYCLINE MONOHYDRATE 100 MG PO TABS: 100.0000 mg | ORAL_TABLET | Freq: Two times a day (BID) | ORAL | Status: DC

## 2014-11-25 NOTE — Progress Notes (Signed)
Discussed discharge instructions with pt and family.  Prescriptions given and PIV removed.  Pt taken to discharge location via wheelchair.

## 2014-11-25 NOTE — Progress Notes (Signed)
Pt has been unable to afford the lantus using his Medicare supplement, Gold insurance-Pt can use the lantus savings carrd with his medicare supplement "[employer]. Paged Dr Rogers Blocker to order his lantus as he has not been able to afford for the past 8 years. Needs a new script. Gave pt the savings card for the lantus.  Pt will need to then apply for the patient assistance program.on line to receive further financial assistance.  Please call if I can assist in any other way. Gave pt and wife my contact information.  Thank you Rosita Kea, RN, MSN, CDE  Diabetes Inpatient Program Office: [phone] Pager: [phone] 8:00 am to 5:00 pm

## 2014-11-25 NOTE — Discharge Summary (Addendum)
Physician Discharge Summary  Thomas Wright NID:[id number] DOB: 07/19/1946 DOA: 11/23/2014  PCP: [name], MD  Admit date: 11/23/2014 Discharge date: 11/25/2014  Recommendations for Outpatient Follow-up:  1. Please continue doxycycline and Augmentin for 8 days on discharge. Please follow-up with primary care physician per scheduled appointment.   Discharge Diagnoses:  Active Problems:   Diabetes   Cellulitis of leg    Discharge Condition: stable   Diet recommendation: as tolerated   History of present illness:  69 y.o. male with past medical history of diabetes, hypertension, atrial fibrillation, AVR with tissue graft who presented to [facility] ED with worsening infection in right foot and above ankle worsening over past 2 weeks prior to this admission. PCP apparently gave amoxicillin and referred to podiatrist but pt never went due to limited financial resources at the time.  On admission, pt hemodynamically stable. No evidence of osteomyelitis on x ray. Started on broad spectrum antibiotics.   Assessment/Plan:    Principal Problem: Right foot and right calf cellulitis / diabetic foot ulcer / Leukocytosis  - Appreciate WOC assessment. Right plantar foot with full thickness wound: .2 x .2 x .2 cm, dry yellow wound bed with surrounding dry callous. Right outer calf with 3 full thickness wounds: Total area of 3 wounds is 7 x 6 cm. Inner wound is 4 x 3 cm and is covered with dry, soft brown eschar. Outer 2 wounds surrounding are semi-circular, red and moist, no odor or drainage, or fluctuance, each 3 x 3 x .1cm Dressing changes requirement: Shower Q day after discharge to soften nonviable skin, then apply Bactroban to promote moist healing. Foam dressings applied to protect sites from further injury.  - Patient was started on broad-spectrum antibiotics on the admission vancomycin and Zosyn. There is no evidence of osteomyelitis on x-ray. - Patient will continue doxycycline  and Augmentin on discharge for 8 days   Active Problems:  Diabetes mellitus with peripheral circulatory manifestations, diabetic foot ulcer - A1c is pending,  can be followed up by primary care physician  - Continue Glipizide, metformin, Lantus on discharge. We will defer to primary care physician decision whether the patient needs Lantus or not. - Patient was seen by diabetic coordinator who did recommend continuing Lantus.    History of atrial fibrillation / History of aortic valve replacement with tissue graft - Rate controlled with amiodarone, coreg - Continue aspirin daily   Essential hypertension - Continue coreg 12.5 mg PO BID   DVT Prophylaxis  - Heparin subQ ordered while patient in hospital.    Code Status: Full.  Family Communication: plan of care discussed with the patient; family not at the bedside this am   IV access:  Peripheral IV  Procedures and diagnostic studies:   Dg Foot Complete Right 11/23/2014 Soft tissue ulceration over the right fifth metatarsal phalangeal region. No bone changes to suggest osteomyelitis.  Medical Consultants:  None   Other Consultants:  [place] Diabetic coordinator  IAnti-Infectives:   Vanco 11/23/2014 --> 11/25/2014  Zosyn 11/23/2014 -->11/25/2014   Signed:  [name], MD  [facility] 11/25/2014, 9:41 AM  Pager #: [phone]   Discharge Exam: Filed Vitals:   11/25/14 0606  BP: 163/64  Pulse: 65  Temp: 98.6 F (37 C)  Resp: 16   Filed Vitals:   11/24/14 0504 11/24/14 1605 11/24/14 2227 11/25/14 0606  BP: 152/64 140/63 128/43 163/64  Pulse: 73  60 65  Temp: 98.3 F (36.8 C) 98.4 F (36.9 C) 98.7 F (37.1  C) 98.6 F (37 C)  TempSrc: Oral Oral Oral Oral  Resp: 15 16 16 16   Height:      Weight:      SpO2: 97% 97% 99% 97%    General: Pt is alert, follows commands appropriately, not in acute distress Cardiovascular: Regular rate and rhythm, S1/S2 +, no murmurs Respiratory:  Clear to auscultation bilaterally, no wheezing, no crackles, no rhonchi Abdominal: Soft, non tender, non distended, bowel sounds +, no guarding Extremities: no edema, dressing in place over right lateral lower extremity right above ankle and on foot ulcer, no cyanosis, pulses palpable bilaterally DP and PT Neuro: Grossly nonfocal  Discharge Instructions  Discharge Instructions    Call MD for:  difficulty breathing, headache or visual disturbances    Complete by:  As directed      Call MD for:  persistant nausea and vomiting    Complete by:  As directed      Call MD for:  redness, tenderness, or signs of infection (pain, swelling, redness, odor or green/yellow discharge around incision site)    Complete by:  As directed      Call MD for:  severe uncontrolled pain    Complete by:  As directed      Diet - low sodium heart healthy    Complete by:  As directed      Discharge instructions    Complete by:  As directed   1. Please continue doxycycline and Augmentin for 8 days on discharge. Please follow-up with primary care physician per scheduled appointment.     Increase activity slowly    Complete by:  As directed             Medication List    STOP taking these medications        apixaban 5 MG Tabs tablet  Commonly known as:  ELIQUIS     insulin aspart 100 UNIT/ML injection  Commonly known as:  novoLOG     oxyCODONE 5 MG immediate release tablet  Commonly known as:  Oxy IR/ROXICODONE      TAKE these medications        amiodarone 200 MG tablet  Commonly known as:  PACERONE  Take 1 tablet (200 mg total) by mouth daily.     amoxicillin-clavulanate 875-125 MG per tablet  Commonly known as:  AUGMENTIN  Take 1 tablet by mouth 2 (two) times daily. Started on Wednesday 11-17-14     aspirin EC 81 MG tablet  Take 81 mg by mouth daily.     atorvastatin 10 MG tablet  Commonly known as:  LIPITOR  Take 10 mg by mouth daily.     carvedilol 25 MG tablet  Commonly known as:  COREG   Take 0.5 tablets (12.5 mg total) by mouth 2 (two) times daily with a meal. Take 1/2 tablet twice daily     doxycycline 100 MG tablet  Commonly known as:  ADOXA  Take 1 tablet (100 mg total) by mouth 2 (two) times daily.     glipiZIDE 5 MG tablet  Commonly known as:  GLUCOTROL  Take 5 mg by mouth daily before breakfast.     insulin glargine 100 UNIT/ML injection  Commonly known as:  LANTUS  Inject 0.25 mLs (25 Units total) into the skin at bedtime.     metFORMIN 1000 MG tablet  Commonly known as:  GLUCOPHAGE  Take 1,000 mg by mouth 2 (two) times daily with a meal.  Follow-up Information    Follow up with [name], MD. Schedule an appointment as soon as possible for a visit in 2 weeks.   Specialty:  Family Medicine   Why:  Follow up appt after recent hospitalization   Contact information:   [place] [id number]        The results of significant diagnostics from this hospitalization (including imaging, microbiology, ancillary and laboratory) are listed below for reference.    Significant Diagnostic Studies: Dg Foot Complete Right  12-03-2014   CLINICAL DATA:  Open sore and pain to the bottom of the right foot at the fifth toe and fifth metatarsal region for 2 weeks. Diabetic. Foot pain.  EXAM: RIGHT FOOT COMPLETE - 3+ VIEW  COMPARISON:  None.  FINDINGS: Soft tissue swelling and gas over the lateral aspect of the right foot at the level of the metatarsal phalangeal joint consistent with history of ulceration. Underlying bones appear intact. There is no evidence of significant bone destruction or cortical loss to suggest osteomyelitis. No acute fracture or dislocation. Vascular calcifications are present.  IMPRESSION: Soft tissue ulceration over the right fifth metatarsal phalangeal region. No bone changes to suggest osteomyelitis.   Electronically Signed   By: Lucienne Capers M.D.   On: 2014/12/03 20:25    Microbiology: No results  found for this or any previous visit (from the past 240 hour(s)).   Labs: Basic Metabolic Panel:  Recent Labs Lab 2014-12-03 1744 11/24/14 0057 11/24/14 1132  NA 133*  --  134*  K 4.4  --  4.9  CL 96  --  99  CO2  --   --  30  GLUCOSE 453*  --  296*  BUN 19  --  11  CREATININE 0.80 0.77 0.80  CALCIUM  --   --  8.7   Liver Function Tests:  Recent Labs Lab 11/24/14 1132  AST 16  ALT 13  ALKPHOS 72  BILITOT 0.7  PROT 6.4  ALBUMIN 3.0*   No results for input(s): LIPASE, AMYLASE in the last 168 hours. No results for input(s): AMMONIA in the last 168 hours. CBC:  Recent Labs Lab 12-03-14 1730 12-03-2014 1744 11/24/14 0057 11/24/14 1132  WBC 9.9  --  11.1* 7.4  NEUTROABS 5.7  --   --   --   HGB 14.2 15.6 14.3 13.5  HCT 41.3 46.0 40.8 39.9  MCV 80.4  --  79.5 80.1  PLT 303  --  258 281   Cardiac Enzymes: No results for input(s): CKTOTAL, CKMB, CKMBINDEX, TROPONINI in the last 168 hours. BNP: BNP (last 3 results) No results for input(s): BNP in the last 8760 hours.  ProBNP (last 3 results) No results for input(s): PROBNP in the last 8760 hours.  CBG:  Recent Labs Lab 11/24/14 1802 11/24/14 2004 11/25/14 0003 11/25/14 0425 11/25/14 0742  GLUCAP 319* 237* 164* 114* 158*    Time coordinating discharge: Over 30 minutes

## 2014-11-25 NOTE — Progress Notes (Signed)
Dressing changed to right lower leg and outer aspect of right little toe per orders. Patient tolerated well. Will monitor.

## 2014-11-25 NOTE — Discharge Instructions (Signed)

## 2014-11-25 NOTE — Care Management Note (Signed)
    Page 1 of 1   11/25/2014     1:16:35 PM CARE MANAGEMENT NOTE 11/25/2014  Patient:  Thomas Wright, Thomas Wright   Account Number:  [id number]  Date Initiated:  11/25/2014  Documentation initiated by:  Tomi Bamberger  Subjective/Objective Assessment:   dx cyst on foot  admit-lives with spouse.     Action/Plan:   Anticipated DC Date:  11/25/2014   Anticipated DC Plan:  HOME/SELF CARE      DC Planning Services  CM consult      Choice offered to / List presented to:             Status of service:  Completed, signed off Medicare Important Message given?  NA - LOS <3 / Initial given by admissions (If response is "NO", the following Medicare IM given date fields will be blank) Date Medicare IM given:   Medicare IM given by:   Date Additional Medicare IM given:   Additional Medicare IM given by:    Discharge Disposition:  HOME/SELF CARE  Per UR Regulation:  Reviewed for med. necessity/level of care/duration of stay  If discussed at [place] of Stay Meetings, dates discussed:    Comments:  11/25/14 [place] [phone] NCM spoke with patient, he can not afford lantus, he will need coupon from DM educator and/or be switched to 70/30, NCM infromed DM educator and RN to notify MD.

## 2014-11-29 ENCOUNTER — Ambulatory Visit: Payer: Commercial Managed Care - HMO | Admitting: Cardiology

## 2014-12-06 DIAGNOSIS — L97919 Non-pressure chronic ulcer of unspecified part of right lower leg with unspecified severity: Secondary | ICD-10-CM | POA: Diagnosis not present

## 2014-12-06 DIAGNOSIS — L97519 Non-pressure chronic ulcer of other part of right foot with unspecified severity: Secondary | ICD-10-CM | POA: Diagnosis not present

## 2014-12-06 DIAGNOSIS — E1165 Type 2 diabetes mellitus with hyperglycemia: Secondary | ICD-10-CM | POA: Diagnosis not present

## 2014-12-14 ENCOUNTER — Other Ambulatory Visit: Payer: Self-pay | Admitting: Cardiology

## 2014-12-20 ENCOUNTER — Encounter: Payer: Self-pay | Admitting: Cardiology

## 2014-12-20 ENCOUNTER — Ambulatory Visit (INDEPENDENT_AMBULATORY_CARE_PROVIDER_SITE_OTHER): Payer: Commercial Managed Care - HMO | Admitting: Cardiology

## 2014-12-20 VITALS — BP 120/82 | HR 62 | Ht 72.0 in | Wt 224.0 lb

## 2014-12-20 DIAGNOSIS — Z79899 Other long term (current) drug therapy: Secondary | ICD-10-CM | POA: Diagnosis not present

## 2014-12-20 DIAGNOSIS — I10 Essential (primary) hypertension: Secondary | ICD-10-CM

## 2014-12-20 DIAGNOSIS — E781 Pure hyperglyceridemia: Secondary | ICD-10-CM

## 2014-12-20 DIAGNOSIS — I481 Persistent atrial fibrillation: Secondary | ICD-10-CM

## 2014-12-20 DIAGNOSIS — Z954 Presence of other heart-valve replacement: Secondary | ICD-10-CM

## 2014-12-20 DIAGNOSIS — I480 Paroxysmal atrial fibrillation: Secondary | ICD-10-CM

## 2014-12-20 DIAGNOSIS — I4819 Other persistent atrial fibrillation: Secondary | ICD-10-CM

## 2014-12-20 NOTE — Patient Instructions (Addendum)
The current medical regimen is effective;  continue present plan and medications.  Follow up in 6 months with Dr. Marlou Porch and for blood work (CBC,CMP and TSH.  You will receive a letter in the mail 2 months before you are due.  Please call us when you receive this letter to schedule your follow up appointment.  Thank you for choosing [facility]!!

## 2014-12-20 NOTE — Progress Notes (Signed)
0     [address], [place]  [address] Phone: [phone] Fax:  [phone]  Date:  12/20/2014   ID:  Thomas Wright, DOB Sep 27, 1945, MRN [id number]  PCP:  [name], MD   History of Present Illness: Thomas Wright is a 69 y.o. male with bioprosthetic aortic valve repair, dilated aortic root hypertension, atrial fibrillation on Eliquis with cardioversion 12/15 on amiodarone,  recent finger infection, partial skin graft, and recent lower extremity wound, cardiomyopathy ejection fraction 25% felt to be tachycardia secondary to resolution of EF here for follow-up.   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 Because of this, a CT angiogram was performed which was reassuring only showing 42 mm.   Aortic valve repair- 2008. Had cardiomyopathy at the time 20% No CAD. EF. Jan 2009 - cardioverted. Current ejection fraction 55%  with aortic root dimension of 4.4 cm.   Diabetes- prior hemoglobin A1c 10.4-uncontrolled. Working closely with Dr. Drema Dallas  Hyperlipidemia-triglycerides 259, LDL 99, likely falsely lowered by elevated triglycerides. Fish oil will improve this. Continue to exercise, diet. I agree that prescription medicine may be needed in the future. His triglycerides will be challenging to control in the setting of uncontrolled diabetes however.   3/16 - hospital cellulitis. His wife showed me pictures of his lower extremity wounds. They're much improved. He knows the gravity of the situation especially with his diabetes and aortic valve.   Wt Readings from Last 3 Encounters:  12/20/14 224 lb (101.606 kg)  11/23/14 220 lb 1.6 oz (99.837 kg)  09/13/14 214 lb 12.8 oz (97.433 kg)     Past Medical History  Diagnosis Date  . Diabetes mellitus   . Hypertension   . Atrial fibrillation   . Hypertriglyceridemia   . Afib     Stopped amiodarone 2013-no further episodes of atrial fibrillation. Postoperative 2008  . H/O  aortic valve replacement with tissue graft     2008.  . Back pain   . Cardiomyopathy     EF originally 20%-now 45%, no CAD on cath    Past Surgical History  Procedure Laterality Date  . Cardiac surgery  05/2007    Aortic Valve Replacement  . I&d extremity Right 05/05/2014    Procedure: IRRIGATION AND DEBRIDEMENT EXTREMITY;  Surgeon: [name], MD;  Location: [place];  Service: Orthopedics;  Laterality: Right;  . Tee without cardioversion N/A 07/23/2014    Procedure: TRANSESOPHAGEAL ECHOCARDIOGRAM (TEE);  Surgeon: Josue Hector, MD;  Location[facility] ENDOSCOPY;  Service: Cardiovascular;  Laterality: N/A;  . Cardioversion N/A 07/23/2014    Procedure: CARDIOVERSION;  Surgeon: Josue Hector, MD;  Location: [facility] ENDOSCOPY;  Service: Cardiovascular;  Laterality: N/A;  . Tee without cardioversion N/A 09/01/2014    Procedure: TRANSESOPHAGEAL ECHOCARDIOGRAM (TEE);  Surgeon: [name], MD;  Location: [facility] ENDOSCOPY;  Service: Cardiovascular;  Laterality: N/A;  . Cardioversion N/A 09/01/2014    Procedure: CARDIOVERSION;  Surgeon: [name], MD;  Location: [facility] ENDOSCOPY;  Service: Cardiovascular;  Laterality: N/A;  . Amputation Right 07/29/2014    Procedure: AMPUTATION RIGHT LONG FINGER;  Surgeon: [name], MD;  Location: [place];  Service: Orthopedics;  Laterality: Right;    Current Outpatient Prescriptions  Medication Sig Dispense Refill  . amiodarone (PACERONE) 200 MG tablet Take 1 tablet (200 mg total) by mouth daily. 90 tablet 0  . aspirin EC 81 MG tablet Take 81 mg by mouth daily.    . carvedilol (COREG) 25  MG tablet Take 0.5 tablets (12.5 mg total) by mouth 2 (two) times daily with a meal. Take 1/2 tablet twice daily    . glipiZIDE (GLUCOTROL) 5 MG tablet Take 5 mg by mouth daily before breakfast.    . insulin glargine (LANTUS) 100 UNIT/ML injection Inject 0.25 mLs (25 Units total) into the skin at bedtime. (Patient taking differently: Inject 15 Units into the skin at bedtime. ) 10 mL  11  . metFORMIN (GLUCOPHAGE) 1000 MG tablet Take 1,000 mg by mouth 2 (two) times daily with a meal.     No current facility-administered medications for this visit.    Allergies:   No Known Allergies  Social History:  The patient  reports that he has never smoked. He has never used smokeless tobacco. He reports that he does not drink alcohol or use illicit drugs.   ROS:  Please see the history of present illness.   Denies any fevers, chills, orthopnea, PND    PHYSICAL EXAM: VS:  BP 120/82 mmHg  Pulse 62  Ht 6' (1.829 m)  Wt 224 lb (101.606 kg)  BMI 30.37 kg/m2 Well nourished, well developed, in no acute distress HEENT: normal Neck: no JVD Cardiac:  normal S1, S2; irregularly irregular; no significant murmur Lungs:  clear to auscultation bilaterally, no wheezing, rhonchi or rales Abd: soft, nontender, no hepatomegaly Ext: no edema Skin: warm and dry, hand/finger amputation. Lower external knee wounds healing  Neuro: no focal abnormalities noted  EKG: 08/18/14-atrial flutter (difficult to see P waves but they are buried within) with variable conduction, heart rate 95 bpm. Left bundle branch block, QTC 510 ms. 12/15/13-sinus rhythm, LVH, prolonged QT 489 ms QTC.   ECHO 07/22/14 - EF 25-30%  (setting of recent tachycardia) ECHO 12/08/13: -  45% to 50%.  ECHO 11/22/14-ejection fraction 60%.   CTA 12/14/13 -  Mild dilatation of the proximal aorta at the sinuses of Valsalva, measuring 4.2 cm in estimated diameter. The rest of the thoracic aorta is of normal caliber. No dissection is identified. Scattered calcified plaque is identified in the distribution of the LAD.   Nuclear stress test 11/2012-no ischemia, ejection fraction 42%  Labs: 08/06/14-sodium 135 (as low as 126 during hospitalization), 4.2 potassium, creatinine 0.63, hemoglobin 11, white count of 12.9, troponin normal, TSH 0.5   ASSESSMENT AND PLAN:  1. Hypotension-resolved. lisinopril, losartan, furosemide were stopped  previously. Carvedilol was decreased to 6.25 mg twice a day, amiodarone continued. 2. Dilated aortic root. CT angiogram was reassuring. This in fact only showed 42 mm. Echocardiogram had been estimating 45-47 mm Sinus Valsalva. We will continue to monitor periodically with echocardiogram. Reassurance. Continue with aggressive pressure control.  3. Aortic valve replacement-doing well. No symptoms. Echocardiogram reassuring. Dental prophylaxis. I stressed my concern over recurrent infections and potential for endocarditis. 4. Hypertension-under good control, in fact previous hypotension. 5. Cardiomyopathy- tachycardia induced with ejection fraction 25-30% in the setting of tachycardia, after cardioversion with amiodarone ejection fraction now normal. 6. Atrial flutter- amiodarone 200 mg twice a day. Cardioversion was successful on 09/01/14. Previous attempt was aborted after left atrial appendage thrombus was noted. He is no longer on anticoagulation, he states that the cost of medication (Eliquis) was too high.  There is an increased risk of stroke if he returns to atrial fibrillation/flutter. He no longer has cardiomyopathy. I discussed with him restarting his anticoagulation. My fear is that stroke could occur. He will look into which medication, Xarelto or Eliquis has lower tier. He will let us  know. 7. Diabetes with peripheral vascular complications-working diligently on this. Stress the importance of this. Hemoglobin A1c greater than 11. 8. 6 months.  Signed, [name], MD [facility]  12/20/2014 9:13 AM

## 2014-12-22 ENCOUNTER — Telehealth: Payer: Self-pay | Admitting: Cardiology

## 2014-12-22 NOTE — Telephone Encounter (Signed)
New message      Pt c/o medication issue:  1. Name of Medication: eliquis and xarelto 2. How are you currently taking this medication (dosage and times per day)?  3. Are you having a reaction (difficulty breathing--STAT)?  4. What is your medication issue? Pt has called ins company regarding starting one of these two medicatins.  He want to talk to the nurse.  He would not give me any info.  Pt is aware Pam is out today---ok to call tomorrow

## 2014-12-22 NOTE — Telephone Encounter (Addendum)
LMTCB. Will forward message to Dr Marlou Porch nurse to address when she returns to the office on 12/24/14.

## 2014-12-22 NOTE — Telephone Encounter (Signed)
The pt called back. He wants to know if Dr Marlou Porch wants him to take Eliquis or Xarelto. Please call the pt back with Dr Marlou Porch decision and send prescription to [facility] per the pts request.

## 2014-12-24 ENCOUNTER — Telehealth: Payer: Self-pay | Admitting: Cardiology

## 2014-12-24 MED ORDER — APIXABAN 5 MG PO TABS: 5.0000 mg | ORAL_TABLET | Freq: Two times a day (BID) | ORAL | Status: DC

## 2014-12-24 NOTE — Telephone Encounter (Signed)
Pt wants to take Eliquis - Rx sent into pharmacy as requested.

## 2014-12-24 NOTE — Telephone Encounter (Signed)
PT RTN YOUR CALL --PLS CALL [phone]

## 2014-12-24 NOTE — Telephone Encounter (Signed)
Left message for pt to call back to discuss whether he wants to start Eliquis 5 mg BID or Xarelto 20 mg QD.  Pt was going to base this on the $ amount of his co-pay thru insurance.

## 2014-12-24 NOTE — Telephone Encounter (Signed)
Left message for pt to c/b 

## 2014-12-24 NOTE — Telephone Encounter (Signed)
[name], MD

## 2014-12-27 ENCOUNTER — Encounter: Payer: Self-pay | Admitting: Endocrinology

## 2014-12-27 ENCOUNTER — Telehealth: Payer: Self-pay

## 2014-12-27 ENCOUNTER — Ambulatory Visit (INDEPENDENT_AMBULATORY_CARE_PROVIDER_SITE_OTHER): Payer: Commercial Managed Care - HMO | Admitting: Endocrinology

## 2014-12-27 VITALS — BP 128/78 | HR 78 | Temp 98.2°F | Ht 72.0 in | Wt 227.4 lb

## 2014-12-27 DIAGNOSIS — E785 Hyperlipidemia, unspecified: Secondary | ICD-10-CM | POA: Diagnosis not present

## 2014-12-27 DIAGNOSIS — R809 Proteinuria, unspecified: Secondary | ICD-10-CM

## 2014-12-27 DIAGNOSIS — G629 Polyneuropathy, unspecified: Secondary | ICD-10-CM

## 2014-12-27 DIAGNOSIS — E1165 Type 2 diabetes mellitus with hyperglycemia: Secondary | ICD-10-CM

## 2014-12-27 DIAGNOSIS — E1142 Type 2 diabetes mellitus with diabetic polyneuropathy: Secondary | ICD-10-CM | POA: Diagnosis not present

## 2014-12-27 DIAGNOSIS — IMO0002 Reserved for concepts with insufficient information to code with codable children: Secondary | ICD-10-CM

## 2014-12-27 DIAGNOSIS — L97509 Non-pressure chronic ulcer of other part of unspecified foot with unspecified severity: Secondary | ICD-10-CM

## 2014-12-27 DIAGNOSIS — E11621 Type 2 diabetes mellitus with foot ulcer: Secondary | ICD-10-CM

## 2014-12-27 LAB — GLUCOSE, POCT (MANUAL RESULT ENTRY): POC Glucose: 169 mg/dl — AB (ref 70–99)

## 2014-12-27 NOTE — Patient Instructions (Addendum)
Please check blood sugars at least half the time about 2 hours after any meal and 3 times per week on waking up.  Please bring blood sugar monitor to each visit.  Recommended blood sugar levels about 2 hours after meal is 140-180 and on waking up 90-130  Call sugar results by end of week  Check name of cholesterol Med  Reduce am  Metformin To 500mg 

## 2014-12-27 NOTE — Telephone Encounter (Signed)
Dr. Dwyane Dee requested cumulative lab results from [place] [phone]). Spoke with Edwin Dada and she is faxing over lab results.

## 2014-12-27 NOTE — Progress Notes (Signed)
Patient ID: Thomas Wright, male   DOB: 1945-10-21, 69 y.o.   MRN: [id number]           Reason for Appointment: Consultation for Type 2 Diabetes  Referring physician: Drema Dallas  History of Present Illness:          Diagnosis: Type 2 diabetes mellitus, date of diagnosis:1993        Past history:  He has been on metformin and glipizide for several years with poor control. Records are available only until 2013 and his A1c has been consistently over 10% In 10/2013 his A1c was 14% He has usually not been checking his blood sugar at home  Recent history:  He was recently admitted to the hospital for foot infection and because of significant hyperglycemia was started on Lantus insulin He was discharged home on 25 units.  This was reduced by his PCP but he has gone back up to 25 units for at least a week His blood sugars were over 300 initially after his discharge but are gradually improving and lowest reading was 209 today He subjectively is feeling less thirsty and more energetic However a few days ago at night his blood sugar was 400 and he was feeling confused. Has not checked his blood sugars after meals Blood sugar today in the office several hours after breakfast was 169 He continues to take metformin and glipizide Does not follow any particular diet.  Usually drinking water, Gatorade or diet drinks       Oral hypoglycemic drugs the patient is taking are:   metformin 1 g twice a day, glipizide 5 mg twice a day     Side effects from medications have been: Mild diarrhea from metformin  INSULIN regimen is described as:  LANTUS 25 units  at bedtime Compliance with the medical regimen: improving Hypoglycemia: Never    Glucose monitoring:  done one time a day         Glucometer: Embrace.      Blood Glucose readings by recall recently 200-300 as above  Self-care: The diet that the patient has been following is: tries to limit high-fat foods .     Meals: 3 meals per day. Breakfast is oatmeal  or eggs; some sweet snacks          Exercise:    unable to do much because of leg and back pain        Dietician visit, most recent: ?  2005              Weight history: in 2013 was 306 lbs, lowest weight recently 201  Wt Readings from Last 3 Encounters:  12/27/14 227 lb 6 oz (103.137 kg)  12/20/14 224 lb (101.606 kg)  11/23/14 220 lb 1.6 oz (99.837 kg)    Glycemic control:   Lab Results  Component Value Date   HGBA1C 12.1* 11/24/2014   HGBA1C 11.8* 07/21/2014   HGBA1C 8.2* 05/05/2014   Lab Results  Component Value Date   CREATININE 0.80 11/24/2014   Microalbumin increased in 2015 at 96 Last LDL 126      Medication List       This list is accurate as of: 12/27/14  3:19 PM.  Always use your most recent med list.               Adjustable Lancing Device Misc     amiodarone 200 MG tablet  Commonly known as:  PACERONE  Take 1 tablet (200 mg total) by mouth  daily.     apixaban 5 MG Tabs tablet  Commonly known as:  ELIQUIS  Take 1 tablet (5 mg total) by mouth 2 (two) times daily.     ASSURE COMFORT LANCETS 30G Misc     B-D UF III MINI PEN NEEDLES 31G X 5 MM Misc  Generic drug:  Insulin Pen Needle     carvedilol 25 MG tablet  Commonly known as:  COREG  Take 0.5 tablets (12.5 mg total) by mouth 2 (two) times daily with a meal. Take 1/2 tablet twice daily     EMBRACE BLOOD GLUCOSE MONITOR Devi     EMBRACE BLOOD GLUCOSE TEST test strip  Generic drug:  glucose blood     EMBRACE CONTROL LOW Soln     glipiZIDE 5 MG tablet  Commonly known as:  GLUCOTROL  Take 5 mg by mouth daily before breakfast.     insulin glargine 100 UNIT/ML injection  Commonly known as:  LANTUS  Inject 0.25 mLs (25 Units total) into the skin at bedtime.     metFORMIN 1000 MG tablet  Commonly known as:  GLUCOPHAGE  Take 1,000 mg by mouth 2 (two) times daily with a meal.        Allergies: No Known Allergies  Past Medical History  Diagnosis Date  . Diabetes mellitus   .  Hypertension   . Atrial fibrillation   . Hypertriglyceridemia   . Afib     Stopped amiodarone 2013-no further episodes of atrial fibrillation. Postoperative 2008  . H/O aortic valve replacement with tissue graft     2008.  . Back pain   . Cardiomyopathy     EF originally 20%-now 45%, no CAD on cath    Past Surgical History  Procedure Laterality Date  . Cardiac surgery  05/2007    Aortic Valve Replacement  . I&d extremity Right 05/05/2014    Procedure: IRRIGATION AND DEBRIDEMENT EXTREMITY;  Surgeon: [name], MD;  Location: [place];  Service: Orthopedics;  Laterality: Right;  . Tee without cardioversion N/A 07/23/2014    Procedure: TRANSESOPHAGEAL ECHOCARDIOGRAM (TEE);  Surgeon: Josue Hector, MD;  Location: [facility] ENDOSCOPY;  Service: Cardiovascular;  Laterality: N/A;  . Cardioversion N/A 07/23/2014    Procedure: CARDIOVERSION;  Surgeon: Josue Hector, MD;  Location: [facility] ENDOSCOPY;  Service: Cardiovascular;  Laterality: N/A;  . Tee without cardioversion N/A 09/01/2014    Procedure: TRANSESOPHAGEAL ECHOCARDIOGRAM (TEE);  Surgeon: [name], MD;  Location: [facility] ENDOSCOPY;  Service: Cardiovascular;  Laterality: N/A;  . Cardioversion N/A 09/01/2014    Procedure: CARDIOVERSION;  Surgeon: [name], MD;  Location: [facility] ENDOSCOPY;  Service: Cardiovascular;  Laterality: N/A;  . Amputation Right 07/29/2014    Procedure: AMPUTATION RIGHT LONG FINGER;  Surgeon: [name], MD;  Location: [place];  Service: Orthopedics;  Laterality: Right;    Family History  Problem Relation Age of Onset  . Colon cancer Father   . Diabetes Father   . Liver disease Brother   . Heart murmur Child   . Congenital heart disease Other   . Diabetes Maternal Grandmother   . Heart disease Maternal Grandfather     A Fib    Social History:  reports that he has never smoked. He has never used smokeless tobacco. He reports that he does not drink alcohol or use illicit drugs.    Review of Systems    Lipid  history: LDL records from PCPs office show consistent levels over about 120 although in 2013 LDL was below  90.  Also has mild increase in triglycerides.  No recent labs available      No results found for: CHOL, HDL, LDLCALC, LDLDIRECT, TRIG, CHOLHDL         Constitutional: no recent weight gain/loss.  Fatigue as above is improving with better blood sugar control  Eyes: no history of blurred vision.  Most recent eye exam was 7/15, reportedly normal  ENT: no nasal congestion, difficulty swallowing, no hoarseness   Cardiovascular: no chest pain or tightness on exertion.  No leg swelling.  Hypertension: mild and treated with low-dose Coreg alone.  May have been on losartan previously  Respiratory: no cough/shortness of breath  Gastrointestinal: no constipation, he gets occasional. diarrhea, no nausea or abdominal pain  Musculoskeletal: no muscle/joint aches. Gets back pain, uses Motrin prn.   Urological:   No frequency of urination ; nocturia 2x daily, less since he has had better blood sugars  He has had long-standing erectile dysfunction with significant difficulty achieving and maintaining erection  He has been previously treated with vacuum pump, drugs like Cialis and Viagra without much benefit.  Asking about implant  Skin: no rash or infections  Neurological: no headaches.  Has had some numbness,  sharp pains or tingling in feet but these are better in the last few days   Psychiatric: no depression history  Endocrine: No cold intolerance, recently no fatigue, TSH normal at 0.76 in 2015, done by PCP    Physical Examination:   BP 128/78 mmHg  Pulse 78  Temp(Src) 98.2 F (36.8 C) (Oral)  Ht 6' (1.829 m)  Wt 227 lb 6 oz (103.137 kg)  BMI 30.83 kg/m2  SpO2 98%  GENERAL:         Patient has minimal obesity, large build HEENT:         Eye exam shows normal external appearance. Fundus exam shows no retinopathy. Oral exam shows normal mucosa .  NECK:         General:  Neck  exam shows no lymphadenopathy. Carotids are normal to palpation and no bruit heard. Thyroid is not enlarged and no nodules felt.   LUNGS:         Chest is symmetrical. Lungs are clear to auscultation.Marland Kitchen   HEART:         Heart sounds:  S1 and S2 are normal. No murmurs or clicks heard., no S3 or S4.   ABDOMEN:   There is no distention present. Liver and spleen are not palpable. No other mass or tenderness present.   NEUROLOGICAL:   Vibration sense is  reduced in toes. Ankle jerks are absent bilaterally.          Diabetic foot exam:  he has dressings on his left foot, has a healing ulcer with surrounding callus on the plantar surface of the right foot medially.  Also has healing shallow ulcer on the right lateral lower leg above the ankle with only mild surrounding erythema Plantar surface of the left foot is normal Monofilament sensation nearly normal in the toes but somewhat decreased on the plantar surfaces. Right dorsalis pedis and left posterior tibialis felt, the pulse is not felt MUSCULOSKELETAL:       There is no enlargement or deformity of the joints. Spine is normal to inspection.Marland Kitchen   SKIN:       No rash     EXTREMITIES:     There is 1+ ankle edema.  Foot exam as above    ASSESSMENT:  Diabetes type 2,  uncontrolled    He has had persistently poor control of his diabetes for at least 3 years Since A1c was recently 12% he is likely to be significantly insulin deficient and has failed treatment with metformin and low-dose glipizide Insulin has been correctly started because of his symptomatic hyperglycemia However his blood sugars are still not controlled and fasting reading at home was 209 today Also unclear whether he is having high readings after meals as he is only checking his readings in the mornings Other problems and current management are discussed in detail in history of present illness above  Patient has very little knowledge about diabetes, meal planning, glucose monitoring and  glucose targets He does need higher doses of insulin than the 25 units of Lantus started with blood sugars in the morning  still over 200 Discussed actions of basal insulin, probable need for adding postprandial insulin at least with the main meal of the day and timing of glucose monitoring as well as desired blood sugar ranges.  Explained current A1c level and goals for Q1J  Complications: Peripheral neuropathy, neurotrophic diabetic ulcers, history of microalbuminuria, erectile dysfunction  Hyperlipidemia: His LDL has been consistently over 100 and needs to be on a statin drug.  Not clear if he has any of his previous medication at home and he will check on this; however not clear if he was actually prescribed a statin drug as detailed prescription history is not available from PCP records  Mild hypertension: Currently well controlled However he may need to be on ACE inhibitor because of his long-standing diabetes and prior increase in urine microalbumin level  PLAN:   Increase Lantus to 28 units.  Given him a flow sheet to titrate the dose every 3 days to get morning sugars below 130 at least  He will need to start monitoring blood sugars at least after supper and call readings back to [place] on Friday.  If postprandial readings at least over 200 he will be started on NovoLog 8-10 units before supper at least  Consultation with diabetes nurse educator on the next visit  Start using One Touch or Accu-Chek glucose monitor, which ever is covered by his insurance so that it can be downloaded on his next visit  Consultation with dietitian probably should be done also  Will continue his metformin for now but reduce the morning dose to 500 mg because of his GI intolerance.  On his next prescription he can try metformin ER.  If he is started on mealtime insulin he will be taken off glipizide.  Periodic thyroid functions since he is on amiodarone  Consider starting Lipitor 10 mg daily, if he has  this at home will start taking this  Consider adding 5-10 mg of lisinopril as an ACE inhibitor  Patient Instructions  Please check blood sugars at least half the time about 2 hours after any meal and 3 times per week on waking up.  Please bring blood sugar monitor to each visit.  Recommended blood sugar levels about 2 hours after meal is 140-180 and on waking up 90-130  Call sugar results by end of week  Check name of cholesterol Med  Reduce am  Metformin  To 500mg   Counseling time over 50% of today's 60 minute visit  Lilyrose Tanney 12/27/2014, 3:19 PM   Note: This office note was prepared with Estate agent. Any transcriptional errors that result from this process are unintentional.

## 2014-12-27 NOTE — Progress Notes (Signed)
Pre visit review using our clinic review tool, if applicable. No additional management support is needed unless otherwise documented below in the visit note. 

## 2014-12-27 NOTE — Telephone Encounter (Signed)
Do not need a telephone encounter for this

## 2014-12-31 ENCOUNTER — Telehealth: Payer: Self-pay | Admitting: Endocrinology

## 2014-12-31 NOTE — Telephone Encounter (Signed)
Left message for patient to call back with readings.

## 2014-12-31 NOTE — Telephone Encounter (Signed)
Patient stated that the medicine is working fine, Mon B/S was 209 Today its 112,  He took 30 units of lantus

## 2014-12-31 NOTE — Telephone Encounter (Signed)
I needed to know what his blood sugars are about 2 hours after meals not just in the morning

## 2014-12-31 NOTE — Telephone Encounter (Signed)
Please see below.

## 2015-01-19 ENCOUNTER — Encounter: Payer: Self-pay | Admitting: Endocrinology

## 2015-01-19 ENCOUNTER — Encounter: Payer: Commercial Managed Care - HMO | Admitting: Nutrition

## 2015-01-19 ENCOUNTER — Ambulatory Visit (INDEPENDENT_AMBULATORY_CARE_PROVIDER_SITE_OTHER): Payer: Commercial Managed Care - HMO | Admitting: Endocrinology

## 2015-01-19 ENCOUNTER — Other Ambulatory Visit: Payer: Self-pay | Admitting: *Deleted

## 2015-01-19 VITALS — BP 159/77 | HR 71 | Temp 98.0°F | Resp 16 | Ht 72.0 in | Wt 230.8 lb

## 2015-01-19 DIAGNOSIS — I10 Essential (primary) hypertension: Secondary | ICD-10-CM

## 2015-01-19 DIAGNOSIS — E1165 Type 2 diabetes mellitus with hyperglycemia: Secondary | ICD-10-CM

## 2015-01-19 DIAGNOSIS — IMO0002 Reserved for concepts with insufficient information to code with codable children: Secondary | ICD-10-CM

## 2015-01-19 MED ORDER — IRBESARTAN 150 MG PO TABS: 150.0000 mg | ORAL_TABLET | Freq: Every day | ORAL | Status: DC

## 2015-01-19 MED ORDER — GLUCOSE BLOOD VI STRP: ORAL_STRIP | Status: DC

## 2015-01-19 MED ORDER — ACCU-CHEK FASTCLIX LANCETS MISC: Status: DC

## 2015-01-19 MED ORDER — INSULIN ASPART 100 UNIT/ML FLEXPEN: PEN_INJECTOR | SUBCUTANEOUS | Status: DC

## 2015-01-19 NOTE — Patient Instructions (Addendum)
Lantus 32 units   Novolog 6 units just before supper and go up 1-2 units if 2 hr sugar still over 180  May need this at other meals also if sugars > 180 after those meals  Please check blood sugars at least half the time about 2 hours after any meal and 3 times per week on waking up.  Please bring blood sugar monitor to each visit. Recommended blood sugar levels about 2 hours after meal is 140-180 and on waking up 90-130

## 2015-01-19 NOTE — Progress Notes (Signed)
Patient ID: Thomas Wright, male   DOB: 1945-11-14, 69 y.o.   MRN: [id number]           Reason for Appointment: Follow-up for Type 2 Diabetes  Referring physician: Drema Dallas  History of Present Illness:          Diagnosis: Type 2 diabetes mellitus, date of diagnosis:1993        Past history:  He has been on metformin and glipizide for several years with poor control. Records are available only until 2013 and his A1c has been consistently over 10% In 10/2013 his A1c was 14% He has usually not been checking his blood sugar at home  Recent history:  He was recently seen for initial consultation for poorly controlled diabetes His Lantus was increased to 28 units and he was asked to increase it further if fasting readings were consistently high He was asked to call about his readings after meals to decide on starting mealtime coverage but he did not do so Also did not bring any blood sugar record or glucose meter for review Still using a generic monitor He also has not seen the diabetes nurse educator as directed His weight appears to be gradually increasing He does continue to be taking metformin 1 g twice a day and does not think he has any diarrhea recently       Oral hypoglycemic drugs the patient is taking are:   metformin 1 g twice a day, glipizide 5 mg twice a day     Side effects from medications have been: None recently  INSULIN regimen is described as:  LANTUS 30 units  at bedtime Compliance with the medical regimen: improving Hypoglycemia: Never    Glucose monitoring:  done one time a day         Glucometer: Embrace ?Marland Kitchen      Blood Glucose readings by recall recently fasting range 138-214, mostly below 200  pc supper 274, 178    Self-care: The diet that the patient has been following is: tries to limit high-fat foods .     Meals: 3 meals per day. Breakfast is oatmeal or eggs; some sweet snacks at time         Exercise:    unable to do much because of leg and back pain, walks at  work        Dietician visit, most recent: ?  2005              Weight history: in 2013 was 306 lbs, lowest weight recently 201  Wt Readings from Last 3 Encounters:  01/19/15 230 lb 12.8 oz (104.69 kg)  12/27/14 227 lb 6 oz (103.137 kg)  12/20/14 224 lb (101.606 kg)    Glycemic control:   Lab Results  Component Value Date   HGBA1C 12.1* 11/24/2014   HGBA1C 11.8* 07/21/2014   HGBA1C 8.2* 05/05/2014   Lab Results  Component Value Date   CREATININE 0.80 11/24/2014   Microalbumin increased in 2015 at 96 Last LDL 126      Medication List       This list is accurate as of: 01/19/15 12:49 PM.  Always use your most recent med list.               ACCU-CHEK FASTCLIX LANCETS Misc  Use to check blood sugar 2 times per day dx code E11.9     Adjustable Lancing Device Misc     amiodarone 200 MG tablet  Commonly known as:  PACERONE  Take 1 tablet (200 mg total) by mouth daily.     B-D UF III MINI PEN NEEDLES 31G X 5 MM Misc  Generic drug:  Insulin Pen Needle     carvedilol 25 MG tablet  Commonly known as:  COREG  Take 0.5 tablets (12.5 mg total) by mouth 2 (two) times daily with a meal. Take 1/2 tablet twice daily     EMBRACE BLOOD GLUCOSE MONITOR Devi     EMBRACE CONTROL LOW Soln     glipiZIDE 5 MG tablet  Commonly known as:  GLUCOTROL  Take 5 mg by mouth daily before breakfast.     glucose blood test strip  Commonly known as:  ACCU-CHEK SMARTVIEW  Use as instructed to check blood sugar 2 times per day     insulin aspart 100 UNIT/ML FlexPen  Commonly known as:  NOVOLOG FLEXPEN  Inject 6 units with each meal     insulin glargine 100 UNIT/ML injection  Commonly known as:  LANTUS  Inject 0.25 mLs (25 Units total) into the skin at bedtime.     metFORMIN 1000 MG tablet  Commonly known as:  GLUCOPHAGE  Take 1,000 mg by mouth 2 (two) times daily with a meal.        Allergies: No Known Allergies  Past Medical History  Diagnosis Date  . Diabetes mellitus   .  Hypertension   . Atrial fibrillation   . Hypertriglyceridemia   . Afib     Stopped amiodarone 2013-no further episodes of atrial fibrillation. Postoperative 2008  . H/O aortic valve replacement with tissue graft     2008.  . Back pain   . Cardiomyopathy     EF originally 20%-now 45%, no CAD on cath    Past Surgical History  Procedure Laterality Date  . Cardiac surgery  05/2007    Aortic Valve Replacement  . I&d extremity Right 05/05/2014    Procedure: IRRIGATION AND DEBRIDEMENT EXTREMITY;  Surgeon: [name], MD;  Location: [facility];  Service: Orthopedics;  Laterality: Right;  . Tee without cardioversion N/A 07/23/2014    Procedure: TRANSESOPHAGEAL ECHOCARDIOGRAM (TEE);  Surgeon: Josue Hector, MD;  Location: [facility] ENDOSCOPY;  Service: Cardiovascular;  Laterality: N/A;  . Cardioversion N/A 07/23/2014    Procedure: CARDIOVERSION;  Surgeon: Josue Hector, MD;  Location: [facility] ENDOSCOPY;  Service: Cardiovascular;  Laterality: N/A;  . Tee without cardioversion N/A 09/01/2014    Procedure: TRANSESOPHAGEAL ECHOCARDIOGRAM (TEE);  Surgeon: [name], MD;  Location: [facility] ENDOSCOPY;  Service: Cardiovascular;  Laterality: N/A;  . Cardioversion N/A 09/01/2014    Procedure: CARDIOVERSION;  Surgeon: [name], MD;  Location: [facility] ENDOSCOPY;  Service: Cardiovascular;  Laterality: N/A;  . Amputation Right 07/29/2014    Procedure: AMPUTATION RIGHT LONG FINGER;  Surgeon: [name], MD;  Location: [facility];  Service: Orthopedics;  Laterality: Right;    Family History  Problem Relation Age of Onset  . Colon cancer Father   . Diabetes Father   . Liver disease Brother   . Heart murmur Child   . Congenital heart disease Other   . Diabetes Maternal Grandmother   . Heart disease Maternal Grandfather     A Fib    Social History:  reports that he has never smoked. He has never used smokeless tobacco. He reports that he does not drink alcohol or use illicit drugs.    Review of Systems    Lipid  history: LDL records from PCPs office show consistent levels over about 120 although  in 2013 LDL was below 90.  Also has mild increase in triglycerides.  No recent labs available      No results found for: CHOL, HDL, LDLCALC, LDLDIRECT, TRIG, CHOLHDL         Hypertension: mild and treated with low-dose Coreg alone.  Had been on losartan previously, he thinks that in the hospital lisinopril caused low blood pressure  Complications from diabetes: Peripheral neuropathy, neurotrophic diabetic ulcers, history of microalbuminuria, erectile dysfunction  Neuropathy: Has had some numbness,  sharp pains or tingling    Physical Examination:   BP 159/77 mmHg  Pulse 71  Temp(Src) 98 F (36.7 C)  Resp 16  Ht 6' (1.829 m)  Wt 230 lb 12.8 oz (104.69 kg)  BMI 31.30 kg/m2  SpO2 97%   EXTREMITIES:     There is trace ankle edema. .          Diabetic foot exam: has healing shallow ulcer on the right lateral lower leg  He has a 1-1.5 cm noninfected ulcer with callus formation on the left plantar surface      ASSESSMENT:  Diabetes type 2, uncontrolled    He has had persistently poor control of his diabetes for at least 3 years Since his postprandial readings are appearing higher at least after supper he will be started on mealtime insulin    Hyperlipidemia: His LDL has been consistently over 100 and needs to be on a statin drug.   Mild hypertension: Currently not well controlled and will need to start Avapro 150 mg daily  PLAN:   Discussed principles of mealtime insulin usage including need for controlling postprandial spikes which cannot be controlled with Lantus alone, timing of insulin, adjusting the dose based on two-hour postprandial reading, duration of action and storage of insulin pens  He will start with 6 units of NovoLog or Humalog whichever is covered by his insurance  Increase Lantus by at least 2 units and get fasting blood sugars consistently below 130  More regular follow-up  and bring monitor for download on the next visit  Start using One Touch or Accu-Chek glucose monitor  Discussed timing of blood sugar monitoring and frequency  Consultation with dietitian and nurse educator  He will see a foot doctor for taking care of his 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  Please bring blood sugar monitor to each visit. Recommended blood sugar levels about 2 hours after meal is 140-180 and on waking up 90-130          Counseling time on subjects discussed above is over 50% of today's 25 minute visit  Ruvim Risko 01/19/2015, 12:49 PM   Note: This office note was prepared with Estate agent. Any transcriptional errors that result from this process are unintentional.

## 2015-02-01 ENCOUNTER — Encounter: Payer: Self-pay | Admitting: *Deleted

## 2015-02-01 ENCOUNTER — Encounter: Payer: Commercial Managed Care - HMO | Attending: Endocrinology | Admitting: Nutrition

## 2015-02-01 DIAGNOSIS — E11621 Type 2 diabetes mellitus with foot ulcer: Secondary | ICD-10-CM | POA: Diagnosis not present

## 2015-02-01 DIAGNOSIS — Z794 Long term (current) use of insulin: Secondary | ICD-10-CM | POA: Diagnosis not present

## 2015-02-01 DIAGNOSIS — L97509 Non-pressure chronic ulcer of other part of unspecified foot with unspecified severity: Secondary | ICD-10-CM

## 2015-02-01 DIAGNOSIS — Z713 Dietary counseling and surveillance: Secondary | ICD-10-CM | POA: Diagnosis not present

## 2015-02-01 NOTE — Progress Notes (Signed)
Subjective:     Patient ID: Thomas Wright, male   DOB: [birth year], 69 y.o.   MRN: [id number]  HPI   Review of Systems     Objective:   Physical Exam     Assessment:    Typical Day:  7:30 AM:  Sweetened oatmeal, coffe to drink.  Sometimes has toast or bagel with this.                                        Lantus: 32 units at HS last night.  Had low blood sugar this AM at 5:30, with sweaty and shakey.  Usually taking 25u, but was high at HS 300, so took 32.                        Is not taking Humalog because it is too expensive  ($141.00/month)                        9:30 am  Mid AM snack:  Piece of fruit                        12-1 Lunch: 2 peanu tbutter and jelly sandwiches, an energy bar, apple sauce, and 1-2 pieces of fruit.  With unsweat tea to drink                         7PM: supper:  6ounces protein, 3-4 veg.( 1-2 starchy veg). And 1-2 breads.  Water or unsweet tea to drink  SBGM: FBS: 100-120,  AcL: 106-227,  AcS: 216-300,  HS: variable depending on amount of carbs and snack at supper meal Acitivity:  Active at work from [employer]:    Discussed the idea of limiting the amounts of carbs at each meal to no more than 4 servings.  Discussed what carbs were, and what servings sizes were.  Also discussed the fact that we do not know what was eating to send some of those readings very high.  He will keep tract of what he is eating and try to limit the amounts of carbs at each meal to 4 servings.  He agreed that if he could not keep his blood sugars down, by limiting the carbs, that he would go on Humalog.  He will test his blood sugars acB and acL one day, and then acS and HS the next--alternating days, and write down what he ate, when the readings are over 200.  He will call me Monday to discuss the blood sugar readings.  He was given a Humalog pen to try, on his big meals-at the dose Dr. Dwyane Dee recommended,  and he will test at [facility], to see if the blood sugars are lower.  I am hoping  that he will see that he needs this insulin.  We will discuss the results next Monday. Also stressed the need for better blood sugars, in order to help the healing of his foot ulcer.  He reported good understanding of this.

## 2015-02-02 DIAGNOSIS — M86671 Other chronic osteomyelitis, right ankle and foot: Secondary | ICD-10-CM | POA: Diagnosis not present

## 2015-02-02 DIAGNOSIS — L97501 Non-pressure chronic ulcer of other part of unspecified foot limited to breakdown of skin: Secondary | ICD-10-CM | POA: Diagnosis not present

## 2015-02-02 DIAGNOSIS — E1159 Type 2 diabetes mellitus with other circulatory complications: Secondary | ICD-10-CM | POA: Diagnosis not present

## 2015-02-02 DIAGNOSIS — L97512 Non-pressure chronic ulcer of other part of right foot with fat layer exposed: Secondary | ICD-10-CM | POA: Diagnosis not present

## 2015-02-02 DIAGNOSIS — L97521 Non-pressure chronic ulcer of other part of left foot limited to breakdown of skin: Secondary | ICD-10-CM | POA: Diagnosis not present

## 2015-02-07 DIAGNOSIS — L97512 Non-pressure chronic ulcer of other part of right foot with fat layer exposed: Secondary | ICD-10-CM | POA: Diagnosis not present

## 2015-02-10 DIAGNOSIS — L97512 Non-pressure chronic ulcer of other part of right foot with fat layer exposed: Secondary | ICD-10-CM | POA: Diagnosis not present

## 2015-02-15 DIAGNOSIS — L97512 Non-pressure chronic ulcer of other part of right foot with fat layer exposed: Secondary | ICD-10-CM | POA: Diagnosis not present

## 2015-03-01 DIAGNOSIS — L97512 Non-pressure chronic ulcer of other part of right foot with fat layer exposed: Secondary | ICD-10-CM | POA: Diagnosis not present

## 2015-03-10 DIAGNOSIS — J209 Acute bronchitis, unspecified: Secondary | ICD-10-CM | POA: Diagnosis not present

## 2015-03-15 ENCOUNTER — Other Ambulatory Visit (HOSPITAL_BASED_OUTPATIENT_CLINIC_OR_DEPARTMENT_OTHER): Payer: Self-pay | Admitting: General Surgery

## 2015-03-15 ENCOUNTER — Encounter (HOSPITAL_BASED_OUTPATIENT_CLINIC_OR_DEPARTMENT_OTHER): Payer: Commercial Managed Care - HMO | Attending: General Surgery

## 2015-03-15 DIAGNOSIS — E11621 Type 2 diabetes mellitus with foot ulcer: Secondary | ICD-10-CM | POA: Insufficient documentation

## 2015-03-15 DIAGNOSIS — L97511 Non-pressure chronic ulcer of other part of right foot limited to breakdown of skin: Secondary | ICD-10-CM | POA: Insufficient documentation

## 2015-03-15 DIAGNOSIS — E11622 Type 2 diabetes mellitus with other skin ulcer: Secondary | ICD-10-CM | POA: Insufficient documentation

## 2015-03-15 DIAGNOSIS — Z79899 Other long term (current) drug therapy: Secondary | ICD-10-CM | POA: Insufficient documentation

## 2015-03-15 DIAGNOSIS — L97811 Non-pressure chronic ulcer of other part of right lower leg limited to breakdown of skin: Secondary | ICD-10-CM | POA: Insufficient documentation

## 2015-03-15 DIAGNOSIS — Z952 Presence of prosthetic heart valve: Secondary | ICD-10-CM | POA: Insufficient documentation

## 2015-03-15 DIAGNOSIS — M869 Osteomyelitis, unspecified: Secondary | ICD-10-CM

## 2015-03-15 DIAGNOSIS — E1140 Type 2 diabetes mellitus with diabetic neuropathy, unspecified: Secondary | ICD-10-CM | POA: Insufficient documentation

## 2015-03-15 DIAGNOSIS — L84 Corns and callosities: Secondary | ICD-10-CM | POA: Insufficient documentation

## 2015-03-15 DIAGNOSIS — Z794 Long term (current) use of insulin: Secondary | ICD-10-CM | POA: Diagnosis not present

## 2015-03-22 ENCOUNTER — Encounter (HOSPITAL_BASED_OUTPATIENT_CLINIC_OR_DEPARTMENT_OTHER): Payer: Commercial Managed Care - HMO | Attending: General Surgery

## 2015-03-22 DIAGNOSIS — E11621 Type 2 diabetes mellitus with foot ulcer: Secondary | ICD-10-CM | POA: Insufficient documentation

## 2015-03-22 DIAGNOSIS — E11622 Type 2 diabetes mellitus with other skin ulcer: Secondary | ICD-10-CM | POA: Diagnosis not present

## 2015-03-22 DIAGNOSIS — L97511 Non-pressure chronic ulcer of other part of right foot limited to breakdown of skin: Secondary | ICD-10-CM | POA: Insufficient documentation

## 2015-03-22 DIAGNOSIS — S81801A Unspecified open wound, right lower leg, initial encounter: Secondary | ICD-10-CM | POA: Diagnosis not present

## 2015-03-22 DIAGNOSIS — S91301A Unspecified open wound, right foot, initial encounter: Secondary | ICD-10-CM | POA: Diagnosis not present

## 2015-03-22 DIAGNOSIS — L97911 Non-pressure chronic ulcer of unspecified part of right lower leg limited to breakdown of skin: Secondary | ICD-10-CM | POA: Diagnosis not present

## 2015-03-22 LAB — GLUCOSE, CAPILLARY: GLUCOSE-CAPILLARY: 135 mg/dL — AB (ref 65–99)

## 2015-03-24 ENCOUNTER — Ambulatory Visit (HOSPITAL_COMMUNITY)
Admission: RE | Admit: 2015-03-24 | Discharge: 2015-03-24 | Disposition: A | Discharge status: Home / Self Care | Payer: Commercial Managed Care - HMO | Source: Ambulatory Visit | Attending: General Surgery | Admitting: General Surgery

## 2015-03-24 DIAGNOSIS — M1990 Unspecified osteoarthritis, unspecified site: Secondary | ICD-10-CM | POA: Insufficient documentation

## 2015-03-24 DIAGNOSIS — L97519 Non-pressure chronic ulcer of other part of right foot with unspecified severity: Secondary | ICD-10-CM | POA: Diagnosis not present

## 2015-03-24 DIAGNOSIS — M869 Osteomyelitis, unspecified: Secondary | ICD-10-CM

## 2015-03-29 DIAGNOSIS — E11622 Type 2 diabetes mellitus with other skin ulcer: Secondary | ICD-10-CM | POA: Diagnosis not present

## 2015-03-29 DIAGNOSIS — L97911 Non-pressure chronic ulcer of unspecified part of right lower leg limited to breakdown of skin: Secondary | ICD-10-CM | POA: Diagnosis not present

## 2015-03-29 DIAGNOSIS — E11621 Type 2 diabetes mellitus with foot ulcer: Secondary | ICD-10-CM | POA: Diagnosis not present

## 2015-03-29 DIAGNOSIS — L97511 Non-pressure chronic ulcer of other part of right foot limited to breakdown of skin: Secondary | ICD-10-CM | POA: Diagnosis not present

## 2015-03-29 LAB — GLUCOSE, CAPILLARY: Glucose-Capillary: 252 mg/dL — ABNORMAL HIGH (ref 65–99)

## 2015-03-31 NOTE — Patient Outreach (Signed)
[facility]) Care Management  03/31/2015  Thomas Wright 22-Dec-1945 [id number]   Referral from [facility] Tier 4 list, assigned to [name], RN for patient outreach.  Thomas Wright L. Thomas Wright [facility] Care Management Assistant [phone]

## 2015-04-04 ENCOUNTER — Other Ambulatory Visit: Payer: Self-pay

## 2015-04-04 NOTE — Patient Outreach (Signed)
[facility]) Care Management  04/04/2015  SNYDER COLAVITO 1945/11/24 [id number]   Referral Date:  03/31/15 Referral Source:  [facility] Tier 4 Referral Issue:  DM, CHF, CVD, 1 ED Visit and 1 Admission.  PCP:  Leighton Ruff, MD  Triage outreach call #1.  Patient not reached.  HIPAA compliant voice message left with name and contact #.  RN CM rescheduled for next contact call.   [name], RN, BSN, [facility], CCM  [employer] Management Coordinator [phone] Office [phone] Direct [phone] Cell

## 2015-04-05 DIAGNOSIS — E11622 Type 2 diabetes mellitus with other skin ulcer: Secondary | ICD-10-CM | POA: Diagnosis not present

## 2015-04-05 DIAGNOSIS — L97511 Non-pressure chronic ulcer of other part of right foot limited to breakdown of skin: Secondary | ICD-10-CM | POA: Diagnosis not present

## 2015-04-05 DIAGNOSIS — S91329A Laceration with foreign body, unspecified foot, initial encounter: Secondary | ICD-10-CM | POA: Diagnosis not present

## 2015-04-05 DIAGNOSIS — E11621 Type 2 diabetes mellitus with foot ulcer: Secondary | ICD-10-CM | POA: Diagnosis not present

## 2015-04-05 DIAGNOSIS — L97911 Non-pressure chronic ulcer of unspecified part of right lower leg limited to breakdown of skin: Secondary | ICD-10-CM | POA: Diagnosis not present

## 2015-04-11 ENCOUNTER — Other Ambulatory Visit: Payer: Self-pay

## 2015-04-11 DIAGNOSIS — R05 Cough: Secondary | ICD-10-CM | POA: Diagnosis not present

## 2015-04-11 DIAGNOSIS — Z952 Presence of prosthetic heart valve: Secondary | ICD-10-CM | POA: Diagnosis not present

## 2015-04-11 DIAGNOSIS — T82817D Embolism of cardiac prosthetic devices, implants and grafts, subsequent encounter: Secondary | ICD-10-CM | POA: Diagnosis not present

## 2015-04-11 DIAGNOSIS — I509 Heart failure, unspecified: Secondary | ICD-10-CM | POA: Diagnosis not present

## 2015-04-11 DIAGNOSIS — E119 Type 2 diabetes mellitus without complications: Secondary | ICD-10-CM | POA: Diagnosis not present

## 2015-04-11 DIAGNOSIS — I4891 Unspecified atrial fibrillation: Secondary | ICD-10-CM | POA: Diagnosis not present

## 2015-04-11 DIAGNOSIS — E1165 Type 2 diabetes mellitus with hyperglycemia: Secondary | ICD-10-CM | POA: Diagnosis not present

## 2015-04-11 DIAGNOSIS — R0602 Shortness of breath: Secondary | ICD-10-CM | POA: Diagnosis not present

## 2015-04-11 DIAGNOSIS — I517 Cardiomegaly: Secondary | ICD-10-CM | POA: Diagnosis not present

## 2015-04-11 DIAGNOSIS — I10 Essential (primary) hypertension: Secondary | ICD-10-CM | POA: Diagnosis not present

## 2015-04-11 NOTE — Patient Outreach (Signed)
[facility]) Care Management  04/11/2015  Thomas Wright 01/09/1946 [id number]  Telephonic Care Management Note:   Triage outreach call #2. Patient reached [phone] home/cell   Referral Date: 03/31/15 Referral Source: [facility] Tier 4 Referral Issue: DM, CHF, CVD, 1 ED Visit and 1 Admission.  PCP: Leighton Ruff, MD (No longer active with Dr. Drema Dallas.  Patient states he has transitioned over to Dr. Prescott Parma, [place].   Patient confirms the above but states he is in the process of transitioning to a new Primary MD, Dr. Prescott Parma, [place].  States he has been seen by Berton Lan, PA through the [facility] system.  Last PCP appt 03/2015 and just completed lab work this morning 04/11/15;  (A1C 10.3). States he has a new glucometer.    Falls: 1 - tripped over his dogs leash but denies any mobility issues.   Transportation: yes - self / still driving.  Medications:  <10.   Consent Patient agrees to [place] services (if Dr. Elicia Lamp is in-network with [facility]).   RN CM sent request to [facility] for verification.  Plan  RN CM instructed patient that RN CM will follow-up with patient to provide update on Primary MD verification.  RN CM will refer to [place] RN CM services for DM (A1C 10.3 on 04/11/15), CHF, CVD, 1 ED Visit and 1 Admission (once eligibility for services verified)   [name], RN, BSN, [facility], [place] Management Care Management Coordinator [phone] Office [phone] Direct [phone] Cell

## 2015-04-12 ENCOUNTER — Other Ambulatory Visit: Payer: Self-pay

## 2015-04-12 DIAGNOSIS — E11622 Type 2 diabetes mellitus with other skin ulcer: Secondary | ICD-10-CM | POA: Diagnosis not present

## 2015-04-12 DIAGNOSIS — L97911 Non-pressure chronic ulcer of unspecified part of right lower leg limited to breakdown of skin: Secondary | ICD-10-CM | POA: Diagnosis not present

## 2015-04-12 DIAGNOSIS — L97511 Non-pressure chronic ulcer of other part of right foot limited to breakdown of skin: Secondary | ICD-10-CM | POA: Diagnosis not present

## 2015-04-12 DIAGNOSIS — E11621 Type 2 diabetes mellitus with foot ulcer: Secondary | ICD-10-CM | POA: Diagnosis not present

## 2015-04-12 NOTE — Patient Outreach (Signed)
[place] [facility] [facility]) Care Management  04/12/2015  Thomas Wright [birth year] [id number]  RN CM notified [facility] administrative assistant to close case; ineligible for services.   [name], RN, BSN, [facility], CCM  [employer] Management Coordinator [phone] Office [phone] Direct [phone] Cell

## 2015-04-12 NOTE — Patient Outreach (Signed)
[facility]) Care Management  04/12/2015  Thomas Wright 1946/07/29 [id number]   Outreach call to patient to confirm with patient that New Primary MD, Dr. Prescott Parma is not in net-work with [facility] resulting in patient's ineligibility for [facility] services at this time.   Patient not reached. RN CM left HIPAA compliant voice message with name and contact # requesting call back.   [name], RN, BSN, [facility], CCM  [employer] Management Coordinator [phone] Office [phone] Direct [phone] Cell

## 2015-04-13 ENCOUNTER — Telehealth: Payer: Self-pay | Admitting: Cardiology

## 2015-04-13 NOTE — Patient Outreach (Signed)
[facility]) Care Management  04/13/2015  TENOCH MCCLURE May 04, 1946 [id number]   Notification from [name], RN to close case due to Patient is ineligible for services due to PCP out of network: Dr. Prescott Parma, [place].   Ronnell Freshwater. [place], [place] Management [place] Assistant Phone: [phone] Fax: [phone]

## 2015-04-13 NOTE — Telephone Encounter (Signed)
Called patient.  He has switched PCP to Berdie Ogren at [facility]  He saw her on Monday and has been providing care for his medical problems  He has gained weight.  224 lbs OV 12/2014 and home wight today 252. B/P 123/68 P 72 with abdominal bloating.  No dependent edema  Dr. Jenean Lindau told him that a  problem with Amiodarone is bloating, weight gain.  Her new PCP has done lab work and Chest xray and referral to pulmonologist   She wanted him to contact Dr. Marlou Porch for medication management of the Amiodarone.

## 2015-04-13 NOTE — Telephone Encounter (Signed)
New message     Pt has dry cough since beginning of July, difficulty breathing not shortness of breath and swelling Pt states he has gained 30 pounds since April  Pt states he has gained 12 pounds since first of July  Pt c/o swelling: STAT is pt has developed SOB within 24 hours  1. How long have you been experiencing swelling? Since April   2. Where is the swelling located? Stomach  3.  Are you currently taking a "fluid pill"?No  4.  Are you currently SOB? No  5.  Have you traveled recently?No Please call to discuss

## 2015-04-18 DIAGNOSIS — S91329A Laceration with foreign body, unspecified foot, initial encounter: Secondary | ICD-10-CM | POA: Diagnosis not present

## 2015-04-18 NOTE — Telephone Encounter (Signed)
Please have him come to see me or APP. Weight gain and EF 25%. Concerning for increased fluid/ heart failure. Does not appear to be on lasix. Will need. Please make sure TSH and Free T4 were done at Dr. Cletis Media office and obtain records from other labs as well.  [name], MD

## 2015-04-18 NOTE — Telephone Encounter (Signed)
Pt aware he needs to be seen for CHF.  appt scheduled for 8/2 at 3:30 pm.

## 2015-04-18 NOTE — Progress Notes (Signed)
Cardiology Office Note   Date:  04/19/2015   ID:  Thomas Wright, DOB [birth year], MRN [id number]  PCP:  [name], MD  Cardiologist:  Dr. [name]   Electrophysiologist:  N/a   Chief Complaint  Patient presents with  . Congestive Heart Failure     History of Present Illness: Thomas Wright is a 69 y.o. male with a hx of bioprosthetic AVR in 05/2007 in [place], post op AFib, NICM with EF 30% at time of AVR (improved to 45% in 2013), HTN, HL, DM2.  He had recurrent AFib 08/2014 with LAA clot noted on TEE.  EF was down again at 25%.  He was anticoagulated with Eliquis and placed on Amiodarone.  He ultimately underwent TEE-DCCV 08/2014.  Most recent echo in 11/2014 demonstrated improved LVF with EF 10-17% and mild diastolic dysfunction, AVR ok with mean gradient 12 mmHg, dilated aortic root at 44 mm, PASP 31 mmHg.  Last seen by Dr. [name] 12/2014.  DCM was felt to be tachy induced 2/2 improved EF in NSR.    Called in recently with weight gain and abdominal bloating.  He was added on to my schedule for further evaluation.    He is here today with his wife.  He comes in today with complaints of 2 weeks of worsening DOE, abdominal bloating, NP cough and weight gain.  He is up 20 lbs since May.  He notes constant chest pressure that is worse with activity.  Denies any radiating symptoms.  He notes 2 pillow orthopnea and PND. No LE edema.  He is short of breath with minimal activity.  He is NYHA 3b.  Denies syncope.  He has been going to the wound center for a R foot diabetic ulcer.    Studies/Reports Reviewed Today:  Echo 3/16 Moderate LVH, EF 51-02%, grade 1 diastolic dysfunction, well-seated bioprosthetic AVR (peak and mean gradients 20 and 12 mmHg), dilated aortic root (44 mm), MAC, mild MR, moderate LAE, mild RAE, mild TR, PASP 31 mmHg  Myoview 3/14 EF 42%, no ischemia   Past Medical History  Diagnosis Date  . Diabetes mellitus   . Hypertension   . Atrial  fibrillation   . Hypertriglyceridemia   . Afib     Stopped amiodarone 2013-no further episodes of atrial fibrillation. Postoperative 2008  . H/O aortic valve replacement with tissue graft     2008.  . Back pain   . Cardiomyopathy     EF originally 20%-now 45%, no CAD on cath    Past Surgical History  Procedure Laterality Date  . Cardiac surgery  05/2007    Aortic Valve Replacement  . I&d extremity Right 05/05/2014    Procedure: IRRIGATION AND DEBRIDEMENT EXTREMITY;  Surgeon: [name], MD;  Location: [place];  Service: Orthopedics;  Laterality: Right;  . Tee without cardioversion N/A 07/23/2014    Procedure: TRANSESOPHAGEAL ECHOCARDIOGRAM (TEE);  Surgeon: Josue Hector, MD;  Location: [facility] ENDOSCOPY;  Service: Cardiovascular;  Laterality: N/A;  . Cardioversion N/A 07/23/2014    Procedure: CARDIOVERSION;  Surgeon: Josue Hector, MD;  Location: [facility] ENDOSCOPY;  Service: Cardiovascular;  Laterality: N/A;  . Tee without cardioversion N/A 09/01/2014    Procedure: TRANSESOPHAGEAL ECHOCARDIOGRAM (TEE);  Surgeon: [name], MD;  Location: [facility] ENDOSCOPY;  Service: Cardiovascular;  Laterality: N/A;  . Cardioversion N/A 09/01/2014    Procedure: CARDIOVERSION;  Surgeon: [name], MD;  Location: [facility] ENDOSCOPY;  Service: Cardiovascular;  Laterality: N/A;  . Amputation Right 07/29/2014  Procedure: AMPUTATION RIGHT LONG FINGER;  Surgeon: [name], MD;  Location: [place];  Service: Orthopedics;  Laterality: Right;     No current outpatient prescriptions on file.   No current facility-administered medications for this visit.    Allergies:   Amoxicill-clarithro-omeprazole    Social History:  The patient  reports that he has never smoked. He has never used smokeless tobacco. He reports that he does not drink alcohol or use illicit drugs.   Family History:  The patient's family history includes Colon cancer in his father; Congenital heart disease in his other; Diabetes in his father and  maternal grandmother; Heart disease in his maternal grandfather; Heart murmur in his child; Liver disease in his brother.    ROS:   Please see the history of present illness.   Review of Systems  Constitution: Positive for malaise/fatigue and weight gain.  Cardiovascular: Positive for dyspnea on exertion, orthopnea and paroxysmal nocturnal dyspnea.  Respiratory: Positive for cough and wheezing.   Musculoskeletal: Positive for back pain.  Neurological: Positive for loss of balance.  All other systems reviewed and are negative.     PHYSICAL EXAM: VS:  BP 130/70 mmHg  Pulse 71  Ht 6' (1.829 m)  Wt 251 lb 1.9 oz (113.907 kg)  BMI 34.05 kg/m2  SpO2 98%    Wt Readings from Last 3 Encounters:  04/19/15 251 lb 1.9 oz (113.907 kg)  03/24/15 230 lb (104.327 kg)  01/19/15 230 lb 12.8 oz (104.69 kg)     GEN: Well nourished, well developed, in no acute distress HEENT: normal Neck: minimal elevated JVD,   no masses Cardiac:  Normal S1/S2, RRR; no murmur, no rubs or gallops, no edema   Respiratory:  Faint bibasilar crackles, no wheezing.  GI:  Distended with diffuse tenderness, no hepatomegaly  MS: no deformity or atrophy Skin: warm and dry  Neuro:  CNs II-XII intact, Strength and sensation are intact Psych: Normal affect   EKG:  EKG is ordered today.  It demonstrates:   NSR, HR 71, first-degree AV block (PR 232 ms), LBBB (QRS 126 ms), QTc 489 ms   Recent Labs: 08/03/2014: Magnesium 2.2 11/24/2014: ALT 13; BUN 11; Creatinine, Ser 0.80; Hemoglobin 13.5; Platelets 281; Potassium 4.9; Sodium 134*; TSH 2.225    Lipid Panel No results found for: CHOL, TRIG, HDL, CHOLHDL, VLDL, LDLCALC, LDLDIRECT    ASSESSMENT AND PLAN:  1.  Acute CHF:  He appears to be significantly volume overloaded.  This is likely diastolic CHF. Last echo with normal LVF.  DCM in the past was thought to be from tachycardia.  He is currently in NSR.  He is fairly miserable.  We had a long discussion regarding oral  diuresis at home vs admission for IV diuresis. He prefers to be admitted.  Will admit to Tele and start Lasix 40 mg IV bid.  Follow renal function closely.  Repeat Echo. 2.  Chest Pain:  This is probably from CHF.  Will check Echo. Check serial CEs.  He is no longer on anticoagulation.  O2 sats are normal. Will check a DDimer. If DDimer elevated, get Chest CTA.  LHC in 2008 without significant CAD.  May need to consider nuclear stress test if Troponins negative as he does have significant CRFs.   3.  Paroxysmal Atrial Fibrillation:  Maintaining NSR.  He stopped Eliquis due to cost.  He is only taking ASA. He remains on Amiodarone.  He can decrease this to 200 mg QD. O2 sats ok.  Doubt he is having signs of pulmonary toxicity.  Will get TSH, LFTs in the hospital.  Resume Eliquis 5 mg bid. Will get the case worker to see him.  I will also send my note to Dr. [name] to see if he would rather have patient on Xarelto (patient asked me to confer with Dr. Marlou Porch).   4.  S/p Bioprosthetic AVR:  Check FU echo.  Continue ASA + Eliquis.  Continue SBE prophylaxis.  5.  Dilated Aortic Root:  Repeat echo as noted.  6.  HTN:  Controlled.  7.  Hyperlipidemia:  Continue statin. 8.  Diabetes Mellitus:  Continue current regimen. Cover with SSI.      Medication Changes: Current medicines are reviewed at length with the patient today.  Concerns regarding medicines are as outlined above.  The following changes have been made:   Discontinued Medications   IRBESARTAN (AVAPRO) 150 MG TABLET    Take 1 tablet (150 mg total) by mouth daily.   Modified Medications   No medications on file   New Prescriptions   No medications on file    Labs/ tests ordered today include:   Orders Placed This Encounter  Procedures  . EKG 12-Lead     Disposition:   Admit to [facility] today.     Signed, Versie Starks, MHS 04/19/2015 5:06 PM    [facility] [place], [place], [place]   [address] Phone: [phone]; Fax: [phone]

## 2015-04-19 ENCOUNTER — Encounter: Payer: Self-pay | Admitting: Physician Assistant

## 2015-04-19 ENCOUNTER — Inpatient Hospital Stay (HOSPITAL_COMMUNITY)
Admission: AD | Admit: 2015-04-19 | Discharge: 2015-04-22 | DRG: 292 | Disposition: A | Discharge status: Home / Self Care | Payer: Commercial Managed Care - HMO | Source: Ambulatory Visit | Attending: Interventional Cardiology | Admitting: Interventional Cardiology

## 2015-04-19 ENCOUNTER — Ambulatory Visit (INDEPENDENT_AMBULATORY_CARE_PROVIDER_SITE_OTHER): Payer: Commercial Managed Care - HMO | Admitting: Physician Assistant

## 2015-04-19 ENCOUNTER — Encounter (HOSPITAL_COMMUNITY): Payer: Self-pay | Admitting: General Practice

## 2015-04-19 ENCOUNTER — Encounter (HOSPITAL_BASED_OUTPATIENT_CLINIC_OR_DEPARTMENT_OTHER): Payer: Commercial Managed Care - HMO | Attending: General Surgery

## 2015-04-19 VITALS — BP 130/70 | HR 71 | Ht 72.0 in | Wt 251.1 lb

## 2015-04-19 DIAGNOSIS — R0600 Dyspnea, unspecified: Secondary | ICD-10-CM | POA: Diagnosis not present

## 2015-04-19 DIAGNOSIS — Z6833 Body mass index (BMI) 33.0-33.9, adult: Secondary | ICD-10-CM | POA: Diagnosis not present

## 2015-04-19 DIAGNOSIS — L97211 Non-pressure chronic ulcer of right calf limited to breakdown of skin: Secondary | ICD-10-CM | POA: Diagnosis not present

## 2015-04-19 DIAGNOSIS — Z952 Presence of prosthetic heart valve: Secondary | ICD-10-CM

## 2015-04-19 DIAGNOSIS — L97411 Non-pressure chronic ulcer of right heel and midfoot limited to breakdown of skin: Secondary | ICD-10-CM | POA: Diagnosis not present

## 2015-04-19 DIAGNOSIS — I77810 Thoracic aortic ectasia: Secondary | ICD-10-CM

## 2015-04-19 DIAGNOSIS — E785 Hyperlipidemia, unspecified: Secondary | ICD-10-CM | POA: Diagnosis not present

## 2015-04-19 DIAGNOSIS — M1990 Unspecified osteoarthritis, unspecified site: Secondary | ICD-10-CM | POA: Diagnosis present

## 2015-04-19 DIAGNOSIS — I509 Heart failure, unspecified: Secondary | ICD-10-CM | POA: Diagnosis not present

## 2015-04-19 DIAGNOSIS — Z79899 Other long term (current) drug therapy: Secondary | ICD-10-CM | POA: Diagnosis not present

## 2015-04-19 DIAGNOSIS — I213 ST elevation (STEMI) myocardial infarction of unspecified site: Secondary | ICD-10-CM | POA: Diagnosis not present

## 2015-04-19 DIAGNOSIS — Z794 Long term (current) use of insulin: Secondary | ICD-10-CM | POA: Diagnosis not present

## 2015-04-19 DIAGNOSIS — R0609 Other forms of dyspnea: Secondary | ICD-10-CM | POA: Diagnosis present

## 2015-04-19 DIAGNOSIS — F329 Major depressive disorder, single episode, unspecified: Secondary | ICD-10-CM | POA: Diagnosis present

## 2015-04-19 DIAGNOSIS — R188 Other ascites: Secondary | ICD-10-CM | POA: Diagnosis not present

## 2015-04-19 DIAGNOSIS — Z7901 Long term (current) use of anticoagulants: Secondary | ICD-10-CM | POA: Diagnosis not present

## 2015-04-19 DIAGNOSIS — I10 Essential (primary) hypertension: Secondary | ICD-10-CM

## 2015-04-19 DIAGNOSIS — I480 Paroxysmal atrial fibrillation: Secondary | ICD-10-CM

## 2015-04-19 DIAGNOSIS — E1140 Type 2 diabetes mellitus with diabetic neuropathy, unspecified: Secondary | ICD-10-CM | POA: Insufficient documentation

## 2015-04-19 DIAGNOSIS — I5033 Acute on chronic diastolic (congestive) heart failure: Secondary | ICD-10-CM | POA: Diagnosis not present

## 2015-04-19 DIAGNOSIS — E871 Hypo-osmolality and hyponatremia: Secondary | ICD-10-CM | POA: Diagnosis not present

## 2015-04-19 DIAGNOSIS — I5031 Acute diastolic (congestive) heart failure: Secondary | ICD-10-CM

## 2015-04-19 DIAGNOSIS — Z954 Presence of other heart-valve replacement: Secondary | ICD-10-CM

## 2015-04-19 DIAGNOSIS — I5021 Acute systolic (congestive) heart failure: Secondary | ICD-10-CM | POA: Diagnosis not present

## 2015-04-19 DIAGNOSIS — I428 Other cardiomyopathies: Secondary | ICD-10-CM | POA: Diagnosis present

## 2015-04-19 DIAGNOSIS — E11621 Type 2 diabetes mellitus with foot ulcer: Secondary | ICD-10-CM | POA: Diagnosis present

## 2015-04-19 DIAGNOSIS — M549 Dorsalgia, unspecified: Secondary | ICD-10-CM | POA: Diagnosis present

## 2015-04-19 DIAGNOSIS — Z9114 Patient's other noncompliance with medication regimen: Secondary | ICD-10-CM | POA: Diagnosis present

## 2015-04-19 DIAGNOSIS — E781 Pure hyperglyceridemia: Secondary | ICD-10-CM | POA: Diagnosis present

## 2015-04-19 DIAGNOSIS — R072 Precordial pain: Secondary | ICD-10-CM

## 2015-04-19 DIAGNOSIS — J9811 Atelectasis: Secondary | ICD-10-CM | POA: Diagnosis not present

## 2015-04-19 DIAGNOSIS — E669 Obesity, unspecified: Secondary | ICD-10-CM | POA: Diagnosis present

## 2015-04-19 DIAGNOSIS — L97519 Non-pressure chronic ulcer of other part of right foot with unspecified severity: Secondary | ICD-10-CM | POA: Diagnosis present

## 2015-04-19 DIAGNOSIS — Z89021 Acquired absence of right finger(s): Secondary | ICD-10-CM

## 2015-04-19 DIAGNOSIS — R7989 Other specified abnormal findings of blood chemistry: Secondary | ICD-10-CM

## 2015-04-19 DIAGNOSIS — E119 Type 2 diabetes mellitus without complications: Secondary | ICD-10-CM

## 2015-04-19 HISTORY — DX: Paroxysmal atrial fibrillation: I48.0

## 2015-04-19 HISTORY — DX: Chronic combined systolic (congestive) and diastolic (congestive) heart failure: I50.42

## 2015-04-19 HISTORY — DX: Hyperlipidemia, unspecified: E78.5

## 2015-04-19 HISTORY — DX: Unspecified osteoarthritis, unspecified site: M19.90

## 2015-04-19 HISTORY — DX: Presence of xenogenic heart valve: Z95.3

## 2015-04-19 HISTORY — DX: Type 2 diabetes mellitus without complications: E11.9

## 2015-04-19 LAB — COMPREHENSIVE METABOLIC PANEL
ALK PHOS: 75 U/L (ref 38–126)
ALT: 18 U/L (ref 17–63)
AST: 19 U/L (ref 15–41)
Albumin: 3.6 g/dL (ref 3.5–5.0)
Anion gap: 7 (ref 5–15)
BILIRUBIN TOTAL: 0.5 mg/dL (ref 0.3–1.2)
BUN: 23 mg/dL — ABNORMAL HIGH (ref 6–20)
CALCIUM: 9.1 mg/dL (ref 8.9–10.3)
CHLORIDE: 99 mmol/L — AB (ref 101–111)
CO2: 27 mmol/L (ref 22–32)
Creatinine, Ser: 1.04 mg/dL (ref 0.61–1.24)
GFR calc Af Amer: >60 mL/min (ref >60)
GFR calc non Af Amer: >60 mL/min (ref >60)
Glucose, Bld: 212 mg/dL — ABNORMAL HIGH (ref 65–99)
Potassium: 5.5 mmol/L — ABNORMAL HIGH (ref 3.5–5.1)
Sodium: 133 mmol/L — ABNORMAL LOW (ref 135–145)
Total Protein: 6.9 g/dL (ref 6.5–8.1)

## 2015-04-19 LAB — CBC WITH DIFFERENTIAL/PLATELET
Basophils Absolute: 0 10*3/uL (ref 0.0–0.1)
Basophils Relative: 0 % (ref 0–1)
Eosinophils Absolute: 0.6 10*3/uL (ref 0.0–0.7)
Eosinophils Relative: 5 % (ref 0–5)
HCT: 42.3 % (ref 39.0–52.0)
HEMOGLOBIN: 14.7 g/dL (ref 13.0–17.0)
Lymphocytes Relative: 32 % (ref 12–46)
Lymphs Abs: 3.9 10*3/uL (ref 0.7–4.0)
MCH: 28.3 pg (ref 26.0–34.0)
MCHC: 34.8 g/dL (ref 30.0–36.0)
MCV: 81.5 fL (ref 78.0–100.0)
Monocytes Absolute: 0.9 10*3/uL (ref 0.1–1.0)
Monocytes Relative: 8 % (ref 3–12)
Neutro Abs: 6.5 10*3/uL (ref 1.7–7.7)
Neutrophils Relative %: 55 % (ref 43–77)
Platelets: 304 10*3/uL (ref 150–400)
RBC: 5.19 MIL/uL (ref 4.22–5.81)
RDW: 13 % (ref 11.5–15.5)
WBC: 12 10*3/uL — ABNORMAL HIGH (ref 4.0–10.5)

## 2015-04-19 LAB — PROTIME-INR
INR: 1.09 (ref 0.00–1.49)
Prothrombin Time: 14.3 seconds (ref 11.6–15.2)

## 2015-04-19 LAB — TROPONIN I: Troponin I: <0.03 ng/mL (ref <0.031)

## 2015-04-19 LAB — BRAIN NATRIURETIC PEPTIDE: B Natriuretic Peptide: 65.8 pg/mL (ref 0.0–100.0)

## 2015-04-19 LAB — TSH: TSH: 2.216 u[IU]/mL (ref 0.350–4.500)

## 2015-04-19 LAB — GLUCOSE, CAPILLARY: GLUCOSE-CAPILLARY: 285 mg/dL — AB (ref 65–99)

## 2015-04-19 LAB — APTT: aPTT: 31 seconds (ref 24–37)

## 2015-04-19 LAB — D-DIMER, QUANTITATIVE (NOT AT ARMC): D DIMER QUANT: 0.61 ug{FEU}/mL — AB (ref 0.00–0.48)

## 2015-04-19 MED ORDER — METFORMIN HCL 500 MG PO TABS
1000.0000 mg | ORAL_TABLET | Freq: Two times a day (BID) | ORAL | Status: DC
  Filled 2015-04-19: qty 2

## 2015-04-19 MED ORDER — ASPIRIN EC 81 MG PO TBEC
81.0000 mg | DELAYED_RELEASE_TABLET | Freq: Every day | ORAL | Status: DC
  Administered 2015-04-19 – 2015-04-22 (×4): 81 mg via ORAL
  Filled 2015-04-19 (×4): qty 1

## 2015-04-19 MED ORDER — APIXABAN 5 MG PO TABS
5.0000 mg | ORAL_TABLET | Freq: Two times a day (BID) | ORAL | Status: DC
  Administered 2015-04-19 – 2015-04-22 (×6): 5 mg via ORAL
  Filled 2015-04-19 (×7): qty 1

## 2015-04-19 MED ORDER — GLIPIZIDE 5 MG PO TABS
5.0000 mg | ORAL_TABLET | Freq: Every day | ORAL | Status: DC
  Administered 2015-04-21 – 2015-04-22 (×2): 5 mg via ORAL
  Filled 2015-04-19 (×3): qty 1

## 2015-04-19 MED ORDER — SODIUM CHLORIDE 0.9 % IJ SOLN
3.0000 mL | Freq: Two times a day (BID) | INTRAMUSCULAR | Status: DC
  Administered 2015-04-19 – 2015-04-22 (×6): 3 mL via INTRAVENOUS

## 2015-04-19 MED ORDER — SODIUM CHLORIDE 0.9 % IV SOLN: 250.0000 mL | INTRAVENOUS | Status: DC | PRN

## 2015-04-19 MED ORDER — METFORMIN HCL 500 MG PO TABS
1000.0000 mg | ORAL_TABLET | Freq: Two times a day (BID) | ORAL | Status: DC
  Administered 2015-04-20 – 2015-04-22 (×6): 1000 mg via ORAL
  Filled 2015-04-19 (×7): qty 2

## 2015-04-19 MED ORDER — AMIODARONE HCL 200 MG PO TABS
200.0000 mg | ORAL_TABLET | Freq: Every day | ORAL | Status: DC
  Administered 2015-04-20 – 2015-04-22 (×3): 200 mg via ORAL
  Filled 2015-04-19 (×4): qty 1

## 2015-04-19 MED ORDER — POTASSIUM CHLORIDE CRYS ER 20 MEQ PO TBCR
20.0000 meq | EXTENDED_RELEASE_TABLET | Freq: Two times a day (BID) | ORAL | Status: DC
  Administered 2015-04-20 – 2015-04-21 (×4): 20 meq via ORAL
  Filled 2015-04-19 (×6): qty 1

## 2015-04-19 MED ORDER — POTASSIUM CHLORIDE CRYS ER 20 MEQ PO TBCR
20.0000 meq | EXTENDED_RELEASE_TABLET | Freq: Two times a day (BID) | ORAL | Status: DC
  Filled 2015-04-19: qty 1

## 2015-04-19 MED ORDER — INSULIN ASPART 100 UNIT/ML ~~LOC~~ SOLN
6.0000 [IU] | Freq: Three times a day (TID) | SUBCUTANEOUS | Status: DC
  Administered 2015-04-20 – 2015-04-22 (×6): 6 [IU] via SUBCUTANEOUS

## 2015-04-19 MED ORDER — ATORVASTATIN CALCIUM 10 MG PO TABS
10.0000 mg | ORAL_TABLET | Freq: Every day | ORAL | Status: DC
  Administered 2015-04-20 – 2015-04-22 (×3): 10 mg via ORAL
  Filled 2015-04-19 (×4): qty 1

## 2015-04-19 MED ORDER — INSULIN ASPART 100 UNIT/ML ~~LOC~~ SOLN: 6.0000 [IU] | Freq: Three times a day (TID) | SUBCUTANEOUS | Status: DC

## 2015-04-19 MED ORDER — CARVEDILOL 12.5 MG PO TABS
12.5000 mg | ORAL_TABLET | Freq: Two times a day (BID) | ORAL | Status: DC
  Filled 2015-04-19: qty 1

## 2015-04-19 MED ORDER — INSULIN ASPART 100 UNIT/ML FLEXPEN
6.0000 [IU] | PEN_INJECTOR | Freq: Three times a day (TID) | SUBCUTANEOUS | Status: DC
  Filled 2015-04-19: qty 3

## 2015-04-19 MED ORDER — INSULIN GLARGINE 100 UNIT/ML ~~LOC~~ SOLN
30.0000 [IU] | Freq: Every day | SUBCUTANEOUS | Status: DC
  Administered 2015-04-19 – 2015-04-20 (×2): 30 [IU] via SUBCUTANEOUS
  Filled 2015-04-19 (×4): qty 0.3

## 2015-04-19 MED ORDER — CARVEDILOL 12.5 MG PO TABS
12.5000 mg | ORAL_TABLET | Freq: Two times a day (BID) | ORAL | Status: DC
Start: 2015-04-20 — End: 2015-04-22
  Administered 2015-04-20 – 2015-04-22 (×6): 12.5 mg via ORAL
  Filled 2015-04-19 (×7): qty 1

## 2015-04-19 MED ORDER — ACETAMINOPHEN 325 MG PO TABS
650.0000 mg | ORAL_TABLET | ORAL | Status: DC | PRN
  Administered 2015-04-20: 650 mg via ORAL
  Filled 2015-04-19: qty 2

## 2015-04-19 MED ORDER — FUROSEMIDE 10 MG/ML IJ SOLN
40.0000 mg | Freq: Two times a day (BID) | INTRAMUSCULAR | Status: DC
  Administered 2015-04-19 – 2015-04-21 (×5): 40 mg via INTRAVENOUS
  Filled 2015-04-19 (×9): qty 4

## 2015-04-19 MED ORDER — INSULIN ASPART 100 UNIT/ML ~~LOC~~ SOLN
0.0000 [IU] | Freq: Every day | SUBCUTANEOUS | Status: DC
  Administered 2015-04-19: 3 [IU] via SUBCUTANEOUS

## 2015-04-19 MED ORDER — SODIUM CHLORIDE 0.9 % IJ SOLN: 3.0000 mL | INTRAMUSCULAR | Status: DC | PRN

## 2015-04-19 NOTE — Patient Instructions (Signed)
YOU HAVE BEEN ADMITTED TO [place] TO UNIT 3 EAST

## 2015-04-19 NOTE — Progress Notes (Signed)
Patient also has diabetic foot ulcer on R foot. He has been followed by the wound center at [place]. Will have wound care follow him as well. Richardson Dopp, PA-C   04/19/2015 6:52 PM

## 2015-04-19 NOTE — H&P (Signed)
Cardiology Office Note   Date:  04/19/2015   ID:  Thomas Wright, DOB 10-21-1945, MRN [id number]  PCP:  [name], MD  Cardiologist:  Dr. [name]   Electrophysiologist:  N/a   Chief Complaint  Patient presents with  . Congestive Heart Failure     History of Present Illness: Thomas Wright is a 69 y.o. male with a hx of bioprosthetic AVR in 05/2007 in [place], post op AFib, NICM with EF 30% at time of AVR (improved to 45% in 2013), HTN, HL, DM2.  He had recurrent AFib 08/2014 with LAA clot noted on TEE.  EF was down again at 25%.  He was anticoagulated with Eliquis and placed on Amiodarone.  He ultimately underwent TEE-DCCV 08/2014.  Most recent echo in 11/2014 demonstrated improved LVF with EF 74-12% and mild diastolic dysfunction, AVR ok with mean gradient 12 mmHg, dilated aortic root at 44 mm, PASP 31 mmHg.  Last seen by Dr. [name] 12/2014.  DCM was felt to be tachy induced 2/2 improved EF in NSR.    Called in recently with weight gain and abdominal bloating.  He was added on to my schedule for further evaluation.    He is here today with his wife.  He comes in today with complaints of 2 weeks of worsening DOE, abdominal bloating, NP cough and weight gain.  He is up 20 lbs since May.  He notes constant chest pressure that is worse with activity.  Denies any radiating symptoms.  He notes 2 pillow orthopnea and PND. No LE edema.  He is short of breath with minimal activity.  He is NYHA 3b.  Denies syncope.  He has been going to the wound center for a R foot diabetic ulcer.    Studies/Reports Reviewed Today:  Echo 3/16 Moderate LVH, EF 87-86%, grade 1 diastolic dysfunction, well-seated bioprosthetic AVR (peak and mean gradients 20 and 12 mmHg), dilated aortic root (44 mm), MAC, mild MR, moderate LAE, mild RAE, mild TR, PASP 31 mmHg  Myoview 3/14 EF 42%, no ischemia   Past Medical History  Diagnosis Date  . Diabetes mellitus   . Hypertension   . Atrial  fibrillation   . Hypertriglyceridemia   . Afib     Stopped amiodarone 2013-no further episodes of atrial fibrillation. Postoperative 2008  . H/O aortic valve replacement with tissue graft     2008.  . Back pain   . Cardiomyopathy     EF originally 20%-now 45%, no CAD on cath    Past Surgical History  Procedure Laterality Date  . Cardiac surgery  05/2007    Aortic Valve Replacement  . I&d extremity Right 05/05/2014    Procedure: IRRIGATION AND DEBRIDEMENT EXTREMITY;  Surgeon: [name], MD;  Location: [place];  Service: Orthopedics;  Laterality: Right;  . Tee without cardioversion N/A 07/23/2014    Procedure: TRANSESOPHAGEAL ECHOCARDIOGRAM (TEE);  Surgeon: Josue Hector, MD;  Location[facility] ENDOSCOPY;  Service: Cardiovascular;  Laterality: N/A;  . Cardioversion N/A 07/23/2014    Procedure: CARDIOVERSION;  Surgeon: Josue Hector, MD;  Location: [facility] ENDOSCOPY;  Service: Cardiovascular;  Laterality: N/A;  . Tee without cardioversion N/A 09/01/2014    Procedure: TRANSESOPHAGEAL ECHOCARDIOGRAM (TEE);  Surgeon: [name], MD;  Location: [facility] ENDOSCOPY;  Service: Cardiovascular;  Laterality: N/A;  . Cardioversion N/A 09/01/2014    Procedure: CARDIOVERSION;  Surgeon: [name], MD;  Location: [facility] ENDOSCOPY;  Service: Cardiovascular;  Laterality: N/A;  . Amputation Right 07/29/2014  Procedure: AMPUTATION RIGHT LONG FINGER;  Surgeon: [name], MD;  Location: [place];  Service: Orthopedics;  Laterality: Right;     Current Outpatient Prescriptions  Medication Sig Dispense Refill  . ACCU-CHEK FASTCLIX LANCETS MISC Use to check blood sugar 2 times per day dx code E11.9 102 each 3  . amiodarone (PACERONE) 200 MG tablet Take 1 tablet (200 mg total) by mouth daily. (Patient taking differently: Take 200 mg by mouth daily. Patient taking 200 mg by mouth twice daily) 90 tablet 0  . atorvastatin (LIPITOR) 10 MG tablet Take 10 mg by mouth daily.    . B-D UF III MINI PEN NEEDLES 31G X 5 MM MISC    1  . Blood Glucose Calibration (EMBRACE CONTROL) LOW SOLN     . Blood Glucose Monitoring Suppl (EMBRACE BLOOD GLUCOSE MONITOR) DEVI     . carvedilol (COREG) 25 MG tablet Take 0.5 tablets (12.5 mg total) by mouth 2 (two) times daily with a meal. Take 1/2 tablet twice daily    . COMFORT EZ PEN NEEDLES 31G X 8 MM MISC     . glipiZIDE (GLUCOTROL) 5 MG tablet Take 5 mg by mouth daily before breakfast.    . glucose blood (ACCU-CHEK SMARTVIEW) test strip Use as instructed to check blood sugar 2 times per day 100 each 3  . insulin aspart (NOVOLOG FLEXPEN) 100 UNIT/ML FlexPen Inject 6 units with each meal 15 mL 3  . insulin glargine (LANTUS) 100 UNIT/ML injection Inject 0.25 mLs (25 Units total) into the skin at bedtime. (Patient taking differently: Inject 30 Units into the skin at bedtime. ) 10 mL 11  . Lancet Devices (ADJUSTABLE LANCING DEVICE) MISC     . metFORMIN (GLUCOPHAGE) 1000 MG tablet Take 1,000 mg by mouth 2 (two) times daily with a meal.     No current facility-administered medications for this visit.    Allergies:   Amoxicill-clarithro-omeprazole    Social History:  The patient  reports that he has never smoked. He has never used smokeless tobacco. He reports that he does not drink alcohol or use illicit drugs.   Family History:  The patient's family history includes Colon cancer in his father; Congenital heart disease in his other; Diabetes in his father and maternal grandmother; Heart disease in his maternal grandfather; Heart murmur in his child; Liver disease in his brother.    ROS:   Please see the history of present illness.   Review of Systems  Constitution: Positive for malaise/fatigue and weight gain.  Cardiovascular: Positive for dyspnea on exertion, orthopnea and paroxysmal nocturnal dyspnea.  Respiratory: Positive for cough and wheezing.   Musculoskeletal: Positive for back pain.  Neurological: Positive for loss of balance.  All other systems reviewed and are  negative.     PHYSICAL EXAM: VS:  BP 130/70 mmHg  Pulse 71  Ht 6' (1.829 m)  Wt 251 lb 1.9 oz (113.907 kg)  BMI 34.05 kg/m2  SpO2 98%    Wt Readings from Last 3 Encounters:  04/19/15 251 lb 1.9 oz (113.907 kg)  03/24/15 230 lb (104.327 kg)  01/19/15 230 lb 12.8 oz (104.69 kg)     GEN: Well nourished, well developed, in no acute distress HEENT: normal Neck: minimal elevated JVD,   no masses Cardiac:  Normal S1/S2, RRR; no murmur, no rubs or gallops, no edema   Respiratory:  Faint bibasilar crackles, no wheezing.  GI:  Distended with diffuse tenderness, no hepatomegaly  MS: no deformity or atrophy Skin:  warm and dry  Neuro:  CNs II-XII intact, Strength and sensation are intact Psych: Normal affect   EKG:  EKG is ordered today.  It demonstrates:   NSR, HR 71, first-degree AV block (PR 232 ms), LBBB (QRS 126 ms), QTc 489 ms   Recent Labs: 08/03/2014: Magnesium 2.2 11/24/2014: ALT 13; BUN 11; Creatinine, Ser 0.80; Hemoglobin 13.5; Platelets 281; Potassium 4.9; Sodium 134*; TSH 2.225    Lipid Panel No results found for: CHOL, TRIG, HDL, CHOLHDL, VLDL, LDLCALC, LDLDIRECT    ASSESSMENT AND PLAN:  1.  Acute CHF:  He appears to be significantly volume overloaded.  This is likely diastolic CHF. Last echo with normal LVF.  DCM in the past was thought to be from tachycardia.  He is currently in NSR.  He is fairly miserable.  We had a long discussion regarding oral diuresis at home vs admission for IV diuresis. He prefers to be admitted.  Will admit to Tele and start Lasix 40 mg IV bid.  Follow renal function closely.  Repeat Echo. 2.  Chest Pain:  This is probably from CHF.  Will check Echo. Check serial CEs.  He is no longer on anticoagulation.  O2 sats are normal. Will check a DDimer. If DDimer elevated, get Chest CTA.  LHC in 2008 without significant CAD.  May need to consider nuclear stress test if Troponins negative as he does have significant CRFs.   3.  Paroxysmal Atrial  Fibrillation:  Maintaining NSR.  He stopped Eliquis due to cost.  He is only taking ASA. He remains on Amiodarone.  He can decrease this to 200 mg QD. O2 sats ok.  Doubt he is having signs of pulmonary toxicity.  Will get TSH, LFTs in the hospital.  Resume Eliquis 5 mg bid. Will get the case worker to see him.  I will also send my note to Dr. [name] to see if he would rather have patient on Xarelto (patient asked me to confer with Dr. Marlou Porch).   4.  S/p Bioprosthetic AVR:  Check FU echo.  Continue ASA + Eliquis.  Continue SBE prophylaxis.  5.  Dilated Aortic Root:  Repeat echo as noted.  6.  HTN:  Controlled.  7.  Hyperlipidemia:  Continue statin. 8.  Diabetes Mellitus:  Continue current regimen. Cover with SSI.     Disposition:   Admit to [facility] today.     Signed, Versie Starks, MHS 04/19/2015 4:38 PM    [facility] [place], [place], [place]  [address] Phone: [phone]; Fax: [phone]

## 2015-04-20 ENCOUNTER — Other Ambulatory Visit: Payer: Self-pay

## 2015-04-20 ENCOUNTER — Observation Stay (HOSPITAL_COMMUNITY): Payer: Commercial Managed Care - HMO

## 2015-04-20 ENCOUNTER — Ambulatory Visit (HOSPITAL_COMMUNITY): Payer: Commercial Managed Care - HMO

## 2015-04-20 ENCOUNTER — Inpatient Hospital Stay (HOSPITAL_COMMUNITY): Payer: Commercial Managed Care - HMO

## 2015-04-20 DIAGNOSIS — E669 Obesity, unspecified: Secondary | ICD-10-CM | POA: Diagnosis present

## 2015-04-20 DIAGNOSIS — F329 Major depressive disorder, single episode, unspecified: Secondary | ICD-10-CM | POA: Diagnosis present

## 2015-04-20 DIAGNOSIS — R0609 Other forms of dyspnea: Secondary | ICD-10-CM | POA: Diagnosis present

## 2015-04-20 DIAGNOSIS — I77810 Thoracic aortic ectasia: Secondary | ICD-10-CM | POA: Diagnosis present

## 2015-04-20 DIAGNOSIS — L97519 Non-pressure chronic ulcer of other part of right foot with unspecified severity: Secondary | ICD-10-CM | POA: Diagnosis present

## 2015-04-20 DIAGNOSIS — I10 Essential (primary) hypertension: Secondary | ICD-10-CM | POA: Diagnosis present

## 2015-04-20 DIAGNOSIS — E785 Hyperlipidemia, unspecified: Secondary | ICD-10-CM | POA: Diagnosis present

## 2015-04-20 DIAGNOSIS — Z6833 Body mass index (BMI) 33.0-33.9, adult: Secondary | ICD-10-CM | POA: Diagnosis not present

## 2015-04-20 DIAGNOSIS — I428 Other cardiomyopathies: Secondary | ICD-10-CM | POA: Diagnosis present

## 2015-04-20 DIAGNOSIS — Z9114 Patient's other noncompliance with medication regimen: Secondary | ICD-10-CM | POA: Diagnosis present

## 2015-04-20 DIAGNOSIS — I480 Paroxysmal atrial fibrillation: Secondary | ICD-10-CM | POA: Diagnosis present

## 2015-04-20 DIAGNOSIS — E871 Hypo-osmolality and hyponatremia: Secondary | ICD-10-CM | POA: Diagnosis present

## 2015-04-20 DIAGNOSIS — M549 Dorsalgia, unspecified: Secondary | ICD-10-CM | POA: Diagnosis present

## 2015-04-20 DIAGNOSIS — I213 ST elevation (STEMI) myocardial infarction of unspecified site: Secondary | ICD-10-CM | POA: Diagnosis not present

## 2015-04-20 DIAGNOSIS — I5033 Acute on chronic diastolic (congestive) heart failure: Secondary | ICD-10-CM | POA: Diagnosis present

## 2015-04-20 DIAGNOSIS — R0600 Dyspnea, unspecified: Secondary | ICD-10-CM

## 2015-04-20 DIAGNOSIS — Z952 Presence of prosthetic heart valve: Secondary | ICD-10-CM | POA: Diagnosis not present

## 2015-04-20 DIAGNOSIS — Z794 Long term (current) use of insulin: Secondary | ICD-10-CM | POA: Diagnosis not present

## 2015-04-20 DIAGNOSIS — E11621 Type 2 diabetes mellitus with foot ulcer: Secondary | ICD-10-CM | POA: Diagnosis present

## 2015-04-20 DIAGNOSIS — I5031 Acute diastolic (congestive) heart failure: Secondary | ICD-10-CM | POA: Diagnosis present

## 2015-04-20 DIAGNOSIS — M1990 Unspecified osteoarthritis, unspecified site: Secondary | ICD-10-CM | POA: Diagnosis present

## 2015-04-20 DIAGNOSIS — Z89021 Acquired absence of right finger(s): Secondary | ICD-10-CM | POA: Diagnosis not present

## 2015-04-20 DIAGNOSIS — I5021 Acute systolic (congestive) heart failure: Secondary | ICD-10-CM | POA: Diagnosis not present

## 2015-04-20 DIAGNOSIS — Z79899 Other long term (current) drug therapy: Secondary | ICD-10-CM | POA: Diagnosis not present

## 2015-04-20 DIAGNOSIS — E781 Pure hyperglyceridemia: Secondary | ICD-10-CM | POA: Diagnosis present

## 2015-04-20 LAB — GLUCOSE, CAPILLARY
GLUCOSE-CAPILLARY: 189 mg/dL — AB (ref 65–99)
Glucose-Capillary: 151 mg/dL — ABNORMAL HIGH (ref 65–99)
Glucose-Capillary: 203 mg/dL — ABNORMAL HIGH (ref 65–99)
Glucose-Capillary: 203 mg/dL — ABNORMAL HIGH (ref 65–99)

## 2015-04-20 LAB — BASIC METABOLIC PANEL
Anion gap: 7 (ref 5–15)
BUN: 20 mg/dL (ref 6–20)
CALCIUM: 8.8 mg/dL — AB (ref 8.9–10.3)
CO2: 25 mmol/L (ref 22–32)
CREATININE: 0.87 mg/dL (ref 0.61–1.24)
Chloride: 102 mmol/L (ref 101–111)
GFR calc Af Amer: >60 mL/min (ref >60)
GFR calc non Af Amer: >60 mL/min (ref >60)
Glucose, Bld: 209 mg/dL — ABNORMAL HIGH (ref 65–99)
POTASSIUM: 4.2 mmol/L (ref 3.5–5.1)
SODIUM: 134 mmol/L — AB (ref 135–145)

## 2015-04-20 LAB — TROPONIN I

## 2015-04-20 MED ORDER — ONDANSETRON HCL 4 MG PO TABS
4.0000 mg | ORAL_TABLET | Freq: Three times a day (TID) | ORAL | Status: DC | PRN
  Administered 2015-04-21: 4 mg via ORAL
  Filled 2015-04-20: qty 1

## 2015-04-20 MED ORDER — ONDANSETRON HCL 4 MG/2ML IJ SOLN
4.0000 mg | Freq: Three times a day (TID) | INTRAMUSCULAR | Status: DC | PRN
  Administered 2015-04-21 – 2015-04-22 (×2): 4 mg via INTRAVENOUS
  Filled 2015-04-20 (×2): qty 2

## 2015-04-20 MED ORDER — IOHEXOL 350 MG/ML SOLN
100.0000 mL | Freq: Once | INTRAVENOUS | Status: AC | PRN
Start: 2015-04-20 — End: 2015-04-20
  Administered 2015-04-20: 100 mL via INTRAVENOUS

## 2015-04-20 MED ORDER — OXYCODONE-ACETAMINOPHEN 5-325 MG PO TABS
1.0000 | ORAL_TABLET | Freq: Once | ORAL | Status: AC
  Administered 2015-04-21: 1 via ORAL
  Filled 2015-04-20: qty 1

## 2015-04-20 NOTE — Progress Notes (Signed)
CT angio negative for PE.  Thomas Wright, PAC

## 2015-04-20 NOTE — Discharge Instructions (Signed)

## 2015-04-20 NOTE — Consult Note (Signed)
WOC wound consult note Reason for Consult: right foot wound followed by the wound care center. Pt reports he is followed by Dr. Lindon Romp at the [facility] wound care center.  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 He additionally has an areas on the right posterior ankle area that her reports is from a scratch that is non healing. Both wounds have been treated with enzymatic debridement ointment previously. Wound type:Neuropathic Measurement: right posterior ankle area: 2.0cm x 0.5cm x 0.2cm; right plantar surface 1.5cm x 2.0cm x 0.1cm  Wound bed: right posterior ankle-80% yellow/20% pink; right plantar surface-100% pink, non granular, ruddy Drainage (amount, consistency, odor) minimal, no odor, serosanguinous  Periwound: intact, the area on the right lateral ankle is mostly healed, small scab noted.  The periwound of the plantar surface ulcer is hyperkeratotic, but per the patient it is trimmed regularly by the wound care center provider.  Dressing procedure/placement/frequency: Continue silicone foam over the ankle wounds, add silver hydrofiber to the plantar surface for antimicrobial effects.  Will not restart enzymatic debridement ointment at this time, sites are clean mostly and the right medial ankle wound is very loose and can probably be cleared away with cleansing.  Will leave foam offloading padding around the plantar foot wound, as we do not have here in the hospital and it is beneficial in order to offoad the ulcer when the patient is up to the bathroom or otherwise.  Should be NWB as much as possible on the right foot.  Discussed POC with patient and bedside nurse.  Re consult if needed, will not follow at this time. Thanks  Thomas Wright, [place] [phone])

## 2015-04-20 NOTE — Progress Notes (Signed)
  Echocardiogram 2D Echocardiogram has been performed.  Thomas Wright 04/20/2015, 5:04 PM

## 2015-04-20 NOTE — Progress Notes (Signed)
Patient Profile: 69 y.o. male with a hx of bioprosthetic AVR in 05/2007 in [place], post op AFib, NICM with EF 30% at time of AVR (improved to 45% in 2013), HTN, HL, DM2. He had recurrent AFib 08/2014 with LAA clot noted on TEE. EF was down again at 25%. He was anticoagulated with Eliquis and placed on Amiodarone. He ultimately underwent TEE-DCCV 08/2014. Most recent echo in 11/2014 demonstrated improved LVF with EF 18-29% and mild diastolic dysfunction, AVR ok with mean gradient 12 mmHg, dilated aortic root at 44 mm, PASP 31 mmHg. DCM was felt to be tachy induced 2/2 improved EF in NSR. Patient ultimately self discontinued Eliquis due to cost.   Subjective: No significant dyspnea at rest. Some mild chest discomfort + cough when he takes in a deep breath.   Objective: Vital signs in last 24 hours: Temp:  [98 F (36.7 C)-98.2 F (36.8 C)] 98 F (36.7 C) (08/03 0533) Pulse Rate:  [70-78] 78 (08/03 0818) Resp:  [18] 18 (08/03 0533) BP: (130-149)/(56-76) 149/72 mmHg (08/03 0818) SpO2:  [98 %-100 %] 99 % (08/03 0533) Weight:  [245 lb 9.6 oz (111.403 kg)-251 lb 1.9 oz (113.907 kg)] 245 lb 9.6 oz (111.403 kg) (08/03 0533) Last BM Date: 04/19/15  Intake/Output from previous day: 08/02 0701 - 08/03 0700 In: 940 [P.O.:940] Out: 2975 [Urine:2975] Intake/Output this shift: Total I/O In: 480 [P.O.:480] Out: 900 [Urine:900]  Medications Current Facility-Administered Medications  Medication Dose Route Frequency Provider Last Rate Last Dose  . 0.9 %  sodium chloride infusion  250 mL Intravenous PRN Liliane Shi, PA-C      . acetaminophen (TYLENOL) tablet 650 mg  650 mg Oral Q4H PRN Liliane Shi, PA-C      . amiodarone (PACERONE) tablet 200 mg  200 mg Oral Daily Liliane Shi, PA-C   200 mg at 04/19/15 2000  . apixaban (ELIQUIS) tablet 5 mg  5 mg Oral BID Liliane Shi, PA-C   5 mg at 04/19/15 2212  . aspirin EC tablet 81 mg  81 mg Oral Daily Liliane Shi, PA-C   81 mg at  04/19/15 2210  . atorvastatin (LIPITOR) tablet 10 mg  10 mg Oral Daily Liliane Shi, PA-C   10 mg at 04/19/15 2000  . carvedilol (COREG) tablet 12.5 mg  12.5 mg Oral BID WC Belva Crome, MD   12.5 mg at 04/20/15 0819  . furosemide (LASIX) injection 40 mg  40 mg Intravenous BID Liliane Shi, PA-C   40 mg at 04/20/15 9371  . [START ON 04/21/2015] glipiZIDE (GLUCOTROL) tablet 5 mg  5 mg Oral QAC breakfast Liliane Shi, PA-C      . insulin aspart (novoLOG) injection 0-5 Units  0-5 Units Subcutaneous QHS Liliane Shi, PA-C   3 Units at 04/19/15 2213  . insulin aspart (novoLOG) injection 6 Units  6 Units Subcutaneous TID WC Belva Crome, MD   6 Units at 04/20/15 0820  . insulin glargine (LANTUS) injection 30 Units  30 Units Subcutaneous QHS Liliane Shi, PA-C   30 Units at 04/19/15 2213  . metFORMIN (GLUCOPHAGE) tablet 1,000 mg  1,000 mg Oral BID WC Belva Crome, MD   1,000 mg at 04/20/15 0819  . potassium chloride SA (K-DUR,KLOR-CON) CR tablet 20 mEq  20 mEq Oral BID Wynell Balloon, RPH      . sodium chloride 0.9 % injection 3 mL  3 mL Intravenous Q12H Scott T  Weaver, PA-C   3 mL at 04/19/15 2213  . sodium chloride 0.9 % injection 3 mL  3 mL Intravenous PRN Liliane Shi, PA-C        PE: General appearance: alert, cooperative and no distress Neck: no carotid bruit and no JVD Lungs: clear to auscultation bilaterally Heart: regular rate and rhythm, S1, S2 normal, no murmur, click, rub or gallop Extremities: no LEE Pulses: 2+ and symmetric Skin: warm and dry Neurologic: Grossly normal  Lab Results:   Recent Labs  04/19/15 1828  WBC 12.0*  HGB 14.7  HCT 42.3  PLT 304   BMET  Recent Labs  04/19/15 1828 04/20/15 0515  NA 133* 134*  K 5.5* 4.2  CL 99* 102  CO2 27 25  GLUCOSE 212* 209*  BUN 23* 20  CREATININE 1.04 0.87  CALCIUM 9.1 8.8*   PT/INR  Recent Labs  04/19/15 1828  LABPROT 14.3  INR 1.09   Cardiac Panel (last 3 results)  Recent Labs   04/19/15 1828 04/19/15 2342 04/20/15 0515  TROPONINI <0.03 <0.03 <0.03   BNP    Component Value Date/Time   BNP 65.8 04/19/2015 1828    Studies/Results: CXR 04/20/15 FINDINGS: There is chronic elevation of the right hemidiaphragm. The lungs are adequately inflated and clear. There is no pneumothorax or pleural effusion. The heart is normal in size. There is a prosthetic aortic valve. The pulmonary vascularity is not engorged. There are 8 intact sternal wires. The trachea is midline. The bony thorax is unremarkable.  IMPRESSION: There is no evidence of CHF, pneumonia, nor other acute cardiopulmonary abnormality.   Assessment/Plan  Active Problems:   Dyspnea   1. DOE:  BNP normal at 65.8. CXR shows no evidence of CHF. Euvolemic on exam. Troponin negative x 3. Patient has been noncompliant with Eliquis and now with elevated D-dimer at 0.61. Will get CT angio to rule out PE. Renal function is stable with SCr at 0.87. Slight leukocytosis but afebrile. He is hemodynamically stable. 2D echo pending.    LOS: 1 day    Brittainy M. Ladoris Gene 04/20/2015 10:53 AM  Personally seen and examined. Agree with above. Awaiting CT scan. [place] will show no evidence of PE Good diuresis Feels better Asked about disability  [name], MD

## 2015-04-21 DIAGNOSIS — I5021 Acute systolic (congestive) heart failure: Secondary | ICD-10-CM

## 2015-04-21 LAB — GLUCOSE, CAPILLARY
GLUCOSE-CAPILLARY: 165 mg/dL — AB (ref 65–99)
Glucose-Capillary: 121 mg/dL — ABNORMAL HIGH (ref 65–99)
Glucose-Capillary: 101 mg/dL — ABNORMAL HIGH (ref 65–99)
Glucose-Capillary: 210 mg/dL — ABNORMAL HIGH (ref 65–99)
Glucose-Capillary: 113 mg/dL — ABNORMAL HIGH (ref 65–99)

## 2015-04-21 LAB — BASIC METABOLIC PANEL
ANION GAP: 7 (ref 5–15)
BUN: 25 mg/dL — ABNORMAL HIGH (ref 6–20)
CO2: 27 mmol/L (ref 22–32)
CREATININE: 1 mg/dL (ref 0.61–1.24)
Calcium: 8.7 mg/dL — ABNORMAL LOW (ref 8.9–10.3)
Chloride: 98 mmol/L — ABNORMAL LOW (ref 101–111)
GFR calc non Af Amer: >60 mL/min (ref >60)
GLUCOSE: 191 mg/dL — AB (ref 65–99)
Potassium: 4.6 mmol/L (ref 3.5–5.1)
SODIUM: 132 mmol/L — AB (ref 135–145)

## 2015-04-21 MED ORDER — OXYCODONE-ACETAMINOPHEN 5-325 MG PO TABS
1.0000 | ORAL_TABLET | Freq: Once | ORAL | Status: AC
  Administered 2015-04-21: 1 via ORAL
  Filled 2015-04-21: qty 1

## 2015-04-21 NOTE — Progress Notes (Signed)
CM CONSULT  Patient is having financial difficulties affording Eliquis. Patient has private insurance with [facility] Medicare with prescription drug coverage. Patient stated that his co pay is $45.00 a month.Patient stated  that he is on a fixed income, receiving $1,090 a month and $45 for one prescription is a lot. Patient stated that he used the coupon card to receive a free 30 day  medication supply of Eliquis. Application given to patient to complete for the patient assistance program through [facility]/ Eliquis to see if he will qualify for their program. CM instructed the patient to complete the application and mail if off. He will need to fill his prescription until he is approved for the medication assistance program. Aneta Mins [phone]

## 2015-04-21 NOTE — Progress Notes (Signed)
Patient: Thomas Wright / Admit Date: 04/19/2015 / Date of Encounter: 04/21/2015, 9:39 AM   Subjective: Steadily feeling better but not quite there yet. Belly bloating is improving. SOB improving but not yet back to baseline. Denies CP.    Objective: Telemetry: NSR occasional PVCs Physical Exam: Blood pressure 135/68, pulse 77, temperature 98.1 F (36.7 C), temperature source Oral, resp. rate 18, height 6' (1.829 m), weight 245 lb 8 oz (111.358 kg), SpO2 100 %. General: Well developed, well nourished WM, in no acute distress. Head: Normocephalic, atraumatic, sclera non-icteric, no xanthomas, nares are without discharge. Neck: JVP not elevated. Lungs: Clear bilaterally to auscultation without wheezes, rales, or rhonchi. Breathing is unlabored. Heart: RRR S1 S2 without murmurs, rubs, or gallops.  Abdomen: Soft, rounded, with normoactive bowel sounds. No rebound/guarding. Extremities: No clubbing or cyanosis. No edema. Distal pedal pulses are 2+ and equal bilaterally. Neuro: Alert and oriented X 3. Moves all extremities spontaneously. Psych:  Responds to questions appropriately with a normal affect.   Intake/Output Summary (Last 24 hours) at 04/21/15 0939 Last data filed at 04/21/15 2025  Gross per 24 hour  Intake    580 ml  Output   2925 ml  Net  -2345 ml    Inpatient Medications:  . amiodarone  200 mg Oral Daily  . apixaban  5 mg Oral BID  . aspirin EC  81 mg Oral Daily  . atorvastatin  10 mg Oral Daily  . carvedilol  12.5 mg Oral BID WC  . furosemide  40 mg Intravenous BID  . glipiZIDE  5 mg Oral QAC breakfast  . insulin aspart  0-5 Units Subcutaneous QHS  . insulin aspart  6 Units Subcutaneous TID WC  . insulin glargine  30 Units Subcutaneous QHS  . metFORMIN  1,000 mg Oral BID WC  . potassium chloride  20 mEq Oral BID  . sodium chloride  3 mL Intravenous Q12H   Infusions:    Labs:  Recent Labs  04/20/15 0515 04/21/15 0315  NA 134* 132*  K 4.2 4.6  CL 102 98*  CO2  25 27  GLUCOSE 209* 191*  BUN 20 25*  CREATININE 0.87 1.00  CALCIUM 8.8* 8.7*    Recent Labs  04/19/15 1828  AST 19  ALT 18  ALKPHOS 75  BILITOT 0.5  PROT 6.9  ALBUMIN 3.6    Recent Labs  04/19/15 1828  WBC 12.0*  NEUTROABS 6.5  HGB 14.7  HCT 42.3  MCV 81.5  PLT 304    Recent Labs  04/19/15 1828 04/19/15 2342 04/20/15 0515  TROPONINI <0.03 <0.03 <0.03   Invalid input(s): POCBNP No results for input(s): HGBA1C in the last 72 hours.   Radiology/Studies:  Dg Chest 2 View  04/20/2015   CLINICAL DATA:  Acute CHF  EXAM: CHEST  2 VIEW  COMPARISON:  PA and lateral chest x-ray of August 04, 2014  FINDINGS: There is chronic elevation of the right hemidiaphragm. The lungs are adequately inflated and clear. There is no pneumothorax or pleural effusion. The heart is normal in size. There is a prosthetic aortic valve. The pulmonary vascularity is not engorged. There are 8 intact sternal wires. The trachea is midline. The bony thorax is unremarkable.  IMPRESSION: There is no evidence of CHF, pneumonia, nor other acute cardiopulmonary abnormality.   Electronically Signed   By: David  Martinique M.D.   On: 04/20/2015 07:15   Ct Angio Chest Pe W/cm &/or Wo Cm  04/20/2015   CLINICAL DATA:  Short of breath.  Elevated D-dimer  EXAM: CT ANGIOGRAPHY CHEST WITH CONTRAST  TECHNIQUE: Multidetector CT imaging of the chest was performed using the standard protocol during bolus administration of intravenous contrast. Multiplanar CT image reconstructions and MIPs were obtained to evaluate the vascular anatomy.  CONTRAST:  138mL OMNIPAQUE IOHEXOL 350 MG/ML SOLN  COMPARISON:  Chest x-ray 04/20/2015  FINDINGS: Excellent pulmonary artery opacification. Negative for pulmonary emboli. Pulmonary arteries normal in caliber.  Left ventricular enlargement with left ventricular hypertrophy. Aortic valve replacement. Thoracic aorta is not significantly opacified but is non aneurysmal.  Mild atelectasis right lower  lobe. Negative for pneumonia or effusion.  Negative for mass or adenopathy.  Cholelithiasis. No mass in the upper abdomen. Splenic calcifications.  Review of the MIP images confirms the above findings.  IMPRESSION: Negative for pulmonary emboli.  Left ventricular enlargement with left ventricular hypertrophy. Aortic valve replacement.  Cholelithiasis.   Electronically Signed   By: Franchot Gallo M.D.   On: 04/20/2015 16:05   Mr Foot Right Wo Contrast  03/24/2015   CLINICAL DATA:  Soft tissue ulceration on the plantar aspect of the right foot at the little toe.  EXAM: MRI OF THE RIGHT FOREFOOT WITHOUT CONTRAST  TECHNIQUE: Multiplanar, multisequence MR imaging was performed. No intravenous contrast was administered.  COMPARISON:  Radiographs dated 11/23/2014  FINDINGS: There is a soft tissue ulceration on the plantar aspect of the ball of the foot superficial to the head of the fifth metatarsal. There is no osteomyelitis or joint effusion. There is no deep abscess. The bones of the forefoot appear normal except for minimal arthritis at the first metatarsal phalangeal joint. The muscles and tendons appear normal.  IMPRESSION: Soft tissue ulceration on the ball of the foot with no deep extension. No osteomyelitis or abscess. No septic joint.   Electronically Signed   By: Lorriane Shire M.D.   On: 03/24/2015 21:33     Assessment and Plan  69 y/o M with history of bioprosthetic AVR in 05/2007 in [place], post op AFib, NICM with EF 30% at time of AVR (improved to 45% in 2013), HTN, HL, DM2.Had recurrent AFib 08/2014 with LAA clot noted on TEE.EF was down again at 25%. He was anticoagulated with Eliquis and placed on Amiodarone.Ultimately underwent TEE-DCCV 08/2014. 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 Readmitted 04/19/15 with SOB.  1. Dyspnea - clinically he is behaving like acute on chronic diastolic CHF with 2 weeks of worsening dyspnea, bloating, +20lb weight gain since May. Curiously BNP was normal and CXR showed no evidence of CHF. CTA negative for PE. He does feel some of this weight is due to eating habits but does not think that's the whole picture - used to cook for himself and now someone cooks for him with less healthy options including ++salt. Weight down 5lbs and clinically improving with diuresis. Will discuss with Dr. Marlou Porch - may need to investigate other avenues simultaneously - ?nuc (given CP on admission) versus PFTs (amiodarone use + h/o coal mining for 4 years).  2. Chest pain - felt 2/2 #1. Troponins negative. CTA neg for PE. H/o normal cors 2008. Troponins neg x 3. May need to consider stress testing given continued dyspnea on exertion.  3. Paroxysmal atrial fibrillation - maintaining NSR on amiodarone.  At some point will need baseline PFTs while on amiodarone. This dose has been reduced this admission. Care management consult placed 8/2 to assess affordability of anticoagulation since patient could not afford Eliquis. I asked nurse to please contact today since I do not see their notes yet.  4. 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  5. HTN - controlled.  Signed, Melina Copa PA-C Pager: [phone]   Personally seen and examined. Agree with above. Dyspnea is improving EF normal AVR normal Continue diuresis Will check PFT's once further diuresis. CT normal no PE, no evidence of interstitial dz (amio).  Working with case mgt expenses of eliquis.   [name], MD

## 2015-04-21 NOTE — Progress Notes (Signed)
Patient's right bottom foot wound dressing changed. Patient tolerated it well.

## 2015-04-22 ENCOUNTER — Encounter (HOSPITAL_COMMUNITY): Payer: Self-pay | Admitting: Physician Assistant

## 2015-04-22 ENCOUNTER — Other Ambulatory Visit: Payer: Self-pay | Admitting: Physician Assistant

## 2015-04-22 ENCOUNTER — Telehealth: Payer: Self-pay | Admitting: Physician Assistant

## 2015-04-22 DIAGNOSIS — I10 Essential (primary) hypertension: Secondary | ICD-10-CM

## 2015-04-22 DIAGNOSIS — I480 Paroxysmal atrial fibrillation: Secondary | ICD-10-CM

## 2015-04-22 DIAGNOSIS — Z79899 Other long term (current) drug therapy: Secondary | ICD-10-CM

## 2015-04-22 DIAGNOSIS — E669 Obesity, unspecified: Secondary | ICD-10-CM

## 2015-04-22 LAB — GLUCOSE, CAPILLARY
GLUCOSE-CAPILLARY: 168 mg/dL — AB (ref 65–99)
GLUCOSE-CAPILLARY: 220 mg/dL — AB (ref 65–99)

## 2015-04-22 LAB — HEPATIC FUNCTION PANEL
ALK PHOS: 57 U/L (ref 38–126)
ALT: 14 U/L — AB (ref 17–63)
AST: 21 U/L (ref 15–41)
Albumin: 3.6 g/dL (ref 3.5–5.0)
BILIRUBIN INDIRECT: 0.9 mg/dL (ref 0.3–0.9)
Bilirubin, Direct: 0.1 mg/dL (ref 0.1–0.5)
TOTAL PROTEIN: 6.6 g/dL (ref 6.5–8.1)
Total Bilirubin: 1 mg/dL (ref 0.3–1.2)

## 2015-04-22 LAB — BASIC METABOLIC PANEL
Anion gap: 7 (ref 5–15)
BUN: 29 mg/dL — AB (ref 6–20)
CALCIUM: 8.8 mg/dL — AB (ref 8.9–10.3)
CO2: 27 mmol/L (ref 22–32)
CREATININE: 0.94 mg/dL (ref 0.61–1.24)
Chloride: 97 mmol/L — ABNORMAL LOW (ref 101–111)
GFR calc Af Amer: >60 mL/min (ref >60)
GLUCOSE: 159 mg/dL — AB (ref 65–99)
Potassium: 4.8 mmol/L (ref 3.5–5.1)
SODIUM: 131 mmol/L — AB (ref 135–145)

## 2015-04-22 MED ORDER — FUROSEMIDE 20 MG PO TABS: 20.0000 mg | ORAL_TABLET | Freq: Every day | ORAL | Status: DC

## 2015-04-22 MED ORDER — FUROSEMIDE 20 MG PO TABS
20.0000 mg | ORAL_TABLET | Freq: Every day | ORAL | Status: DC
  Administered 2015-04-22: 20 mg via ORAL
  Filled 2015-04-22: qty 1

## 2015-04-22 MED ORDER — ASPIRIN EC 81 MG PO TBEC: 81.0000 mg | DELAYED_RELEASE_TABLET | Freq: Every day | ORAL | Status: DC

## 2015-04-22 MED ORDER — APIXABAN 5 MG PO TABS: 5.0000 mg | ORAL_TABLET | Freq: Two times a day (BID) | ORAL | Status: DC

## 2015-04-22 NOTE — Telephone Encounter (Signed)
New message     TCM appt on 8-15 with Richardson Dopp

## 2015-04-22 NOTE — Discharge Summary (Signed)
Discharge Summary   Patient ID: Thomas Wright MRN: [id number], DOB/AGE: Oct 01, 1945 69 y.o. Admit date: 04/19/2015 D/C date:     04/22/2015  Primary Care Provider: [name], MD Primary Cardiologist: [facility]  Primary Discharge Diagnoses:  1. Dyspnea possibly due to acute on chronic diastolic CHF  2. Chest pressure likely due to #1 3. Paroxysmal atrial fibrillation, maintaining NSR 4. 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 HTN 6. Hyponatremia, possibly due to diuresis 7. Obesity Body mass index is 33.17 kg/(m^2).  Secondary Discharge Diagnoses:  1. H/o hypertriglyceridemia 2. DM type II, insulin dependent 3. Arthritis 4. Depression  Hospital Course: [name] is a 69 y/o M with history of bioprosthetic AVR in 05/2007 in [place], paroxysmal atrial fib, NICM with EF 30% at time of AVR (improved to 45% in 2013, 55-60% in 11/2014), HTN, HL, & DM2 who was admitted with dyspnea, weight gain, nonproductive cough and abdominal bloating.    Regarding history, he has history of post-op atrial fib in 2008. He had recurrent afib 08/2014 with LAA clot noted on TEE. EF was down again at 25%. 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 DCM was felt to be tachy induced 2/2 improved EF in NSR. He also has a reported h/o cath in 2008 without significant CAD.  He presented to the office 04/19/15 with 2 weeks of worsening DOE, abdominal bloating,  nonproductive cough and weight gain.He was up 20 lbs since May. Although he felt some this was likely due to worse eating habits, he also felt like there was a component of fluid as well. He reported constant chest pressure that was worse with activity. He reported that he self-discontinued Eliquis because he  could not afford it. He denied any radiating symptoms.He reported 2 pillow orthopnea, PND, and SOB with minimal activity. He denied any LE edema or syncope. No LE edema.He has been going to the wound center for a R foot diabetic ulcer. His symptoms were felt consistent with diastolic CHF. He was admitted for further diuresis. He was started on 41m IV BID of Lasix with gradual clinical improvement. Curiously some of his workup was not convincing for CHF (BNP normal) although he did behave as though he had clinical CHF. 2D echo 04/20/15 did show elevated LV filling pressures. It also showed EF 583-66% grade 1 diastolic dysfunction, aVR is well-seated, mild central AI, no leak, normal gradients. CTA was undertaken which was negative for PE; there was LV enlargement with left ventricular hypertrophy. Regarding chest pain, this was felt due to possible volume overloaded as it also improved with diuresis. Troponins remained negative. CTA negative as above. Involvement from amiodarone was considered - however, his CT of the chest did not show any reticular opacities or interstitial changes suggestive of toxicity. TSH and hepatic function panel were OK. He did complain of some nausea though. Given the duration on amiodarone thus far, his dose was lowered to once daily (the patient reported taking it BID prior to admission). Aspirin was decreased to once daily dosing. He maintained NSR throughout his stay. Care management saw him and provided him with forms to submit for Eliquis assistance which was restarted this admission. He diuresed from 250lb->244lb with -3.4L. He was educated regarding salt restriction. Sodium decreased slightly with diuresis down  to 131 so we cut back on Lasix today to 10m daily only. The patient asked Dr. SMarlou Porchabout disability although it is not clear that he would qualify. Dr. SMarlou Porchfeels as though he has met maximum benefit from inpatient hospitalization - he has seen and examined the patient  today and feels he is stable for discharge. I have sent a message to our patient care coordinator to arrange outpatient baseline PFTs given amiodarone use. We will have the patient follow up in about a week in flex clinic. He would likely benefit from a repeat BMET at that time.  Discharge Vitals: Blood pressure 126/86, pulse 68, temperature 98.5 F (36.9 C), temperature source Oral, resp. rate 18, height 6' (1.829 m), weight 244 lb 9.6 oz (110.95 kg), SpO2 96 %.  Labs: Lab Results  Component Value Date   WBC 12.0* 04/19/2015   HGB 14.7 04/19/2015   HCT 42.3 04/19/2015   MCV 81.5 04/19/2015   PLT 304 04/19/2015    Recent Labs Lab 04/22/15 0403  NA 131*  K 4.8  CL 97*  CO2 27  BUN 29*  CREATININE 0.94  CALCIUM 8.8*  PROT 6.6  BILITOT 1.0  ALKPHOS 57  ALT 14*  AST 21  GLUCOSE 159*    Recent Labs  04/19/15 1828 04/19/15 2342 04/20/15 0515  TROPONINI <0.03 <0.03 <0.03    Lab Results  Component Value Date   DDIMER 0.61* 04/19/2015    Diagnostic Studies/Procedures   Dg Chest 2 View 04/20/2015   CLINICAL DATA:  Acute CHF  EXAM: CHEST  2 VIEW  COMPARISON:  PA and lateral chest x-ray of August 04, 2014  FINDINGS: There is chronic elevation of the right hemidiaphragm. The lungs are adequately inflated and clear. There is no pneumothorax or pleural effusion. The heart is normal in size. There is a prosthetic aortic valve. The pulmonary vascularity is not engorged. There are 8 intact sternal wires. The trachea is midline. The bony thorax is unremarkable.  IMPRESSION: There is no evidence of CHF, pneumonia, nor other acute cardiopulmonary abnormality.   Electronically Signed   By: David  JMartiniqueM.D.   On: 04/20/2015 07:15   Ct Angio Chest Pe W/cm &/or Wo Cm 04/20/2015   CLINICAL DATA:  Short of breath.  Elevated D-dimer  EXAM: CT ANGIOGRAPHY CHEST WITH CONTRAST  TECHNIQUE: Multidetector CT imaging of the chest was performed using the standard protocol during bolus administration  of intravenous contrast. Multiplanar CT image reconstructions and MIPs were obtained to evaluate the vascular anatomy.  CONTRAST:  1047mOMNIPAQUE IOHEXOL 350 MG/ML SOLN  COMPARISON:  Chest x-ray 04/20/2015  FINDINGS: Excellent pulmonary artery opacification. Negative for pulmonary emboli. Pulmonary arteries normal in caliber.  Left ventricular enlargement with left ventricular hypertrophy. Aortic valve replacement. Thoracic aorta is not significantly opacified but is non aneurysmal.  Mild atelectasis right lower lobe. Negative for pneumonia or effusion.  Negative for mass or adenopathy.  Cholelithiasis. No mass in the upper abdomen. Splenic calcifications.  Review of the MIP images confirms the above findings.  IMPRESSION: Negative for pulmonary emboli.  Left ventricular enlargement with left ventricular hypertrophy. Aortic valve replacement.  Cholelithiasis.   Electronically Signed   By: ChFranchot Gallo.D.   On: 04/20/2015 16:05    2D echo 04/20/15 - Left ventricle: The cavity size was normal. Systolic function was normal. The estimated ejection fraction was in the range of 50% to 55%. Wall motion was normal; there were no regional wall motion abnormalities. Doppler parameters are  consistent with abnormal left ventricular relaxation (grade 1 diastolic dysfunction). Doppler parameters are consistent with elevated ventricular end-diastolic filling pressure. - Ventricular septum: Septal motion showed paradox. - Aortic valve: Bioprosthetic valve sits well in the aortic position. There is mild central aortic regurgitation and no paravalvular leak. Normal transaortic gradients. Transvalvular velocity was within the normal range. There was no stenosis. There was mild regurgitation. Valve area (VTI): 3.58 cm^2. Valve area (Vmax): 3.57 cm^2. Valve area (Vmean): 3.24 cm^2. - Mitral valve: Calcified annulus. Mildly thickened leaflets . There was mild regurgitation. Valve area by  continuity equation (using LVOT flow): 4 cm^2. - Left atrium: The atrium was mildly dilated. - Right ventricle: The cavity size was mildly dilated. Wall thickness was normal. - Right atrium: The atrium was mildly dilated. - Pulmonary arteries: Systolic pressure was within the normal range. Impressions: - There is no significant difference when compared tio the prior study from 11/22/2014.   Discharge Medications   Current Discharge Medication List    START taking these medications   Details  apixaban (ELIQUIS) 5 MG TABS tablet Take 1 tablet (5 mg total) by mouth 2 (two) times daily. Qty: 60 tablet, Refills: 3    furosemide (LASIX) 20 MG tablet Take 1 tablet (20 mg total) by mouth daily. Qty: 30 tablet, Refills: 3      CONTINUE these medications which have CHANGED   Details  aspirin EC 81 MG tablet Take 1 tablet (81 mg total) by mouth daily.      CONTINUE these medications which have NOT CHANGED   Details  amiodarone (PACERONE) 200 MG tablet Take 1 tablet (200 mg total) by mouth daily.     atorvastatin (LIPITOR) 10 MG tablet Take 10 mg by mouth daily.    carvedilol (COREG) 25 MG tablet Take 0.5 tablets (12.5 mg total) by mouth 2 (two) times daily with a meal. Take 1/2 tablet twice daily    glipiZIDE (GLUCOTROL) 5 MG tablet Take 5 mg by mouth daily before breakfast.    insulin aspart (NOVOLOG FLEXPEN) 100 UNIT/ML FlexPen Inject 6 units with each meal     insulin glargine (LANTUS) 100 UNIT/ML injection Inject 0.25 mLs (25 Units total) into the skin at bedtime.     metFORMIN (GLUCOPHAGE) 1000 MG tablet Take 1,000 mg by mouth 2 (two) times daily with a meal.    Multiple Vitamin (MULTIVITAMIN) tablet Take 1 tablet by mouth daily.        Disposition   The patient will be discharged in stable condition to home. Discharge Instructions    Diet - low sodium heart healthy    Complete by:  As directed      Increase activity slowly    Complete by:  As directed    Your aspirin dose was lowered to once a day. Your amiodarone dose was lowered to once a day. Restart Eliquis. Please submit the assistance paperwork that was given to you by care management. A new prescription for once-daily Lasix was sent in.  FYI: Patients taking Eliquis should generally limit and stay away from medicines like ibuprofen, Advil, Motrin, naproxen, and Aleve due to risk of stomach bleeding. You may take Tylenol as directed or talk to your primary doctor about alternatives.          Follow-up Information    Follow up with Richardson Dopp, PA-C.   Specialties:  Physician Assistant, Radiology, Interventional Cardiology   Why:  [facility] - 05/02/15 at 10am   Contact information:   [address] [place] [address] [phone]       Follow up with Pulmonary function tests.   Why:  Patients who are on amiodarone should occasionally get testing of their lung function. The office will call you to arrange a baseline test.        Duration of Discharge Encounter: Greater than 30 minutes including physician and PA time.  Signed, Lisbeth Renshaw, Dunn PA-C 04/22/2015, 2:12 PM  Personally seen and examined. Agree with above. We have decided to decrease his amiodarone dose to 200 mg once a day from twice a day. Perhaps this will help some of his symptoms. His 20 pound weight gain may be in part from fluid overload however he did not require a significant amount of diuresis here in the hospital. Decrease overall caloric intake. By a prostatic aortic valve is functioning normally. Ejection fraction is normal.  [name], MD

## 2015-04-22 NOTE — Progress Notes (Signed)
Patient: Thomas Wright / Admit Date: 04/19/2015 / Date of Encounter: 04/22/2015, 8:21 AM   Subjective: Breathing is better, reports nonproductive cough still persists every so often. Says he's been nauseated and nothing tastes good for the last 2 days.  Objective: Telemetry: NSR rare PVC Physical Exam: Blood pressure 135/60, pulse 65, temperature 97.5 F (36.4 C), temperature source Oral, resp. rate 20, height 6' (1.829 m), weight 244 lb 9.6 oz (110.95 kg), SpO2 96 %. General: Well developed, well nourished WM in no acute distress. Head: Normocephalic, atraumatic, sclera non-icteric, no xanthomas, nares are without discharge. Neck: Negative for carotid bruits. JVP not elevated. Lungs: Clear bilaterally to auscultation without wheezes, rales, or rhonchi. Breathing is unlabored. Heart: RRR S1 S2 without murmurs, rubs, or gallops.  Abdomen: Soft, non-tender, non-distended with normoactive bowel sounds. No rebound/guarding. Extremities: No clubbing or cyanosis. No edema. Distal pedal pulses are 2+ and equal bilaterally. Right foot wrapped Neuro: Alert and oriented X 3. Moves all extremities spontaneously. Psych:  Responds to questions appropriately with a normal affect.   Intake/Output Summary (Last 24 hours) at 04/22/15 0821 Last data filed at 04/22/15 0606  Gross per 24 hour  Intake   2940 ml  Output   2455 ml  Net    485 ml    Inpatient Medications:  . amiodarone  200 mg Oral Daily  . apixaban  5 mg Oral BID  . aspirin EC  81 mg Oral Daily  . atorvastatin  10 mg Oral Daily  . carvedilol  12.5 mg Oral BID WC  . furosemide  40 mg Intravenous BID  . glipiZIDE  5 mg Oral QAC breakfast  . insulin aspart  0-5 Units Subcutaneous QHS  . insulin aspart  6 Units Subcutaneous TID WC  . insulin glargine  30 Units Subcutaneous QHS  . metFORMIN  1,000 mg Oral BID WC  . potassium chloride  20 mEq Oral BID  . sodium chloride  3 mL Intravenous Q12H   Infusions:    Labs:  Recent Labs  04/21/15 0315 04/22/15 0403  NA 132* 131*  K 4.6 4.8  CL 98* 97*  CO2 27 27  GLUCOSE 191* 159*  BUN 25* 29*  CREATININE 1.00 0.94  CALCIUM 8.7* 8.8*    Recent Labs  04/19/15 1828  AST 19  ALT 18  ALKPHOS 75  BILITOT 0.5  PROT 6.9  ALBUMIN 3.6    Recent Labs  04/19/15 1828  WBC 12.0*  NEUTROABS 6.5  HGB 14.7  HCT 42.3  MCV 81.5  PLT 304    Recent Labs  04/19/15 1828 04/19/15 2342 04/20/15 0515  TROPONINI <0.03 <0.03 <0.03   Invalid input(s): POCBNP No results for input(s): HGBA1C in the last 72 hours.   Radiology/Studies:  Dg Chest 2 View  04/20/2015   CLINICAL DATA:  Acute CHF  EXAM: CHEST  2 VIEW  COMPARISON:  PA and lateral chest x-ray of August 04, 2014  FINDINGS: There is chronic elevation of the right hemidiaphragm. The lungs are adequately inflated and clear. There is no pneumothorax or pleural effusion. The heart is normal in size. There is a prosthetic aortic valve. The pulmonary vascularity is not engorged. There are 8 intact sternal wires. The trachea is midline. The bony thorax is unremarkable.  IMPRESSION: There is no evidence of CHF, pneumonia, nor other acute cardiopulmonary abnormality.   Electronically Signed   By: David  Martinique M.D.   On: 04/20/2015 07:15   Ct Angio Chest Pe W/cm &/or  Wo Cm  04/20/2015   CLINICAL DATA:  Short of breath.  Elevated D-dimer  EXAM: CT ANGIOGRAPHY CHEST WITH CONTRAST  TECHNIQUE: Multidetector CT imaging of the chest was performed using the standard protocol during bolus administration of intravenous contrast. Multiplanar CT image reconstructions and MIPs were obtained to evaluate the vascular anatomy.  CONTRAST:  133mL OMNIPAQUE IOHEXOL 350 MG/ML SOLN  COMPARISON:  Chest x-ray 04/20/2015  FINDINGS: Excellent pulmonary artery opacification. Negative for pulmonary emboli. Pulmonary arteries normal in caliber.  Left ventricular enlargement with left ventricular hypertrophy. Aortic valve replacement. Thoracic aorta is not  significantly opacified but is non aneurysmal.  Mild atelectasis right lower lobe. Negative for pneumonia or effusion.  Negative for mass or adenopathy.  Cholelithiasis. No mass in the upper abdomen. Splenic calcifications.  Review of the MIP images confirms the above findings.  IMPRESSION: Negative for pulmonary emboli.  Left ventricular enlargement with left ventricular hypertrophy. Aortic valve replacement.  Cholelithiasis.   Electronically Signed   By: Franchot Gallo M.D.   On: 04/20/2015 16:05   Mr Foot Right Wo Contrast  03/24/2015   CLINICAL DATA:  Soft tissue ulceration on the plantar aspect of the right foot at the little toe.  EXAM: MRI OF THE RIGHT FOREFOOT WITHOUT CONTRAST  TECHNIQUE: Multiplanar, multisequence MR imaging was performed. No intravenous contrast was administered.  COMPARISON:  Radiographs dated 11/23/2014  FINDINGS: There is a soft tissue ulceration on the plantar aspect of the ball of the foot superficial to the head of the fifth metatarsal. There is no osteomyelitis or joint effusion. There is no deep abscess. The bones of the forefoot appear normal except for minimal arthritis at the first metatarsal phalangeal joint. The muscles and tendons appear normal.  IMPRESSION: Soft tissue ulceration on the ball of the foot with no deep extension. No osteomyelitis or abscess. No septic joint.   Electronically Signed   By: Lorriane Shire M.D.   On: 03/24/2015 21:33     Assessment and Plan  69 y/o M with history of bioprosthetic AVR in 05/2007 in [place], post op AFib, NICM with EF 30% at time of AVR (improved to 45% in 2013), HTN, HL, DM2.Had recurrent AFib 08/2014 with LAA clot noted on TEE.EF was down again at 25%. He was anticoagulated with Eliquis and placed on Amiodarone.Ultimately underwent TEE-DCCV 08/2014. 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 Readmitted 04/19/15 with SOB.  1. Dyspnea - clinically he is behaving like acute on chronic diastolic CHF with 2 weeks of worsening dyspnea, bloating, +20lb weight gain since May. Curiously BNP was normal and CXR showed no evidence of CHF. CTA negative for PE. He does feel some of this weight is due to eating habits but does not think that's the whole picture - used to cook for himself and now someone cooks for him with less healthy options including ++salt. With diuresis, weight is now down 6lbs and dyspnea has improved. However, he still reports nonproductive cough and now reports nausea and poor appetite. He wonders if it is due to the medicines. Na is falling further and BUN rising. Will hold Lasix this AM pending discussion with MD about our next steps. D/C KCl given rising K.  2. Chest pain - felt 2/2 #1. Troponins negative. CTA neg for PE.  H/o normal cors 2008. Troponins neg x 3. Can consider outpatient nuc.  3. Paroxysmal atrial fibrillation - maintaining NSR on amiodarone, dose reduced this admission. At some point will need baseline PFTs. Patient will be sending off assistance form to apply for Eliquis assistance per CM note.  4. 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  5. HTN - controlled.  Signed, Melina Copa PA-C Pager: [phone]   Personally seen and examined. Agree with above. OK for DC Decreased amio to 200 QD If he eventually wants to come off, would need to discuss ablation options with Dr. Rayann Heman. Not an optimal candidate.   [name], MD

## 2015-04-22 NOTE — Progress Notes (Signed)
Pt discharged home. Discharge instructions completed with wife and patient.

## 2015-04-22 NOTE — Care Management Important Message (Signed)
Important Message  Patient Details  Name: Thomas Wright MRN: [id number] Date of Birth: Dec 03, 1945   Medicare Important Message Given:  Yes-second notification given    Pricilla Handler 04/22/2015, 11:23 AM

## 2015-04-25 NOTE — Telephone Encounter (Signed)
I left a message for the patient to call. 

## 2015-04-26 DIAGNOSIS — E11621 Type 2 diabetes mellitus with foot ulcer: Secondary | ICD-10-CM | POA: Diagnosis not present

## 2015-04-26 DIAGNOSIS — L97211 Non-pressure chronic ulcer of right calf limited to breakdown of skin: Secondary | ICD-10-CM | POA: Diagnosis not present

## 2015-04-26 DIAGNOSIS — E1140 Type 2 diabetes mellitus with diabetic neuropathy, unspecified: Secondary | ICD-10-CM | POA: Diagnosis not present

## 2015-04-26 DIAGNOSIS — L97411 Non-pressure chronic ulcer of right heel and midfoot limited to breakdown of skin: Secondary | ICD-10-CM | POA: Diagnosis not present

## 2015-04-26 NOTE — Telephone Encounter (Signed)
LMTCB

## 2015-04-27 ENCOUNTER — Ambulatory Visit (INDEPENDENT_AMBULATORY_CARE_PROVIDER_SITE_OTHER): Payer: Commercial Managed Care - HMO | Admitting: Internal Medicine

## 2015-04-27 DIAGNOSIS — Z79899 Other long term (current) drug therapy: Secondary | ICD-10-CM | POA: Diagnosis not present

## 2015-04-27 LAB — PULMONARY FUNCTION TEST
DL/VA % pred: 125 %
DL/VA: 5.81 ml/min/mmHg/L
DLCO unc % pred: 79 %
DLCO unc: 26.22 ml/min/mmHg
FEF 25-75 POST: 2.24 L/s
FEF 25-75 Pre: 2.04 L/sec
FEF2575-%CHANGE-POST: 10 %
FEF2575-%PRED-POST: 84 %
FEF2575-%Pred-Pre: 77 %
FEV1-%Change-Post: 0 %
FEV1-%PRED-PRE: 69 %
FEV1-%Pred-Post: 69 %
FEV1-POST: 2.38 L
FEV1-PRE: 2.36 L
FEV1FVC-%Change-Post: 0 %
FEV1FVC-%Pred-Pre: 107 %
FEV6-%CHANGE-POST: 1 %
FEV6-%PRED-POST: 68 %
FEV6-%Pred-Pre: 67 %
FEV6-PRE: 2.93 L
FEV6-Post: 2.97 L
FEV6FVC-%CHANGE-POST: 0 %
FEV6FVC-%PRED-PRE: 103 %
FEV6FVC-%Pred-Post: 104 %
FVC-%CHANGE-POST: 0 %
FVC-%PRED-POST: 64 %
FVC-%Pred-Pre: 64 %
FVC-POST: 2.99 L
FVC-Pre: 2.98 L
POST FEV1/FVC RATIO: 79 %
PRE FEV1/FVC RATIO: 79 %
Post FEV6/FVC ratio: 99 %
Pre FEV6/FVC Ratio: 99 %
RV % PRED: 76 %
RV: 1.86 L
TLC % pred: 68 %
TLC: 4.88 L

## 2015-04-27 NOTE — Progress Notes (Signed)
PFT done today. 

## 2015-04-27 NOTE — Telephone Encounter (Signed)
Patient contacted regarding discharge from [facility] on April 22, 2015.  Patient understands to follow up with provider Richardson Dopp, PA-C on May 02, 2015 at [place] at [facility]. Patient understands discharge instructions? yes Patient understands medications and regiment? yes Patient understands to bring all medications to this visit? yes  Pt stated that he has been unable to pick up his Eliquis since d/c because he is unable to afford it. Pt stated that he has paperwork for the assistance program that he plans to bring to his appt. Informed pt that we have samples available that he can pick up from our office. Pt has appt in [place] today and will come by to pick them up.

## 2015-04-28 ENCOUNTER — Telehealth: Payer: Self-pay

## 2015-04-28 DIAGNOSIS — R942 Abnormal results of pulmonary function studies: Secondary | ICD-10-CM

## 2015-04-28 NOTE — Telephone Encounter (Signed)
Called patient about pulmonary function test result. Per Melina Copa PA, testing was somewhat abnormal. Informed patient that he will be referred to pulmonology for further evaluation. Patient verbalized understanding.

## 2015-05-01 NOTE — Progress Notes (Signed)
Cardiology Office Note   Date:  05/02/2015   ID:  Thomas Wright, DOB Feb 06, 1946, MRN [id number]  PCP:  [name], MD  Cardiologist:  Dr. [name]   Electrophysiologist:  N/a   Chief Complaint  Patient presents with  . Hospitalization Follow-up  . Congestive Heart Failure  . Shortness of Breath     History of Present Illness: Thomas Wright is a 69 y.o. male with a hx of bioprosthetic AVR in 05/2007 in [place], post op AFib, NICM with EF 30% at time of AVR (improved to 45% in 2013), HTN, HL, DM2.  He had recurrent AFib 08/2014 with LAA clot noted on TEE.  EF was down again at 25%.  He was anticoagulated with Eliquis and placed on Amiodarone.  He ultimately underwent TEE-DCCV 08/2014.  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   DCM was felt to be tachy induced 2/2 improved EF in NSR.    He was admitted from the office 8/2-8/5 for acute diastolic CHF.  His weight was up 20 lbs.  He remained in NSR.  He had stopped his Eliquis due to cost.   He was diuresed with IV Lasix with gradual clinical improvement.  Paradoxically, his BNP was normal.  Echo demonstrated stable AVR, normal LVF and there was elevated LV filling pressures.  Chest CT was negative for pulmonary embolism. Patient did complain of chest discomfort but this improved with diuresis. Cardiac enzymes were negative. There were no changes on chest CT to suggest amiodarone pulmonary toxicity. Amiodarone was decreased to once daily. He remained in sinus rhythm. He lost 6 pounds and was -3.4 L at discharge. He was set up for pulmonary function test as an OP.  This demonstrated findings consistent with some moderate to severe restriction consistent with fibrosis or interstitial inflammation. Findings were discussed with Dr. Marlou Porch who recommended referral to pulmonology.    He returns for FU.  He is  here today with his wife. He is stable since discharge from the hospital. He continues to be hoarse. He has a nonproductive cough. He sleeps on 2 pillows. He has had PND. He denies LE edema. He does note increased abdominal girth. He was not given a prescription for Lasix at discharge. His weight is increased 6 pounds since discharge. He denies any dyspnea similar to his original presenting symptoms. However, he continues to be short of breath with mild to moderate activities. He is NYHA 2b-3. He denies syncope. He denies exertional chest pain. He did have an episode of left-sided chest discomfort while laying down several nights ago. He took aspirin with relief. His cough is productive clear sputum. He denies hemoptysis. He denies fevers. He denies melena or hematochezia. He denies any hematuria. He returns to the wound clinic this week for his diabetic foot ulcer. He sees pulmonology tomorrow.   Studies/Reports Reviewed Today:  Echo 04/20/15 EF 50-55%, no RWMA, Gr 1 DD, paradoxical motion of ventricular septum, AVR ok with mild central AI, no perivalvular leak, no AS,  Aortic Valve area (VTI): 3.58 cm^2. Valvearea (Vmax): 3.57 cm^2. Valve area (Vmean): 3.24 cm^2, MAC, mild MR, mild LAE, mild RVE, mild RAE Impressions:- There is no significant difference when compared tio the priorstudy from 11/22/2014.  PFTs 04/27/15 FEV1 69%, FEV1/FVC 79%, DLCO 79% Interpretation: Mild airway obstruction, moderate to severe restriction consistent with fibrosis or interstitial  inflammation  Chest CTA 04/20/15 IMPRESSION: Negative for pulmonary emboli. Left ventricular enlargement with left ventricular hypertrophy.  Aortic valve replacement. Cholelithiasis.  Echo 3/16 Moderate LVH, EF 03-47%, grade 1 diastolic dysfunction, well-seated bioprosthetic AVR (peak and mean gradients 20 and 12 mmHg), dilated aortic root (44 mm), MAC, mild MR, moderate LAE, mild RAE, mild TR, PASP 31 mmHg  Myoview 3/14 EF 42%, no  ischemia   Past Medical History  Diagnosis Date  . Hypertension   . PAF (paroxysmal atrial fibrillation)     a. Postop 2008. b. recurrent afib 08/2014 with LAA clot noted on TEE. EF was down again at 25%. He was anticoagulated with Eliquis and placed on Amiodarone.He ultimately underwent TEE-DCCV 08/2014.  Marland Kitchen Hypertriglyceridemia   . Cardiomyopathy     EF originally 20%-now 45%, no CAD on cath  . Chronic combined systolic and diastolic CHF (congestive heart failure)     a. 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 Reported h/o cath in 2008 without significant CAD.  Marland Kitchen Type II diabetes mellitus   . Arthritis     .  Marland Kitchen Depression     "a little right now" (04/19/2015)  . S/P aortic valve replacement with bioprosthetic valve     a. bioprosthetic AVR in 05/2007 in [place].  . Hyperlipidemia     Past Surgical History  Procedure Laterality Date  . Aortic valve replacement  05/2007    with tissue graft  . I&d extremity Right 05/05/2014    Procedure: IRRIGATION AND DEBRIDEMENT EXTREMITY;  Surgeon: [name], MD;  Location: [place];  Service: Orthopedics;  Laterality: Right;  . Tee without cardioversion N/A 07/23/2014    Procedure: TRANSESOPHAGEAL ECHOCARDIOGRAM (TEE);  Surgeon: Josue Hector, MD;  Location[facility] END[facility]COPY;  Service: Cardiovascular;  Laterality: N/A;  . Cardioversion N/A 07/23/2014    Procedure: CARDIOVERSION;  Surgeon: Josue Hector, MD;  Location: [place] ENDOSCOPY;  Service: Cardiovascular;  Laterality: N/A;  . Tee without cardioversion N/A 09/01/2014    Procedure: TRANSESOPHAGEAL ECHOCARDIOGRAM (TEE);  Surgeon: [name], MD;  Location[facility] ENDOSCOPY;  Service: Cardiovascular;  Laterality: N/A;  . Cardioversion N/A 09/01/2014    Procedure: CARDIOVERSION;  Surgeon: [name], MD;  Location: [facility] ENDOSCOPY;  Service: Cardiovascular;  Laterality: N/A;  . Amputation Right 07/29/2014    Procedure: AMPUTATION  RIGHT LONG FINGER;  Surgeon: [name], MD;  Location: [place];  Service: Orthopedics;  Laterality: Right;  . Cardiac valve replacement    . Tonsillectomy  1954  . Cardiac catheterization  "several"     Current Outpatient Prescriptions  Medication Sig Dispense Refill  . amiodarone (PACERONE) 200 MG tablet Take 1 tablet (200 mg total) by mouth daily. (Patient taking differently: Take 200 mg by mouth daily. Patient taking 200 mg by mouth twice daily) 90 tablet 0  . apixaban (ELIQUIS) 5 MG TABS tablet Take 1 tablet (5 mg total) by mouth 2 (two) times daily. 60 tablet 3  . aspirin EC 81 MG tablet Take 1 tablet (81 mg total) by mouth daily.    Marland Kitchen atorvastatin (LIPITOR) 10 MG tablet Take 10 mg by mouth daily.    . carvedilol (COREG) 25 MG tablet Take 0.5 tablets (12.5 mg total) by mouth 2 (two) times daily with a meal. Take 1/2 tablet twice daily    . furosemide (LASIX) 20 MG tablet Take 1 tablet (20 mg total) by mouth daily. 90 tablet 3  .  glipiZIDE (GLUCOTROL) 5 MG tablet Take 5 mg by mouth daily before breakfast.    . insulin aspart (NOVOLOG FLEXPEN) 100 UNIT/ML FlexPen Inject 6 units with each meal (Patient taking differently: Inject 6 Units into the skin daily. Inject 6 units with each meal) 15 mL 3  . insulin glargine (LANTUS) 100 UNIT/ML injection Inject 0.25 mLs (25 Units total) into the skin at bedtime. 10 mL 11  . metFORMIN (GLUCOPHAGE) 1000 MG tablet Take 1,000 mg by mouth 2 (two) times daily with a meal.    . Multiple Vitamin (MULTIVITAMIN) tablet Take 1 tablet by mouth daily.     No current facility-administered medications for this visit.    Allergies:   Amoxicill-clarithro-omeprazole    Social History:  The patient  reports that he has never smoked. He has never used smokeless tobacco. He reports that he drinks alcohol. He reports that he does not use illicit drugs.   Family History:  The patient's family history includes Colon cancer in his father; Congenital heart disease  in his other; Diabetes in his father and maternal grandmother; Heart disease in his maternal grandfather; Heart murmur in his child; Liver disease in his brother.    ROS:   Please see the history of present illness.   Review of Systems  Constitution: Positive for chills and diaphoresis.  Respiratory: Positive for cough and shortness of breath.   Gastrointestinal: Positive for constipation.  Neurological: Positive for loss of balance.  All other systems reviewed and are negative.     PHYSICAL EXAM: VS:  BP 124/70 mmHg  Pulse 82  Ht 6' (1.829 m)  Wt 250 lb 9.6 oz (113.671 kg)  BMI 33.98 kg/m2  SpO2 99%    Wt Readings from Last 3 Encounters:  05/02/15 250 lb 9.6 oz (113.671 kg)  04/22/15 244 lb 9.6 oz (110.95 kg)  04/19/15 251 lb 1.9 oz (113.907 kg)     GEN: Well nourished, well developed, in no acute distress HEENT: normal Neck: no JVD,   no masses Cardiac:  Normal S1/S2, RRR; no murmur, no rubs or gallops, no edema   Respiratory: Clear to auscultation bilaterally, no rales, no wheezing.  GI:  Distended, no tenderness, no hepatomegaly  MS: no deformity or atrophy Skin: warm and dry  Neuro:  CNs II-XII intact, Strength and sensation are intact Psych: Normal affect   EKG:  EKG is ordered today.  It demonstrates:   NSR, HR 82, LBBB, first-degree AV block, PR 232 ms, QTC 481 ms   Recent Labs: 08/03/2014: Magnesium 2.2 04/19/2015: B Natriuretic Peptide 65.8; Hemoglobin 14.7; Platelets 304; TSH 2.216 04/22/2015: ALT 14*; BUN 29*; Creatinine, Ser 0.94; Potassium 4.8; Sodium 131*    Lipid Panel No results found for: CHOL, TRIG, HDL, CHOLHDL, VLDL, LDLCALC, LDLDIRECT    ASSESSMENT AND PLAN:  1.  Chronic Diastolic CHF:  His weight is up 6 pounds since discharge. He was not given Lasix at discharge. I will resume Lasix 20 mg daily. He will take 40 mg today and tomorrow and then continue 20 mg daily. Check a BMET one week. 2.  Dyspnea:  Dyspnea seems to be multifactorial. Recent  pulmonary function testing abnormal with moderate to severe restriction. He sees pulmonology tomorrow. Chest CT did not demonstrate any findings consistent with amiodarone pulmonary toxicity. If he continues to have dyspnea without clear objective findings, consider stress testing. Of note, he had a normal cardiac catheterization prior to his aortic valve surgery in 2008. 3.  Paroxysmal Atrial Fibrillation:  Maintaining NSR.   If it turns out that amiodarone is responsible for his abnormal PFTs and worsening dyspnea, we may need to consider Tikosyn. In all likelihood, we will probably refer him to the atrial fibrillation clinic for further recommendations. 4.  S/p Bioprosthetic AVR:  Recent echo with stable AVR.  Continue ASA + Eliquis.  Continue SBE prophylaxis.  5.  Dilated Aortic Root:  Recent echo with stable findings and no significant change from prior echo.  6.  HTN:  Controlled.  7.  Hyperlipidemia:  Continue statin. 8.  Diabetic Foot Ulcer:  Managed at the [facility].     Medication Changes: Current medicines are reviewed at length with the patient today.  Concerns regarding medicines are as outlined above.  The following changes have been made:   Discontinued Medications   No medications on file   Modified Medications   Modified Medication Previous Medication   FUROSEMIDE (LASIX) 20 MG TABLET furosemide (LASIX) 20 MG tablet      Take 1 tablet (20 mg total) by mouth daily.    Take 1 tablet (20 mg total) by mouth daily.   New Prescriptions   No medications on file    Labs/ tests ordered today include:   Orders Placed This Encounter  Procedures  . Basic Metabolic Panel (BMET)  . EKG 12-Lead     Disposition:   FU Dr. [name] or me in 2-3 weeks.      Signed, Versie Starks, MHS 05/02/2015 4:38 PM    [facility] [address], [place], [place]  [address] Phone: [phone]; Fax: [phone]

## 2015-05-02 ENCOUNTER — Ambulatory Visit (INDEPENDENT_AMBULATORY_CARE_PROVIDER_SITE_OTHER): Payer: Commercial Managed Care - HMO | Admitting: Physician Assistant

## 2015-05-02 ENCOUNTER — Encounter: Payer: Self-pay | Admitting: Physician Assistant

## 2015-05-02 VITALS — BP 124/70 | HR 82 | Ht 72.0 in | Wt 250.6 lb

## 2015-05-02 DIAGNOSIS — R0602 Shortness of breath: Secondary | ICD-10-CM | POA: Diagnosis not present

## 2015-05-02 DIAGNOSIS — E785 Hyperlipidemia, unspecified: Secondary | ICD-10-CM

## 2015-05-02 DIAGNOSIS — I5032 Chronic diastolic (congestive) heart failure: Secondary | ICD-10-CM | POA: Diagnosis not present

## 2015-05-02 DIAGNOSIS — Z954 Presence of other heart-valve replacement: Secondary | ICD-10-CM

## 2015-05-02 DIAGNOSIS — L97519 Non-pressure chronic ulcer of other part of right foot with unspecified severity: Secondary | ICD-10-CM

## 2015-05-02 DIAGNOSIS — E11621 Type 2 diabetes mellitus with foot ulcer: Secondary | ICD-10-CM

## 2015-05-02 DIAGNOSIS — I480 Paroxysmal atrial fibrillation: Secondary | ICD-10-CM

## 2015-05-02 DIAGNOSIS — I10 Essential (primary) hypertension: Secondary | ICD-10-CM | POA: Diagnosis not present

## 2015-05-02 MED ORDER — FUROSEMIDE 20 MG PO TABS: 20.0000 mg | ORAL_TABLET | Freq: Every day | ORAL | Status: DC

## 2015-05-02 NOTE — Patient Instructions (Signed)
Medication Instructions:  1. INCREASE LASIX TO 40 MG DAILY FOR 2 DAYS THEN GO BACK TO LASIX 20 MG DAILY; RX SENT IN FOR #30 TO [facility] AND #90 SENT TO [facility]  Labwork: 1 WEEK BMET  Testing/Procedures: NONE  Follow-Up: 2-3 WEEKS SCOTT WEAVER, PAC SAME DAY DR. Marlou Porch IS IN THE OFFICE  Any Other Special Instructions Will Be Listed Below (If Applicable).

## 2015-05-03 ENCOUNTER — Encounter: Payer: Self-pay | Admitting: Pulmonary Disease

## 2015-05-03 ENCOUNTER — Ambulatory Visit (INDEPENDENT_AMBULATORY_CARE_PROVIDER_SITE_OTHER): Payer: Commercial Managed Care - HMO | Admitting: Pulmonary Disease

## 2015-05-03 VITALS — BP 100/72 | HR 70 | Temp 98.2°F | Ht 72.0 in | Wt 250.0 lb

## 2015-05-03 DIAGNOSIS — E11621 Type 2 diabetes mellitus with foot ulcer: Secondary | ICD-10-CM | POA: Diagnosis not present

## 2015-05-03 DIAGNOSIS — E1140 Type 2 diabetes mellitus with diabetic neuropathy, unspecified: Secondary | ICD-10-CM | POA: Diagnosis not present

## 2015-05-03 DIAGNOSIS — Z7901 Long term (current) use of anticoagulants: Secondary | ICD-10-CM | POA: Diagnosis not present

## 2015-05-03 DIAGNOSIS — R0600 Dyspnea, unspecified: Secondary | ICD-10-CM | POA: Diagnosis not present

## 2015-05-03 DIAGNOSIS — L97411 Non-pressure chronic ulcer of right heel and midfoot limited to breakdown of skin: Secondary | ICD-10-CM | POA: Diagnosis not present

## 2015-05-03 DIAGNOSIS — L97211 Non-pressure chronic ulcer of right calf limited to breakdown of skin: Secondary | ICD-10-CM | POA: Diagnosis not present

## 2015-05-03 LAB — GLUCOSE, CAPILLARY: Glucose-Capillary: 209 mg/dL — ABNORMAL HIGH (ref 65–99)

## 2015-05-03 NOTE — Progress Notes (Signed)
Subjective:    Patient ID: Thomas Wright, male    DOB: 11-11-1945, 69 y.o.   MRN: [id number]  HPI  Referred for evaluation of dyspnea. Abnormal PFTs.   Thomas Wright is a 69 y/o sent to pulmonary clinic for evaluation of dyspnea. He states that he has been feeling dyspneic for several years. But symptoms have been worsening for 2 months. He is symptomatic with minimal exertion. Unable to walk on plain surface. Sometimes he also gets dyspneic at rest. Associated with cough, non productive in nature.Somtimes he brings up whitish mucus. No wheezing.  He has significant exposure to coal dust and asbestos in past (see below). Denies any fatigue, weight loss, fevers, chills. All other ROS as below   He has significant history of cardiac disease with bioprosthetic AV valve, diastolic heart failure, atrial fibrillation on amiodarone. Admitted from 8/2 to 8/5 with acute CHF. He was started on lasix recently by his cardiologist but has not started taking them yet.  Social history. Never smoker Occasional alcohol use.  Worked in [employer] for 4-5 years during which he had significant exposure to coal dust. 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 He wore protective clothing then that was made of asbestos.  Investigations so far have shown Echo (04/20/15) - Left ventricle: The cavity size was normal. Systolic function was normal. The estimated ejection fraction was in the range of 50% to 55%. Wall motion was normal; there were no regional wall motion abnormalities. Doppler parameters are consistent with abnormal left ventricular relaxation (grade 1 diastolic dysfunction). Doppler parameters are consistent with elevated ventricular end-diastolic filling pressure. - Ventricular septum: Septal motion showed paradox. - Aortic valve: Bioprosthetic valve sits well in the aortic position. There is mild central aortic regurgitation and no paravalvular leak. Normal transaortic gradients.  Transvalvular velocity was within the normal range. There was no stenosis. There was mild regurgitation. Valve area (VTI): 3.58 cm^2. Valve area (Vmax): 3.57 cm^2. Valve area (Vmean): 3.24 cm^2. - Mitral valve: Calcified annulus. Mildly thickened leaflets . There was mild regurgitation. Valve area by continuity equation (using LVOT flow): 4 cm^2. - Left atrium: The atrium was mildly dilated. - Right ventricle: The cavity size was mildly dilated. Wall thickness was normal. - Right atrium: The atrium was mildly dilated. - Pulmonary arteries: Systolic pressure was within the normal range.  CT scan angio (04/20/15) Negative for pulmonary emboli. Left ventricular enlargement with left ventricular hypertrophy. Aortic valve replacement. Cholelithiasis.  PFTs (04/27/15) FVC 4.6 (64%) FEV1 3.41 (69%) F/F 74 TLC 7.12 (68%) DLCO 79% Impression: Mild restriction and reductions in DLCO.  Past Medical History  Diagnosis Date  . Hypertension   . PAF (paroxysmal atrial fibrillation)     a. Postop 2008. b. recurrent afib 08/2014 with LAA clot noted on TEE. EF was down again at 25%. He was anticoagulated with Eliquis and placed on Amiodarone.He ultimately underwent TEE-DCCV 08/2014.  Marland Kitchen Hypertriglyceridemia   . Cardiomyopathy     EF originally 20%-now 45%, no CAD on cath  . Chronic combined systolic and diastolic CHF (congestive heart failure)     a. 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 Reported h/o cath in 2008 without significant CAD.  Marland Kitchen Type II diabetes mellitus   . Arthritis     .  Marland Kitchen Depression     "a little right now" (04/19/2015)  . S/P aortic valve replacement with bioprosthetic valve  a. bioprosthetic AVR in 05/2007 in [place].  . Hyperlipidemia      Review of Systems  Constitutional: Negative for fever and unexpected weight change.  HENT: Negative for congestion, dental problem, ear  pain, nosebleeds, postnasal drip, rhinorrhea, sinus pressure, sneezing, sore throat and trouble swallowing.   Eyes: Negative for redness and itching.  Respiratory: Positive for cough and shortness of breath. Negative for chest tightness and wheezing.   Cardiovascular: Positive for palpitations. Negative for leg swelling.  Gastrointestinal: Negative for nausea and vomiting.  Genitourinary: Negative for dysuria.  Musculoskeletal: Negative for joint swelling.  Neurological: Negative for headaches.  Hematological: Bruises/bleeds easily.  Psychiatric/Behavioral: Negative for dysphoric mood. The patient is not nervous/anxious.    Blood pressure 100/72, pulse 70, temperature 98.2 F (36.8 C), temperature source Oral, height 6' (1.829 m), weight 250 lb (113.399 kg), SpO2 97 %.    Objective:   Physical Exam  Constitutional: He is oriented to person, place, and time. He appears well-developed and well-nourished.  HENT:  Mouth/Throat: Oropharynx is clear and moist.  Eyes: Pupils are equal, round, and reactive to light.  Neck: Normal range of motion. Neck supple.  Cardiovascular: Normal rate, regular rhythm and normal heart sounds.   Pulmonary/Chest: Effort normal and breath sounds normal.  Abdominal: Soft. Bowel sounds are normal.  Musculoskeletal: Normal range of motion.  Neurological: He is alert and oriented to person, place, and time.  Skin: Skin is warm and dry.      Assessment & Plan:  Thomas. Wright has been referred for evaluation of dyspnea worsening over the past 2 months.   His history is significant for exposure in the 60s and 70s to coal dust coal dust and asbestos. I reviewed his imaging. While there is no clear evidence of lung fibrosis or interstitial lung disease, there does appear to be subtle groundglass opacities in the bases. This could reflect the beginning of a fibrotic interstitial lung disease process related to his occupational exposures. He does not have any symptoms or  signs suggesting of connective tissue disease. He is on amiodarone to treat atrial fibrillation but the CT scan is not suggestive of amiodarone toxicity. I will initiate a basic workup for interstitial lung disease.  His recent history is significant for hospitalization this month for congestive heart failure. He has been started on Lasix but has not filled his prescription or started his diuretic regimen. He is significantly over his presumed dry weight of 215 lbs. His PFTs were done on the 10th of this month a few days after discharge with still some volume overload. The restriction on the PFTs could be a reflection of his heart failure. He will return to the clinic in 1 month after being on Lasix at which point I'll repeat his PFTs and reassess the restriction.  Plan: - Check ANA, RF, ESR, CRP - Repeat PFTs in 1 month - RTC in 1 month.

## 2015-05-03 NOTE — Patient Instructions (Signed)
Continue to take the lasix Blood work ordered for next week Get pulmonary function test Return in 1 month for further follow up.

## 2015-05-04 ENCOUNTER — Telehealth: Payer: Self-pay | Admitting: Cardiology

## 2015-05-04 DIAGNOSIS — I10 Essential (primary) hypertension: Secondary | ICD-10-CM

## 2015-05-04 MED ORDER — FUROSEMIDE 20 MG PO TABS: 20.0000 mg | ORAL_TABLET | Freq: Every day | ORAL | Status: DC

## 2015-05-04 NOTE — Telephone Encounter (Signed)
Hold Lisinopril Hold PM dose of Coreg tonight Take the Lasix. Check BP in AM and call office back with readings. If feels any worse, go to ED. Richardson Dopp, PA-C   05/04/2015 5:17 PM

## 2015-05-04 NOTE — Telephone Encounter (Signed)
Left message of orders for pt since I was unable to reach him by phone.  Requested he call MD on call if questions tonight.  Will f/u with him tomorrow. Of note - pt stated earlier that [facility] has never received Rx for Furosemide despite documentation that it was sent several times and received by them.  I re-sent it in to be filled.

## 2015-05-04 NOTE — Telephone Encounter (Signed)
Spoke with pt who is reporting his BP has been low the last couple of days.  He reports yesterday he was "feeling crappy" while at the wound care center and his BP was low.  Today his BP was 134/81 and 126/68 this AM at 6:30 and 8:30 am.  He went to [facility] and started feeling bad.  He became very weak with nausea and H/A.  He has to get a motorized cart to get back to his car.  His BP was 109/58 when he got home around 12:45 pm today.  He reports feeling "OK" right now.  His bloodsugar this am was 138.  Saw pulmonary yesterday and they ordered PFTs to further evaluate his SOB.  In review of his medication he reports he has been taking Lisinopril 2.5 mg a day which is not on his list of medications.  He has not started Furosemide as instructed because [facility] says they have not received the RX as of yet.  Advised to not take Lisinopril at this time.  Pt aware I will forward to Richardson Dopp for review since he saw the pt just a few days ago.  Will determine if pt is to start Furosemide and/or be on Lisinopril.  Pt aware he will receive a call back in follow up.

## 2015-05-04 NOTE — Telephone Encounter (Signed)
New message     Pt c/o BP issue: STAT if pt c/o blurred vision, one-sided weakness or slurred speech  1. What are your last 5 BP readings? Yesterday 68/26; Today @ 6:30am 134/81; Today @ 8:30am 124/68   2. Are you having any other symptoms (ex. Dizziness, headache, blurred vision, passed out)? Pt had slight headache yesterday, no headache today  3. What is your BP issue? Running lower than normal  Please call to discuss

## 2015-05-05 NOTE — Telephone Encounter (Signed)
Spoke with pt who reports he did not get the message I left for him last night.  He did not take the Lisinopril since it is not on the list he was given at his last office visit.  He held his carvedilol because he felt like his BP was too low.  He did start Furosemide last night.  He has not taken his medications this morning. BP was 138/81 during this phone call. He will take them as RXed and recheck his BP.  He will call back in about an hour to follow up with BP reading.

## 2015-05-05 NOTE — Telephone Encounter (Signed)
Pt calling to relay BP from today (8/18) 128/66 p 70

## 2015-05-05 NOTE — Telephone Encounter (Signed)
Spoke with pt.  Advised to continue meds as listed and continue to take BP and HR daily.  He states understanding and will call back if BP drops again.  He will keep appt as scheduled.

## 2015-05-10 ENCOUNTER — Other Ambulatory Visit (INDEPENDENT_AMBULATORY_CARE_PROVIDER_SITE_OTHER): Payer: Commercial Managed Care - HMO

## 2015-05-10 ENCOUNTER — Other Ambulatory Visit: Payer: Commercial Managed Care - HMO

## 2015-05-10 DIAGNOSIS — Z7901 Long term (current) use of anticoagulants: Secondary | ICD-10-CM | POA: Diagnosis not present

## 2015-05-10 DIAGNOSIS — I481 Persistent atrial fibrillation: Secondary | ICD-10-CM | POA: Diagnosis not present

## 2015-05-10 DIAGNOSIS — R0600 Dyspnea, unspecified: Secondary | ICD-10-CM | POA: Diagnosis not present

## 2015-05-10 DIAGNOSIS — I4819 Other persistent atrial fibrillation: Secondary | ICD-10-CM

## 2015-05-10 DIAGNOSIS — L97211 Non-pressure chronic ulcer of right calf limited to breakdown of skin: Secondary | ICD-10-CM | POA: Diagnosis not present

## 2015-05-10 DIAGNOSIS — E11621 Type 2 diabetes mellitus with foot ulcer: Secondary | ICD-10-CM | POA: Diagnosis not present

## 2015-05-10 DIAGNOSIS — Z79899 Other long term (current) drug therapy: Secondary | ICD-10-CM | POA: Diagnosis not present

## 2015-05-10 DIAGNOSIS — L97411 Non-pressure chronic ulcer of right heel and midfoot limited to breakdown of skin: Secondary | ICD-10-CM | POA: Diagnosis not present

## 2015-05-10 DIAGNOSIS — E1140 Type 2 diabetes mellitus with diabetic neuropathy, unspecified: Secondary | ICD-10-CM | POA: Diagnosis not present

## 2015-05-10 LAB — CBC
HCT: 49.5 % (ref 39.0–52.0)
HEMOGLOBIN: 16.7 g/dL (ref 13.0–17.0)
MCHC: 33.7 g/dL (ref 30.0–36.0)
MCV: 82.3 fl (ref 78.0–100.0)
PLATELETS: 333 10*3/uL (ref 150.0–400.0)
RBC: 6.02 Mil/uL — ABNORMAL HIGH (ref 4.22–5.81)
RDW: 13.8 % (ref 11.5–15.5)
WBC: 12.6 10*3/uL — AB (ref 4.0–10.5)

## 2015-05-10 LAB — COMPREHENSIVE METABOLIC PANEL
ALBUMIN: 4.4 g/dL (ref 3.5–5.2)
ALT: 17 U/L (ref 0–53)
AST: 18 U/L (ref 0–37)
Alkaline Phosphatase: 74 U/L (ref 39–117)
BUN: 21 mg/dL (ref 6–23)
CALCIUM: 10.1 mg/dL (ref 8.4–10.5)
CO2: 32 mEq/L (ref 19–32)
CREATININE: 1.09 mg/dL (ref 0.40–1.50)
Chloride: 97 mEq/L (ref 96–112)
GFR: 71.6 mL/min (ref >60.00)
Glucose, Bld: 131 mg/dL — ABNORMAL HIGH (ref 70–99)
Potassium: 5.6 mEq/L — ABNORMAL HIGH (ref 3.5–5.1)
Sodium: 136 mEq/L (ref 135–145)
Total Bilirubin: 0.6 mg/dL (ref 0.2–1.2)
Total Protein: 8.2 g/dL (ref 6.0–8.3)

## 2015-05-10 LAB — RHEUMATOID FACTOR: Rhuematoid fact SerPl-aCnc: <10 IU/mL (ref <=14)

## 2015-05-10 LAB — C-REACTIVE PROTEIN: CRP: 0.1 mg/dL — AB (ref 0.5–20.0)

## 2015-05-10 LAB — SEDIMENTATION RATE: Sed Rate: 11 mm/hr (ref 0–22)

## 2015-05-10 LAB — TSH: TSH: 2.24 u[IU]/mL (ref 0.35–4.50)

## 2015-05-11 LAB — ANA: Anti Nuclear Antibody(ANA): NEGATIVE

## 2015-05-17 ENCOUNTER — Encounter: Payer: Self-pay | Admitting: Cardiology

## 2015-05-17 ENCOUNTER — Ambulatory Visit (INDEPENDENT_AMBULATORY_CARE_PROVIDER_SITE_OTHER): Payer: Commercial Managed Care - HMO | Admitting: Cardiology

## 2015-05-17 VITALS — BP 130/82 | HR 70 | Ht 72.0 in | Wt 250.8 lb

## 2015-05-17 DIAGNOSIS — I480 Paroxysmal atrial fibrillation: Secondary | ICD-10-CM | POA: Diagnosis not present

## 2015-05-17 DIAGNOSIS — L97411 Non-pressure chronic ulcer of right heel and midfoot limited to breakdown of skin: Secondary | ICD-10-CM | POA: Diagnosis not present

## 2015-05-17 DIAGNOSIS — R0600 Dyspnea, unspecified: Secondary | ICD-10-CM

## 2015-05-17 DIAGNOSIS — E11621 Type 2 diabetes mellitus with foot ulcer: Secondary | ICD-10-CM | POA: Diagnosis not present

## 2015-05-17 DIAGNOSIS — Z7901 Long term (current) use of anticoagulants: Secondary | ICD-10-CM

## 2015-05-17 DIAGNOSIS — I5032 Chronic diastolic (congestive) heart failure: Secondary | ICD-10-CM

## 2015-05-17 DIAGNOSIS — E1140 Type 2 diabetes mellitus with diabetic neuropathy, unspecified: Secondary | ICD-10-CM | POA: Diagnosis not present

## 2015-05-17 DIAGNOSIS — L97211 Non-pressure chronic ulcer of right calf limited to breakdown of skin: Secondary | ICD-10-CM | POA: Diagnosis not present

## 2015-05-17 NOTE — Progress Notes (Signed)
Cardiology Office Note   Date:  05/17/2015   ID:  Thomas Wright, DOB [birth year], MRN [id number]  PCP:  [name], MD  Cardiologist:   [name], MD       History of Present Illness: Thomas Wright is a 69 y.o. male who presents for follow-up of diastolic heart failure, chronic, hospitalization in early August 2016 with 20 pound weight gain. Ironically, his BNP was normal. No changes on chest CT scan to suggest amiodarone toxicity. He was down 3.4 L at discharge and was set up to have pulmonary function tests as outpatient. The pulmonary function studies demonstrated moderate to severe restriction consistent with fibrosis or interstitial inflammations. Pulmonary referral was made.  I reviewed pulmonary note. There was significant exposure in the 60s and 70s to cold dust and his testis. 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 Autoimmune serologies were drawn. Sedimentation rate was normal at 11.      Past Medical History  Diagnosis Date  . Hypertension   . PAF (paroxysmal atrial fibrillation)     a. Postop 2008. b. recurrent afib 08/2014 with LAA clot noted on TEE. EF was down again at 25%. He was anticoagulated with Eliquis and placed on Amiodarone.He ultimately underwent TEE-DCCV 08/2014.  Marland Kitchen Hypertriglyceridemia   . Cardiomyopathy     EF originally 20%-now 45%, no CAD on cath  . Chronic combined systolic and diastolic CHF (congestive heart failure)     a. 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 Reported h/o cath in 2008 without significant CAD.  Marland Kitchen Type II diabetes mellitus   . Arthritis     .  Marland Kitchen Depression     "a little right now" (04/19/2015)  . S/P aortic valve replacement with bioprosthetic valve     a. bioprosthetic AVR in 05/2007 in [place].  . Hyperlipidemia     Past Surgical History    Procedure Laterality Date  . Aortic valve replacement  05/2007    with tissue graft  . I&d extremity Right 05/05/2014    Procedure: IRRIGATION AND DEBRIDEMENT EXTREMITY;  Surgeon: [name], MD;  Location: [place];  Service: Orthopedics;  Laterality: Right;  . Tee without cardioversion N/A 07/23/2014    Procedure: TRANSESOPHAGEAL ECHOCARDIOGRAM (TEE);  Surgeon: Josue Hector, MD;  Location[facility] ENDOSCOPY;  Service: Cardiovascular;  Laterality: N/A;  . Cardioversion N/A 07/23/2014    Procedure: CARDIOVERSION;  Surgeon: Josue Hector, MD;  Location: [facility] ENDOSCOPY;  Service: Cardiovascular;  Laterality: N/A;  . Tee without cardioversion N/A 09/01/2014    Procedure: TRANSESOPHAGEAL ECHOCARDIOGRAM (TEE);  Surgeon: [name], MD;  Location: [facility] ENDOSCOPY;  Service: Cardiovascular;  Laterality: N/A;  . Cardioversion N/A 09/01/2014    Procedure: CARDIOVERSION;  Surgeon: [name], MD;  Location: [facility] ENDOSCOPY;  Service: Cardiovascular;  Laterality: N/A;  . Amputation Right 07/29/2014    Procedure: AMPUTATION RIGHT LONG FINGER;  Surgeon: [name], MD;  Location: [place];  Service: Orthopedics;  Laterality: Right;  . Cardiac valve replacement    . Tonsillectomy  1954  . Cardiac catheterization  "several"     Current Outpatient Prescriptions  Medication Sig Dispense Refill  . amiodarone (PACERONE) 200 MG tablet Take 200 mg by mouth 2 (two) times daily.    Marland Kitchen apixaban (ELIQUIS) 5 MG TABS tablet Take 1 tablet (5 mg total) by  mouth 2 (two) times daily. 60 tablet 3  . aspirin EC 81 MG tablet Take 1 tablet (81 mg total) by mouth daily.    Marland Kitchen atorvastatin (LIPITOR) 10 MG tablet Take 10 mg by mouth daily.    . carvedilol (COREG) 25 MG tablet Take 0.5 tablets (12.5 mg total) by mouth 2 (two) times daily with a meal. Take 1/2 tablet twice daily    . furosemide (LASIX) 20 MG tablet Take 1 tablet (20 mg total) by mouth daily. 30 tablet 3  . glipiZIDE (GLUCOTROL) 5 MG tablet Take 5 mg by mouth daily  before breakfast.    . insulin aspart (NOVOLOG FLEXPEN) 100 UNIT/ML FlexPen Inject 6 units with each meal (Patient taking differently: Inject 6 Units into the skin daily. Inject 6 units with each meal) 15 mL 3  . insulin glargine (LANTUS) 100 UNIT/ML injection Inject 0.25 mLs (25 Units total) into the skin at bedtime. 10 mL 11  . metFORMIN (GLUCOPHAGE) 1000 MG tablet Take 1,000 mg by mouth 2 (two) times daily with a meal.    . Multiple Vitamin (MULTIVITAMIN) tablet Take 1 tablet by mouth daily.     No current facility-administered medications for this visit.    Allergies:   Amoxicill-clarithro-omeprazole    Social History:  The patient  reports that he has never smoked. He has never used smokeless tobacco. He reports that he drinks alcohol. He reports that he does not use illicit drugs.   Family History:  The patient's family history includes Colon cancer in his father; Congenital heart disease in his other; Diabetes in his father and maternal grandmother; Heart disease in his maternal grandfather; Heart murmur in his child; Liver disease in his brother.    ROS:  Please see the history of present illness.   Otherwise, review of systems are positive for none.   All other systems are reviewed and negative.    PHYSICAL EXAM: VS:  BP 130/82 mmHg  Pulse 70  Ht 6' (1.829 m)  Wt 250 lb 12.8 oz (113.762 kg)  BMI 34.01 kg/m2  SpO2 97% , BMI Body mass index is 34.01 kg/(m^2). GEN: Well nourished, well developed, in no acute distress HEENT: normal Neck: no JVD, carotid bruits, or masses Cardiac: RRR; no murmurs, rubs, or gallops,no edema  Respiratory:  clear to auscultation bilaterally, normal work of breathing GI: soft, nontender, nondistended, + BS MS: no deformity or atrophy Skin: warm and dry, no rash Neuro:  Strength and sensation are intact Psych: euthymic mood, full affect   EKG:  EKG is not ordered today.    Recent Labs: 08/03/2014: Magnesium 2.2 04/19/2015: B Natriuretic  Peptide 65.8 05/10/2015: ALT 17; BUN 21; Creatinine, Ser 1.09; Hemoglobin 16.7; Platelets 333.0; Potassium 5.6*; Sodium 136; TSH 2.24    Lipid Panel No results found for: CHOL, TRIG, HDL, CHOLHDL, VLDL, LDLCALC, LDLDIRECT    Wt Readings from Last 3 Encounters:  05/17/15 250 lb 12.8 oz (113.762 kg)  05/03/15 250 lb (113.399 kg)  05/02/15 250 lb 9.6 oz (113.671 kg)      Other studies Reviewed: Additional studies/ records that were reviewed today include: labs, radiology, office notes. Review of the above records demonstrates: as above   ASSESSMENT AND PLAN:  1.  Chronic diastolic heart failure-interestingly, his dyspnea did not change all that much in the hospital after diuresing 3-4 L. His BNP on admission was actually normal. Weight gain was the only indicator of potential excess fluid. We will continue his Lasix. Daily weights. Appreciate  pulmonary evaluation. Prior cold exposure, asbestos exposure. PFTs with restrictive physiology.  2. Paroxysmal atrial fibrillation-continue with amiodarone but decreased to 20 mg once a day. Pulmonary did not feel as though his CT findings or lab work (ESR normal) were indicative of amiodarone toxicity causing his dyspnea. We will continue. He does well in normal rhythm.  3. Obesity-continue to encourage weight loss. He is hindered by a right foot ulcer which is hampering his exercise.     Current medicines are reviewed at length with the patient today.  The patient does not have concerns regarding medicines.  The following changes have been made:  Decrease amiodarone  Labs/ tests ordered today include: none  No orders of the defined types were placed in this encounter.     Disposition:   FU with Thomas Wright in 3 months  Signed, [name], MD  05/17/2015 3:28 PM    [facility] [address], [place], [place]  [address] Phone: [phone]; Fax: [phone]

## 2015-05-17 NOTE — Patient Instructions (Signed)
Medication Instructions:  Please decrease your Amiodarone to 200 mg once a day. Continue all other medications as listed.  Follow-Up: Follow up in 3 months with Dr Marlou Porch.  Thank you for choosing [facility]!!

## 2015-05-18 DIAGNOSIS — S91329A Laceration with foreign body, unspecified foot, initial encounter: Secondary | ICD-10-CM | POA: Diagnosis not present

## 2015-05-24 ENCOUNTER — Other Ambulatory Visit: Payer: Commercial Managed Care - HMO

## 2015-05-24 ENCOUNTER — Encounter (HOSPITAL_BASED_OUTPATIENT_CLINIC_OR_DEPARTMENT_OTHER): Payer: Commercial Managed Care - HMO | Attending: General Surgery

## 2015-05-24 DIAGNOSIS — E1165 Type 2 diabetes mellitus with hyperglycemia: Secondary | ICD-10-CM | POA: Diagnosis not present

## 2015-05-24 DIAGNOSIS — E1140 Type 2 diabetes mellitus with diabetic neuropathy, unspecified: Secondary | ICD-10-CM | POA: Insufficient documentation

## 2015-05-24 DIAGNOSIS — E11621 Type 2 diabetes mellitus with foot ulcer: Secondary | ICD-10-CM | POA: Diagnosis not present

## 2015-05-24 DIAGNOSIS — E785 Hyperlipidemia, unspecified: Secondary | ICD-10-CM

## 2015-05-24 DIAGNOSIS — L97411 Non-pressure chronic ulcer of right heel and midfoot limited to breakdown of skin: Secondary | ICD-10-CM | POA: Diagnosis not present

## 2015-05-24 DIAGNOSIS — L97211 Non-pressure chronic ulcer of right calf limited to breakdown of skin: Secondary | ICD-10-CM | POA: Diagnosis not present

## 2015-05-24 DIAGNOSIS — IMO0002 Reserved for concepts with insufficient information to code with codable children: Secondary | ICD-10-CM

## 2015-05-24 LAB — LIPID PANEL
CHOL/HDL RATIO: 3
Cholesterol: 127 mg/dL (ref 0–200)
HDL: 38.7 mg/dL — AB (ref >39.00)
LDL CALC: 64 mg/dL (ref 0–99)
NONHDL: 88.36
Triglycerides: 123 mg/dL (ref 0.0–149.0)
VLDL: 24.6 mg/dL (ref 0.0–40.0)

## 2015-05-24 LAB — BASIC METABOLIC PANEL
BUN: 19 mg/dL (ref 6–23)
CHLORIDE: 99 meq/L (ref 96–112)
CO2: 25 meq/L (ref 19–32)
Calcium: 9.5 mg/dL (ref 8.4–10.5)
Creatinine, Ser: 1.07 mg/dL (ref 0.40–1.50)
GFR: 73.13 mL/min (ref >60.00)
GLUCOSE: 181 mg/dL — AB (ref 70–99)
POTASSIUM: 4.5 meq/L (ref 3.5–5.1)
SODIUM: 136 meq/L (ref 135–145)

## 2015-05-24 LAB — MICROALBUMIN / CREATININE URINE RATIO
Creatinine,U: 188.8 mg/dL
Microalb Creat Ratio: 6 mg/g (ref 0.0–30.0)
Microalb, Ur: 11.3 mg/dL — ABNORMAL HIGH (ref 0.0–1.9)

## 2015-05-26 DIAGNOSIS — E119 Type 2 diabetes mellitus without complications: Secondary | ICD-10-CM | POA: Diagnosis not present

## 2015-05-26 DIAGNOSIS — H43393 Other vitreous opacities, bilateral: Secondary | ICD-10-CM | POA: Diagnosis not present

## 2015-05-26 DIAGNOSIS — H2513 Age-related nuclear cataract, bilateral: Secondary | ICD-10-CM | POA: Diagnosis not present

## 2015-05-26 LAB — FRUCTOSAMINE

## 2015-05-31 ENCOUNTER — Other Ambulatory Visit: Payer: Self-pay | Admitting: *Deleted

## 2015-05-31 ENCOUNTER — Telehealth: Payer: Self-pay | Admitting: Endocrinology

## 2015-05-31 DIAGNOSIS — L97211 Non-pressure chronic ulcer of right calf limited to breakdown of skin: Secondary | ICD-10-CM | POA: Diagnosis not present

## 2015-05-31 DIAGNOSIS — E1140 Type 2 diabetes mellitus with diabetic neuropathy, unspecified: Secondary | ICD-10-CM | POA: Diagnosis not present

## 2015-05-31 DIAGNOSIS — E11621 Type 2 diabetes mellitus with foot ulcer: Secondary | ICD-10-CM | POA: Diagnosis not present

## 2015-05-31 DIAGNOSIS — L97411 Non-pressure chronic ulcer of right heel and midfoot limited to breakdown of skin: Secondary | ICD-10-CM | POA: Diagnosis not present

## 2015-05-31 MED ORDER — INSULIN GLARGINE 100 UNIT/ML ~~LOC~~ SOLN: 25.0000 [IU] | Freq: Every day | SUBCUTANEOUS | Status: DC

## 2015-05-31 NOTE — Telephone Encounter (Signed)
Pt needs refill on lantus

## 2015-06-01 ENCOUNTER — Other Ambulatory Visit: Payer: Self-pay | Admitting: Cardiology

## 2015-06-07 DIAGNOSIS — L97411 Non-pressure chronic ulcer of right heel and midfoot limited to breakdown of skin: Secondary | ICD-10-CM | POA: Diagnosis not present

## 2015-06-07 DIAGNOSIS — L97211 Non-pressure chronic ulcer of right calf limited to breakdown of skin: Secondary | ICD-10-CM | POA: Diagnosis not present

## 2015-06-07 DIAGNOSIS — E1140 Type 2 diabetes mellitus with diabetic neuropathy, unspecified: Secondary | ICD-10-CM | POA: Diagnosis not present

## 2015-06-07 DIAGNOSIS — E11621 Type 2 diabetes mellitus with foot ulcer: Secondary | ICD-10-CM | POA: Diagnosis not present

## 2015-06-10 ENCOUNTER — Ambulatory Visit (INDEPENDENT_AMBULATORY_CARE_PROVIDER_SITE_OTHER): Payer: Commercial Managed Care - HMO | Admitting: Pulmonary Disease

## 2015-06-10 ENCOUNTER — Encounter: Payer: Self-pay | Admitting: Pulmonary Disease

## 2015-06-10 VITALS — BP 118/72 | HR 73 | Temp 98.3°F | Ht 72.0 in | Wt 248.0 lb

## 2015-06-10 DIAGNOSIS — R0600 Dyspnea, unspecified: Secondary | ICD-10-CM

## 2015-06-10 LAB — PULMONARY FUNCTION TEST
DL/VA % PRED: 129 %
DL/VA: 6.03 ml/min/mmHg/L
DLCO UNC: 25.89 ml/min/mmHg
DLCO unc % pred: 78 %
FEF 25-75 POST: 1.91 L/s
FEF 25-75 Pre: 1.6 L/sec
FEF2575-%Change-Post: 19 %
FEF2575-%PRED-PRE: 60 %
FEF2575-%Pred-Post: 72 %
FEV1-%Change-Post: 4 %
FEV1-%Pred-Post: 61 %
FEV1-%Pred-Pre: 58 %
FEV1-Post: 2.09 L
FEV1-Pre: 1.99 L
FEV1FVC-%Change-Post: 5 %
FEV1FVC-%PRED-PRE: 103 %
FEV6-%Change-Post: -3 %
FEV6-%PRED-POST: 57 %
FEV6-%Pred-Pre: 59 %
FEV6-POST: 2.49 L
FEV6-Pre: 2.59 L
FEV6FVC-%CHANGE-POST: 0 %
FEV6FVC-%PRED-POST: 105 %
FEV6FVC-%Pred-Pre: 104 %
FVC-%Change-Post: 0 %
FVC-%PRED-PRE: 56 %
FVC-%Pred-Post: 56 %
FVC-PRE: 2.6 L
FVC-Post: 2.58 L
POST FEV1/FVC RATIO: 81 %
PRE FEV1/FVC RATIO: 76 %
Post FEV6/FVC ratio: 100 %
Pre FEV6/FVC Ratio: 99 %
RV % pred: 79 %
RV: 1.94 L
TLC % PRED: 68 %
TLC: 4.89 L

## 2015-06-10 NOTE — Patient Instructions (Signed)
Follow up in 6 months 

## 2015-06-10 NOTE — Progress Notes (Signed)
Subjective:    Patient ID: Thomas Wright, male    DOB: Nov 09, 1945, 69 y.o.   MRN: [id number]   PROBLEM LIST: Dyspnea on exertion Abnormal PFTs- Restrictive pattern.  HPI   PROBLEM LIST: Dyspnea on exertion Abnormal PFTs- Restrictive pattern.  HPI   Thomas Wright is a 69 y/o sent to pulmonary clinic for evaluation of dyspnea. He states that he has been feeling dyspneic for several years. But symptoms have been worsening for 3 months. He is symptomatic with minimal exertion. Unable to walk on plain surface. Sometimes he also gets dyspneic at rest. Associated with cough, non productive in nature.Somtimes he brings up whitish mucus. No wheezing.  He has significant exposure to coal dust and asbestos in past (see below).   He has significant history of cardiac disease with bioprosthetic AV valve, diastolic heart failure, atrial fibrillation on amiodarone. Admitted from 8/2 to 8/5 with acute CHF.   Interm history: Continues to have dyspnea on exertion. No change from previous visit.  Social history. Never smoker Occasional alcohol use.  Worked in [employer] for 4-5 years during which he had significant exposure to coal dust. 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 He wore protective clothing then that was made of asbestos.  Investigations so far have shown Echo (04/20/15) - Left ventricle: The cavity size was normal. Systolic function was normal. The estimated ejection fraction was in the range of 50% to 55%. Wall motion was normal; there were no regional wall motion abnormalities. Doppler parameters are consistent with abnormal left ventricular relaxation (grade 1 diastolic dysfunction). Doppler parameters are consistent with elevated ventricular end-diastolic filling pressure. - Ventricular septum: Septal motion showed paradox. - Aortic valve: Bioprosthetic valve sits well in the aortic position. There is mild central aortic regurgitation and no paravalvular leak. Normal  transaortic gradients. Transvalvular velocity was within the normal range. There was no stenosis. There was mild regurgitation. Valve area (VTI): 3.58 cm^2. Valve area (Vmax): 3.57 cm^2. Valve area (Vmean): 3.24 cm^2. - Mitral valve: Calcified annulus. Mildly thickened leaflets . There was mild regurgitation. Valve area by continuity equation (using LVOT flow): 4 cm^2. - Left atrium: The atrium was mildly dilated. - Right ventricle: The cavity size was mildly dilated. Wall thickness was normal. - Right atrium: The atrium was mildly dilated. - Pulmonary arteries: Systolic pressure was within the normal range.  CT scan angio (04/20/15) Negative for pulmonary emboli. Left ventricular enlargement with left ventricular hypertrophy. Aortic valve replacement. Cholelithiasis.  PFTs (04/27/15) FVC 4.6 (64%) FEV1 3.41 (69%) F/F 74 TLC 7.12 (68%) DLCO 79% Impression: Mild restriction and reductions in DLCO.  PFTs (06/10/15) FVC 2.6  (56%) FEV1 1.99 (58%) F/F 77 TLC 4.89 (68%) DLCO 78%  Past Medical History  Diagnosis Date  . Hypertension   . PAF (paroxysmal atrial fibrillation)     a. Postop 2008. b. recurrent afib 08/2014 with LAA clot noted on TEE. EF was down again at 25%. He was anticoagulated with Eliquis and placed on Amiodarone.He ultimately underwent TEE-DCCV 08/2014.  Marland Kitchen Hypertriglyceridemia   . Cardiomyopathy     EF originally 20%-now 45%, no CAD on cath  . Chronic combined systolic and diastolic CHF (congestive heart failure)     a. 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 Reported h/o cath in 2008 without significant CAD.  Marland Kitchen Type II diabetes mellitus   . Arthritis     .  Marland Kitchen Depression     "  a little right now" (04/19/2015)  . S/P aortic valve replacement with bioprosthetic valve     a. bioprosthetic AVR in 05/2007 in [place].  . Hyperlipidemia      Review of Systems    Constitutional: Negative for fever and unexpected weight change.  HENT: Negative for congestion, dental problem, ear pain, nosebleeds, postnasal drip, rhinorrhea, sinus pressure, sneezing, sore throat and trouble swallowing.   Eyes: Negative for redness and itching.  Respiratory: Positive for cough and shortness of breath. Negative for chest tightness and wheezing.   Cardiovascular: Positive for palpitations. Negative for leg swelling.  Gastrointestinal: Negative for nausea and vomiting.  Genitourinary: Negative for dysuria.  Musculoskeletal: Negative for joint swelling.  Neurological: Negative for headaches.  Hematological: Bruises/bleeds easily.  Psychiatric/Behavioral: Negative for dysphoric mood. The patient is not nervous/anxious.    Blood pressure 118/72, pulse 73, temperature 98.3 F (36.8 C), temperature source Oral, height 6' (1.829 m), weight 248 lb (112.492 kg), SpO2 97 %.     Objective:   Physical Exam  Constitutional: He is oriented to person, place, and time. He appears well-developed and well-nourished.  HENT:  Mouth/Throat: Oropharynx is clear and moist.  Eyes: Pupils are equal, round, and reactive to light.  Neck: Normal range of motion. Neck supple.  Cardiovascular: Normal rate, regular rhythm and normal heart sounds.   Pulmonary/Chest: Effort normal and breath sounds normal.  Abdominal: Soft. Bowel sounds are normal.  Musculoskeletal: Normal range of motion.  Neurological: He is alert and oriented to person, place, and time.  Skin: Skin is warm and dry.      Assessment & Plan:  Thomas Wright has been referred for evaluation of dyspnea worsening over the past 3 months.   His history is significant for exposure in the 60s and 70s to coal dust coal dust and asbestos. While there is no clear evidence of lung fibrosis or interstitial lung disease, there does appear to be subtle groundglass opacities in the bases on CT. This could reflect the beginning of a fibrotic  interstitial lung disease process related to his occupational exposures. He does not have any symptoms or signs suggesting of connective tissue disease and labs are negative so far. He is on amiodarone to treat atrial fibrillation but the CT scan is not suggestive of amiodarone toxicity.  Repeat PFTs show mild restrictive lung disease and reduction in diffusion capacity which corrects for alveolar capacity. I believe this may be related to his body habitus and obesity.  Plan: - Continue to monitor PFTs and imaging. - Advised weight loss and exercise.  Marshell Garfinkel MD [facility] Pager [phone] If no answer or after 3pm call: [phone] 06/10/2015, 3:02 PM

## 2015-06-10 NOTE — Progress Notes (Signed)
PFT done today. 

## 2015-06-14 DIAGNOSIS — E11621 Type 2 diabetes mellitus with foot ulcer: Secondary | ICD-10-CM | POA: Diagnosis not present

## 2015-06-14 DIAGNOSIS — L97211 Non-pressure chronic ulcer of right calf limited to breakdown of skin: Secondary | ICD-10-CM | POA: Diagnosis not present

## 2015-06-14 DIAGNOSIS — E1140 Type 2 diabetes mellitus with diabetic neuropathy, unspecified: Secondary | ICD-10-CM | POA: Diagnosis not present

## 2015-06-14 DIAGNOSIS — L97411 Non-pressure chronic ulcer of right heel and midfoot limited to breakdown of skin: Secondary | ICD-10-CM | POA: Diagnosis not present

## 2015-06-20 ENCOUNTER — Encounter: Payer: Commercial Managed Care - HMO | Attending: Endocrinology | Admitting: Nutrition

## 2015-06-20 ENCOUNTER — Encounter: Payer: Self-pay | Admitting: Endocrinology

## 2015-06-20 ENCOUNTER — Other Ambulatory Visit: Payer: Self-pay | Admitting: *Deleted

## 2015-06-20 ENCOUNTER — Ambulatory Visit (INDEPENDENT_AMBULATORY_CARE_PROVIDER_SITE_OTHER): Payer: Commercial Managed Care - HMO | Admitting: Endocrinology

## 2015-06-20 VITALS — BP 148/94 | HR 82 | Temp 98.2°F | Resp 16 | Ht 72.0 in | Wt 252.0 lb

## 2015-06-20 DIAGNOSIS — E119 Type 2 diabetes mellitus without complications: Secondary | ICD-10-CM | POA: Insufficient documentation

## 2015-06-20 DIAGNOSIS — E1142 Type 2 diabetes mellitus with diabetic polyneuropathy: Secondary | ICD-10-CM

## 2015-06-20 DIAGNOSIS — IMO0002 Reserved for concepts with insufficient information to code with codable children: Secondary | ICD-10-CM

## 2015-06-20 DIAGNOSIS — Z794 Long term (current) use of insulin: Secondary | ICD-10-CM | POA: Insufficient documentation

## 2015-06-20 DIAGNOSIS — E118 Type 2 diabetes mellitus with unspecified complications: Secondary | ICD-10-CM

## 2015-06-20 DIAGNOSIS — Z713 Dietary counseling and surveillance: Secondary | ICD-10-CM | POA: Insufficient documentation

## 2015-06-20 DIAGNOSIS — E1165 Type 2 diabetes mellitus with hyperglycemia: Secondary | ICD-10-CM | POA: Diagnosis not present

## 2015-06-20 LAB — POCT GLYCOSYLATED HEMOGLOBIN (HGB A1C): HEMOGLOBIN A1C: 11

## 2015-06-20 MED ORDER — "INSULIN SYRINGE-NEEDLE U-100 31G X 5/16"" 0.3 ML MISC": Status: DC

## 2015-06-20 MED ORDER — INSULIN REGULAR HUMAN 100 UNIT/ML IJ SOLN
INTRAMUSCULAR | Status: DC
Start: 2015-06-20 — End: 2016-01-09

## 2015-06-20 MED ORDER — INSULIN REGULAR HUMAN 100 UNIT/ML IJ SOLN: INTRAMUSCULAR | Status: DC

## 2015-06-20 NOTE — Progress Notes (Signed)
Discussed the difference between Novolog and R insulin, and the need to wait 30-45 min. After injecting the R insulin before eating.  He reported good understanding of this. He was shown how to draw up and inject insulin using a vial and syringe, and he re demonstrated this without difficulty.  He was given a sample package of 0.3cc syringes, and some pen needles to use the the lantus pens.   We reviewed the need to increase the dose of lantus insulin to 30 units as well as the need to take 6u of the R insulin before breakfast and lunch and 8u before supper. Typical day: 8AM befast of sweetened oatmeal, with banana 12:30 sandwich, with few chips and unsweet tea, or glass of milk 6PM: supper:  Meat-4-6 ounces, 1 starchy veg, and 2 nonstarchy veg., 1-2 rolls.  Water to drink.  Occasional desert--1-2 times/wk. No exercise due to blister on bottom of foot and weakness in leggs due to blood clot a few months ago.    Plan:  Reduce the banana to 1/2 with breakfast.  Test blood sugar before lunch.  If high, increase breakfast dose from 6u to 7u. When eating add one extra unit for supper meal for each small scoup of incream, and 2 inch piece of cake and one for frosting.  Take 1 extra unit for every 3 pieces of candy.  Limit desert to no more than 1-2 times a week  Do chair exercises given to you.  Work up to 30 min. 4-5 days/wk.

## 2015-06-20 NOTE — Patient Instructions (Addendum)
Lantus 30 units daily  Reg insulin 6 units at bfst and lunch and 8 before supper  Stop Glipzide  Check blood sugars on waking up .. 3-4.. times a week Also check blood sugars about 2 hours after a meal and do this after different meals by rotation  Recommended blood sugar levels on waking up is 90-130 and about 2 hours after meal is 140-180 Please bring blood sugar monitor to each visit.

## 2015-06-20 NOTE — Progress Notes (Signed)
Patient ID: Thomas Wright, male   DOB: 04-Dec-1945, 69 y.o.   MRN: [id number]           Reason for Appointment: Follow-up for Type 2 Diabetes  Referring physician: Drema Dallas  History of Present Illness:          Diagnosis: Type 2 diabetes mellitus, date of diagnosis:1993        Past history:  He has been on metformin and glipizide for several years with poor control. Records are available only until 2013 and his A1c has been consistently over 10% In 10/2013 his A1c was 14% He has usually not been checking his blood sugar at home  Recent history:  He was seen for initial consultation for poorly controlled diabetes in 4/16 His Lantus was increased to 30 units and he was also started on Novolog insulin with his evening meal on his last visit However he has not come back for his follow-up as directed  His A1c is still very high at 11%  Current blood sugar patterns and problems identified:  He has difficulty affording his insulin and may not have taken his Lantus consistently  Also he has not taken the full dose of 30 units of the Lantus and is taking only 25 units  Even with starting 6 units of Novolog at suppertime his blood sugars are mostly high after supper and not appearing to be improved  He ran out of his Novolog about 2 weeks ago and cannot afford this  He says that his blood sugars are mostly high after meals but not clear how often he is checking them  He is not able to do much exercise and is still not able to lose weight Also did not bring any blood sugar record or glucose meter for review He also has not seen the diabetes nurse educator as directed for general diabetes education and meal planning He does continue to be taking metformin 1 g twice a day and also glipizide       Oral hypoglycemic drugs the patient is taking are:   metformin 1 g twice a day, glipizide 5 mg twice a day     Side effects from medications have been: None recently  INSULIN regimen is described  as:  LANTUS 25 units at bedtime, Novolog 6 acs Compliance with the medical regimen: improving Hypoglycemia: Never    Glucose monitoring:  done one time a day         Glucometer: One touch     Blood Glucose readings by recall:   PRE-MEAL Fasting Lunch Dinner Bedtime Overall  Glucose range: 120-170   170-220   Mean/median:        POST-MEAL PC Breakfast PC Lunch PC Dinner  Glucose range: 180 170   Mean/median:         Self-care: The diet that the patient has been following is: tries to limit high-fat foods .     Meals: 3 meals per day. Breakfast is oatmeal or eggs; some sweet snacks at time         Exercise:    unable to do much because of leg and back pain, walks at work        Dietician visit, most recent: ?  2005              Weight history: in 2013 was 306 lbs, lowest weight recently 201  Wt Readings from Last 3 Encounters:  06/20/15 252 lb (114.306 kg)  06/10/15 248 lb (112.492 kg)  05/17/15 250 lb 12.8 oz (113.762 kg)    Glycemic control:   Lab Results  Component Value Date   HGBA1C 11.0 06/20/2015   HGBA1C 12.1* 11/24/2014   HGBA1C 11.8* 07/21/2014   Lab Results  Component Value Date   MICROALBUR 11.3* 05/24/2015   LDLCALC 64 05/24/2015   CREATININE 1.07 05/24/2015        Medication List       This list is accurate as of: 06/20/15 10:30 AM.  Always use your most recent med list.               amiodarone 200 MG tablet  Commonly known as:  PACERONE  Take 200 mg by mouth daily. One daily     apixaban 5 MG Tabs tablet  Commonly known as:  ELIQUIS  Take 1 tablet (5 mg total) by mouth 2 (two) times daily.     aspirin EC 81 MG tablet  Take 1 tablet (81 mg total) by mouth daily.     atorvastatin 10 MG tablet  Commonly known as:  LIPITOR  Take 1 tablet (10 mg total) by mouth every morning.     carvedilol 25 MG tablet  Commonly known as:  COREG  Take 0.5 tablets (12.5 mg total) by mouth 2 (two) times daily with a meal. Take 1/2 tablet twice daily       furosemide 20 MG tablet  Commonly known as:  LASIX  Take 1 tablet (20 mg total) by mouth daily.     guaiFENesin-dextromethorphan 100-10 MG/5ML syrup  Commonly known as:  ROBITUSSIN DM  Take 5 mLs by mouth at bedtime as needed for cough.     insulin glargine 100 UNIT/ML injection  Commonly known as:  LANTUS  Inject 0.25 mLs (25 Units total) into the skin at bedtime.     metFORMIN 1000 MG tablet  Commonly known as:  GLUCOPHAGE  Take 1,000 mg by mouth 2 (two) times daily with a meal.     multivitamin tablet  Take 1 tablet by mouth daily.        Allergies:  Allergies  Allergen Reactions  . Amoxicill-Clarithro-Omeprazole Diarrhea and Nausea And Vomiting    Past Medical History  Diagnosis Date  . Hypertension   . PAF (paroxysmal atrial fibrillation) ([place])     a. Postop 2008. b. recurrent afib 08/2014 with LAA clot noted on TEE. EF was down again at 25%. He was anticoagulated with Eliquis and placed on Amiodarone.He ultimately underwent TEE-DCCV 08/2014.  Marland Kitchen Hypertriglyceridemia   . Cardiomyopathy ([place])     EF originally 20%-now 45%, no CAD on cath  . Chronic combined systolic and diastolic CHF (congestive heart failure) ([place])     a. 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 Reported h/o cath in 2008 without significant CAD.  Marland Kitchen Type II diabetes mellitus ([place])   . Arthritis     .  Marland Kitchen Depression     "a little right now" (04/19/2015)  . S/P aortic valve replacement with bioprosthetic valve     a. bioprosthetic AVR in 05/2007 in [place].  . Hyperlipidemia     Past Surgical History  Procedure Laterality Date  . Aortic valve replacement  05/2007    with tissue graft  . I&d extremity Right 05/05/2014    Procedure: IRRIGATION AND DEBRIDEMENT EXTREMITY;  Surgeon: [name], MD;  Location: [facility];  Service: Orthopedics;  Laterality: Right;  .  Tee without cardioversion N/A 07/23/2014    Procedure:  TRANSESOPHAGEAL ECHOCARDIOGRAM (TEE);  Surgeon: Josue Hector, MD;  Location: [facility] ENDOSCOPY;  Service: Cardiovascular;  Laterality: N/A;  . Cardioversion N/A 07/23/2014    Procedure: CARDIOVERSION;  Surgeon: Josue Hector, MD;  Location: [facility] ENDOSCOPY;  Service: Cardiovascular;  Laterality: N/A;  . Tee without cardioversion N/A 09/01/2014    Procedure: TRANSESOPHAGEAL ECHOCARDIOGRAM (TEE);  Surgeon: [name], MD;  Location: [facility] ENDOSCOPY;  Service: Cardiovascular;  Laterality: N/A;  . Cardioversion N/A 09/01/2014    Procedure: CARDIOVERSION;  Surgeon: [name], MD;  Location: [facility] ENDOSCOPY;  Service: Cardiovascular;  Laterality: N/A;  . Amputation Right 07/29/2014    Procedure: AMPUTATION RIGHT LONG FINGER;  Surgeon: [name], MD;  Location: [place];  Service: Orthopedics;  Laterality: Right;  . Cardiac valve replacement    . Tonsillectomy  1954  . Cardiac catheterization  "several"    Family History  Problem Relation Age of Onset  . Colon cancer Father   . Diabetes Father   . Liver disease Brother   . Heart murmur Child   . Congenital heart disease Other   . Diabetes Maternal Grandmother   . Heart disease Maternal Grandfather     A Fib    Social History:  reports that he has never smoked. He has never used smokeless tobacco. He reports that he drinks alcohol. He reports that he does not use illicit drugs.    Review of Systems    Lipid history: LDL his control, HDL .low       Lab Results  Component Value Date   CHOL 127 05/24/2015   HDL 38.70* 05/24/2015   LDLCALC 64 05/24/2015   TRIG 123.0 05/24/2015   CHOLHDL 3 05/24/2015           Hypertension: mild and treated with low-dose Coreg alone.     he has seen his cardiologist , No new medications recently  He was recommended starting Avapro on his last visit  Complications from diabetes: Peripheral neuropathy, neurotrophic diabetic ulcers, history of microalbuminuria, erectile dysfunction  also has history of  diabetic foot ulcer  Neuropathy: Has had some numbness   Physical Examination:   BP 148/94 mmHg  Pulse 82  Temp(Src) 98.2 F (36.8 C)  Resp 16  Ht 6' (1.829 m)  Wt 252 lb (114.306 kg)  BMI 34.17 kg/m2  SpO2 98%       ASSESSMENT:  Diabetes type 2, uncontrolled    He has had persistently poor control of his diabetes for at least 3 years   He has been noncompliant with follow-up and A1c still high at 11%. He did not follow-up after starting small doses of Novolog insulin as a trial at suppertime  Blood sugars are not any better including fasting  Also taking less insulin than prescribed for Lantus    Currently does have difficulty  Affording brand name insulin doses except for LANTUS which she can continue once a day at night   Hyperlipidemia: His LDL has been better controlled with adding Lipitor  Mild hypertension: Currently not well controlled , followed by PCP and cardiologist  PLAN:   Discussed  Actions of mealtime insulin and basal insulin   He will need to take Regular Insulin instead of Novolog because of the high cost of Novolog for him   will start him on small doses with every meal now with the syringe.  He will be instructed by nurse educator on how to use a syringe.  He can continue Lantus but increase the dose to 30 for now; discussed that he may need to reduce the dose of fasting readings start coming down   He will also have general diabetes education from nurse educator.   Discussed timing and targets of blood sugars and he will need to bring his monitor on each visit    consider adding ACE inhibitor or ARB drug , do not see any contraindication  Patient Instructions  Lantus 30 units daily  Reg insulin 6 units at bfst and lunch and 8 before supper  Stop Glipzide  Check blood sugars on waking up .. 3-4.. times a week Also check blood sugars about 2 hours after a meal and do this after different meals by rotation  Recommended blood sugar levels  on waking up is 90-130 and about 2 hours after meal is 140-180 Please bring blood sugar monitor to each visit.      Counseling time on subjects discussed above is over 50% of today's 25 minute visit  Lydian Chavous 06/20/2015, 10:30 AM   Note: This office note was prepared with Estate agent. Any transcriptional errors that result from this process are unintentional.

## 2015-06-20 NOTE — Patient Instructions (Signed)
Reduce the banana to 1/2 with breakfast.  Test blood sugar before lunch.  If high, increase breakfast dose from 6u to 7u. When eating add one extra unit for supper meal for each small scoup of incream, and 2 inch piece of cake and one for frosting.  Take 1 extra unit for every 3 pieces of candy.  Limit desert to no more than 1-2 times a week  Do chair exercises given to you.  Work up to 30 min. 4-5 days/wk.

## 2015-06-21 ENCOUNTER — Encounter (HOSPITAL_BASED_OUTPATIENT_CLINIC_OR_DEPARTMENT_OTHER): Payer: Commercial Managed Care - HMO | Attending: General Surgery

## 2015-06-21 DIAGNOSIS — L97411 Non-pressure chronic ulcer of right heel and midfoot limited to breakdown of skin: Secondary | ICD-10-CM | POA: Insufficient documentation

## 2015-06-21 DIAGNOSIS — E1140 Type 2 diabetes mellitus with diabetic neuropathy, unspecified: Secondary | ICD-10-CM | POA: Insufficient documentation

## 2015-06-21 DIAGNOSIS — E11621 Type 2 diabetes mellitus with foot ulcer: Secondary | ICD-10-CM | POA: Insufficient documentation

## 2015-06-21 DIAGNOSIS — L97211 Non-pressure chronic ulcer of right calf limited to breakdown of skin: Secondary | ICD-10-CM | POA: Insufficient documentation

## 2015-06-23 DIAGNOSIS — S91329A Laceration with foreign body, unspecified foot, initial encounter: Secondary | ICD-10-CM | POA: Diagnosis not present

## 2015-06-28 DIAGNOSIS — E11621 Type 2 diabetes mellitus with foot ulcer: Secondary | ICD-10-CM | POA: Diagnosis not present

## 2015-06-28 DIAGNOSIS — E1140 Type 2 diabetes mellitus with diabetic neuropathy, unspecified: Secondary | ICD-10-CM | POA: Diagnosis not present

## 2015-06-28 DIAGNOSIS — L97411 Non-pressure chronic ulcer of right heel and midfoot limited to breakdown of skin: Secondary | ICD-10-CM | POA: Diagnosis not present

## 2015-06-28 DIAGNOSIS — L97211 Non-pressure chronic ulcer of right calf limited to breakdown of skin: Secondary | ICD-10-CM | POA: Diagnosis not present

## 2015-07-05 DIAGNOSIS — L97211 Non-pressure chronic ulcer of right calf limited to breakdown of skin: Secondary | ICD-10-CM | POA: Diagnosis not present

## 2015-07-05 DIAGNOSIS — L97411 Non-pressure chronic ulcer of right heel and midfoot limited to breakdown of skin: Secondary | ICD-10-CM | POA: Diagnosis not present

## 2015-07-05 DIAGNOSIS — E11621 Type 2 diabetes mellitus with foot ulcer: Secondary | ICD-10-CM | POA: Diagnosis not present

## 2015-07-05 DIAGNOSIS — E1140 Type 2 diabetes mellitus with diabetic neuropathy, unspecified: Secondary | ICD-10-CM | POA: Diagnosis not present

## 2015-07-06 DIAGNOSIS — S91329A Laceration with foreign body, unspecified foot, initial encounter: Secondary | ICD-10-CM | POA: Diagnosis not present

## 2015-07-12 DIAGNOSIS — E1140 Type 2 diabetes mellitus with diabetic neuropathy, unspecified: Secondary | ICD-10-CM | POA: Diagnosis not present

## 2015-07-12 DIAGNOSIS — L97211 Non-pressure chronic ulcer of right calf limited to breakdown of skin: Secondary | ICD-10-CM | POA: Diagnosis not present

## 2015-07-12 DIAGNOSIS — E11621 Type 2 diabetes mellitus with foot ulcer: Secondary | ICD-10-CM | POA: Diagnosis not present

## 2015-07-12 DIAGNOSIS — L97411 Non-pressure chronic ulcer of right heel and midfoot limited to breakdown of skin: Secondary | ICD-10-CM | POA: Diagnosis not present

## 2015-07-18 ENCOUNTER — Ambulatory Visit: Payer: Commercial Managed Care - HMO | Admitting: Endocrinology

## 2015-07-19 ENCOUNTER — Encounter (HOSPITAL_BASED_OUTPATIENT_CLINIC_OR_DEPARTMENT_OTHER): Payer: Commercial Managed Care - HMO | Attending: General Surgery

## 2015-07-19 DIAGNOSIS — E1140 Type 2 diabetes mellitus with diabetic neuropathy, unspecified: Secondary | ICD-10-CM | POA: Diagnosis not present

## 2015-07-19 DIAGNOSIS — L97411 Non-pressure chronic ulcer of right heel and midfoot limited to breakdown of skin: Secondary | ICD-10-CM | POA: Insufficient documentation

## 2015-07-19 DIAGNOSIS — E11621 Type 2 diabetes mellitus with foot ulcer: Secondary | ICD-10-CM | POA: Diagnosis not present

## 2015-07-19 DIAGNOSIS — S91329A Laceration with foreign body, unspecified foot, initial encounter: Secondary | ICD-10-CM | POA: Diagnosis not present

## 2015-07-21 ENCOUNTER — Other Ambulatory Visit: Payer: Self-pay | Admitting: *Deleted

## 2015-07-21 ENCOUNTER — Telehealth: Payer: Self-pay | Admitting: Endocrinology

## 2015-07-21 MED ORDER — GLUCOSE BLOOD VI STRP: ORAL_STRIP | Status: DC

## 2015-07-21 NOTE — Telephone Encounter (Signed)
Patient called stating he would like a refill sent to his pharmacy   Rx: Accu check test strips   Pharmacy: Send to [place]  Need a rx sent to local pharmacy; patient is currently out   Please advise   Thank you

## 2015-07-21 NOTE — Telephone Encounter (Signed)
rxs sent

## 2015-08-02 ENCOUNTER — Telehealth: Payer: Self-pay | Admitting: Cardiology

## 2015-08-02 DIAGNOSIS — E11621 Type 2 diabetes mellitus with foot ulcer: Secondary | ICD-10-CM | POA: Diagnosis not present

## 2015-08-02 DIAGNOSIS — E1140 Type 2 diabetes mellitus with diabetic neuropathy, unspecified: Secondary | ICD-10-CM | POA: Diagnosis not present

## 2015-08-02 DIAGNOSIS — L97411 Non-pressure chronic ulcer of right heel and midfoot limited to breakdown of skin: Secondary | ICD-10-CM | POA: Diagnosis not present

## 2015-08-02 NOTE — Telephone Encounter (Signed)
Pt states his handicap placard is about to expire.  He would like a new one.  Advised I will forward to Dr Marlou Porch for review and approval.  Pt aware Dr Marlou Porch is working in the hospital this week and it will be atleast next week before it can be signed.

## 2015-08-02 NOTE — Telephone Encounter (Signed)
New message     Patient needs an update handicap sticker

## 2015-08-04 NOTE — Telephone Encounter (Signed)
Handicap placard filled out and on Dr Marlou Porch cart to be signed next time he is in the office.

## 2015-08-04 NOTE — Telephone Encounter (Signed)
I am fine with this  [name], MD

## 2015-08-08 ENCOUNTER — Ambulatory Visit: Payer: Commercial Managed Care - HMO | Admitting: Endocrinology

## 2015-08-09 DIAGNOSIS — E1140 Type 2 diabetes mellitus with diabetic neuropathy, unspecified: Secondary | ICD-10-CM | POA: Diagnosis not present

## 2015-08-09 DIAGNOSIS — L97411 Non-pressure chronic ulcer of right heel and midfoot limited to breakdown of skin: Secondary | ICD-10-CM | POA: Diagnosis not present

## 2015-08-09 DIAGNOSIS — E11621 Type 2 diabetes mellitus with foot ulcer: Secondary | ICD-10-CM | POA: Diagnosis not present

## 2015-08-15 NOTE — Telephone Encounter (Signed)
Paperwork completed, signed and taken to MR to be scanned.  Pt will be called to come pick up.

## 2015-08-16 ENCOUNTER — Telehealth: Payer: Self-pay | Admitting: Cardiology

## 2015-08-16 NOTE — Telephone Encounter (Signed)
Left pt a message to call back. 

## 2015-08-16 NOTE — Telephone Encounter (Signed)
Called patient about his BP. Patient stated that his BP was 61/35 HR 70 after taking his coreg this morning. Patient stated before he took his morning medications his BP was 134/90 HR 64. Now patient's BP is at 116/73. Patient states that after taking his coreg that his BP just "drops to the bottom". Patient usually does not take his coreg if his SBP is below 120. Patient is taking coreg 12.5 mg by mouth twice daily. Will forward to Dr. Marlou Porch for advisement.

## 2015-08-16 NOTE — Telephone Encounter (Signed)
F/u  Pt returning Rn phone call./  

## 2015-08-16 NOTE — Telephone Encounter (Signed)
Pt c/o BP issue: STAT if pt c/o blurred vision, one-sided weakness or slurred speech  1. What are your last 5 BP readings? 110/65  120/78  HR 79  2. Are you having any other symptoms (ex. Dizziness, headache, blurred vision, passed out)? no  3. What is your BP issue?  UP AND DOWN   Pt states the BP medications is making it worse

## 2015-08-16 NOTE — Telephone Encounter (Signed)
Patient aware Disability Parking placard ready for pick up, left at front desk.

## 2015-08-17 MED ORDER — CARVEDILOL 6.25 MG PO TABS: 6.2500 mg | ORAL_TABLET | Freq: Two times a day (BID) | ORAL | Status: DC

## 2015-08-17 NOTE — Telephone Encounter (Signed)
Called patient back with Dr. Marlou Porch recommendations. Will send in prescription to patient's pharmacy of choice. Patient verbalized understanding.

## 2015-08-17 NOTE — Telephone Encounter (Signed)
Decrease coreg to 6.25mg  BID If continues to drop, may need further reduction. [name], MD

## 2015-08-19 ENCOUNTER — Telehealth: Payer: Self-pay | Admitting: Cardiology

## 2015-08-19 NOTE — Telephone Encounter (Signed)
Thomas Wright states patient is experiencing low blood pressure and does not feel well.   He just started feeling bad this morning.  Please call.

## 2015-08-19 NOTE — Telephone Encounter (Signed)
BP 125/63 HR 66 - advised these are good readings. Per wife pt is "not acting right". BS this am was 400 - is now 277.  Requested she call pt's PCP.  She stated she would.

## 2015-08-23 ENCOUNTER — Ambulatory Visit: Payer: Commercial Managed Care - HMO | Admitting: Endocrinology

## 2015-08-23 ENCOUNTER — Encounter (HOSPITAL_BASED_OUTPATIENT_CLINIC_OR_DEPARTMENT_OTHER): Payer: Commercial Managed Care - HMO | Attending: General Surgery

## 2015-08-23 ENCOUNTER — Ambulatory Visit (INDEPENDENT_AMBULATORY_CARE_PROVIDER_SITE_OTHER): Payer: Commercial Managed Care - HMO | Admitting: Cardiology

## 2015-08-23 ENCOUNTER — Encounter: Payer: Self-pay | Admitting: Cardiology

## 2015-08-23 VITALS — BP 128/76 | HR 74 | Ht 72.0 in | Wt 254.5 lb

## 2015-08-23 DIAGNOSIS — Z954 Presence of other heart-valve replacement: Secondary | ICD-10-CM

## 2015-08-23 DIAGNOSIS — I10 Essential (primary) hypertension: Secondary | ICD-10-CM | POA: Diagnosis not present

## 2015-08-23 DIAGNOSIS — I480 Paroxysmal atrial fibrillation: Secondary | ICD-10-CM

## 2015-08-23 DIAGNOSIS — E11621 Type 2 diabetes mellitus with foot ulcer: Secondary | ICD-10-CM | POA: Insufficient documentation

## 2015-08-23 DIAGNOSIS — E1140 Type 2 diabetes mellitus with diabetic neuropathy, unspecified: Secondary | ICD-10-CM | POA: Insufficient documentation

## 2015-08-23 DIAGNOSIS — L97411 Non-pressure chronic ulcer of right heel and midfoot limited to breakdown of skin: Secondary | ICD-10-CM | POA: Insufficient documentation

## 2015-08-23 DIAGNOSIS — Z952 Presence of prosthetic heart valve: Secondary | ICD-10-CM | POA: Insufficient documentation

## 2015-08-23 DIAGNOSIS — E669 Obesity, unspecified: Secondary | ICD-10-CM

## 2015-08-23 MED ORDER — FUROSEMIDE 20 MG PO TABS: 20.0000 mg | ORAL_TABLET | Freq: Every day | ORAL | Status: DC

## 2015-08-23 NOTE — Progress Notes (Signed)
Cardiology Office Note   Date:  08/23/2015   ID:  Thomas Wright, DOB 1946/08/18, MRN [id number]  PCP:  [name], MD  Cardiologist:   [name], MD       History of Present Illness: Thomas Wright is a 69 y.o. male who presents for follow-up of diastolic heart failure, chronic, hospitalization in early August 2016 with 20 pound weight gain. Ironically, his BNP was normal. No changes on chest CT scan to suggest amiodarone toxicity. He was down 3.4 L at discharge and was set up to have pulmonary function tests as outpatient. The pulmonary function studies demonstrated moderate to severe restriction consistent with fibrosis or interstitial inflammations. Pulmonary referral was made.  I reviewed pulmonary note. There was significant exposure in the 60s and 70s to cold dust. 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 Autoimmune serologies were drawn. Sedimentation rate was normal at 11.  Recent hypotension at home SBPs 50s-60s.  Lightheaded but no syncope, chest pain, palpitations, dyspnea.  He has increased his Lasix to 62m BID in the past two months.   He called in and was advised to decrease Coreg to 6.254mBID.  He has only been taking this when SBP > 125 and DBP > 85 he takes Coreg.  He has only been taking Coreg twice daily because BPs have been mostly lower than this.    His weight is up 10 pounds in past 4 months.  He reports abdominal distention but denies PND or LE edema.  He has stable 2 pillow orthopnea.  He reports early satiety in past year.  He doubles his Lasix about twice weekly when he feels his fluid is up (when it is difficult to button his pants).    Has not been walking as much due to DM ulcer on bottom of foot.  No fever/chills or systemic symptoms to suggest infection.  Recent MRI negative for osteo.  He is following with wound specialist for this.  Past Medical  History  Diagnosis Date  . Hypertension   . PAF (paroxysmal atrial fibrillation) ([facility]    a. Postop 2008. b. recurrent afib 08/2014 with LAA clot noted on TEE. EF was down again at 25%. He was anticoagulated with Eliquis and placed on Amiodarone.He ultimately underwent TEE-DCCV 08/2014.  . Marland Kitchenypertriglyceridemia   . Cardiomyopathy ([facility]    EF originally 20%-now 45%, no CAD on cath  . Chronic combined systolic and diastolic CHF (congestive heart failure) ([facility]    a. 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 Reported h/o cath in 2008 without significant CAD.  . Marland Kitchenype II diabetes mellitus ([facility]  . Arthritis     .  . Marland Kitchenepression     "a little right now" (04/19/2015)  . S/P aortic valve replacement with bioprosthetic valve     a. bioprosthetic AVR [place] . Hyperlipidemia     Past Surgical History  Procedure Laterality Date  . Aortic valve replacement  05/2007    with tissue graft  . I&d extremity Right 05/05/2014    Procedure: IRRIGATION AND DEBRIDEMENT EXTREMITY;  Surgeon: MaCharlotte CrumbMD;  [facility];  Laterality: Right;  . Tee without cardioversion N/A 07/23/2014    Procedure: TRANSESOPHAGEAL ECHOCARDIOGRAM (TEE);  Surgeon: PeJosue HectorMD;  [facility];  Service: Cardiovascular;  Laterality: N/A;  . Cardioversion N/A 07/23/2014    Procedure: CARDIOVERSION;  Surgeon: Josue Hector, MD;  Location: [facility] ENDOSCOPY;  Service: Cardiovascular;  Laterality: N/A;  . Tee without cardioversion N/A 09/01/2014    Procedure: TRANSESOPHAGEAL ECHOCARDIOGRAM (TEE);  Surgeon: [name], MD;  Location: [facility] ENDOSCOPY;  Service: Cardiovascular;  Laterality: N/A;  . Cardioversion N/A 09/01/2014    Procedure: CARDIOVERSION;  Surgeon: [name], MD;  Location: [facility] ENDOSCOPY;  Service: Cardiovascular;  Laterality: N/A;  . Amputation Right 07/29/2014    Procedure: AMPUTATION  RIGHT LONG FINGER;  Surgeon: [name], MD;  Location: [place];  Service: Orthopedics;  Laterality: Right;  . Cardiac valve replacement    . Tonsillectomy  1954  . Cardiac catheterization  "several"     Current Outpatient Prescriptions  Medication Sig Dispense Refill  . amiodarone (PACERONE) 200 MG tablet Take 200 mg by mouth daily. One daily    . apixaban (ELIQUIS) 5 MG TABS tablet Take 1 tablet (5 mg total) by mouth 2 (two) times daily. 60 tablet 3  . aspirin EC 81 MG tablet Take 1 tablet (81 mg total) by mouth daily.    Marland Kitchen atorvastatin (LIPITOR) 10 MG tablet Take 1 tablet (10 mg total) by mouth every morning. 90 tablet 3  . carvedilol (COREG) 6.25 MG tablet Take 1 tablet (6.25 mg total) by mouth 2 (two) times daily with a meal. 180 tablet 3  . DHA-EPA-Coenzyme Q10-Vitamin E (CO Q-10 VITAMIN E FISH OIL PO) Take 1,000 mg by mouth 2 (two) times daily.    . furosemide (LASIX) 20 MG tablet Take 1 tablet (20 mg total) by mouth daily. 30 tablet 3  . glucose blood (ACCU-CHEK SMARTVIEW) test strip Use as instructed to check blood sugar 4 times per day dx code E11.9 400 each 1  . guaiFENesin-dextromethorphan (ROBITUSSIN DM) 100-10 MG/5ML syrup Take 5 mLs by mouth at bedtime as needed for cough.    . insulin glargine (LANTUS) 100 UNIT/ML injection Inject 0.25 mLs (25 Units total) into the skin at bedtime. 10 mL 0  . insulin regular (NOVOLIN R RELION) 100 units/mL injection Inject 6 units before breakfast and lunch and 8 units before supper 10 mL 3  . Insulin Syringe-Needle U-100 31G X 5/16" 0.3 ML MISC Use 3 per day to inject insulin 100 each 3  . metFORMIN (GLUCOPHAGE) 1000 MG tablet Take 1,000 mg by mouth 2 (two) times daily with a meal.    . Multiple Vitamin (MULTIVITAMIN) tablet Take 1 tablet by mouth daily.     No current facility-administered medications for this visit.    Allergies:   Amoxicill-clarithro-omeprazole    Social History:  The patient  reports that he has never smoked. He  has never used smokeless tobacco. He reports that he drinks alcohol. He reports that he does not use illicit drugs.   Family History:  The patient's family history includes Colon cancer in his father; Congenital heart disease in his other; Diabetes in his father and maternal grandmother; Heart disease in his maternal grandfather; Heart murmur in his child; Liver disease in his brother.    ROS:  Please see the history of present illness.   Otherwise, review of systems are positive for none.   All other systems are reviewed and negative.    PHYSICAL EXAM: VS:  BP 128/76 mmHg  Pulse 74  Ht 6' (1.829 m)  Wt 254 lb 8 oz (115.44 kg)  BMI 34.51 kg/m2  SpO2  93% , BMI Body mass index is 34.51 kg/(m^2). GEN: Well nourished, well developed, in no acute distress HEENT: normal Neck: no JVD, carotid bruits, or masses Cardiac: RRR; no murmurs, rubs, or gallops,no edema  Respiratory:  clear to auscultation bilaterally, normal work of breathing GI: soft, nontender, mild distended, + BS MS: no deformity or atrophy Skin: warm and dry, no rash Neuro:  Grossly intact Psych: euthymic mood, full affect   EKG:  EKG is not ordered today.    Recent Labs: 04/19/2015: B Natriuretic Peptide 65.8 05/10/2015: ALT 17; Hemoglobin 16.7; Platelets 333.0; TSH 2.24 05/24/2015: BUN 19; Creatinine, Ser 1.07; Potassium 4.5; Sodium 136    Lipid Panel    Component Value Date/Time   CHOL 127 05/24/2015 0848   TRIG 123.0 05/24/2015 0848   HDL 38.70* 05/24/2015 0848   CHOLHDL 3 05/24/2015 0848   VLDL 24.6 05/24/2015 0848   LDLCALC 64 05/24/2015 0848      Wt Readings from Last 3 Encounters:  08/23/15 254 lb 8 oz (115.44 kg)  06/20/15 252 lb (114.306 kg)  06/10/15 248 lb (112.492 kg)      Other studies Reviewed: Additional studies/ records that were reviewed today include: labs, radiology, office notes. Review of the above records demonstrates: as above   ASSESSMENT AND PLAN:  1.  Chronic diastolic heart  failure-interestingly, his dyspnea did not change all that much in the hospital after diuresing 3-4 L. His BNP on admission was actually normal. Weight gain is the only indicator of potential excess fluid. However, he has no other S&S of HF.  We will continue his Lasix. He is unsure if hypotension occurred with increased Lasix so will continue with daily Lasix but he can increase to BID prn.  Continue lower dose of Coreg 6.39m daily since he feels better with this change.   2. Paroxysmal atrial fibrillation- In NSR. continue with amiodarone 267mdaily. Pulmonary did not feel as though his CT findings or lab work (ESR normal) were indicative of amiodarone toxicity causing his dyspnea. We will continue. He does well in normal rhythm.  3. Obesity-continue to encourage weight loss. He is hindered by a right foot ulcer which is hampering his exercise.  Recommend he follow-up with endocrine.     Current medicines are reviewed at length with the patient today.  The patient does not have concerns regarding medicines.  The following changes have been made:  Decrease amiodarone  Labs/ tests ordered today include: none  No orders of the defined types were placed in this encounter.     Disposition:   FU with [facility] in 3 months  Signed, SKCandee FurbishMD  08/23/2015 2:11 PM    [facility] [address]  [id number]: [phone]: [phone]

## 2015-08-23 NOTE — Patient Instructions (Signed)

## 2015-08-30 DIAGNOSIS — Z952 Presence of prosthetic heart valve: Secondary | ICD-10-CM | POA: Diagnosis not present

## 2015-08-30 DIAGNOSIS — E11621 Type 2 diabetes mellitus with foot ulcer: Secondary | ICD-10-CM | POA: Diagnosis not present

## 2015-08-30 DIAGNOSIS — L97411 Non-pressure chronic ulcer of right heel and midfoot limited to breakdown of skin: Secondary | ICD-10-CM | POA: Diagnosis not present

## 2015-08-30 DIAGNOSIS — E1140 Type 2 diabetes mellitus with diabetic neuropathy, unspecified: Secondary | ICD-10-CM | POA: Diagnosis not present

## 2015-08-30 DIAGNOSIS — S91329A Laceration with foreign body, unspecified foot, initial encounter: Secondary | ICD-10-CM | POA: Diagnosis not present

## 2015-08-31 ENCOUNTER — Ambulatory Visit: Payer: Commercial Managed Care - HMO | Admitting: Endocrinology

## 2015-09-13 DIAGNOSIS — E1140 Type 2 diabetes mellitus with diabetic neuropathy, unspecified: Secondary | ICD-10-CM | POA: Diagnosis not present

## 2015-09-13 DIAGNOSIS — L97411 Non-pressure chronic ulcer of right heel and midfoot limited to breakdown of skin: Secondary | ICD-10-CM | POA: Diagnosis not present

## 2015-09-13 DIAGNOSIS — Z952 Presence of prosthetic heart valve: Secondary | ICD-10-CM | POA: Diagnosis not present

## 2015-09-13 DIAGNOSIS — E11621 Type 2 diabetes mellitus with foot ulcer: Secondary | ICD-10-CM | POA: Diagnosis not present

## 2015-09-21 ENCOUNTER — Ambulatory Visit: Payer: Commercial Managed Care - HMO | Admitting: Endocrinology

## 2015-09-23 ENCOUNTER — Other Ambulatory Visit: Payer: Self-pay | Admitting: *Deleted

## 2015-09-23 MED ORDER — INSULIN GLARGINE 100 UNIT/ML ~~LOC~~ SOLN: 25.0000 [IU] | Freq: Every day | SUBCUTANEOUS | Status: DC

## 2015-09-28 DIAGNOSIS — S91329A Laceration with foreign body, unspecified foot, initial encounter: Secondary | ICD-10-CM | POA: Diagnosis not present

## 2015-10-04 ENCOUNTER — Encounter (HOSPITAL_BASED_OUTPATIENT_CLINIC_OR_DEPARTMENT_OTHER): Payer: Commercial Managed Care - HMO | Attending: Internal Medicine

## 2015-10-19 ENCOUNTER — Encounter (HOSPITAL_BASED_OUTPATIENT_CLINIC_OR_DEPARTMENT_OTHER): Payer: Commercial Managed Care - HMO | Attending: Surgery

## 2015-10-19 DIAGNOSIS — E1140 Type 2 diabetes mellitus with diabetic neuropathy, unspecified: Secondary | ICD-10-CM | POA: Diagnosis not present

## 2015-10-19 DIAGNOSIS — L97411 Non-pressure chronic ulcer of right heel and midfoot limited to breakdown of skin: Secondary | ICD-10-CM | POA: Diagnosis not present

## 2015-10-19 DIAGNOSIS — E11621 Type 2 diabetes mellitus with foot ulcer: Secondary | ICD-10-CM | POA: Diagnosis not present

## 2015-10-19 DIAGNOSIS — L97511 Non-pressure chronic ulcer of other part of right foot limited to breakdown of skin: Secondary | ICD-10-CM | POA: Diagnosis not present

## 2015-10-19 DIAGNOSIS — Z952 Presence of prosthetic heart valve: Secondary | ICD-10-CM | POA: Diagnosis not present

## 2015-10-25 ENCOUNTER — Telehealth: Payer: Self-pay | Admitting: Cardiology

## 2015-10-25 NOTE — Telephone Encounter (Signed)
New message     Sometimes pt "taste" blood in his mouth.  When he spits---there is no blood in his mouth.  He also has a little of blood in his stool---very small amount.  Pt is on eliquis 5mg .  Pt noticed this starting last thurs or fri.  Please advise

## 2015-10-25 NOTE — Telephone Encounter (Signed)
Pt states for the last 3-4 days he has noticed fresh blood on the tissue after a bowel movement.  Pt states it is a very small amount.  Pt states he had hemorrhoids years ago but not recently. Pt states he has noticed a taste of blood in his mouth but no signs of blood when he spits.  Pt states he does have a cavity that needs a filling so it could be related to that.  Pt is on eliquis. Pt advised I will forward to Dr Marlou Porch for review.

## 2015-10-26 ENCOUNTER — Encounter (HOSPITAL_BASED_OUTPATIENT_CLINIC_OR_DEPARTMENT_OTHER): Payer: Commercial Managed Care - HMO

## 2015-10-26 DIAGNOSIS — L97511 Non-pressure chronic ulcer of other part of right foot limited to breakdown of skin: Secondary | ICD-10-CM | POA: Diagnosis not present

## 2015-10-26 DIAGNOSIS — E1140 Type 2 diabetes mellitus with diabetic neuropathy, unspecified: Secondary | ICD-10-CM | POA: Diagnosis not present

## 2015-10-26 DIAGNOSIS — Z952 Presence of prosthetic heart valve: Secondary | ICD-10-CM | POA: Diagnosis not present

## 2015-10-26 DIAGNOSIS — L97411 Non-pressure chronic ulcer of right heel and midfoot limited to breakdown of skin: Secondary | ICD-10-CM | POA: Diagnosis not present

## 2015-10-26 DIAGNOSIS — E11621 Type 2 diabetes mellitus with foot ulcer: Secondary | ICD-10-CM | POA: Diagnosis not present

## 2015-10-26 NOTE — Telephone Encounter (Signed)
Viagra 100mg  Can cut in half  6 tabs 12 refills  [name], MD

## 2015-10-26 NOTE — Telephone Encounter (Signed)
Please have him come in for CBC  [name], MD

## 2015-10-26 NOTE — Telephone Encounter (Signed)
Spoke with pt who is reporting he has noticed less blood.  He is aware Dr Marlou Porch is requesting he come in for CBC.  He states he will come in one day next week and will call back to scheduled. Pt is also asking the use of medications for erectile dysfunction and if it would be OK for him to use these medications given his history.  He has taken Viagra and Cialis both in the past.  He reports he would prefer Viagra if OK to use.  Advised I will forward to Dr Marlou Porch for review and will c/b to let him know when I have an answer.

## 2015-10-27 MED ORDER — SILDENAFIL CITRATE 100 MG PO TABS: 100.0000 mg | ORAL_TABLET | Freq: Every day | ORAL | Status: DC | PRN

## 2015-10-28 ENCOUNTER — Telehealth: Payer: Self-pay | Admitting: Cardiology

## 2015-10-28 DIAGNOSIS — Z79899 Other long term (current) drug therapy: Secondary | ICD-10-CM

## 2015-10-28 DIAGNOSIS — I480 Paroxysmal atrial fibrillation: Secondary | ICD-10-CM

## 2015-10-28 NOTE — Telephone Encounter (Signed)
Order placed and appt made.

## 2015-10-28 NOTE — Telephone Encounter (Signed)
New message      Calling to let the nurse know that he will come by on 11-02-15 for labs.  There was no order in the computer but he said he already talked to the nurse about this

## 2015-11-01 ENCOUNTER — Other Ambulatory Visit: Payer: Self-pay | Admitting: Surgery

## 2015-11-01 ENCOUNTER — Ambulatory Visit (HOSPITAL_COMMUNITY)
Admission: RE | Admit: 2015-11-01 | Discharge: 2015-11-01 | Disposition: A | Discharge status: Home / Self Care | Payer: Commercial Managed Care - HMO | Source: Ambulatory Visit | Attending: Surgery | Admitting: Surgery

## 2015-11-01 DIAGNOSIS — M7989 Other specified soft tissue disorders: Secondary | ICD-10-CM | POA: Diagnosis not present

## 2015-11-01 DIAGNOSIS — M8580 Other specified disorders of bone density and structure, unspecified site: Secondary | ICD-10-CM | POA: Diagnosis not present

## 2015-11-01 DIAGNOSIS — M869 Osteomyelitis, unspecified: Secondary | ICD-10-CM

## 2015-11-01 DIAGNOSIS — E11621 Type 2 diabetes mellitus with foot ulcer: Secondary | ICD-10-CM | POA: Diagnosis not present

## 2015-11-01 DIAGNOSIS — L97519 Non-pressure chronic ulcer of other part of right foot with unspecified severity: Secondary | ICD-10-CM | POA: Insufficient documentation

## 2015-11-01 DIAGNOSIS — M86171 Other acute osteomyelitis, right ankle and foot: Secondary | ICD-10-CM | POA: Diagnosis not present

## 2015-11-01 DIAGNOSIS — S91301A Unspecified open wound, right foot, initial encounter: Secondary | ICD-10-CM | POA: Diagnosis not present

## 2015-11-02 ENCOUNTER — Other Ambulatory Visit (INDEPENDENT_AMBULATORY_CARE_PROVIDER_SITE_OTHER): Payer: Commercial Managed Care - HMO | Admitting: *Deleted

## 2015-11-02 DIAGNOSIS — I4891 Unspecified atrial fibrillation: Secondary | ICD-10-CM | POA: Diagnosis not present

## 2015-11-02 DIAGNOSIS — Z79899 Other long term (current) drug therapy: Secondary | ICD-10-CM | POA: Diagnosis not present

## 2015-11-02 DIAGNOSIS — I480 Paroxysmal atrial fibrillation: Secondary | ICD-10-CM | POA: Diagnosis not present

## 2015-11-02 LAB — CBC
HCT: 45.5 % (ref 39.0–52.0)
Hemoglobin: 15.3 g/dL (ref 13.0–17.0)
MCH: 28.5 pg (ref 26.0–34.0)
MCHC: 33.6 g/dL (ref 30.0–36.0)
MCV: 84.9 fL (ref 78.0–100.0)
MPV: 10.3 fL (ref 8.6–12.4)
PLATELETS: 289 10*3/uL (ref 150–400)
RBC: 5.36 MIL/uL (ref 4.22–5.81)
RDW: 13.9 % (ref 11.5–15.5)
WBC: 9.6 10*3/uL (ref 4.0–10.5)

## 2015-11-18 ENCOUNTER — Other Ambulatory Visit: Payer: Self-pay

## 2015-11-18 DIAGNOSIS — E1110 Type 2 diabetes mellitus with ketoacidosis without coma: Secondary | ICD-10-CM

## 2015-11-18 NOTE — Patient Outreach (Signed)
[facility]) Care Management  11/18/2015  Thomas Wright [birth year] [id number]   Referral Date:  11/09/2015 Source:  [facility] HMO tier 4 Issue:  DM, CHF, 3 ED visits and no admits.   Outreach call to patient.  Patient reached.  Screening completed.   Providers: Primary MD:  Dr. Drema Dallas -  last appt: 6 months ago / next appt:  Was this week but cancelled due to work issues and things he needed to get done; states plan to reschedule.   HH:  None Insurance:  [employer]   Social: Patient lives in his home and planning to move back to [place] in March or April.  Mobility:  Ambulating with a cane at times.  Falls: none  Transportation:  Self Caregiver:  Yes  DME: cane, CBG meter, home scales, wrist BP monitor   THN conditions:  DM, CHF Admissions / ER visits:  None in last 6 months confirmed.  States DM and CHF are stable but agreed to [employer] for education.   Medications:  Patient taking more than 10 medications  Co-pay cost issues: none  Flu Vaccine: 06/14/2015 PCV13:  N/D per [facility] PPSV2: 07/22/2014 Weight:  239 Home scales BP:  116/73 home wrist BP monitor.   Consent: Patient agreed to [facility] services.   Plan: [place] Coach Referral  -Disease Management    [facility] Pharm[facility] Referral -more than 10 medications  RN CM advised in next scheduled contact call within the next 30 days (health coach services).  RN CM advised to please notify MD of any changes in condition prior to scheduled appt's.   RN CM provided contact name and # [phone] or main office # [phone] and 24-hour nurse line # [phone].  RN CM confirmed patient is aware of 911 services for urgent emergency needs.  [name], RN, BSN, [facility], CCM  [employer] Management Coordinator [phone] Direct [phone] Cell [phone] Office [phone] Fax

## 2015-11-24 ENCOUNTER — Other Ambulatory Visit: Payer: Self-pay

## 2015-11-24 NOTE — Patient Outreach (Signed)
11/24/15  Subjective Thomas Wright is a 70 year old male with a past medical history of Paroxysmal atrial fib, chronic diastolic heart failure (EF >55%), Type 2 diabetes, obesity, hypertension, and hyperlipidemia.  He was referred to pharmacy for medication reconciliation and review.  He has not had any recent hospitalization or ED visits.  He has agreed to disease management with a Engineer, maintenance.  He is not having any issues with his medications.  His last A1c per [facility] report was 11% on 06/2015.  Objective Outpatient Encounter Prescriptions as of 11/24/2015  Medication Sig  . amiodarone (PACERONE) 200 MG tablet Take 200 mg by mouth daily. One daily  . apixaban (ELIQUIS) 5 MG TABS tablet Take 1 tablet (5 mg total) by mouth 2 (two) times daily.  Marland Kitchen aspirin EC 81 MG tablet Take 1 tablet (81 mg total) by mouth daily.  Marland Kitchen atorvastatin (LIPITOR) 10 MG tablet Take 1 tablet (10 mg total) by mouth every morning.  . carvedilol (COREG) 6.25 MG tablet Take 1 tablet (6.25 mg total) by mouth 2 (two) times daily with a meal.  . DHA-EPA-Coenzyme Q10-Vitamin E (CO Q-10 VITAMIN E FISH OIL PO) Take 1,000 mg by mouth 2 (two) times daily.  . furosemide (LASIX) 20 MG tablet Take 1 tablet (20 mg total) by mouth daily.  Marland Kitchen glucose blood (ACCU-CHEK SMARTVIEW) test strip Use as instructed to check blood sugar 4 times per day dx code E11.9  . guaiFENesin-dextromethorphan (ROBITUSSIN DM) 100-10 MG/5ML syrup Take 5 mLs by mouth at bedtime as needed for cough.  . insulin glargine (LANTUS) 100 UNIT/ML injection Inject 0.25 mLs (25 Units total) into the skin at bedtime.  . insulin regular (NOVOLIN R RELION) 100 units/mL injection Inject 6 units before breakfast and lunch and 8 units before supper  . Insulin Syringe-Needle U-100 31G X 5/16" 0.3 ML MISC Use 3 per day to inject insulin  . metFORMIN (GLUCOPHAGE) 1000 MG tablet Take 1,000 mg by mouth 2 (two) times daily with a meal.  . Multiple Vitamin (MULTIVITAMIN) tablet Take 1 tablet by  mouth daily.  . sildenafil (VIAGRA) 100 MG tablet Take 1 tablet (100 mg total) by mouth daily as needed for erectile dysfunction (1 hr prior to sexual activity).   No facility-administered encounter medications on file as of 11/24/2015.   Assessment  Drugs sorted by system:  Neurologic/Psychologic: none  Cardiovascular: amiodarone, furosemide, carvedilol, atorvastatin, Eliquis, aspirin  Pulmonary/Allergy: guaifenesin-dextromethorphan,  Gastrointestinal: none  Endocrine: Lantus, Novolin R, metformin  Renal: none  Topical: none  Pain: none  Vitamins/Minerals: DHA-EPA-Coenzyme Q10, multivitamin  Infectious Diseases: none  Miscellaneous: sildenafil   Duplications in therapy: none Gaps in therapy: Not on an ACE inhibitor or ARB due to dropping blood pressure in the past Medications to avoid in the elderly: none Drug interactions: Eliquis and aspirin may increase risk of bleeding Other issues noted: none:  His GFR is appropriate for metformin, he is on a beta blocker; his A1c is not below 7%  Plan 1.  Thomas Wright in currently Not on an ACE inhibitor or ARB due to dropping blood pressure in the past.  He most likely is not a candidate for being on one.   2.  He is on Eliquis and Aspirin which will place him at a higher risk of bleeding.  He will need to watch for signs and symptoms of bleeding which he has been educated on in the past.  He has called the cardiologist when he begins to have bleeding symptoms.  3.  His last A1c was 11% which is not below the recommended ADA goal of less than 7%.  I will reach out to the Health Coach and make sure she focuses her discussions on Diabetes management.   4.  I will close Thomas Wright to pharmacy since all his pharmacy needs have been met.  I am happy to follow up in the future if any further pharmacy issues arise.    Deanne Coffer, PharmD, [place] [phone]

## 2015-11-25 ENCOUNTER — Other Ambulatory Visit: Payer: Self-pay

## 2015-11-25 DIAGNOSIS — M791 Myalgia: Secondary | ICD-10-CM | POA: Diagnosis not present

## 2015-11-25 DIAGNOSIS — J069 Acute upper respiratory infection, unspecified: Secondary | ICD-10-CM | POA: Diagnosis not present

## 2015-11-25 NOTE — Patient Outreach (Signed)
[facility]) Care Management  11/25/2015  Thomas Wright July 19, 1946 [id number]   Telephone call to patient for initial assessment.  Patient reports he is not feeling well and is not up to talking.  Patient also reports he has a doctor's appointment this afternoon to get checked out.  Advised patient that I would call another time.  He verbalized understanding.    Plan: RN Health Coach will outreach patient within 1-2 weeks.  Thomas Baseman, RN, MSN Thomas Wright [phone]

## 2015-12-05 ENCOUNTER — Other Ambulatory Visit: Payer: Self-pay

## 2015-12-05 NOTE — Patient Outreach (Signed)
[facility]) Care Management  12/05/2015  WAYNE SIEMON 05/23/1946 [id number]   Telephone call to patient to complete initial assessment.  No answer and voicemail full.    Plan: RN Health Coach will attempt patient within 1-2 weeks.    [name], RN, MSN [place] [phone]

## 2015-12-09 ENCOUNTER — Other Ambulatory Visit: Payer: Self-pay

## 2015-12-09 NOTE — Patient Outreach (Signed)
[place] [facility]) Care Management  12/09/2015  Thomas Wright 04-08-1946 [id number]   Telephone call to patient for initial assessment.  No answer.  States mailbox full.    Plan: RN Health Coach will attempt again in 1-2 weeks.    [name], RN, MSN [place] [phone]

## 2015-12-09 NOTE — Patient Outreach (Signed)
[facility]) Care Management  12/09/2015  Thomas Wright [birth year] [id number]   Return telephone call to patient as requested for initial assessment.  Patient states he is in the process of moving and is busy.  Advised patient that we could talk another time.  Patient is agreeable and states he wants to get settled in his new place.  Patient states that he would call next week.    Plan: RN Health Coach will await patient return call next week. If no return call next week.  RN Health Coach will send letter in attempt to reach.    [name], RN, MSN [place] [phone]

## 2015-12-13 ENCOUNTER — Ambulatory Visit: Payer: Commercial Managed Care - HMO | Admitting: Pulmonary Disease

## 2015-12-29 ENCOUNTER — Ambulatory Visit: Payer: Commercial Managed Care - HMO | Admitting: Nurse Practitioner

## 2016-01-04 ENCOUNTER — Other Ambulatory Visit: Payer: Self-pay

## 2016-01-04 DIAGNOSIS — Z794 Long term (current) use of insulin: Principal | ICD-10-CM

## 2016-01-04 DIAGNOSIS — E118 Type 2 diabetes mellitus with unspecified complications: Secondary | ICD-10-CM

## 2016-01-04 NOTE — Addendum Note (Signed)
Addended by: [name] on: 01/04/2016 03:28 PM   Modules accepted: Orders

## 2016-01-04 NOTE — Patient Outreach (Signed)
[facility]) Care Management  [place]  01/04/2016   Thomas Wright 11-12-1945 [id number]  Subjective: Telephone call to patient for initial assessment.  Patient reports he has moved but still getting unpacked.  Patient reports some weakness and tiredness from the work of moving. Patient reports he suffered a cut to his foot but he is taking care of it and that it is healing.  Discussed with patient the importance of being attentive to his feet and taking care of them.  Patient reports last A1c was 11.0 and he knows he needs to get it down.  Discussed with patient carbohydrates and importance of cutting carbohydrates.  He verbalized understanding.  Patient asked about resources for housekeeping and ramp as his trailer has steep steps and he has a hard time with them.  Advised patient that social worker could possibly help in this area.  Patient agreeable to referral. No other concerns.   Objective:   Encounter Medications:  Outpatient Encounter Prescriptions as of 01/04/2016  Medication Sig  . amiodarone (PACERONE) 200 MG tablet Take 200 mg by mouth daily. One daily  . apixaban (ELIQUIS) 5 MG TABS tablet Take 1 tablet (5 mg total) by mouth 2 (two) times daily.  Marland Kitchen aspirin EC 81 MG tablet Take 1 tablet (81 mg total) by mouth daily.  Marland Kitchen atorvastatin (LIPITOR) 10 MG tablet Take 1 tablet (10 mg total) by mouth every morning.  . carvedilol (COREG) 6.25 MG tablet Take 1 tablet (6.25 mg total) by mouth 2 (two) times daily with a meal.  . DHA-EPA-Coenzyme Q10-Vitamin E (CO Q-10 VITAMIN E FISH OIL PO) Take 1,000 mg by mouth 2 (two) times daily.  . furosemide (LASIX) 20 MG tablet Take 1 tablet (20 mg total) by mouth daily.  Marland Kitchen glucose blood (ACCU-CHEK SMARTVIEW) test strip Use as instructed to check blood sugar 4 times per day dx code E11.9  . guaiFENesin-dextromethorphan (ROBITUSSIN DM) 100-10 MG/5ML syrup Take 5 mLs by mouth at bedtime as needed for cough.  . insulin glargine  (LANTUS) 100 UNIT/ML injection Inject 0.25 mLs (25 Units total) into the skin at bedtime.  . insulin regular (NOVOLIN R RELION) 100 units/mL injection Inject 6 units before breakfast and lunch and 8 units before supper  . Insulin Syringe-Needle U-100 31G X 5/16" 0.3 ML MISC Use 3 per day to inject insulin  . metFORMIN (GLUCOPHAGE) 1000 MG tablet Take 1,000 mg by mouth 2 (two) times daily with a meal.  . Multiple Vitamin (MULTIVITAMIN) tablet Take 1 tablet by mouth daily.  . sildenafil (VIAGRA) 100 MG tablet Take 1 tablet (100 mg total) by mouth daily as needed for erectile dysfunction (1 hr prior to sexual activity).   No facility-administered encounter medications on file as of 01/04/2016.    Functional Status:  In your present state of health, do you have any difficulty performing the following activities: 01/04/2016 04/19/2015  Hearing? N N  Vision? N N  Difficulty concentrating or making decisions? N N  Walking or climbing stairs? Y Y  Dressing or bathing? N N  Doing errands, shopping? N N  Preparing Food and eating ? N -  Using the Toilet? N -  In the past six months, have you accidently leaked urine? N -  Do you have problems with loss of bowel control? N -  Managing your Medications? N -  Managing your Finances? N -  Housekeeping or managing your Housekeeping? N -    Fall/Depression Screening: PHQ 2/9 Scores  01/04/2016 11/18/2015  PHQ - 2 Score 0 0    Assessment: Patient will benefit from health coach outreach for diabetes disease management.    Plan:  [facility] CM Care Plan Problem One        Most Recent Value   Care Plan Problem One  Diabetes Knowledge Deficit   Role Documenting the Problem One  Health Coach   Care Plan for Problem One  Active   THN Long Term Goal (31-90 days)  Patient will decrease A1c by 0.5 points within 90 days.   THN Long Term Goal Start Date  01/04/16   Interventions for Problem One [place] discussed with patient A1c and goals for  A1c.     THN CM Short Term Goal #1 (0-30 days)  Patient will be able to verbalize carbohydrate content of foods within 30 days.   THN CM Short Term Goal #1 Start Date  01/04/16   Interventions for Short Term Goal #1  RN Health Coach discussed with patient importance of reading labels on food to know carbohydrate content.     THN CM Short Term Goal #2 (0-30 days)  Patient will be able to report maintaining low carbohydrate diet within 30 days.    THN CM Short Term Goal #2 Start Date  01/04/16   Interventions for Short Term Goal #2  [place] discussed avoidance of high carbohydrate foods such as potatoes, rice, and pastas.    THN CM Short Term Goal #3 (0-30 days)  Patient right foot cut will be healed within 30 days   THN CM Short Term Goal #3 Start Date  01/04/16   Interventions for Short Tern Goal #3  [place] discussed with patient foot care and signs and symptoms of infection.        RN Health Coach will provide ongoing education for patient on diabetes through phone calls and sending printed information to patient for further discussion.  RN Health Coach will send welcome packet with consent to patient as well as printed information on diabetes. RN Health Coach will send initial barriers letter, assessment, and care plan to primary care physician.  RN Health Coach will contact patient within one month and patient agrees to next contact.    [name], RN, MSN [place] [phone]

## 2016-01-07 ENCOUNTER — Other Ambulatory Visit: Payer: Self-pay | Admitting: Endocrinology

## 2016-01-09 ENCOUNTER — Telehealth: Payer: Self-pay | Admitting: Endocrinology

## 2016-01-09 ENCOUNTER — Other Ambulatory Visit: Payer: Self-pay | Admitting: *Deleted

## 2016-01-09 ENCOUNTER — Encounter (HOSPITAL_COMMUNITY): Payer: Self-pay | Admitting: Emergency Medicine

## 2016-01-09 ENCOUNTER — Emergency Department (HOSPITAL_COMMUNITY): Payer: Commercial Managed Care - HMO

## 2016-01-09 ENCOUNTER — Inpatient Hospital Stay (HOSPITAL_COMMUNITY)
Admission: EM | Admit: 2016-01-09 | Discharge: 2016-01-17 | DRG: 853 | Disposition: A | Discharge status: Rehabilitation Facility | Payer: Commercial Managed Care - HMO | Attending: Internal Medicine | Admitting: Internal Medicine

## 2016-01-09 DIAGNOSIS — I5042 Chronic combined systolic (congestive) and diastolic (congestive) heart failure: Secondary | ICD-10-CM | POA: Diagnosis not present

## 2016-01-09 DIAGNOSIS — E1152 Type 2 diabetes mellitus with diabetic peripheral angiopathy with gangrene: Secondary | ICD-10-CM | POA: Diagnosis present

## 2016-01-09 DIAGNOSIS — L97519 Non-pressure chronic ulcer of other part of right foot with unspecified severity: Secondary | ICD-10-CM | POA: Diagnosis present

## 2016-01-09 DIAGNOSIS — Z953 Presence of xenogenic heart valve: Secondary | ICD-10-CM | POA: Diagnosis not present

## 2016-01-09 DIAGNOSIS — Z881 Allergy status to other antibiotic agents status: Secondary | ICD-10-CM

## 2016-01-09 DIAGNOSIS — Z7901 Long term (current) use of anticoagulants: Secondary | ICD-10-CM

## 2016-01-09 DIAGNOSIS — E11628 Type 2 diabetes mellitus with other skin complications: Secondary | ICD-10-CM | POA: Diagnosis not present

## 2016-01-09 DIAGNOSIS — Z833 Family history of diabetes mellitus: Secondary | ICD-10-CM

## 2016-01-09 DIAGNOSIS — K5900 Constipation, unspecified: Secondary | ICD-10-CM | POA: Diagnosis present

## 2016-01-09 DIAGNOSIS — M79671 Pain in right foot: Secondary | ICD-10-CM | POA: Diagnosis not present

## 2016-01-09 DIAGNOSIS — E1142 Type 2 diabetes mellitus with diabetic polyneuropathy: Secondary | ICD-10-CM | POA: Diagnosis present

## 2016-01-09 DIAGNOSIS — R739 Hyperglycemia, unspecified: Secondary | ICD-10-CM

## 2016-01-09 DIAGNOSIS — M7989 Other specified soft tissue disorders: Secondary | ICD-10-CM | POA: Diagnosis not present

## 2016-01-09 DIAGNOSIS — G8918 Other acute postprocedural pain: Secondary | ICD-10-CM | POA: Diagnosis present

## 2016-01-09 DIAGNOSIS — Z794 Long term (current) use of insulin: Secondary | ICD-10-CM

## 2016-01-09 DIAGNOSIS — L97919 Non-pressure chronic ulcer of unspecified part of right lower leg with unspecified severity: Secondary | ICD-10-CM | POA: Diagnosis not present

## 2016-01-09 DIAGNOSIS — A419 Sepsis, unspecified organism: Secondary | ICD-10-CM | POA: Diagnosis not present

## 2016-01-09 DIAGNOSIS — I10 Essential (primary) hypertension: Secondary | ICD-10-CM

## 2016-01-09 DIAGNOSIS — E1165 Type 2 diabetes mellitus with hyperglycemia: Secondary | ICD-10-CM | POA: Diagnosis present

## 2016-01-09 DIAGNOSIS — Z6833 Body mass index (BMI) 33.0-33.9, adult: Secondary | ICD-10-CM | POA: Diagnosis not present

## 2016-01-09 DIAGNOSIS — F329 Major depressive disorder, single episode, unspecified: Secondary | ICD-10-CM | POA: Diagnosis present

## 2016-01-09 DIAGNOSIS — I428 Other cardiomyopathies: Secondary | ICD-10-CM | POA: Diagnosis not present

## 2016-01-09 DIAGNOSIS — D62 Acute posthemorrhagic anemia: Secondary | ICD-10-CM | POA: Diagnosis not present

## 2016-01-09 DIAGNOSIS — Z7982 Long term (current) use of aspirin: Secondary | ICD-10-CM

## 2016-01-09 DIAGNOSIS — L089 Local infection of the skin and subcutaneous tissue, unspecified: Secondary | ICD-10-CM | POA: Diagnosis not present

## 2016-01-09 DIAGNOSIS — Z89021 Acquired absence of right finger(s): Secondary | ICD-10-CM

## 2016-01-09 DIAGNOSIS — Z8249 Family history of ischemic heart disease and other diseases of the circulatory system: Secondary | ICD-10-CM | POA: Diagnosis not present

## 2016-01-09 DIAGNOSIS — E118 Type 2 diabetes mellitus with unspecified complications: Secondary | ICD-10-CM

## 2016-01-09 DIAGNOSIS — E11621 Type 2 diabetes mellitus with foot ulcer: Secondary | ICD-10-CM | POA: Diagnosis present

## 2016-01-09 DIAGNOSIS — I351 Nonrheumatic aortic (valve) insufficiency: Secondary | ICD-10-CM | POA: Diagnosis present

## 2016-01-09 DIAGNOSIS — N179 Acute kidney failure, unspecified: Secondary | ICD-10-CM

## 2016-01-09 DIAGNOSIS — E119 Type 2 diabetes mellitus without complications: Secondary | ICD-10-CM

## 2016-01-09 DIAGNOSIS — I70261 Atherosclerosis of native arteries of extremities with gangrene, right leg: Secondary | ICD-10-CM | POA: Diagnosis not present

## 2016-01-09 DIAGNOSIS — E781 Pure hyperglyceridemia: Secondary | ICD-10-CM | POA: Diagnosis present

## 2016-01-09 DIAGNOSIS — D72829 Elevated white blood cell count, unspecified: Secondary | ICD-10-CM | POA: Diagnosis present

## 2016-01-09 DIAGNOSIS — M86271 Subacute osteomyelitis, right ankle and foot: Secondary | ICD-10-CM | POA: Diagnosis not present

## 2016-01-09 DIAGNOSIS — E1169 Type 2 diabetes mellitus with other specified complication: Secondary | ICD-10-CM | POA: Diagnosis not present

## 2016-01-09 DIAGNOSIS — IMO0001 Reserved for inherently not codable concepts without codable children: Secondary | ICD-10-CM

## 2016-01-09 DIAGNOSIS — Z952 Presence of prosthetic heart valve: Secondary | ICD-10-CM | POA: Diagnosis present

## 2016-01-09 DIAGNOSIS — Z4781 Encounter for orthopedic aftercare following surgical amputation: Secondary | ICD-10-CM | POA: Diagnosis not present

## 2016-01-09 DIAGNOSIS — I110 Hypertensive heart disease with heart failure: Secondary | ICD-10-CM | POA: Diagnosis present

## 2016-01-09 DIAGNOSIS — E871 Hypo-osmolality and hyponatremia: Secondary | ICD-10-CM | POA: Diagnosis not present

## 2016-01-09 DIAGNOSIS — I429 Cardiomyopathy, unspecified: Secondary | ICD-10-CM | POA: Diagnosis not present

## 2016-01-09 DIAGNOSIS — D729 Disorder of white blood cells, unspecified: Secondary | ICD-10-CM | POA: Diagnosis not present

## 2016-01-09 DIAGNOSIS — Z89511 Acquired absence of right leg below knee: Secondary | ICD-10-CM | POA: Diagnosis present

## 2016-01-09 DIAGNOSIS — I2510 Atherosclerotic heart disease of native coronary artery without angina pectoris: Secondary | ICD-10-CM | POA: Diagnosis present

## 2016-01-09 DIAGNOSIS — I480 Paroxysmal atrial fibrillation: Secondary | ICD-10-CM | POA: Diagnosis not present

## 2016-01-09 DIAGNOSIS — S88111S Complete traumatic amputation at level between knee and ankle, right lower leg, sequela: Secondary | ICD-10-CM | POA: Diagnosis not present

## 2016-01-09 DIAGNOSIS — N281 Cyst of kidney, acquired: Secondary | ICD-10-CM | POA: Diagnosis not present

## 2016-01-09 DIAGNOSIS — I96 Gangrene, not elsewhere classified: Secondary | ICD-10-CM | POA: Diagnosis not present

## 2016-01-09 DIAGNOSIS — N170 Acute kidney failure with tubular necrosis: Secondary | ICD-10-CM | POA: Diagnosis not present

## 2016-01-09 DIAGNOSIS — R197 Diarrhea, unspecified: Secondary | ICD-10-CM | POA: Diagnosis present

## 2016-01-09 LAB — CBC WITH DIFFERENTIAL/PLATELET
BASOS ABS: 0 10*3/uL (ref 0.0–0.1)
BASOS PCT: 0 %
EOS ABS: 0 10*3/uL (ref 0.0–0.7)
Eosinophils Relative: 0 %
HCT: 43.9 % (ref 39.0–52.0)
HEMOGLOBIN: 15 g/dL (ref 13.0–17.0)
LYMPHS PCT: 5 %
Lymphs Abs: 1.4 10*3/uL (ref 0.7–4.0)
MCH: 28.5 pg (ref 26.0–34.0)
MCHC: 34.2 g/dL (ref 30.0–36.0)
MCV: 83.5 fL (ref 78.0–100.0)
MONO ABS: 1.7 10*3/uL — AB (ref 0.1–1.0)
Monocytes Relative: 6 %
NEUTROS ABS: 25.2 10*3/uL — AB (ref 1.7–7.7)
NEUTROS PCT: 89 %
PLATELETS: 396 10*3/uL (ref 150–400)
RBC: 5.26 MIL/uL (ref 4.22–5.81)
RDW: 13.8 % (ref 11.5–15.5)
WBC: 28.3 10*3/uL — ABNORMAL HIGH (ref 4.0–10.5)

## 2016-01-09 LAB — CBG MONITORING, ED: GLUCOSE-CAPILLARY: 374 mg/dL — AB (ref 65–99)

## 2016-01-09 LAB — COMPREHENSIVE METABOLIC PANEL
ALT: 11 U/L — ABNORMAL LOW (ref 17–63)
AST: 16 U/L (ref 15–41)
Albumin: 2.6 g/dL — ABNORMAL LOW (ref 3.5–5.0)
Alkaline Phosphatase: 142 U/L — ABNORMAL HIGH (ref 38–126)
Anion gap: 20 — ABNORMAL HIGH (ref 5–15)
BILIRUBIN TOTAL: 1.4 mg/dL — AB (ref 0.3–1.2)
BUN: 17 mg/dL (ref 6–20)
CHLORIDE: 92 mmol/L — AB (ref 101–111)
CO2: 23 mmol/L (ref 22–32)
CREATININE: 1.14 mg/dL (ref 0.61–1.24)
Calcium: 9.4 mg/dL (ref 8.9–10.3)
Glucose, Bld: 328 mg/dL — ABNORMAL HIGH (ref 65–99)
POTASSIUM: 4.2 mmol/L (ref 3.5–5.1)
Sodium: 135 mmol/L (ref 135–145)
TOTAL PROTEIN: 8.6 g/dL — AB (ref 6.5–8.1)

## 2016-01-09 LAB — GLUCOSE, CAPILLARY: Glucose-Capillary: 290 mg/dL — ABNORMAL HIGH (ref 65–99)

## 2016-01-09 LAB — I-STAT CG4 LACTIC ACID, ED: LACTIC ACID, VENOUS: 3.58 mmol/L — AB (ref 0.5–2.0)

## 2016-01-09 LAB — C-REACTIVE PROTEIN: CRP: 24.8 mg/dL — ABNORMAL HIGH (ref <1.0)

## 2016-01-09 LAB — LACTIC ACID, PLASMA
Lactic Acid, Venous: 1.9 mmol/L (ref 0.5–2.0)
Lactic Acid, Venous: 1.2 mmol/L (ref 0.5–2.0)

## 2016-01-09 LAB — PROCALCITONIN: Procalcitonin: 0.3 ng/mL

## 2016-01-09 LAB — PREALBUMIN: Prealbumin: 3.3 mg/dL — ABNORMAL LOW (ref 18–38)

## 2016-01-09 LAB — PROTIME-INR
INR: 1.36 (ref 0.00–1.49)
PROTHROMBIN TIME: 16.9 s — AB (ref 11.6–15.2)

## 2016-01-09 LAB — APTT: APTT: 29 s (ref 24–37)

## 2016-01-09 LAB — SEDIMENTATION RATE: SED RATE: 62 mm/h — AB (ref 0–16)

## 2016-01-09 MED ORDER — ADULT MULTIVITAMIN W/MINERALS CH
1.0000 | ORAL_TABLET | Freq: Every day | ORAL | Status: DC
  Administered 2016-01-10 – 2016-01-17 (×7): 1 via ORAL
  Filled 2016-01-09 (×7): qty 1

## 2016-01-09 MED ORDER — ACCU-CHEK NANO SMARTVIEW W/DEVICE KIT: PACK | Status: DC

## 2016-01-09 MED ORDER — ATORVASTATIN CALCIUM 10 MG PO TABS
10.0000 mg | ORAL_TABLET | Freq: Every morning | ORAL | Status: DC
  Administered 2016-01-10 – 2016-01-17 (×8): 10 mg via ORAL
  Filled 2016-01-09 (×8): qty 1

## 2016-01-09 MED ORDER — PIPERACILLIN-TAZOBACTAM 3.375 G IVPB 30 MIN
3.3750 g | Freq: Once | INTRAVENOUS | Status: AC
  Administered 2016-01-09: 3.375 g via INTRAVENOUS
  Filled 2016-01-09: qty 50

## 2016-01-09 MED ORDER — POLYETHYLENE GLYCOL 3350 17 G PO PACK: 17.0000 g | PACK | Freq: Every day | ORAL | Status: DC | PRN

## 2016-01-09 MED ORDER — BISACODYL 5 MG PO TBEC: 5.0000 mg | DELAYED_RELEASE_TABLET | Freq: Every day | ORAL | Status: DC | PRN

## 2016-01-09 MED ORDER — MORPHINE SULFATE (PF) 4 MG/ML IV SOLN
4.0000 mg | INTRAVENOUS | Status: DC | PRN
  Administered 2016-01-09: 4 mg via INTRAVENOUS
  Filled 2016-01-09: qty 1

## 2016-01-09 MED ORDER — HEPARIN SODIUM (PORCINE) 5000 UNIT/ML IJ SOLN
5000.0000 [IU] | Freq: Three times a day (TID) | INTRAMUSCULAR | Status: DC
  Administered 2016-01-09: 5000 [IU] via SUBCUTANEOUS
  Filled 2016-01-09: qty 1

## 2016-01-09 MED ORDER — ACETAMINOPHEN 650 MG RE SUPP: 650.0000 mg | Freq: Four times a day (QID) | RECTAL | Status: DC | PRN

## 2016-01-09 MED ORDER — ONDANSETRON HCL 4 MG/2ML IJ SOLN
4.0000 mg | Freq: Four times a day (QID) | INTRAMUSCULAR | Status: DC | PRN
  Administered 2016-01-11 – 2016-01-12 (×3): 4 mg via INTRAVENOUS
  Filled 2016-01-09 (×3): qty 2

## 2016-01-09 MED ORDER — INSULIN ASPART 100 UNIT/ML ~~LOC~~ SOLN
0.0000 [IU] | Freq: Three times a day (TID) | SUBCUTANEOUS | Status: DC
Start: 2016-01-10 — End: 2016-01-13
  Administered 2016-01-10: 5 [IU] via SUBCUTANEOUS
  Administered 2016-01-10: 3 [IU] via SUBCUTANEOUS
  Administered 2016-01-10: 11 [IU] via SUBCUTANEOUS
  Administered 2016-01-11 (×2): 3 [IU] via SUBCUTANEOUS
  Administered 2016-01-11: 5 [IU] via SUBCUTANEOUS
  Administered 2016-01-12: 8 [IU] via SUBCUTANEOUS
  Administered 2016-01-12: 3 [IU] via SUBCUTANEOUS

## 2016-01-09 MED ORDER — VANCOMYCIN HCL IN DEXTROSE 1-5 GM/200ML-% IV SOLN
1000.0000 mg | Freq: Once | INTRAVENOUS | Status: AC
  Administered 2016-01-09: 1000 mg via INTRAVENOUS
  Filled 2016-01-09: qty 200

## 2016-01-09 MED ORDER — SODIUM CHLORIDE 0.9 % IV BOLUS (SEPSIS)
1000.0000 mL | INTRAVENOUS | Status: AC
  Administered 2016-01-09 (×3): 1000 mL via INTRAVENOUS

## 2016-01-09 MED ORDER — HYDROCODONE-ACETAMINOPHEN 5-325 MG PO TABS
1.0000 | ORAL_TABLET | ORAL | Status: DC | PRN
Start: 2016-01-09 — End: 2016-01-17
  Administered 2016-01-11: 2 via ORAL
  Administered 2016-01-11 – 2016-01-12 (×2): 1 via ORAL
  Administered 2016-01-12 – 2016-01-17 (×12): 2 via ORAL
  Filled 2016-01-09: qty 1
  Filled 2016-01-09 (×3): qty 2
  Filled 2016-01-09: qty 1
  Filled 2016-01-09 (×8): qty 2
  Filled 2016-01-09: qty 1
  Filled 2016-01-09: qty 2
  Filled 2016-01-09: qty 1
  Filled 2016-01-09: qty 2

## 2016-01-09 MED ORDER — DEXTROSE 5 % IV SOLN
2.0000 g | INTRAVENOUS | Status: DC
  Administered 2016-01-09: 2 g via INTRAVENOUS
  Filled 2016-01-09: qty 2

## 2016-01-09 MED ORDER — METRONIDAZOLE IN NACL 5-0.79 MG/ML-% IV SOLN
500.0000 mg | Freq: Three times a day (TID) | INTRAVENOUS | Status: DC
  Administered 2016-01-10: 500 mg via INTRAVENOUS
  Filled 2016-01-09 (×3): qty 100

## 2016-01-09 MED ORDER — ONDANSETRON HCL 4 MG PO TABS: 4.0000 mg | ORAL_TABLET | Freq: Four times a day (QID) | ORAL | Status: DC | PRN

## 2016-01-09 MED ORDER — ONDANSETRON HCL 4 MG/2ML IJ SOLN
4.0000 mg | Freq: Once | INTRAMUSCULAR | Status: AC
  Administered 2016-01-09: 4 mg via INTRAVENOUS
  Filled 2016-01-09: qty 2

## 2016-01-09 MED ORDER — INSULIN GLARGINE 100 UNIT/ML ~~LOC~~ SOLN
20.0000 [IU] | Freq: Every day | SUBCUTANEOUS | Status: DC
  Administered 2016-01-09: 20 [IU] via SUBCUTANEOUS
  Filled 2016-01-09 (×2): qty 0.2

## 2016-01-09 MED ORDER — AMIODARONE HCL 100 MG PO TABS
200.0000 mg | ORAL_TABLET | Freq: Every day | ORAL | Status: DC
  Administered 2016-01-10 – 2016-01-17 (×8): 200 mg via ORAL
  Filled 2016-01-09 (×8): qty 2

## 2016-01-09 MED ORDER — ACETAMINOPHEN 325 MG PO TABS
650.0000 mg | ORAL_TABLET | Freq: Once | ORAL | Status: AC
  Administered 2016-01-09: 650 mg via ORAL
  Filled 2016-01-09: qty 2

## 2016-01-09 MED ORDER — ACETAMINOPHEN 325 MG PO TABS
650.0000 mg | ORAL_TABLET | Freq: Four times a day (QID) | ORAL | Status: DC | PRN
  Administered 2016-01-10 – 2016-01-11 (×2): 650 mg via ORAL
  Filled 2016-01-09 (×2): qty 2

## 2016-01-09 MED ORDER — MORPHINE SULFATE (PF) 2 MG/ML IV SOLN
2.0000 mg | INTRAVENOUS | Status: DC | PRN
  Administered 2016-01-10 – 2016-01-16 (×3): 2 mg via INTRAVENOUS
  Filled 2016-01-09 (×3): qty 1

## 2016-01-09 MED ORDER — SODIUM CHLORIDE 0.9 % IV BOLUS (SEPSIS): 500.0000 mL | INTRAVENOUS | Status: AC

## 2016-01-09 MED ORDER — CARVEDILOL 6.25 MG PO TABS
6.2500 mg | ORAL_TABLET | Freq: Two times a day (BID) | ORAL | Status: DC
  Administered 2016-01-10 – 2016-01-17 (×15): 6.25 mg via ORAL
  Filled 2016-01-09 (×15): qty 1

## 2016-01-09 MED ORDER — INSULIN ASPART 100 UNIT/ML ~~LOC~~ SOLN
4.0000 [IU] | Freq: Three times a day (TID) | SUBCUTANEOUS | Status: DC
  Administered 2016-01-10 – 2016-01-12 (×7): 4 [IU] via SUBCUTANEOUS

## 2016-01-09 NOTE — Telephone Encounter (Signed)
rx faxed, he does need an appointment.

## 2016-01-09 NOTE — Telephone Encounter (Signed)
rx for accuchek meter please call into [employer]

## 2016-01-09 NOTE — ED Provider Notes (Signed)
CSN: [id number]     Arrival date & time 01/09/16  1614 History   First MD Initiated Contact with Patient 01/09/16 1641     Chief Complaint  Patient presents with  . Hyperglycemia  . Wound Infection     (Consider location/radiation/quality/duration/timing/severity/associated sxs/prior Treatment) HPI 70 year old male who presents with hyperglycemia. He has a history of paroxysmal atrial fibrillation on Eliquis, hypertension, nonischemic cardiomyopathy with EF of 50-55%, type 2 diabetes, and aortic valve replacement with bioprosthetic valve. States that 2 weeks ago he had a cut that was on his right foot which she initially thought he was taking care of without difficulty with over-the-counter antibiotic cream. States that over the course of the past week he has had increased pain, redness, and swelling that has extended from his cut. Has had subjective fevers and chills, with nausea and vomiting. During this period of time also having hyperglycemia with point-of-care glucose in the 500s. States that he hasn't eaten much during this period of time and has not been giving himself insulin. Denies chest pain, difficulty breathing, diarrhea, urinary complaints, abdominal pain, or difficulty breathing. Does notice mild cough. Past Medical History  Diagnosis Date  . Hypertension   . PAF (paroxysmal atrial fibrillation) ([place])     a. Postop 2008. b. recurrent afib 08/2014 with LAA clot noted on TEE. EF was down again at 25%. He was anticoagulated with Eliquis and placed on Amiodarone.He ultimately underwent TEE-DCCV 08/2014.  Marland Kitchen Hypertriglyceridemia   . Cardiomyopathy ([place])     EF originally 20%-now 45%, no CAD on cath  . Chronic combined systolic and diastolic CHF (congestive heart failure) ([place])     a. 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 Reported h/o cath in 2008 without significant CAD.  Marland Kitchen Type II diabetes mellitus ([place])    . Arthritis     .  Marland Kitchen Depression     "a little right now" (04/19/2015)  . S/P aortic valve replacement with bioprosthetic valve     a. bioprosthetic AVR in 05/2007 in [place].  . Hyperlipidemia    Past Surgical History  Procedure Laterality Date  . Aortic valve replacement  05/2007    with tissue graft  . I&d extremity Right 05/05/2014    Procedure: IRRIGATION AND DEBRIDEMENT EXTREMITY;  Surgeon: [name], MD;  Location: [place];  Service: Orthopedics;  Laterality: Right;  . Tee without cardioversion N/A 07/23/2014    Procedure: TRANSESOPHAGEAL ECHOCARDIOGRAM (TEE);  Surgeon: Josue Hector, MD;  Location: [facility] ENDOSCOPY;  Service: Cardiovascular;  Laterality: N/A;  . Cardioversion N/A 07/23/2014    Procedure: CARDIOVERSION;  Surgeon: Josue Hector, MD;  Location[facility] ENDOSCOPY;  Service: Cardiovascular;  Laterality: N/A;  . Tee without cardioversion N/A 09/01/2014    Procedure: TRANSESOPHAGEAL ECHOCARDIOGRAM (TEE);  Surgeon: [name], MD;  Location: [facility] ENDOSCOPY;  Service: Cardiovascular;  Laterality: N/A;  . Cardioversion N/A 09/01/2014    Procedure: CARDIOVERSION;  Surgeon: [name], MD;  Location: [facility] ENDOSCOPY;  Service: Cardiovascular;  Laterality: N/A;  . Amputation Right 07/29/2014    Procedure: AMPUTATION RIGHT LONG FINGER;  Surgeon: [name], MD;  Location: [place];  Service: Orthopedics;  Laterality: Right;  . Cardiac valve replacement    . Tonsillectomy  1954  . Cardiac catheterization  "several"   Family History  Problem Relation Age of Onset  . Colon cancer Father   . Diabetes Father   . Liver disease  Brother   . Heart murmur Child   . Congenital heart disease Other   . Diabetes Maternal Grandmother   . Heart disease Maternal Grandfather     A Fib   Social History  Substance Use Topics  . Smoking status: Never Smoker   . Smokeless tobacco: Never Used  . Alcohol Use: 0.0 oz/week    0 Standard drinks or equivalent per week     Comment: 04/19/2015  "might have a beer or 2, 2-3 times/yr"    Review of Systems 10/14 systems reviewed and are negative other than those stated in the HPI    Allergies  Amoxicill-clarithro-omeprazole  Home Medications   Prior to Admission medications   Medication Sig Start Date End Date Taking? Authorizing Provider  amiodarone (PACERONE) 200 MG tablet Take 200 mg by mouth daily. One daily    Historical Provider, MD  apixaban (ELIQUIS) 5 MG TABS tablet Take 1 tablet (5 mg total) by mouth 2 (two) times daily. 04/22/15   Dayna N Dunn, PA-C  aspirin EC 81 MG tablet Take 1 tablet (81 mg total) by mouth daily. 04/22/15   Dayna N Dunn, PA-C  atorvastatin (LIPITOR) 10 MG tablet Take 1 tablet (10 mg total) by mouth every morning. 06/01/15   Jerline Pain, MD  Blood Glucose Monitoring Suppl (ACCU-CHEK NANO SMARTVIEW) w/Device KIT Use to check blood sugar 4 times per day dx code E11.9 01/09/16   Elayne Snare, MD  carvedilol (COREG) 6.25 MG tablet Take 1 tablet (6.25 mg total) by mouth 2 (two) times daily with a meal. 08/17/15   Jerline Pain, MD  DHA-EPA-Coenzyme Q10-Vitamin E (CO Q-10 VITAMIN E FISH OIL PO) Take 1,000 mg by mouth 2 (two) times daily.    Historical Provider, MD  furosemide (LASIX) 20 MG tablet Take 1 tablet (20 mg total) by mouth daily. 08/23/15   Jerline Pain, MD  glucose blood (ACCU-CHEK SMARTVIEW) test strip Use as instructed to check blood sugar 4 times per day dx code E11.9 07/21/15   Elayne Snare, MD  guaiFENesin-dextromethorphan (ROBITUSSIN DM) 100-10 MG/5ML syrup Take 5 mLs by mouth at bedtime as needed for cough.    Historical Provider, MD  insulin glargine (LANTUS) 100 UNIT/ML injection Inject 0.25 mLs (25 Units total) into the skin at bedtime. 09/23/15   Elayne Snare, MD  insulin regular (NOVOLIN R RELION) 100 units/mL injection Inject 6 units before breakfast and lunch and 8 units before supper 06/20/15   Elayne Snare, MD  Insulin Syringe-Needle U-100 31G X 5/16" 0.3 ML MISC Use 3 per day to inject insulin  06/20/15   Elayne Snare, MD  metFORMIN (GLUCOPHAGE) 1000 MG tablet Take 1,000 mg by mouth 2 (two) times daily with a meal.    Historical Provider, MD  Multiple Vitamin (MULTIVITAMIN) tablet Take 1 tablet by mouth daily.    Historical Provider, MD  sildenafil (VIAGRA) 100 MG tablet Take 1 tablet (100 mg total) by mouth daily as needed for erectile dysfunction (1 hr prior to sexual activity). 10/27/15   Jerline Pain, MD   BP 125/57 mmHg  Pulse 78  Resp 12  Ht 6' (1.829 m)  Wt 235 lb (106.595 kg)  BMI 31.86 kg/m2  SpO2 94% Physical Exam Physical Exam  Nursing note and vitals reviewed. Constitutional:  non-toxic, and in no acute distress Head: Normocephalic and atraumatic.  Mouth/Throat: Oropharynx is clear and dry.  Neck: Normal range of motion. Neck supple.  Cardiovascular: Tachycardic rate and regular rhythm.  No  edema. Pulmonary/Chest: Effort normal and breath sounds normal.  Abdominal: Soft. There is no tenderness. There is no rebound and no guarding.  Musculoskeletal: Normal range of motion.  Neurological: Alert, no facial droop, fluent speech, moves all extremities symmetrically Skin: Skin is warm and dry. Induration, erythema, swelling of dorsum of the right foot with necrotic areas involving the large toe Psychiatric: Cooperative  ED Course  Procedures (including critical care time) Labs Review Labs Reviewed  COMPREHENSIVE METABOLIC PANEL - Abnormal; Notable for the following:    Chloride 92 (*)    Glucose, Bld 328 (*)    Total Protein 8.6 (*)    Albumin 2.6 (*)    ALT 11 (*)    Alkaline Phosphatase 142 (*)    Total Bilirubin 1.4 (*)    Anion gap 20 (*)    All other components within normal limits  CBC WITH DIFFERENTIAL/PLATELET - Abnormal; Notable for the following:    WBC 28.3 (*)    Neutro Abs 25.2 (*)    Monocytes Absolute 1.7 (*)    All other components within normal limits  CBG MONITORING, ED - Abnormal; Notable for the following:    Glucose-Capillary 374 (*)     All other components within normal limits  I-STAT CG4 LACTIC ACID, ED - Abnormal; Notable for the following:    Lactic Acid, Venous 3.58 (*)    All other components within normal limits  CULTURE, BLOOD (ROUTINE X 2)  CULTURE, BLOOD (ROUTINE X 2)  URINE CULTURE  URINALYSIS, ROUTINE W REFLEX MICROSCOPIC (NOT AT [facility])    Imaging Review Dg Chest 2 View  01/09/2016  CLINICAL DATA:  Possible sepsis.  Large foot infection. EXAM: CHEST  2 VIEW COMPARISON:  Chest radiograph 04/20/2015 FINDINGS: Low lung volumes. Status post median sternotomy. Stable cardiac and mediastinal contours. No consolidative pulmonary opacities. No pleural effusion or pneumothorax. Regional skeleton is unremarkable. IMPRESSION: No active cardiopulmonary disease. Electronically Signed   By: Lovey Newcomer M.D.   On: 01/09/2016 17:44   Dg Foot 2 Views Right  01/09/2016  CLINICAL DATA:  Possible sepsis.  Necrotic tissue and great toe. EXAM: RIGHT FOOT - 2 VIEW COMPARISON:  None. FINDINGS: No acute fracture. No dislocation. Mild vascular calcifications. There is mild soft tissue swelling about the forefoot but no emphysema. Minimal spurring at the inferior calcaneus. IMPRESSION: No acute bony pathology. Soft tissue swelling over the forefoot is noted. Electronically Signed   By: Marybelle Killings M.D.   On: 01/09/2016 17:46   I have personally reviewed and evaluated these images and lab results as part of my medical decision-making.   EKG Interpretation   Date/Time:  Monday January 09 2016 18:19:46 EDT Ventricular Rate:  84 PR Interval:  191 QRS Duration: 129 QT Interval:  428 QTC Calculation: 506 R Axis:   -42 Text Interpretation:  Sinus rhythm Supraventricular bigeminy Left bundle  branch block No bigeminy. Occasionl PVC. No significantly changed since  last EKG  Confirmed by Jonika Critz MD, Elward Nocera [phone]) on 01/09/2016 6:36:51 PM     CRITICAL CARE Performed by: Forde Dandy   Total critical care time: 35 minutes  Critical care time  was exclusive of separately billable procedures and treating other patients.  Critical care was necessary to treat or prevent imminent or life-threatening deterioration.  Critical care was time spent personally by me on the following activities: development of treatment plan with patient and/or surrogate as well as nursing, discussions with consultants, evaluation of patient's response to treatment, examination of  patient, obtaining history from patient or surrogate, ordering and performing treatments and interventions, ordering and review of laboratory studies, ordering and review of radiographic studies, pulse oximetry and re-evaluation of patient's condition.  MDM   Final diagnoses:  Sepsis, due to unspecified organism ([place])  Hyperglycemia  Diabetic foot infection ([place])    Presenting with sepsis secondary to diabetic foot infection. Is febrile to 100.4 Fahrenheit and tachycardic on arrival. Normotensive and in no respiratory distress. Right foot with evidence of cellulitis and necrosis also involving the large toe onto the dorsum of the foot. X-ray with only soft tissue swelling and no bone involvement. Code sepsis called upon first evaluating this patient. He is written for 3.5 L of IV fluids for resuscitation per protocol. I started on vancomycin and Zosyn, and blood cultures obtained prior to abx. Initial blood work concerning for leukocytosis of 28 and a lactic acid of 3.58. He has normal renal function, is hyperglycemic but without evidence of DKA given normal bicarbonate. CXR visualized and clear. Pending UA and UCx. Discussed with Dr. out bed from hospitalist service to admit for ongoing management.   Forde Dandy, MD 01/09/16 [phone]

## 2016-01-09 NOTE — H&P (Signed)
[facility] History and Physical  Thomas Wright IYM:[id number] DOB: 1946/02/23 DOA: 01/09/2016  Referring physician: ED physician PCP: [name], MD  Specialists: Dr. Marlou Porch (cardiology), Dr. Rolla Etienne (pulmonology)   Chief Complaint:  Foot wound, fevers, hyperglycemia  HPI: Thomas Wright is a 70 y.o. male with PMH of paroxysmal atrial fibrillation on Eliquis, insulin-dependent diabetes mellitus, hypertension, aortic regurgitation status post valve replacement, and recurrent soft tissue infections who presents to the ED with fevers, chills, right foot wound, and uncontrolled blood sugars. Patient was previously followed in the wound clinic for an ulcer on the plantar aspect of the right foot but stopped following up last summer. He reports pain in his usual state of health until approximately 2-3 days ago when he noted swelling, color change, and occasional shooting pains that the right foot. The foot is largely insensate, but the patient reports an occasional sharp pain shooting up his right leg. Pain is worse with ambulation and better with rest. Patient believes that he developed a small cut at the plantar aspect of the foot approximately 3 days ago and believes that this is the source of the current wound. Of note, he had been receiving treatment for a wound at the site going back more than a year. Today, with fevers, chills, worsening malaise, and inability to control his blood sugars at home, he came into the ED for evaluation.  In ED, patient was found to be febrile to 100.4, tachycardic in the 110s, and with vital signs otherwise stable. Labs are notable for serum glucose of 328, leukocytosis of 28,300, and lactic acid of 3.58. Chest x-ray was negative for acute cardiopulmonary disease and radiographs of the right foot demonstrates soft tissue swelling only with no apparent underlying bony abnormality. Blood and urine cultures were obtained, patient was bolused with 30 cc/k of  normal saline, and empiric vancomycin and Zosyn were given. Tachycardia resolved with fluid boluses and patient remained hemodynamically stable the emergency department. He'll be admitted for ongoing evaluation and management of sepsis secondary to diabetic foot wound.  Where does patient live?   At home     Can patient participate in ADLs?  Yes        Review of Systems:   General: no sweats, weight change, poor appetite, or fatigue. Fever, chills.  HEENT: no blurry vision, hearing changes or sore throat Pulm: no dyspnea, cough, or wheeze CV: no chest pain or palpitations Abd: no nausea, vomiting, abdominal pain, diarrhea, or constipation GU: no dysuria, hematuria, increased urinary frequency, or urgency  Ext: no leg edema Neuro: no focal weakness, numbness, or tingling, no vision change or hearing loss Skin: no rash, no wounds aside from right foot  MSK: No muscle spasm, no deformity, no red, hot, or swollen joint aside from right foot Heme: No easy bruising or bleeding Travel history: No recent long distant travel    Allergy:  Allergies  Allergen Reactions  . Amoxicill-Clarithro-Omeprazole Diarrhea and Nausea And Vomiting    Past Medical History  Diagnosis Date  . Hypertension   . PAF (paroxysmal atrial fibrillation) ([place])     a. Postop 2008. b. recurrent afib 08/2014 with LAA clot noted on TEE. EF was down again at 25%. He was anticoagulated with Eliquis and placed on Amiodarone.He ultimately underwent TEE-DCCV 08/2014.  Marland Kitchen Hypertriglyceridemia   . Cardiomyopathy ([place])     EF originally 20%-now 45%, no CAD on cath  . Chronic combined systolic and diastolic CHF (congestive heart failure) ([place])  a. 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 Reported h/o cath in 2008 without significant CAD.  Marland Kitchen Type II diabetes mellitus ([place])   . Arthritis     .  Marland Kitchen Depression     "a little right now" (04/19/2015)  . S/P  aortic valve replacement with bioprosthetic valve     a. bioprosthetic AVR in 05/2007 in [place].  . Hyperlipidemia     Past Surgical History  Procedure Laterality Date  . Aortic valve replacement  05/2007    with tissue graft  . I&d extremity Right 05/05/2014    Procedure: IRRIGATION AND DEBRIDEMENT EXTREMITY;  Surgeon: [name], MD;  Location: [place];  Service: Orthopedics;  Laterality: Right;  . Tee without cardioversion N/A 07/23/2014    Procedure: TRANSESOPHAGEAL ECHOCARDIOGRAM (TEE);  Surgeon: Josue Hector, MD;  Location: [facility] ENDOSCOPY;  Service: Cardiovascular;  Laterality: N/A;  . Cardioversion N/A 07/23/2014    Procedure: CARDIOVERSION;  Surgeon: Josue Hector, MD;  Location: [facility] ENDOSCOPY;  Service: Cardiovascular;  Laterality: N/A;  . Tee without cardioversion N/A 09/01/2014    Procedure: TRANSESOPHAGEAL ECHOCARDIOGRAM (TEE);  Surgeon: [name], MD;  Location: [facility] ENDOSCOPY;  Service: Cardiovascular;  Laterality: N/A;  . Cardioversion N/A 09/01/2014    Procedure: CARDIOVERSION;  Surgeon: [name], MD;  Location: [facility] ENDOSCOPY;  Service: Cardiovascular;  Laterality: N/A;  . Amputation Right 07/29/2014    Procedure: AMPUTATION RIGHT LONG FINGER;  Surgeon: [name], MD;  Location: [facility];  Service: Orthopedics;  Laterality: Right;  . Cardiac valve replacement    . Tonsillectomy  1954  . Cardiac catheterization  "several"    Social History:  reports that he has never smoked. He has never used smokeless tobacco. He reports that he drinks alcohol. He reports that he does not use illicit drugs.  Family History:  Family History  Problem Relation Age of Onset  . Colon cancer Father   . Diabetes Father   . Liver disease Brother   . Heart murmur Child   . Congenital heart disease Other   . Diabetes Maternal Grandmother   . Heart disease Maternal Grandfather     A Fib     Prior to Admission medications   Medication Sig Start Date End Date Taking? Authorizing  Provider  amiodarone (PACERONE) 200 MG tablet Take 200 mg by mouth daily. One daily    Historical Provider, MD  apixaban (ELIQUIS) 5 MG TABS tablet Take 1 tablet (5 mg total) by mouth 2 (two) times daily. 04/22/15   Dayna N Dunn, PA-C  aspirin EC 81 MG tablet Take 1 tablet (81 mg total) by mouth daily. 04/22/15   Dayna N Dunn, PA-C  atorvastatin (LIPITOR) 10 MG tablet Take 1 tablet (10 mg total) by mouth every morning. 06/01/15   Jerline Pain, MD  Blood Glucose Monitoring Suppl (ACCU-CHEK NANO SMARTVIEW) w/Device KIT Use to check blood sugar 4 times per day dx code E11.9 01/09/16   Elayne Snare, MD  carvedilol (COREG) 6.25 MG tablet Take 1 tablet (6.25 mg total) by mouth 2 (two) times daily with a meal. 08/17/15   Jerline Pain, MD  DHA-EPA-Coenzyme Q10-Vitamin E (CO Q-10 VITAMIN E FISH OIL PO) Take 1,000 mg by mouth 2 (two) times daily.    Historical Provider, MD  furosemide (LASIX) 20 MG tablet Take 1 tablet (20 mg total) by mouth daily. 08/23/15   Jerline Pain, MD  glucose blood (ACCU-CHEK SMARTVIEW) test strip Use as instructed to check blood sugar 4 times per day dx code E11.9 07/21/15   Elayne Snare, MD  guaiFENesin-dextromethorphan (ROBITUSSIN DM) 100-10 MG/5ML syrup Take 5 mLs by mouth at bedtime as needed for cough.    Historical Provider, MD  insulin glargine (LANTUS) 100 UNIT/ML injection Inject 0.25 mLs (25 Units total) into the skin at bedtime. 09/23/15   Elayne Snare, MD  insulin regular (NOVOLIN R RELION) 100 units/mL injection Inject 6 units before breakfast and lunch and 8 units before supper 06/20/15   Elayne Snare, MD  Insulin Syringe-Needle U-100 31G X 5/16" 0.3 ML MISC Use 3 per day to inject insulin 06/20/15   Elayne Snare, MD  metFORMIN (GLUCOPHAGE) 1000 MG tablet Take 1,000 mg by mouth 2 (two) times daily with a meal.    Historical Provider, MD  Multiple Vitamin (MULTIVITAMIN) tablet Take 1 tablet by mouth daily.    Historical Provider, MD  sildenafil (VIAGRA) 100 MG tablet Take 1 tablet (100 mg  total) by mouth daily as needed for erectile dysfunction (1 hr prior to sexual activity). 10/27/15   Jerline Pain, MD    Physical Exam: Filed Vitals:   01/09/16 1655 01/09/16 1657 01/09/16 1800 01/09/16 1830  BP:  152/93 145/75 125/57  Pulse: 94 97 96 78  Resp:  12  12  Height:      Weight:      SpO2: 98% 98% 98% 94%   General: Not in acute distress HEENT:       Eyes: PERRL, EOMI, no scleral icterus or conjunctival pallor.       ENT: No discharge from the ears or nose, no pharyngeal ulcers, petechiae or exudate.        Neck: No JVD, no bruit, no appreciable mass Heme: No cervical adenopathy, no pallor Cardiac: Rate ~100 and irregular, No murmurs, No gallops or rubs. Pulm: Good air movement bilaterally. No rales, wheezing, rhonchi or rubs. Abd: Soft, nondistended, nontender, no rebound pain or gaurding, BS present. Ext:  Right foot with black great toe and 1st MTP; purulence, edema, and erythema extend towards ankle. 1+ PT pulses b/l.  Musculoskeletal: Aside from right foot findings, no gross deformity, no red, hot, swollen joints  Skin: No rashes or wounds on exposed surfaces aside from right foot  Neuro: Alert, oriented X3, cranial nerves II-XII grossly intact. No focal findings Psych: Patient is not overtly psychotic, appropriate mood and affect.  Labs on Admission:  Basic Metabolic Panel:  Recent Labs Lab 01/09/16 1642  NA 135  K 4.2  CL 92*  CO2 23  GLUCOSE 328*  BUN 17  CREATININE 1.14  CALCIUM 9.4   Liver Function Tests:  Recent Labs Lab 01/09/16 1642  AST 16  ALT 11*  ALKPHOS 142*  BILITOT 1.4*  PROT 8.6*  ALBUMIN 2.6*   No results for input(s): LIPASE, AMYLASE in the last 168 hours. No results for input(s): AMMONIA in the last 168 hours. CBC:  Recent Labs Lab 01/09/16 1642  WBC 28.3*  NEUTROABS 25.2*  HGB 15.0  HCT 43.9  MCV 83.5  PLT 396   Cardiac Enzymes: No results for input(s): CKTOTAL, CKMB, CKMBINDEX, TROPONINI in the last 168  hours.  BNP (last 3 results)  Recent Labs  04/19/15 1828  BNP 65.8    ProBNP (last 3 results) No results for input(s): PROBNP in the last 8760 hours.  CBG:  Recent Labs Lab 01/09/16 1633  GLUCAP 374*  Radiological Exams on Admission: Dg Chest 2 View  01/09/2016  CLINICAL DATA:  Possible sepsis.  Large foot infection. EXAM: CHEST  2 VIEW COMPARISON:  Chest radiograph 04/20/2015 FINDINGS: Low lung volumes. Status post median sternotomy. Stable cardiac and mediastinal contours. No consolidative pulmonary opacities. No pleural effusion or pneumothorax. Regional skeleton is unremarkable. IMPRESSION: No active cardiopulmonary disease. Electronically Signed   By: Lovey Newcomer M.D.   On: 01/09/2016 17:44   Dg Foot 2 Views Right  01/09/2016  CLINICAL DATA:  Possible sepsis.  Necrotic tissue and great toe. EXAM: RIGHT FOOT - 2 VIEW COMPARISON:  None. FINDINGS: No acute fracture. No dislocation. Mild vascular calcifications. There is mild soft tissue swelling about the forefoot but no emphysema. Minimal spurring at the inferior calcaneus. IMPRESSION: No acute bony pathology. Soft tissue swelling over the forefoot is noted. Electronically Signed   By: Marybelle Killings M.D.   On: 01/09/2016 17:46    EKG: Independently reviewed.  Abnormal findings:  Sinus rhythm, LBBB, not significantly changed from prior    Assessment/Plan  1. Sepsis secondary to diabetic foot wound - Meets sepsis criteria on admission with fever, tachycardia, leukocytosis, elevated lactate, and foot infection - 300 cc/kg NS bolus given in ED  - Blood cultures incubating  - Given sepsis on presentation, broad-spectrum abx with vancomycin and Zosyn were initiated in ED  - Radiographs negative for underlying bony abnormalities  - Initial lactate 3.58, trending  - Trend procalcitonin, CRP - Check arterial dopplers  - Follow-up cultures and tailor abx accordingly   2. Paroxysmal atrial fibrillation - In atrial fibrillation  currently with rate well-controlled  - CHADS-VASc 3 (age, HTN, CHF)  - Hold Eliquis and ASA 81 as surgical intervention on foot may be required (last doses am of 4/24) - Continue current-dose amiodarone    3. Type II DM  - Managed with Lantus 25 units qHS, Novolog 6-8 units TID with meals, and metformin 1000 mg BID  - A1c was 11.0% in October 2016, reflecting a lack of glycemic control at that time  - Continue Lantus with 20 units qHS, Humalog 4 units TID with meals, and high-intensity sliding-scale correctional - Check CBG with meals and qHS, adjust insulins prn  - Carb-modified diet when appropriate  - Update A1c, pending   4. Hypertension  - At goal currently; was initially on low side  - 30 cc/kg NS bolus given in ED  - Resume home-dose Coreg in am as appropriate     DVT ppx: SQ Heparin     Code Status: Full code Family Communication:  Yes, patient's significant other at bed side Disposition Plan: Admit to inpatient   Date of Service 01/09/2016    Vianne Bulls, MD [facility] Pager [phone]  If 7PM-7AM, please contact night-coverage [email or web address] Password [facility] 01/09/2016, 7:24 PM

## 2016-01-09 NOTE — ED Notes (Signed)
Pt c/o hyperglycemia and states his sugar was around 500 today. Pt also c/o abd pain with nausea and vomiting. Pt also has large black wound to R foot from the big toe to his arch. The rest of his foot his red and warm to touch. Pt able to move all of the toes on the R foot. Pt also has black area noted to R outer ankle. Pt foot cool to touch from mid foot to toes, unable to palpate pulse.

## 2016-01-10 ENCOUNTER — Inpatient Hospital Stay (HOSPITAL_COMMUNITY): Payer: Commercial Managed Care - HMO

## 2016-01-10 ENCOUNTER — Other Ambulatory Visit: Payer: Self-pay

## 2016-01-10 DIAGNOSIS — M79671 Pain in right foot: Secondary | ICD-10-CM

## 2016-01-10 DIAGNOSIS — I96 Gangrene, not elsewhere classified: Secondary | ICD-10-CM

## 2016-01-10 DIAGNOSIS — E1165 Type 2 diabetes mellitus with hyperglycemia: Secondary | ICD-10-CM

## 2016-01-10 LAB — CBC WITH DIFFERENTIAL/PLATELET
BASOS ABS: 0 10*3/uL (ref 0.0–0.1)
BASOS PCT: 0 %
EOS PCT: 0 %
Eosinophils Absolute: 0 10*3/uL (ref 0.0–0.7)
HCT: 38.7 % — ABNORMAL LOW (ref 39.0–52.0)
Hemoglobin: 12.9 g/dL — ABNORMAL LOW (ref 13.0–17.0)
LYMPHS PCT: 6 %
Lymphs Abs: 1.7 10*3/uL (ref 0.7–4.0)
MCH: 27.4 pg (ref 26.0–34.0)
MCHC: 33.3 g/dL (ref 30.0–36.0)
MCV: 82.2 fL (ref 78.0–100.0)
MONO ABS: 2 10*3/uL — AB (ref 0.1–1.0)
Monocytes Relative: 7 %
NEUTROS ABS: 25.2 10*3/uL — AB (ref 1.7–7.7)
Neutrophils Relative %: 87 %
Platelets: 324 10*3/uL (ref 150–400)
RBC: 4.71 MIL/uL (ref 4.22–5.81)
RDW: 13.6 % (ref 11.5–15.5)
WBC: 28.9 10*3/uL — AB (ref 4.0–10.5)

## 2016-01-10 LAB — URINALYSIS, ROUTINE W REFLEX MICROSCOPIC
Bilirubin Urine: NEGATIVE
KETONES UR: NEGATIVE mg/dL
LEUKOCYTES UA: NEGATIVE
Nitrite: NEGATIVE
PH: 5 (ref 5.0–8.0)
Protein, ur: NEGATIVE mg/dL
Specific Gravity, Urine: 1.023 (ref 1.005–1.030)

## 2016-01-10 LAB — C-REACTIVE PROTEIN: CRP: 24.2 mg/dL — AB (ref <1.0)

## 2016-01-10 LAB — HIV ANTIBODY (ROUTINE TESTING W REFLEX): HIV Screen 4th Generation wRfx: NONREACTIVE

## 2016-01-10 LAB — BASIC METABOLIC PANEL
ANION GAP: 13 (ref 5–15)
BUN: 22 mg/dL — AB (ref 6–20)
CALCIUM: 8.1 mg/dL — AB (ref 8.9–10.3)
CO2: 22 mmol/L (ref 22–32)
Chloride: 97 mmol/L — ABNORMAL LOW (ref 101–111)
Creatinine, Ser: 1.45 mg/dL — ABNORMAL HIGH (ref 0.61–1.24)
GFR calc Af Amer: 56 mL/min — ABNORMAL LOW (ref >60)
GFR, EST NON AFRICAN AMERICAN: 48 mL/min — AB (ref >60)
GLUCOSE: 352 mg/dL — AB (ref 65–99)
POTASSIUM: 3.5 mmol/L (ref 3.5–5.1)
Sodium: 132 mmol/L — ABNORMAL LOW (ref 135–145)

## 2016-01-10 LAB — URINE MICROSCOPIC-ADD ON

## 2016-01-10 LAB — PATHOLOGIST SMEAR REVIEW

## 2016-01-10 LAB — GLUCOSE, CAPILLARY
GLUCOSE-CAPILLARY: 310 mg/dL — AB (ref 65–99)
Glucose-Capillary: 220 mg/dL — ABNORMAL HIGH (ref 65–99)
Glucose-Capillary: 174 mg/dL — ABNORMAL HIGH (ref 65–99)
Glucose-Capillary: 194 mg/dL — ABNORMAL HIGH (ref 65–99)

## 2016-01-10 LAB — APTT: APTT: 44 s — AB (ref 24–37)

## 2016-01-10 LAB — SEDIMENTATION RATE: SED RATE: 80 mm/h — AB (ref 0–16)

## 2016-01-10 LAB — HEPARIN LEVEL (UNFRACTIONATED): Heparin Unfractionated: <0.1 IU/mL — ABNORMAL LOW (ref 0.30–0.70)

## 2016-01-10 LAB — PREALBUMIN: PREALBUMIN: 2.8 mg/dL — AB (ref 18–38)

## 2016-01-10 MED ORDER — DEXTROSE 5 % IV SOLN
1.0000 g | Freq: Three times a day (TID) | INTRAVENOUS | Status: DC
  Administered 2016-01-10 – 2016-01-11 (×4): 1 g via INTRAVENOUS
  Filled 2016-01-10 (×6): qty 1

## 2016-01-10 MED ORDER — HEPARIN (PORCINE) IN NACL 100-0.45 UNIT/ML-% IJ SOLN
1500.0000 [IU]/h | INTRAMUSCULAR | Status: DC
  Filled 2016-01-10: qty 250

## 2016-01-10 MED ORDER — SODIUM CHLORIDE 0.9% FLUSH
10.0000 mL | INTRAVENOUS | Status: DC | PRN
  Administered 2016-01-11: 10 mL
  Filled 2016-01-10: qty 40

## 2016-01-10 MED ORDER — INSULIN GLARGINE 100 UNIT/ML ~~LOC~~ SOLN
30.0000 [IU] | Freq: Every day | SUBCUTANEOUS | Status: DC
  Administered 2016-01-10 – 2016-01-12 (×2): 30 [IU] via SUBCUTANEOUS
  Filled 2016-01-10 (×5): qty 0.3

## 2016-01-10 MED ORDER — VANCOMYCIN HCL IN DEXTROSE 750-5 MG/150ML-% IV SOLN
750.0000 mg | Freq: Two times a day (BID) | INTRAVENOUS | Status: DC
  Administered 2016-01-10 – 2016-01-11 (×3): 750 mg via INTRAVENOUS
  Filled 2016-01-10 (×4): qty 150

## 2016-01-10 MED ORDER — HEPARIN (PORCINE) IN NACL 100-0.45 UNIT/ML-% IJ SOLN
2100.0000 [IU]/h | INTRAMUSCULAR | Status: DC
  Administered 2016-01-11 – 2016-01-12 (×3): 2100 [IU]/h via INTRAVENOUS
  Filled 2016-01-10 (×5): qty 250

## 2016-01-10 MED ORDER — HEPARIN BOLUS VIA INFUSION
3000.0000 [IU] | Freq: Once | INTRAVENOUS | Status: AC
  Administered 2016-01-10: 3000 [IU] via INTRAVENOUS
  Filled 2016-01-10: qty 3000

## 2016-01-10 MED ORDER — METRONIDAZOLE IN NACL 5-0.79 MG/ML-% IV SOLN
500.0000 mg | Freq: Three times a day (TID) | INTRAVENOUS | Status: DC
  Administered 2016-01-10 – 2016-01-14 (×14): 500 mg via INTRAVENOUS
  Filled 2016-01-10 (×15): qty 100

## 2016-01-10 NOTE — Clinical Social Work Note (Signed)
CSW received consult for patient needing access to meds, case manager will assist with this, CSW to sign off, please consult if other social work needs arise.  Jones Broom. [place], MSW, [place] 01/10/2016 8:25 AM

## 2016-01-10 NOTE — Progress Notes (Signed)
Utilization review completed.  

## 2016-01-10 NOTE — Patient Outreach (Signed)
[place] [facility]) Care Management  01/10/2016  Thomas Wright 1946/08/04 [id number]   Patient hospitalized on 01-09-16.  Hospital liaison made aware of hospitalization.    Plan: RN Health Coach will send notification to physician.   RN Health Coach will close current program.  [name], RN, MSN [place] [phone]

## 2016-01-10 NOTE — Progress Notes (Signed)
Pharmacy Antibiotic Note  Thomas Wright is a 70 y.o. male admitted on 01/09/2016 with diabetic foot infection.  Pharmacy has been consulted for vancomycin and cefepime dosing.  Plan: Vanc 1g and Zosyn 3.375g in ED. Vancomycin 750mg  IV every 12 hours.  Goal trough 15-20 mcg/mL.  Cefepime 1g IV every 8 hours.  Height: 6' (182.9 cm) Weight: 235 lb (106.595 kg) IBW/kg (Calculated) : 77.6  Temp (24hrs), Avg:99.2 F (37.3 C), Min:99 F (37.2 C), Max:99.3 F (37.4 C)   Recent Labs Lab 01/09/16 1642 01/09/16 1728 01/09/16 1955 01/09/16 2133 01/10/16 0436  WBC 28.3*  --   --   --  28.9*  CREATININE 1.14  --   --   --  1.45*  LATICACIDVEN  --  3.58* 1.9 1.2  --     Estimated Creatinine Clearance: 61.5 mL/min (by C-G formula based on Cr of 1.45).    Allergies  Allergen Reactions  . Amoxicill-Clarithro-Omeprazole Diarrhea and Nausea And Vomiting     Thank you for allowing pharmacy to be a part of this patient's care.  Wynona Neat, PharmD, BCPS  01/10/2016 7:45 AM

## 2016-01-10 NOTE — Consult Note (Signed)
   [facility] Inpatient Consult   01/10/2016  ZAKIR REBER [birth year] [id number]   Patient is active with [place] [facility] program. Please see chart review tab then notes for patient outreach details for [facility] team notes. Went to bedside. However, nursing was at bedside. Spoke with inpatient RNCM about patient being active with [place] [facility]. Will come back at another time to see patient.  Marthenia Rolling, MSN-Ed, RN,BSN [facility] Liaison [phone]

## 2016-01-10 NOTE — Consult Note (Signed)
ORTHOPAEDIC CONSULTATION  REQUESTING PHYSICIAN: Louellen Molder, MD  Chief Complaint: Dry gangrene right foot  HPI: Thomas Wright is a 70 y.o. male who presents with dry gangrene of the right foot which patient states he's had about 2 weeks. Patient is a diabetic with hypertension and a history of A. fib. Patient has been on Eliquis for the A. fib. Patient states he's had history of blood clots in his heart.  Past Medical History  Diagnosis Date  . Hypertension   . PAF (paroxysmal atrial fibrillation) ([place])     a. Postop 2008. b. recurrent afib 08/2014 with LAA clot noted on TEE. EF was down again at 25%. He was anticoagulated with Eliquis and placed on Amiodarone.He ultimately underwent TEE-DCCV 08/2014.  Marland Kitchen Hypertriglyceridemia   . Cardiomyopathy ([place])     EF originally 20%-now 45%, no CAD on cath  . Chronic combined systolic and diastolic CHF (congestive heart failure) ([place])     a. 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 Reported h/o cath in 2008 without significant CAD.  Marland Kitchen Type II diabetes mellitus ([place])   . Arthritis     .  Marland Kitchen Depression     "a little right now" (04/19/2015)  . S/P aortic valve replacement with bioprosthetic valve     a. bioprosthetic AVR in 05/2007 in [place].  . Hyperlipidemia    Past Surgical History  Procedure Laterality Date  . Aortic valve replacement  05/2007    with tissue graft  . I&d extremity Right 05/05/2014    Procedure: IRRIGATION AND DEBRIDEMENT EXTREMITY;  Surgeon: [name], MD;  Location: [place];  Service: Orthopedics;  Laterality: Right;  . Tee without cardioversion N/A 07/23/2014    Procedure: TRANSESOPHAGEAL ECHOCARDIOGRAM (TEE);  Surgeon: Josue Hector, MD;  Location: [facility] ENDOSCOPY;  Service: Cardiovascular;  Laterality: N/A;  . Cardioversion N/A 07/23/2014    Procedure: CARDIOVERSION;  Surgeon: Josue Hector, MD;  Location: [facility] ENDOSCOPY;  Service:  Cardiovascular;  Laterality: N/A;  . Tee without cardioversion N/A 09/01/2014    Procedure: TRANSESOPHAGEAL ECHOCARDIOGRAM (TEE);  Surgeon: [name], MD;  Location: [facility] ENDOSCOPY;  Service: Cardiovascular;  Laterality: N/A;  . Cardioversion N/A 09/01/2014    Procedure: CARDIOVERSION;  Surgeon: [name], MD;  Location: [facility] ENDOSCOPY;  Service: Cardiovascular;  Laterality: N/A;  . Amputation Right 07/29/2014    Procedure: AMPUTATION RIGHT LONG FINGER;  Surgeon: [name], MD;  Location: [place];  Service: Orthopedics;  Laterality: Right;  . Cardiac valve replacement    . Tonsillectomy  1954  . Cardiac catheterization  "several"   Social History   Social History  . Marital Status: Divorced    Spouse Name: N/A  . Number of Children: N/A  . Years of Education: N/A   Occupational History  . retired     Social History Main Topics  . Smoking status: Never Smoker   . Smokeless tobacco: Never Used  . Alcohol Use: 0.0 oz/week    0 Standard drinks or equivalent per week     Comment: 04/19/2015 "might have a beer or 2, 2-3 times/yr"  . Drug Use: No  . Sexual Activity: No   Other Topics Concern  . None   Social History Narrative   Family History  Problem Relation Age of Onset  . Colon cancer Father   . Diabetes Father   . Liver disease Brother   . Heart murmur  Child   . Congenital heart disease Other   . Diabetes Maternal Grandmother   . Heart disease Maternal Grandfather     A Fib   - negative except otherwise stated in the family history section Allergies  Allergen Reactions  . Amoxicill-Clarithro-Omeprazole Diarrhea and Nausea And Vomiting   Prior to Admission medications   Medication Sig Start Date End Date Taking? Authorizing Provider  amiodarone (PACERONE) 200 MG tablet Take 200 mg by mouth daily. One daily   Yes Historical Provider, MD  apixaban (ELIQUIS) 5 MG TABS tablet Take 1 tablet (5 mg total) by mouth 2 (two) times daily. 04/22/15  Yes Dayna N Dunn, PA-C    aspirin EC 81 MG tablet Take 1 tablet (81 mg total) by mouth daily. 04/22/15  Yes Dayna N Dunn, PA-C  atorvastatin (LIPITOR) 10 MG tablet Take 1 tablet (10 mg total) by mouth every morning. 06/01/15  Yes Jerline Pain, MD  carvedilol (COREG) 6.25 MG tablet Take 1 tablet (6.25 mg total) by mouth 2 (two) times daily with a meal. 08/17/15  Yes Jerline Pain, MD  DHA-EPA-Coenzyme Q10-Vitamin E (CO Q-10 VITAMIN E FISH OIL PO) Take 1,000 mg by mouth 2 (two) times daily.   Yes Historical Provider, MD  furosemide (LASIX) 20 MG tablet Take 1 tablet (20 mg total) by mouth daily. 08/23/15  Yes Jerline Pain, MD  guaiFENesin-dextromethorphan [facility] DM) 100-10 MG/5ML syrup Take 5 mLs by mouth at bedtime as needed for cough.   Yes Historical Provider, MD  insulin glargine (LANTUS) 100 UNIT/ML injection Inject 0.25 mLs (25 Units total) into the skin at bedtime. 09/23/15  Yes Elayne Snare, MD  metFORMIN (GLUCOPHAGE) 1000 MG tablet Take 1,000 mg by mouth 2 (two) times daily with a meal.   Yes Historical Provider, MD  Multiple Vitamin (MULTIVITAMIN) tablet Take 1 tablet by mouth daily.   Yes Historical Provider, MD  sildenafil (VIAGRA) 100 MG tablet Take 1 tablet (100 mg total) by mouth daily as needed for erectile dysfunction (1 hr prior to sexual activity). 10/27/15  Yes Jerline Pain, MD  Blood Glucose Monitoring Suppl (ACCU-CHEK NANO SMARTVIEW) w/Device KIT Use to check blood sugar 4 times per day dx code E11.9 01/09/16   Elayne Snare, MD  glucose blood (ACCU-CHEK SMARTVIEW) test strip Use as instructed to check blood sugar 4 times per day dx code E11.9 07/21/15   Elayne Snare, MD  Insulin Syringe-Needle U-100 31G X 5/16" 0.3 ML MISC Use 3 per day to inject insulin 06/20/15   Elayne Snare, MD   Dg Chest 2 View  01/09/2016  CLINICAL DATA:  Possible sepsis.  Large foot infection. EXAM: CHEST  2 VIEW COMPARISON:  Chest radiograph 04/20/2015 FINDINGS: Low lung volumes. Status post median sternotomy. Stable cardiac and mediastinal  contours. No consolidative pulmonary opacities. No pleural effusion or pneumothorax. Regional skeleton is unremarkable. IMPRESSION: No active cardiopulmonary disease. Electronically Signed   By: Lovey Newcomer M.D.   On: 01/09/2016 17:44   Dg Foot 2 Views Right  01/09/2016  CLINICAL DATA:  Possible sepsis.  Necrotic tissue and great toe. EXAM: RIGHT FOOT - 2 VIEW COMPARISON:  None. FINDINGS: No acute fracture. No dislocation. Mild vascular calcifications. There is mild soft tissue swelling about the forefoot but no emphysema. Minimal spurring at the inferior calcaneus. IMPRESSION: No acute bony pathology. Soft tissue swelling over the forefoot is noted. Electronically Signed   By: Marybelle Killings M.D.   On: 01/09/2016 17:46   - pertinent xrays,  CT, MRI studies were reviewed and independently interpreted  Positive ROS: All other systems have been reviewed and were otherwise negative with the exception of those mentioned in the HPI and as above.  Physical Exam: General: Alert, no acute distress Cardiovascular: No pedal edema Respiratory: No cyanosis, no use of accessory musculature GI: No organomegaly, abdomen is soft and non-tender Skin: Dry gangrene of the right foot involving almost the entire foot. Patient has a Wagner grade 5 ulceration.  Neurologic: Patient does not have protective sensation. Psychiatric: Patient is competent for consent with normal mood and affect Lymphatic: No axillary or cervical lymphadenopathy  MUSCULOSKELETAL:  On examination patient has a faintly palpable dorsalis pedis pulse. He has strong popliteal pulse. His ankle brachial indices shows adequate flow to the right lower extremity. Patient's gangrenous changes are most consistent with a thrombotic event most likely from his A. fib throwing o'clock down to the foot and ankle with acute ischemic changes. Patient has a white cell count of approximately 29,000 with a elevated CRP and sedimentation  rate.  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 fib. With good circulation to the right lower extremity.  Plan: We will plan for a transtibial amputation on Friday. Patient may have difficulty being discharged to home and I will request evaluation for rehabilitation placement postoperatively.  Thank you for the consult and the opportunity to see [name], [place] [phone] 5:12 PM

## 2016-01-10 NOTE — Progress Notes (Signed)
Initial Nutrition Assessment  DOCUMENTATION CODES:   Obesity unspecified  INTERVENTION:  -Pro-Stat 45mL BID, each supplement provides 15 grams protein and 100 calories w/ diet advancement -RD continue to monitor  NUTRITION DIAGNOSIS:   Inadequate oral intake related to poor appetite as evidenced by estimated needs.  GOAL:   Patient will meet greater than or equal to 90% of their needs  MONITOR:   I & O's, Labs, Weight trends, Diet advancement  REASON FOR ASSESSMENT:   Consult Wound healing  ASSESSMENT:   Thomas Wright is a 70 y.o. male with PMH of paroxysmal atrial fibrillation on Eliquis, insulin-dependent diabetes mellitus, hypertension, aortic regurgitation status post valve replacement, and recurrent soft tissue infections who presents to the ED with fevers, chills, right foot wound, and uncontrolled blood sugars.  Spoke with Mr Wright @ bedside. He was very upset during my visit because he has been receiving mixed information about his surgery today. He has been NPO since 10pm yesterday and was very hungry with a dry mouth. Pt did state he wasn't eating well for 2-3 weeks PTA and has been experiencing wt loss, though he was unsure how much. Per chart patient has experienced a 6.7/17# insignificant wt loss over 4 months. He is currently septic 2/2 his foot ulcer.  Nutrition-Focused physical exam completed. Findings are no fat depletion, no muscle depletion, and mild edema.   Labs and Medications reviewed.  Diet Order:  Diet NPO time specified  Skin:  Wound (see comment) (Cellulitis to R foot)  Last BM:  4/23  Height:   Ht Readings from Last 1 Encounters:  01/09/16 6' (1.829 m)    Weight:   Wt Readings from Last 1 Encounters:  01/09/16 235 lb (106.595 kg)    Ideal Body Weight:  80.9 kg  BMI:  Body mass index is 31.86 kg/(m^2).  Estimated Nutritional Needs:   Kcal:  2100-2600  Protein:  110-120 grams  Fluid:  >/= 2L  EDUCATION NEEDS:   No  education needs identified at this time  Satira Anis. Irish Piech, MS, RD LDN After Hours/Weekend Pager [phone]

## 2016-01-10 NOTE — Progress Notes (Signed)
ANTICOAGULATION CONSULT NOTE - Initial Consult  Pharmacy Consult for heparin Indication: atrial fibrillation  Allergies  Allergen Reactions  . Amoxicill-Clarithro-Omeprazole Diarrhea and Nausea And Vomiting    Patient Measurements: Height: 6' (182.9 cm) Weight: 235 lb (106.595 kg) IBW/kg (Calculated) : 77.6 Heparin Dosing Weight: 99.9  Vital Signs: Temp: 99.1 F (37.3 C) (04/25 1400) Temp Source: Axillary (04/25 1400) BP: 113/55 mmHg (04/25 1400) Pulse Rate: 72 (04/25 1400)  Labs:  Recent Labs  01/09/16 1642 01/09/16 1955 01/10/16 0436 01/10/16 1226  HGB 15.0  --  12.9*  --   HCT 43.9  --  38.7*  --   PLT 396  --  324  --   APTT  --  29  --  44*  LABPROT  --  16.9*  --   --   INR  --  1.36  --   --   HEPARINUNFRC  --   --   --  <0.10*  CREATININE 1.14  --  1.45*  --     Estimated Creatinine Clearance: 61.5 mL/min (by C-G formula based on Cr of 1.45).   Medical History: Past Medical History  Diagnosis Date  . Hypertension   . PAF (paroxysmal atrial fibrillation) ([place])     a. Postop 2008. b. recurrent afib 08/2014 with LAA clot noted on TEE. EF was down again at 25%. He was anticoagulated with Eliquis and placed on Amiodarone.He ultimately underwent TEE-DCCV 08/2014.  Marland Kitchen Hypertriglyceridemia   . Cardiomyopathy ([place])     EF originally 20%-now 45%, no CAD on cath  . Chronic combined systolic and diastolic CHF (congestive heart failure) ([place])     a. 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 Reported h/o cath in 2008 without significant CAD.  Marland Kitchen Type II diabetes mellitus ([place])   . Arthritis     .  Marland Kitchen Depression     "a little right now" (04/19/2015)  . S/P aortic valve replacement with bioprosthetic valve     a. bioprosthetic AVR in 05/2007 in [place].  . Hyperlipidemia     Medications:  Prescriptions prior to admission  Medication Sig Dispense Refill Last Dose  . amiodarone (PACERONE) 200  MG tablet Take 200 mg by mouth daily. One daily   Past Week at Unknown time  . apixaban (ELIQUIS) 5 MG TABS tablet Take 1 tablet (5 mg total) by mouth 2 (two) times daily. 60 tablet 3 Past Week at 0800  . aspirin EC 81 MG tablet Take 1 tablet (81 mg total) by mouth daily.   Past Week at Unknown time  . atorvastatin (LIPITOR) 10 MG tablet Take 1 tablet (10 mg total) by mouth every morning. 90 tablet 3 Past Week at Unknown time  . carvedilol (COREG) 6.25 MG tablet Take 1 tablet (6.25 mg total) by mouth 2 (two) times daily with a meal. 180 tablet 3 Past Week at 0800  . DHA-EPA-Coenzyme Q10-Vitamin E (CO Q-10 VITAMIN E FISH OIL PO) Take 1,000 mg by mouth 2 (two) times daily.   Past Week at Unknown time  . furosemide (LASIX) 20 MG tablet Take 1 tablet (20 mg total) by mouth daily. 90 tablet 3 Past Week at Unknown time  . guaiFENesin-dextromethorphan (ROBITUSSIN DM) 100-10 MG/5ML syrup Take 5 mLs by mouth at bedtime as needed for cough.   Past Week at Unknown time  . insulin glargine (LANTUS) 100 UNIT/ML injection Inject 0.25 mLs (25 Units  total) into the skin at bedtime. 10 mL 0 01/08/2016 at Unknown time  . metFORMIN (GLUCOPHAGE) 1000 MG tablet Take 1,000 mg by mouth 2 (two) times daily with a meal.   Past Week at Unknown time  . Multiple Vitamin (MULTIVITAMIN) tablet Take 1 tablet by mouth daily.   Past Week at Unknown time  . sildenafil (VIAGRA) 100 MG tablet Take 1 tablet (100 mg total) by mouth daily as needed for erectile dysfunction (1 hr prior to sexual activity). 6 tablet 12 unknown at unknown  . Blood Glucose Monitoring Suppl (ACCU-CHEK NANO SMARTVIEW) w/Device KIT Use to check blood sugar 4 times per day dx code E11.9 1 kit 0   . glucose blood (ACCU-CHEK SMARTVIEW) test strip Use as instructed to check blood sugar 4 times per day dx code E11.9 400 each 1 Taking  . Insulin Syringe-Needle U-100 31G X 5/16" 0.3 ML MISC Use 3 per day to inject insulin 100 each 3 Taking   Scheduled:  . amiodarone   200 mg Oral Daily  . atorvastatin  10 mg Oral q morning - 10a  . carvedilol  6.25 mg Oral BID WC  . ceFEPime (MAXIPIME) IV  1 g Intravenous Q8H  . insulin aspart  0-15 Units Subcutaneous TID WC  . insulin aspart  4 Units Subcutaneous TID WC  . insulin glargine  30 Units Subcutaneous QHS  . metronidazole  500 mg Intravenous Q8H  . multivitamin with minerals  1 tablet Oral Daily  . vancomycin  750 mg Intravenous Q12H    Assessment: 67 yoM, pharmacy consulted to start heparin in pt with AFib on apixaban PTA.  Holding Eliquis and ASA since admission d/t possible surgical intervention on foot. Last doses were morning of 4/24. Also did NOT receive subQ heparin this morning. Baseline anti-xa level today was undetectable so will use anti-xa to monitor heparin.  Goal of Therapy:  Heparin level 0.3-0.7 units/ml Monitor platelets by anticoagulation protocol: Yes   Plan:  Start heparin infusion at 1500 units/hr Check anti-Xa level in 6 hours and daily while on heparin Continue to monitor H&H and platelets   Thank you for allowing [place] to participate in this patients care. Jens Som, PharmD Pager: [phone]  01/10/2016,2:05 PM

## 2016-01-10 NOTE — Progress Notes (Signed)
PROGRESS NOTE                                                                                                                                                                                                             Patient Demographics:    Thomas Wright, is a 70 y.o. male, DOB - 12/05/1945, OZD:[id number]  Admit date - 01/09/2016   Admitting Physician Vianne Bulls, MD  Outpatient Primary MD for the patient is [name], MD  LOS - 1  Outpatient Specialists: Dr. Elayne Snare (endocrinology) [name] (cardiology)   Chief Complaint  Patient presents with  . Hyperglycemia  . Wound Infection       Brief Narrative   70 year old male with history of asthma A. fib on Eliquis, uncontrolled insulin-dependent diabetes mellitus with A1c >11, hypertension, aortic regurgitation status post valve replacement, Recurrent soft tissue infections presented to the ED with fever, chills with right foot wound and uncontrolled blood sugars. Patient followed in the [place]. for right plantar ulcer which he stopped going there stating that the staff there was little to him. He reports that the foot was normal until 2 weeks ago when he noticed it to be more swollen with occasional shooting pain and discoloration in the leg. Patient not sure if he had a sustained any trauma to the leg. On the day of admission he had increased malaise with fevers and chills with blood glucose in 300s-400s and came to the ED.  Patient was septic in the ED with temperature of 100.52F,  tachycardic, WBC of 28,000 and lactic acid of 3.58. Blood glucose was 380. ESR and CRP elevated (80/24.2).  Chest x-ray unremarkable and x-ray of the foot showed soft tissue swelling without bony abnormality. Sepsis pathway initiated in the ED and started on empiric antibiotics along with IV fluids. Patient admitted to hospitalist service   Subjective:    Patient seen and evaluated any from vascular lab. Denies any pain in his right leg and reports poor sensation.    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 Has low-grade fever this morning, leukocytosis persists but lactic acid normalized. Continue aggressive IV fluids. Switched antibiotics to IV vancomycin, cefepime and Flagyl for broader coverage. Follow blood culture. Elevated ESR and  CRP. ABI done shows arterial flow within normal limits in bilateral legs. Consulted Dr. Sharol Given for surgical evaluation. Wound care consult appreciated. Recommend surgical evaluation.  Active Problems: Uncontrolled insulin-dependent diabetes mellitus  A1c from 06/2015 was 11. CBG in the range of 300-400s at home. Suspect medication nonadherence. Patient is on metformin (held) and Novolin 3 times a day with meals. Needs tighter blood glucose control. Place on 30 units of Lantus and added milk always been patient starts to eat. Diabetic coordinator consulted.    PAF (paroxysmal atrial fibrillation) (HCC) Rate controlled. Hold Eliquis for possible surgery. Placed on IV heparin continue telemetry monitoring. Continue Coreg, amiodarone and aspirin.  Essential hypertension Resume Coreg.  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 Last echo in 04/2015 with EF of 50-55% with grade 1 diastolic dysfunction. Follows with Dr. Marlou Porch. Continue aspirin, statin and beta blocker.  Acute kidney injury Possibly with sepsis. Hold Lasix and monitor.  Status post bioprosthetic aortic valve replacement in 2008          Code Status : Full code  Family Communication  : None at bedside  Disposition Plan  : Pending hospital clinical course. Possibly by end of the week  Barriers For Discharge :  Sepsis with gangrene, awaiting surgical evaluation  Consults  :   Dr.  Sharol Given   Procedures  : ABI  DVT Prophylaxis  : IV heparin  Lab Results  Component Value Date   PLT 324 01/10/2016    Antibiotics  :    Anti-infectives    Start     Dose/Rate Route Frequency Ordered Stop   01/10/16 0815  metroNIDAZOLE (FLAGYL) IVPB 500 mg     500 mg 100 mL/hr over 60 Minutes Intravenous Every 8 hours 01/10/16 0731     01/10/16 0800  vancomycin (VANCOCIN) IVPB 750 mg/150 ml premix     750 mg 150 mL/hr over 60 Minutes Intravenous Every 12 hours 01/10/16 0748     01/10/16 0800  ceFEPIme (MAXIPIME) 1 g in dextrose 5 % 50 mL IVPB     1 g 100 mL/hr over 30 Minutes Intravenous Every 8 hours 01/10/16 0748     01/09/16 1930  cefTRIAXone (ROCEPHIN) 2 g in dextrose 5 % 50 mL IVPB  Status:  Discontinued     2 g 100 mL/hr over 30 Minutes Intravenous Every 24 hours 01/09/16 1924 01/10/16 0732   01/09/16 1930  metroNIDAZOLE (FLAGYL) IVPB 500 mg  Status:  Discontinued     500 mg 100 mL/hr over 60 Minutes Intravenous Every 8 hours 01/09/16 1924 01/10/16 0732   01/09/16 1715  piperacillin-tazobactam (ZOSYN) IVPB 3.375 g     3.375 g 100 mL/hr over 30 Minutes Intravenous  Once 01/09/16 1710 01/09/16 1843   01/09/16 1715  vancomycin (VANCOCIN) IVPB 1000 mg/200 mL premix     1,000 mg 200 mL/hr over 60 Minutes Intravenous  Once 01/09/16 1710 01/09/16 1946        Objective:   Filed Vitals:   01/09/16 1945 01/09/16 1946 01/09/16 2055 01/10/16 0545  BP: 131/62  130/60 134/68  Pulse: 73 73 72 73  Temp:   99.3 F (37.4 C) 99 F (37.2 C)  TempSrc:   Oral   Resp: 25 17 17 17   Height:      Weight:      SpO2:  99% 99% 99%    Wt Readings from Last 3 Encounters:  01/09/16 106.595 kg (235 lb)  01/04/16 106.595 kg (235 lb)  11/18/15 108.41 kg (239 lb)     Intake/Output Summary (Last 24 hours) at 01/10/16 1200 Last data filed at 01/10/16 0800  Gross per 24 hour  Intake    220 ml  Output    500 ml  Net   -280 ml     Physical Exam  Gen: not in distress HEENT: no  pallor, moist mucosa, supple neck Chest: clear b/l, no added sounds CVS: N S1&S2,Systolic murmur 3/6, rubs or gallop GI: soft, NT, ND, BS+ Musculoskeletal:Warmth with some erythema on the right leg, gangrene involving medial aspect of the right foot, distal pulses not palpable manually, minimal sensation, diminished sensation in the left foot with good distal pulses  RRN:HAFBX and oriented, nonfocal   Data Review:    CBC  Recent Labs Lab 01/09/16 1642 01/10/16 0436  WBC 28.3* 28.9*  HGB 15.0 12.9*  HCT 43.9 38.7*  PLT 396 324  MCV 83.5 82.2  MCH 28.5 27.4  MCHC 34.2 33.3  RDW 13.8 13.6  LYMPHSABS 1.4 1.7  MONOABS 1.7* 2.0*  EOSABS 0.0 0.0  BASOSABS 0.0 0.0    Chemistries   Recent Labs Lab 01/09/16 1642 01/10/16 0436  NA 135 132*  K 4.2 3.5  CL 92* 97*  CO2 23 22  GLUCOSE 328* 352*  BUN 17 22*  CREATININE 1.14 1.45*  CALCIUM 9.4 8.1*  AST 16  --   ALT 11*  --   ALKPHOS 142*  --   BILITOT 1.4*  --    ------------------------------------------------------------------------------------------------------------------ No results for input(s): CHOL, HDL, LDLCALC, TRIG, CHOLHDL, LDLDIRECT in the last 72 hours.  Lab Results  Component Value Date   HGBA1C 11.0 06/20/2015   ------------------------------------------------------------------------------------------------------------------ No results for input(s): TSH, T4TOTAL, T3FREE, THYROIDAB in the last 72 hours.  Invalid input(s): FREET3 ------------------------------------------------------------------------------------------------------------------ No results for input(s): VITAMINB12, FOLATE, FERRITIN, TIBC, IRON, RETICCTPCT in the last 72 hours.  Coagulation profile  Recent Labs Lab 01/09/16 1955  INR 1.36    No results for input(s): DDIMER in the last 72 hours.  Cardiac Enzymes No results for input(s): CKMB, TROPONINI, MYOGLOBIN in the last 168 hours.  Invalid input(s):  CK ------------------------------------------------------------------------------------------------------------------    Component Value Date/Time   BNP 65.8 04/19/2015 1828    Inpatient Medications  Scheduled Meds: . amiodarone  200 mg Oral Daily  . atorvastatin  10 mg Oral q morning - 10a  . carvedilol  6.25 mg Oral BID WC  . ceFEPime (MAXIPIME) IV  1 g Intravenous Q8H  . insulin aspart  0-15 Units Subcutaneous TID WC  . insulin aspart  4 Units Subcutaneous TID WC  . insulin glargine  20 Units Subcutaneous QHS  . metronidazole  500 mg Intravenous Q8H  . multivitamin with minerals  1 tablet Oral Daily  . vancomycin  750 mg Intravenous Q12H   Continuous Infusions: . heparin     PRN Meds:.acetaminophen **OR** acetaminophen, bisacodyl, HYDROcodone-acetaminophen, morphine injection, morphine injection, ondansetron **OR** ondansetron (ZOFRAN) IV, polyethylene glycol  Micro Results Recent Results (from the past 240 hour(s))  Culture, blood (Routine x 2)     Status: None (Preliminary result)   Collection Time: 01/09/16  4:42 PM  Result Value Ref Range Status   Specimen Description BLOOD RIGHT ANTECUBITAL  Final   Special Requests BOTTLES DRAWN AEROBIC AND ANAEROBIC 5CC  Final   Culture NO GROWTH < 24 HOURS  Final   Report Status PENDING  Incomplete  Culture, blood (Routine x 2)     Status: None (Preliminary result)  Collection Time: 01/09/16  8:15 PM  Result Value Ref Range Status   Specimen Description BLOOD RIGHT HAND  Final   Special Requests BOTTLES DRAWN AEROBIC AND ANAEROBIC 5CC  Final   Culture NO GROWTH < 24 HOURS  Final   Report Status PENDING  Incomplete    Radiology Reports Dg Chest 2 View  01/09/2016  CLINICAL DATA:  Possible sepsis.  Large foot infection. EXAM: CHEST  2 VIEW COMPARISON:  Chest radiograph 04/20/2015 FINDINGS: Low lung volumes. Status post median sternotomy. Stable cardiac and mediastinal contours. No consolidative pulmonary opacities. No pleural  effusion or pneumothorax. Regional skeleton is unremarkable. IMPRESSION: No active cardiopulmonary disease. Electronically Signed   By: Lovey Newcomer M.D.   On: 01/09/2016 17:44   Dg Foot 2 Views Right  01/09/2016  CLINICAL DATA:  Possible sepsis.  Necrotic tissue and great toe. EXAM: RIGHT FOOT - 2 VIEW COMPARISON:  None. FINDINGS: No acute fracture. No dislocation. Mild vascular calcifications. There is mild soft tissue swelling about the forefoot but no emphysema. Minimal spurring at the inferior calcaneus. IMPRESSION: No acute bony pathology. Soft tissue swelling over the forefoot is noted. Electronically Signed   By: Marybelle Killings M.D.   On: 01/09/2016 17:46    Time Spent in minutes  25   Louellen Molder M.D on 01/10/2016 at 12:00 PM  Between 7am to 7pm - Pager - [phone]  After 7pm go to [email or web address] - password [facility]  [facility] -  Office  [phone]

## 2016-01-10 NOTE — Progress Notes (Addendum)
VASCULAR LAB PRELIMINARY  ARTERIAL  ABI completed:    RIGHT    LEFT    PRESSURE WAVEFORM  PRESSURE WAVEFORM  BRACHIAL 132 Triphasic BRACHIAL 128 Triphasic  DP 138 Biphasic DP 167 Triphasic  PT 117 Biphasic PT 173 Triphasic    RIGHT LEFT  ABI 1.05 1.31   ABIs indicate arterial flow within normal limits bilaterally at rest.  Kathleena Freeman, RVS 01/10/2016, 10:53 AM

## 2016-01-10 NOTE — Progress Notes (Signed)
Inpatient Diabetes Program Recommendations  AACE/ADA: New Consensus Statement on Inpatient Glycemic Control (2015)  Target Ranges:  Prepandial:   less than 140 mg/dL      Peak postprandial:   less than 180 mg/dL (1-2 hours)      Critically ill patients:  140 - 180 mg/dL   Review of Glycemic Control Home insulin regimen per outpatient CDE note 06/20/15: Lantus 30, Regular 6 units w BF, 6 units with lunch, 8 units with supper Inpatient Diabetes Program Recommendations:  Insulin - Basal: Increase Lantus to 30 units  Insulin - Meal Coverage: Increase Novolog with meals to 6 units TID Thank you  Raoul Pitch BSN, RN,CDE Inpatient Diabetes Coordinator [phone] (team pager)

## 2016-01-10 NOTE — Consult Note (Addendum)
WOC wound consult note Reason for Consult: Consult requested for right foot wound. It is 100% necrotic with dry eschar; pt states it evolved to this stage rapidly over the past few days. ABI is pending, according to the EMR. Wound type: Full thickness Measurement: Affected area involved toes to the arch of the foot; approx 17X10cm Drainage (amount, consistency, odor) Small amt tan drainage and odor. Dressing procedure/placement/frequency: Topical treatment will not be effective for this wound; recommend ortho consult as soon as possible for surgical intervention.  Discussed plan of care with primary team. Please re-consult if further assistance is needed.  Thank-you,  Julien Girt MSN, [place], [place], [place], [place]

## 2016-01-10 NOTE — Progress Notes (Signed)
[place] for heparin Indication: atrial fibrillation  Allergies  Allergen Reactions  . Amoxicill-Clarithro-Omeprazole Diarrhea and Nausea And Vomiting    Patient Measurements: Height: 6' (182.9 cm) Weight: 235 lb (106.595 kg) IBW/kg (Calculated) : 77.6 Heparin Dosing Weight: 99.9  Vital Signs: Temp: 98.4 F (36.9 C) (04/25 2153) Temp Source: Oral (04/25 2153) BP: 111/50 mmHg (04/25 2153) Pulse Rate: 68 (04/25 2153)  Labs:  Recent Labs  01/09/16 1642 01/09/16 1955 01/10/16 0436 01/10/16 1226 01/10/16 2024  HGB 15.0  --  12.9*  --   --   HCT 43.9  --  38.7*  --   --   PLT 396  --  324  --   --   APTT  --  29  --  44*  --   LABPROT  --  16.9*  --   --   --   INR  --  1.36  --   --   --   HEPARINUNFRC  --   --   --  <0.10* <0.10*  CREATININE 1.14  --  1.45*  --   --     Estimated Creatinine Clearance: 61.5 mL/min (by C-G formula based on Cr of 1.45).   Scheduled:  . amiodarone  200 mg Oral Daily  . atorvastatin  10 mg Oral q morning - 10a  . carvedilol  6.25 mg Oral BID WC  . ceFEPime (MAXIPIME) IV  1 g Intravenous Q8H  . insulin aspart  0-15 Units Subcutaneous TID WC  . insulin aspart  4 Units Subcutaneous TID WC  . insulin glargine  30 Units Subcutaneous QHS  . metronidazole  500 mg Intravenous Q8H  . multivitamin with minerals  1 tablet Oral Daily  . vancomycin  750 mg Intravenous Q12H    Assessment: 54 yoM, pharmacy consulted to start heparin in pt with AFib on apixaban PTA.  Holding Eliquis and ASA since admission d/t possible surgical intervention on foot. Last dose was 4/24.  -heparin level is undetectable on 1500 units/hr  Goal of Therapy:  Heparin level 0.3-0.7 units/ml Monitor platelets by anticoagulation protocol: Yes   Plan:   -heparin bolus 3000 units x1 -Increase heparin to 1800 units/hr -Heparin level in 6 hours and daily wth CBC daily  Hildred Laser, Pharm D 01/10/2016 10:07 PM

## 2016-01-11 ENCOUNTER — Inpatient Hospital Stay (HOSPITAL_COMMUNITY): Payer: Commercial Managed Care - HMO

## 2016-01-11 DIAGNOSIS — L089 Local infection of the skin and subcutaneous tissue, unspecified: Secondary | ICD-10-CM

## 2016-01-11 DIAGNOSIS — E119 Type 2 diabetes mellitus without complications: Secondary | ICD-10-CM

## 2016-01-11 DIAGNOSIS — E1169 Type 2 diabetes mellitus with other specified complication: Secondary | ICD-10-CM

## 2016-01-11 DIAGNOSIS — Z794 Long term (current) use of insulin: Secondary | ICD-10-CM

## 2016-01-11 DIAGNOSIS — A419 Sepsis, unspecified organism: Principal | ICD-10-CM

## 2016-01-11 LAB — URINE CULTURE

## 2016-01-11 LAB — GLUCOSE, CAPILLARY
GLUCOSE-CAPILLARY: 211 mg/dL — AB (ref 65–99)
GLUCOSE-CAPILLARY: 193 mg/dL — AB (ref 65–99)
Glucose-Capillary: 163 mg/dL — ABNORMAL HIGH (ref 65–99)
Glucose-Capillary: 156 mg/dL — ABNORMAL HIGH (ref 65–99)

## 2016-01-11 LAB — BASIC METABOLIC PANEL
ANION GAP: 8 (ref 5–15)
BUN: 30 mg/dL — AB (ref 6–20)
CO2: 24 mmol/L (ref 22–32)
Calcium: 8.1 mg/dL — ABNORMAL LOW (ref 8.9–10.3)
Chloride: 100 mmol/L — ABNORMAL LOW (ref 101–111)
Creatinine, Ser: 2.16 mg/dL — ABNORMAL HIGH (ref 0.61–1.24)
GFR calc Af Amer: 34 mL/min — ABNORMAL LOW (ref >60)
GFR, EST NON AFRICAN AMERICAN: 30 mL/min — AB (ref >60)
GLUCOSE: 193 mg/dL — AB (ref 65–99)
POTASSIUM: 5 mmol/L (ref 3.5–5.1)
Sodium: 132 mmol/L — ABNORMAL LOW (ref 135–145)

## 2016-01-11 LAB — CBC
HEMATOCRIT: 36.9 % — AB (ref 39.0–52.0)
Hemoglobin: 12.1 g/dL — ABNORMAL LOW (ref 13.0–17.0)
MCH: 27.6 pg (ref 26.0–34.0)
MCHC: 32.8 g/dL (ref 30.0–36.0)
MCV: 84.1 fL (ref 78.0–100.0)
PLATELETS: 316 10*3/uL (ref 150–400)
RBC: 4.39 MIL/uL (ref 4.22–5.81)
RDW: 14.1 % (ref 11.5–15.5)
WBC: 27.5 10*3/uL — ABNORMAL HIGH (ref 4.0–10.5)

## 2016-01-11 LAB — CK: Total CK: 61 U/L (ref 49–397)

## 2016-01-11 LAB — HEMOGLOBIN A1C
HEMOGLOBIN A1C: 12.6 % — AB (ref 4.8–5.6)
Mean Plasma Glucose: 315 mg/dL

## 2016-01-11 LAB — HEPARIN LEVEL (UNFRACTIONATED)
Heparin Unfractionated: 0.15 IU/mL — ABNORMAL LOW (ref 0.30–0.70)
Heparin Unfractionated: 0.3 IU/mL (ref 0.30–0.70)

## 2016-01-11 MED ORDER — PRO-STAT SUGAR FREE PO LIQD
30.0000 mL | Freq: Two times a day (BID) | ORAL | Status: DC
  Administered 2016-01-11 – 2016-01-15 (×8): 30 mL via ORAL
  Filled 2016-01-11 (×11): qty 30

## 2016-01-11 MED ORDER — SODIUM CHLORIDE 0.9 % IV SOLN
INTRAVENOUS | Status: DC
  Administered 2016-01-11 – 2016-01-13 (×2): via INTRAVENOUS

## 2016-01-11 MED ORDER — VANCOMYCIN HCL 10 G IV SOLR
1250.0000 mg | INTRAVENOUS | Status: DC
  Administered 2016-01-12: 1250 mg via INTRAVENOUS
  Filled 2016-01-11: qty 1250

## 2016-01-11 MED ORDER — DEXTROSE 5 % IV SOLN
1.0000 g | Freq: Two times a day (BID) | INTRAVENOUS | Status: DC
  Administered 2016-01-11 – 2016-01-12 (×2): 1 g via INTRAVENOUS
  Filled 2016-01-11 (×5): qty 1

## 2016-01-11 NOTE — Progress Notes (Signed)
Inpatient Diabetes Program Recommendations  AACE/ADA: New Consensus Statement on Inpatient Glycemic Control (2015)  Target Ranges:  Prepandial:   less than 140 mg/dL      Peak postprandial:   less than 180 mg/dL (1-2 hours)      Critically ill patients:  140 - 180 mg/dL   Attempted to see patient in regards to his A1c. Patient has seen Dr. Dwyane Dee in the past for DM management and last saw him per [facility] October 2016. Patient uncontrolled at that time and counseled at length about DM. At the time patient saw Dr. Dwyane Dee, patient was verbalizing difficulty affording Lantus at that time and was taking a smaller dose than prescribed. Patient and bed absent from room will check back later.  Thanks,  Tama Headings RN, MSN, [facility] Inpatient Diabetes Coordinator Team Pager [phone] (8a-5p)

## 2016-01-11 NOTE — Progress Notes (Signed)
Nutrition Follow-up  DOCUMENTATION CODES:   Obesity unspecified  INTERVENTION:  Provide 30 ml Prostat po BID, each supplement provides 100 kcal and 15 grams of protein.   Encourage adequate PO intake.   NUTRITION DIAGNOSIS:   Inadequate oral intake related to poor appetite as evidenced by estimated needs; ongoing  GOAL:   Patient will meet greater than or equal to 90% of their needs; progressing  MONITOR:   PO intake, Supplement acceptance, Weight trends, Labs, I & O's, Skin  REASON FOR ASSESSMENT:   Consult Wound healing  ASSESSMENT:   MCNEIL VALLO is a 70 y.o. male with PMH of paroxysmal atrial fibrillation on Eliquis, insulin-dependent diabetes mellitus, hypertension, aortic regurgitation status post valve replacement, and recurrent soft tissue infections who presents to the ED with fevers, chills, right foot wound, and uncontrolled blood sugars.  Plans for transtibial amputation on Friday. Meal completion has been 90%. RD to order Prostat to aid adequate protein needs for wound healing. RD to continue to monitor.    Labs and medications reviewed.   Diet Order:  Diet Carb Modified Fluid consistency:: Thin; Room service appropriate?: Yes  Skin:  Wound (see comment) (Diabetic ulcer R foot)  Last BM:  4/23  Height:   Ht Readings from Last 1 Encounters:  01/09/16 6' (1.829 m)    Weight:   Wt Readings from Last 1 Encounters:  01/11/16 223 lb 4.8 oz (101.288 kg)    Ideal Body Weight:  80.9 kg  BMI:  Body mass index is 30.28 kg/(m^2).  Estimated Nutritional Needs:   Kcal:  2200-2400  Protein:  110-120 grams  Fluid:  >/= 2L  EDUCATION NEEDS:   No education needs identified at this time  Corrin Parker, MS, RD, LDN Pager # [phone] After hours/ weekend pager # [phone]

## 2016-01-11 NOTE — Progress Notes (Signed)
[facility] Progress Note  Patient: Thomas Wright OZD:[id number]   PCP: [name], MD DOB: May 30, 1946   DOA: 01/09/2016   DOS: 01/11/2016   Date of Service: the patient was seen and examined on 01/11/2016 Outpatient Specialists: Tana Conch cardiology Pulmonary Praveen mannam  Subjective: Patient mentions this pain has been well controlled. Denies having any nausea or vomiting. Patient mentions he is eating less and his usual. No diarrhea. Nutrition: Tolerating oral diet  Brief hospital course: Patient was admitted on 01/09/2016, with complaint of fever chills and right foot discoloration, was found to have right foot gangrene with sepsis. Was started on broad-spectrum antibiotics, orthopedic was consulted and recommended transtibial amputation which is scheduled on 01/13/2016. Patient has chronic right plantar ulcer and has been following up with the wound care center but he stopped going there since last 2 weeks prior to admission. Patient also has mild worsening of his chronic kidney disease Currently further plan is continue IV hydration and monitor renal function and continue IV antibiotics.  Assessment and Plan: 1. Diabetic foot infection ([place]) Met sepsis criteria on admission with fever, leukocytosis. Currently on vancomycin and cefepime and Flagyl. Follow blood cultures. Orthopedic has been consulted and the patient is scheduled for transfer to be an amputation on 01/13/2016. ABI shows arch or floor within normal limits. Persistent leukocytosis but improving.  2. Acute kidney injury. Most likely in the setting of sepsis. CK normal. Continue IV hydration. Monitor renal function. Discontinue Lasix.  3. Chronic combined CHF. Recent acute cardiac program in August 2016 shows normal ejection fraction 50-55% with grade 1 diastolic dysfunction. Continue aspirin and statin and beta blocker.  4.Atrial fibrillation CHA2DS2-VASc Score 4 We'll continue amiodarone and  Coreg as well as aspirin  On Eliquis which is on hold for surgery Continue heparin infusion therapeutic dose  5. Uncontrolled insulin-dependent type 2 diabetes mellitus. Hemoglobin A1c 12.6 on April 24. Blood sugar mildly elevated. Agent has been without insulin for last 2-3 months prior to admission. Patient received 31 units of insulin aspart last 24 hours. We will increase Lantus to 40 units  Activity: physical therapy consulted Bowel regimen: last BM 01/08/2016 Diet: Cardiac and diabetic diet DVT Prophylaxis: on therapeutic anticoagulation.  Advance goals of care discussion: Full code  Family Communication: no family was present at bedside, at the time of interview.   Disposition:  Expected discharge date: 01/16/2016 Barriers to safe discharge: Postoperative course  Consultants: Orthopedics Procedures: None  Antibiotics: Anti-infectives    Start     Dose/Rate Route Frequency Ordered Stop   01/12/16 0500  vancomycin (VANCOCIN) 1,250 mg in sodium chloride 0.9 % 250 mL IVPB     1,250 mg 166.7 mL/hr over 90 Minutes Intravenous Every 24 hours 01/11/16 1353     01/11/16 1800  ceFEPIme (MAXIPIME) 1 g in dextrose 5 % 50 mL IVPB     1 g 100 mL/hr over 30 Minutes Intravenous Every 12 hours 01/11/16 1354     01/10/16 0815  metroNIDAZOLE (FLAGYL) IVPB 500 mg     500 mg 100 mL/hr over 60 Minutes Intravenous Every 8 hours 01/10/16 0731     01/10/16 0800  vancomycin (VANCOCIN) IVPB 750 mg/150 ml premix  Status:  Discontinued     750 mg 150 mL/hr over 60 Minutes Intravenous Every 12 hours 01/10/16 0748 01/11/16 1353   01/10/16 0800  ceFEPIme (MAXIPIME) 1 g in dextrose 5 % 50 mL IVPB  Status:  Discontinued     1 g 100 mL/hr over  30 Minutes Intravenous Every 8 hours 01/10/16 0748 01/11/16 1354   01/09/16 1930  cefTRIAXone (ROCEPHIN) 2 g in dextrose 5 % 50 mL IVPB  Status:  Discontinued     2 g 100 mL/hr over 30 Minutes Intravenous Every 24 hours 01/09/16 1924 01/10/16 0732    01/09/16 1930  metroNIDAZOLE (FLAGYL) IVPB 500 mg  Status:  Discontinued     500 mg 100 mL/hr over 60 Minutes Intravenous Every 8 hours 01/09/16 1924 01/10/16 0732   01/09/16 1715  piperacillin-tazobactam (ZOSYN) IVPB 3.375 g     3.375 g 100 mL/hr over 30 Minutes Intravenous  Once 01/09/16 1710 01/09/16 1843   01/09/16 1715  vancomycin (VANCOCIN) IVPB 1000 mg/200 mL premix     1,000 mg 200 mL/hr over 60 Minutes Intravenous  Once 01/09/16 1710 01/09/16 1946        Intake/Output Summary (Last 24 hours) at 01/11/16 1646 Last data filed at 01/11/16 1552  Gross per 24 hour  Intake    470 ml  Output    610 ml  Net   -140 ml   Filed Weights   01/09/16 1641 01/11/16 0351  Weight: 106.595 kg (235 lb) 101.288 kg (223 lb 4.8 oz)    Objective: Physical Exam: Filed Vitals:   01/10/16 1400 01/10/16 2153 01/11/16 0351 01/11/16 1500  BP: 113/55 111/50 139/67 128/68  Pulse: 72 68 69 72  Temp: 99.1 F (37.3 C) 98.4 F (36.9 C) 98.2 F (36.8 C) 98.1 F (36.7 C)  TempSrc: Axillary Oral Oral Oral  Resp: _0 Height:      Weight:   101.288 kg (223 lb 4.8 oz)   SpO2: 95% 92% 97% 97%     General: Appear in mild distress, no Rash; Oral Mucosa moist. Cardiovascular: S1 and S2 Present, no Murmur, no JVD Respiratory: Bilateral Air entry present and Clear to Auscultation, no Crackles, no wheezes Abdomen: Bowel Sound present, Soft and no tenderness Extremities: bilateral Pedal edema, no calf tenderness Neurology: Grossly no focal neuro deficit.  Data Reviewed: CBC:  Recent Labs Lab 01/09/16 1642 01/10/16 0436 01/11/16 0420  WBC 28.3* 28.9* 27.5*  NEUTROABS 25.2* 25.2*  --   HGB 15.0 12.9* 12.1*  HCT 43.9 38.7* 36.9*  MCV 83.5 82.2 84.1  PLT 396 324 179   Basic Metabolic Panel:  Recent Labs Lab 01/09/16 1642 01/10/16 0436 01/11/16 0420  NA 135 132* 132*  K 4.2 3.5 5.0  CL 92* 97* 100*  CO2 _1 GLUCOSE 328* 352* 193*  BUN 17 22* 30*  CREATININE 1.14 1.45*  2.16*  CALCIUM 9.4 8.1* 8.1*   GFR: Estimated Creatinine Clearance: 40.3 mL/min (by C-G formula based on Cr of 2.16). Liver Function Tests:  Recent Labs Lab 01/09/16 1642  AST 16  ALT 11*  ALKPHOS 142*  BILITOT 1.4*  PROT 8.6*  ALBUMIN 2.6*   No results for input(s): LIPASE, AMYLASE in the last 168 hours. No results for input(s): AMMONIA in the last 168 hours. Coagulation Profile:  Recent Labs Lab 01/09/16 1955  INR 1.36   Cardiac Enzymes:  Recent Labs Lab 01/11/16 0420  CKTOTAL 61   BNP (last 3 results) No results for input(s): PROBNP in the last 8760 hours. HbA1C:  Recent Labs  01/09/16 2120  HGBA1C 12.6*   CBG:  Recent Labs Lab 01/10/16 1133 01/10/16 1620 01/10/16 2144 01/11/16 0626 01/11/16 1158  GLUCAP 220* 174* 194* 163* 211*   Lipid Profile: No results for input(s): CHOL,  HDL, LDLCALC, TRIG, CHOLHDL, LDLDIRECT in the last 72 hours. Thyroid Function Tests: No results for input(s): TSH, T4TOTAL, FREET4, T3FREE, THYROIDAB in the last 72 hours. Anemia Panel: No results for input(s): VITAMINB12, FOLATE, FERRITIN, TIBC, IRON, RETICCTPCT in the last 72 hours. Urine analysis:    Component Value Date/Time   COLORURINE YELLOW 01/10/2016 0845   APPEARANCEUR CLOUDY* 01/10/2016 0845   LABSPEC 1.023 01/10/2016 0845   PHURINE 5.0 01/10/2016 0845   GLUCOSEU >1000* 01/10/2016 0845   HGBUR TRACE* 01/10/2016 0845   BILIRUBINUR NEGATIVE 01/10/2016 0845   KETONESUR NEGATIVE 01/10/2016 0845   PROTEINUR NEGATIVE 01/10/2016 0845   UROBILINOGEN 0.2 07/28/2014 1739   NITRITE NEGATIVE 01/10/2016 0845   LEUKOCYTESUR NEGATIVE 01/10/2016 0845   Sepsis Labs: _0 (procalcitonin:4,lacticidven:4)  ) Recent Results (from the past 240 hour(s))  Culture, blood (Routine x 2)     Status: None (Preliminary result)   Collection Time: 01/09/16  4:42 PM  Result Value Ref Range Status   Specimen Description BLOOD RIGHT ANTECUBITAL  Final   Special Requests BOTTLES  DRAWN AEROBIC AND ANAEROBIC 5CC  Final   Culture NO GROWTH 2 DAYS  Final   Report Status PENDING  Incomplete  Culture, blood (Routine x 2)     Status: None (Preliminary result)   Collection Time: 01/09/16  8:15 PM  Result Value Ref Range Status   Specimen Description BLOOD RIGHT HAND  Final   Special Requests BOTTLES DRAWN AEROBIC AND ANAEROBIC 5CC  Final   Culture NO GROWTH 2 DAYS  Final   Report Status PENDING  Incomplete  Urine culture     Status: Abnormal   Collection Time: 01/10/16  8:44 AM  Result Value Ref Range Status   Specimen Description URINE, CLEAN CATCH  Final   Special Requests NONE  Final   Culture 2,000 COLONIES/mL INSIGNIFICANT GROWTH (A)  Final   Report Status 01/11/2016 FINAL  Final      Studies: US Renal  01/11/2016  CLINICAL DATA:  Acute renal injury EXAM: RENAL / URINARY TRACT ULTRASOUND COMPLETE COMPARISON:  None. FINDINGS: Right Kidney: Length: 14.6 cm.  1.7 cm cyst is noted in the lower pole. Left Kidney: Length: 13.2 cm. Echogenicity within normal limits. No mass or hydronephrosis visualized. Bladder: Appears normal for degree of bladder distention. IMPRESSION: Right renal cyst.  No acute abnormality is noted. Electronically Signed   By: Inez Catalina M.D.   On: 01/11/2016 10:38     Scheduled Meds: . amiodarone  200 mg Oral Daily  . atorvastatin  10 mg Oral q morning - 10a  . carvedilol  6.25 mg Oral BID WC  . ceFEPime (MAXIPIME) IV  1 g Intravenous Q12H  . feeding supplement (PRO-STAT SUGAR FREE 64)  30 mL Oral BID  . insulin aspart  0-15 Units Subcutaneous TID WC  . insulin aspart  4 Units Subcutaneous TID WC  . insulin glargine  30 Units Subcutaneous QHS  . metronidazole  500 mg Intravenous Q8H  . multivitamin with minerals  1 tablet Oral Daily  . [START ON 01/12/2016] vancomycin  1,250 mg Intravenous Q24H   Continuous Infusions: . sodium chloride    . heparin 2,100 Units/hr (01/11/16 0601)   PRN Meds: acetaminophen **OR** acetaminophen,  HYDROcodone-acetaminophen, morphine injection, ondansetron **OR** ondansetron (ZOFRAN) IV, polyethylene glycol, sodium chloride flush  Time spent: 30 minutes  Author: Berle Mull, MD [facility] Pager: [phone] 01/11/2016 4:46 PM  If 7PM-7AM, please contact night-coverage at [email or web address], password [facility]

## 2016-01-11 NOTE — Progress Notes (Signed)
Entered room at this time and pt states "I just don't feel right." Pt unable to explain how he feels. Appears drowsy, spacy. Placed pt on 2L O2 for comfort. After a few minutes pt able to state he felt like he was "falling backwards" although he was sitting up straight in the bed on his back. FSBS 156. Pt states he normally is much higher than that. Pt received 1 cup of juice. States feels better. VSS.

## 2016-01-11 NOTE — Progress Notes (Signed)
Pharmacy Antibiotic Note  Thomas Wright is a 70 y.o. male admitted on 01/09/2016 with diabetic foot infection.  Pharmacy has been consulted for vancomycin and cefepime dosing. SCr trending up to 2.16 today, will adjust abx for renal function  Plan: Adjusted Vancomycin to 1250mg  IV every 24 hours.  Goal trough 15-20 mcg/mL.  Adjusted Cefepime to 1g IV every 12 hours. Monitor clinical progress, c/s, renal function, abx plan/LOT VT@SS  as indicated  Height: 6' (182.9 cm) Weight: 223 lb 4.8 oz (101.288 kg) IBW/kg (Calculated) : 77.6  Temp (24hrs), Avg:98.3 F (36.8 C), Min:98.2 F (36.8 C), Max:98.4 F (36.9 C)   Recent Labs Lab 01/09/16 1642 01/09/16 1728 01/09/16 1955 01/09/16 2133 01/10/16 0436 01/11/16 0420  WBC 28.3*  --   --   --  28.9* 27.5*  CREATININE 1.14  --   --   --  1.45* 2.16*  LATICACIDVEN  --  3.58* 1.9 1.2  --   --     Estimated Creatinine Clearance: 40.3 mL/min (by C-G formula based on Cr of 2.16).    Allergies  Allergen Reactions  . Amoxicill-Clarithro-Omeprazole Diarrhea and Nausea And Vomiting    4/24 urine cx>>insignificant growth 4/24 BCx2>> ng x 1d   Thank you for allowing [place] to participate in this patients care. Jens Som, PharmD Pager: [phone] 01/11/2016 2:01 PM

## 2016-01-11 NOTE — Progress Notes (Signed)
ANTICOAGULATION CONSULT NOTE - Follow Up Consult  Pharmacy Consult for Heparin  Indication: atrial fibrillation  Allergies  Allergen Reactions  . Amoxicill-Clarithro-Omeprazole Diarrhea and Nausea And Vomiting    Patient Measurements: Height: 6' (182.9 cm) Weight: 223 lb 4.8 oz (101.288 kg) IBW/kg (Calculated) : 77.6  Vital Signs:    Labs:  Recent Labs  01/09/16 1642 01/09/16 1955 01/10/16 0436  01/10/16 1226 01/10/16 2024 01/11/16 0420 01/11/16 1420  HGB 15.0  --  12.9*  --   --   --  12.1*  --   HCT 43.9  --  38.7*  --   --   --  36.9*  --   PLT 396  --  324  --   --   --  316  --   APTT  --  29  --   --  44*  --   --   --   LABPROT  --  16.9*  --   --   --   --   --   --   INR  --  1.36  --   --   --   --   --   --   HEPARINUNFRC  --   --   --   < > <0.10* <0.10* 0.15* 0.30  CREATININE 1.14  --  1.45*  --   --   --  2.16*  --   CKTOTAL  --   --   --   --   --   --  61  --   < > = values in this interval not displayed.  Estimated Creatinine Clearance: 40.3 mL/min (by C-G formula based on Cr of 2.16).   Assessment: On heparin while oral anti-coagulation on hold, heparin level is just therapeutic at 0.3, verified with RN that heparin is running at 2100 units/hr with no problems  Goal of Therapy:  Heparin level 0.3-0.7 units/ml Monitor platelets by anticoagulation protocol: Yes   Plan:  -Continue heparin drip at 2100 units/hr -Check anti-Xa level in 6 hours to confirm and daily while on heparin -Continue to monitor H&H and platelets   Thank you for allowing [place] to participate in this patients care. Jens Som, PharmD Pager: [phone]  01/11/2016,3:51 PM

## 2016-01-11 NOTE — Consult Note (Signed)
   [facility] CM Inpatient Consult   01/11/2016  Thomas Wright [birth year] [id number]   Spoke with Thomas Wright at bedside. Chart reviewed. Went to bedside to inquire about affordability of his medications. Thomas Wright endorses that he has had difficulty affording his Lantus, Eliquis, and "another injection", he can not remember. Discussed whether he had mentioned this to Thomas Wright team. Thomas Wright states " I do not remember". Please see chart review tab then notes for Thomas Wright team's patient outreach correspondence with Thomas Wright. Patient has also had a [facility] Pharmacist consult on 11/24/15 noted as well. Thomas Wright endorses he is scheduled for surgery on Friday. Unsure of discharge plan at this point. Made him aware that Thomas Wright will continue to follow. Written consent signed and packet was left at bedside. He has been active with Thomas Wright. He has been followed by Thomas Wright. Made him aware that he will be assigned to Thomas Wright post discharge for post hospital discharge calls and monthly home visits. Also made him aware that Thomas Wright referral will be made as well for follow up regarding medication affordability. Will await to make referrals to Thomas Wright team once discharge plans are more clear. Will continue to follow. Inpatient RNCM already aware that [place] [facility] is following.    Thomas Rolling, MSN-Ed, RN,BSN [facility] Liaison [phone]

## 2016-01-11 NOTE — Progress Notes (Signed)
ANTICOAGULATION CONSULT NOTE - Follow Up Consult  Pharmacy Consult for Heparin  Indication: atrial fibrillation  Allergies  Allergen Reactions  . Amoxicill-Clarithro-Omeprazole Diarrhea and Nausea And Vomiting    Patient Measurements: Height: 6' (182.9 cm) Weight: 223 lb 4.8 oz (101.288 kg) IBW/kg (Calculated) : 77.6  Vital Signs: Temp: 98.2 F (36.8 C) (04/26 0351) Temp Source: Oral (04/26 0351) BP: 139/67 mmHg (04/26 0351) Pulse Rate: 69 (04/26 0351)  Labs:  Recent Labs  01/09/16 1642 01/09/16 1955 01/10/16 0436 01/10/16 1226 01/10/16 2024 01/11/16 0420  HGB 15.0  --  12.9*  --   --  12.1*  HCT 43.9  --  38.7*  --   --  36.9*  PLT 396  --  324  --   --  316  APTT  --  29  --  44*  --   --   LABPROT  --  16.9*  --   --   --   --   INR  --  1.36  --   --   --   --   HEPARINUNFRC  --   --   --  <0.10* <0.10* 0.15*  CREATININE 1.14  --  1.45*  --   --  2.16*    Estimated Creatinine Clearance: 40.3 mL/min (by C-G formula based on Cr of 2.16).   Assessment: On heparin while oral anti-coagulation on hold, heparin level is low at 0.15, verified with RN that heparin is running at 1800 units/hr  Goal of Therapy:  Heparin level 0.3-0.7 units/ml Monitor platelets by anticoagulation protocol: Yes   Plan:  -Increase heparin drip to 2100 units/hr -1330 HL  Ardice Boyan 01/11/2016,5:37 AM

## 2016-01-11 NOTE — Progress Notes (Signed)
Inpatient Diabetes Program Recommendations  AACE/ADA: New Consensus Statement on Inpatient Glycemic Control (2015)  Target Ranges:  Prepandial:   less than 140 mg/dL      Peak postprandial:   less than 180 mg/dL (1-2 hours)      Critically ill patients:  140 - 180 mg/dL   Review of Glycemic Control  Spoke with patient about diabetes and home regimen for diabetes control. Patient reports that he is followed by Dr. Dwyane Dee (Endocrinologist) for diabetes management. Last visit Oct 2016, A1c 11% at that time. Patient reports running out of Lantus and didn't get if filled back in January/February. Patient has not had insulin in 2-3 months. Patient reports recently living with friend who did not have healthy eating options and has now recently moved by himself. Patient reports needing a new meter. Discussed A1C results (12.6% on 01/09/2016). Discussed glucose and A1C goals. Discussed importance of checking CBGs and maintaining good CBG control to prevent long-term and short-term complications. Explained how hyperglycemia leads to damage within blood vessels which lead to the common complications seen with uncontrolled diabetes. Discussed impact of nutrition, exercise, stress, sickness, and medications on diabetes control.Discussed carbohydrates, carbohydrate goals per day and meal, along with portion sizes. Spoke to patient about following up with Dr. Dwyane Dee for glucose control. Patient verbalized understanding of information discussed and has no further questions at this time related to diabetes.  Per med rec from Dr. Dwyane Dee patient was taking Lantus 30 units and Novolog 6 units with breakfast, 6 units with lunch and 8 units before supper, Metformin 1,000 mg BID  Patient has Lantus to pick up at time of discharge  Patient DM Plan for d/c: Pt needs Rx for short acting insulin (vial) Novolog is covered by insurance insulin pen needles (order # [phone]) for Lantus  Syringes for short acting insulin (order #  [id number]) Glucose meter kit (order # [id number])   Thanks, Tama Headings RN, MSN, [place] Diabetes Coordinator Team Pager [phone] (8a-5p)

## 2016-01-12 ENCOUNTER — Other Ambulatory Visit: Payer: Self-pay | Admitting: Orthopedic Surgery

## 2016-01-12 LAB — PROTEIN / CREATININE RATIO, URINE
CREATININE, URINE: 84.33 mg/dL
PROTEIN CREATININE RATIO: 0.38 mg/mg{creat} — AB (ref 0.00–0.15)
TOTAL PROTEIN, URINE: 32 mg/dL

## 2016-01-12 LAB — URINE MICROSCOPIC-ADD ON

## 2016-01-12 LAB — URINALYSIS, ROUTINE W REFLEX MICROSCOPIC
Bilirubin Urine: NEGATIVE
Glucose, UA: >1000 mg/dL — AB
Hgb urine dipstick: NEGATIVE
Ketones, ur: NEGATIVE mg/dL
LEUKOCYTES UA: NEGATIVE
Nitrite: NEGATIVE
PROTEIN: NEGATIVE mg/dL
SPECIFIC GRAVITY, URINE: 1.018 (ref 1.005–1.030)
pH: 5.5 (ref 5.0–8.0)

## 2016-01-12 LAB — PROTIME-INR
INR: 1.63 — AB (ref 0.00–1.49)
PROTHROMBIN TIME: 19.3 s — AB (ref 11.6–15.2)

## 2016-01-12 LAB — HEPARIN LEVEL (UNFRACTIONATED)
Heparin Unfractionated: 0.4 IU/mL (ref 0.30–0.70)
Heparin Unfractionated: 0.3 IU/mL (ref 0.30–0.70)

## 2016-01-12 LAB — COMPREHENSIVE METABOLIC PANEL
ALBUMIN: 1.7 g/dL — AB (ref 3.5–5.0)
ALT: 7 U/L — AB (ref 17–63)
ANION GAP: 9 (ref 5–15)
AST: 13 U/L — AB (ref 15–41)
Alkaline Phosphatase: 91 U/L (ref 38–126)
BILIRUBIN TOTAL: 0.4 mg/dL (ref 0.3–1.2)
BUN: 32 mg/dL — ABNORMAL HIGH (ref 6–20)
CO2: 22 mmol/L (ref 22–32)
Calcium: 7.9 mg/dL — ABNORMAL LOW (ref 8.9–10.3)
Chloride: 103 mmol/L (ref 101–111)
Creatinine, Ser: 2.34 mg/dL — ABNORMAL HIGH (ref 0.61–1.24)
GFR calc Af Amer: 31 mL/min — ABNORMAL LOW (ref >60)
GFR calc non Af Amer: 27 mL/min — ABNORMAL LOW (ref >60)
GLUCOSE: 168 mg/dL — AB (ref 65–99)
POTASSIUM: 3.7 mmol/L (ref 3.5–5.1)
Sodium: 134 mmol/L — ABNORMAL LOW (ref 135–145)
TOTAL PROTEIN: 6 g/dL — AB (ref 6.5–8.1)

## 2016-01-12 LAB — CBC WITH DIFFERENTIAL/PLATELET
BASOS ABS: 0 10*3/uL (ref 0.0–0.1)
Basophils Relative: 0 %
Eosinophils Absolute: 0.3 10*3/uL (ref 0.0–0.7)
Eosinophils Relative: 1 %
HCT: 35.5 % — ABNORMAL LOW (ref 39.0–52.0)
HEMOGLOBIN: 11.9 g/dL — AB (ref 13.0–17.0)
LYMPHS PCT: 8 %
Lymphs Abs: 2.1 10*3/uL (ref 0.7–4.0)
MCH: 27.5 pg (ref 26.0–34.0)
MCHC: 33.5 g/dL (ref 30.0–36.0)
MCV: 82.2 fL (ref 78.0–100.0)
MONOS PCT: 7 %
Monocytes Absolute: 1.8 10*3/uL — ABNORMAL HIGH (ref 0.1–1.0)
NEUTROS ABS: 21.7 10*3/uL — AB (ref 1.7–7.7)
NEUTROS PCT: 84 %
Platelets: 289 10*3/uL (ref 150–400)
RBC: 4.32 MIL/uL (ref 4.22–5.81)
RDW: 13.8 % (ref 11.5–15.5)
WBC: 25.9 10*3/uL — AB (ref 4.0–10.5)

## 2016-01-12 LAB — CREATININE, URINE, RANDOM: Creatinine, Urine: 83.79 mg/dL

## 2016-01-12 LAB — SURGICAL PCR SCREEN
MRSA, PCR: NEGATIVE
STAPHYLOCOCCUS AUREUS: POSITIVE — AB

## 2016-01-12 LAB — SODIUM, URINE, RANDOM: SODIUM UR: 15 mmol/L

## 2016-01-12 LAB — GLUCOSE, CAPILLARY
GLUCOSE-CAPILLARY: 170 mg/dL — AB (ref 65–99)
Glucose-Capillary: 159 mg/dL — ABNORMAL HIGH (ref 65–99)
Glucose-Capillary: 199 mg/dL — ABNORMAL HIGH (ref 65–99)
Glucose-Capillary: 266 mg/dL — ABNORMAL HIGH (ref 65–99)
Glucose-Capillary: 308 mg/dL — ABNORMAL HIGH (ref 65–99)

## 2016-01-12 LAB — MAGNESIUM: Magnesium: 1.7 mg/dL (ref 1.7–2.4)

## 2016-01-12 MED ORDER — HEPARIN (PORCINE) IN NACL 100-0.45 UNIT/ML-% IJ SOLN
2150.0000 [IU]/h | INTRAMUSCULAR | Status: DC
  Administered 2016-01-12 – 2016-01-13 (×2): 2150 [IU]/h via INTRAVENOUS
  Filled 2016-01-12 (×4): qty 250

## 2016-01-12 MED ORDER — DEXTROSE 5 % IV SOLN
1.0000 g | Freq: Once | INTRAVENOUS | Status: AC
  Administered 2016-01-12: 1 g via INTRAVENOUS
  Filled 2016-01-12: qty 1

## 2016-01-12 MED ORDER — DEXTROSE 5 % IV SOLN
2.0000 g | INTRAVENOUS | Status: DC
  Administered 2016-01-13 – 2016-01-14 (×2): 2 g via INTRAVENOUS
  Filled 2016-01-12 (×2): qty 2

## 2016-01-12 NOTE — Progress Notes (Signed)
[facility] Progress Note  Patient: SUHAYB ANZALONE IEP:[id number]   PCP: [name], MD DOB: 1946-04-13   DOA: 01/09/2016   DOS: 01/12/2016   Date of Service: the patient was seen and examined on 01/12/2016 Outpatient Specialists: Tana Conch cardiology Pulmonary Praveen mannam  Subjective: Patient mentions he is urinating every 2 hours, denies having any burning. Denies any nausea vomiting or diarrhea. No abdominal pain. Feels somewhat better this morning. Nutrition: Tolerating oral diet  Brief hospital course: Patient was admitted on 01/09/2016, with complaint of fever chills and right foot discoloration, was found to have right foot gangrene with sepsis. Was started on broad-spectrum antibiotics, orthopedic was consulted and recommended transtibial amputation which is scheduled on 01/13/2016. Patient has chronic right plantar ulcer and has been following up with the wound care center but he stopped going there since last 2 weeks prior to admission. Patient also has mild worsening of his chronic kidney disease Currently further plan is continue IV hydration and monitor renal function and continue IV antibiotics.  Assessment and Plan: 1. Diabetic foot infection ([place]) Met sepsis criteria on admission with fever, leukocytosis. Currently on vancomycin and cefepime and Flagyl. Follow blood cultures. Orthopedic has been consulted and the patient is scheduled for transfer to be an amputation on 01/13/2016. ABI shows within normal limits. Persistent leukocytosis but improving. With worsening renal function I would discontinue vancomycin. Add clindamycin  2. Acute kidney injury. Most likely in the setting of sepsis. CK normal. Continue IV hydration. Monitor renal function. Discontinue Lasix. Appreciate input from nephrology  3. Chronic combined CHF. Recent echocardiogram in August 2016 shows normal ejection fraction 50-55% with grade 1 diastolic dysfunction. Continue aspirin  and statin and beta blocker. Monitor for volume overload  4.Atrial fibrillation CHA2DS2-VASc Score 4 We'll continue amiodarone and Coreg as well as aspirin  On Eliquis which is on hold for surgery Continue heparin infusion therapeutic dose  5. Uncontrolled insulin-dependent type 2 diabetes mellitus. Hemoglobin A1c 12.6 on April 24. Blood sugar mildly elevated. Pt without insulin for last 2-3 months prior to admission. We will increase Lantus to 40 units  Activity: physical therapy consulted Bowel regimen: last BM 01/12/2016 Diet: Cardiac and diabetic diet DVT Prophylaxis: on therapeutic anticoagulation.  Advance goals of care discussion: Full code  Family Communication: family was present at bedside, at the time of interview. The pt provided permission to discuss medical plan with the family. Opportunity was given to ask question and all questions were answered satisfactorily.    Disposition:  Expected discharge date: 01/16/2016 Barriers to safe discharge: Postoperative course  Consultants: Orthopedics, nephrology Procedures: None  Antibiotics: Anti-infectives    Start     Dose/Rate Route Frequency Ordered Stop   01/13/16 1500  ceFEPIme (MAXIPIME) 2 g in dextrose 5 % 50 mL IVPB     2 g 100 mL/hr over 30 Minutes Intravenous Every 24 hours 01/12/16 1345     01/12/16 1400  ceFEPIme (MAXIPIME) 1 g in dextrose 5 % 50 mL IVPB     1 g 100 mL/hr over 30 Minutes Intravenous  Once 01/12/16 1345 01/12/16 1516   01/12/16 0500  vancomycin (VANCOCIN) 1,250 mg in sodium chloride 0.9 % 250 mL IVPB  Status:  Discontinued     1,250 mg 166.7 mL/hr over 90 Minutes Intravenous Every 24 hours 01/11/16 1353 01/12/16 0746   01/11/16 1800  ceFEPIme (MAXIPIME) 1 g in dextrose 5 % 50 mL IVPB  Status:  Discontinued     1 g 100 mL/hr over  30 Minutes Intravenous Every 12 hours 01/11/16 1354 01/12/16 1345   01/10/16 0815  metroNIDAZOLE (FLAGYL) IVPB 500 mg     500 mg 100 mL/hr over 60 Minutes  Intravenous Every 8 hours 01/10/16 0731     01/10/16 0800  vancomycin (VANCOCIN) IVPB 750 mg/150 ml premix  Status:  Discontinued     750 mg 150 mL/hr over 60 Minutes Intravenous Every 12 hours 01/10/16 0748 01/11/16 1353   01/10/16 0800  ceFEPIme (MAXIPIME) 1 g in dextrose 5 % 50 mL IVPB  Status:  Discontinued     1 g 100 mL/hr over 30 Minutes Intravenous Every 8 hours 01/10/16 0748 01/11/16 1354   01/09/16 1930  cefTRIAXone (ROCEPHIN) 2 g in dextrose 5 % 50 mL IVPB  Status:  Discontinued     2 g 100 mL/hr over 30 Minutes Intravenous Every 24 hours 01/09/16 1924 01/10/16 0732   01/09/16 1930  metroNIDAZOLE (FLAGYL) IVPB 500 mg  Status:  Discontinued     500 mg 100 mL/hr over 60 Minutes Intravenous Every 8 hours 01/09/16 1924 01/10/16 0732   01/09/16 1715  piperacillin-tazobactam (ZOSYN) IVPB 3.375 g     3.375 g 100 mL/hr over 30 Minutes Intravenous  Once 01/09/16 1710 01/09/16 1843   01/09/16 1715  vancomycin (VANCOCIN) IVPB 1000 mg/200 mL premix     1,000 mg 200 mL/hr over 60 Minutes Intravenous  Once 01/09/16 1710 01/09/16 1946        Intake/Output Summary (Last 24 hours) at 01/12/16 1739 Last data filed at 01/12/16 1437  Gross per 24 hour  Intake 1513.67 ml  Output   1005 ml  Net 508.67 ml   Filed Weights   01/09/16 1641 01/11/16 0351 01/12/16 0412  Weight: 106.595 kg (235 lb) 101.288 kg (223 lb 4.8 oz) 102.967 kg (227 lb)    Objective: Physical Exam: Filed Vitals:   01/11/16 1500 01/11/16 2010 01/12/16 0412 01/12/16 1438  BP: 128/68 130/72 135/70 117/53  Pulse: 72 68 72 66  Temp: 98.1 F (36.7 C) 98.1 F (36.7 C) 98 F (36.7 C) 98.6 F (37 C)  TempSrc: Oral Oral Oral Oral  Resp: 18   18  Height:      Weight:   102.967 kg (227 lb)   SpO2: 97% 99% 99% 99%     General: Appear in mild distress, no Rash; Oral Mucosa moist. Cardiovascular: S1 and S2 Present, no Murmur, no JVD Respiratory: Bilateral Air entry present and Clear to Auscultation, no Crackles, no  wheezes Abdomen: Bowel Sound present, Soft and no tenderness Extremities: bilateral Pedal edema, no calf tenderness Neurology: Grossly no focal neuro deficit.  Data Reviewed: CBC:  Recent Labs Lab 01/09/16 1642 01/10/16 0436 01/11/16 0420 01/12/16 0047  WBC 28.3* 28.9* 27.5* 25.9*  NEUTROABS 25.2* 25.2*  --  21.7*  HGB 15.0 12.9* 12.1* 11.9*  HCT 43.9 38.7* 36.9* 35.5*  MCV 83.5 82.2 84.1 82.2  PLT 396 324 316 329   Basic Metabolic Panel:  Recent Labs Lab 01/09/16 1642 01/10/16 0436 01/11/16 0420 01/12/16 0047  NA 135 132* 132* 134*  K 4.2 3.5 5.0 3.7  CL 92* 97* 100* 103  CO2 23 22 24 22   GLUCOSE 328* 352* 193* 168*  BUN 17 22* 30* 32*  CREATININE 1.14 1.45* 2.16* 2.34*  CALCIUM 9.4 8.1* 8.1* 7.9*  MG  --   --   --  1.7   GFR: Estimated Creatinine Clearance: 37.5 mL/min (by C-G formula based on Cr of 2.34). Liver Function  Tests:  Recent Labs Lab 01/09/16 1642 01/12/16 0047  AST 16 13*  ALT 11* 7*  ALKPHOS 142* 91  BILITOT 1.4* 0.4  PROT 8.6* 6.0*  ALBUMIN 2.6* 1.7*   No results for input(s): LIPASE, AMYLASE in the last 168 hours. No results for input(s): AMMONIA in the last 168 hours. Coagulation Profile:  Recent Labs Lab 01/09/16 1955 01/12/16 0047  INR 1.36 1.63*   Cardiac Enzymes:  Recent Labs Lab 01/11/16 0420  CKTOTAL 61   BNP (last 3 results) No results for input(s): PROBNP in the last 8760 hours. HbA1C:  Recent Labs  01/09/16 2120  HGBA1C 12.6*   CBG:  Recent Labs Lab 01/11/16 2005 01/12/16 0024 01/12/16 0634 01/12/16 1142 01/12/16 1710  GLUCAP 156* 159* 170* 199* 266*   Lipid Profile: No results for input(s): CHOL, HDL, LDLCALC, TRIG, CHOLHDL, LDLDIRECT in the last 72 hours. Thyroid Function Tests: No results for input(s): TSH, T4TOTAL, FREET4, T3FREE, THYROIDAB in the last 72 hours. Anemia Panel: No results for input(s): VITAMINB12, FOLATE, FERRITIN, TIBC, IRON, RETICCTPCT in the last 72 hours. Urine  analysis:    Component Value Date/Time   COLORURINE YELLOW 01/10/2016 0845   APPEARANCEUR CLOUDY* 01/10/2016 0845   LABSPEC 1.023 01/10/2016 0845   PHURINE 5.0 01/10/2016 0845   GLUCOSEU >1000* 01/10/2016 0845   HGBUR TRACE* 01/10/2016 0845   BILIRUBINUR NEGATIVE 01/10/2016 0845   KETONESUR NEGATIVE 01/10/2016 0845   PROTEINUR NEGATIVE 01/10/2016 0845   UROBILINOGEN 0.2 07/28/2014 1739   NITRITE NEGATIVE 01/10/2016 0845   LEUKOCYTESUR NEGATIVE 01/10/2016 0845   Sepsis Labs: @LABRCNTIP (procalcitonin:4,lacticidven:4)  ) Recent Results (from the past 240 hour(s))  Culture, blood (Routine x 2)     Status: None (Preliminary result)   Collection Time: 01/09/16  4:42 PM  Result Value Ref Range Status   Specimen Description BLOOD RIGHT ANTECUBITAL  Final   Special Requests BOTTLES DRAWN AEROBIC AND ANAEROBIC 5CC  Final   Culture NO GROWTH 3 DAYS  Final   Report Status PENDING  Incomplete  Culture, blood (Routine x 2)     Status: None (Preliminary result)   Collection Time: 01/09/16  8:15 PM  Result Value Ref Range Status   Specimen Description BLOOD RIGHT HAND  Final   Special Requests BOTTLES DRAWN AEROBIC AND ANAEROBIC 5CC  Final   Culture NO GROWTH 3 DAYS  Final   Report Status PENDING  Incomplete  Urine culture     Status: Abnormal   Collection Time: 01/10/16  8:44 AM  Result Value Ref Range Status   Specimen Description URINE, CLEAN CATCH  Final   Special Requests NONE  Final   Culture 2,000 COLONIES/mL INSIGNIFICANT GROWTH (A)  Final   Report Status 01/11/2016 FINAL  Final      Studies: No results found.   Scheduled Meds: . amiodarone  200 mg Oral Daily  . atorvastatin  10 mg Oral q morning - 10a  . carvedilol  6.25 mg Oral BID WC  . [START ON 01/13/2016] ceFEPime (MAXIPIME) IV  2 g Intravenous Q24H  . feeding supplement (PRO-STAT SUGAR FREE 64)  30 mL Oral BID  . insulin aspart  0-15 Units Subcutaneous TID WC  . insulin aspart  4 Units Subcutaneous TID WC  .  insulin glargine  30 Units Subcutaneous QHS  . metronidazole  500 mg Intravenous Q8H  . multivitamin with minerals  1 tablet Oral Daily   Continuous Infusions: . sodium chloride 100 mL/hr at 01/11/16 1647  . heparin 2,150 Units/hr (01/12/16  1446)   PRN Meds: acetaminophen **OR** acetaminophen, HYDROcodone-acetaminophen, morphine injection, ondansetron **OR** ondansetron (ZOFRAN) IV, polyethylene glycol, sodium chloride flush  Time spent: 30 minutes  Author: Berle Mull, MD [facility] Pager: [phone] 01/12/2016 5:39 PM  If 7PM-7AM, please contact night-coverage at [email or web address], password [facility]

## 2016-01-12 NOTE — Progress Notes (Signed)
ANTICOAGULATION CONSULT NOTE - Follow Up Consult  Pharmacy Consult for heparin Indication: atrial fibrillation   Labs:  Recent Labs  01/09/16 1642 01/09/16 1955 01/10/16 0436 01/10/16 1226  01/11/16 0420 01/11/16 1420 01/12/16 0047  HGB 15.0  --  12.9*  --   --  12.1*  --  11.9*  HCT 43.9  --  38.7*  --   --  36.9*  --  35.5*  PLT 396  --  324  --   --  316  --  289  APTT  --  29  --  44*  --   --   --   --   LABPROT  --  16.9*  --   --   --   --   --  19.3*  INR  --  1.36  --   --   --   --   --  1.63*  HEPARINUNFRC  --   --   --  <0.10*  < > 0.15* 0.30 0.40  CREATININE 1.14  --  1.45*  --   --  2.16*  --   --   CKTOTAL  --   --   --   --   --  61  --   --   < > = values in this interval not displayed.    Assessment/Plan:  70yo male remains therapeutic on heparin. Will continue gtt at current rate and continue to monitor.   Wynona Neat, PharmD, BCPS  01/12/2016,1:35 AM

## 2016-01-12 NOTE — Progress Notes (Signed)
Pt has been on 2L O2 since episode earlier in shift, for comfort purposes. Pt called RN back into room stating, "I wonder if I have apnea because I am waking up gasping for air." Pt stating he doesn't want to go back to sleep because he is scared of this happening. This RN paged Dr at this time requesting sleep apnea study per pt request. Placed patient on continuous pulse ox monitoring at this time and leaving on 2L O2 for comfort. Informed patient about the use of continuous pulse ox monitoring and the alert factor for apnea or decreased oxygenation.

## 2016-01-12 NOTE — Progress Notes (Signed)
Rechecked FSBS just to monitor. FS 159. Pt eating graham crackers snack per request. States feels better than earlier.

## 2016-01-12 NOTE — Consult Note (Signed)
Reason for Consult: Acute renal failure Referring Physician: Berle Mull M.D. [facility])    HPI:  70 year old man with history of poorly controlled insulin-dependent diabetes (hemoglobin A1c >11%) without reported retinopathy or proteinuria, aortic regurgitation status post AVR, hypertension, paroxysmal atrial fibrillation on Eliquis and congestive heart failure who was admitted to the hospital with complaints of fever/chills and right foot discoloration. He was found to have right foot gangrene with sepsis associated with chronic right plantar ulcer. Plans are in place for right below-knee amputation by Dr. Sharol Given on 01/13/16. His baseline creatinine appears to range 0.9-1.1. Concern is raised with rising creatinine which this morning is up to 2.34. He yesterday had mild hyperkalemia of 5.0.  He denies any prior history of acute renal failure, denies history of recurrent nephrolithiasis, history of urinary tract infections, hematuria or foamy urine. He denies any obstructive or irritative urinary symptoms and states that (contrary to what is documented in his I's/O's) he is making a lot of urine. He did have some transient nausea/vomiting with decreased oral intake prior to hospitalization and was noted to have transient hypotension (92/61) on 4/24. He has not had any iodinated intravenous contrast exposure and reports sporadic use of NSAIDs-400-800 mg ibuprofen twice a week prior to presentation. He currently denies any chest pain or shortness of breath. He denies any strong family history of renal disease. Denies any skin rash/URI symptoms/hemoptysis. Did have a dry cough prior to presentation-no recent antibiotics.    04/22/2015   05/24/2015   01/09/2016   01/10/2016   01/11/2016   01/12/2016  BUN 29 (H) 19 17 22  (H) 30 (H) 32 (H)  Creatinine 0.94 1.07 1.14 1.45 (H) 2.16 (H) 2.34 (H)   Renal ultrasound done earlier today shows right kidney 14.6 cm with 1.7 cm lower pole cyst, left kidney 13.2 cm, normal  echogenicity and no hydronephrosis.  Past Medical History  Diagnosis Date  . Hypertension   . PAF (paroxysmal atrial fibrillation) ([place])     a. Postop 2008. b. recurrent afib 08/2014 with LAA clot noted on TEE. EF was down again at 25%. He was anticoagulated with Eliquis and placed on Amiodarone.He ultimately underwent TEE-DCCV 08/2014.  Marland Kitchen Hypertriglyceridemia   . Cardiomyopathy ([place])     EF originally 20%-now 45%, no CAD on cath  . Chronic combined systolic and diastolic CHF (congestive heart failure) ([place])     a. 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 Reported h/o cath in 2008 without significant CAD.  Marland Kitchen Type II diabetes mellitus ([place])   . Arthritis     .  Marland Kitchen Depression     "a little right now" (04/19/2015)  . S/P aortic valve replacement with bioprosthetic valve     a. bioprosthetic AVR in 05/2007 in [place].  . Hyperlipidemia     Past Surgical History  Procedure Laterality Date  . Aortic valve replacement  05/2007    with tissue graft  . I&d extremity Right 05/05/2014    Procedure: IRRIGATION AND DEBRIDEMENT EXTREMITY;  Surgeon: [name], MD;  Location: [place];  Service: Orthopedics;  Laterality: Right;  . Tee without cardioversion N/A 07/23/2014    Procedure: TRANSESOPHAGEAL ECHOCARDIOGRAM (TEE);  Surgeon: Josue Hector, MD;  Location: [facility] ENDOSCOPY;  Service: Cardiovascular;  Laterality: N/A;  . Cardioversion N/A 07/23/2014    Procedure: CARDIOVERSION;  Surgeon: Josue Hector, MD;  Location[facility] ENDOSCOPY;  Service: Cardiovascular;  Laterality: N/A;  .  Tee without cardioversion N/A 09/01/2014    Procedure: TRANSESOPHAGEAL ECHOCARDIOGRAM (TEE);  Surgeon: [name], MD;  Location: [facility] ENDOSCOPY;  Service: Cardiovascular;  Laterality: N/A;  . Cardioversion N/A 09/01/2014    Procedure: CARDIOVERSION;  Surgeon: [name], MD;  Location[facility] ENDOSCOPY;  Service: Cardiovascular;  Laterality: N/A;  .  Amputation Right 07/29/2014    Procedure: AMPUTATION RIGHT LONG FINGER;  Surgeon: [name], MD;  Location: [place];  Service: Orthopedics;  Laterality: Right;  . Cardiac valve replacement    . Tonsillectomy  1954  . Cardiac catheterization  "several"    Family History  Problem Relation Age of Onset  . Colon cancer Father   . Diabetes Father   . Liver disease Brother   . Heart murmur Child   . Congenital heart disease Other   . Diabetes Maternal Grandmother   . Heart disease Maternal Grandfather     A Fib    Social History:  reports that he has never smoked. He has never used smokeless tobacco. He reports that he drinks alcohol. He reports that he does not use illicit drugs.  Allergies:  Allergies  Allergen Reactions  . Amoxicill-Clarithro-Omeprazole Diarrhea and Nausea And Vomiting    Medications:  Scheduled: . amiodarone  200 mg Oral Daily  . atorvastatin  10 mg Oral q morning - 10a  . carvedilol  6.25 mg Oral BID WC  . ceFEPime (MAXIPIME) IV  1 g Intravenous Q12H  . feeding supplement (PRO-STAT SUGAR FREE 64)  30 mL Oral BID  . insulin aspart  0-15 Units Subcutaneous TID WC  . insulin aspart  4 Units Subcutaneous TID WC  . insulin glargine  30 Units Subcutaneous QHS  . metronidazole  500 mg Intravenous Q8H  . multivitamin with minerals  1 tablet Oral Daily    BMP Latest Ref Rng 01/12/2016 01/11/2016 01/10/2016  Glucose 65 - 99 mg/dL 168(H) 193(H) 352(H)  BUN 6 - 20 mg/dL 32(H) 30(H) 22(H)  Creatinine 0.61 - 1.24 mg/dL 2.34(H) 2.16(H) 1.45(H)  Sodium 135 - 145 mmol/L 134(L) 132(L) 132(L)  Potassium 3.5 - 5.1 mmol/L 3.7 5.0 3.5  Chloride 101 - 111 mmol/L 103 100(L) 97(L)  CO2 22 - 32 mmol/L 22 24 22   Calcium 8.9 - 10.3 mg/dL 7.9(L) 8.1(L) 8.1(L)    CBC Latest Ref Rng 01/12/2016 01/11/2016 01/10/2016  WBC 4.0 - 10.5 K/uL 25.9(H) 27.5(H) 28.9(H)  Hemoglobin 13.0 - 17.0 g/dL 11.9(L) 12.1(L) 12.9(L)  Hematocrit 39.0 - 52.0 % 35.5(L) 36.9(L) 38.7(L)  Platelets 150 -  400 K/uL 289 316 324      US Renal  01/11/2016  CLINICAL DATA:  Acute renal injury EXAM: RENAL / URINARY TRACT ULTRASOUND COMPLETE COMPARISON:  None. FINDINGS: Right Kidney: Length: 14.6 cm.  1.7 cm cyst is noted in the lower pole. Left Kidney: Length: 13.2 cm. Echogenicity within normal limits. No mass or hydronephrosis visualized. Bladder: Appears normal for degree of bladder distention. IMPRESSION: Right renal cyst.  No acute abnormality is noted. Electronically Signed   By: Inez Catalina M.D.   On: 01/11/2016 10:38    Review of Systems  Constitutional: Positive for fever, chills and malaise/fatigue.       Prior to presentation  HENT: Negative.   Eyes: Negative.   Respiratory: Negative.   Cardiovascular: Negative.   Gastrointestinal: Positive for nausea and vomiting.  Genitourinary: Negative.   Musculoskeletal: Negative.   Skin: Negative.   Neurological:       Chronic lower extremity neuropathy/numbness  All other systems  reviewed and are negative.  Blood pressure 135/70, pulse 72, temperature 98 F (36.7 C), temperature source Oral, resp. rate 18, height 6' (1.829 m), weight 102.967 kg (227 lb), SpO2 99 %. Physical Exam  Nursing note and vitals reviewed. Constitutional: He is oriented to person, place, and time. He appears well-developed and well-nourished. No distress.  HENT:  Head: Normocephalic and atraumatic.  Nose: Nose normal.  Mouth/Throat: Oropharynx is clear and moist.  Eyes: EOM are normal. Pupils are equal, round, and reactive to light. No scleral icterus.  Neck: Normal range of motion. Neck supple. No JVD present.  Cardiovascular: Normal rate, regular rhythm and normal heart sounds.   Respiratory: Effort normal and breath sounds normal. He has no wheezes. He has no rales.  GI: Soft. Bowel sounds are normal. He exhibits no distension. There is no tenderness.  Musculoskeletal: He exhibits edema.  Trace to 1+ bilateral lower extremity edema  Neurological: He is  alert and oriented to person, place, and time.  Skin: Skin is warm and dry.  Left ankle/foot in gauze dressing that is stained by serous discharge. Ischemic toes noted.  Psychiatric: He has a normal mood and affect.    Assessment/Plan: 1. Acute renal injury: The history/available data appears to be most consistent with normotensive ATN associated with sepsis with possible transient ischemic injury from relative hypotension. No evidence of obstruction noted on renal ultrasound and no clinically corroborating evidence of acute GN. I will send off repeat urinalysis/urine electrolytes and quantify urine protein to creatinine ratio. He has not had any obvious nephrotoxins exposure and vancomycin level is pending. I discussed with the patient regarding the mechanism of injury and proposed management while answering questions to his satisfaction. I agree with conservative management at this time, empiric intravenous fluids and stopping furosemide while strictly monitoring input/output. He does not have any acute electrolyte abnormalities or uremic signs/symptoms to prompt intervention with dialysis. He is slightly hypervolemic and will be monitored closely for volume overload/need for furosemide given history of CHF. Antibiotics have been renally adjusted by pharmacy. Support blood pressures and hold antihypertensives for hypotensive episodes. 2. Right ischemic foot/diabetic foot infection: With criteria of sepsis on admission and started on broad-spectrum antibiotic therapy-seen by orthopedic surgery with amputation (BKA) scheduled for tomorrow. 3. History of atrial fibrillation/status post aortic valve replacement: Transitioned over from eliquis to intravenous heparin drip to allow for surgical intervention without prolonged bleeding complications. On my exam, appears to be in sinus rhythm at this time. 4. Insulin-dependent diabetes mellitus: Management per hospitalist service  Jaidan Prevette K. 01/12/2016, 1:11  PM

## 2016-01-12 NOTE — Care Management Important Message (Signed)
Important Message  Patient Details  Name: Thomas Wright MRN: [id number] Date of Birth: 1946/05/23   Medicare Important Message Given:  Yes    Thomas Wright 01/12/2016, 3:07 PM

## 2016-01-12 NOTE — Progress Notes (Signed)
ANTICOAGULATION & ANTIBIOTIC CONSULT NOTE - Follow Up Consult  Pharmacy Consult:  Heparin + Vancomycin / Cefepime Indication: atrial fibrillation + sepsis  Allergies  Allergen Reactions  . Amoxicill-Clarithro-Omeprazole Diarrhea and Nausea And Vomiting    Patient Measurements: Height: 6' (182.9 cm) Weight: 227 lb (102.967 kg) IBW/kg (Calculated) : 77.6 Heparin Dosing Weight: 99 kg  Vital Signs: Temp: 98 F (36.7 C) (04/27 0412) Temp Source: Oral (04/27 0412) BP: 135/70 mmHg (04/27 0412) Pulse Rate: 72 (04/27 0412)  Labs:  Recent Labs  01/09/16 1955 01/10/16 0436 01/10/16 1226  01/11/16 0420 01/11/16 1420 01/12/16 0047 01/12/16 0500  HGB  --  12.9*  --   --  12.1*  --  11.9*  --   HCT  --  38.7*  --   --  36.9*  --  35.5*  --   PLT  --  324  --   --  316  --  289  --   APTT 29  --  44*  --   --   --   --   --   LABPROT 16.9*  --   --   --   --   --  19.3*  --   INR 1.36  --   --   --   --   --  1.63*  --   HEPARINUNFRC  --   --  <0.10*  < > 0.15* 0.30 0.40 0.30  CREATININE  --  1.45*  --   --  2.16*  --  2.34*  --   CKTOTAL  --   --   --   --  61  --   --   --   < > = values in this interval not displayed.  Estimated Creatinine Clearance: 37.5 mL/min (by C-G formula based on Cr of 2.34).    Assessment: 13 YOM with history of Afib to continue on IV heparin while Eliquis is on hold.  Heparin level is therapeutic and at the low end of goal.  No bleeding reported.   Pharmacy managing vancomycin and cefepime for sepsis.  Patient is also on Flagyl.  His renal function is declining but he is still making urine.  Patient remains afebrile and his WBC is improving.  Vanc 4/24 >> Cefepime 4/25 >> Flagyl 4/25 >>  4/24 UCx - negative 4/24 BCx2 - NGTD   Goal of Therapy:  Heparin level 0.3-0.7 units/ml Monitor platelets by anticoagulation protocol: Yes  Vanc trough 15-20 mcg/mL    Plan:  - Increase heparin gtt slightly 2150 units/hr - Daily HL / CBC - Hold  vancomycin, check random level in AM per MD's request (not a Css level) - Change cefepime to 2gm IV Q24H - Monitor renal fxn, clinical progress - Consider discontinuing Flagyl or change cefepime/Flagyl to Zosyn    Joandry Slagter D. Mina Marble, PharmD, BCPS Pager:  [phone] 01/12/2016, 1:42 PM

## 2016-01-13 ENCOUNTER — Encounter (HOSPITAL_COMMUNITY)
Admission: EM | Disposition: A | Discharge status: Rehabilitation Facility | Payer: Self-pay | Source: Home / Self Care | Attending: Internal Medicine

## 2016-01-13 ENCOUNTER — Encounter (HOSPITAL_COMMUNITY): Payer: Self-pay | Admitting: Certified Registered"

## 2016-01-13 ENCOUNTER — Inpatient Hospital Stay (HOSPITAL_COMMUNITY): Payer: Commercial Managed Care - HMO | Admitting: Certified Registered"

## 2016-01-13 ENCOUNTER — Other Ambulatory Visit: Payer: Self-pay | Admitting: *Deleted

## 2016-01-13 HISTORY — PX: APPLICATION OF WOUND VAC: SHX5189

## 2016-01-13 HISTORY — PX: AMPUTATION: SHX166

## 2016-01-13 LAB — GLUCOSE, CAPILLARY
GLUCOSE-CAPILLARY: 116 mg/dL — AB (ref 65–99)
Glucose-Capillary: 222 mg/dL — ABNORMAL HIGH (ref 65–99)
Glucose-Capillary: 163 mg/dL — ABNORMAL HIGH (ref 65–99)
Glucose-Capillary: 119 mg/dL — ABNORMAL HIGH (ref 65–99)
Glucose-Capillary: 155 mg/dL — ABNORMAL HIGH (ref 65–99)
Glucose-Capillary: 126 mg/dL — ABNORMAL HIGH (ref 65–99)
Glucose-Capillary: 120 mg/dL — ABNORMAL HIGH (ref 65–99)

## 2016-01-13 LAB — PROTEIN ELECTROPHORESIS, SERUM
A/G Ratio: 0.4 — ABNORMAL LOW (ref 0.7–1.7)
ALPHA-1-GLOBULIN: 0.4 g/dL (ref 0.0–0.4)
ALPHA-2-GLOBULIN: 0.9 g/dL (ref 0.4–1.0)
Albumin ELP: 1.7 g/dL — ABNORMAL LOW (ref 2.9–4.4)
BETA GLOBULIN: 0.7 g/dL (ref 0.7–1.3)
GAMMA GLOBULIN: 1.8 g/dL (ref 0.4–1.8)
Globulin, Total: 3.8 g/dL (ref 2.2–3.9)
Total Protein ELP: 5.5 g/dL — ABNORMAL LOW (ref 6.0–8.5)

## 2016-01-13 LAB — ANTINUCLEAR ANTIBODIES, IFA: ANTINUCLEAR ANTIBODIES, IFA: NEGATIVE

## 2016-01-13 LAB — CBC WITH DIFFERENTIAL/PLATELET
BASOS ABS: 0 10*3/uL (ref 0.0–0.1)
Basophils Relative: 0 %
Eosinophils Absolute: 0.1 10*3/uL (ref 0.0–0.7)
Eosinophils Relative: 1 %
HEMATOCRIT: 36.6 % — AB (ref 39.0–52.0)
HEMOGLOBIN: 12 g/dL — AB (ref 13.0–17.0)
LYMPHS ABS: 1.7 10*3/uL (ref 0.7–4.0)
LYMPHS PCT: 8 %
MCH: 27.3 pg (ref 26.0–34.0)
MCHC: 32.8 g/dL (ref 30.0–36.0)
MCV: 83.4 fL (ref 78.0–100.0)
Monocytes Absolute: 1.8 10*3/uL — ABNORMAL HIGH (ref 0.1–1.0)
Monocytes Relative: 8 %
NEUTROS ABS: 18.4 10*3/uL — AB (ref 1.7–7.7)
NEUTROS PCT: 83 %
Platelets: 325 10*3/uL (ref 150–400)
RBC: 4.39 MIL/uL (ref 4.22–5.81)
RDW: 14 % (ref 11.5–15.5)
WBC: 22 10*3/uL — AB (ref 4.0–10.5)

## 2016-01-13 LAB — RENAL FUNCTION PANEL
ALBUMIN: 1.7 g/dL — AB (ref 3.5–5.0)
ANION GAP: 10 (ref 5–15)
BUN: 30 mg/dL — AB (ref 6–20)
CHLORIDE: 103 mmol/L (ref 101–111)
CO2: 21 mmol/L — ABNORMAL LOW (ref 22–32)
Calcium: 8 mg/dL — ABNORMAL LOW (ref 8.9–10.3)
Creatinine, Ser: 2.17 mg/dL — ABNORMAL HIGH (ref 0.61–1.24)
GFR, EST AFRICAN AMERICAN: 34 mL/min — AB (ref >60)
GFR, EST NON AFRICAN AMERICAN: 30 mL/min — AB (ref >60)
Glucose, Bld: 233 mg/dL — ABNORMAL HIGH (ref 65–99)
PHOSPHORUS: 3.4 mg/dL (ref 2.5–4.6)
POTASSIUM: 3.8 mmol/L (ref 3.5–5.1)
Sodium: 134 mmol/L — ABNORMAL LOW (ref 135–145)

## 2016-01-13 LAB — C3 COMPLEMENT: C3 COMPLEMENT: 86 mg/dL (ref 82–167)

## 2016-01-13 LAB — C4 COMPLEMENT: Complement C4, Body Fluid: 18 mg/dL (ref 14–44)

## 2016-01-13 LAB — HEPARIN LEVEL (UNFRACTIONATED): Heparin Unfractionated: <0.1 IU/mL — ABNORMAL LOW (ref 0.30–0.70)

## 2016-01-13 LAB — VANCOMYCIN, TROUGH: Vancomycin Tr: 20 ug/mL (ref 10.0–20.0)

## 2016-01-13 SURGERY — AMPUTATION BELOW KNEE
Anesthesia: Regional | Site: Leg Lower | Laterality: Right

## 2016-01-13 MED ORDER — ACETAMINOPHEN 325 MG PO TABS
650.0000 mg | ORAL_TABLET | Freq: Four times a day (QID) | ORAL | Status: DC | PRN
Start: 2016-01-13 — End: 2016-01-17

## 2016-01-13 MED ORDER — FENTANYL CITRATE (PF) 250 MCG/5ML IJ SOLN
INTRAMUSCULAR | Status: AC
  Filled 2016-01-13: qty 5

## 2016-01-13 MED ORDER — ACETAMINOPHEN 325 MG PO TABS: 325.0000 mg | ORAL_TABLET | ORAL | Status: DC | PRN

## 2016-01-13 MED ORDER — 0.9 % SODIUM CHLORIDE (POUR BTL) OPTIME
TOPICAL | Status: DC | PRN
  Administered 2016-01-13: 1000 mL

## 2016-01-13 MED ORDER — OXYCODONE HCL 5 MG/5ML PO SOLN: 5.0000 mg | Freq: Once | ORAL | Status: DC | PRN

## 2016-01-13 MED ORDER — MIDAZOLAM HCL 2 MG/2ML IJ SOLN
INTRAMUSCULAR | Status: AC
  Administered 2016-01-13: 2 mg via INTRAVENOUS
  Filled 2016-01-13: qty 2

## 2016-01-13 MED ORDER — INSULIN ASPART 100 UNIT/ML ~~LOC~~ SOLN
0.0000 [IU] | SUBCUTANEOUS | Status: DC
  Administered 2016-01-14: 5 [IU] via SUBCUTANEOUS
  Administered 2016-01-14: 3 [IU] via SUBCUTANEOUS

## 2016-01-13 MED ORDER — HEPARIN (PORCINE) IN NACL 100-0.45 UNIT/ML-% IJ SOLN
2150.0000 [IU]/h | INTRAMUSCULAR | Status: DC
  Administered 2016-01-14 (×2): 2150 [IU]/h via INTRAVENOUS
  Filled 2016-01-13 (×3): qty 250

## 2016-01-13 MED ORDER — PROPOFOL 500 MG/50ML IV EMUL
INTRAVENOUS | Status: DC | PRN
  Administered 2016-01-13: 50 ug/kg/min via INTRAVENOUS

## 2016-01-13 MED ORDER — ACETAMINOPHEN 160 MG/5ML PO SOLN: 325.0000 mg | ORAL | Status: DC | PRN

## 2016-01-13 MED ORDER — PROPOFOL 500 MG/50ML IV EMUL
INTRAVENOUS | Status: AC
  Filled 2016-01-13: qty 50

## 2016-01-13 MED ORDER — CHLORHEXIDINE GLUCONATE CLOTH 2 % EX PADS
6.0000 | MEDICATED_PAD | Freq: Every day | CUTANEOUS | Status: DC
  Administered 2016-01-17: 6 via TOPICAL

## 2016-01-13 MED ORDER — OXYCODONE HCL 5 MG PO TABS: 5.0000 mg | ORAL_TABLET | Freq: Once | ORAL | Status: DC | PRN

## 2016-01-13 MED ORDER — METHOCARBAMOL 1000 MG/10ML IJ SOLN: 500.0000 mg | Freq: Four times a day (QID) | INTRAVENOUS | Status: DC | PRN

## 2016-01-13 MED ORDER — INSULIN ASPART 100 UNIT/ML ~~LOC~~ SOLN
0.0000 [IU] | SUBCUTANEOUS | Status: DC
  Administered 2016-01-13: 7 [IU] via SUBCUTANEOUS

## 2016-01-13 MED ORDER — ONDANSETRON HCL 4 MG/2ML IJ SOLN
4.0000 mg | Freq: Four times a day (QID) | INTRAMUSCULAR | Status: DC | PRN
  Administered 2016-01-16: 4 mg via INTRAVENOUS
  Filled 2016-01-13: qty 2

## 2016-01-13 MED ORDER — BUPIVACAINE-EPINEPHRINE (PF) 0.5% -1:200000 IJ SOLN
INTRAMUSCULAR | Status: DC | PRN
  Administered 2016-01-13: 20 mL via PERINEURAL
  Administered 2016-01-13: 30 mL via PERINEURAL

## 2016-01-13 MED ORDER — HYDROMORPHONE HCL 1 MG/ML IJ SOLN: 0.2500 mg | INTRAMUSCULAR | Status: DC | PRN

## 2016-01-13 MED ORDER — PROPOFOL 10 MG/ML IV BOLUS
INTRAVENOUS | Status: AC
  Filled 2016-01-13: qty 20

## 2016-01-13 MED ORDER — SODIUM CHLORIDE 0.9 % IV SOLN
INTRAVENOUS | Status: DC
  Administered 2016-01-14: 02:00:00 via INTRAVENOUS

## 2016-01-13 MED ORDER — HYDROMORPHONE HCL 1 MG/ML IJ SOLN: 1.0000 mg | INTRAMUSCULAR | Status: DC | PRN

## 2016-01-13 MED ORDER — ACETAMINOPHEN 650 MG RE SUPP: 650.0000 mg | Freq: Four times a day (QID) | RECTAL | Status: DC | PRN

## 2016-01-13 MED ORDER — INSULIN GLARGINE 100 UNIT/ML ~~LOC~~ SOLN
15.0000 [IU] | Freq: Every day | SUBCUTANEOUS | Status: DC
  Administered 2016-01-13: 15 [IU] via SUBCUTANEOUS
  Filled 2016-01-13 (×2): qty 0.15

## 2016-01-13 MED ORDER — VANCOMYCIN HCL IN DEXTROSE 1-5 GM/200ML-% IV SOLN
1000.0000 mg | INTRAVENOUS | Status: DC
  Administered 2016-01-13 – 2016-01-14 (×2): 1000 mg via INTRAVENOUS
  Filled 2016-01-13 (×2): qty 200

## 2016-01-13 MED ORDER — METOCLOPRAMIDE HCL 5 MG/ML IJ SOLN: 5.0000 mg | Freq: Three times a day (TID) | INTRAMUSCULAR | Status: DC | PRN

## 2016-01-13 MED ORDER — MUPIROCIN 2 % EX OINT
1.0000 "application " | TOPICAL_OINTMENT | Freq: Two times a day (BID) | CUTANEOUS | Status: DC
  Administered 2016-01-13 – 2016-01-17 (×9): 1 via NASAL
  Filled 2016-01-13 (×2): qty 22

## 2016-01-13 MED ORDER — FENTANYL CITRATE (PF) 100 MCG/2ML IJ SOLN
INTRAMUSCULAR | Status: AC
  Administered 2016-01-13 (×2): 50 ug via INTRAVENOUS
  Filled 2016-01-13: qty 2

## 2016-01-13 MED ORDER — ONDANSETRON HCL 4 MG PO TABS: 4.0000 mg | ORAL_TABLET | Freq: Four times a day (QID) | ORAL | Status: DC | PRN

## 2016-01-13 MED ORDER — METOCLOPRAMIDE HCL 5 MG PO TABS: 5.0000 mg | ORAL_TABLET | Freq: Three times a day (TID) | ORAL | Status: DC | PRN

## 2016-01-13 MED ORDER — OXYCODONE HCL 5 MG PO TABS
5.0000 mg | ORAL_TABLET | ORAL | Status: DC | PRN
  Administered 2016-01-14: 10 mg via ORAL
  Filled 2016-01-13: qty 2

## 2016-01-13 MED ORDER — SODIUM CHLORIDE 0.9 % IV SOLN
INTRAVENOUS | Status: DC | PRN
  Administered 2016-01-13: 21:00:00 via INTRAVENOUS

## 2016-01-13 MED ORDER — MIDAZOLAM HCL 2 MG/2ML IJ SOLN
INTRAMUSCULAR | Status: AC
  Filled 2016-01-13: qty 2

## 2016-01-13 MED ORDER — METHOCARBAMOL 500 MG PO TABS
500.0000 mg | ORAL_TABLET | Freq: Four times a day (QID) | ORAL | Status: DC | PRN
  Administered 2016-01-14 – 2016-01-17 (×6): 500 mg via ORAL
  Filled 2016-01-13 (×6): qty 1

## 2016-01-13 SURGICAL SUPPLY — 36 items
BLADE SAW RECIP 87.9 MT (BLADE) ×3 IMPLANT
BLADE SURG 21 STRL SS (BLADE) ×3 IMPLANT
BNDG COHESIVE 6X5 TAN STRL LF (GAUZE/BANDAGES/DRESSINGS) ×6 IMPLANT
BNDG GAUZE ELAST 4 BULKY (GAUZE/BANDAGES/DRESSINGS) ×6 IMPLANT
CANISTER WOUND CARE 500ML ATS (WOUND CARE) ×3 IMPLANT
COVER SURGICAL LIGHT HANDLE (MISCELLANEOUS) ×6 IMPLANT
CUFF TOURNIQUET SINGLE 34IN LL (TOURNIQUET CUFF) ×3 IMPLANT
DRAPE EXTREMITY T 121X128X90 (DRAPE) ×3 IMPLANT
DRAPE INCISE IOBAN 66X45 STRL (DRAPES) IMPLANT
DRAPE PROXIMA HALF (DRAPES) ×3 IMPLANT
DRAPE U-SHAPE 47X51 STRL (DRAPES) ×3 IMPLANT
DRSG ADAPTIC 3X8 NADH LF (GAUZE/BANDAGES/DRESSINGS) ×3 IMPLANT
DRSG PAD ABDOMINAL 8X10 ST (GAUZE/BANDAGES/DRESSINGS) ×3 IMPLANT
DURAPREP 26ML APPLICATOR (WOUND CARE) ×3 IMPLANT
ELECT REM PT RETURN 9FT ADLT (ELECTROSURGICAL) ×3
ELECTRODE REM PT RTRN 9FT ADLT (ELECTROSURGICAL) ×1 IMPLANT
GAUZE SPONGE 4X4 12PLY STRL (GAUZE/BANDAGES/DRESSINGS) ×3 IMPLANT
GLOVE BIOGEL PI IND STRL 9 (GLOVE) ×1 IMPLANT
GLOVE BIOGEL PI INDICATOR 9 (GLOVE) ×2
GLOVE SURG ORTHO 9.0 STRL STRW (GLOVE) ×3 IMPLANT
GOWN STRL REUS W/ TWL XL LVL3 (GOWN DISPOSABLE) ×2 IMPLANT
GOWN STRL REUS W/TWL XL LVL3 (GOWN DISPOSABLE) ×4
KIT BASIN OR (CUSTOM PROCEDURE TRAY) ×3 IMPLANT
KIT ROOM TURNOVER OR (KITS) ×3 IMPLANT
MANIFOLD NEPTUNE II (INSTRUMENTS) ×3 IMPLANT
NS IRRIG 1000ML POUR BTL (IV SOLUTION) ×3 IMPLANT
PACK GENERAL/GYN (CUSTOM PROCEDURE TRAY) ×3 IMPLANT
PAD ARMBOARD 7.5X6 YLW CONV (MISCELLANEOUS) ×3 IMPLANT
PREVENA INCISION MGT 90 150 (MISCELLANEOUS) ×3 IMPLANT
SPONGE LAP 18X18 X RAY DECT (DISPOSABLE) IMPLANT
STAPLER VISISTAT 35W (STAPLE) IMPLANT
STOCKINETTE IMPERVIOUS LG (DRAPES) ×3 IMPLANT
SUT SILK 2 0 (SUTURE) ×2
SUT SILK 2-0 18XBRD TIE 12 (SUTURE) ×1 IMPLANT
SUT VIC AB 1 CTX 27 (SUTURE) ×6 IMPLANT
TOWEL OR 17X26 10 PK STRL BLUE (TOWEL DISPOSABLE) ×3 IMPLANT

## 2016-01-13 NOTE — Addendum Note (Signed)
Addendum  created 01/13/16 2255 by Oleta Mouse, MD   Modules edited: Anesthesia Blocks and Procedures, Clinical Notes   Clinical Notes:  File: [id number]

## 2016-01-13 NOTE — Anesthesia Preprocedure Evaluation (Signed)
Anesthesia Evaluation  Patient identified by MRN, date of birth, ID band Patient awake    Reviewed: Allergy & Precautions, NPO status , Patient's Chart, lab work & pertinent test results  History of Anesthesia Complications Negative for: history of anesthetic complications  Airway Mallampati: III  TM Distance: >3 FB Neck ROM: Full    Dental  (+) Teeth Intact   Pulmonary shortness of breath, neg sleep apnea, neg COPD, neg recent URI,    breath sounds clear to auscultation       Cardiovascular hypertension, Pt. on medications and Pt. on home beta blockers +CHF  + dysrhythmias Atrial Fibrillation  Rhythm:Regular + Systolic murmurs    Neuro/Psych PSYCHIATRIC DISORDERS Depression    GI/Hepatic negative GI ROS, Neg liver ROS,   Endo/Other  diabetes, Type 2, Insulin Dependent  Renal/GU CRFRenal disease     Musculoskeletal  (+) Arthritis ,   Abdominal   Peds  Hematology  (+) anemia ,   Anesthesia Other Findings   Reproductive/Obstetrics                             Anesthesia Physical Anesthesia Plan  ASA: III  Anesthesia Plan: Regional   Post-op Pain Management:    Induction:   Airway Management Planned:   Additional Equipment: None  Intra-op Plan:   Post-operative Plan:   Informed Consent: I have reviewed the patients History and Physical, chart, labs and discussed the procedure including the risks, benefits and alternatives for the proposed anesthesia with the patient or authorized representative who has indicated his/her understanding and acceptance.   Dental advisory given  Plan Discussed with: CRNA and Surgeon  Anesthesia Plan Comments:         Anesthesia Quick Evaluation

## 2016-01-13 NOTE — Op Note (Signed)
   Date of Surgery: 01/13/2016  INDICATIONS: Mr. Caple is a 70 y.o.-year-old male who presents with gangrenous changes to the right foot. He has failed limb salvage and presents for transtibial amputation.Marland Kitchen  PREOPERATIVE DIAGNOSIS: gangrene right foot  POSTOPERATIVE DIAGNOSIS: Same.  PROCEDURE: Transtibial amputation right Application of Prevena wound VAC  SURGEON: Sharol Given, M.D.  ANESTHESIA:  general  IV FLUIDS AND URINE: See anesthesia.  ESTIMATED BLOOD LOSS: mminimal mL.  COMPLICATIONS: None.  DESCRIPTION OF PROCEDURE: The patient was brought to the operating room and underwent a general anesthetic. After adequate levels of anesthesia were obtained patient's lower extremity was prepped using DuraPrep draped into a sterile field. A timeout was called. The foot was draped out of the sterile field with impervious stockinette. A transverse incision was made 11 cm distal to the tibial tubercle. This curved proximally and a large posterior flap was created. The tibia was transected 1 cm proximal to the skin incision. The fibula was transected just proximal to the tibial incision. The tibia was beveled anteriorly. A large posterior flap was created. The sciatic nerve was pulled cut and allowed to retract. The vascular bundles were suture ligated with 2-0 silk. The deep and superficial fascial layers were closed using #1 Vicryl. The skin was closed using staples and 2-0 nylon. The wound was covered with a Prevena wound VAC. There was a good suction fit. Patient was extubated taken to the PACU in stable condition.  [name], MD [place] 10:36 PM

## 2016-01-13 NOTE — Progress Notes (Signed)
Patient ID: Thomas Wright, male   DOB: 25-Sep-1945, 70 y.o.   MRN: [id number] S:Feels better but wants to have surgery so he can eat and drink. O:BP 142/63 mmHg  Pulse 65  Temp(Src) 97.9 F (36.6 C) (Oral)  Resp 16  Ht 6' (1.829 m)  Wt 104.781 kg (231 lb)  BMI 31.32 kg/m2  SpO2 99%  Intake/Output Summary (Last 24 hours) at 01/13/16 1330 Last data filed at 01/13/16 0900  Gross per 24 hour  Intake    240 ml  Output    950 ml  Net   -710 ml   Intake/Output: I/O last 3 completed shifts: In: 840 [P.O.:840] Out: 1430 [Urine:1326; Emesis/NG output:100; Stool:4]  Intake/Output this shift:  Total I/O In: -  Out: 375 [Urine:375] Weight change:  Gen:WD WN WM in NAD CVS:no rub Resp:cta [id number] right foot with purulent drainage, no edema.   Recent Labs Lab 01/09/16 1642 01/10/16 0436 01/11/16 0420 01/12/16 0047 01/13/16 0910  NA 135 132* 132* 134* 134*  K 4.2 3.5 5.0 3.7 3.8  CL 92* 97* 100* 103 103  CO2 23 22 24 22  21*  GLUCOSE 328* 352* 193* 168* 233*  BUN 17 22* 30* 32* 30*  CREATININE 1.14 1.45* 2.16* 2.34* 2.17*  ALBUMIN 2.6*  --   --  1.7* 1.7*  CALCIUM 9.4 8.1* 8.1* 7.9* 8.0*  PHOS  --   --   --   --  3.4  AST 16  --   --  13*  --   ALT 11*  --   --  7*  --    Liver Function Tests:  Recent Labs Lab 01/09/16 1642 01/12/16 0047 01/13/16 0910  AST 16 13*  --   ALT 11* 7*  --   ALKPHOS 142* 91  --   BILITOT 1.4* 0.4  --   PROT 8.6* 6.0*  --   ALBUMIN 2.6* 1.7* 1.7*   No results for input(s): LIPASE, AMYLASE in the last 168 hours. No results for input(s): AMMONIA in the last 168 hours. CBC:  Recent Labs Lab 01/09/16 1642 01/10/16 0436 01/11/16 0420 01/12/16 0047 01/13/16 0910  WBC 28.3* 28.9* 27.5* 25.9* 22.0*  NEUTROABS 25.2* 25.2*  --  21.7* 18.4*  HGB 15.0 12.9* 12.1* 11.9* 12.0*  HCT 43.9 38.7* 36.9* 35.5* 36.6*  MCV 83.5 82.2 84.1 82.2 83.4  PLT 396 324 316 289 325   Cardiac Enzymes:  Recent Labs Lab 01/11/16 0420   CKTOTAL 61   CBG:  Recent Labs Lab 01/12/16 1142 01/12/16 1710 01/12/16 2139 01/13/16 0622 01/13/16 1207  GLUCAP 199* 266* 308* 222* 163*    Iron Studies: No results for input(s): IRON, TIBC, TRANSFERRIN, FERRITIN in the last 72 hours. Studies/Results: No results found. Marland Kitchen amiodarone  200 mg Oral Daily  . atorvastatin  10 mg Oral q morning - 10a  . carvedilol  6.25 mg Oral BID WC  . ceFEPime (MAXIPIME) IV  2 g Intravenous Q24H  . Chlorhexidine Gluconate Cloth  6 each Topical Daily  . feeding supplement (PRO-STAT SUGAR FREE 64)  30 mL Oral BID  . insulin aspart  0-20 Units Subcutaneous Q4H  . insulin glargine  30 Units Subcutaneous QHS  . metronidazole  500 mg Intravenous Q8H  . multivitamin with minerals  1 tablet Oral Daily  . mupirocin ointment  1 application Nasal BID    BMET    Component Value Date/Time   NA 134* 01/13/2016 0910   K 3.8 01/13/2016 0910  CL 103 01/13/2016 0910   CO2 21* 01/13/2016 0910   GLUCOSE 233* 01/13/2016 0910   BUN 30* 01/13/2016 0910   CREATININE 2.17* 01/13/2016 0910   CALCIUM 8.0* 01/13/2016 0910   GFRNONAA 30* 01/13/2016 0910   GFRAA 34* 01/13/2016 0910   CBC    Component Value Date/Time   WBC 22.0* 01/13/2016 0910   RBC 4.39 01/13/2016 0910   HGB 12.0* 01/13/2016 0910   HCT 36.6* 01/13/2016 0910   PLT 325 01/13/2016 0910   MCV 83.4 01/13/2016 0910   MCH 27.3 01/13/2016 0910   MCHC 32.8 01/13/2016 0910   RDW 14.0 01/13/2016 0910   LYMPHSABS 1.7 01/13/2016 0910   MONOABS 1.8* 01/13/2016 0910   EOSABS 0.1 01/13/2016 0910   BASOSABS 0.0 01/13/2016 0910    Assessment/Plan:  1. AKI- in setting of early sepsis with gangrenous/ischemic diabetic foot infection.  Presumably ischemic ATN.  No hydro on [place], h/o nephrotoxic agents (although vanco level pending), and UA not supportive of acute GN.  Scr has already started to improve.  (serologies negative, low FeNa consistent with pre-renal.  Negative SPEP) 1. Continue with  conservative management 2. Monitor volume closely given h/o CHF 3. Renal dose meds per pharmacy 4. Avoid ACE/ARB/NSAIDs/CoxII-I's/IV contrast 2. R ischemic foot/diabetic foot infection- s/p RBKA per Dr. Sharol Given 3. Early sepsis- with episodes of hypotension.  BP meds on hold. 4. A fib s/p AVR- on heparin drip for surgery, was on eliquis.  Per primary svc 5. IDDM- per primary svc  Donetta Potts

## 2016-01-13 NOTE — H&P (View-Only) (Signed)
ORTHOPAEDIC CONSULTATION  REQUESTING PHYSICIAN: Louellen Molder, MD  Chief Complaint: Dry gangrene right foot  HPI: Thomas Wright is a 70 y.o. male who presents with dry gangrene of the right foot which patient states he's had about 2 weeks. Patient is a diabetic with hypertension and a history of A. fib. Patient has been on Eliquis for the A. fib. Patient states he's had history of blood clots in his heart.  Past Medical History  Diagnosis Date  . Hypertension   . PAF (paroxysmal atrial fibrillation) ([place])     a. Postop 2008. b. recurrent afib 08/2014 with LAA clot noted on TEE. EF was down again at 25%. He was anticoagulated with Eliquis and placed on Amiodarone.He ultimately underwent TEE-DCCV 08/2014.  Marland Kitchen Hypertriglyceridemia   . Cardiomyopathy ([place])     EF originally 20%-now 45%, no CAD on cath  . Chronic combined systolic and diastolic CHF (congestive heart failure) ([place])     a. 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 Reported h/o cath in 2008 without significant CAD.  Marland Kitchen Type II diabetes mellitus ([place])   . Arthritis     .  Marland Kitchen Depression     "a little right now" (04/19/2015)  . S/P aortic valve replacement with bioprosthetic valve     a. bioprosthetic AVR in 05/2007 in [place].  . Hyperlipidemia    Past Surgical History  Procedure Laterality Date  . Aortic valve replacement  05/2007    with tissue graft  . I&d extremity Right 05/05/2014    Procedure: IRRIGATION AND DEBRIDEMENT EXTREMITY;  Surgeon: [name], MD;  Location: [place];  Service: Orthopedics;  Laterality: Right;  . Tee without cardioversion N/A 07/23/2014    Procedure: TRANSESOPHAGEAL ECHOCARDIOGRAM (TEE);  Surgeon: Josue Hector, MD;  Location: [facility] ENDOSCOPY;  Service: Cardiovascular;  Laterality: N/A;  . Cardioversion N/A 07/23/2014    Procedure: CARDIOVERSION;  Surgeon: Josue Hector, MD;  Location: [facility] ENDOSCOPY;  Service:  Cardiovascular;  Laterality: N/A;  . Tee without cardioversion N/A 09/01/2014    Procedure: TRANSESOPHAGEAL ECHOCARDIOGRAM (TEE);  Surgeon: [name], MD;  Location: [facility] ENDOSCOPY;  Service: Cardiovascular;  Laterality: N/A;  . Cardioversion N/A 09/01/2014    Procedure: CARDIOVERSION;  Surgeon: [name], MD;  Location: [facility] ENDOSCOPY;  Service: Cardiovascular;  Laterality: N/A;  . Amputation Right 07/29/2014    Procedure: AMPUTATION RIGHT LONG FINGER;  Surgeon: [name], MD;  Location: [place];  Service: Orthopedics;  Laterality: Right;  . Cardiac valve replacement    . Tonsillectomy  1954  . Cardiac catheterization  "several"   Social History   Social History  . Marital Status: Divorced    Spouse Name: N/A  . Number of Children: N/A  . Years of Education: N/A   Occupational History  . retired     Social History Main Topics  . Smoking status: Never Smoker   . Smokeless tobacco: Never Used  . Alcohol Use: 0.0 oz/week    0 Standard drinks or equivalent per week     Comment: 04/19/2015 "might have a beer or 2, 2-3 times/yr"  . Drug Use: No  . Sexual Activity: No   Other Topics Concern  . None   Social History Narrative   Family History  Problem Relation Age of Onset  . Colon cancer Father   . Diabetes Father   . Liver disease Brother   . Heart murmur  Child   . Congenital heart disease Other   . Diabetes Maternal Grandmother   . Heart disease Maternal Grandfather     A Fib   - negative except otherwise stated in the family history section Allergies  Allergen Reactions  . Amoxicill-Clarithro-Omeprazole Diarrhea and Nausea And Vomiting   Prior to Admission medications   Medication Sig Start Date End Date Taking? Authorizing Provider  amiodarone (PACERONE) 200 MG tablet Take 200 mg by mouth daily. One daily   Yes Historical Provider, MD  apixaban (ELIQUIS) 5 MG TABS tablet Take 1 tablet (5 mg total) by mouth 2 (two) times daily. 04/22/15  Yes Dayna N Dunn, PA-C    aspirin EC 81 MG tablet Take 1 tablet (81 mg total) by mouth daily. 04/22/15  Yes Dayna N Dunn, PA-C  atorvastatin (LIPITOR) 10 MG tablet Take 1 tablet (10 mg total) by mouth every morning. 06/01/15  Yes Jerline Pain, MD  carvedilol (COREG) 6.25 MG tablet Take 1 tablet (6.25 mg total) by mouth 2 (two) times daily with a meal. 08/17/15  Yes Jerline Pain, MD  DHA-EPA-Coenzyme Q10-Vitamin E (CO Q-10 VITAMIN E FISH OIL PO) Take 1,000 mg by mouth 2 (two) times daily.   Yes Historical Provider, MD  furosemide (LASIX) 20 MG tablet Take 1 tablet (20 mg total) by mouth daily. 08/23/15  Yes Jerline Pain, MD  guaiFENesin-dextromethorphan [facility] DM) 100-10 MG/5ML syrup Take 5 mLs by mouth at bedtime as needed for cough.   Yes Historical Provider, MD  insulin glargine (LANTUS) 100 UNIT/ML injection Inject 0.25 mLs (25 Units total) into the skin at bedtime. 09/23/15  Yes Elayne Snare, MD  metFORMIN (GLUCOPHAGE) 1000 MG tablet Take 1,000 mg by mouth 2 (two) times daily with a meal.   Yes Historical Provider, MD  Multiple Vitamin (MULTIVITAMIN) tablet Take 1 tablet by mouth daily.   Yes Historical Provider, MD  sildenafil (VIAGRA) 100 MG tablet Take 1 tablet (100 mg total) by mouth daily as needed for erectile dysfunction (1 hr prior to sexual activity). 10/27/15  Yes Jerline Pain, MD  Blood Glucose Monitoring Suppl (ACCU-CHEK NANO SMARTVIEW) w/Device KIT Use to check blood sugar 4 times per day dx code E11.9 01/09/16   Elayne Snare, MD  glucose blood (ACCU-CHEK SMARTVIEW) test strip Use as instructed to check blood sugar 4 times per day dx code E11.9 07/21/15   Elayne Snare, MD  Insulin Syringe-Needle U-100 31G X 5/16" 0.3 ML MISC Use 3 per day to inject insulin 06/20/15   Elayne Snare, MD   Dg Chest 2 View  01/09/2016  CLINICAL DATA:  Possible sepsis.  Large foot infection. EXAM: CHEST  2 VIEW COMPARISON:  Chest radiograph 04/20/2015 FINDINGS: Low lung volumes. Status post median sternotomy. Stable cardiac and mediastinal  contours. No consolidative pulmonary opacities. No pleural effusion or pneumothorax. Regional skeleton is unremarkable. IMPRESSION: No active cardiopulmonary disease. Electronically Signed   By: Lovey Newcomer M.D.   On: 01/09/2016 17:44   Dg Foot 2 Views Right  01/09/2016  CLINICAL DATA:  Possible sepsis.  Necrotic tissue and great toe. EXAM: RIGHT FOOT - 2 VIEW COMPARISON:  None. FINDINGS: No acute fracture. No dislocation. Mild vascular calcifications. There is mild soft tissue swelling about the forefoot but no emphysema. Minimal spurring at the inferior calcaneus. IMPRESSION: No acute bony pathology. Soft tissue swelling over the forefoot is noted. Electronically Signed   By: Marybelle Killings M.D.   On: 01/09/2016 17:46   - pertinent xrays,  CT, MRI studies were reviewed and independently interpreted  Positive ROS: All other systems have been reviewed and were otherwise negative with the exception of those mentioned in the HPI and as above.  Physical Exam: General: Alert, no acute distress Cardiovascular: No pedal edema Respiratory: No cyanosis, no use of accessory musculature GI: No organomegaly, abdomen is soft and non-tender Skin: Dry gangrene of the right foot involving almost the entire foot. Patient has a Wagner grade 5 ulceration.  Neurologic: Patient does not have protective sensation. Psychiatric: Patient is competent for consent with normal mood and affect Lymphatic: No axillary or cervical lymphadenopathy  MUSCULOSKELETAL:  On examination patient has a faintly palpable dorsalis pedis pulse. He has strong popliteal pulse. His ankle brachial indices shows adequate flow to the right lower extremity. Patient's gangrenous changes are most consistent with a thrombotic event most likely from his A. fib throwing o'clock down to the foot and ankle with acute ischemic changes. Patient has a white cell count of approximately 29,000 with a elevated CRP and sedimentation  rate.  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 fib. With good circulation to the right lower extremity.  Plan: We will plan for a transtibial amputation on Friday. Patient may have difficulty being discharged to home and I will request evaluation for rehabilitation placement postoperatively.  Thank you for the consult and the opportunity to see Thomas Wright, [place] [phone] 5:12 PM

## 2016-01-13 NOTE — Progress Notes (Signed)
ANTICOAGULATION & ANTIBIOTIC CONSULT NOTE - Follow Up Consult  Pharmacy Consult:  Heparin + Vancomycin / Cefepime Indication: atrial fibrillation + sepsis  Allergies  Allergen Reactions  . Amoxicill-Clarithro-Omeprazole Diarrhea and Nausea And Vomiting    Patient Measurements: Height: 6' (182.9 cm) Weight: 231 lb (104.781 kg) IBW/kg (Calculated) : 77.6 Heparin Dosing Weight: 99 kg  Vital Signs: Temp: 97.9 F (36.6 C) (04/28 1300) Temp Source: Oral (04/28 1300) BP: 142/63 mmHg (04/28 1300) Pulse Rate: 65 (04/28 1300)  Labs:  Recent Labs  01/11/16 0420  01/12/16 0047 01/12/16 0500 01/13/16 0910 01/13/16 1100  HGB 12.1*  --  11.9*  --  12.0*  --   HCT 36.9*  --  35.5*  --  36.6*  --   PLT 316  --  289  --  325  --   LABPROT  --   --  19.3*  --   --   --   INR  --   --  1.63*  --   --   --   HEPARINUNFRC 0.15*  < > 0.40 0.30  --  <0.10*  CREATININE 2.16*  --  2.34*  --  2.17*  --   CKTOTAL 61  --   --   --   --   --   < > = values in this interval not displayed.  Estimated Creatinine Clearance: 40.8 mL/min (by C-G formula based on Cr of 2.17).    Assessment: 22 YOM with history of Afib to continue on IV heparin while Eliquis is on hold.  Heparin level was subtherapeutic this morning because the drip was stopped, has now been restarted but will likely be held prior to surgery tonight.  No bleeding reported.  Pharmacy managing vancomycin and cefepime for sepsis.  Patient is also on Flagyl.  Patient remains afebrile and his WBC is improving. Vancomycin random was 20 this morning and morning dose was held. Restarting at lower dose this afternoon. Pt was likely accumulating on q 12 hour dosing. SCr trend back down today.  Vanc 4/24 >> Cefepime 4/25 >> Flagyl 4/25 >>  4/24 UCx - negative 4/24 BCx2 - NGTD   Goal of Therapy:  Heparin level 0.3-0.7 units/ml Monitor platelets by anticoagulation protocol: Yes  Vanc trough 15-20 mcg/mL    Plan:  - Continue heparin  gtt at 2150 units/hr - Daily HL / CBC - Restart vanc 1 gram q 24 hour - Change cefepime to 2gm IV Q24H - Monitor clinical progress, c/s, renal function, abx plan/LOT, VT@SS  as indicated - Consider discontinuing Flagyl or change cefepime/Flagyl to Zosyn   Thank you for allowing [place] to participate in this patients care. Jens Som, PharmD Pager: [phone] 01/13/2016, 1:41 PM

## 2016-01-13 NOTE — Patient Outreach (Addendum)
[facility]) Care Management  01/13/2016  JASMEET MARON Aug 05, 1946 [id number]   Phone call to patient to discuss need for a wheelchair ramp and resources for housekeeping.  Per patient, he is currently in the hospital awaiting surgery(right below knee amputation).  He will most likely go to a skilled nursing faciliyt for rehabilitation.    Plan: This social worker will follow back up with patient following his inpatient stay.    [name] [facility] Care Management [phone]

## 2016-01-13 NOTE — Progress Notes (Signed)
ANTICOAGULATION CONSULT NOTE - Follow Up Consult  Pharmacy Consult for Heparin  Indication: atrial fibrillation  Allergies  Allergen Reactions  . Amoxicill-Clarithro-Omeprazole Diarrhea and Nausea And Vomiting   Patient Measurements: Height: 6' (182.9 cm) Weight: 231 lb (104.781 kg) IBW/kg (Calculated) : 77.6  Vital Signs: Temp: 98.8 F (37.1 C) (04/28 2319) Temp Source: Oral (04/28 2319) BP: 142/55 mmHg (04/28 2319) Pulse Rate: 67 (04/28 2319)  Labs:  Recent Labs  01/11/16 0420  01/12/16 0047 01/12/16 0500 01/13/16 0910 01/13/16 1100  HGB 12.1*  --  11.9*  --  12.0*  --   HCT 36.9*  --  35.5*  --  36.6*  --   PLT 316  --  289  --  325  --   LABPROT  --   --  19.3*  --   --   --   INR  --   --  1.63*  --   --   --   HEPARINUNFRC 0.15*  < > 0.40 0.30  --  <0.10*  CREATININE 2.16*  --  2.34*  --  2.17*  --   CKTOTAL 61  --   --   --   --   --   < > = values in this interval not displayed.  Estimated Creatinine Clearance: 40.8 mL/min (by C-G formula based on Cr of 2.17).   Assessment: Was on heparin while holding Apixaban for surgery, pt is now s/p transtibial amputation tonight, OK to re-start heparin NOW per Dr. Sharol Given. Will re-start at previously therapeutic rate.   Goal of Therapy:  Heparin level 0.3-0.7 units/ml Monitor platelets by anticoagulation protocol: Yes   Plan:  -Re-start heparin at 2150 units/hr -0800 [facility]  Narda Bonds 01/13/2016,11:53 PM

## 2016-01-13 NOTE — Anesthesia Postprocedure Evaluation (Signed)
Anesthesia Post Note  Patient: Thomas Wright  Procedure(s) Performed: Procedure(s) (LRB): RIGHT BELOW KNEE AMPUTATION (Right) APPLICATION OF WOUND VAC (Right)  Patient location during evaluation: PACU Anesthesia Type: Regional and MAC Level of consciousness: awake Pain management: pain level controlled Vital Signs Assessment: post-procedure vital signs reviewed and stable Respiratory status: spontaneous breathing Cardiovascular status: stable Postop Assessment: no signs of nausea or vomiting Anesthetic complications: no    Last Vitals:  Filed Vitals:   01/13/16 2014 01/13/16 2237  BP: 144/57 140/70  Pulse: 68 72  Temp: 37.5 C 37.6 C  Resp: 16 15    Last Pain:  Filed Vitals:   01/13/16 2250  PainSc: 6                  [name]

## 2016-01-13 NOTE — Progress Notes (Signed)
Patient back on 5N from PACU.

## 2016-01-13 NOTE — Progress Notes (Signed)
Patient to OR

## 2016-01-13 NOTE — Anesthesia Procedure Notes (Addendum)
Anesthesia Regional Block:  Femoral nerve block  Pre-Anesthetic Checklist: ,, timeout performed, Correct Patient, Correct Site, Correct Laterality, Correct Procedure, Correct Position, site marked, Risks and benefits discussed, Surgical consent,  Pre-op evaluation,  At surgeon's request  Laterality: Lower and Right  Prep: chloraprep       Needles:  Injection technique: Single-shot  Needle Type: Echogenic Stimulator Needle          Additional Needles:  Procedures: ultrasound guided (picture in chart) and nerve stimulator Femoral nerve block  Nerve Stimulator or Paresthesia:  Response: quad, 0.5 mA,   Additional Responses:   Narrative:  Injection made incrementally with aspirations every 5 mL.  Performed by: Personally  Anesthesiologist: Hilja Kintzel  Additional Notes: H+P and labs reviewed, risks and benefits discussed with patient, procedure tolerated well without complications   Anesthesia Regional Block:  Popliteal block  Pre-Anesthetic Checklist: ,, timeout performed, Correct Patient, Correct Site, Correct Laterality, Correct Procedure, Correct Position, site marked, Risks and benefits discussed, Surgical consent,  Pre-op evaluation,  At surgeon's request  Laterality: Lower and Right  Prep: chloraprep       Needles:  Injection technique: Single-shot  Needle Type: Echogenic Stimulator Needle          Additional Needles:  Procedures: ultrasound guided (picture in chart) and nerve stimulator Popliteal block  Nerve Stimulator or Paresthesia:  Response: plantar, 0.5 mA,   Additional Responses:   Narrative:  Injection made incrementally with aspirations every 5 mL.  Performed by: Personally  Anesthesiologist: Christapher Gillian  Additional Notes: H+P and labs reviewed, risks and benefits discussed with patient, procedure tolerated well without complications

## 2016-01-13 NOTE — Interval H&P Note (Signed)
History and Physical Interval Note:  01/13/2016 6:38 AM  Thomas Wright  has presented today for surgery, with the diagnosis of Gangrene Right Foot  The various methods of treatment have been discussed with the patient and family. After consideration of risks, benefits and other options for treatment, the patient has consented to  Procedure(s): RIGHT BELOW KNEE AMPUTATION (Right) as a surgical intervention .  The patient's history has been reviewed, patient examined, no change in status, stable for surgery.  I have reviewed the patient's chart and labs.  Questions were answered to the patient's satisfaction.     [name]

## 2016-01-13 NOTE — Transfer of Care (Signed)
Immediate Anesthesia Transfer of Care Note  Patient: Thomas Wright  Procedure(s) Performed: Procedure(s): RIGHT BELOW KNEE AMPUTATION (Right) APPLICATION OF WOUND VAC (Right)  Patient Location: PACU  Anesthesia Type:MAC and Regional  Level of Consciousness: awake, alert  and oriented  Airway & Oxygen Therapy: Patient Spontanous Breathing  Post-op Assessment: Report given to RN and Post -op Vital signs reviewed and stable  Post vital signs: Reviewed and stable  Last Vitals:  Filed Vitals:   01/13/16 1300 01/13/16 2014  BP: 142/63 144/57  Pulse: 65 68  Temp: 36.6 C 37.5 C  Resp: 16 16    Last Pain:  Filed Vitals:   01/13/16 2015  PainSc: 6       Patients Stated Pain Goal: 2 ([id number])  Complications: No apparent anesthesia complications

## 2016-01-13 NOTE — Progress Notes (Signed)
[facility] Progress Note  Patient: Thomas Wright BUL:[id number]   PCP: [name], MD DOB: [birth year]   DOA: 01/09/2016   DOS: 01/13/2016   Date of Service: the patient was seen and examined on 01/13/2016 Outpatient Specialists: Tana Conch cardiology Pulmonary Praveen mannam  Subjective: She will remain elevated. Denies having any complaints of nausea vomiting or abdominal pain. Pain is well controlled as well. Nutrition: Tolerating oral diet  Brief hospital course: Patient was admitted on 01/09/2016, with complaint of fever chills and right foot discoloration, was found to have right foot gangrene with sepsis. Was started on broad-spectrum antibiotics, orthopedic was consulted and recommended transtibial amputation which is scheduled on 01/13/2016. Patient has chronic right plantar ulcer and has been following up with the wound care center but he stopped going there since last 2 weeks prior to admission. Patient also has mild worsening of his chronic kidney disease, nephrology was also consulted. Renal function appears to be stabilizing. Currently further plan is continue with the current plan of the surgery.  Assessment and Plan: 1. Diabetic foot infection ([place]) Met sepsis criteria on admission with fever, leukocytosis. Currently on vancomycin and cefepime and Flagyl. Follow blood cultures. Orthopedic has been consulted and the patient is scheduled for transfer to be an amputation on 01/13/2016. ABI shows within normal limits. Persistent leukocytosis but improving. With worsening renal function vancomycin is discontinued. Negative MRSA. No need for MRSA coverage continue cefepime  2. Acute kidney injury. Most likely in the setting of sepsis. CK normal. Continue IV hydration. Monitor renal function. Discontinue Lasix. Appreciate input from nephrology  3. Chronic combined CHF. Recent echocardiogram in August 2016 shows normal ejection fraction 50-55% with grade 1  diastolic dysfunction. Continue aspirin and statin and beta blocker. Monitor for volume overload  4.Atrial fibrillation CHA2DS2-VASc Score 4 We'll continue amiodarone and Coreg as well as aspirin  On Eliquis which is on hold for surgery Continue heparin infusion therapeutic dose  5. Uncontrolled insulin-dependent type 2 diabetes mellitus. Hemoglobin A1c 12.6 on April 24. Blood sugar mildly elevated. Pt without insulin for last 2-3 months prior to admission. With patient being nothing by mouth for the surgery will reduce the rate for the Lantus. I would also reduce patient's sliding scale from resistant to sensitive.  Activity: physical therapy consulted Bowel regimen: last BM 01/12/2016 Diet: Cardiac and diabetic diet DVT Prophylaxis: on therapeutic anticoagulation.  Advance goals of care discussion: Full code  Family Communication: family was present at bedside, at the time of interview. The pt provided permission to discuss medical plan with the family. Opportunity was given to ask question and all questions were answered satisfactorily.    Disposition:  Expected discharge date: 01/16/2016 Barriers to safe discharge: Postoperative course  Consultants: Orthopedics, nephrology Procedures: None  Antibiotics: Anti-infectives    Start     Dose/Rate Route Frequency Ordered Stop   01/13/16 1500  ceFEPIme (MAXIPIME) 2 g in dextrose 5 % 50 mL IVPB     2 g 100 mL/hr over 30 Minutes Intravenous Every 24 hours 01/12/16 1345     01/13/16 1500  vancomycin (VANCOCIN) IVPB 1000 mg/200 mL premix     1,000 mg 200 mL/hr over 60 Minutes Intravenous Every 24 hours 01/13/16 1330     01/12/16 1400  ceFEPIme (MAXIPIME) 1 g in dextrose 5 % 50 mL IVPB     1 g 100 mL/hr over 30 Minutes Intravenous  Once 01/12/16 1345 01/12/16 1516   01/12/16 0500  vancomycin (VANCOCIN) 1,250 mg in sodium  chloride 0.9 % 250 mL IVPB  Status:  Discontinued     1,250 mg 166.7 mL/hr over 90 Minutes Intravenous Every  24 hours 01/11/16 1353 01/12/16 0746   01/11/16 1800  ceFEPIme (MAXIPIME) 1 g in dextrose 5 % 50 mL IVPB  Status:  Discontinued     1 g 100 mL/hr over 30 Minutes Intravenous Every 12 hours 01/11/16 1354 01/12/16 1345   01/10/16 0815  metroNIDAZOLE (FLAGYL) IVPB 500 mg     500 mg 100 mL/hr over 60 Minutes Intravenous Every 8 hours 01/10/16 0731     01/10/16 0800  vancomycin (VANCOCIN) IVPB 750 mg/150 ml premix  Status:  Discontinued     750 mg 150 mL/hr over 60 Minutes Intravenous Every 12 hours 01/10/16 0748 01/11/16 1353   01/10/16 0800  ceFEPIme (MAXIPIME) 1 g in dextrose 5 % 50 mL IVPB  Status:  Discontinued     1 g 100 mL/hr over 30 Minutes Intravenous Every 8 hours 01/10/16 0748 01/11/16 1354   01/09/16 1930  cefTRIAXone (ROCEPHIN) 2 g in dextrose 5 % 50 mL IVPB  Status:  Discontinued     2 g 100 mL/hr over 30 Minutes Intravenous Every 24 hours 01/09/16 1924 01/10/16 0732   01/09/16 1930  metroNIDAZOLE (FLAGYL) IVPB 500 mg  Status:  Discontinued     500 mg 100 mL/hr over 60 Minutes Intravenous Every 8 hours 01/09/16 1924 01/10/16 0732   01/09/16 1715  piperacillin-tazobactam (ZOSYN) IVPB 3.375 g     3.375 g 100 mL/hr over 30 Minutes Intravenous  Once 01/09/16 1710 01/09/16 1843   01/09/16 1715  vancomycin (VANCOCIN) IVPB 1000 mg/200 mL premix     1,000 mg 200 mL/hr over 60 Minutes Intravenous  Once 01/09/16 1710 01/09/16 1946        Intake/Output Summary (Last 24 hours) at 01/13/16 1726 Last data filed at 01/13/16 0900  Gross per 24 hour  Intake      0 ml  Output    800 ml  Net   -800 ml   Filed Weights   01/11/16 0351 01/12/16 0412 01/13/16 0900  Weight: 101.288 kg (223 lb 4.8 oz) 102.967 kg (227 lb) 104.781 kg (231 lb)    Objective: Physical Exam: Filed Vitals:   01/13/16 0450 01/13/16 0900 01/13/16 1219 01/13/16 1300  BP: 142/66   142/63  Pulse: 71   65  Temp: 98.1 F (36.7 C)   97.9 F (36.6 C)  TempSrc: Oral   Oral  Resp: 18   16  Height:      Weight:   104.781 kg (231 lb)    SpO2: 97%  96% 99%    General: Appear in mild distress, no Rash; Oral Mucosa moist. Cardiovascular: S1 and S2 Present, no Murmur, no JVD Respiratory: Bilateral Air entry present and Clear to Auscultation, no Crackles, no wheezes Abdomen: Bowel Sound present, Soft and no tenderness Extremities: bilateral Pedal edema, no calf tenderness  Data Reviewed: CBC:  Recent Labs Lab 01/09/16 1642 01/10/16 0436 01/11/16 0420 01/12/16 0047 01/13/16 0910  WBC 28.3* 28.9* 27.5* 25.9* 22.0*  NEUTROABS 25.2* 25.2*  --  21.7* 18.4*  HGB 15.0 12.9* 12.1* 11.9* 12.0*  HCT 43.9 38.7* 36.9* 35.5* 36.6*  MCV 83.5 82.2 84.1 82.2 83.4  PLT 396 324 316 289 233   Basic Metabolic Panel:  Recent Labs Lab 01/09/16 1642 01/10/16 0436 01/11/16 0420 01/12/16 0047 01/13/16 0910  NA 135 132* 132* 134* 134*  K 4.2 3.5 5.0 3.7 3.8  CL 92* 97* 100* 103 103  CO2 _0 21*  GLUCOSE 328* 352* 193* 168* 233*  BUN 17 22* 30* 32* 30*  CREATININE 1.14 1.45* 2.16* 2.34* 2.17*  CALCIUM 9.4 8.1* 8.1* 7.9* 8.0*  MG  --   --   --  1.7  --   PHOS  --   --   --   --  3.4   GFR: Estimated Creatinine Clearance: 40.8 mL/min (by C-G formula based on Cr of 2.17). Liver Function Tests:  Recent Labs Lab 01/09/16 1642 01/12/16 0047 01/13/16 0910  AST 16 13*  --   ALT 11* 7*  --   ALKPHOS 142* 91  --   BILITOT 1.4* 0.4  --   PROT 8.6* 6.0*  --   ALBUMIN 2.6* 1.7* 1.7*   No results for input(s): LIPASE, AMYLASE in the last 168 hours. No results for input(s): AMMONIA in the last 168 hours. Coagulation Profile:  Recent Labs Lab 01/09/16 1955 01/12/16 0047  INR 1.36 1.63*   Cardiac Enzymes:  Recent Labs Lab 01/11/16 0420  CKTOTAL 61   BNP (last 3 results) No results for input(s): PROBNP in the last 8760 hours. HbA1C: No results for input(s): HGBA1C in the last 72 hours. CBG:  Recent Labs Lab 01/12/16 1710 01/12/16 2139 01/13/16 0622 01/13/16 1207 01/13/16 1618    GLUCAP 266* 308* 222* 163* 119*   Lipid Profile: No results for input(s): CHOL, HDL, LDLCALC, TRIG, CHOLHDL, LDLDIRECT in the last 72 hours. Thyroid Function Tests: No results for input(s): TSH, T4TOTAL, FREET4, T3FREE, THYROIDAB in the last 72 hours. Anemia Panel: No results for input(s): VITAMINB12, FOLATE, FERRITIN, TIBC, IRON, RETICCTPCT in the last 72 hours. Urine analysis:    Component Value Date/Time   COLORURINE YELLOW 01/12/2016 [place] 01/12/2016 1734   LABSPEC 1.018 01/12/2016 1734   PHURINE 5.5 01/12/2016 1734   GLUCOSEU >1000* 01/12/2016 1734   HGBUR NEGATIVE 01/12/2016 1734   BILIRUBINUR NEGATIVE 01/12/2016 1734   KETONESUR NEGATIVE 01/12/2016 1734   PROTEINUR NEGATIVE 01/12/2016 1734   UROBILINOGEN 0.2 07/28/2014 1739   NITRITE NEGATIVE 01/12/2016 1734   LEUKOCYTESUR NEGATIVE 01/12/2016 1734   Sepsis Labs: _1 (procalcitonin:4,lacticidven:4)  ) Recent Results (from the past 240 hour(s))  Culture, blood (Routine x 2)     Status: None (Preliminary result)   Collection Time: 01/09/16  4:42 PM  Result Value Ref Range Status   Specimen Description BLOOD RIGHT ANTECUBITAL  Final   Special Requests BOTTLES DRAWN AEROBIC AND ANAEROBIC 5CC  Final   Culture NO GROWTH 4 DAYS  Final   Report Status PENDING  Incomplete  Culture, blood (Routine x 2)     Status: None (Preliminary result)   Collection Time: 01/09/16  8:15 PM  Result Value Ref Range Status   Specimen Description BLOOD RIGHT HAND  Final   Special Requests BOTTLES DRAWN AEROBIC AND ANAEROBIC 5CC  Final   Culture NO GROWTH 4 DAYS  Final   Report Status PENDING  Incomplete  Urine culture     Status: Abnormal   Collection Time: 01/10/16  8:44 AM  Result Value Ref Range Status   Specimen Description URINE, CLEAN CATCH  Final   Special Requests NONE  Final   Culture 2,000 COLONIES/mL INSIGNIFICANT GROWTH (A)  Final   Report Status 01/11/2016 FINAL  Final  Surgical pcr screen      Status: Abnormal   Collection Time: 01/12/16  5:59 PM  Result Value Ref Range Status  MRSA, PCR NEGATIVE NEGATIVE Final   Staphylococcus aureus POSITIVE (A) NEGATIVE Final    Comment:        The Xpert SA Assay (FDA approved for NASAL specimens in patients over 21 years of age), is one component of a comprehensive surveillance program.  Test performance has been validated by [facility] for patients greater than or equal to 1 year old. It is not intended to diagnose infection nor to guide or monitor treatment.       Studies: No results found.   Scheduled Meds: . amiodarone  200 mg Oral Daily  . atorvastatin  10 mg Oral q morning - 10a  . carvedilol  6.25 mg Oral BID WC  . ceFEPime (MAXIPIME) IV  2 g Intravenous Q24H  . Chlorhexidine Gluconate Cloth  6 each Topical Daily  . feeding supplement (PRO-STAT SUGAR FREE 64)  30 mL Oral BID  . insulin aspart  0-9 Units Subcutaneous Q4H  . insulin glargine  15 Units Subcutaneous QHS  . metronidazole  500 mg Intravenous Q8H  . multivitamin with minerals  1 tablet Oral Daily  . mupirocin ointment  1 application Nasal BID  . vancomycin  1,000 mg Intravenous Q24H   Continuous Infusions: . sodium chloride 100 mL/hr at 01/13/16 0114   PRN Meds: acetaminophen **OR** acetaminophen, HYDROcodone-acetaminophen, morphine injection, ondansetron **OR** ondansetron (ZOFRAN) IV, polyethylene glycol, sodium chloride flush  Time spent: 30 minutes  Author: Pranav Patel, MD [facility] Pager: [phone] 01/13/2016 5:26 PM  If 7PM-7AM, please contact night-coverage at [email or web address], password [email or web address]  

## 2016-01-14 LAB — CULTURE, BLOOD (ROUTINE X 2)
CULTURE: NO GROWTH
CULTURE: NO GROWTH

## 2016-01-14 LAB — HEPARIN LEVEL (UNFRACTIONATED)

## 2016-01-14 LAB — CBC
HCT: 36.3 % — ABNORMAL LOW (ref 39.0–52.0)
Hemoglobin: 11.6 g/dL — ABNORMAL LOW (ref 13.0–17.0)
MCH: 27 pg (ref 26.0–34.0)
MCHC: 32 g/dL (ref 30.0–36.0)
MCV: 84.6 fL (ref 78.0–100.0)
PLATELETS: 350 10*3/uL (ref 150–400)
RBC: 4.29 MIL/uL (ref 4.22–5.81)
RDW: 14.4 % (ref 11.5–15.5)
WBC: 16.4 10*3/uL — AB (ref 4.0–10.5)

## 2016-01-14 LAB — GLUCOSE, CAPILLARY
GLUCOSE-CAPILLARY: 213 mg/dL — AB (ref 65–99)
GLUCOSE-CAPILLARY: 292 mg/dL — AB (ref 65–99)
GLUCOSE-CAPILLARY: 419 mg/dL — AB (ref 65–99)
Glucose-Capillary: 436 mg/dL — ABNORMAL HIGH (ref 65–99)
Glucose-Capillary: 286 mg/dL — ABNORMAL HIGH (ref 65–99)

## 2016-01-14 LAB — RENAL FUNCTION PANEL
ALBUMIN: 1.5 g/dL — AB (ref 3.5–5.0)
ANION GAP: 9 (ref 5–15)
BUN: 25 mg/dL — ABNORMAL HIGH (ref 6–20)
CHLORIDE: 105 mmol/L (ref 101–111)
CO2: 22 mmol/L (ref 22–32)
Calcium: 7.9 mg/dL — ABNORMAL LOW (ref 8.9–10.3)
Creatinine, Ser: 2.07 mg/dL — ABNORMAL HIGH (ref 0.61–1.24)
GFR calc Af Amer: 36 mL/min — ABNORMAL LOW (ref >60)
GFR, EST NON AFRICAN AMERICAN: 31 mL/min — AB (ref >60)
Glucose, Bld: 238 mg/dL — ABNORMAL HIGH (ref 65–99)
POTASSIUM: 3.9 mmol/L (ref 3.5–5.1)
Phosphorus: 3.3 mg/dL (ref 2.5–4.6)
SODIUM: 136 mmol/L (ref 135–145)

## 2016-01-14 LAB — CREATININE, URINE, RANDOM: CREATININE, URINE: 65.06 mg/dL

## 2016-01-14 LAB — SODIUM, URINE, RANDOM: SODIUM UR: 19 mmol/L

## 2016-01-14 MED ORDER — APIXABAN 5 MG PO TABS
5.0000 mg | ORAL_TABLET | Freq: Two times a day (BID) | ORAL | Status: DC
  Administered 2016-01-14 – 2016-01-17 (×6): 5 mg via ORAL
  Filled 2016-01-14 (×6): qty 1

## 2016-01-14 MED ORDER — SODIUM CHLORIDE 0.9 % IV SOLN
INTRAVENOUS | Status: DC
  Administered 2016-01-14: 11:00:00 via INTRAVENOUS

## 2016-01-14 MED ORDER — INSULIN ASPART 100 UNIT/ML ~~LOC~~ SOLN
0.0000 [IU] | Freq: Three times a day (TID) | SUBCUTANEOUS | Status: DC
  Administered 2016-01-14: 15 [IU] via SUBCUTANEOUS
  Administered 2016-01-15: 3 [IU] via SUBCUTANEOUS
  Administered 2016-01-15: 11 [IU] via SUBCUTANEOUS
  Administered 2016-01-15: 5 [IU] via SUBCUTANEOUS
  Administered 2016-01-16 – 2016-01-17 (×3): 2 [IU] via SUBCUTANEOUS
  Administered 2016-01-17: 5 [IU] via SUBCUTANEOUS

## 2016-01-14 MED ORDER — INSULIN ASPART 100 UNIT/ML ~~LOC~~ SOLN
0.0000 [IU] | Freq: Every day | SUBCUTANEOUS | Status: DC
  Administered 2016-01-14: 3 [IU] via SUBCUTANEOUS

## 2016-01-14 MED ORDER — INSULIN GLARGINE 100 UNIT/ML ~~LOC~~ SOLN
30.0000 [IU] | Freq: Every day | SUBCUTANEOUS | Status: DC
  Administered 2016-01-14 – 2016-01-15 (×2): 30 [IU] via SUBCUTANEOUS
  Filled 2016-01-14 (×3): qty 0.3

## 2016-01-14 MED ORDER — SODIUM CHLORIDE 0.9% FLUSH
10.0000 mL | INTRAVENOUS | Status: DC | PRN
  Administered 2016-01-14: 20 mL
  Administered 2016-01-17: 10 mL
  Filled 2016-01-14 (×2): qty 40

## 2016-01-14 NOTE — Progress Notes (Addendum)
Dinner bg 436. Paged MD. Gave 15 units per [facility] while waiting reply. Scale was already increased for dinner and Lantus is scheduled for night. Paged MD again to ask if lab draw is necessary at this time - insulin already given.  Per MD skip lab draw and recheck fingerstick in 2 hours. Reported to NA and will report to night nurse

## 2016-01-14 NOTE — Progress Notes (Signed)
Rehab Admissions Coordinator Note:  Patient was screened by Retta Diones for appropriateness for an Inpatient Acute Rehab Consult.  At this time, we are recommending Inpatient Rehab consult In addition, please order an OT consult.  We will need insurance authorization.  Retta Diones 01/14/2016, 4:48 PM  I can be reached at [phone].

## 2016-01-14 NOTE — Evaluation (Signed)
Physical Therapy Evaluation Patient Details Name: Thomas Wright MRN: [id number] DOB: [birth year] Today's Date: 01/14/2016   History of Present Illness  70 yo admitted for Rt BKA. PMHx: Afib, DM, HTN, AVR, CHF  Clinical Impression  Pt pleasant and states desire to have prosthesis in future. Pt educated for positioning, HEP, transfers, progression and function. Pt without caregiver assist at home, decreased bil UE strength, impaired function and transfers who will benefit from acute therapy as well as post-acute rehab to maximize function and mobility to decrease burden of care.     Follow Up Recommendations CIR;Supervision for mobility/OOB    Equipment Recommendations  Rolling walker with 5" wheels;Wheelchair cushion (measurements PT);Wheelchair (measurements PT);Other (comment) (tub bench)    Recommendations for Other Services OT consult     Precautions / Restrictions Precautions Precautions: Fall      Mobility  Bed Mobility Overal bed mobility: Needs Assistance Bed Mobility: Rolling;Sidelying to Sit Rolling: Supervision Sidelying to sit: Supervision       General bed mobility comments: cues for sequence with use of rail   Transfers Overall transfer level: Needs assistance   Transfers: Sit to/from Stand Sit to Stand: Min assist;+2 safety/equipment;From elevated surface         General transfer comment: cues for hand placement and sequence with Left knee blocked. Pt able to fully extend knee with increased time but difficulty with attempting to achieve fully upright and unable to get bil elbows extended in standing. Returned to sitting. Transitioned to squat pivot to left with min assist to perform pivot with cues for sequence and hand placement  Ambulation/Gait                Stairs            Wheelchair Mobility    Modified Rankin (Stroke Patients Only)       Balance Overall balance assessment: Needs assistance   Sitting balance-Leahy Scale:  Good       Standing balance-Leahy Scale: Poor                               Pertinent Vitals/Pain Pain Assessment: No/denies pain    Home Living Family/patient expects to be discharged to:: Private residence Living Arrangements: Alone Available Help at Discharge: Available PRN/intermittently;Friend(s) Type of Home: Mobile home Home Access: Stairs to enter   Entrance Stairs-Number of Steps: 9 Home Layout: One level Home Equipment: Cane - single point      Prior Function Level of Independence: Independent               Hand Dominance        Extremity/Trunk Assessment   Upper Extremity Assessment: Generalized weakness           Lower Extremity Assessment: RLE deficits/detail RLE Deficits / Details: pt with block still active and unable to fully feel leg or perform quad activation    Cervical / Trunk Assessment: Normal  Communication   Communication: No difficulties  Cognition Arousal/Alertness: Awake/alert Behavior During Therapy: WFL for tasks assessed/performed Overall Cognitive Status: Within Functional Limits for tasks assessed                      General Comments      Exercises Amputee Exercises Hip Extension: AROM;Sidelying;Right;10 reps Hip ABduction/ADduction: Right;10 reps;Supine;AAROM Knee Extension: AAROM;Right;10 reps;Seated      Assessment/Plan    PT Assessment Patient needs continued PT services  PT  Diagnosis Difficulty walking;Acute pain   PT Problem List Decreased strength;Decreased activity tolerance;Decreased balance;Decreased mobility;Decreased knowledge of use of DME;Obesity  PT Treatment Interventions DME instruction;Functional mobility training;Therapeutic activities;Therapeutic exercise;Balance training;Patient/family education;Wheelchair mobility training   PT Goals (Current goals can be found in the Care Plan section) Acute Rehab PT Goals Patient Stated Goal: return home PT Goal Formulation: With  patient Time For Goal Achievement: 01/28/16 Potential to Achieve Goals: Fair    Frequency Min 4X/week   Barriers to discharge Decreased caregiver support      Co-evaluation               End of Session Equipment Utilized During Treatment: Gait belt Activity Tolerance: Patient tolerated treatment well Patient left: in chair;with call bell/phone within reach;with chair alarm set;with nursing/sitter in room Nurse Communication: Mobility status;Precautions         Time: [id number] PT Time Calculation (min) (ACUTE ONLY): 23 min   Charges:   PT Evaluation $PT Eval Moderate Complexity: 1 Procedure PT Treatments $Therapeutic Activity: 8-22 mins   PT G CodesMelford Aase 01/14/2016, 11:25 AM Elwyn Reach, [place]

## 2016-01-14 NOTE — Progress Notes (Signed)
ANTICOAGULATION CONSULT NOTE - Follow Up Consult  Pharmacy Consult for Heparin transition to Apixaban Indication: atrial fibrillation  Allergies  Allergen Reactions  . Amoxicill-Clarithro-Omeprazole Diarrhea and Nausea And Vomiting   Patient Measurements: Height: 6' (182.9 cm) Weight: 231 lb (104.781 kg) IBW/kg (Calculated) : 77.6  Vital Signs: Temp: 98.4 F (36.9 C) (04/29 1700) Temp Source: Oral (04/29 1700) BP: 136/65 mmHg (04/29 1700) Pulse Rate: 75 (04/29 1700)  Labs:  Recent Labs  01/12/16 0047 01/12/16 0500 01/13/16 0910 01/13/16 1100 01/14/16 0700 01/14/16 1020  HGB 11.9*  --  12.0*  --  11.6*  --   HCT 35.5*  --  36.6*  --  36.3*  --   PLT 289  --  325  --  350  --   LABPROT 19.3*  --   --   --   --   --   INR 1.63*  --   --   --   --   --   HEPARINUNFRC 0.40 0.30  --  <0.10*  --  <0.10*  CREATININE 2.34*  --  2.17*  --  2.07*  --     Estimated Creatinine Clearance: 42.8 mL/min (by C-G formula based on Cr of 2.07).   Assessment: Pharmacy is consulted to transition patient from heparin to apixaban for atrial fibrillation.   Goal of Therapy:  Monitor platelets by anticoagulation protocol: Yes   Plan:  Stop heparin Start apixaban 5mg  PO BID Monitor s/sx of bleeding   Andrey Cota. Diona Foley, PharmD, [facility] Clinical Pharmacist Pager [phone]  01/14/2016,5:58 PM

## 2016-01-14 NOTE — Care Management Note (Signed)
Case Management Note  Patient Details  Name: Thomas Wright MRN: [id number] Date of Birth: 12/25/1945  Subjective/Objective: 70 yo M s/p R BKA               Action/Plan: PT is recommending CIR   Expected Discharge Date:                  Expected Discharge Plan:  [place]  In-House Referral:     Discharge planning Services  CM Consult  Post Acute Care Choice:    Choice offered to:     DME Arranged:    DME Agency:     HH Arranged:    [place] Agency:     Status of Service:  In process, will continue to follow  Medicare Important Message Given:  Yes Date Medicare IM Given:    Medicare IM give by:    Date Additional Medicare IM Given:    Additional Medicare Important Message give by:     If discussed at [place] of Stay Meetings, dates discussed:    Additional Comments: discharge plan is CIR. Will continue to f/u to assist with d/c needs if unable to go to CIR  Norina Buzzard, RN 01/14/2016, 4:52 PM

## 2016-01-14 NOTE — Progress Notes (Signed)
[facility] Progress Note  Patient: Thomas Wright ALP:[id number]   PCP: [name], MD DOB: 18-Jan-1946   DOA: 01/09/2016   DOS: 01/14/2016   Date of Service: the patient was seen and examined on 01/14/2016 Outpatient Specialists: Tana Conch cardiology Pulmonary Praveen mannam  Subjective: The patient tolerated the procedure well. The pain has been well controlled. Denies having any nausea or vomiting or diarrhea after the procedure. Nutrition: Tolerating oral diet  Brief hospital course: Patient was admitted on 01/09/2016, with complaint of fever chills and right foot discoloration, was found to have right foot gangrene with sepsis. Was started on broad-spectrum antibiotics, orthopedic was consulted and recommended transtibial amputation which is scheduled on 01/13/2016. Patient has chronic right plantar ulcer and has been following up with the wound care center but he stopped going there since last 2 weeks prior to admission. Patient also has mild worsening of his chronic kidney disease, nephrology was also consulted. Renal function appears to be stabilizing. 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 Currently further plan is continue with the current plan of postoperative recovery.  Assessment and Plan: 1. Diabetic foot infection ([place]) Met sepsis criteria on admission with fever, leukocytosis. Currently on vancomycin and cefepime and Flagyl. Negative blood cultures. Orthopedic has been consulted and the patient is scheduled for transfer to be an amputation on 01/13/2016. ABI shows within normal limits. Persistent leukocytosis but improving. Patient initially on vancomycin and cefepime as well as Flagyl. Antibiotic will be changed to clindamycin. Will finish a total 10 day treatment course  2. Acute kidney injury. Most likely in the setting of sepsis. CK normal. Continue IV hydration. Monitor renal function. Discontinue Lasix. Appreciate input from  nephrology  3. Chronic combined CHF. Recent echocardiogram in August 2016 shows normal ejection fraction 50-55% with grade 1 diastolic dysfunction. Continue aspirin and statin and beta blocker. Monitor for volume overload  4.Atrial fibrillation CHA2DS2-VASc Score 4 We'll continue amiodarone and Coreg as well as aspirin  On Eliquis which was on hold for surgery, pt was on heparin infusion therapeutic dose. We'll start Eliquis today, stop heparin  5. Uncontrolled insulin-dependent type 2 diabetes mellitus. Hemoglobin A1c 12.6 on April 24. Blood sugar mildly elevated. Pt without insulin for last 2-3 months prior to admission. Resuming sliding scale as well as Lantus  Activity: physical therapy consulted Bowel regimen: last BM 01/12/2016 Diet: Cardiac and diabetic diet DVT Prophylaxis: on therapeutic anticoagulation.  Advance goals of care discussion: Full code  Family Communication: no family was present at bedside, at the time of interview.   Disposition:  Expected discharge date: 01/16/2016 Barriers to safe discharge: Postoperative course  Consultants: Orthopedics, nephrology Procedures: None  Antibiotics: Anti-infectives    Start     Dose/Rate Route Frequency Ordered Stop   01/13/16 1500  ceFEPIme (MAXIPIME) 2 g in dextrose 5 % 50 mL IVPB     2 g 100 mL/hr over 30 Minutes Intravenous Every 24 hours 01/12/16 1345     01/13/16 1500  vancomycin (VANCOCIN) IVPB 1000 mg/200 mL premix     1,000 mg 200 mL/hr over 60 Minutes Intravenous Every 24 hours 01/13/16 1330     01/12/16 1400  ceFEPIme (MAXIPIME) 1 g in dextrose 5 % 50 mL IVPB     1 g 100 mL/hr over 30 Minutes Intravenous  Once 01/12/16 1345 01/12/16 1516   01/12/16 0500  vancomycin (VANCOCIN) 1,250 mg in sodium chloride 0.9 % 250 mL IVPB  Status:  Discontinued     1,250 mg  166.7 mL/hr over 90 Minutes Intravenous Every 24 hours 01/11/16 1353 01/12/16 0746   01/11/16 1800  ceFEPIme (MAXIPIME) 1 g in dextrose 5 % 50 mL  IVPB  Status:  Discontinued     1 g 100 mL/hr over 30 Minutes Intravenous Every 12 hours 01/11/16 1354 01/12/16 1345   01/10/16 0815  metroNIDAZOLE (FLAGYL) IVPB 500 mg     500 mg 100 mL/hr over 60 Minutes Intravenous Every 8 hours 01/10/16 0731     01/10/16 0800  vancomycin (VANCOCIN) IVPB 750 mg/150 ml premix  Status:  Discontinued     750 mg 150 mL/hr over 60 Minutes Intravenous Every 12 hours 01/10/16 0748 01/11/16 1353   01/10/16 0800  ceFEPIme (MAXIPIME) 1 g in dextrose 5 % 50 mL IVPB  Status:  Discontinued     1 g 100 mL/hr over 30 Minutes Intravenous Every 8 hours 01/10/16 0748 01/11/16 1354   01/09/16 1930  cefTRIAXone (ROCEPHIN) 2 g in dextrose 5 % 50 mL IVPB  Status:  Discontinued     2 g 100 mL/hr over 30 Minutes Intravenous Every 24 hours 01/09/16 1924 01/10/16 0732   01/09/16 1930  metroNIDAZOLE (FLAGYL) IVPB 500 mg  Status:  Discontinued     500 mg 100 mL/hr over 60 Minutes Intravenous Every 8 hours 01/09/16 1924 01/10/16 0732   01/09/16 1715  piperacillin-tazobactam (ZOSYN) IVPB 3.375 g     3.375 g 100 mL/hr over 30 Minutes Intravenous  Once 01/09/16 1710 01/09/16 1843   01/09/16 1715  vancomycin (VANCOCIN) IVPB 1000 mg/200 mL premix     1,000 mg 200 mL/hr over 60 Minutes Intravenous  Once 01/09/16 1710 01/09/16 1946      Intake/Output Summary (Last 24 hours) at 01/14/16 1734 Last data filed at 01/14/16 0930  Gross per 24 hour  Intake    717 ml  Output   2050 ml  Net  -1333 ml   Filed Weights   01/12/16 0412 01/13/16 0900 01/14/16 0700  Weight: 102.967 kg (227 lb) 104.781 kg (231 lb) 104.781 kg (231 lb)   Objective: Physical Exam: Filed Vitals:   01/14/16 0132 01/14/16 0513 01/14/16 0700 01/14/16 1700  BP: 125/54 147/64  136/65  Pulse: 69 70  75  Temp: 98.2 F (36.8 C) 98.4 F (36.9 C)  98.4 F (36.9 C)  TempSrc: Oral Oral  Oral  Resp: 18 18    Height:      Weight:   104.781 kg (231 lb)   SpO2: 95% 94%  95%    General: Appear in mild distress, no  Rash; Oral Mucosa moist. Cardiovascular: S1 and S2 Present, no Murmur, no JVD Respiratory: Bilateral Air entry present and Clear to Auscultation, no Crackles, no wheezes Abdomen: Bowel Sound present, Soft and no tenderness Extremities: RIGHT bka  Data Reviewed: CBC:  Recent Labs Lab 01/09/16 1642 01/10/16 0436 01/11/16 0420 01/12/16 0047 01/13/16 0910 01/14/16 0700  WBC 28.3* 28.9* 27.5* 25.9* 22.0* 16.4*  NEUTROABS 25.2* 25.2*  --  21.7* 18.4*  --   HGB 15.0 12.9* 12.1* 11.9* 12.0* 11.6*  HCT 43.9 38.7* 36.9* 35.5* 36.6* 36.3*  MCV 83.5 82.2 84.1 82.2 83.4 84.6  PLT 396 324 316 289 325 161   Basic Metabolic Panel:  Recent Labs Lab 01/10/16 0436 01/11/16 0420 01/12/16 0047 01/13/16 0910 01/14/16 0700  NA 132* 132* 134* 134* 136  K 3.5 5.0 3.7 3.8 3.9  CL 97* 100* 103 103 105  CO2 22 24 22  21* 22  GLUCOSE 352*  193* 168* 233* 238*  BUN 22* 30* 32* 30* 25*  CREATININE 1.45* 2.16* 2.34* 2.17* 2.07*  CALCIUM 8.1* 8.1* 7.9* 8.0* 7.9*  MG  --   --  1.7  --   --   PHOS  --   --   --  3.4 3.3   GFR: Estimated Creatinine Clearance: 42.8 mL/min (by C-G formula based on Cr of 2.07). Liver Function Tests:  Recent Labs Lab 01/09/16 1642 01/12/16 0047 01/13/16 0910 01/14/16 0700  AST 16 13*  --   --   ALT 11* 7*  --   --   ALKPHOS 142* 91  --   --   BILITOT 1.4* 0.4  --   --   PROT 8.6* 6.0*  --   --   ALBUMIN 2.6* 1.7* 1.7* 1.5*   No results for input(s): LIPASE, AMYLASE in the last 168 hours. No results for input(s): AMMONIA in the last 168 hours. Coagulation Profile:  Recent Labs Lab 01/09/16 1955 01/12/16 0047  INR 1.36 1.63*   Cardiac Enzymes:  Recent Labs Lab 01/11/16 0420  CKTOTAL 61   BNP (last 3 results) No results for input(s): PROBNP in the last 8760 hours. HbA1C: No results for input(s): HGBA1C in the last 72 hours. CBG:  Recent Labs Lab 01/13/16 2007 01/13/16 2243 01/13/16 2317 01/14/16 0629 01/14/16 1118  GLUCAP 126* 116* 120*  213* 292*   Lipid Profile: No results for input(s): CHOL, HDL, LDLCALC, TRIG, CHOLHDL, LDLDIRECT in the last 72 hours. Thyroid Function Tests: No results for input(s): TSH, T4TOTAL, FREET4, T3FREE, THYROIDAB in the last 72 hours. Anemia Panel: No results for input(s): VITAMINB12, FOLATE, FERRITIN, TIBC, IRON, RETICCTPCT in the last 72 hours. Urine analysis:    Component Value Date/Time   COLORURINE YELLOW 01/12/2016 [place] 01/12/2016 1734   LABSPEC 1.018 01/12/2016 1734   PHURINE 5.5 01/12/2016 1734   GLUCOSEU >1000* 01/12/2016 1734   HGBUR NEGATIVE 01/12/2016 1734   BILIRUBINUR NEGATIVE 01/12/2016 1734   KETONESUR NEGATIVE 01/12/2016 1734   PROTEINUR NEGATIVE 01/12/2016 1734   UROBILINOGEN 0.2 07/28/2014 1739   NITRITE NEGATIVE 01/12/2016 1734   LEUKOCYTESUR NEGATIVE 01/12/2016 1734   Sepsis Labs: @LABRCNTIP (procalcitonin:4,lacticidven:4)  ) Recent Results (from the past 240 hour(s))  Culture, blood (Routine x 2)     Status: None   Collection Time: 01/09/16  4:42 PM  Result Value Ref Range Status   Specimen Description BLOOD RIGHT ANTECUBITAL  Final   Special Requests BOTTLES DRAWN AEROBIC AND ANAEROBIC 5CC  Final   Culture NO GROWTH 5 DAYS  Final   Report Status 01/14/2016 FINAL  Final  Culture, blood (Routine x 2)     Status: None   Collection Time: 01/09/16  8:15 PM  Result Value Ref Range Status   Specimen Description BLOOD RIGHT HAND  Final   Special Requests BOTTLES DRAWN AEROBIC AND ANAEROBIC 5CC  Final   Culture NO GROWTH 5 DAYS  Final   Report Status 01/14/2016 FINAL  Final  Urine culture     Status: Abnormal   Collection Time: 01/10/16  8:44 AM  Result Value Ref Range Status   Specimen Description URINE, CLEAN CATCH  Final   Special Requests NONE  Final   Culture 2,000 COLONIES/mL INSIGNIFICANT GROWTH (A)  Final   Report Status 01/11/2016 FINAL  Final  Surgical pcr screen     Status: Abnormal   Collection Time: 01/12/16  5:59 PM    Result Value Ref Range Status  MRSA, PCR NEGATIVE NEGATIVE Final   Staphylococcus aureus POSITIVE (A) NEGATIVE Final    Comment:        The Xpert SA Assay (FDA approved for NASAL specimens in patients over 79 years of age), is one component of a comprehensive surveillance program.  Test performance has been validated by [facility] for patients greater than or equal to 53 year old. It is not intended to diagnose infection nor to guide or monitor treatment.       Studies: No results found.   Scheduled Meds: . amiodarone  200 mg Oral Daily  . atorvastatin  10 mg Oral q morning - 10a  . carvedilol  6.25 mg Oral BID WC  . ceFEPime (MAXIPIME) IV  2 g Intravenous Q24H  . Chlorhexidine Gluconate Cloth  6 each Topical Daily  . feeding supplement (PRO-STAT SUGAR FREE 64)  30 mL Oral BID  . insulin aspart  0-9 Units Subcutaneous Q4H  . insulin glargine  15 Units Subcutaneous QHS  . metronidazole  500 mg Intravenous Q8H  . multivitamin with minerals  1 tablet Oral Daily  . mupirocin ointment  1 application Nasal BID  . vancomycin  1,000 mg Intravenous Q24H   Continuous Infusions: . sodium chloride 10 mL/hr at 01/14/16 0140  . sodium chloride 100 mL/hr at 01/14/16 1128  . heparin 2,150 Units/hr (01/14/16 1337)   PRN Meds: acetaminophen **OR** acetaminophen, HYDROcodone-acetaminophen, HYDROmorphone (DILAUDID) injection, methocarbamol **OR** methocarbamol (ROBAXIN)  IV, metoCLOPramide **OR** metoCLOPramide (REGLAN) injection, morphine injection, ondansetron **OR** ondansetron (ZOFRAN) IV, oxyCODONE, polyethylene glycol, sodium chloride flush, sodium chloride flush  Time spent: 30 minutes  Author: Berle Mull, MD [facility] Pager: [phone] 01/14/2016 5:34 PM  If 7PM-7AM, please contact night-coverage at [email or web address], password [facility]

## 2016-01-14 NOTE — Progress Notes (Signed)
VAC with good seal and suction Pain well controlled Up with PT NWB Stable from ortho stand point  N. Eduard Roux, MD [place] 8:55 AM

## 2016-01-14 NOTE — Progress Notes (Signed)
ANTICOAGULATION CONSULT NOTE - Follow Up Consult  Pharmacy Consult for Heparin  Indication: atrial fibrillation  Allergies  Allergen Reactions  . Amoxicill-Clarithro-Omeprazole Diarrhea and Nausea And Vomiting   Patient Measurements: Height: 6' (182.9 cm) Weight: 231 lb (104.781 kg) IBW/kg (Calculated) : 77.6  Vital Signs: Temp: 98.4 F (36.9 C) (04/29 0513) Temp Source: Oral (04/29 0513) BP: 147/64 mmHg (04/29 0513) Pulse Rate: 70 (04/29 0513)  Labs:  Recent Labs  01/12/16 0047 01/12/16 0500 01/13/16 0910 01/13/16 1100 01/14/16 0700 01/14/16 1020  HGB 11.9*  --  12.0*  --  11.6*  --   HCT 35.5*  --  36.6*  --  36.3*  --   PLT 289  --  325  --  350  --   LABPROT 19.3*  --   --   --   --   --   INR 1.63*  --   --   --   --   --   HEPARINUNFRC 0.40 0.30  --  <0.10*  --  <0.10*  CREATININE 2.34*  --  2.17*  --  2.07*  --     Estimated Creatinine Clearance: 42.8 mL/min (by C-G formula based on Cr of 2.07).   Assessment: Was on heparin while holding Apixaban for surgery, S/p transtibial amputation done last night. Heparin IV drip re-started heparin drip last night per Dr. Jess Barters orders. We restarted the heparin drip at previously therapeutic rate of 2150 units/hr.  The heparin level this AM was low at <0.1.  The RN now reports that the IV site was at a positional site and was constantly beeping (alarming) on & off thus heparin likely not infusing properly. Thus HL likley of <0.1 doesn't accurately reflect rate of 2150 units/hr.  RN changed IV to different site at ~ 12noon and RN reports heparin infusing without interruption now. No bleeding noted per RN.  Will keep heparin at current rate and recheck HL in ~ 6-8hrs HL is being drawn by phlebotomy (midline IV, not a central line) and HL was drawn from arm opposite of heparin infusion arm.  Goal of Therapy:  Heparin level 0.3-0.7 units/ml Monitor platelets by anticoagulation protocol: Yes   Plan:  Continue IV heparin at  2150 units/hr Check Heparin level in 6-8 hrs.   Nicole Cella, RPh Clinical Pharmacist Pager: [phone] 01/14/2016,12:25 PM

## 2016-01-14 NOTE — Progress Notes (Signed)
Patient ID: JOHANSEN GARY, male   DOB: 1946-01-17, 70 y.o.   MRN: [id number] S:Feels better O:BP 147/64 mmHg  Pulse 70  Temp(Src) 98.4 F (36.9 C) (Oral)  Resp 18  Ht 6' (1.829 m)  Wt 104.781 kg (231 lb)  BMI 31.32 kg/m2  SpO2 94%  Intake/Output Summary (Last 24 hours) at 01/14/16 0833 Last data filed at 01/14/16 0700  Gross per 24 hour  Intake    237 ml  Output   1825 ml  Net  -1588 ml   Intake/Output: I/O last 3 completed shifts: In: 58 [P.O.:237] Out: 2250 [Urine:2150; Blood:100]  Intake/Output this shift:    Weight change:  Gen:WD WN WM in NAD CVS:no rub Resp:cta [id number] Ext:s/p R BKA with wound vac in place, no edema   Recent Labs Lab 01/09/16 1642 01/10/16 0436 01/11/16 0420 01/12/16 0047 01/13/16 0910 01/14/16 0700  NA 135 132* 132* 134* 134* 136  K 4.2 3.5 5.0 3.7 3.8 3.9  CL 92* 97* 100* 103 103 105  CO2 23 22 24 22  21* 22  GLUCOSE 328* 352* 193* 168* 233* 238*  BUN 17 22* 30* 32* 30* 25*  CREATININE 1.14 1.45* 2.16* 2.34* 2.17* 2.07*  ALBUMIN 2.6*  --   --  1.7* 1.7* 1.5*  CALCIUM 9.4 8.1* 8.1* 7.9* 8.0* 7.9*  PHOS  --   --   --   --  3.4 3.3  AST 16  --   --  13*  --   --   ALT 11*  --   --  7*  --   --    Liver Function Tests:  Recent Labs Lab 01/09/16 1642 01/12/16 0047 01/13/16 0910  AST 16 13*  --   ALT 11* 7*  --   ALKPHOS 142* 91  --   BILITOT 1.4* 0.4  --   PROT 8.6* 6.0*  --   ALBUMIN 2.6* 1.7* 1.7*   No results for input(s): LIPASE, AMYLASE in the last 168 hours. No results for input(s): AMMONIA in the last 168 hours. CBC:  Recent Labs Lab 01/10/16 0436 01/11/16 0420 01/12/16 0047 01/13/16 0910 01/14/16 0700  WBC 28.9* 27.5* 25.9* 22.0* 16.4*  NEUTROABS 25.2*  --  21.7* 18.4*  --   HGB 12.9* 12.1* 11.9* 12.0* 11.6*  HCT 38.7* 36.9* 35.5* 36.6* 36.3*  MCV 82.2 84.1 82.2 83.4 84.6  PLT 324 316 289 325 350   Cardiac Enzymes:  Recent Labs Lab 01/11/16 0420  CKTOTAL 61   CBG:  Recent Labs Lab  01/13/16 1750 01/13/16 2007 01/13/16 2243 01/13/16 2317 01/14/16 0629  GLUCAP 155* 126* 116* 120* 213*    Iron Studies: No results for input(s): IRON, TIBC, TRANSFERRIN, FERRITIN in the last 72 hours. Studies/Results: No results found. Marland Kitchen amiodarone  200 mg Oral Daily  . atorvastatin  10 mg Oral q morning - 10a  . carvedilol  6.25 mg Oral BID WC  . ceFEPime (MAXIPIME) IV  2 g Intravenous Q24H  . Chlorhexidine Gluconate Cloth  6 each Topical Daily  . feeding supplement (PRO-STAT SUGAR FREE 64)  30 mL Oral BID  . insulin aspart  0-9 Units Subcutaneous Q4H  . insulin glargine  15 Units Subcutaneous QHS  . metronidazole  500 mg Intravenous Q8H  . multivitamin with minerals  1 tablet Oral Daily  . mupirocin ointment  1 application Nasal BID  . vancomycin  1,000 mg Intravenous Q24H    BMET    Component Value Date/Time   NA  134* 01/13/2016 0910   K 3.8 01/13/2016 0910   CL 103 01/13/2016 0910   CO2 21* 01/13/2016 0910   GLUCOSE 233* 01/13/2016 0910   BUN 30* 01/13/2016 0910   CREATININE 2.17* 01/13/2016 0910   CALCIUM 8.0* 01/13/2016 0910   GFRNONAA 30* 01/13/2016 0910   GFRAA 34* 01/13/2016 0910   CBC    Component Value Date/Time   WBC 16.4* 01/14/2016 0700   RBC 4.29 01/14/2016 0700   HGB 11.6* 01/14/2016 0700   HCT 36.3* 01/14/2016 0700   PLT 350 01/14/2016 0700   MCV 84.6 01/14/2016 0700   MCH 27.0 01/14/2016 0700   MCHC 32.0 01/14/2016 0700   RDW 14.4 01/14/2016 0700   LYMPHSABS 1.7 01/13/2016 0910   MONOABS 1.8* 01/13/2016 0910   EOSABS 0.1 01/13/2016 0910   BASOSABS 0.0 01/13/2016 0910     Assessment/Plan:  1. AKI- in setting of early sepsis with gangrenous/ischemic diabetic foot infection. Presumably ischemic ATN. No hydro on [place], h/o nephrotoxic agents (although vanco level pending), and UA not supportive of acute GN. Scr has already started to improve. (serologies negative, low FeNa consistent with pre-renal. Negative SPEP).  SCr continues to  slowly improve.  May need some more IVF's with poor po intake. 1. Continue with conservative management 2. Monitor volume closely given h/o CHF 3. Renal dose meds per pharmacy 4. Avoid ACE/ARB/NSAIDs/CoxII-I's/IV contrast 2. R ischemic foot/diabetic foot infection- s/p RBKA per Dr. Sharol Given 3. Early sepsis- with episodes of hypotension. BP meds on hold. 4. A fib s/p AVR- on heparin drip for surgery, was on eliquis. Per primary svc 5. IDDM- per primary svc 6. Protein malnutrition- will need to increase protein intake with dropping albumin.  [place]

## 2016-01-14 NOTE — Progress Notes (Signed)
Called pharm to verify that heparin is stopped (per mar only the 21cc/hr order was [facility], not the 21.5 order. Pharm said to stop heparin as pt is now on elequis.

## 2016-01-15 LAB — RENAL FUNCTION PANEL
ALBUMIN: 1.6 g/dL — AB (ref 3.5–5.0)
ALBUMIN: 1.6 g/dL — AB (ref 3.5–5.0)
ANION GAP: 5 (ref 5–15)
Anion gap: 6 (ref 5–15)
BUN: 23 mg/dL — ABNORMAL HIGH (ref 6–20)
BUN: 24 mg/dL — AB (ref 6–20)
CALCIUM: 7.8 mg/dL — AB (ref 8.9–10.3)
CALCIUM: 7.9 mg/dL — AB (ref 8.9–10.3)
CHLORIDE: 104 mmol/L (ref 101–111)
CO2: 25 mmol/L (ref 22–32)
CO2: 25 mmol/L (ref 22–32)
CREATININE: 2.13 mg/dL — AB (ref 0.61–1.24)
Chloride: 105 mmol/L (ref 101–111)
Creatinine, Ser: 2.16 mg/dL — ABNORMAL HIGH (ref 0.61–1.24)
GFR calc non Af Amer: 30 mL/min — ABNORMAL LOW (ref >60)
GFR, EST AFRICAN AMERICAN: 34 mL/min — AB (ref >60)
GFR, EST AFRICAN AMERICAN: 35 mL/min — AB (ref >60)
GFR, EST NON AFRICAN AMERICAN: 30 mL/min — AB (ref >60)
GLUCOSE: 223 mg/dL — AB (ref 65–99)
Glucose, Bld: 222 mg/dL — ABNORMAL HIGH (ref 65–99)
PHOSPHORUS: 3 mg/dL (ref 2.5–4.6)
PHOSPHORUS: 3.1 mg/dL (ref 2.5–4.6)
POTASSIUM: 3.5 mmol/L (ref 3.5–5.1)
Potassium: 3.5 mmol/L (ref 3.5–5.1)
SODIUM: 135 mmol/L (ref 135–145)
Sodium: 135 mmol/L (ref 135–145)

## 2016-01-15 LAB — CBC
HEMATOCRIT: 37.5 % — AB (ref 39.0–52.0)
HEMOGLOBIN: 12.2 g/dL — AB (ref 13.0–17.0)
MCH: 27.5 pg (ref 26.0–34.0)
MCHC: 32.5 g/dL (ref 30.0–36.0)
MCV: 84.7 fL (ref 78.0–100.0)
Platelets: 365 10*3/uL (ref 150–400)
RBC: 4.43 MIL/uL (ref 4.22–5.81)
RDW: 14.2 % (ref 11.5–15.5)
WBC: 16.6 10*3/uL — ABNORMAL HIGH (ref 4.0–10.5)

## 2016-01-15 LAB — GLUCOSE, CAPILLARY
GLUCOSE-CAPILLARY: 309 mg/dL — AB (ref 65–99)
GLUCOSE-CAPILLARY: 168 mg/dL — AB (ref 65–99)
Glucose-Capillary: 163 mg/dL — ABNORMAL HIGH (ref 65–99)
Glucose-Capillary: 231 mg/dL — ABNORMAL HIGH (ref 65–99)

## 2016-01-15 MED ORDER — SODIUM CHLORIDE 0.9 % IV SOLN
INTRAVENOUS | Status: DC
  Administered 2016-01-15: 09:00:00 via INTRAVENOUS

## 2016-01-15 MED ORDER — INSULIN ASPART 100 UNIT/ML ~~LOC~~ SOLN
5.0000 [IU] | Freq: Three times a day (TID) | SUBCUTANEOUS | Status: DC
  Administered 2016-01-16 (×2): 5 [IU] via SUBCUTANEOUS

## 2016-01-15 MED ORDER — SENNOSIDES-DOCUSATE SODIUM 8.6-50 MG PO TABS
1.0000 | ORAL_TABLET | Freq: Two times a day (BID) | ORAL | Status: DC
  Administered 2016-01-15 (×2): 1 via ORAL
  Filled 2016-01-15 (×2): qty 1

## 2016-01-15 MED ORDER — DEXTROSE 5 % IV SOLN
2.0000 g | INTRAVENOUS | Status: AC
  Administered 2016-01-15 – 2016-01-16 (×2): 2 g via INTRAVENOUS
  Filled 2016-01-15 (×2): qty 2

## 2016-01-15 MED ORDER — POLYETHYLENE GLYCOL 3350 17 G PO PACK
17.0000 g | PACK | Freq: Every day | ORAL | Status: DC
  Filled 2016-01-15: qty 1

## 2016-01-15 NOTE — Discharge Instructions (Signed)
Information on my medicine - ELIQUIS (apixaban)  This medication education was reviewed with me or my healthcare representative as part of my discharge preparation.  The pharmacist that spoke with me during my hospital stay was:   Rod Holler  Why was Eliquis prescribed for you?  (patient on Eliquis prior to this hospital [place] for history of afib and LAA (heart) blood clot (nov. 2015). Eliquis was prescribed for you to reduce the risk of a blood clot forming that can cause a stroke if you have a medical condition called atrial fibrillation (a type of irregular heartbeat).  What do You need to know about Eliquis ? Take your Eliquis TWICE DAILY - one tablet in the morning and one tablet in the evening with or without food. If you have difficulty swallowing the tablet whole please discuss with your pharmacist how to take the medication safely.  Take Eliquis exactly as prescribed by your doctor and DO NOT stop taking Eliquis without talking to the doctor who prescribed the medication.  Stopping may increase your risk of developing a stroke.  Refill your prescription before you run out.  After discharge, you should have regular check-up appointments with your healthcare provider that is prescribing your Eliquis.  In the future your dose may need to be changed if your kidney function or weight changes by a significant amount or as you get older.  What do you do if you miss a dose? If you miss a dose, take it as soon as you remember on the same day and resume taking twice daily.  Do not take more than one dose of ELIQUIS at the same time to make up a missed dose.  Important Safety Information A possible side effect of Eliquis is bleeding. You should call your healthcare provider right away if you experience any of the following: ? Bleeding from an injury or your nose that does not stop. ? Unusual colored urine (red or dark brown) or unusual colored stools (red or black). ? Unusual bruising for  unknown reasons. ? A serious fall or if you hit your head (even if there is no bleeding).  Some medicines may interact with Eliquis and might increase your risk of bleeding or clotting while on Eliquis. To help avoid this, consult your healthcare provider or pharmacist prior to using any new prescription or non-prescription medications, including herbals, vitamins, non-steroidal anti-inflammatory drugs (NSAIDs) and supplements.  This website has more information on Eliquis (apixaban): http://www.eliquis.com/eliquis/home

## 2016-01-15 NOTE — Progress Notes (Signed)
Patient ID: Thomas Wright, male   DOB: [birth year], 70 y.o.   MRN: [id number] S: Feels a liitle "drugged" this morning O:BP 120/55 mmHg  Pulse 70  Temp(Src) 98.3 F (36.8 C) (Oral)  Resp 16  Ht 6' (1.829 m)  Wt 104.781 kg (231 lb)  BMI 31.32 kg/m2  SpO2 94%  Intake/Output Summary (Last 24 hours) at 01/15/16 0854 Last data filed at 01/15/16 0842  Gross per 24 hour  Intake   1680 ml  Output   2051 ml  Net   -371 ml   Intake/Output: I/O last 3 completed shifts: In: [id number] [P.O.:1437] Out: 3250 [Urine:3150; Blood:100]  Intake/Output this shift:  Total I/O In: 480 [P.O.:480] Out: 251 [Urine:250; Stool:1] Weight change:  Gen:WD WM in NAD CVS:no rub Resp:cta [id number] 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, trace pretib edema   Recent Labs Lab 01/09/16 1642 01/10/16 0436 01/11/16 0420 01/12/16 0047 01/13/16 0910 01/14/16 0700 01/15/16 0800  NA 135 132* 132* 134* 134* 136 135  135  K 4.2 3.5 5.0 3.7 3.8 3.9 3.5  3.5  CL 92* 97* 100* 103 103 105 105  104  CO2 23 22 24 22  21* 22 25  25   GLUCOSE 328* 352* 193* 168* 233* 238* 223*  222*  BUN 17 22* 30* 32* 30* 25* 23*  24*  CREATININE 1.14 1.45* 2.16* 2.34* 2.17* 2.07* 2.16*  2.13*  ALBUMIN 2.6*  --   --  1.7* 1.7* 1.5* 1.6*  1.6*  CALCIUM 9.4 8.1* 8.1* 7.9* 8.0* 7.9* 7.9*  7.8*  PHOS  --   --   --   --  3.4 3.3 3.1  3.0  AST 16  --   --  13*  --   --   --   ALT 11*  --   --  7*  --   --   --    Liver Function Tests:  Recent Labs Lab 01/09/16 1642 01/12/16 0047 01/13/16 0910 01/14/16 0700 01/15/16 0800  AST 16 13*  --   --   --   ALT 11* 7*  --   --   --   ALKPHOS 142* 91  --   --   --   BILITOT 1.4* 0.4  --   --   --   PROT 8.6* 6.0*  --   --   --   ALBUMIN 2.6* 1.7* 1.7* 1.5* 1.6*  1.6*   No results for input(s): LIPASE, AMYLASE in the last 168 hours. No results for input(s): AMMONIA in the last 168 hours. CBC:  Recent Labs Lab 01/10/16 0436 01/11/16 0420 01/12/16 0047 01/13/16 0910  01/14/16 0700 01/15/16 0800  WBC 28.9* 27.5* 25.9* 22.0* 16.4* 16.6*  NEUTROABS 25.2*  --  21.7* 18.4*  --   --   HGB 12.9* 12.1* 11.9* 12.0* 11.6* 12.2*  HCT 38.7* 36.9* 35.5* 36.6* 36.3* 37.5*  MCV 82.2 84.1 82.2 83.4 84.6 84.7  PLT 324 316 289 325 350 365   Cardiac Enzymes:  Recent Labs Lab 01/11/16 0420  CKTOTAL 61   CBG:  Recent Labs Lab 01/14/16 1118 01/14/16 1703 01/14/16 1742 01/14/16 2125 01/15/16 0631  GLUCAP 292* 419* 436* 286* 163*    Iron Studies: No results for input(s): IRON, TIBC, TRANSFERRIN, FERRITIN in the last 72 hours. Studies/Results: No results found. Marland Kitchen amiodarone  200 mg Oral Daily  . apixaban  5 mg Oral BID  . atorvastatin  10 mg Oral q morning - 10a  .  carvedilol  6.25 mg Oral BID WC  . ceFEPime (MAXIPIME) IV  2 g Intravenous Q24H  . Chlorhexidine Gluconate Cloth  6 each Topical Daily  . feeding supplement (PRO-STAT SUGAR FREE 64)  30 mL Oral BID  . insulin aspart  0-15 Units Subcutaneous TID WC  . insulin aspart  0-5 Units Subcutaneous QHS  . insulin glargine  30 Units Subcutaneous QHS  . multivitamin with minerals  1 tablet Oral Daily  . mupirocin ointment  1 application Nasal BID  . polyethylene glycol  17 g Oral Daily  . senna-docusate  1 tablet Oral BID    BMET    Component Value Date/Time   NA 135 01/15/2016 0800   NA 135 01/15/2016 0800   K 3.5 01/15/2016 0800   K 3.5 01/15/2016 0800   CL 105 01/15/2016 0800   CL 104 01/15/2016 0800   CO2 25 01/15/2016 0800   CO2 25 01/15/2016 0800   GLUCOSE 223* 01/15/2016 0800   GLUCOSE 222* 01/15/2016 0800   BUN 23* 01/15/2016 0800   BUN 24* 01/15/2016 0800   CREATININE 2.16* 01/15/2016 0800   CREATININE 2.13* 01/15/2016 0800   CALCIUM 7.9* 01/15/2016 0800   CALCIUM 7.8* 01/15/2016 0800   GFRNONAA 30* 01/15/2016 0800   GFRNONAA 30* 01/15/2016 0800   GFRAA 34* 01/15/2016 0800   GFRAA 35* 01/15/2016 0800   CBC    Component Value Date/Time   WBC 16.6* 01/15/2016 0800   RBC  4.43 01/15/2016 0800   HGB 12.2* 01/15/2016 0800   HCT 37.5* 01/15/2016 0800   PLT 365 01/15/2016 0800   MCV 84.7 01/15/2016 0800   MCH 27.5 01/15/2016 0800   MCHC 32.5 01/15/2016 0800   RDW 14.2 01/15/2016 0800   LYMPHSABS 1.7 01/13/2016 0910   MONOABS 1.8* 01/13/2016 0910   EOSABS 0.1 01/13/2016 0910   BASOSABS 0.0 01/13/2016 0910     Assessment/Plan:  1. AKI- in setting of early sepsis with gangrenous/ischemic diabetic foot infection. Presumably ischemic ATN. No hydro on [place], h/o nephrotoxic agents (although vanco level pending), and UA not supportive of acute GN. Scr has already started to improve. (serologies negative, low FeNa consistent with pre-renal. Negative SPEP). SCr continues to slowly improve. May need some more IVF's with poor po intake. 1. Continue with conservative management 2. Monitor volume closely given h/o CHF 3. Renal dose meds per pharmacy 4. Avoid ACE/ARB/NSAIDs/CoxII-I's/IV contrast 5. Due to increase in Scr, low FeNa, and negative I's/O's will restart gentle IVF's and follow. 2. R ischemic foot/diabetic foot infection- s/p RBKA per Dr. Sharol Given 3. Early sepsis- with episodes of hypotension. BP meds on hold. 4. A fib s/p AVR- on heparin drip for surgery, was on eliquis. Per primary svc 5. IDDM- per primary svc 6. Protein malnutrition- will need to increase protein intake with dropping albumin.  [place]

## 2016-01-15 NOTE — Evaluation (Addendum)
Occupational Therapy Evaluation Patient Details Name: Thomas Wright MRN: [id number] DOB: 02-24-1946 Today's Date: 01/15/2016    History of Present Illness 70 y.o. admitted for Rt BKA. PMHx: Afib, DM, HTN, AVR, CHF, arthritis, HLD, depression, cardiomyopathy.   Clinical Impression   Pt s/p above. Pt independent with ADLs, PTA. Feel pt will benefit from acute OT to increase independence and strength prior to d/c. Recommending CIR for rehab.     Follow Up Recommendations  CIR    Equipment Recommendations  Other (comment) (defer to next venue)    Recommendations for Other Services       Precautions / Restrictions Precautions Precautions: Fall Restrictions Weight Bearing Restrictions: Yes RLE Weight Bearing: Non weight bearing      Mobility Bed Mobility Overal bed mobility: Needs Assistance Bed Mobility: Supine to Sit;Sit to Supine     Supine to sit: Supervision Sit to supine: Supervision      Transfers                 General transfer comment: not assessed-pt felt woozy     Balance        Decreased sitting balance on EOB during functional task, but no physical assist required.                                    ADL Overall ADL's : Needs assistance/impaired     Grooming: Wash/dry face;Sitting;Set up;Supervision/safety (pt washed face sitting EOB)   Upper Body Bathing: Set up;Supervision/ safety;Sitting   Lower Body Bathing: Set up;Supervison/ safety;Sitting/lateral leans Lower Body Bathing Details (indicate cue type and reason): pt able to reach to wash bottom by leaning side to side and also washed front peri area Upper Body Dressing : Minimal assistance;Sitting   Lower Body Dressing: Sitting/lateral leans;Minimal assistance     Toilet Transfer Details (indicate cue type and reason): not assessed         Functional mobility during ADLs:  (Supervision for bed mobility) General ADL Comments: Educated on LB ADL techniques  (leaning, rolling, standing). Educated on desensitization techniques for Rt LE. Explained tub transfer technique using tub bench.      Vision     Perception     Praxis      Pertinent Vitals/Pain Pain Assessment: 0-10 Pain Score: 5  Pain Location: Rt LE Pain Descriptors / Indicators: Sore Pain Intervention(s): Monitored during session;Repositioned     Hand Dominance     Extremity/Trunk Assessment Upper Extremity Assessment Upper Extremity Assessment: Generalized weakness (decreased fine motor coordination in left and right; missing part of one digit on right hand)   Lower Extremity Assessment Lower Extremity Assessment: Defer to PT evaluation RLE Deficits / Details: could not feel when OT touching Rt LE       Communication Communication Communication: No difficulties   Cognition Arousal/Alertness: Awake/alert Behavior During Therapy: WFL for tasks assessed/performed Overall Cognitive Status: No family/caregiver present to determine baseline cognitive functioning (decreased short term memory)                     General Comments       Exercises       Shoulder Instructions      Home Living Family/patient expects to be discharged to:: Inpatient rehab Living Arrangements: Alone Available Help at Discharge: Available PRN/intermittently;Friend(s) Type of Home: Mobile home Home Access: Stairs to enter Entrance Stairs-Number of Steps: 9   Home Layout:  One level     Bathroom Shower/Tub: Teacher, early years/pre: Standard     Home Equipment: Cane - single point;Shower seat          Prior Functioning/Environment Level of Independence: Independent             OT Diagnosis: Generalized weakness;Acute pain   OT Problem List: Decreased strength;Decreased activity tolerance;Impaired balance (sitting and/or standing);Decreased coordination;Decreased knowledge of precautions;Decreased knowledge of use of DME or AE;Pain;Impaired  sensation;Decreased cognition   OT Treatment/Interventions: Self-care/ADL training;DME and/or AE instruction;Therapeutic activities;Patient/family education;Balance training;Cognitive remediation/compensation;Therapeutic exercise    OT Goals(Current goals can be found in the care plan section) Acute Rehab OT Goals Patient Stated Goal: get back to work OT Goal Formulation: With patient Time For Goal Achievement: 01/29/16 Potential to Achieve Goals: Good ADL Goals Pt Will Perform Lower Body Dressing: sit to/from stand;with min assist Pt Will Transfer to Toilet: stand pivot transfer;bedside commode;with min assist Additional ADL Goal #1: Pt will independently perform HEP for bilateral UEs to increase strength.  OT Frequency: Min 2X/week   Barriers to D/C:            Co-evaluation              End of Session    Activity Tolerance: Other (comment) (felt woozy) Patient left: in bed;with call bell/phone within reach   Time: [id number] OT Time Calculation (min): 17 min Charges:  OT General Charges $OT Visit: 1 Procedure OT Evaluation $OT Eval Moderate Complexity: 1 Procedure G-CodesBenito Mccreedy OTR/L [id number] 01/15/2016, 10:46 AM

## 2016-01-15 NOTE — Progress Notes (Signed)
[facility] Progress Note  Patient: Thomas Wright [id number]   PCP: [name], MD DOB: 10-Dec-1945   DOA: 01/09/2016   DOS: 01/15/2016   Date of Service: the patient was seen and examined on 01/15/2016 Outpatient Specialists: Tana Conch cardiology Pulmonary Praveen mannam  Subjective: Patient denies any acute complaint. No nausea no vomiting. Blood sugars has been difficult to control Nutrition: Tolerating oral diet  Brief hospital course: Patient was admitted on 01/09/2016, with complaint of fever chills and right foot discoloration, was found to have right foot gangrene with sepsis. Was started on broad-spectrum antibiotics, orthopedic was consulted and recommended transtibial amputation which is scheduled on 01/13/2016. Patient has chronic right plantar ulcer and has been following up with the wound care center but he stopped going there since last 2 weeks prior to admission. Patient also has mild worsening of his chronic kidney disease, nephrology was also consulted. Renal function appears to be stabilizing. 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 Currently further plan is continue with the current plan of postoperative recovery.  Assessment and Plan: 1. Diabetic foot infection ([place]) Met sepsis criteria on admission with fever, leukocytosis. Currently on vancomycin and cefepime and Flagyl. Negative blood cultures. Orthopedic has been consulted and the patient is scheduled for transfer to be an amputation on 01/13/2016. ABI shows within normal limits. Persistent leukocytosis but improving. Patient initially on vancomycin and cefepime as well as Flagyl. MRSA PCR negative, Antibiotic will be changed to cefepime. Will finish a total 10 day treatment course  2. Acute kidney injury. Most likely in the setting of sepsis. CK normal. Reinitiated IV hydration. Monitor renal function. Discontinue Lasix. Appreciate input from nephrology  3. Chronic  combined CHF. Recent echocardiogram in August 2016 shows normal ejection fraction 50-55% with grade 1 diastolic dysfunction. Continue aspirin and statin and beta blocker. Monitor for volume overload  4.Atrial fibrillation CHA2DS2-VASc Score 4 We'll continue amiodarone and Coreg as well as aspirin  Eliquis resumed after surgery  5. Uncontrolled insulin-dependent type 2 diabetes mellitus. Hemoglobin A1c 12.6 on April 24. Blood sugar mildly elevated. Pt without insulin for last 2-3 months prior to admission. Resuming sliding scale as well as Lantus Adding insulin aspart 5 units pre-meal  Pain management: Patient primarily using Norco, continue Robaxin, maintain morphine for breakthrough pain Activity: physical therapy consulted and recommended CIR Bowel regimen: last BM 01/12/2016 Diet: Cardiac and diabetic diet DVT Prophylaxis: on therapeutic anticoagulation.  Advance goals of care discussion: Full code  Family Communication: no family was present at bedside, at the time of interview.   Disposition:  Expected discharge date: 01/16/2016 Barriers to safe discharge: Postoperative course  Consultants: Orthopedics, nephrology Procedures: None  Antibiotics: Anti-infectives    Start     Dose/Rate Route Frequency Ordered Stop   01/15/16 1500  ceFEPIme (MAXIPIME) 2 g in dextrose 5 % 50 mL IVPB     2 g 100 mL/hr over 30 Minutes Intravenous Every 24 hours 01/15/16 0740 01/17/16 1459   01/13/16 1500  ceFEPIme (MAXIPIME) 2 g in dextrose 5 % 50 mL IVPB  Status:  Discontinued     2 g 100 mL/hr over 30 Minutes Intravenous Every 24 hours 01/12/16 1345 01/14/16 1736   01/13/16 1500  vancomycin (VANCOCIN) IVPB 1000 mg/200 mL premix  Status:  Discontinued     1,000 mg 200 mL/hr over 60 Minutes Intravenous Every 24 hours 01/13/16 1330 01/14/16 1736   01/12/16 1400  ceFEPIme (MAXIPIME) 1 g in dextrose 5 % 50 mL IVPB  1 g 100 mL/hr over 30 Minutes Intravenous  Once 01/12/16 1345 01/12/16  1516   01/12/16 0500  vancomycin (VANCOCIN) 1,250 mg in sodium chloride 0.9 % 250 mL IVPB  Status:  Discontinued     1,250 mg 166.7 mL/hr over 90 Minutes Intravenous Every 24 hours 01/11/16 1353 01/12/16 0746   01/11/16 1800  ceFEPIme (MAXIPIME) 1 g in dextrose 5 % 50 mL IVPB  Status:  Discontinued     1 g 100 mL/hr over 30 Minutes Intravenous Every 12 hours 01/11/16 1354 01/12/16 1345   01/10/16 0815  metroNIDAZOLE (FLAGYL) IVPB 500 mg  Status:  Discontinued     500 mg 100 mL/hr over 60 Minutes Intravenous Every 8 hours 01/10/16 0731 01/14/16 1736   01/10/16 0800  vancomycin (VANCOCIN) IVPB 750 mg/150 ml premix  Status:  Discontinued     750 mg 150 mL/hr over 60 Minutes Intravenous Every 12 hours 01/10/16 0748 01/11/16 1353   01/10/16 0800  ceFEPIme (MAXIPIME) 1 g in dextrose 5 % 50 mL IVPB  Status:  Discontinued     1 g 100 mL/hr over 30 Minutes Intravenous Every 8 hours 01/10/16 0748 01/11/16 1354   01/09/16 1930  cefTRIAXone (ROCEPHIN) 2 g in dextrose 5 % 50 mL IVPB  Status:  Discontinued     2 g 100 mL/hr over 30 Minutes Intravenous Every 24 hours 01/09/16 1924 01/10/16 0732   01/09/16 1930  metroNIDAZOLE (FLAGYL) IVPB 500 mg  Status:  Discontinued     500 mg 100 mL/hr over 60 Minutes Intravenous Every 8 hours 01/09/16 1924 01/10/16 0732   01/09/16 1715  piperacillin-tazobactam (ZOSYN) IVPB 3.375 g     3.375 g 100 mL/hr over 30 Minutes Intravenous  Once 01/09/16 1710 01/09/16 1843   01/09/16 1715  vancomycin (VANCOCIN) IVPB 1000 mg/200 mL premix     1,000 mg 200 mL/hr over 60 Minutes Intravenous  Once 01/09/16 1710 01/09/16 1946      Intake/Output Summary (Last 24 hours) at 01/15/16 1857 Last data filed at 01/15/16 1600  Gross per 24 hour  Intake 1844.33 ml  Output   1051 ml  Net 793.33 ml   Filed Weights   01/13/16 0900 01/14/16 0700 01/15/16 1500  Weight: 104.781 kg (231 lb) 104.781 kg (231 lb) 0.003 kg (0.1 oz)   Objective: Physical Exam: Filed Vitals:   01/14/16  2121 01/15/16 0518 01/15/16 1400 01/15/16 1500  BP: 133/56 120/55 132/63   Pulse: 66 70 68   Temp: 99.1 F (37.3 C) 98.3 F (36.8 C) 98.2 F (36.8 C)   TempSrc: Oral Oral Oral   Resp: _0 Height:      Weight:    0.003 kg (0.1 oz)  SpO2: 97% 94% 96%     General: Appear in mild distress, no Rash; Oral Mucosa moist. Cardiovascular: S1 and S2 Present, no Murmur, no JVD Respiratory: Bilateral Air entry present and Clear to Auscultation, no Crackles, no wheezes Abdomen: Bowel Sound present, Soft and no tenderness Extremities: RIGHT bka  Data Reviewed: CBC:  Recent Labs Lab 01/09/16 1642 01/10/16 0436 01/11/16 0420 01/12/16 0047 01/13/16 0910 01/14/16 0700 01/15/16 0800  WBC 28.3* 28.9* 27.5* 25.9* 22.0* 16.4* 16.6*  NEUTROABS 25.2* 25.2*  --  21.7* 18.4*  --   --   HGB 15.0 12.9* 12.1* 11.9* 12.0* 11.6* 12.2*  HCT 43.9 38.7* 36.9* 35.5* 36.6* 36.3* 37.5*  MCV 83.5 82.2 84.1 82.2 83.4 84.6 84.7  PLT 396 324 316 289 325 350  453   Basic Metabolic Panel:  Recent Labs Lab 01/11/16 0420 01/12/16 0047 01/13/16 0910 01/14/16 0700 01/15/16 0800  NA 132* 134* 134* 136 135  135  K 5.0 3.7 3.8 3.9 3.5  3.5  CL 100* 103 103 105 105  104  CO2 24 22 21* _0 GLUCOSE 193* 168* 233* 238* 223*  222*  BUN 30* 32* 30* 25* 23*  24*  CREATININE 2.16* 2.34* 2.17* 2.07* 2.16*  2.13*  CALCIUM 8.1* 7.9* 8.0* 7.9* 7.9*  7.8*  MG  --  1.7  --   --   --   PHOS  --   --  3.4 3.3 3.1  3.0   GFR: Estimated Creatinine Clearance: 0.1 mL/min (by C-G formula based on Cr of 2.16). Liver Function Tests:  Recent Labs Lab 01/09/16 1642 01/12/16 0047 01/13/16 0910 01/14/16 0700 01/15/16 0800  AST 16 13*  --   --   --   ALT 11* 7*  --   --   --   ALKPHOS 142* 91  --   --   --   BILITOT 1.4* 0.4  --   --   --   PROT 8.6* 6.0*  --   --   --   ALBUMIN 2.6* 1.7* 1.7* 1.5* 1.6*  1.6*   No results for input(s): LIPASE, AMYLASE in the last 168 hours. No results for  input(s): AMMONIA in the last 168 hours. Coagulation Profile:  Recent Labs Lab 01/09/16 1955 01/12/16 0047  INR 1.36 1.63*   Cardiac Enzymes:  Recent Labs Lab 01/11/16 0420  CKTOTAL 61   BNP (last 3 results) No results for input(s): PROBNP in the last 8760 hours. HbA1C: No results for input(s): HGBA1C in the last 72 hours. CBG:  Recent Labs Lab 01/14/16 1742 01/14/16 2125 01/15/16 0631 01/15/16 1131 01/15/16 1722  GLUCAP 436* 286* 163* 231* 309*   Lipid Profile: No results for input(s): CHOL, HDL, LDLCALC, TRIG, CHOLHDL, LDLDIRECT in the last 72 hours. Thyroid Function Tests: No results for input(s): TSH, T4TOTAL, FREET4, T3FREE, THYROIDAB in the last 72 hours. Anemia Panel: No results for input(s): VITAMINB12, FOLATE, FERRITIN, TIBC, IRON, RETICCTPCT in the last 72 hours. Urine analysis:    Component Value Date/Time   COLORURINE YELLOW 01/12/2016 [place] 01/12/2016 1734   LABSPEC 1.018 01/12/2016 1734   PHURINE 5.5 01/12/2016 1734   GLUCOSEU >1000* 01/12/2016 1734   HGBUR NEGATIVE 01/12/2016 1734   BILIRUBINUR NEGATIVE 01/12/2016 1734   KETONESUR NEGATIVE 01/12/2016 1734   PROTEINUR NEGATIVE 01/12/2016 1734   UROBILINOGEN 0.2 07/28/2014 1739   NITRITE NEGATIVE 01/12/2016 1734   LEUKOCYTESUR NEGATIVE 01/12/2016 1734   Sepsis Labs: _1 (procalcitonin:4,lacticidven:4)  ) Recent Results (from the past 240 hour(s))  Culture, blood (Routine x 2)     Status: None   Collection Time: 01/09/16  4:42 PM  Result Value Ref Range Status   Specimen Description BLOOD RIGHT ANTECUBITAL  Final   Special Requests BOTTLES DRAWN AEROBIC AND ANAEROBIC 5CC  Final   Culture NO GROWTH 5 DAYS  Final   Report Status 01/14/2016 FINAL  Final  Culture, blood (Routine x 2)     Status: None   Collection Time: 01/09/16  8:15 PM  Result Value Ref Range Status   Specimen Description BLOOD RIGHT HAND  Final   Special Requests BOTTLES DRAWN AEROBIC AND  ANAEROBIC 5CC  Final   Culture NO GROWTH 5 DAYS  Final   Report Status 01/14/2016  FINAL  Final  Urine culture     Status: Abnormal   Collection Time: 01/10/16  8:44 AM  Result Value Ref Range Status   Specimen Description URINE, CLEAN CATCH  Final   Special Requests NONE  Final   Culture 2,000 COLONIES/mL INSIGNIFICANT GROWTH (A)  Final   Report Status 01/11/2016 FINAL  Final  Surgical pcr screen     Status: Abnormal   Collection Time: 01/12/16  5:59 PM  Result Value Ref Range Status   MRSA, PCR NEGATIVE NEGATIVE Final   Staphylococcus aureus POSITIVE (A) NEGATIVE Final    Comment:        The Xpert SA Assay (FDA approved for NASAL specimens in patients over 78 years of age), is one component of a comprehensive surveillance program.  Test performance has been validated by [facility] for patients greater than or equal to 59 year old. It is not intended to diagnose infection nor to guide or monitor treatment.       Studies: No results found.   Scheduled Meds: . amiodarone  200 mg Oral Daily  . apixaban  5 mg Oral BID  . atorvastatin  10 mg Oral q morning - 10a  . carvedilol  6.25 mg Oral BID WC  . ceFEPime (MAXIPIME) IV  2 g Intravenous Q24H  . Chlorhexidine Gluconate Cloth  6 each Topical Daily  . feeding supplement (PRO-STAT SUGAR FREE 64)  30 mL Oral BID  . insulin aspart  0-15 Units Subcutaneous TID WC  . insulin aspart  0-5 Units Subcutaneous QHS  . [START ON 01/16/2016] insulin aspart  5 Units Subcutaneous TID WC  . insulin glargine  30 Units Subcutaneous QHS  . multivitamin with minerals  1 tablet Oral Daily  . mupirocin ointment  1 application Nasal BID  . polyethylene glycol  17 g Oral Daily  . senna-docusate  1 tablet Oral BID   Continuous Infusions: . sodium chloride 10 mL/hr at 01/14/16 0140  . sodium chloride 75 mL/hr at 01/15/16 0915   PRN Meds: acetaminophen **OR** acetaminophen, HYDROcodone-acetaminophen, HYDROmorphone (DILAUDID) injection,  methocarbamol **OR** methocarbamol (ROBAXIN)  IV, metoCLOPramide **OR** metoCLOPramide (REGLAN) injection, morphine injection, ondansetron **OR** ondansetron (ZOFRAN) IV, oxyCODONE, sodium chloride flush, sodium chloride flush  Time spent: 30 minutes  Author: Berle Mull, MD [facility] Pager: [phone] 01/15/2016 6:57 PM  If 7PM-7AM, please contact night-coverage at [email or web address], password [facility]

## 2016-01-16 ENCOUNTER — Encounter (HOSPITAL_COMMUNITY): Payer: Self-pay | Admitting: Orthopedic Surgery

## 2016-01-16 DIAGNOSIS — I4891 Unspecified atrial fibrillation: Secondary | ICD-10-CM

## 2016-01-16 DIAGNOSIS — I480 Paroxysmal atrial fibrillation: Secondary | ICD-10-CM

## 2016-01-16 DIAGNOSIS — I5042 Chronic combined systolic (congestive) and diastolic (congestive) heart failure: Secondary | ICD-10-CM

## 2016-01-16 DIAGNOSIS — N170 Acute kidney failure with tubular necrosis: Secondary | ICD-10-CM

## 2016-01-16 DIAGNOSIS — Z952 Presence of prosthetic heart valve: Secondary | ICD-10-CM | POA: Diagnosis present

## 2016-01-16 DIAGNOSIS — E1149 Type 2 diabetes mellitus with other diabetic neurological complication: Secondary | ICD-10-CM

## 2016-01-16 DIAGNOSIS — E1142 Type 2 diabetes mellitus with diabetic polyneuropathy: Secondary | ICD-10-CM | POA: Diagnosis present

## 2016-01-16 DIAGNOSIS — N179 Acute kidney failure, unspecified: Secondary | ICD-10-CM | POA: Diagnosis present

## 2016-01-16 DIAGNOSIS — D72829 Elevated white blood cell count, unspecified: Secondary | ICD-10-CM

## 2016-01-16 DIAGNOSIS — Z954 Presence of other heart-valve replacement: Secondary | ICD-10-CM

## 2016-01-16 DIAGNOSIS — I509 Heart failure, unspecified: Secondary | ICD-10-CM

## 2016-01-16 DIAGNOSIS — I10 Essential (primary) hypertension: Secondary | ICD-10-CM

## 2016-01-16 DIAGNOSIS — G8918 Other acute postprocedural pain: Secondary | ICD-10-CM

## 2016-01-16 DIAGNOSIS — Z89511 Acquired absence of right leg below knee: Secondary | ICD-10-CM

## 2016-01-16 DIAGNOSIS — D62 Acute posthemorrhagic anemia: Secondary | ICD-10-CM | POA: Diagnosis present

## 2016-01-16 LAB — COMPREHENSIVE METABOLIC PANEL
ALT: 11 U/L — AB (ref 17–63)
AST: 22 U/L (ref 15–41)
Albumin: 1.5 g/dL — ABNORMAL LOW (ref 3.5–5.0)
Alkaline Phosphatase: 74 U/L (ref 38–126)
Anion gap: 7 (ref 5–15)
BUN: 21 mg/dL — ABNORMAL HIGH (ref 6–20)
CHLORIDE: 106 mmol/L (ref 101–111)
CO2: 23 mmol/L (ref 22–32)
CREATININE: 1.91 mg/dL — AB (ref 0.61–1.24)
Calcium: 7.7 mg/dL — ABNORMAL LOW (ref 8.9–10.3)
GFR calc non Af Amer: 34 mL/min — ABNORMAL LOW (ref >60)
GFR, EST AFRICAN AMERICAN: 40 mL/min — AB (ref >60)
Glucose, Bld: 103 mg/dL — ABNORMAL HIGH (ref 65–99)
POTASSIUM: 3.5 mmol/L (ref 3.5–5.1)
SODIUM: 136 mmol/L (ref 135–145)
TOTAL PROTEIN: 6 g/dL — AB (ref 6.5–8.1)
Total Bilirubin: 0.4 mg/dL (ref 0.3–1.2)

## 2016-01-16 LAB — CBC WITH DIFFERENTIAL/PLATELET
BASOS ABS: 0 10*3/uL (ref 0.0–0.1)
Basophils Relative: 0 %
EOS ABS: 0.3 10*3/uL (ref 0.0–0.7)
EOS PCT: 2 %
HCT: 32.7 % — ABNORMAL LOW (ref 39.0–52.0)
Hemoglobin: 10.7 g/dL — ABNORMAL LOW (ref 13.0–17.0)
LYMPHS ABS: 2.5 10*3/uL (ref 0.7–4.0)
Lymphocytes Relative: 18 %
MCH: 27.8 pg (ref 26.0–34.0)
MCHC: 32.7 g/dL (ref 30.0–36.0)
MCV: 84.9 fL (ref 78.0–100.0)
MONO ABS: 1.9 10*3/uL — AB (ref 0.1–1.0)
Monocytes Relative: 13 %
Neutro Abs: 9.7 10*3/uL — ABNORMAL HIGH (ref 1.7–7.7)
Neutrophils Relative %: 67 %
PLATELETS: 338 10*3/uL (ref 150–400)
RBC: 3.85 MIL/uL — AB (ref 4.22–5.81)
RDW: 14.3 % (ref 11.5–15.5)
WBC: 14.5 10*3/uL — AB (ref 4.0–10.5)

## 2016-01-16 LAB — GLUCOSE, CAPILLARY
GLUCOSE-CAPILLARY: 77 mg/dL (ref 65–99)
GLUCOSE-CAPILLARY: 85 mg/dL (ref 65–99)
Glucose-Capillary: 129 mg/dL — ABNORMAL HIGH (ref 65–99)
Glucose-Capillary: 138 mg/dL — ABNORMAL HIGH (ref 65–99)

## 2016-01-16 MED ORDER — INSULIN GLARGINE 100 UNIT/ML ~~LOC~~ SOLN
22.0000 [IU] | Freq: Every day | SUBCUTANEOUS | Status: DC
  Administered 2016-01-16: 22 [IU] via SUBCUTANEOUS
  Filled 2016-01-16 (×2): qty 0.22

## 2016-01-16 NOTE — Progress Notes (Signed)
Patient ID: Thomas Wright, male   DOB: 1945/10/11, 70 y.o.   MRN: [id number] S: "Sleepy" this morning. No shortness of breath. No LLE edema.   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, trace pretib edema on left   Recent Labs Lab 01/09/16 1642 01/10/16 0436 01/11/16 0420 01/12/16 0047 01/13/16 0910 01/14/16 0700 01/15/16 0800 01/16/16 0324  NA 135 132* 132* 134* 134* 136 135  135 136  K 4.2 3.5 5.0 3.7 3.8 3.9 3.5  3.5 3.5  CL 92* 97* 100* 103 103 105 105  104 106  CO2 23 22 24 22  21* 22 25  25 23   GLUCOSE 328* 352* 193* 168* 233* 238* 223*  222* 103*  BUN 17 22* 30* 32* 30* 25* 23*  24* 21*  CREATININE 1.14 1.45* 2.16* 2.34* 2.17* 2.07* 2.16*  2.13* 1.91*  ALBUMIN 2.6*  --   --  1.7* 1.7* 1.5* 1.6*  1.6* 1.5*  CALCIUM 9.4 8.1* 8.1* 7.9* 8.0* 7.9* 7.9*  7.8* 7.7*  PHOS  --   --   --   --  3.4 3.3 3.1  3.0  --   AST 16  --   --  13*  --   --   --  22  ALT 11*  --   --  7*  --   --   --  11*   Liver Function Tests:  Recent Labs Lab 01/09/16 1642 01/12/16 0047  01/14/16 0700 01/15/16 0800 01/16/16 0324  AST 16 13*  --   --   --  22  ALT 11* 7*  --   --   --  11*  ALKPHOS 142* 91  --   --   --  74  BILITOT 1.4* 0.4  --   --   --  0.4  PROT 8.6* 6.0*  --   --   --  6.0*  ALBUMIN 2.6* 1.7*  < > 1.5* 1.6*  1.6* 1.5*  < > = values in this interval not displayed. No results for input(s): LIPASE, AMYLASE in the  last 168 hours. No results for input(s): AMMONIA in the last 168 hours. CBC:  Recent Labs Lab 01/12/16 0047 01/13/16 0910 01/14/16 0700 01/15/16 0800 01/16/16 0324  WBC 25.9* 22.0* 16.4* 16.6* 14.5*  NEUTROABS 21.7* 18.4*  --   --  9.7*  HGB 11.9* 12.0* 11.6* 12.2* 10.7*  HCT 35.5* 36.6* 36.3* 37.5* 32.7*  MCV 82.2 83.4 84.6 84.7 84.9  PLT 289 325 350 365 338   Cardiac Enzymes:  Recent Labs Lab 01/11/16 0420  CKTOTAL 61   CBG:  Recent Labs Lab 01/15/16 1131 01/15/16 1722 01/15/16 2135 01/16/16 0605 01/16/16 0643  GLUCAP 231* 309* 168* 77 85    Iron Studies: No results for input(s): IRON, TIBC, TRANSFERRIN, FERRITIN in the last 72 hours. Studies/Results: No results found. Marland Kitchen amiodarone  200 mg Oral Daily  . apixaban  5 mg Oral BID  . atorvastatin  10 mg Oral q morning - 10a  . carvedilol  6.25 mg Oral BID WC  . ceFEPime (MAXIPIME) IV  2 g Intravenous Q24H  . Chlorhexidine Gluconate Cloth  6 each Topical Daily  . feeding supplement (PRO-STAT SUGAR FREE 64)  30 mL Oral BID  . insulin aspart  0-15 Units Subcutaneous TID WC  . insulin aspart  0-5 Units Subcutaneous QHS  . insulin aspart  5 Units Subcutaneous TID WC  . insulin glargine  30 Units Subcutaneous QHS  . multivitamin with minerals  1 tablet Oral Daily  . mupirocin ointment  1 application Nasal BID  . polyethylene glycol  17 g Oral Daily  . senna-docusate  1 tablet Oral BID    BMET    Component Value Date/Time   NA 136 01/16/2016 0324   K 3.5 01/16/2016 0324   CL 106 01/16/2016 0324   CO2 23 01/16/2016 0324   GLUCOSE 103* 01/16/2016 0324   BUN 21* 01/16/2016 0324   CREATININE 1.91* 01/16/2016 0324   CALCIUM 7.7* 01/16/2016 0324   GFRNONAA 34* 01/16/2016 0324   GFRAA 40* 01/16/2016 0324   CBC    Component Value Date/Time   WBC 14.5* 01/16/2016 0324   RBC 3.85* 01/16/2016 0324   HGB 10.7* 01/16/2016 0324   HCT 32.7* 01/16/2016 0324   PLT 338 01/16/2016 0324   MCV 84.9 01/16/2016 0324    MCH 27.8 01/16/2016 0324   MCHC 32.7 01/16/2016 0324   RDW 14.3 01/16/2016 0324   LYMPHSABS 2.5 01/16/2016 0324   MONOABS 1.9* 01/16/2016 0324   EOSABS 0.3 01/16/2016 0324   BASOSABS 0.0 01/16/2016 0324     Assessment/Plan:  1. AKI- in setting of early sepsis with gangrenous/ischemic diabetic foot infection. Presumably ischemic ATN. No hydro on [place], h/o nephrotoxic agents, and UA not supportive of acute GN. Scr has already started to improve. (serologies negative, low FeNa consistent with pre-renal. Negative SPEP). SCr continues to slowly improve. May need some more IVF's with poor po intake. 1. Continue with conservative management 2. Monitor volume closely given h/o CHF 3. Renal dose meds per pharmacy 4. Avoid ACE/ARB/NSAIDs/CoxII-I's/IV contrast 5. Due to increase in Scr, low FeNa, and negative I's/O's will continue gentle IVF's and follow. 2. R ischemic foot/diabetic foot infection- s/p RBKA per Dr. Sharol Given 3. Early sepsis- with episodes of hypotension. BP meds on hold. 4. A fib s/p AVR- On eliquis. Per primary svc 5. IDDM- per primary svc 6. Protein malnutrition- will need to increase protein intake with dropping albumin.  Algis Greenhouse. Jerline Pain, [place] Resident PGY-2 01/16/2016 9:25 AM    Assessment/Plan:  1. AKI- in setting of early sepsis with gangrenous/ischemic diabetic foot infection. Presumably ischemic ATN. s/p BKA R, with improving renal fct. Will continue with supportive therapy.  Renal fct appears to be improving and anticipate continued improvement. Maanya Hippert C

## 2016-01-16 NOTE — Consult Note (Signed)
Physical Medicine and Rehabilitation Consult Reason for Consult: Right BKA Referring Physician: [facility]   HPI: Thomas Wright is a 70 y.o. right handed male with history of hypertension, PAF maintained on Eliquis, diabetes mellitus peripheral neuropathy, aortic regurgitation status post valve replacement, chronic combined systolic and diastolic congestive heart failure. Patient lives alone independently with a cane prior to admission. No local family. Mobile home with 9 steps to entry. Presented 01/09/2016 with low-grade fever, tachycardia in the 110s, leukocytosis 28,300, lactic acid 3.58 and gangrenous right foot. X-rays of right foot could not exclude very early changes of osteomyelitis. Patient with elevated creatinine 2.34 from baseline 0.9-1.1. Renal ultrasound no acute abnormalities. Renal service is consulted. Felt AKI in setting of early sepsis presumed ischemic ATN. Conservative care close monitoring of renal function with gentle IV fluids. No change in conservative care of gangrenous right foot. 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 Sharol Given. Application of wound VAC. Hospital course pain management. Acute blood loss anemia 10.7 and monitored. Renal function stabilizing creatinine 1.91. Physical and occupational therapy evaluations completed with recommendations of physical medicine rehabilitation consult.   Review of Systems  Constitutional: Positive for fever. Negative for chills.  HENT: Negative for hearing loss.   Eyes: Negative for blurred vision and double vision.  Respiratory: Positive for shortness of breath. Negative for cough.   Cardiovascular: Positive for palpitations and leg swelling. Negative for chest pain.  Gastrointestinal: Positive for constipation. Negative for nausea and vomiting.  Genitourinary: Positive for urgency. Negative for dysuria and hematuria.  Musculoskeletal: Positive for myalgias and joint pain.  Skin: Negative for rash.  Neurological:  Positive for weakness. Negative for seizures and headaches.  Psychiatric/Behavioral: Positive for depression.  All other systems reviewed and are negative.  Past Medical History  Diagnosis Date  . Hypertension   . PAF (paroxysmal atrial fibrillation) ([place])     a. Postop 2008. b. recurrent afib 08/2014 with LAA clot noted on TEE. EF was down again at 25%. He was anticoagulated with Eliquis and placed on Amiodarone.He ultimately underwent TEE-DCCV 08/2014.  Marland Kitchen Hypertriglyceridemia   . Cardiomyopathy ([place])     EF originally 20%-now 45%, no CAD on cath  . Chronic combined systolic and diastolic CHF (congestive heart failure) ([place])     a. 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 Reported h/o cath in 2008 without significant CAD.  Marland Kitchen Type II diabetes mellitus ([place])   . Arthritis     .  Marland Kitchen Depression     "a little right now" (04/19/2015)  . S/P aortic valve replacement with bioprosthetic valve     a. bioprosthetic AVR in 05/2007 in [place].  . Hyperlipidemia    Past Surgical History  Procedure Laterality Date  . Aortic valve replacement  05/2007    with tissue graft  . I&d extremity Right 05/05/2014    Procedure: IRRIGATION AND DEBRIDEMENT EXTREMITY;  Surgeon: [name], MD;  Location: [facility];  Service: Orthopedics;  Laterality: Right;  . Tee without cardioversion N/A 07/23/2014    Procedure: TRANSESOPHAGEAL ECHOCARDIOGRAM (TEE);  Surgeon: Josue Hector, MD;  Location: [facility] ENDOSCOPY;  Service: Cardiovascular;  Laterality: N/A;  . Cardioversion N/A 07/23/2014    Procedure: CARDIOVERSION;  Surgeon: Josue Hector, MD;  Location: [facility] ENDOSCOPY;  Service: Cardiovascular;  Laterality: N/A;  . Tee without cardioversion N/A 09/01/2014    Procedure: TRANSESOPHAGEAL ECHOCARDIOGRAM (TEE);  Surgeon: Elta Guadeloupe  Marlou Porch, MD;  Location: [facility];  Service: Cardiovascular;  Laterality: N/A;  . Cardioversion N/A 09/01/2014    Procedure:  CARDIOVERSION;  Surgeon: [name], MD;  Location: [facility];  Service: Cardiovascular;  Laterality: N/A;  . Amputation Right 07/29/2014    Procedure: AMPUTATION RIGHT LONG FINGER;  Surgeon: [name], MD;  Location: [facility];  Service: Orthopedics;  Laterality: Right;  . Cardiac valve replacement    . Tonsillectomy  1954  . Cardiac catheterization  "several"   Family History  Problem Relation Age of Onset  . Colon cancer Father   . Diabetes Father   . Liver disease Brother   . Heart murmur Child   . Congenital heart disease Other   . Diabetes Maternal Grandmother   . Heart disease Maternal Grandfather     A Fib   Social History:  reports that he has never smoked. He has never used smokeless tobacco. He reports that he drinks alcohol. He reports that he does not use illicit drugs. Allergies:  Allergies  Allergen Reactions  . Amoxicill-Clarithro-Omeprazole Diarrhea and Nausea And Vomiting   Medications Prior to Admission  Medication Sig Dispense Refill  . amiodarone (PACERONE) 200 MG tablet Take 200 mg by mouth daily. One daily    . apixaban (ELIQUIS) 5 MG TABS tablet Take 1 tablet (5 mg total) by mouth 2 (two) times daily. 60 tablet 3  . aspirin EC 81 MG tablet Take 1 tablet (81 mg total) by mouth daily.    Marland Kitchen atorvastatin (LIPITOR) 10 MG tablet Take 1 tablet (10 mg total) by mouth every morning. 90 tablet 3  . carvedilol (COREG) 6.25 MG tablet Take 1 tablet (6.25 mg total) by mouth 2 (two) times daily with a meal. 180 tablet 3  . DHA-EPA-Coenzyme Q10-Vitamin E (CO Q-10 VITAMIN E FISH OIL PO) Take 1,000 mg by mouth 2 (two) times daily.    . furosemide (LASIX) 20 MG tablet Take 1 tablet (20 mg total) by mouth daily. 90 tablet 3  . guaiFENesin-dextromethorphan (ROBITUSSIN DM) 100-10 MG/5ML syrup Take 5 mLs by mouth at bedtime as needed for cough.    . insulin glargine (LANTUS) 100 UNIT/ML injection Inject 0.25 mLs (25 Units total) into the skin at bedtime. 10 mL 0  .  metFORMIN (GLUCOPHAGE) 1000 MG tablet Take 1,000 mg by mouth 2 (two) times daily with a meal.    . Multiple Vitamin (MULTIVITAMIN) tablet Take 1 tablet by mouth daily.    . sildenafil (VIAGRA) 100 MG tablet Take 1 tablet (100 mg total) by mouth daily as needed for erectile dysfunction (1 hr prior to sexual activity). 6 tablet 12  . Blood Glucose Monitoring Suppl (ACCU-CHEK NANO SMARTVIEW) w/Device KIT Use to check blood sugar 4 times per day dx code E11.9 1 kit 0  . glucose blood (ACCU-CHEK SMARTVIEW) test strip Use as instructed to check blood sugar 4 times per day dx code E11.9 400 each 1  . Insulin Syringe-Needle U-100 31G X 5/16" 0.3 ML MISC Use 3 per day to inject insulin 100 each 3    Home: Home Living Family/patient expects to be discharged to:: Inpatient rehab Living Arrangements: Alone Available Help at Discharge: Available PRN/intermittently, Friend(s) Type of Home: Mobile home Home Access: Stairs to enter [employer] of Steps: 9 Home Layout: One level Bathroom Shower/Tub: Chiropodist: Standard Home Equipment: Cane - single point, Careers adviser History: Prior Function Level of Independence: Independent Functional Status:  Mobility: Bed Mobility Overal bed  mobility: Needs Assistance Bed Mobility: Supine to Sit, Sit to Supine Rolling: Supervision Sidelying to sit: Supervision Supine to sit: Supervision Sit to supine: Supervision General bed mobility comments: cues for sequence with use of rail  Transfers Overall transfer level: Needs assistance Transfers: Sit to/from Stand Sit to Stand: Min assist, +2 safety/equipment, From elevated surface General transfer comment: not assessed-pt felt woozy       ADL: ADL Overall ADL's : Needs assistance/impaired Grooming: Wash/dry face, Sitting, Set up, Supervision/safety (pt washed face sitting EOB) Upper Body Bathing: Set up, Supervision/ safety, Sitting Lower Body Bathing: Set up,  Supervison/ safety, Sitting/lateral leans Lower Body Bathing Details (indicate cue type and reason): pt able to reach to wash bottom by leaning side to side and also washed front peri area Upper Body Dressing : Minimal assistance, Sitting Lower Body Dressing: Sitting/lateral leans, Minimal assistance Toilet Transfer Details (indicate cue type and reason): not assessed Functional mobility during ADLs:  (Supervision for bed mobility) General ADL Comments: Educated on LB ADL techniques (leaning, rolling, standing). Educated on desensitization techniques for Rt LE. Discussed tub transfer technique with tub bench.  Explained tub transfer technique using tub bench.   Cognition: Cognition Overall Cognitive Status: No family/caregiver present to determine baseline cognitive functioning (decreased short term memory) Orientation Level: Oriented X4 Cognition Arousal/Alertness: Awake/alert Behavior During Therapy: WFL for tasks assessed/performed Overall Cognitive Status: No family/caregiver present to determine baseline cognitive functioning (decreased short term memory)  Blood pressure 140/63, pulse 67, temperature 98.2 F (36.8 C), temperature source Oral, resp. rate 16, height 6' (1.829 m), weight 0.003 kg (0.1 oz), SpO2 95 %. Physical Exam  Constitutional: He is oriented to person, place, and time. He appears well-developed.  Obese  HENT:  Head: Normocephalic and atraumatic.  Eyes: Conjunctivae and EOM are normal.  Neck: Normal range of motion. Neck supple. No thyromegaly present.  Cardiovascular: Normal rate and regular rhythm.   Respiratory: Effort normal and breath sounds normal. No respiratory distress.  GI: Soft. Bowel sounds are normal. He exhibits no distension.  Musculoskeletal: He exhibits tenderness. He exhibits no edema.  Neurological: He is alert and oriented to person, place, and time.  Motor: Bilateral upper extremity 4+/5 proximal to distal Left lower extremity hip flexion,  knee extension 4/5 (pain inhibition), ankle dorsi/plantar flexion 5/5 Right lower extremity hip flexion: 4 -/5 (pain inhibition)  Sensation intact light touch  Skin:  BKA site is dressed appropriately tender with wound VAC in place  Psychiatric: He has a normal mood and affect. His behavior is normal.    Results for orders placed or performed during the hospital encounter of 01/09/16 (from the past 24 hour(s))  Glucose, capillary     Status: Abnormal   Collection Time: 01/15/16  6:31 AM  Result Value Ref Range   Glucose-Capillary 163 (H) 65 - 99 mg/dL  CBC     Status: Abnormal   Collection Time: 01/15/16  8:00 AM  Result Value Ref Range   WBC 16.6 (H) 4.0 - 10.5 K/uL   RBC 4.43 4.22 - 5.81 MIL/uL   Hemoglobin 12.2 (L) 13.0 - 17.0 g/dL   HCT 37.5 (L) 39.0 - 52.0 %   MCV 84.7 78.0 - 100.0 fL   MCH 27.5 26.0 - 34.0 pg   MCHC 32.5 30.0 - 36.0 g/dL   RDW 14.2 11.5 - 15.5 %   Platelets 365 150 - 400 K/uL  Renal function panel     Status: Abnormal   Collection Time: 01/15/16  8:00 AM  Result Value Ref Range   Sodium 135 135 - 145 mmol/L   Potassium 3.5 3.5 - 5.1 mmol/L   Chloride 105 101 - 111 mmol/L   CO2 25 22 - 32 mmol/L   Glucose, Bld 223 (H) 65 - 99 mg/dL   BUN 23 (H) 6 - 20 mg/dL   Creatinine, Ser 2.16 (H) 0.61 - 1.24 mg/dL   Calcium 7.9 (L) 8.9 - 10.3 mg/dL   Phosphorus 3.1 2.5 - 4.6 mg/dL   Albumin 1.6 (L) 3.5 - 5.0 g/dL   GFR calc non Af Amer 30 (L) >60 mL/min   GFR calc Af Amer 34 (L) >60 mL/min   Anion gap 5 5 - 15  Renal function panel     Status: Abnormal   Collection Time: 01/15/16  8:00 AM  Result Value Ref Range   Sodium 135 135 - 145 mmol/L   Potassium 3.5 3.5 - 5.1 mmol/L   Chloride 104 101 - 111 mmol/L   CO2 25 22 - 32 mmol/L   Glucose, Bld 222 (H) 65 - 99 mg/dL   BUN 24 (H) 6 - 20 mg/dL   Creatinine, Ser 2.13 (H) 0.61 - 1.24 mg/dL   Calcium 7.8 (L) 8.9 - 10.3 mg/dL   Phosphorus 3.0 2.5 - 4.6 mg/dL   Albumin 1.6 (L) 3.5 - 5.0 g/dL   GFR calc non Af  Amer 30 (L) >60 mL/min   GFR calc Af Amer 35 (L) >60 mL/min   Anion gap 6 5 - 15  Glucose, capillary     Status: Abnormal   Collection Time: 01/15/16 11:31 AM  Result Value Ref Range   Glucose-Capillary 231 (H) 65 - 99 mg/dL   Comment 1 Notify RN   Glucose, capillary     Status: Abnormal   Collection Time: 01/15/16  5:22 PM  Result Value Ref Range   Glucose-Capillary 309 (H) 65 - 99 mg/dL  Glucose, capillary     Status: Abnormal   Collection Time: 01/15/16  9:35 PM  Result Value Ref Range   Glucose-Capillary 168 (H) 65 - 99 mg/dL  CBC with Differential/Platelet     Status: Abnormal   Collection Time: 01/16/16  3:24 AM  Result Value Ref Range   WBC 14.5 (H) 4.0 - 10.5 K/uL   RBC 3.85 (L) 4.22 - 5.81 MIL/uL   Hemoglobin 10.7 (L) 13.0 - 17.0 g/dL   HCT 32.7 (L) 39.0 - 52.0 %   MCV 84.9 78.0 - 100.0 fL   MCH 27.8 26.0 - 34.0 pg   MCHC 32.7 30.0 - 36.0 g/dL   RDW 14.3 11.5 - 15.5 %   Platelets 338 150 - 400 K/uL   Neutrophils Relative % 67 %   Neutro Abs 9.7 (H) 1.7 - 7.7 K/uL   Lymphocytes Relative 18 %   Lymphs Abs 2.5 0.7 - 4.0 K/uL   Monocytes Relative 13 %   Monocytes Absolute 1.9 (H) 0.1 - 1.0 K/uL   Eosinophils Relative 2 %   Eosinophils Absolute 0.3 0.0 - 0.7 K/uL   Basophils Relative 0 %   Basophils Absolute 0.0 0.0 - 0.1 K/uL  Comprehensive metabolic panel     Status: Abnormal   Collection Time: 01/16/16  3:24 AM  Result Value Ref Range   Sodium 136 135 - 145 mmol/L   Potassium 3.5 3.5 - 5.1 mmol/L   Chloride 106 101 - 111 mmol/L   CO2 23 22 - 32 mmol/L   Glucose, Bld 103 (H) 65 - 99  mg/dL   BUN 21 (H) 6 - 20 mg/dL   Creatinine, Ser 1.91 (H) 0.61 - 1.24 mg/dL   Calcium 7.7 (L) 8.9 - 10.3 mg/dL   Total Protein 6.0 (L) 6.5 - 8.1 g/dL   Albumin 1.5 (L) 3.5 - 5.0 g/dL   AST 22 15 - 41 U/L   ALT 11 (L) 17 - 63 U/L   Alkaline Phosphatase 74 38 - 126 U/L   Total Bilirubin 0.4 0.3 - 1.2 mg/dL   GFR calc non Af Amer 34 (L) >60 mL/min   GFR calc Af Amer 40 (L) >60  mL/min   Anion gap 7 5 - 15  Glucose, capillary     Status: None   Collection Time: 01/16/16  6:05 AM  Result Value Ref Range   Glucose-Capillary 77 65 - 99 mg/dL   No results found.  Assessment/Plan: Diagnosis: Right BKA Labs and images independently reviewed.  Records reviewed and summated above. PT/OT for mobility, ADL's, strengthening,and wheelchair training Clean amputation daily with soap and water After removal of VAC Monitor incision site for signs of infection or impending skin breakdown. Staples to remain in place for 3-4 weeks Stump shrinker, for edema control after removal of VAC Scar mobilization massaging to prevent soft tissue adherence Stump protector during therapies Prevent flexion contractures by implementing the following:   Encourage prone lying for 20-30 mins per day BID to avoid hip flexion  Contractures if medically appropriate;  Avoid pillow under knees when patient is lying in bed in order to prevent both  knee and hip flexion contractures;  Avoid prolonged sitting Post surgical pain control with oral medication Phantom limb pain control with physical modalities including desensitization techniques (gentle self massage to the residual stump,hot packs if sensation iintact, [place]) and mirror therapy, TENS. If ineffective, consider pharmacological treatment for neuropathic pain (e.g gabapentin, pregabalin, amytriptalyine, duloxetine).  When using wheelchair, patient should have knee on amputated side fully extended with board under the seat cushion. Avoid injury to contralateral side  1. Does the need for close, 24 hr/day medical supervision in concert with the patient's rehab needs make it unreasonable for this patient to be served in a less intensive setting? Yes  2. Co-Morbidities requiring supervision/potential complications: HTN (monitor and provide prns in accordance with increased physical exertion and pain), PAF (cont meds, monitor HR with increased activity),  diabetes mellitus peripheral neuropathy (Monitor in accordance with exercise and adjust meds as necessary), aortic regurgitation status post valve replacement (Monitor in accordance with increased physical activity and avoid UE resistance excercises), chronic combined systolic and diastolic congestive heart failure (see above), ATN (avoid nephrotoxic meds, ensure adequate hydration), post-op pain (Biofeedback training with therapies to help reduce reliance on opiate pain medications, monitor pain control during therapies, and sedation at rest and titrate to maximum efficacy to ensure participation and gains in therapies), Acute blood loss anemia (transfuse if necessary to ensure appropriate perfusion for increased activity tolerance), leukocytosis (cont to monitor for signs and symptoms of infection, further workup if indicated), Obesity (encourage weight loss) 3. Due to safety, skin/wound care, disease management, pain management and patient education, does the patient require 24 hr/day rehab nursing? Yes 4. Does the patient require coordinated care of a physician, rehab nurse, PT (1-2 hrs/day, 5 days/week) and OT (1-2 hrs/day, 5 days/week) to address physical and functional deficits in the context of the above medical diagnosis(es)? Yes Addressing deficits in the following areas: balance, endurance, locomotion, strength, transferring, bathing, dressing, toileting and psychosocial support  5. Can the patient actively participate in an intensive therapy program of at least 3 hrs of therapy per day at least 5 days per week? Yes 6. The potential for patient to make measurable gains while on inpatient rehab is excellent 7. Anticipated functional outcomes upon discharge from inpatient rehab are modified independent  with PT, modified independent with OT, n/a with SLP. 8. Estimated rehab length of stay to reach the above functional goals is: 8-12 days. 9. Does the patient have adequate social supports and living  environment to accommodate these discharge functional goals? Yes 10. Anticipated D/C setting: Home 11. Anticipated post D/C treatments: HH therapy and Home excercise program 12. Overall Rehab/Functional Prognosis: good  RECOMMENDATIONS: This patient's condition is appropriate for continued rehabilitative care in the following setting: Possibly CIR, Will need to inquire about the potential for accommodations at home as patient has 9 steps to enter and lives alone. Patient has agreed to participate in recommended program. Yes Note that insurance prior authorization may be required for reimbursement for recommended care.  Comment: Rehab Admissions Coordinator to follow up.  Delice Lesch, MD 01/16/2016

## 2016-01-16 NOTE — Progress Notes (Signed)
Inpatient Diabetes Program Recommendations  AACE/ADA: New Consensus Statement on Inpatient Glycemic Control (2015)  Target Ranges:  Prepandial:   less than 140 mg/dL      Peak postprandial:   less than 180 mg/dL (1-2 hours)      Critically ill patients:  140 - 180 mg/dL   Review of Glycemic Control  Consider adding Novolog 4 units TID with meals for elevated postprandials. Thank you  Raoul Pitch BSN, RN,CDE Inpatient Diabetes Coordinator [phone] (team pager)

## 2016-01-16 NOTE — Progress Notes (Signed)
ANTICOAGULATION CONSULT NOTE - Follow Up Consult  Pharmacy Consult for apixaban Indication: atrial fibrillation  Allergies  Allergen Reactions  . Amoxicill-Clarithro-Omeprazole Diarrhea and Nausea And Vomiting   Patient Measurements: Height: 6' (182.9 cm) Weight: (!) 0.1 oz (0.003 kg) (bed wt broken, not working) IBW/kg (Calculated) : 77.6  Vital Signs: Temp: 98.7 F (37.1 C) (05/01 1300) Temp Source: Oral (05/01 1300) BP: 133/57 mmHg (05/01 1300) Pulse Rate: 70 (05/01 1300)  Labs:  Recent Labs  01/14/16 0700 01/14/16 1020 01/15/16 0800 01/16/16 0324  HGB 11.6*  --  12.2* 10.7*  HCT 36.3*  --  37.5* 32.7*  PLT 350  --  365 338  HEPARINUNFRC  --  <0.10*  --   --   CREATININE 2.07*  --  2.16*  2.13* 1.91*    Estimated Creatinine Clearance: 0.1 mL/min (by C-G formula based on Cr of 1.91).  Assessment: Pharmacy is consulted for apixaban for atrial fibrillation.   Hgb dropped slightly to 10.7, plts 338- no overt bleeding noted. Age: 70, Weight: 104kg, SCr 1.9- does not meet requirements for dose decrease.  Goal of Therapy:  Monitor platelets by anticoagulation protocol: Yes   Plan:  - apixaban 5mg  PO BID - CBC q72h - pharmacy to sign off and follow peripherally. Please contact with questions.  Kindal Ponti D. Shreya Lacasse, PharmD, BCPS Clinical Pharmacist Pager: [phone] 01/16/2016 2:00 PM

## 2016-01-16 NOTE — Progress Notes (Signed)
Physical Therapy Treatment Patient Details Name: Thomas Wright MRN: [id number] DOB: April 10, 1946 Today's Date: 01/16/2016    History of Present Illness 70 y.o. admitted for Rt BKA. PMHx: Afib, DM, HTN, AVR, CHF, arthritis, HLD, depression, cardiomyopathy.    PT Comments    Pt pleasant and willing to work with therapy. Pt reported upset due to being unable to rest. Pt with decreased strength and transfers, requiring increased assistance to perform squat pivot transfer. Educated for bil UE and LLE HEP. Will continue to follow. RN aware of session.  Follow Up Recommendations  CIR;Supervision for mobility/OOB     Equipment Recommendations       Recommendations for Other Services       Precautions / Restrictions Precautions Precautions: Fall Restrictions Weight Bearing Restrictions: Yes RLE Weight Bearing: Non weight bearing    Mobility  Bed Mobility   Bed Mobility: Rolling Rolling: Supervision Sidelying to sit: Supervision       General bed mobility comments: use of rail to roll onto side and maintain sidelying position during HEP. supervision for line management.  Transfers Overall transfer level: Needs assistance   Transfers: Squat Pivot Transfers Sit to Stand: Min assist;+2 physical assistance;From elevated surface   Squat pivot transfers: From elevated surface;Max assist;+2 physical assistance     General transfer comment: Initially attempted sit to stand from bed with elevated bed height grossly 32 inches, assist for anterior translation and LLE blocked. Pt able to lean forward and clear buttock but with weight on LLE could not utilize bil UE to push arms and trunk into full extension and returned to EOB. Transitioned to pivot with cues for hand placement. assist to rise and block left knee with squat pivot bed to chair to pt's left. Pt with cues and assist at belt for positioning and sequence but pt with limited ability to fully push through bil UE and LLE with pt  touching RLE to ground during squat pivot but able to complete transition to chair. In chair repositioned for 2nd attempt at pivot back to bed due to pt pain but pt fatigued and unable to attempt return to bed. Maxi move utilized to transfer pt back from chair to bed.   Ambulation/Gait                 Stairs            Wheelchair Mobility    Modified Rankin (Stroke Patients Only)       Balance                                    Cognition Arousal/Alertness: Awake/alert Behavior During Therapy: WFL for tasks assessed/performed Overall Cognitive Status: Within Functional Limits for tasks assessed                      Exercises Amputee Exercises Hip Extension: AROM;Right;15 reps;Sidelying Hip ABduction/ADduction: AROM;Right;15 reps;Supine Knee Extension: AROM;Right;15 reps;Supine Straight Leg Raises: AROM;Left;10 reps;Supine    General Comments        Pertinent Vitals/Pain Pain Score: 5  Pain Location: right LE Pain Descriptors / Indicators: Aching Pain Intervention(s): Patient requesting pain meds-RN notified    Home Living                      Prior Function            PT Goals (current goals can now be  found in the care plan section) Progress towards PT goals: Not progressing toward goals - comment (pt with weak UE and LLE, increased difficulty with squat pivot transfer)    Frequency       PT Plan Current plan remains appropriate    Co-evaluation             End of Session Equipment Utilized During Treatment: Gait belt Activity Tolerance: Patient tolerated treatment well Patient left: in bed;with call bell/phone within reach     Time: 0932-1015 PT Time Calculation (min) (ACUTE ONLY): 43 min  Charges:  $Therapeutic Exercise: 8-22 mins $Therapeutic Activity: 23-37 mins                    G CodesHaze Justin 01/20/2016, 10:47 AM   Haze Justin, SPT [phone]

## 2016-01-16 NOTE — Progress Notes (Signed)
Patient ID: Thomas Wright, male   DOB: [birth year], 70 y.o.   MRN: [id number] Postoperative day 3 transtibial amputation on the right. Patient is progressing slowly. Plan for discharge to skilled nursing. Wound VAC to remain in place for 1 week.

## 2016-01-16 NOTE — Progress Notes (Signed)
Rehab admissions - I have sent information to [facility] requesting acute inpatient rehab admission.  I will follow up with all once I hear back from insurance case manager.  Call me for questions.  [phone]

## 2016-01-16 NOTE — Progress Notes (Signed)
[facility] Progress Note  Patient: Thomas Wright [id number]   PCP: [name], MD DOB: 1946-06-02   DOA: 01/09/2016   DOS: 01/16/2016   Date of Service: the patient was seen and examined on 01/16/2016 Outpatient Specialists: Tana Conch cardiology Pulmonary Praveen mannam  Subjective: The patient mentions pain is well-controled.Denies any nausea or vomiting. No chest pain and abdominal pain. No diarrhea.  Nutrition: Tolerating oral diet  Brief hospital course: Patient was admitted on 01/09/2016, with complaint of fever chills and right foot discoloration, was found to have right foot gangrene with sepsis. Was started on broad-spectrum antibiotics, orthopedic was consulted and recommended transtibial amputation which is scheduled on 01/13/2016. Patient has chronic right plantar ulcer and has been following up with the wound care center but he stopped going there since last 2 weeks prior to admission. Patient also has mild worsening of his chronic kidney disease, nephrology was also consulted. Renal function appears to be stabilizing. 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 Currently further plan is continue with the current plan of postoperative recovery.  Assessment and Plan: 1. Diabetic foot infection ([place])  Right foot gangrene Met sepsis criteria on admission with fever, leukocytosis. Currently on vancomycin and cefepime and Flagyl. Negative blood cultures. Orthopedic has been consulted and the patient is scheduled for transfer to be an amputation on 01/13/2016. ABI shows within normal limits. Persistent leukocytosis but improving. Patient initially on vancomycin and cefepime as well as Flagyl. MRSA PCR negative, Antibiotic will be changed to cefepime. Will finish a total 10 day treatment course  2. Acute kidney injury. Most likely in the setting of sepsis. CK normal. Discontinue IV hydration. Monitor renal function. Discontinue Lasix. Appreciate  input from nephrology  3. Chronic combined CHF. Recent echocardiogram in August 2016 shows normal ejection fraction 50-55% with grade 1 diastolic dysfunction. Continue aspirin and statin and beta blocker. Monitor for volume overload  4.Atrial fibrillation CHA2DS2-VASc Score 4 We'll continue amiodarone and Coreg as well as aspirin  Eliquis resumed after surgery  5. Uncontrolled insulin-dependent type 2 diabetes mellitus. Hemoglobin A1c 12.6 on April 24. Blood sugar mildly elevated. Pt without insulin for last 2-3 months prior to admission. Resuming sliding scale as well as Lantus, reducing the dose of the Lantus Adding insulin aspart 5 units pre-meal  Pain management: Patient primarily using Norco, continue Robaxin, maintain morphine for breakthrough pain Activity: physical therapy consulted and recommended CIR Bowel regimen: last BM 01/16/2016 per patient Diet: Cardiac and diabetic diet DVT Prophylaxis: on therapeutic anticoagulation.  Advance goals of care discussion: Full code  Family Communication: no family was present at bedside, at the time of interview.   Disposition:  Expected discharge date: 01/19/2016 Barriers to safe discharge: CIR approval  Consultants: Orthopedics, nephrology Procedures: right transtibial amputation  Antibiotics: Anti-infectives    Start     Dose/Rate Route Frequency Ordered Stop   01/15/16 1500  ceFEPIme (MAXIPIME) 2 g in dextrose 5 % 50 mL IVPB     2 g 100 mL/hr over 30 Minutes Intravenous Every 24 hours 01/15/16 0740 01/16/16 1430   01/13/16 1500  ceFEPIme (MAXIPIME) 2 g in dextrose 5 % 50 mL IVPB  Status:  Discontinued     2 g 100 mL/hr over 30 Minutes Intravenous Every 24 hours 01/12/16 1345 01/14/16 1736   01/13/16 1500  vancomycin (VANCOCIN) IVPB 1000 mg/200 mL premix  Status:  Discontinued     1,000 mg 200 mL/hr over 60 Minutes Intravenous Every 24 hours 01/13/16 1330 01/14/16 1736  01/12/16 1400  ceFEPIme (MAXIPIME) 1 g in  dextrose 5 % 50 mL IVPB     1 g 100 mL/hr over 30 Minutes Intravenous  Once 01/12/16 1345 01/12/16 1516   01/12/16 0500  vancomycin (VANCOCIN) 1,250 mg in sodium chloride 0.9 % 250 mL IVPB  Status:  Discontinued     1,250 mg 166.7 mL/hr over 90 Minutes Intravenous Every 24 hours 01/11/16 1353 01/12/16 0746   01/11/16 1800  ceFEPIme (MAXIPIME) 1 g in dextrose 5 % 50 mL IVPB  Status:  Discontinued     1 g 100 mL/hr over 30 Minutes Intravenous Every 12 hours 01/11/16 1354 01/12/16 1345   01/10/16 0815  metroNIDAZOLE (FLAGYL) IVPB 500 mg  Status:  Discontinued     500 mg 100 mL/hr over 60 Minutes Intravenous Every 8 hours 01/10/16 0731 01/14/16 1736   01/10/16 0800  vancomycin (VANCOCIN) IVPB 750 mg/150 ml premix  Status:  Discontinued     750 mg 150 mL/hr over 60 Minutes Intravenous Every 12 hours 01/10/16 0748 01/11/16 1353   01/10/16 0800  ceFEPIme (MAXIPIME) 1 g in dextrose 5 % 50 mL IVPB  Status:  Discontinued     1 g 100 mL/hr over 30 Minutes Intravenous Every 8 hours 01/10/16 0748 01/11/16 1354   01/09/16 1930  cefTRIAXone (ROCEPHIN) 2 g in dextrose 5 % 50 mL IVPB  Status:  Discontinued     2 g 100 mL/hr over 30 Minutes Intravenous Every 24 hours 01/09/16 1924 01/10/16 0732   01/09/16 1930  metroNIDAZOLE (FLAGYL) IVPB 500 mg  Status:  Discontinued     500 mg 100 mL/hr over 60 Minutes Intravenous Every 8 hours 01/09/16 1924 01/10/16 0732   01/09/16 1715  piperacillin-tazobactam (ZOSYN) IVPB 3.375 g     3.375 g 100 mL/hr over 30 Minutes Intravenous  Once 01/09/16 1710 01/09/16 1843   01/09/16 1715  vancomycin (VANCOCIN) IVPB 1000 mg/200 mL premix     1,000 mg 200 mL/hr over 60 Minutes Intravenous  Once 01/09/16 1710 01/09/16 1946      Intake/Output Summary (Last 24 hours) at 01/16/16 1826 Last data filed at 01/16/16 1500  Gross per 24 hour  Intake    480 ml  Output   1050 ml  Net   -570 ml   Filed Weights   01/13/16 0900 01/14/16 0700 01/15/16 1500  Weight: 104.781 kg (231  lb) 104.781 kg (231 lb) 0.003 kg (0.1 oz)   Objective: Physical Exam: Filed Vitals:   01/15/16 2030 01/16/16 0447 01/16/16 1300 01/16/16 1613  BP: 128/58 140/63 133/57 147/67  Pulse: 62 67 70 69  Temp: 98.7 F (37.1 C) 98.2 F (36.8 C) 98.7 F (37.1 C)   TempSrc: Oral Oral Oral   Resp: _0 Height:      Weight:      SpO2: 96% 95% 98% 94%    General: Appear in mild distress, no Rash; Oral Mucosa moist. Cardiovascular: S1 and S2 Present, no Murmur, no JVD Respiratory: Bilateral Air entry present and Clear to Auscultation, no Crackles, no wheezes Abdomen: Bowel Sound present, Soft and no tenderness Extremities: RIGHT bka  Data Reviewed: CBC:  Recent Labs Lab 01/10/16 0436  01/12/16 0047 01/13/16 0910 01/14/16 0700 01/15/16 0800 01/16/16 0324  WBC 28.9*  < > 25.9* 22.0* 16.4* 16.6* 14.5*  NEUTROABS 25.2*  --  21.7* 18.4*  --   --  9.7*  HGB 12.9*  < > 11.9* 12.0* 11.6* 12.2* 10.7*  HCT 38.7*  < >  35.5* 36.6* 36.3* 37.5* 32.7*  MCV 82.2  < > 82.2 83.4 84.6 84.7 84.9  PLT 324  < > 289 325 350 365 338  < > = values in this interval not displayed. Basic Metabolic Panel:  Recent Labs Lab 01/12/16 0047 01/13/16 0910 01/14/16 0700 01/15/16 0800 01/16/16 0324  NA 134* 134* 136 135  135 136  K 3.7 3.8 3.9 3.5  3.5 3.5  CL 103 103 105 105  104 106  CO2 22 21* _0 GLUCOSE 168* 233* 238* 223*  222* 103*  BUN 32* 30* 25* 23*  24* 21*  CREATININE 2.34* 2.17* 2.07* 2.16*  2.13* 1.91*  CALCIUM 7.9* 8.0* 7.9* 7.9*  7.8* 7.7*  MG 1.7  --   --   --   --   PHOS  --  3.4 3.3 3.1  3.0  --    GFR: Estimated Creatinine Clearance: 0.1 mL/min (by C-G formula based on Cr of 1.91). Liver Function Tests:  Recent Labs Lab 01/12/16 0047 01/13/16 0910 01/14/16 0700 01/15/16 0800 01/16/16 0324  AST 13*  --   --   --  22  ALT 7*  --   --   --  11*  ALKPHOS 91  --   --   --  74  BILITOT 0.4  --   --   --  0.4  PROT 6.0*  --   --   --  6.0*  ALBUMIN  1.7* 1.7* 1.5* 1.6*  1.6* 1.5*   Coagulation Profile:  Recent Labs Lab 01/09/16 1955 01/12/16 0047  INR 1.36 1.63*   Cardiac Enzymes:  Recent Labs Lab 01/11/16 0420  CKTOTAL 61   CBG:  Recent Labs Lab 01/15/16 2135 01/16/16 0605 01/16/16 0643 01/16/16 1114 01/16/16 1614  GLUCAP 168* 77 85 129* 138*   Urine analysis:    Component Value Date/Time   COLORURINE YELLOW 01/12/2016 [place] 01/12/2016 1734   LABSPEC 1.018 01/12/2016 1734   PHURINE 5.5 01/12/2016 1734   GLUCOSEU >1000* 01/12/2016 1734   HGBUR NEGATIVE 01/12/2016 1734   BILIRUBINUR NEGATIVE 01/12/2016 1734   KETONESUR NEGATIVE 01/12/2016 1734   PROTEINUR NEGATIVE 01/12/2016 1734   UROBILINOGEN 0.2 07/28/2014 1739   NITRITE NEGATIVE 01/12/2016 1734   LEUKOCYTESUR NEGATIVE 01/12/2016 1734    Recent Results (from the past 240 hour(s))  Culture, blood (Routine x 2)     Status: None   Collection Time: 01/09/16  4:42 PM  Result Value Ref Range Status   Specimen Description BLOOD RIGHT ANTECUBITAL  Final   Special Requests BOTTLES DRAWN AEROBIC AND ANAEROBIC 5CC  Final   Culture NO GROWTH 5 DAYS  Final   Report Status 01/14/2016 FINAL  Final  Culture, blood (Routine x 2)     Status: None   Collection Time: 01/09/16  8:15 PM  Result Value Ref Range Status   Specimen Description BLOOD RIGHT HAND  Final   Special Requests BOTTLES DRAWN AEROBIC AND ANAEROBIC 5CC  Final   Culture NO GROWTH 5 DAYS  Final   Report Status 01/14/2016 FINAL  Final  Urine culture     Status: Abnormal   Collection Time: 01/10/16  8:44 AM  Result Value Ref Range Status   Specimen Description URINE, CLEAN CATCH  Final   Special Requests NONE  Final   Culture 2,000 COLONIES/mL INSIGNIFICANT GROWTH (A)  Final   Report Status 01/11/2016 FINAL  Final  Surgical pcr screen  Status: Abnormal   Collection Time: 01/12/16  5:59 PM  Result Value Ref Range Status   MRSA, PCR NEGATIVE NEGATIVE Final   Staphylococcus  aureus POSITIVE (A) NEGATIVE Final    Comment:        The Xpert SA Assay (FDA approved for NASAL specimens in patients over 21 years of age), is one component of a comprehensive surveillance program.  Test performance has been validated by [facility] for patients greater than or equal to 21 year old. It is not intended to diagnose infection nor to guide or monitor treatment.       Studies: No results found.   Scheduled Meds: . amiodarone  200 mg Oral Daily  . apixaban  5 mg Oral BID  . atorvastatin  10 mg Oral q morning - 10a  . carvedilol  6.25 mg Oral BID WC  . Chlorhexidine Gluconate Cloth  6 each Topical Daily  . feeding supplement (PRO-STAT SUGAR FREE 64)  30 mL Oral BID  . insulin aspart  0-15 Units Subcutaneous TID WC  . insulin aspart  0-5 Units Subcutaneous QHS  . insulin glargine  22 Units Subcutaneous QHS  . multivitamin with minerals  1 tablet Oral Daily  . mupirocin ointment  1 application Nasal BID  . polyethylene glycol  17 g Oral Daily  . senna-docusate  1 tablet Oral BID   Continuous Infusions: . sodium chloride 75 mL/hr at 01/15/16 0915   PRN Meds: acetaminophen **OR** acetaminophen, HYDROcodone-acetaminophen, methocarbamol **OR** methocarbamol (ROBAXIN)  IV, metoCLOPramide **OR** metoCLOPramide (REGLAN) injection, morphine injection, ondansetron **OR** ondansetron (ZOFRAN) IV, sodium chloride flush, sodium chloride flush  Time spent: 30 minutes  Author: Berle Mull, MD [facility] Pager: [phone] 01/16/2016 6:26 PM  If 7PM-7AM, please contact night-coverage at [email or web address], password [facility]

## 2016-01-16 NOTE — Consult Note (Signed)
   [facility] CM Inpatient Consult   01/16/2016  Thomas Wright [birth year] [id number]   Patient is currently active with [facility] for chronic disease management services.  Patient has been engaged by a [employer] and CSW.   Patient is a 70 y.o. male admitted with a right transtibial amputation with history of hypertension, PAF maintained on Eliquis, diabetes mellitus peripheral neuropathy, aortic regurgitation status post valve replacement, chronic combined systolic and diastolic congestive heart failure.   Patient is currently seeking inpatient rehab.  Will continue to follow progress for disposition and post hospital needs, if applicable.   Made Inpatient Case Manager aware that [facility] following. Of note, [facility] services does not replace or interfere with any services that are needed or arranged by inpatient case management or social work.  For additional questions or referrals please contact:  Natividad Brood, RN BSN [facility] Liaison  [phone] business mobile phone Toll free office [phone]

## 2016-01-16 NOTE — Care Management Important Message (Signed)
Important Message  Patient Details  Name: Thomas Wright MRN: [id number] Date of Birth: [birth year]   Medicare Important Message Given:  Yes    Jailee Jaquez P Melvern Ramone 01/16/2016, 2:00 PM

## 2016-01-17 ENCOUNTER — Inpatient Hospital Stay (HOSPITAL_COMMUNITY)
Admission: RE | Admit: 2016-01-17 | Discharge: 2016-02-03 | DRG: 559 | Disposition: A | Discharge status: Home Health | Payer: Commercial Managed Care - HMO | Source: Intra-hospital | Attending: Physical Medicine & Rehabilitation | Admitting: Physical Medicine & Rehabilitation

## 2016-01-17 DIAGNOSIS — E1165 Type 2 diabetes mellitus with hyperglycemia: Secondary | ICD-10-CM | POA: Diagnosis not present

## 2016-01-17 DIAGNOSIS — I429 Cardiomyopathy, unspecified: Secondary | ICD-10-CM | POA: Diagnosis not present

## 2016-01-17 DIAGNOSIS — Z794 Long term (current) use of insulin: Secondary | ICD-10-CM

## 2016-01-17 DIAGNOSIS — X58XXXA Exposure to other specified factors, initial encounter: Secondary | ICD-10-CM | POA: Diagnosis not present

## 2016-01-17 DIAGNOSIS — E785 Hyperlipidemia, unspecified: Secondary | ICD-10-CM | POA: Diagnosis not present

## 2016-01-17 DIAGNOSIS — D62 Acute posthemorrhagic anemia: Secondary | ICD-10-CM | POA: Diagnosis present

## 2016-01-17 DIAGNOSIS — R197 Diarrhea, unspecified: Secondary | ICD-10-CM | POA: Diagnosis present

## 2016-01-17 DIAGNOSIS — N170 Acute kidney failure with tubular necrosis: Secondary | ICD-10-CM

## 2016-01-17 DIAGNOSIS — R609 Edema, unspecified: Secondary | ICD-10-CM | POA: Insufficient documentation

## 2016-01-17 DIAGNOSIS — S88111A Complete traumatic amputation at level between knee and ankle, right lower leg, initial encounter: Secondary | ICD-10-CM | POA: Diagnosis present

## 2016-01-17 DIAGNOSIS — S88111S Complete traumatic amputation at level between knee and ankle, right lower leg, sequela: Secondary | ICD-10-CM | POA: Diagnosis not present

## 2016-01-17 DIAGNOSIS — Z89511 Acquired absence of right leg below knee: Secondary | ICD-10-CM | POA: Diagnosis not present

## 2016-01-17 DIAGNOSIS — Z89021 Acquired absence of right finger(s): Secondary | ICD-10-CM | POA: Diagnosis not present

## 2016-01-17 DIAGNOSIS — S99922A Unspecified injury of left foot, initial encounter: Secondary | ICD-10-CM | POA: Diagnosis not present

## 2016-01-17 DIAGNOSIS — Z79899 Other long term (current) drug therapy: Secondary | ICD-10-CM | POA: Diagnosis not present

## 2016-01-17 DIAGNOSIS — Z7901 Long term (current) use of anticoagulants: Secondary | ICD-10-CM

## 2016-01-17 DIAGNOSIS — I509 Heart failure, unspecified: Secondary | ICD-10-CM | POA: Diagnosis not present

## 2016-01-17 DIAGNOSIS — I110 Hypertensive heart disease with heart failure: Secondary | ICD-10-CM | POA: Diagnosis not present

## 2016-01-17 DIAGNOSIS — I480 Paroxysmal atrial fibrillation: Secondary | ICD-10-CM | POA: Diagnosis present

## 2016-01-17 DIAGNOSIS — F419 Anxiety disorder, unspecified: Secondary | ICD-10-CM | POA: Diagnosis not present

## 2016-01-17 DIAGNOSIS — L539 Erythematous condition, unspecified: Secondary | ICD-10-CM | POA: Insufficient documentation

## 2016-01-17 DIAGNOSIS — R5381 Other malaise: Secondary | ICD-10-CM | POA: Diagnosis not present

## 2016-01-17 DIAGNOSIS — Z7982 Long term (current) use of aspirin: Secondary | ICD-10-CM | POA: Diagnosis not present

## 2016-01-17 DIAGNOSIS — I10 Essential (primary) hypertension: Secondary | ICD-10-CM | POA: Diagnosis present

## 2016-01-17 DIAGNOSIS — E11649 Type 2 diabetes mellitus with hypoglycemia without coma: Secondary | ICD-10-CM | POA: Insufficient documentation

## 2016-01-17 DIAGNOSIS — Z4781 Encounter for orthopedic aftercare following surgical amputation: Principal | ICD-10-CM

## 2016-01-17 DIAGNOSIS — E871 Hypo-osmolality and hyponatremia: Secondary | ICD-10-CM | POA: Insufficient documentation

## 2016-01-17 DIAGNOSIS — I5042 Chronic combined systolic (congestive) and diastolic (congestive) heart failure: Secondary | ICD-10-CM | POA: Diagnosis not present

## 2016-01-17 DIAGNOSIS — E1142 Type 2 diabetes mellitus with diabetic polyneuropathy: Secondary | ICD-10-CM | POA: Diagnosis not present

## 2016-01-17 DIAGNOSIS — D72829 Elevated white blood cell count, unspecified: Secondary | ICD-10-CM | POA: Diagnosis not present

## 2016-01-17 DIAGNOSIS — S88019A Complete traumatic amputation at knee level, unspecified lower leg, initial encounter: Secondary | ICD-10-CM | POA: Diagnosis not present

## 2016-01-17 DIAGNOSIS — F4320 Adjustment disorder, unspecified: Secondary | ICD-10-CM

## 2016-01-17 DIAGNOSIS — Z953 Presence of xenogenic heart valve: Secondary | ICD-10-CM

## 2016-01-17 DIAGNOSIS — R7309 Other abnormal glucose: Secondary | ICD-10-CM | POA: Insufficient documentation

## 2016-01-17 DIAGNOSIS — R0602 Shortness of breath: Secondary | ICD-10-CM | POA: Diagnosis not present

## 2016-01-17 LAB — RENAL FUNCTION PANEL
Albumin: 1.6 g/dL — ABNORMAL LOW (ref 3.5–5.0)
Anion gap: 7 (ref 5–15)
BUN: 20 mg/dL (ref 6–20)
CALCIUM: 7.7 mg/dL — AB (ref 8.9–10.3)
CHLORIDE: 105 mmol/L (ref 101–111)
CO2: 23 mmol/L (ref 22–32)
CREATININE: 1.89 mg/dL — AB (ref 0.61–1.24)
GFR, EST AFRICAN AMERICAN: 40 mL/min — AB (ref >60)
GFR, EST NON AFRICAN AMERICAN: 35 mL/min — AB (ref >60)
Glucose, Bld: 167 mg/dL — ABNORMAL HIGH (ref 65–99)
PHOSPHORUS: 3 mg/dL (ref 2.5–4.6)
Potassium: 3.8 mmol/L (ref 3.5–5.1)
Sodium: 135 mmol/L (ref 135–145)

## 2016-01-17 LAB — CBC
HCT: 35 % — ABNORMAL LOW (ref 39.0–52.0)
Hemoglobin: 11.2 g/dL — ABNORMAL LOW (ref 13.0–17.0)
MCH: 26.9 pg (ref 26.0–34.0)
MCHC: 32 g/dL (ref 30.0–36.0)
MCV: 83.9 fL (ref 78.0–100.0)
PLATELETS: 407 10*3/uL — AB (ref 150–400)
RBC: 4.17 MIL/uL — AB (ref 4.22–5.81)
RDW: 14.3 % (ref 11.5–15.5)
WBC: 14 10*3/uL — AB (ref 4.0–10.5)

## 2016-01-17 LAB — GLUCOSE, CAPILLARY
GLUCOSE-CAPILLARY: 158 mg/dL — AB (ref 65–99)
GLUCOSE-CAPILLARY: 138 mg/dL — AB (ref 65–99)
GLUCOSE-CAPILLARY: 264 mg/dL — AB (ref 65–99)
Glucose-Capillary: 205 mg/dL — ABNORMAL HIGH (ref 65–99)
Glucose-Capillary: 197 mg/dL — ABNORMAL HIGH (ref 65–99)

## 2016-01-17 MED ORDER — ACETAMINOPHEN 325 MG PO TABS
650.0000 mg | ORAL_TABLET | Freq: Four times a day (QID) | ORAL | Status: DC | PRN
  Filled 2016-01-17: qty 2

## 2016-01-17 MED ORDER — GLUCERNA SHAKE PO LIQD
237.0000 mL | Freq: Two times a day (BID) | ORAL | Status: DC
  Administered 2016-01-17: 237 mL via ORAL

## 2016-01-17 MED ORDER — APIXABAN 5 MG PO TABS
5.0000 mg | ORAL_TABLET | Freq: Two times a day (BID) | ORAL | Status: DC
  Administered 2016-01-17 – 2016-02-03 (×34): 5 mg via ORAL
  Filled 2016-01-17 (×34): qty 1

## 2016-01-17 MED ORDER — SACCHAROMYCES BOULARDII 250 MG PO CAPS
250.0000 mg | ORAL_CAPSULE | Freq: Two times a day (BID) | ORAL | Status: DC
  Administered 2016-01-17: 250 mg via ORAL
  Filled 2016-01-17: qty 1

## 2016-01-17 MED ORDER — "INSULIN SYRINGE 31G X 5/16"" 0.3 ML MISC": 1.0000 | Freq: Three times a day (TID) | Status: DC

## 2016-01-17 MED ORDER — CARVEDILOL 6.25 MG PO TABS
6.2500 mg | ORAL_TABLET | Freq: Two times a day (BID) | ORAL | Status: DC
  Administered 2016-01-17 – 2016-01-20 (×6): 6.25 mg via ORAL
  Filled 2016-01-17 (×6): qty 1

## 2016-01-17 MED ORDER — HYDROCODONE-ACETAMINOPHEN 5-325 MG PO TABS
1.0000 | ORAL_TABLET | ORAL | Status: DC | PRN
  Administered 2016-01-17 – 2016-01-27 (×48): 2 via ORAL
  Administered 2016-01-28: 1 via ORAL
  Administered 2016-01-28 – 2016-01-30 (×6): 2 via ORAL
  Administered 2016-01-30: 1 via ORAL
  Administered 2016-01-30 – 2016-02-02 (×5): 2 via ORAL
  Administered 2016-02-02: 1 via ORAL
  Administered 2016-02-02: 2 via ORAL
  Administered 2016-02-02: 1 via ORAL
  Administered 2016-02-02: 2 via ORAL
  Administered 2016-02-03: 1 via ORAL
  Filled 2016-01-17: qty 2
  Filled 2016-01-17: qty 1
  Filled 2016-01-17 (×13): qty 2
  Filled 2016-01-17: qty 1
  Filled 2016-01-17 (×6): qty 2
  Filled 2016-01-17: qty 1
  Filled 2016-01-17 (×2): qty 2
  Filled 2016-01-17: qty 1
  Filled 2016-01-17 (×4): qty 2
  Filled 2016-01-17 (×2): qty 1
  Filled 2016-01-17 (×10): qty 2
  Filled 2016-01-17: qty 1
  Filled 2016-01-17 (×27): qty 2
  Filled 2016-01-17: qty 1
  Filled 2016-01-17: qty 2

## 2016-01-17 MED ORDER — INSULIN GLARGINE 100 UNIT/ML ~~LOC~~ SOLN
22.0000 [IU] | Freq: Every day | SUBCUTANEOUS | Status: DC
  Administered 2016-01-17 – 2016-01-19 (×3): 22 [IU] via SUBCUTANEOUS
  Filled 2016-01-17 (×5): qty 0.22

## 2016-01-17 MED ORDER — INSULIN GLARGINE 100 UNIT/ML ~~LOC~~ SOLN: 20.0000 [IU] | Freq: Every day | SUBCUTANEOUS | Status: DC

## 2016-01-17 MED ORDER — SACCHAROMYCES BOULARDII 250 MG PO CAPS: 250.0000 mg | ORAL_CAPSULE | Freq: Two times a day (BID) | ORAL | Status: DC

## 2016-01-17 MED ORDER — INSULIN ASPART 100 UNIT/ML ~~LOC~~ SOLN
0.0000 [IU] | Freq: Three times a day (TID) | SUBCUTANEOUS | Status: DC
  Administered 2016-01-18 (×2): 5 [IU] via SUBCUTANEOUS
  Administered 2016-01-18: 3 [IU] via SUBCUTANEOUS
  Administered 2016-01-19: 5 [IU] via SUBCUTANEOUS
  Administered 2016-01-19 – 2016-01-24 (×11): 3 [IU] via SUBCUTANEOUS
  Administered 2016-01-24: 5 [IU] via SUBCUTANEOUS
  Administered 2016-01-25: 2 [IU] via SUBCUTANEOUS
  Administered 2016-01-25 – 2016-01-27 (×4): 3 [IU] via SUBCUTANEOUS
  Administered 2016-01-27 (×2): 2 [IU] via SUBCUTANEOUS
  Administered 2016-01-28: 5 [IU] via SUBCUTANEOUS
  Administered 2016-01-29: 3 [IU] via SUBCUTANEOUS
  Administered 2016-01-29: 2 [IU] via SUBCUTANEOUS
  Administered 2016-01-29 – 2016-01-30 (×2): 3 [IU] via SUBCUTANEOUS
  Administered 2016-01-30: 5 [IU] via SUBCUTANEOUS
  Administered 2016-01-30: 3 [IU] via SUBCUTANEOUS
  Administered 2016-01-31 – 2016-02-01 (×4): 2 [IU] via SUBCUTANEOUS
  Administered 2016-02-02 (×2): 3 [IU] via SUBCUTANEOUS

## 2016-01-17 MED ORDER — ATORVASTATIN CALCIUM 10 MG PO TABS
10.0000 mg | ORAL_TABLET | Freq: Every morning | ORAL | Status: DC
  Administered 2016-01-18 – 2016-02-03 (×17): 10 mg via ORAL
  Filled 2016-01-17 (×19): qty 1

## 2016-01-17 MED ORDER — PRO-STAT SUGAR FREE PO LIQD: 30.0000 mL | Freq: Two times a day (BID) | ORAL | Status: DC

## 2016-01-17 MED ORDER — ADULT MULTIVITAMIN W/MINERALS CH
1.0000 | ORAL_TABLET | Freq: Every day | ORAL | Status: DC
  Administered 2016-01-18 – 2016-02-03 (×17): 1 via ORAL
  Filled 2016-01-17 (×17): qty 1

## 2016-01-17 MED ORDER — FUROSEMIDE 20 MG PO TABS: 20.0000 mg | ORAL_TABLET | Freq: Every day | ORAL | Status: DC | PRN

## 2016-01-17 MED ORDER — DEXTROSE 5 % IV SOLN
500.0000 mg | Freq: Four times a day (QID) | INTRAVENOUS | Status: DC | PRN
  Filled 2016-01-17: qty 5

## 2016-01-17 MED ORDER — BLOOD GLUCOSE METER KIT: PACK | Status: DC

## 2016-01-17 MED ORDER — METHOCARBAMOL 500 MG PO TABS
500.0000 mg | ORAL_TABLET | Freq: Four times a day (QID) | ORAL | Status: DC | PRN
  Administered 2016-01-18 – 2016-01-26 (×27): 500 mg via ORAL
  Filled 2016-01-17 (×28): qty 1

## 2016-01-17 MED ORDER — ONDANSETRON HCL 4 MG PO TABS
4.0000 mg | ORAL_TABLET | Freq: Four times a day (QID) | ORAL | Status: DC | PRN
  Administered 2016-01-28 – 2016-01-31 (×3): 4 mg via ORAL
  Filled 2016-01-17 (×4): qty 1

## 2016-01-17 MED ORDER — ACETAMINOPHEN 650 MG RE SUPP: 650.0000 mg | Freq: Four times a day (QID) | RECTAL | Status: DC | PRN

## 2016-01-17 MED ORDER — INSULIN PEN NEEDLE 31G X 5 MM MISC: 1.0000 | Freq: Three times a day (TID) | Status: DC

## 2016-01-17 MED ORDER — ONDANSETRON HCL 4 MG/2ML IJ SOLN: 4.0000 mg | Freq: Four times a day (QID) | INTRAMUSCULAR | Status: DC | PRN

## 2016-01-17 MED ORDER — HYDROCODONE-ACETAMINOPHEN 5-325 MG PO TABS: 1.0000 | ORAL_TABLET | ORAL | Status: DC | PRN

## 2016-01-17 MED ORDER — AMIODARONE HCL 200 MG PO TABS
200.0000 mg | ORAL_TABLET | Freq: Every day | ORAL | Status: DC
  Administered 2016-01-18 – 2016-02-03 (×17): 200 mg via ORAL
  Filled 2016-01-17 (×17): qty 1

## 2016-01-17 MED ORDER — SORBITOL 70 % SOLN
30.0000 mL | Freq: Every day | Status: DC | PRN
  Administered 2016-01-29: 30 mL via ORAL
  Filled 2016-01-17: qty 30

## 2016-01-17 NOTE — Progress Notes (Signed)
Report given to Chaney Born on [address]; all questions/concerns addressed. Pt will be transporting in the bed to 4W03. Will continue to monitor

## 2016-01-17 NOTE — Discharge Summary (Addendum)
[facility] Discharge Summary   Patient: Thomas Wright AGT:[id number]   PCP: [name], MD DOB: 1945-11-17   Date of admission: 01/09/2016   Date of discharge:  01/17/2016    Discharge Diagnoses:  Principal Problem:   Diabetic foot infection ([place]) Active Problems:   PAF (paroxysmal atrial fibrillation) ([place])   Essential hypertension   Insulin dependent diabetes mellitus ([place])   Sepsis, unspecified organism ([place])   AKI (acute kidney injury) ([place])   Type 2 diabetes mellitus with peripheral neuropathy ([place])   Status post aortic valve replacement   Chronic combined systolic and diastolic congestive heart failure (HCC)   ATN (acute tubular necrosis) (HCC)   Postoperative pain   Acute blood loss anemia   Leukocytosis   Morbid obesity ([place])   Status post below knee amputation of right lower extremity ([place])   Diarrhea  Recommendations for Outpatient Follow-up:  1.  This follow-up with PCP in one week with CBC and CMP. 2. Follow-up with Dr. Sharol Given as scheduled. 3. Nephrology for acute kidney disease. 4. Call endocrinology in 1 week for a follow-up appointment  Follow-up Information    Follow up with DUDA,MARCUS V, MD In 1 week.   Specialty:  Orthopedic Surgery   Contact information:   [place] [address] [phone]       Follow up with [name], MD. Schedule an appointment as soon as possible for a visit in 1 week.   Specialty:  Family Medicine   Why:  with kidney function   Contact information:   [place] [address] [phone]       Follow up with Donetta Potts, MD. Call in 1 week.   Specialty:  Nephrology   Why:  For follow-up in 1 month   Contact information:   [place] [address] [phone]      Diet recommendation: Cardiac and diabetic diet  Activity: The patient is advised to gradually reintroduce usual activities.  Discharge Condition: good  History of  present illness: As per the H and P dictated on admission, "Thomas Wright is a 70 y.o. male with PMH of paroxysmal atrial fibrillation on Eliquis, insulin-dependent diabetes mellitus, hypertension, aortic regurgitation status post valve replacement, and recurrent soft tissue infections who presents to the ED with fevers, chills, right foot wound, and uncontrolled blood sugars. Patient was previously followed in the wound clinic for an ulcer on the plantar aspect of the right foot but stopped following up last summer. He reports pain in his usual state of health until approximately 2-3 days ago when he noted swelling, color change, and occasional shooting pains that the right foot. The foot is largely insensate, but the patient reports an occasional sharp pain shooting up his right leg. Pain is worse with ambulation and better with rest. Patient believes that he developed a small cut at the plantar aspect of the foot approximately 3 days ago and believes that this is the source of the current wound. Of note, he had been receiving treatment for a wound at the site going back more than a year. Today, with fevers, chills, worsening malaise, and inability to control his blood sugars at home, he came into the ED for evaluation.  In ED, patient was found to be febrile to 100.4, tachycardic in the 110s, and with vital signs otherwise stable. Labs are notable for serum glucose of 328, leukocytosis of 28,300, and lactic acid of 3.58. Chest x-ray was negative for  acute cardiopulmonary disease and radiographs of the right foot demonstrates soft tissue swelling only with no apparent underlying bony abnormality. Blood and urine cultures were obtained, patient was bolused with 30 cc/k of normal saline, and empiric vancomycin and Zosyn were given. Tachycardia resolved with fluid boluses and patient remained hemodynamically stable the emergency department. He'll be admitted for ongoing evaluation and management of sepsis secondary  to diabetic foot wound"  Hospital Course:  Patient was admitted on 01/09/2016, with complaint of fever chills and right foot discoloration, was found to have right foot gangrene with sepsis. Was started on broad-spectrum antibiotics, orthopedic was consulted and recommended transtibial amputation which is scheduled on 01/13/2016. Patient has chronic right plantar ulcer and has been following up with the wound care center but he stopped going there since last 2 weeks prior to admission. Patient also has mild worsening of his chronic kidney disease, nephrology was also consulted. Renal function appears to be stabilizing. 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  Summary of his active problems in the hospital is as following. 1. Diabetic foot infection ([place]) with sepsis Right foot gangrene Met sepsis criteria on admission with fever, leukocytosis. Initially on vancomycin and cefepime and Flagyl. Negative blood cultures. Orthopedic has been consulted and the patient underwent transtibial amputation on 01/13/2016. ABI shows within normal limits. Persistent leukocytosis but improving. MRSA PCR negative,  Antibiotic for changed to cefepime. Patient finished a total 10 day treatment course.  2. Acute kidney injury. Most likely ATN in the setting of sepsis. CK normal. Discontinue Lasix. Appreciate input from nephrology. Recommend patient to follow-up with nephrology to establish care for acute kidney disease. Also recommend to reduced the dose of the Lasix to as needed only  3. Chronic combined CHF. History of coronary artery disease History of aortic valve replacement bioprosthetic Recent echocardiogram in August 2016 shows normal ejection fraction 50-55% with grade 1 diastolic dysfunction. Continue aspirin and statin and beta blocker. Monitor for volume overload. Changing Lasix to as needed orally with daily weight increase or pedal edema or shortness of breath  4.Atrial  fibrillation CHA2DS2-VASc Score 4 We'll continue amiodarone and Coreg as well as aspirin  Eliquis resumed after surgery  5. Uncontrolled insulin-dependent type 2 diabetes mellitus. Hemoglobin A1c 12.6 on April 24. Blood sugar mildly elevated. Pt without insulin for last 2-3 months prior to admission. Patient here on sliding scale insulin and 22 units of Lantus. Patient will need new supply of prenatal short-acting insulin prescription prior to discharge from the CIR  6. Constipation, followed by diarrhea Patient initially had constipation for many days. Started on stool softener and then started having diarrhea. We will add probiotics. A stool softener has been discontinued. Last dose of stool softener was on 01/15/2016.  Currently the patient does not have any abdominal pain tenderness swelling distention or nausea or vomiting to suggest any acute intra-abdominal pathology. Likely secondary to stool softeners.  7. At risk for malnutrition Continue nutritional supplements   If the patient continues to have diarrhea would require C. difficile rule out at the CIR.  All other chronic medical condition were stable during the hospitalization.  Patient was seen by physical therapy, who recommended CIR, which was arranged by Education officer, museum and case Freight forwarder. On the day of the discharge the patient's vitals are stable, and no other acute medical condition were reported by patient. the patient was felt safe to be discharge at [facility] with therapy.  Procedures and Results:  Transtibial amputation 01/13/2016   Consultations:  Orthopedics Dr. Lajoyce Corners  nephrology Dr. Arrie Aran  DISCHARGE MEDICATION: Current Discharge Medication List    START taking these medications   Details  Amino Acids-Protein Hydrolys (FEEDING SUPPLEMENT, PRO-STAT SUGAR FREE 64,) LIQD Take 30 mLs by mouth 2 (two) times daily. Qty: 900 mL, Refills: 0    HYDROcodone-acetaminophen (NORCO/VICODIN) 5-325 MG tablet Take 1-2  tablets by mouth every 4 (four) hours as needed for moderate pain. Qty: 30 tablet, Refills: 0    Insulin Pen Needle 31G X 5 MM MISC 1 each by Does not apply route 4 (four) times daily -  before meals and at bedtime. Qty: 300 each, Refills: 0    saccharomyces boulardii (FLORASTOR) 250 MG capsule Take 1 capsule (250 mg total) by mouth 2 (two) times daily. Qty: 30 capsule, Refills: 0      CONTINUE these medications which have CHANGED   Details  blood glucose meter kit and supplies Dispense based on patient and insurance preference. Use up to four times daily as directed. (FOR ICD-9 250.00, 250.01). Qty: 1 each, Refills: 0    furosemide (LASIX) 20 MG tablet Take 1 tablet (20 mg total) by mouth daily as needed for fluid or edema. Qty: 90 tablet, Refills: 0   Associated Diagnoses: Essential hypertension    insulin glargine (LANTUS) 100 UNIT/ML injection Inject 0.2 mLs (20 Units total) into the skin at bedtime. Qty: 10 mL, Refills: 11    Insulin Syringe-Needle U-100 (INSULIN SYRINGE .3CC/31GX5/16") 31G X 5/16" 0.3 ML MISC 1 each by Does not apply route 4 (four) times daily -  before meals and at bedtime. Qty: 300 each, Refills: 0      CONTINUE these medications which have NOT CHANGED   Details  amiodarone (PACERONE) 200 MG tablet Take 200 mg by mouth daily. One daily    apixaban (ELIQUIS) 5 MG TABS tablet Take 1 tablet (5 mg total) by mouth 2 (two) times daily. Qty: 60 tablet, Refills: 3    aspirin EC 81 MG tablet Take 1 tablet (81 mg total) by mouth daily.    atorvastatin (LIPITOR) 10 MG tablet Take 1 tablet (10 mg total) by mouth every morning. Qty: 90 tablet, Refills: 3    carvedilol (COREG) 6.25 MG tablet Take 1 tablet (6.25 mg total) by mouth 2 (two) times daily with a meal. Qty: 180 tablet, Refills: 3    DHA-EPA-Coenzyme Q10-Vitamin E (CO Q-10 VITAMIN E FISH OIL PO) Take 1,000 mg by mouth 2 (two) times daily.    guaiFENesin-dextromethorphan (ROBITUSSIN DM) 100-10 MG/5ML  syrup Take 5 mLs by mouth at bedtime as needed for cough.    Multiple Vitamin (MULTIVITAMIN) tablet Take 1 tablet by mouth daily.    sildenafil (VIAGRA) 100 MG tablet Take 1 tablet (100 mg total) by mouth daily as needed for erectile dysfunction (1 hr prior to sexual activity). Qty: 6 tablet, Refills: 12    glucose blood (ACCU-CHEK SMARTVIEW) test strip Use as instructed to check blood sugar 4 times per day dx code E11.9 Qty: 400 each, Refills: 1      STOP taking these medications     metFORMIN (GLUCOPHAGE) 1000 MG tablet        Allergies  Allergen Reactions  . Amoxicill-Clarithro-Omeprazole Diarrhea and Nausea And Vomiting   Discharge Instructions    Diet - low sodium heart healthy    Complete by:  As directed      Diet Carb Modified    Complete by:  As directed      Discharge instructions  Complete by:  As directed   It is important that you read following instructions as well as go over your medication list with RN to help you understand your care after this hospitalization.  Discharge Instructions: Please follow-up with PCP in one week with renal function  Please request your primary care physician to go over all Hospital Tests and Procedure/Radiological results at the follow up,  Please get all Hospital records sent to your PCP by signing hospital release before you go home.   Do not drive, operating heavy machinery, perform activities at heights, swimming or participation in water activities or provide baby sitting services while your are on Pain, Sleep and Anxiety Medications; until you have been seen by Primary Care Physician or a Neurologist and advised to do so again. Do not take more than prescribed Pain, Sleep and Anxiety Medications. You were cared for by a hospitalist during your hospital stay. If you have any questions about your discharge medications or the care you received while you were in the hospital after you are discharged, you can call the unit and ask  to speak with the hospitalist on call if the hospitalist that took care of you is not available.  Once you are discharged, your primary care physician will handle any further medical issues. Please note that NO REFILLS for any discharge medications will be authorized once you are discharged, as it is imperative that you return to your primary care physician (or establish a relationship with a primary care physician if you do not have one) for your aftercare needs so that they can reassess your need for medications and monitor your lab values. You Must read complete instructions/literature along with all the possible adverse reactions/side effects for all the Medicines you take and that have been prescribed to you. Take any new Medicines after you have completely understood and accept all the possible adverse reactions/side effects. Wear Seat belts while driving. If you have smoked or chewed Tobacco in the last 2 yrs please stop smoking and/or stop any Recreational drug use.     Increase activity slowly    Complete by:  As directed      Neg Press Wound Therapy / Incisional    Complete by:  As directed   Leave incisional wound VAC in place for 1 week. Then begin daily dry dressing changes.          Discharge Exam: Filed Weights   01/14/16 0700 01/15/16 1500 01/17/16 0400  Weight: 104.781 kg (231 lb) 0.003 kg (0.1 oz) 111.721 kg (246 lb 4.8 oz)   Filed Vitals:   01/16/16 2006 01/17/16 0400  BP: 111/44 139/62  Pulse: 65 68  Temp: 98.6 F (37 C) 98.2 F (36.8 C)  Resp: 18 18   General: Appear in mild distress, no Rash; Oral Mucosa moist. Cardiovascular: S1 and S2 Present, no Murmur, no JVD Respiratory: Bilateral Air entry present and Clear to Auscultation, no Crackles, no wheezes Abdomen: Bowel Sound present, Soft and no tenderness Extremities: trace Pedal edema, no calf tenderness, right BKA Neurology: Grossly no focal neuro deficit.  The results of significant diagnostics from this  hospitalization (including imaging, microbiology, ancillary and laboratory) are listed below for reference.    Significant Diagnostic Studies: Dg Chest 2 View  01/09/2016  CLINICAL DATA:  Possible sepsis.  Large foot infection. EXAM: CHEST  2 VIEW COMPARISON:  Chest radiograph 04/20/2015 FINDINGS: Low lung volumes. Status post median sternotomy. Stable cardiac and mediastinal contours. No consolidative pulmonary opacities. No  pleural effusion or pneumothorax. Regional skeleton is unremarkable. IMPRESSION: No active cardiopulmonary disease. Electronically Signed   By: Lovey Newcomer M.D.   On: 01/09/2016 17:44   US Renal  01/11/2016  CLINICAL DATA:  Acute renal injury EXAM: RENAL / URINARY TRACT ULTRASOUND COMPLETE COMPARISON:  None. FINDINGS: Right Kidney: Length: 14.6 cm.  1.7 cm cyst is noted in the lower pole. Left Kidney: Length: 13.2 cm. Echogenicity within normal limits. No mass or hydronephrosis visualized. Bladder: Appears normal for degree of bladder distention. IMPRESSION: Right renal cyst.  No acute abnormality is noted. Electronically Signed   By: Inez Catalina M.D.   On: 01/11/2016 10:38   Dg Foot 2 Views Right  01/09/2016  CLINICAL DATA:  Possible sepsis.  Necrotic tissue and great toe. EXAM: RIGHT FOOT - 2 VIEW COMPARISON:  None. FINDINGS: No acute fracture. No dislocation. Mild vascular calcifications. There is mild soft tissue swelling about the forefoot but no emphysema. Minimal spurring at the inferior calcaneus. IMPRESSION: No acute bony pathology. Soft tissue swelling over the forefoot is noted. Electronically Signed   By: Marybelle Killings M.D.   On: 01/09/2016 17:46    Microbiology: Recent Results (from the past 240 hour(s))  Culture, blood (Routine x 2)     Status: None   Collection Time: 01/09/16  4:42 PM  Result Value Ref Range Status   Specimen Description BLOOD RIGHT ANTECUBITAL  Final   Special Requests BOTTLES DRAWN AEROBIC AND ANAEROBIC 5CC  Final   Culture NO GROWTH 5 DAYS   Final   Report Status 01/14/2016 FINAL  Final  Culture, blood (Routine x 2)     Status: None   Collection Time: 01/09/16  8:15 PM  Result Value Ref Range Status   Specimen Description BLOOD RIGHT HAND  Final   Special Requests BOTTLES DRAWN AEROBIC AND ANAEROBIC 5CC  Final   Culture NO GROWTH 5 DAYS  Final   Report Status 01/14/2016 FINAL  Final  Urine culture     Status: Abnormal   Collection Time: 01/10/16  8:44 AM  Result Value Ref Range Status   Specimen Description URINE, CLEAN CATCH  Final   Special Requests NONE  Final   Culture 2,000 COLONIES/mL INSIGNIFICANT GROWTH (A)  Final   Report Status 01/11/2016 FINAL  Final  Surgical pcr screen     Status: Abnormal   Collection Time: 01/12/16  5:59 PM  Result Value Ref Range Status   MRSA, PCR NEGATIVE NEGATIVE Final   Staphylococcus aureus POSITIVE (A) NEGATIVE Final    Comment:        The Xpert SA Assay (FDA approved for NASAL specimens in patients over 53 years of age), is one component of a comprehensive surveillance program.  Test performance has been validated by [facility] for patients greater than or equal to 27 year old. It is not intended to diagnose infection nor to guide or monitor treatment.      Labs: CBC:  Recent Labs Lab 01/12/16 0047 01/13/16 0910 01/14/16 0700 01/15/16 0800 01/16/16 0324 01/17/16 0500  WBC 25.9* 22.0* 16.4* 16.6* 14.5* 14.0*  NEUTROABS 21.7* 18.4*  --   --  9.7*  --   HGB 11.9* 12.0* 11.6* 12.2* 10.7* 11.2*  HCT 35.5* 36.6* 36.3* 37.5* 32.7* 35.0*  MCV 82.2 83.4 84.6 84.7 84.9 83.9  PLT 289 325 350 365 338 762*   Basic Metabolic Panel:  Recent Labs Lab 01/12/16 0047 01/13/16 0910 01/14/16 0700 01/15/16 0800 01/16/16 0324 01/17/16 0500  NA 134*  134* 136 135  135 136 135  K 3.7 3.8 3.9 3.5  3.5 3.5 3.8  CL 103 103 105 105  104 106 105  CO2 22 21* _0 GLUCOSE 168* 233* 238* 223*  222* 103* 167*  BUN 32* 30* 25* 23*  24* 21* 20  CREATININE 2.34*  2.17* 2.07* 2.16*  2.13* 1.91* 1.89*  CALCIUM 7.9* 8.0* 7.9* 7.9*  7.8* 7.7* 7.7*  MG 1.7  --   --   --   --   --   PHOS  --  3.4 3.3 3.1  3.0  --  3.0   Liver Function Tests:  Recent Labs Lab 01/12/16 0047 01/13/16 0910 01/14/16 0700 01/15/16 0800 01/16/16 0324 01/17/16 0500  AST 13*  --   --   --  22  --   ALT 7*  --   --   --  11*  --   ALKPHOS 91  --   --   --  74  --   BILITOT 0.4  --   --   --  0.4  --   PROT 6.0*  --   --   --  6.0*  --   ALBUMIN 1.7* 1.7* 1.5* 1.6*  1.6* 1.5* 1.6*   No results for input(s): LIPASE, AMYLASE in the last 168 hours. No results for input(s): AMMONIA in the last 168 hours. Cardiac Enzymes:  Recent Labs Lab 01/11/16 0420  CKTOTAL 61   BNP (last 3 results)  Recent Labs  04/19/15 1828  BNP 65.8   CBG:  Recent Labs Lab 01/16/16 1114 01/16/16 1614 01/16/16 2129 01/17/16 0628 01/17/16 1105  GLUCAP 129* 138* 158* 138* 205*   Time spent: 30 minutes  Signed:  PATEL, PRANAV  [facility]  01/17/2016  , 12:52 PM

## 2016-01-17 NOTE — H&P (View-Only) (Signed)
Physical Medicine and Rehabilitation Admission H&P    Chief Complaint  Patient presents with  . Hyperglycemia  . Wound Infection  : HPI: Thomas Wright is a 70 y.o. right handed male with history of hypertension, PAF maintained on Eliquis, diabetes mellitus peripheral neuropathy, aortic regurgitation status post valve replacement, chronic combined systolic and diastolic congestive heart failure. Patient lives alone independently with a cane prior to admission. No local family. Mobile home with 9 steps to entry. Presented 01/09/2016 with low-grade fever, tachycardia in the 110s, leukocytosis 28,300, lactic acid 3.58 and gangrenous right foot. X-rays of right foot could not exclude very early changes of osteomyelitis. Patient with elevated creatinine 2.34 from baseline 0.9-1.1. Renal ultrasound no acute abnormalities. Renal service  consulted. Felt AKI in setting of early sepsis presumed ischemic ATN. Conservative care close monitoring of renal function with gentle IV fluids.Persistent leukocytosis. 25,900. MRSA PCR screen negative.Initially placed on vancomycin and cefepime and later simplified to cefepime And completed 01/17/2016. No change With conservative care of gangrenous right foot. 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 Sharol Given. Application of wound VAC. Hospital course pain management. Acute blood loss anemia 10.7 and monitored. Renal function stabilizing creatinine 1.91. Physical and occupational therapy evaluations completed with recommendations of physical medicine rehabilitation consult.Patient was admitted for a comprehensive rehabilitation program  ROS Constitutional: Positive for fever. Negative for chills.  HENT: Negative for hearing loss.  Eyes: Negative for blurred vision and double vision.  Respiratory: Positive for shortness of breath. Negative for cough.  Cardiovascular: Positive for palpitations and leg swelling. Negative for chest pain.  Gastrointestinal:  Positive for constipation. Negative for nausea and vomiting.  Genitourinary: Positive for urgency. Negative for dysuria and hematuria.  Musculoskeletal: Positive for myalgias and joint pain.  Skin: Negative for rash.  Neurological: Positive for weakness. Negative for seizures and headaches.  Psychiatric/Behavioral: Positive for depression.  All other systems reviewed and are negative   Past Medical History  Diagnosis Date  . Hypertension   . PAF (paroxysmal atrial fibrillation) ([place])     a. Postop 2008. b. recurrent afib 08/2014 with LAA clot noted on TEE. EF was down again at 25%. He was anticoagulated with Eliquis and placed on Amiodarone.He ultimately underwent TEE-DCCV 08/2014.  Marland Kitchen Hypertriglyceridemia   . Cardiomyopathy ([place])     EF originally 20%-now 45%, no CAD on cath  . Chronic combined systolic and diastolic CHF (congestive heart failure) ([place])     a. 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 Reported h/o cath in 2008 without significant CAD.  Marland Kitchen Type II diabetes mellitus ([place])   . Arthritis     .  Marland Kitchen Depression     "a little right now" (04/19/2015)  . S/P aortic valve replacement with bioprosthetic valve     a. bioprosthetic AVR in 05/2007 in [place].  . Hyperlipidemia    Past Surgical History  Procedure Laterality Date  . Aortic valve replacement  05/2007    with tissue graft  . I&d extremity Right 05/05/2014    Procedure: IRRIGATION AND DEBRIDEMENT EXTREMITY;  Surgeon: [name], MD;  Location: [place];  Service: Orthopedics;  Laterality: Right;  . Tee without cardioversion N/A 07/23/2014    Procedure: TRANSESOPHAGEAL ECHOCARDIOGRAM (TEE);  Surgeon: Josue Hector, MD;  [facility];  Service: Cardiovascular;  Laterality: N/A;  . Cardioversion N/A 07/23/2014    Procedure: CARDIOVERSION;  Surgeon: Josue Hector,  MD;  Location: [place] ENDOSCOPY;  Service: Cardiovascular;  Laterality: N/A;  .  Tee without cardioversion N/A 09/01/2014    Procedure: TRANSESOPHAGEAL ECHOCARDIOGRAM (TEE);  Surgeon: [name], MD;  Location: [facility] ENDOSCOPY;  Service: Cardiovascular;  Laterality: N/A;  . Cardioversion N/A 09/01/2014    Procedure: CARDIOVERSION;  Surgeon: [name], MD;  Location: [facility] ENDOSCOPY;  Service: Cardiovascular;  Laterality: N/A;  . Amputation Right 07/29/2014    Procedure: AMPUTATION RIGHT LONG FINGER;  Surgeon: [name], MD;  Location: [place];  Service: Orthopedics;  Laterality: Right;  . Cardiac valve replacement    . Tonsillectomy  1954  . Cardiac catheterization  "several"  . Amputation Right 01/13/2016    Procedure: RIGHT BELOW KNEE AMPUTATION;  Surgeon: Newt Minion, MD;  Location: [place];  Service: Orthopedics;  Laterality: Right;  . Application of wound vac Right 01/13/2016    Procedure: APPLICATION OF WOUND VAC;  Surgeon: Newt Minion, MD;  Location: [place];  Service: Orthopedics;  Laterality: Right;   Family History  Problem Relation Age of Onset  . Colon cancer Father   . Diabetes Father   . Liver disease Brother   . Heart murmur Child   . Congenital heart disease Other   . Diabetes Maternal Grandmother   . Heart disease Maternal Grandfather     A Fib   Social History:  reports that he has never smoked. He has never used smokeless tobacco. He reports that he drinks alcohol. He reports that he does not use illicit drugs. Allergies:  Allergies  Allergen Reactions  . Amoxicill-Clarithro-Omeprazole Diarrhea and Nausea And Vomiting   Medications Prior to Admission  Medication Sig Dispense Refill  . amiodarone (PACERONE) 200 MG tablet Take 200 mg by mouth daily. One daily    . apixaban (ELIQUIS) 5 MG TABS tablet Take 1 tablet (5 mg total) by mouth 2 (two) times daily. 60 tablet 3  . aspirin EC 81 MG tablet Take 1 tablet (81 mg total) by mouth daily.    Marland Kitchen atorvastatin (LIPITOR) 10 MG tablet Take 1 tablet (10 mg total) by mouth every morning. 90 tablet 3  .  carvedilol (COREG) 6.25 MG tablet Take 1 tablet (6.25 mg total) by mouth 2 (two) times daily with a meal. 180 tablet 3  . DHA-EPA-Coenzyme Q10-Vitamin E (CO Q-10 VITAMIN E FISH OIL PO) Take 1,000 mg by mouth 2 (two) times daily.    . furosemide (LASIX) 20 MG tablet Take 1 tablet (20 mg total) by mouth daily. 90 tablet 3  . guaiFENesin-dextromethorphan (ROBITUSSIN DM) 100-10 MG/5ML syrup Take 5 mLs by mouth at bedtime as needed for cough.    . insulin glargine (LANTUS) 100 UNIT/ML injection Inject 0.25 mLs (25 Units total) into the skin at bedtime. 10 mL 0  . metFORMIN (GLUCOPHAGE) 1000 MG tablet Take 1,000 mg by mouth 2 (two) times daily with a meal.    . Multiple Vitamin (MULTIVITAMIN) tablet Take 1 tablet by mouth daily.    . sildenafil (VIAGRA) 100 MG tablet Take 1 tablet (100 mg total) by mouth daily as needed for erectile dysfunction (1 hr prior to sexual activity). 6 tablet 12  . Blood Glucose Monitoring Suppl (ACCU-CHEK NANO SMARTVIEW) w/Device KIT Use to check blood sugar 4 times per day dx code E11.9 1 kit 0  . glucose blood (ACCU-CHEK SMARTVIEW) test strip Use as instructed to check blood sugar 4 times per day dx code E11.9 400 each 1  . Insulin Syringe-Needle U-100 31G X  5/16" 0.3 ML MISC Use 3 per day to inject insulin 100 each 3    Home: Home Living Family/patient expects to be discharged to:: Inpatient rehab Living Arrangements: Alone Available Help at Discharge: Available PRN/intermittently, Friend(s) Type of Home: Mobile home Home Access: Stairs to enter [employer] of Steps: 9 Home Layout: One level Bathroom Shower/Tub: Chiropodist: Standard Home Equipment: Cane - single point, Industrial/product designer History: Prior Function Level of Independence: Independent  Functional Status:  Mobility: Bed Mobility Overal bed mobility: Needs Assistance Bed Mobility: Rolling, Sidelying to Sit Rolling: Modified independent (Device/Increase  time) Sidelying to sit: Modified independent (Device/Increase time) Supine to sit: Supervision Sit to supine: Supervision General bed mobility comments: able to roll onto side with use of rail, and rise to sitting with increased time needed. Transfers Overall transfer level: Needs assistance Transfers: Squat Pivot Transfers Sit to Stand: Min assist, +2 physical assistance, From elevated surface Squat pivot transfers: From elevated surface, Min assist, +2 safety/equipment General transfer comment: able to perform squat pivot with cues for hand placement and anterior translation. assist to clear and shift pelvis with use of pad. performed initiation of transfer 2 additional times to increase pt comfortability with transfer.      ADL: ADL Overall ADL's : Needs assistance/impaired Grooming: Wash/dry face, Sitting, Set up, Supervision/safety (pt washed face sitting EOB) Upper Body Bathing: Set up, Supervision/ safety, Sitting Lower Body Bathing: Set up, Supervison/ safety, Sitting/lateral leans Lower Body Bathing Details (indicate cue type and reason): pt able to reach to wash bottom by leaning side to side and also washed front peri area Upper Body Dressing : Minimal assistance, Sitting Lower Body Dressing: Sitting/lateral leans, Minimal assistance Toilet Transfer Details (indicate cue type and reason): not assessed Functional mobility during ADLs:  (Supervision for bed mobility) General ADL Comments: Educated on LB ADL techniques (leaning, rolling, standing). Educated on desensitization techniques for Rt LE. Discussed tub transfer technique with tub bench.  Explained tub transfer technique using tub bench.   Cognition: Cognition Overall Cognitive Status: Within Functional Limits for tasks assessed Orientation Level: Oriented X4 Cognition Arousal/Alertness: Awake/alert Behavior During Therapy: WFL for tasks assessed/performed Overall Cognitive Status: Within Functional Limits for tasks  assessed  Physical Exam: Blood pressure 139/62, pulse 68, temperature 98.2 F (36.8 C), temperature source Oral, resp. rate 18, height 6' (1.829 m), weight 111.721 kg (246 lb 4.8 oz), SpO2 97 %. Physical Exam Constitutional: He is oriented to person, place, and time. He appears well-developed.  Obese  HENT: oral mucosa pink and moist Head: Normocephalic and atraumatic.  Eyes: Conjunctivae and EOM are normal.  Neck: Normal range of motion. Neck supple. No thyromegaly present.  Cardiovascular: Normal rate and regular rhythm. no murmurs or rubs Respiratory: Effort normal and breath sounds normal. No respiratory distress. No wheezes or rales. GI: Soft. Bowel sounds are normal. He exhibits no distension.  Musculoskeletal: He exhibits tenderness. He exhibits no edema.  Neurological: He is alert and oriented to person, place, and time.  Motor: Bilateral upper extremity 4+/5 proximal to distal Left lower extremity hip flexion, knee extension 4- to 4/5 (pain inhibition), ankle dorsi/plantar flexion 5/5 Right lower extremity hip flexion: 4 -/5 (pain inhibition)  Sensation intact light touch in all limbs.  Skin:  BKA site is dressed with wound VAC in place  Psychiatric: He has a normal mood and affect. His behavior is normal   Results for orders placed or performed during the hospital encounter of 01/09/16 (from the  past 48 hour(s))  Glucose, capillary     Status: Abnormal   Collection Time: 01/15/16 11:31 AM  Result Value Ref Range   Glucose-Capillary 231 (H) 65 - 99 mg/dL   Comment 1 Notify RN   Glucose, capillary     Status: Abnormal   Collection Time: 01/15/16  5:22 PM  Result Value Ref Range   Glucose-Capillary 309 (H) 65 - 99 mg/dL  Glucose, capillary     Status: Abnormal   Collection Time: 01/15/16  9:35 PM  Result Value Ref Range   Glucose-Capillary 168 (H) 65 - 99 mg/dL  CBC with Differential/Platelet     Status: Abnormal   Collection Time: 01/16/16  3:24 AM  Result Value Ref  Range   WBC 14.5 (H) 4.0 - 10.5 K/uL   RBC 3.85 (L) 4.22 - 5.81 MIL/uL   Hemoglobin 10.7 (L) 13.0 - 17.0 g/dL   HCT 32.7 (L) 39.0 - 52.0 %   MCV 84.9 78.0 - 100.0 fL   MCH 27.8 26.0 - 34.0 pg   MCHC 32.7 30.0 - 36.0 g/dL   RDW 14.3 11.5 - 15.5 %   Platelets 338 150 - 400 K/uL   Neutrophils Relative % 67 %   Neutro Abs 9.7 (H) 1.7 - 7.7 K/uL   Lymphocytes Relative 18 %   Lymphs Abs 2.5 0.7 - 4.0 K/uL   Monocytes Relative 13 %   Monocytes Absolute 1.9 (H) 0.1 - 1.0 K/uL   Eosinophils Relative 2 %   Eosinophils Absolute 0.3 0.0 - 0.7 K/uL   Basophils Relative 0 %   Basophils Absolute 0.0 0.0 - 0.1 K/uL  Comprehensive metabolic panel     Status: Abnormal   Collection Time: 01/16/16  3:24 AM  Result Value Ref Range   Sodium 136 135 - 145 mmol/L   Potassium 3.5 3.5 - 5.1 mmol/L   Chloride 106 101 - 111 mmol/L   CO2 23 22 - 32 mmol/L   Glucose, Bld 103 (H) 65 - 99 mg/dL   BUN 21 (H) 6 - 20 mg/dL   Creatinine, Ser 1.91 (H) 0.61 - 1.24 mg/dL   Calcium 7.7 (L) 8.9 - 10.3 mg/dL   Total Protein 6.0 (L) 6.5 - 8.1 g/dL   Albumin 1.5 (L) 3.5 - 5.0 g/dL   AST 22 15 - 41 U/L   ALT 11 (L) 17 - 63 U/L   Alkaline Phosphatase 74 38 - 126 U/L   Total Bilirubin 0.4 0.3 - 1.2 mg/dL   GFR calc non Af Amer 34 (L) >60 mL/min   GFR calc Af Amer 40 (L) >60 mL/min    Comment: (NOTE) The eGFR has been calculated using the CKD EPI equation. This calculation has not been validated in all clinical situations. eGFR's persistently <60 mL/min signify possible Chronic Kidney Disease.    Anion gap 7 5 - 15  Glucose, capillary     Status: None   Collection Time: 01/16/16  6:05 AM  Result Value Ref Range   Glucose-Capillary 77 65 - 99 mg/dL  Glucose, capillary     Status: None   Collection Time: 01/16/16  6:43 AM  Result Value Ref Range   Glucose-Capillary 85 65 - 99 mg/dL  Glucose, capillary     Status: Abnormal   Collection Time: 01/16/16 11:14 AM  Result Value Ref Range   Glucose-Capillary 129 (H)  65 - 99 mg/dL   Comment 1 Notify RN   Glucose, capillary     Status: Abnormal  Collection Time: 01/16/16  4:14 PM  Result Value Ref Range   Glucose-Capillary 138 (H) 65 - 99 mg/dL  Glucose, capillary     Status: Abnormal   Collection Time: 01/16/16  9:29 PM  Result Value Ref Range   Glucose-Capillary 158 (H) 65 - 99 mg/dL  CBC     Status: Abnormal   Collection Time: 01/17/16  5:00 AM  Result Value Ref Range   WBC 14.0 (H) 4.0 - 10.5 K/uL   RBC 4.17 (L) 4.22 - 5.81 MIL/uL   Hemoglobin 11.2 (L) 13.0 - 17.0 g/dL   HCT 35.0 (L) 39.0 - 52.0 %   MCV 83.9 78.0 - 100.0 fL   MCH 26.9 26.0 - 34.0 pg   MCHC 32.0 30.0 - 36.0 g/dL   RDW 14.3 11.5 - 15.5 %   Platelets 407 (H) 150 - 400 K/uL  Renal function panel     Status: Abnormal   Collection Time: 01/17/16  5:00 AM  Result Value Ref Range   Sodium 135 135 - 145 mmol/L   Potassium 3.8 3.5 - 5.1 mmol/L   Chloride 105 101 - 111 mmol/L   CO2 23 22 - 32 mmol/L   Glucose, Bld 167 (H) 65 - 99 mg/dL   BUN 20 6 - 20 mg/dL   Creatinine, Ser 1.89 (H) 0.61 - 1.24 mg/dL   Calcium 7.7 (L) 8.9 - 10.3 mg/dL   Phosphorus 3.0 2.5 - 4.6 mg/dL   Albumin 1.6 (L) 3.5 - 5.0 g/dL   GFR calc non Af Amer 35 (L) >60 mL/min   GFR calc Af Amer 40 (L) >60 mL/min    Comment: (NOTE) The eGFR has been calculated using the CKD EPI equation. This calculation has not been validated in all clinical situations. eGFR's persistently <60 mL/min signify possible Chronic Kidney Disease.    Anion gap 7 5 - 15  Glucose, capillary     Status: Abnormal   Collection Time: 01/17/16  6:28 AM  Result Value Ref Range   Glucose-Capillary 138 (H) 65 - 99 mg/dL   No results found.     Medical Problem List and Plan: 1.  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  DVT Prophylaxis/Anticoagulation: Continue Eliquis as prior to admission. 3. Pain Management: Hydrocodone and  Robaxin as needed. Monitor with increased mobility 4. Diabetes mellitus with peripheral neuropathy. Hemoglobin A1c 12.6. Lantus insulin 22 units daily. Check blood sugars before meals and at bedtime. Diabetic teaching 5. Neuropsych: This patient is capable of making decisions on his own behalf. 6. Skin/Wound Care: Routine skin checks. Wound VAC remain in place 1 week post-op 7. Fluids/Electrolytes/Nutrition: Routine I&O with follow-up chemistries 8.Leukocytosis. Slowly improving. Maxipime completed 01/17/2016 9. Hypertension/PAF. Cardiac rate controlled. Amiodarone 200 mg daily, Coreg 6.25 mg twice a day. 10. Chronic combined systolic and diastolic congestive heart failure. No signs of fluid overload. Weigh patient daily  11. Acute renal insufficiency/presumably ischemic ATN. Renal ultrasound unremarkable. Latest creatinine 1.89. Follow-up chemistries. Renal service is following. 12. Hyperlipidemia. Lipitor   Post Admission Physician Evaluation: 1. Functional deficits secondary  to right BKA/debility. 2. Patient is admitted to receive collaborative, interdisciplinary care between the physiatrist, rehab nursing staff, and therapy team. 3. Patient's level of medical complexity and substantial therapy needs in context of that medical necessity cannot be provided at a lesser intensity of care such as a SNF. 4. Patient has experienced substantial functional loss from his/her baseline which was  documented above under the "Functional History" and "Functional Status" headings.  Judging by the patient's diagnosis, physical exam, and functional history, the patient has potential for functional progress which will result in measurable gains while on inpatient rehab.  These gains will be of substantial and practical use upon discharge  in facilitating mobility and self-care at the household level. 5. Physiatrist will provide 24 hour management of medical needs as well as oversight of the therapy plan/treatment  and provide guidance as appropriate regarding the interaction of the two. 6. 24 hour rehab nursing will assist with bladder management, bowel management, safety, skin/wound care, disease management, medication administration, pain management and patient education  and help integrate therapy concepts, techniques,education, etc. 7. PT will assess and treat for/with: Lower extremity strength, range of motion, stamina, balance, functional mobility, safety, adaptive techniques and equipment, pre-prosthetic education, pain mgt, wound care, vac mgt.   Goals are: mod I. 8. OT will assess and treat for/with: ADL's, functional mobility, safety, upper extremity strength, adaptive techniques and equipment, pain mgt, pre-prosthetic education, ego support.   Goals are: mod I. Therapy may not yet proceed with showering this patient. 9. SLP will assess and treat for/with: n/a.  Goals are: n/a. 10. Case Management and Social Worker will assess and treat for psychological issues and discharge planning. 11. Team conference will be held weekly to assess progress toward goals and to determine barriers to discharge. 12. Patient will receive at least 3 hours of therapy per day at least 5 days per week. 13. ELOS: 7-9 days       14. Prognosis:  excellent     Meredith Staggers, MD, [facility] 01/17/2016   01/17/2016

## 2016-01-17 NOTE — Progress Notes (Signed)
Nutrition Follow-up  DOCUMENTATION CODES:   Obesity unspecified  INTERVENTION:  Discontinue Prostat.   Provide Glucerna Shake po BID, each supplement provides 220 kcal and 10 grams of protein.  Discharge to CIR today.   NUTRITION DIAGNOSIS:   Inadequate oral intake related to poor appetite as evidenced by estimated needs; improved  GOAL:   Patient will meet greater than or equal to 90% of their needs; progressing  MONITOR:   PO intake, Supplement acceptance, Weight trends, Labs, I & O's, Skin  REASON FOR ASSESSMENT:   Consult Wound healing  ASSESSMENT:   Thomas Wright is a 70 y.o. male with PMH of paroxysmal atrial fibrillation on Eliquis, insulin-dependent diabetes mellitus, hypertension, aortic regurgitation status post valve replacement, and recurrent soft tissue infections who presents to the ED with fevers, chills, right foot wound, and uncontrolled blood sugars.   PROCEDURE (4/28): Transtibial amputation right with VAC placed.  Meal completion has been 50-100% with most intake at 100%. Appetite is good. Pt currently has Prostat ordered and has been refusing them. Pt reports it has been making his gag and feel nauseous. Pt is agreeable to Glucerna instead. RD to modify orders. Pt to be discharged to CIR.   Labs and medications reviewed.   Diet Order:  Diet Carb Modified Fluid consistency:: Thin; Room service appropriate?: Yes Diet - low sodium heart healthy Diet Carb Modified  Skin:   (wound VAC R leg)  Last BM:  5/2  Height:   Ht Readings from Last 1 Encounters:  01/09/16 6' (1.829 m)    Weight:   Wt Readings from Last 1 Encounters:  01/17/16 246 lb 4.8 oz (111.721 kg)    Ideal Body Weight:  80.9 kg  BMI:  Body mass index is 33.4 kg/(m^2).  Estimated Nutritional Needs:   Kcal:  2200-2400  Protein:  110-120 grams  Fluid:  >/= 2L  EDUCATION NEEDS:   No education needs identified at this time  Corrin Parker, MS, RD, LDN Pager #  [phone] After hours/ weekend pager # [phone]

## 2016-01-17 NOTE — H&P (Signed)
Physical Medicine and Rehabilitation Admission H&P    Chief Complaint  Patient presents with  . Hyperglycemia  . Wound Infection  : HPI: Thomas Wright is a 70 y.o. right handed male with history of hypertension, PAF maintained on Eliquis, diabetes mellitus peripheral neuropathy, aortic regurgitation status post valve replacement, chronic combined systolic and diastolic congestive heart failure. Patient lives alone independently with a cane prior to admission. No local family. Mobile home with 9 steps to entry. Presented 01/09/2016 with low-grade fever, tachycardia in the 110s, leukocytosis 28,300, lactic acid 3.58 and gangrenous right foot. X-rays of right foot could not exclude very early changes of osteomyelitis. Patient with elevated creatinine 2.34 from baseline 0.9-1.1. Renal ultrasound no acute abnormalities. Renal service  consulted. Felt AKI in setting of early sepsis presumed ischemic ATN. Conservative care close monitoring of renal function with gentle IV fluids.Persistent leukocytosis. 25,900. MRSA PCR screen negative.Initially placed on vancomycin and cefepime and later simplified to cefepime And completed 01/17/2016. No change With conservative care of gangrenous right foot. 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 Sharol Given. Application of wound VAC. Hospital course pain management. Acute blood loss anemia 10.7 and monitored. Renal function stabilizing creatinine 1.91. Physical and occupational therapy evaluations completed with recommendations of physical medicine rehabilitation consult.Patient was admitted for a comprehensive rehabilitation program  ROS Constitutional: Positive for fever. Negative for chills.  HENT: Negative for hearing loss.  Eyes: Negative for blurred vision and double vision.  Respiratory: Positive for shortness of breath. Negative for cough.  Cardiovascular: Positive for palpitations and leg swelling. Negative for chest pain.  Gastrointestinal:  Positive for constipation. Negative for nausea and vomiting.  Genitourinary: Positive for urgency. Negative for dysuria and hematuria.  Musculoskeletal: Positive for myalgias and joint pain.  Skin: Negative for rash.  Neurological: Positive for weakness. Negative for seizures and headaches.  Psychiatric/Behavioral: Positive for depression.  All other systems reviewed and are negative   Past Medical History  Diagnosis Date  . Hypertension   . PAF (paroxysmal atrial fibrillation) ([place])     a. Postop 2008. b. recurrent afib 08/2014 with LAA clot noted on TEE. EF was down again at 25%. He was anticoagulated with Eliquis and placed on Amiodarone.He ultimately underwent TEE-DCCV 08/2014.  Marland Kitchen Hypertriglyceridemia   . Cardiomyopathy ([place])     EF originally 20%-now 45%, no CAD on cath  . Chronic combined systolic and diastolic CHF (congestive heart failure) ([place])     a. 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 Reported h/o cath in 2008 without significant CAD.  Marland Kitchen Type II diabetes mellitus ([place])   . Arthritis     .  Marland Kitchen Depression     "a little right now" (04/19/2015)  . S/P aortic valve replacement with bioprosthetic valve     a. bioprosthetic AVR in 05/2007 in [place].  . Hyperlipidemia    Past Surgical History  Procedure Laterality Date  . Aortic valve replacement  05/2007    with tissue graft  . I&d extremity Right 05/05/2014    Procedure: IRRIGATION AND DEBRIDEMENT EXTREMITY;  Surgeon: [name], MD;  Location: [place];  Service: Orthopedics;  Laterality: Right;  . Tee without cardioversion N/A 07/23/2014    Procedure: TRANSESOPHAGEAL ECHOCARDIOGRAM (TEE);  Surgeon: Josue Hector, MD;  Location: [facility] ENDOSCOPY;  Service: Cardiovascular;  Laterality: N/A;  . Cardioversion N/A 07/23/2014    Procedure: CARDIOVERSION;  Surgeon: Josue Hector,  MD;  Location: [place] ENDOSCOPY;  Service: Cardiovascular;  Laterality: N/A;  .  Tee without cardioversion N/A 09/01/2014    Procedure: TRANSESOPHAGEAL ECHOCARDIOGRAM (TEE);  Surgeon: [name], MD;  Location: [facility] ENDOSCOPY;  Service: Cardiovascular;  Laterality: N/A;  . Cardioversion N/A 09/01/2014    Procedure: CARDIOVERSION;  Surgeon: [name], MD;  Location: [facility] ENDOSCOPY;  Service: Cardiovascular;  Laterality: N/A;  . Amputation Right 07/29/2014    Procedure: AMPUTATION RIGHT LONG FINGER;  Surgeon: [name], MD;  Location: [place];  Service: Orthopedics;  Laterality: Right;  . Cardiac valve replacement    . Tonsillectomy  1954  . Cardiac catheterization  "several"  . Amputation Right 01/13/2016    Procedure: RIGHT BELOW KNEE AMPUTATION;  Surgeon: Newt Minion, MD;  Location: [place];  Service: Orthopedics;  Laterality: Right;  . Application of wound vac Right 01/13/2016    Procedure: APPLICATION OF WOUND VAC;  Surgeon: Newt Minion, MD;  Location: [place];  Service: Orthopedics;  Laterality: Right;   Family History  Problem Relation Age of Onset  . Colon cancer Father   . Diabetes Father   . Liver disease Brother   . Heart murmur Child   . Congenital heart disease Other   . Diabetes Maternal Grandmother   . Heart disease Maternal Grandfather     A Fib   Social History:  reports that he has never smoked. He has never used smokeless tobacco. He reports that he drinks alcohol. He reports that he does not use illicit drugs. Allergies:  Allergies  Allergen Reactions  . Amoxicill-Clarithro-Omeprazole Diarrhea and Nausea And Vomiting   Medications Prior to Admission  Medication Sig Dispense Refill  . amiodarone (PACERONE) 200 MG tablet Take 200 mg by mouth daily. One daily    . apixaban (ELIQUIS) 5 MG TABS tablet Take 1 tablet (5 mg total) by mouth 2 (two) times daily. 60 tablet 3  . aspirin EC 81 MG tablet Take 1 tablet (81 mg total) by mouth daily.    Marland Kitchen atorvastatin (LIPITOR) 10 MG tablet Take 1 tablet (10 mg total) by mouth every morning. 90 tablet 3  .  carvedilol (COREG) 6.25 MG tablet Take 1 tablet (6.25 mg total) by mouth 2 (two) times daily with a meal. 180 tablet 3  . DHA-EPA-Coenzyme Q10-Vitamin E (CO Q-10 VITAMIN E FISH OIL PO) Take 1,000 mg by mouth 2 (two) times daily.    . furosemide (LASIX) 20 MG tablet Take 1 tablet (20 mg total) by mouth daily. 90 tablet 3  . guaiFENesin-dextromethorphan (ROBITUSSIN DM) 100-10 MG/5ML syrup Take 5 mLs by mouth at bedtime as needed for cough.    . insulin glargine (LANTUS) 100 UNIT/ML injection Inject 0.25 mLs (25 Units total) into the skin at bedtime. 10 mL 0  . metFORMIN (GLUCOPHAGE) 1000 MG tablet Take 1,000 mg by mouth 2 (two) times daily with a meal.    . Multiple Vitamin (MULTIVITAMIN) tablet Take 1 tablet by mouth daily.    . sildenafil (VIAGRA) 100 MG tablet Take 1 tablet (100 mg total) by mouth daily as needed for erectile dysfunction (1 hr prior to sexual activity). 6 tablet 12  . Blood Glucose Monitoring Suppl (ACCU-CHEK NANO SMARTVIEW) w/Device KIT Use to check blood sugar 4 times per day dx code E11.9 1 kit 0  . glucose blood (ACCU-CHEK SMARTVIEW) test strip Use as instructed to check blood sugar 4 times per day dx code E11.9 400 each 1  . Insulin Syringe-Needle U-100 31G X  5/16" 0.3 ML MISC Use 3 per day to inject insulin 100 each 3    Home: Home Living Family/patient expects to be discharged to:: Inpatient rehab Living Arrangements: Alone Available Help at Discharge: Available PRN/intermittently, Friend(s) Type of Home: Mobile home Home Access: Stairs to enter [employer] of Steps: 9 Home Layout: One level Bathroom Shower/Tub: Chiropodist: Standard Home Equipment: Cane - single point, Industrial/product designer History: Prior Function Level of Independence: Independent  Functional Status:  Mobility: Bed Mobility Overal bed mobility: Needs Assistance Bed Mobility: Rolling, Sidelying to Sit Rolling: Modified independent (Device/Increase  time) Sidelying to sit: Modified independent (Device/Increase time) Supine to sit: Supervision Sit to supine: Supervision General bed mobility comments: able to roll onto side with use of rail, and rise to sitting with increased time needed. Transfers Overall transfer level: Needs assistance Transfers: Squat Pivot Transfers Sit to Stand: Min assist, +2 physical assistance, From elevated surface Squat pivot transfers: From elevated surface, Min assist, +2 safety/equipment General transfer comment: able to perform squat pivot with cues for hand placement and anterior translation. assist to clear and shift pelvis with use of pad. performed initiation of transfer 2 additional times to increase pt comfortability with transfer.      ADL: ADL Overall ADL's : Needs assistance/impaired Grooming: Wash/dry face, Sitting, Set up, Supervision/safety (pt washed face sitting EOB) Upper Body Bathing: Set up, Supervision/ safety, Sitting Lower Body Bathing: Set up, Supervison/ safety, Sitting/lateral leans Lower Body Bathing Details (indicate cue type and reason): pt able to reach to wash bottom by leaning side to side and also washed front peri area Upper Body Dressing : Minimal assistance, Sitting Lower Body Dressing: Sitting/lateral leans, Minimal assistance Toilet Transfer Details (indicate cue type and reason): not assessed Functional mobility during ADLs:  (Supervision for bed mobility) General ADL Comments: Educated on LB ADL techniques (leaning, rolling, standing). Educated on desensitization techniques for Rt LE. Discussed tub transfer technique with tub bench.  Explained tub transfer technique using tub bench.   Cognition: Cognition Overall Cognitive Status: Within Functional Limits for tasks assessed Orientation Level: Oriented X4 Cognition Arousal/Alertness: Awake/alert Behavior During Therapy: WFL for tasks assessed/performed Overall Cognitive Status: Within Functional Limits for tasks  assessed  Physical Exam: Blood pressure 139/62, pulse 68, temperature 98.2 F (36.8 C), temperature source Oral, resp. rate 18, height 6' (1.829 m), weight 111.721 kg (246 lb 4.8 oz), SpO2 97 %. Physical Exam Constitutional: He is oriented to person, place, and time. He appears well-developed.  Obese  HENT: oral mucosa pink and moist Head: Normocephalic and atraumatic.  Eyes: Conjunctivae and EOM are normal.  Neck: Normal range of motion. Neck supple. No thyromegaly present.  Cardiovascular: Normal rate and regular rhythm. no murmurs or rubs Respiratory: Effort normal and breath sounds normal. No respiratory distress. No wheezes or rales. GI: Soft. Bowel sounds are normal. He exhibits no distension.  Musculoskeletal: He exhibits tenderness. He exhibits no edema.  Neurological: He is alert and oriented to person, place, and time.  Motor: Bilateral upper extremity 4+/5 proximal to distal Left lower extremity hip flexion, knee extension 4- to 4/5 (pain inhibition), ankle dorsi/plantar flexion 5/5 Right lower extremity hip flexion: 4 -/5 (pain inhibition)  Sensation intact light touch in all limbs.  Skin:  BKA site is dressed with wound VAC in place  Psychiatric: He has a normal mood and affect. His behavior is normal   Results for orders placed or performed during the hospital encounter of 01/09/16 (from the  past 48 hour(s))  Glucose, capillary     Status: Abnormal   Collection Time: 01/15/16 11:31 AM  Result Value Ref Range   Glucose-Capillary 231 (H) 65 - 99 mg/dL   Comment 1 Notify RN   Glucose, capillary     Status: Abnormal   Collection Time: 01/15/16  5:22 PM  Result Value Ref Range   Glucose-Capillary 309 (H) 65 - 99 mg/dL  Glucose, capillary     Status: Abnormal   Collection Time: 01/15/16  9:35 PM  Result Value Ref Range   Glucose-Capillary 168 (H) 65 - 99 mg/dL  CBC with Differential/Platelet     Status: Abnormal   Collection Time: 01/16/16  3:24 AM  Result Value Ref  Range   WBC 14.5 (H) 4.0 - 10.5 K/uL   RBC 3.85 (L) 4.22 - 5.81 MIL/uL   Hemoglobin 10.7 (L) 13.0 - 17.0 g/dL   HCT 32.7 (L) 39.0 - 52.0 %   MCV 84.9 78.0 - 100.0 fL   MCH 27.8 26.0 - 34.0 pg   MCHC 32.7 30.0 - 36.0 g/dL   RDW 14.3 11.5 - 15.5 %   Platelets 338 150 - 400 K/uL   Neutrophils Relative % 67 %   Neutro Abs 9.7 (H) 1.7 - 7.7 K/uL   Lymphocytes Relative 18 %   Lymphs Abs 2.5 0.7 - 4.0 K/uL   Monocytes Relative 13 %   Monocytes Absolute 1.9 (H) 0.1 - 1.0 K/uL   Eosinophils Relative 2 %   Eosinophils Absolute 0.3 0.0 - 0.7 K/uL   Basophils Relative 0 %   Basophils Absolute 0.0 0.0 - 0.1 K/uL  Comprehensive metabolic panel     Status: Abnormal   Collection Time: 01/16/16  3:24 AM  Result Value Ref Range   Sodium 136 135 - 145 mmol/L   Potassium 3.5 3.5 - 5.1 mmol/L   Chloride 106 101 - 111 mmol/L   CO2 23 22 - 32 mmol/L   Glucose, Bld 103 (H) 65 - 99 mg/dL   BUN 21 (H) 6 - 20 mg/dL   Creatinine, Ser 1.91 (H) 0.61 - 1.24 mg/dL   Calcium 7.7 (L) 8.9 - 10.3 mg/dL   Total Protein 6.0 (L) 6.5 - 8.1 g/dL   Albumin 1.5 (L) 3.5 - 5.0 g/dL   AST 22 15 - 41 U/L   ALT 11 (L) 17 - 63 U/L   Alkaline Phosphatase 74 38 - 126 U/L   Total Bilirubin 0.4 0.3 - 1.2 mg/dL   GFR calc non Af Amer 34 (L) >60 mL/min   GFR calc Af Amer 40 (L) >60 mL/min    Comment: (NOTE) The eGFR has been calculated using the CKD EPI equation. This calculation has not been validated in all clinical situations. eGFR's persistently <60 mL/min signify possible Chronic Kidney Disease.    Anion gap 7 5 - 15  Glucose, capillary     Status: None   Collection Time: 01/16/16  6:05 AM  Result Value Ref Range   Glucose-Capillary 77 65 - 99 mg/dL  Glucose, capillary     Status: None   Collection Time: 01/16/16  6:43 AM  Result Value Ref Range   Glucose-Capillary 85 65 - 99 mg/dL  Glucose, capillary     Status: Abnormal   Collection Time: 01/16/16 11:14 AM  Result Value Ref Range   Glucose-Capillary 129 (H)  65 - 99 mg/dL   Comment 1 Notify RN   Glucose, capillary     Status: Abnormal  Collection Time: 01/16/16  4:14 PM  Result Value Ref Range   Glucose-Capillary 138 (H) 65 - 99 mg/dL  Glucose, capillary     Status: Abnormal   Collection Time: 01/16/16  9:29 PM  Result Value Ref Range   Glucose-Capillary 158 (H) 65 - 99 mg/dL  CBC     Status: Abnormal   Collection Time: 01/17/16  5:00 AM  Result Value Ref Range   WBC 14.0 (H) 4.0 - 10.5 K/uL   RBC 4.17 (L) 4.22 - 5.81 MIL/uL   Hemoglobin 11.2 (L) 13.0 - 17.0 g/dL   HCT 35.0 (L) 39.0 - 52.0 %   MCV 83.9 78.0 - 100.0 fL   MCH 26.9 26.0 - 34.0 pg   MCHC 32.0 30.0 - 36.0 g/dL   RDW 14.3 11.5 - 15.5 %   Platelets 407 (H) 150 - 400 K/uL  Renal function panel     Status: Abnormal   Collection Time: 01/17/16  5:00 AM  Result Value Ref Range   Sodium 135 135 - 145 mmol/L   Potassium 3.8 3.5 - 5.1 mmol/L   Chloride 105 101 - 111 mmol/L   CO2 23 22 - 32 mmol/L   Glucose, Bld 167 (H) 65 - 99 mg/dL   BUN 20 6 - 20 mg/dL   Creatinine, Ser 1.89 (H) 0.61 - 1.24 mg/dL   Calcium 7.7 (L) 8.9 - 10.3 mg/dL   Phosphorus 3.0 2.5 - 4.6 mg/dL   Albumin 1.6 (L) 3.5 - 5.0 g/dL   GFR calc non Af Amer 35 (L) >60 mL/min   GFR calc Af Amer 40 (L) >60 mL/min    Comment: (NOTE) The eGFR has been calculated using the CKD EPI equation. This calculation has not been validated in all clinical situations. eGFR's persistently <60 mL/min signify possible Chronic Kidney Disease.    Anion gap 7 5 - 15  Glucose, capillary     Status: Abnormal   Collection Time: 01/17/16  6:28 AM  Result Value Ref Range   Glucose-Capillary 138 (H) 65 - 99 mg/dL   No results found.     Medical Problem List and Plan: 1.  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  DVT Prophylaxis/Anticoagulation: Continue Eliquis as prior to admission. 3. Pain Management: Hydrocodone and  Robaxin as needed. Monitor with increased mobility 4. Diabetes mellitus with peripheral neuropathy. Hemoglobin A1c 12.6. Lantus insulin 22 units daily. Check blood sugars before meals and at bedtime. Diabetic teaching 5. Neuropsych: This patient is capable of making decisions on his own behalf. 6. Skin/Wound Care: Routine skin checks. Wound VAC remain in place 1 week post-op 7. Fluids/Electrolytes/Nutrition: Routine I&O with follow-up chemistries 8.Leukocytosis. Slowly improving. Maxipime completed 01/17/2016 9. Hypertension/PAF. Cardiac rate controlled. Amiodarone 200 mg daily, Coreg 6.25 mg twice a day. 10. Chronic combined systolic and diastolic congestive heart failure. No signs of fluid overload. Weigh patient daily  11. Acute renal insufficiency/presumably ischemic ATN. Renal ultrasound unremarkable. Latest creatinine 1.89. Follow-up chemistries. Renal service is following. 12. Hyperlipidemia. Lipitor   Post Admission Physician Evaluation: 1. Functional deficits secondary  to right BKA/debility. 2. Patient is admitted to receive collaborative, interdisciplinary care between the physiatrist, rehab nursing staff, and therapy team. 3. Patient's level of medical complexity and substantial therapy needs in context of that medical necessity cannot be provided at a lesser intensity of care such as a SNF. 4. Patient has experienced substantial functional loss from his/her baseline which was  documented above under the "Functional History" and "Functional Status" headings.  Judging by the patient's diagnosis, physical exam, and functional history, the patient has potential for functional progress which will result in measurable gains while on inpatient rehab.  These gains will be of substantial and practical use upon discharge  in facilitating mobility and self-care at the household level. 5. Physiatrist will provide 24 hour management of medical needs as well as oversight of the therapy plan/treatment  and provide guidance as appropriate regarding the interaction of the two. 6. 24 hour rehab nursing will assist with bladder management, bowel management, safety, skin/wound care, disease management, medication administration, pain management and patient education  and help integrate therapy concepts, techniques,education, etc. 7. PT will assess and treat for/with: Lower extremity strength, range of motion, stamina, balance, functional mobility, safety, adaptive techniques and equipment, pre-prosthetic education, pain mgt, wound care, vac mgt.   Goals are: mod I. 8. OT will assess and treat for/with: ADL's, functional mobility, safety, upper extremity strength, adaptive techniques and equipment, pain mgt, pre-prosthetic education, ego support.   Goals are: mod I. Therapy may not yet proceed with showering this patient. 9. SLP will assess and treat for/with: n/a.  Goals are: n/a. 10. Case Management and Social Worker will assess and treat for psychological issues and discharge planning. 11. Team conference will be held weekly to assess progress toward goals and to determine barriers to discharge. 12. Patient will receive at least 3 hours of therapy per day at least 5 days per week. 13. ELOS: 7-9 days       14. Prognosis:  excellent     Meredith Staggers, MD, [facility] 01/17/2016   01/17/2016

## 2016-01-17 NOTE — Progress Notes (Signed)
Rehab admissions - I have approval for acute inpatient rehab admission from [facility].  I met with patient and he is agreeable.  Bed available and will admit to acute inpatient rehab today.  Call me for questions.  #[phone]

## 2016-01-17 NOTE — Progress Notes (Signed)
Physical Therapy Treatment Patient Details Name: Thomas Wright MRN: [id number] DOB: 06-Jul-1946 Today's Date: 01/17/2016    History of Present Illness 70 y.o. admitted for Rt BKA. PMHx: Afib, DM, HTN, AVR, CHF, arthritis, HLD, depression, cardiomyopathy.    PT Comments    Pt apprehensive about getting OOB, but with increased confidence at end of session. Pt with increased bed mobility, but still requires +2 assistance with squat pivot transfer. Pt in chair with lift pad at end of session. Reemphasized UE and LE HEP. Will continue to follow.  Follow Up Recommendations  CIR;Supervision for mobility/OOB     Equipment Recommendations       Recommendations for Other Services       Precautions / Restrictions      Mobility  Bed Mobility   Bed Mobility: Rolling;Sidelying to Sit Rolling: Modified independent (Device/Increase time) Sidelying to sit: Modified independent (Device/Increase time)       General bed mobility comments: able to roll onto side with use of rail, and rise to sitting with increased time needed.  Transfers           Squat pivot transfers: From elevated surface;Min assist;+2 safety/equipment     General transfer comment: able to perform squat pivot with cues for hand placement and anterior translation. assist to clear and shift pelvis with use of pad. performed initiation of transfer 2 additional times to increase pt comfortability with transfer.  Ambulation/Gait                 Stairs            Wheelchair Mobility    Modified Rankin (Stroke Patients Only)       Balance                                    Cognition Arousal/Alertness: Awake/alert Behavior During Therapy: WFL for tasks assessed/performed Overall Cognitive Status: Within Functional Limits for tasks assessed                      Exercises General Exercises - Lower Extremity Long Arc Quad: AROM;Left;15 reps;Seated Amputee Exercises Hip  Extension: AROM;Right;15 reps;Sidelying Hip ABduction/ADduction: AROM;Right;15 reps;Supine Knee Extension: AROM;Right;15 reps;Supine Straight Leg Raises: AROM;Left;15 reps;Supine    General Comments        Pertinent Vitals/Pain Pain Assessment: 0-10 Pain Score: 4  Pain Location: right LE Pain Descriptors / Indicators: Aching Pain Intervention(s): Limited activity within patient's tolerance;Monitored during session;Repositioned    Home Living                      Prior Function            PT Goals (current goals can now be found in the care plan section) Progress towards PT goals: Progressing toward goals    Frequency       PT Plan Current plan remains appropriate    Co-evaluation             End of Session Equipment Utilized During Treatment: Gait belt Activity Tolerance: Patient tolerated treatment well Patient left: in chair;with call bell/phone within reach;Other (comment) (with lift pad underneath pt)     Time: [id number] PT Time Calculation (min) (ACUTE ONLY): 27 min  Charges:  $Therapeutic Exercise: 8-22 mins $Therapeutic Activity: 8-22 mins  G CodesHaze Justin 01/17/2016, 9:15 AM   Haze Justin, SPT [phone]

## 2016-01-17 NOTE — Progress Notes (Signed)
Ankit Lorie Phenix, MD Physician Signed Physical Medicine and Rehabilitation Consult Note 01/16/2016 6:07 AM  Related encounter: ED to Hosp-Admission (Current) from 01/09/2016 in [place] Collapse All        Physical Medicine and Rehabilitation Consult Reason for Consult: Right BKA Referring Physician: [facility]   HPI: Thomas Wright is a 70 y.o. right handed male with history of hypertension, PAF maintained on Eliquis, diabetes mellitus peripheral neuropathy, aortic regurgitation status post valve replacement, chronic combined systolic and diastolic congestive heart failure. Patient lives alone independently with a cane prior to admission. No local family. Mobile home with 9 steps to entry. Presented 01/09/2016 with low-grade fever, tachycardia in the 110s, leukocytosis 28,300, lactic acid 3.58 and gangrenous right foot. X-rays of right foot could not exclude very early changes of osteomyelitis. Patient with elevated creatinine 2.34 from baseline 0.9-1.1. Renal ultrasound no acute abnormalities. Renal service is consulted. Felt AKI in setting of early sepsis presumed ischemic ATN. Conservative care close monitoring of renal function with gentle IV fluids. No change in conservative care of gangrenous right foot. 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 Sharol Given. Application of wound VAC. Hospital course pain management. Acute blood loss anemia 10.7 and monitored. Renal function stabilizing creatinine 1.91. Physical and occupational therapy evaluations completed with recommendations of physical medicine rehabilitation consult.   Review of Systems  Constitutional: Positive for fever. Negative for chills.  HENT: Negative for hearing loss.  Eyes: Negative for blurred vision and double vision.  Respiratory: Positive for shortness of breath. Negative for cough.  Cardiovascular: Positive for palpitations and leg swelling. Negative for chest  pain.  Gastrointestinal: Positive for constipation. Negative for nausea and vomiting.  Genitourinary: Positive for urgency. Negative for dysuria and hematuria.  Musculoskeletal: Positive for myalgias and joint pain.  Skin: Negative for rash.  Neurological: Positive for weakness. Negative for seizures and headaches.  Psychiatric/Behavioral: Positive for depression.  All other systems reviewed and are negative.  Past Medical History  Diagnosis Date  . Hypertension   . PAF (paroxysmal atrial fibrillation) ([place])     a. Postop 2008. b. recurrent afib 08/2014 with LAA clot noted on TEE. EF was down again at 25%. He was anticoagulated with Eliquis and placed on Amiodarone.He ultimately underwent TEE-DCCV 08/2014.  Marland Kitchen Hypertriglyceridemia   . Cardiomyopathy ([place])     EF originally 20%-now 45%, no CAD on cath  . Chronic combined systolic and diastolic CHF (congestive heart failure) ([place])     a. 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 Reported h/o cath in 2008 without significant CAD.  Marland Kitchen Type II diabetes mellitus ([place])   . Arthritis     .  Marland Kitchen Depression     "a little right now" (04/19/2015)  . S/P aortic valve replacement with bioprosthetic valve     a. bioprosthetic AVR in 05/2007 in [place].  . Hyperlipidemia    Past Surgical History  Procedure Laterality Date  . Aortic valve replacement  05/2007    with tissue graft  . I&d extremity Right 05/05/2014    Procedure: IRRIGATION AND DEBRIDEMENT EXTREMITY; Surgeon: [name], MD; Location: [place]; Service: Orthopedics; Laterality: Right;  . Tee without cardioversion N/A 07/23/2014    Procedure: TRANSESOPHAGEAL ECHOCARDIOGRAM (TEE); Surgeon: Josue Hector, MD; Location: [facility] ENDOSCOPY; Service: Cardiovascular; Laterality: N/A;  . Cardioversion N/A 07/23/2014    Procedure:  CARDIOVERSION;  Surgeon: Josue Hector, MD; Location: [facility] ENDOSCOPY; Service: Cardiovascular; Laterality: N/A;  . Tee without cardioversion N/A 09/01/2014    Procedure: TRANSESOPHAGEAL ECHOCARDIOGRAM (TEE); Surgeon: [name], MD; Location: [facility] ENDOSCOPY; Service: Cardiovascular; Laterality: N/A;  . Cardioversion N/A 09/01/2014    Procedure: CARDIOVERSION; Surgeon: [name], MD; Location: [facility] ENDOSCOPY; Service: Cardiovascular; Laterality: N/A;  . Amputation Right 07/29/2014    Procedure: AMPUTATION RIGHT LONG FINGER; Surgeon: [name], MD; Location: [place]; Service: Orthopedics; Laterality: Right;  . Cardiac valve replacement    . Tonsillectomy  1954  . Cardiac catheterization  "several"   Family History  Problem Relation Age of Onset  . Colon cancer Father   . Diabetes Father   . Liver disease Brother   . Heart murmur Child   . Congenital heart disease Other   . Diabetes Maternal Grandmother   . Heart disease Maternal Grandfather     A Fib   Social History:  reports that he has never smoked. He has never used smokeless tobacco. He reports that he drinks alcohol. He reports that he does not use illicit drugs. Allergies:  Allergies  Allergen Reactions  . Amoxicill-Clarithro-Omeprazole Diarrhea and Nausea And Vomiting   Medications Prior to Admission  Medication Sig Dispense Refill  . amiodarone (PACERONE) 200 MG tablet Take 200 mg by mouth daily. One daily    . apixaban (ELIQUIS) 5 MG TABS tablet Take 1 tablet (5 mg total) by mouth 2 (two) times daily. 60 tablet 3  . aspirin EC 81 MG tablet Take 1 tablet (81 mg total) by mouth daily.    Marland Kitchen atorvastatin (LIPITOR) 10 MG tablet Take 1 tablet (10 mg total) by mouth every morning. 90 tablet 3  . carvedilol (COREG) 6.25 MG tablet Take 1 tablet (6.25 mg total) by mouth 2 (two) times daily with a meal. 180 tablet  3  . DHA-EPA-Coenzyme Q10-Vitamin E (CO Q-10 VITAMIN E FISH OIL PO) Take 1,000 mg by mouth 2 (two) times daily.    . furosemide (LASIX) 20 MG tablet Take 1 tablet (20 mg total) by mouth daily. 90 tablet 3  . guaiFENesin-dextromethorphan (ROBITUSSIN DM) 100-10 MG/5ML syrup Take 5 mLs by mouth at bedtime as needed for cough.    . insulin glargine (LANTUS) 100 UNIT/ML injection Inject 0.25 mLs (25 Units total) into the skin at bedtime. 10 mL 0  . metFORMIN (GLUCOPHAGE) 1000 MG tablet Take 1,000 mg by mouth 2 (two) times daily with a meal.    . Multiple Vitamin (MULTIVITAMIN) tablet Take 1 tablet by mouth daily.    . sildenafil (VIAGRA) 100 MG tablet Take 1 tablet (100 mg total) by mouth daily as needed for erectile dysfunction (1 hr prior to sexual activity). 6 tablet 12  . Blood Glucose Monitoring Suppl (ACCU-CHEK NANO SMARTVIEW) w/Device KIT Use to check blood sugar 4 times per day dx code E11.9 1 kit 0  . glucose blood (ACCU-CHEK SMARTVIEW) test strip Use as instructed to check blood sugar 4 times per day dx code E11.9 400 each 1  . Insulin Syringe-Needle U-100 31G X 5/16" 0.3 ML MISC Use 3 per day to inject insulin 100 each 3    Home: Home Living Family/patient expects to be discharged to:: Inpatient rehab Living Arrangements: Alone Available Help at Discharge: Available PRN/intermittently, Friend(s) Type of Home: Mobile home Home Access: Stairs to enter [employer] of Steps: 9 Home Layout: One level Bathroom Shower/Tub: Chiropodist: Standard Home Equipment: Cane - single point, Civil engineer, contracting  Functional History: Prior Function Level of Independence: Independent Functional Status:  Mobility: Bed Mobility Overal bed mobility: Needs Assistance Bed Mobility: Supine to Sit, Sit to Supine Rolling: Supervision Sidelying to sit: Supervision Supine to sit: Supervision Sit to supine: Supervision General bed  mobility comments: cues for sequence with use of rail  Transfers Overall transfer level: Needs assistance Transfers: Sit to/from Stand Sit to Stand: Min assist, +2 safety/equipment, From elevated surface General transfer comment: not assessed-pt felt woozy       ADL: ADL Overall ADL's : Needs assistance/impaired Grooming: Wash/dry face, Sitting, Set up, Supervision/safety (pt washed face sitting EOB) Upper Body Bathing: Set up, Supervision/ safety, Sitting Lower Body Bathing: Set up, Supervison/ safety, Sitting/lateral leans Lower Body Bathing Details (indicate cue type and reason): pt able to reach to wash bottom by leaning side to side and also washed front peri area Upper Body Dressing : Minimal assistance, Sitting Lower Body Dressing: Sitting/lateral leans, Minimal assistance Toilet Transfer Details (indicate cue type and reason): not assessed Functional mobility during ADLs: (Supervision for bed mobility) General ADL Comments: Educated on LB ADL techniques (leaning, rolling, standing). Educated on desensitization techniques for Rt LE. Discussed tub transfer technique with tub bench. Explained tub transfer technique using tub bench.   Cognition: Cognition Overall Cognitive Status: No family/caregiver present to determine baseline cognitive functioning (decreased short term memory) Orientation Level: Oriented X4 Cognition Arousal/Alertness: Awake/alert Behavior During Therapy: WFL for tasks assessed/performed Overall Cognitive Status: No family/caregiver present to determine baseline cognitive functioning (decreased short term memory)  Blood pressure 140/63, pulse 67, temperature 98.2 F (36.8 C), temperature source Oral, resp. rate 16, height 6' (1.829 m), weight 0.003 kg (0.1 oz), SpO2 95 %. Physical Exam  Constitutional: He is oriented to person, place, and time. He appears well-developed.  Obese  HENT:  Head: Normocephalic and atraumatic.  Eyes: Conjunctivae and EOM  are normal.  Neck: Normal range of motion. Neck supple. No thyromegaly present.  Cardiovascular: Normal rate and regular rhythm.  Respiratory: Effort normal and breath sounds normal. No respiratory distress.  GI: Soft. Bowel sounds are normal. He exhibits no distension.  Musculoskeletal: He exhibits tenderness. He exhibits no edema.  Neurological: He is alert and oriented to person, place, and time.  Motor: Bilateral upper extremity 4+/5 proximal to distal Left lower extremity hip flexion, knee extension 4/5 (pain inhibition), ankle dorsi/plantar flexion 5/5 Right lower extremity hip flexion: 4 -/5 (pain inhibition)  Sensation intact light touch  Skin:  BKA site is dressed appropriately tender with wound VAC in place  Psychiatric: He has a normal mood and affect. His behavior is normal.     Lab Results Last 24 Hours    Results for orders placed or performed during the hospital encounter of 01/09/16 (from the past 24 hour(s))  Glucose, capillary Status: Abnormal   Collection Time: 01/15/16 6:31 AM  Result Value Ref Range   Glucose-Capillary 163 (H) 65 - 99 mg/dL  CBC Status: Abnormal   Collection Time: 01/15/16 8:00 AM  Result Value Ref Range   WBC 16.6 (H) 4.0 - 10.5 K/uL   RBC 4.43 4.22 - 5.81 MIL/uL   Hemoglobin 12.2 (L) 13.0 - 17.0 g/dL   HCT 37.5 (L) 39.0 - 52.0 %   MCV 84.7 78.0 - 100.0 fL   MCH 27.5 26.0 - 34.0 pg   MCHC 32.5 30.0 - 36.0 g/dL   RDW 14.2 11.5 - 15.5 %   Platelets 365 150 - 400 K/uL  Renal function panel Status: Abnormal  Collection Time: 01/15/16 8:00 AM  Result Value Ref Range   Sodium 135 135 - 145 mmol/L   Potassium 3.5 3.5 - 5.1 mmol/L   Chloride 105 101 - 111 mmol/L   CO2 25 22 - 32 mmol/L   Glucose, Bld 223 (H) 65 - 99 mg/dL   BUN 23 (H) 6 - 20 mg/dL   Creatinine, Ser 2.16 (H) 0.61 - 1.24 mg/dL   Calcium 7.9 (L) 8.9 - 10.3 mg/dL    Phosphorus 3.1 2.5 - 4.6 mg/dL   Albumin 1.6 (L) 3.5 - 5.0 g/dL   GFR calc non Af Amer 30 (L) >60 mL/min   GFR calc Af Amer 34 (L) >60 mL/min   Anion gap 5 5 - 15  Renal function panel Status: Abnormal   Collection Time: 01/15/16 8:00 AM  Result Value Ref Range   Sodium 135 135 - 145 mmol/L   Potassium 3.5 3.5 - 5.1 mmol/L   Chloride 104 101 - 111 mmol/L   CO2 25 22 - 32 mmol/L   Glucose, Bld 222 (H) 65 - 99 mg/dL   BUN 24 (H) 6 - 20 mg/dL   Creatinine, Ser 2.13 (H) 0.61 - 1.24 mg/dL   Calcium 7.8 (L) 8.9 - 10.3 mg/dL   Phosphorus 3.0 2.5 - 4.6 mg/dL   Albumin 1.6 (L) 3.5 - 5.0 g/dL   GFR calc non Af Amer 30 (L) >60 mL/min   GFR calc Af Amer 35 (L) >60 mL/min   Anion gap 6 5 - 15  Glucose, capillary Status: Abnormal   Collection Time: 01/15/16 11:31 AM  Result Value Ref Range   Glucose-Capillary 231 (H) 65 - 99 mg/dL   Comment 1 Notify RN   Glucose, capillary Status: Abnormal   Collection Time: 01/15/16 5:22 PM  Result Value Ref Range   Glucose-Capillary 309 (H) 65 - 99 mg/dL  Glucose, capillary Status: Abnormal   Collection Time: 01/15/16 9:35 PM  Result Value Ref Range   Glucose-Capillary 168 (H) 65 - 99 mg/dL  CBC with Differential/Platelet Status: Abnormal   Collection Time: 01/16/16 3:24 AM  Result Value Ref Range   WBC 14.5 (H) 4.0 - 10.5 K/uL   RBC 3.85 (L) 4.22 - 5.81 MIL/uL   Hemoglobin 10.7 (L) 13.0 - 17.0 g/dL   HCT 32.7 (L) 39.0 - 52.0 %   MCV 84.9 78.0 - 100.0 fL   MCH 27.8 26.0 - 34.0 pg   MCHC 32.7 30.0 - 36.0 g/dL   RDW 14.3 11.5 - 15.5 %   Platelets 338 150 - 400 K/uL   Neutrophils Relative % 67 %   Neutro Abs 9.7 (H) 1.7 - 7.7 K/uL   Lymphocytes Relative 18 %   Lymphs Abs 2.5 0.7 - 4.0 K/uL   Monocytes Relative 13 %   Monocytes Absolute 1.9 (H) 0.1 -  1.0 K/uL   Eosinophils Relative 2 %   Eosinophils Absolute 0.3 0.0 - 0.7 K/uL   Basophils Relative 0 %   Basophils Absolute 0.0 0.0 - 0.1 K/uL  Comprehensive metabolic panel Status: Abnormal   Collection Time: 01/16/16 3:24 AM  Result Value Ref Range   Sodium 136 135 - 145 mmol/L   Potassium 3.5 3.5 - 5.1 mmol/L   Chloride 106 101 - 111 mmol/L   CO2 23 22 - 32 mmol/L   Glucose, Bld 103 (H) 65 - 99 mg/dL   BUN 21 (H) 6 - 20 mg/dL   Creatinine, Ser 1.91 (H) 0.61 - 1.24 mg/dL  Calcium 7.7 (L) 8.9 - 10.3 mg/dL   Total Protein 6.0 (L) 6.5 - 8.1 g/dL   Albumin 1.5 (L) 3.5 - 5.0 g/dL   AST 22 15 - 41 U/L   ALT 11 (L) 17 - 63 U/L   Alkaline Phosphatase 74 38 - 126 U/L   Total Bilirubin 0.4 0.3 - 1.2 mg/dL   GFR calc non Af Amer 34 (L) >60 mL/min   GFR calc Af Amer 40 (L) >60 mL/min   Anion gap 7 5 - 15  Glucose, capillary Status: None   Collection Time: 01/16/16 6:05 AM  Result Value Ref Range   Glucose-Capillary 77 65 - 99 mg/dL      Imaging Results (Last 48 hours)    No results found.    Assessment/Plan: Diagnosis: Right BKA Labs and images independently reviewed. Records reviewed and summated above. PT/OT for mobility, ADL's, strengthening,and wheelchair training Clean amputation daily with soap and water After removal of VAC Monitor incision site for signs of infection or impending skin breakdown. Staples to remain in place for 3-4 weeks Stump shrinker, for edema control after removal of VAC Scar mobilization massaging to prevent soft tissue adherence Stump protector during therapies Prevent flexion contractures by implementing the following:  Encourage prone lying for 20-30 mins per day BID to avoid hip flexion Contractures if medically appropriate; Avoid pillow under knees when patient is lying in bed in order to prevent  both knee and hip flexion contractures; Avoid prolonged sitting Post surgical pain control with oral medication Phantom limb pain control with physical modalities including desensitization techniques (gentle self massage to the residual stump,hot packs if sensation iintact, [place]) and mirror therapy, TENS. If ineffective, consider pharmacological treatment for neuropathic pain (e.g gabapentin, pregabalin, amytriptalyine, duloxetine).  When using wheelchair, patient should have knee on amputated side fully extended with board under the seat cushion. Avoid injury to contralateral side  1. Does the need for close, 24 hr/day medical supervision in concert with the patient's rehab needs make it unreasonable for this patient to be served in a less intensive setting? Yes  2. Co-Morbidities requiring supervision/potential complications: HTN (monitor and provide prns in accordance with increased physical exertion and pain), PAF (cont meds, monitor HR with increased activity), diabetes mellitus peripheral neuropathy (Monitor in accordance with exercise and adjust meds as necessary), aortic regurgitation status post valve replacement (Monitor in accordance with increased physical activity and avoid UE resistance excercises), chronic combined systolic and diastolic congestive heart failure (see above), ATN (avoid nephrotoxic meds, ensure adequate hydration), post-op pain (Biofeedback training with therapies to help reduce reliance on opiate pain medications, monitor pain control during therapies, and sedation at rest and titrate to maximum efficacy to ensure participation and gains in therapies), Acute blood loss anemia (transfuse if necessary to ensure appropriate perfusion for increased activity tolerance), leukocytosis (cont to monitor for signs and symptoms of infection, further workup if indicated), Obesity (encourage weight loss) 3. Due to safety, skin/wound care, disease management, pain  management and patient education, does the patient require 24 hr/day rehab nursing? Yes 4. Does the patient require coordinated care of a physician, rehab nurse, PT (1-2 hrs/day, 5 days/week) and OT (1-2 hrs/day, 5 days/week) to address physical and functional deficits in the context of the above medical diagnosis(es)? Yes Addressing deficits in the following areas: balance, endurance, locomotion, strength, transferring, bathing, dressing, toileting and psychosocial support 5. Can the patient actively participate in an intensive therapy program of at least 3 hrs of therapy per day  at least 5 days per week? Yes 6. The potential for patient to make measurable gains while on inpatient rehab is excellent 7. Anticipated functional outcomes upon discharge from inpatient rehab are modified independent with PT, modified independent with OT, n/a with SLP. 8. Estimated rehab length of stay to reach the above functional goals is: 8-12 days. 9. Does the patient have adequate social supports and living environment to accommodate these discharge functional goals? Yes 10. Anticipated D/C setting: Home 11. Anticipated post D/C treatments: HH therapy and Home excercise program 12. Overall Rehab/Functional Prognosis: good  RECOMMENDATIONS: This patient's condition is appropriate for continued rehabilitative care in the following setting: Possibly CIR, Will need to inquire about the potential for accommodations at home as patient has 9 steps to enter and lives alone. Patient has agreed to participate in recommended program. Yes Note that insurance prior authorization may be required for reimbursement for recommended care.  Comment: Rehab Admissions Coordinator to follow up.  Delice Lesch, MD 01/16/2016       Revision History     Date/Time User Provider Type Action   01/16/2016 1:13 PM Ankit Lorie Phenix, MD Physician Sign   01/16/2016 6:41 AM Cathlyn Parsons, PA-C Physician Assistant Pend   View Details  Report       Routing History     Date/Time From To Method   01/16/2016 1:13 PM Ankit Lorie Phenix, MD Leighton Ruff, MD Fax

## 2016-01-17 NOTE — Interval H&P Note (Signed)
Thomas Wright was admitted today to Inpatient Rehabilitation with the diagnosis of right BKA.  The patient's history has been reviewed, patient examined, and there is no change in status.  Patient continues to be appropriate for intensive inpatient rehabilitation.  I have reviewed the patient's chart and labs.  Questions were answered to the patient's satisfaction. The PAPE has been reviewed and assessment remains appropriate.  Kristy Catoe T 01/17/2016, 5:10 PM

## 2016-01-17 NOTE — PMR Pre-admission (Signed)
PMR Admission Coordinator Pre-Admission Assessment  Patient: Thomas Wright is an 70 y.o., male MRN: [id number] DOB: 1946/08/10 Height: 6' (182.9 cm) Weight: 111.721 kg (246 lb 4.8 oz)              Insurance Information HMO: Yes    PPO:       PCP:       IPA:       80/20:       OTHER:  Group 3 [id number] PRIMARY: [facility] [facility] [employer]      Policy#: [id number]      Subscriber: Cecil Cobbs CM Name: Kathryne Hitch      Phone#: [phone]     Fax#: [phone] Pre-Cert#: [id number]  For 7 days 05/02 to 01/23/16  With update due 01/23/16      Employer: Retired, but works PT/FT Benefits:  Phone #: [phone]     Name: Ernie Hew. Date: 09/17/13     Deduct: $0      Out of Pocket Max: $5900 (met $67.50)      Life Max: unlimited CIR: $295 days 1-6      SNF:  $0 days 1-20; $160 days 21-100 Outpatient: Medical necessity     Co-Pay: $10 copay Home Health: 100%      Co-Pay: none DME: 80%     Co-Pay: 20% Providers: in network  Medicaid Application Date:        Case Manager:   Disability Application Date:        Case Worker:    Emergency Facilities manager Information    Name Relation Home Work [place]  [phone]   Nigel Berthold [phone]  [phone]     Current Medical History  Patient Admitting Diagnosis:  R BKA  History of Present Illness: A 70 y.o. right handed male with history of hypertension, PAF maintained on Eliquis, diabetes mellitus peripheral neuropathy, aortic regurgitation status post valve replacement, chronic combined systolic and diastolic congestive heart failure. Patient lives alone independently with a cane prior to admission. No local family. Mobile home with 9 steps to entry. Presented 01/09/2016 with low-grade fever, tachycardia in the 110s, leukocytosis 28,300, lactic acid 3.58 and gangrenous right foot. X-rays of right foot could not exclude very early changes of osteomyelitis. Patient with elevated creatinine  2.34 from baseline 0.9-1.1. Renal ultrasound no acute abnormalities. Renal service consulted. Felt AKI in setting of early sepsis presumed ischemic ATN. Conservative care close monitoring of renal function with gentle IV fluids.Persistent leukocytosis. 25,900. MRSA PCR screen negative.Initially placed on vancomycin and cefepime and later simplified to cefepime And completed 01/17/2016. No change With conservative care of gangrenous right foot. 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 Sharol Given. Application of wound VAC. Hospital course pain management. Acute blood loss anemia 10.7 and monitored. Renal function stabilizing creatinine 1.91. Physical and occupational therapy evaluations completed with recommendations of physical medicine rehabilitation consult. Patient to be admitted for a comprehensive inpatient rehabilitation program.    Past Medical History  Past Medical History  Diagnosis Date  . Hypertension   . PAF (paroxysmal atrial fibrillation) ([place])     a. Postop 2008. b. recurrent afib 08/2014 with LAA clot noted on TEE. EF was down again at 25%. He was anticoagulated with Eliquis and placed on Amiodarone.He ultimately underwent TEE-DCCV 08/2014.  Marland Kitchen Hypertriglyceridemia   . Cardiomyopathy ([place])     EF originally 20%-now 45%, no CAD on cath  . Chronic combined systolic and diastolic CHF (congestive heart failure) (  [place])     a. 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 Reported h/o cath in 2008 without significant CAD.  Marland Kitchen Type II diabetes mellitus ([place])   . Arthritis     .  Marland Kitchen Depression     "a little right now" (04/19/2015)  . S/P aortic valve replacement with bioprosthetic valve     a. bioprosthetic AVR in 05/2007 in [place].  . Hyperlipidemia     Family History  family history includes Colon cancer in his father; Congenital heart disease in his other; Diabetes in his father and maternal grandmother;  Heart disease in his maternal grandfather; Heart murmur in his child; Liver disease in his brother.  Prior Rehab/Hospitalizations: Had HHPT in 2009 after AVR and again in 2015 after a blood clot.  Has the patient had major surgery during 100 days prior to admission? No  Current Medications   Current facility-administered medications:  .  acetaminophen (TYLENOL) tablet 650 mg, 650 mg, Oral, Q6H PRN **OR** acetaminophen (TYLENOL) suppository 650 mg, 650 mg, Rectal, Q6H PRN, [name] V, MD .  amiodarone (PACERONE) tablet 200 mg, 200 mg, Oral, Daily, Vianne Bulls, MD, 200 mg at 01/17/16 0923 .  apixaban (ELIQUIS) tablet 5 mg, 5 mg, Oral, BID, Rebecka Apley, RPH, 5 mg at 01/17/16 7915 .  atorvastatin (LIPITOR) tablet 10 mg, 10 mg, Oral, q morning - 10a, Ilene Qua Opyd, MD, 10 mg at 01/17/16 0923 .  carvedilol (COREG) tablet 6.25 mg, 6.25 mg, Oral, BID WC, Ilene Qua Opyd, MD, 6.25 mg at 01/17/16 0923 .  Chlorhexidine Gluconate Cloth 2 % PADS 6 each, 6 each, Topical, Daily, Lavina Hamman, MD, 6 each at 01/17/16 1147 .  feeding supplement (PRO-STAT SUGAR FREE 64) liquid 30 mL, 30 mL, Oral, BID, Lavina Hamman, MD, 30 mL at 01/15/16 1126 .  HYDROcodone-acetaminophen (NORCO/VICODIN) 5-325 MG per tablet 1-2 tablet, 1-2 tablet, Oral, Q4H PRN, Vianne Bulls, MD, 2 tablet at 01/17/16 0557 .  insulin aspart (novoLOG) injection 0-15 Units, 0-15 Units, Subcutaneous, TID WC, Lavina Hamman, MD, 5 Units at 01/17/16 1238 .  insulin aspart (novoLOG) injection 0-5 Units, 0-5 Units, Subcutaneous, QHS, Lavina Hamman, MD, 3 Units at 01/14/16 2200 .  insulin glargine (LANTUS) injection 22 Units, 22 Units, Subcutaneous, QHS, Lavina Hamman, MD, 22 Units at 01/16/16 2253 .  methocarbamol (ROBAXIN) tablet 500 mg, 500 mg, Oral, Q6H PRN, 500 mg at 01/17/16 0557 **OR** methocarbamol (ROBAXIN) 500 mg in dextrose 5 % 50 mL IVPB, 500 mg, Intravenous, Q6H PRN, [name] V, MD .  metoCLOPramide (REGLAN) tablet 5-10 mg,  5-10 mg, Oral, Q8H PRN **OR** metoCLOPramide (REGLAN) injection 5-10 mg, 5-10 mg, Intravenous, Q8H PRN, [name] V, MD .  multivitamin with minerals tablet 1 tablet, 1 tablet, Oral, Daily, Vianne Bulls, MD, 1 tablet at 01/17/16 [phone] .  mupirocin ointment (BACTROBAN) 2 % 1 application, 1 application, Nasal, BID, Lavina Hamman, MD, 1 application at 79/48/01 1151 .  ondansetron (ZOFRAN) tablet 4 mg, 4 mg, Oral, Q6H PRN **OR** ondansetron (ZOFRAN) injection 4 mg, 4 mg, Intravenous, Q6H PRN, [name] V, MD, 4 mg at 01/16/16 0555 .  saccharomyces boulardii (FLORASTOR) capsule 250 mg, 250 mg, Oral, BID, Lavina Hamman, MD, 250 mg at 01/17/16 1238 .  sodium chloride flush (NS) 0.9 % injection 10-40 mL, 10-40 mL, Intracatheter, PRN, Nishant Dhungel, MD, 10 mL  at 01/11/16 0420 .  sodium chloride flush (NS) 0.9 % injection 10-40 mL, 10-40 mL, Intracatheter, PRN, Lavina Hamman, MD, 20 mL at 01/14/16 0109  Patients Current Diet: Diet Carb Modified Fluid consistency:: Thin; Room service appropriate?: Yes Diet - low sodium heart healthy Diet Carb Modified  Precautions / Restrictions Precautions Precautions: Fall Restrictions Weight Bearing Restrictions: Yes RLE Weight Bearing: Non weight bearing   Has the patient had 2 or more falls or a fall with injury in the past year?No.  Patient reports 1 fall in the past year with no injury.  Prior Activity Level Community (5-7x/wk): Went out daily.  Was driving.  Worked 4-5 days a week FT at a [employer].  Home Assistive Devices / Equipment Home Equipment: Cane - single point, Shower seat  Prior Device Use: Indicate devices/aids used by the patient prior to current illness, exacerbation or injury? straignt cane, but seldom used.  Prior Functional Level Prior Function Level of Independence: Independent  Self Care: Did the patient need help bathing, dressing, using the toilet or eating?  Independent  Indoor Mobility: Did the patient need  assistance with walking from room to room (with or without device)? Independent  Stairs: Did the patient need assistance with internal or external stairs (with or without device)? Independent  Functional Cognition: Did the patient need help planning regular tasks such as shopping or remembering to take medications? Independent  Current Functional Level Cognition  Overall Cognitive Status: Within Functional Limits for tasks assessed Orientation Level: Oriented X4    Extremity Assessment (includes Sensation/Coordination)  Upper Extremity Assessment: Generalized weakness (decreased fine motor coordination in left and right)  Lower Extremity Assessment: Defer to PT evaluation RLE Deficits / Details: could not feel when OT touching Rt LE    ADLs  Overall ADL's : Needs assistance/impaired Grooming: Wash/dry face, Sitting, Set up, Supervision/safety (pt washed face sitting EOB) Upper Body Bathing: Set up, Supervision/ safety, Sitting Lower Body Bathing: Set up, Supervison/ safety, Sitting/lateral leans Lower Body Bathing Details (indicate cue type and reason): pt able to reach to wash bottom by leaning side to side and also washed front peri area Upper Body Dressing : Minimal assistance, Sitting Lower Body Dressing: Sitting/lateral leans, Minimal assistance Toilet Transfer Details (indicate cue type and reason): not assessed Functional mobility during ADLs:  (Supervision for bed mobility) General ADL Comments: Educated on LB ADL techniques (leaning, rolling, standing). Educated on desensitization techniques for Rt LE. Discussed tub transfer technique with tub bench.  Explained tub transfer technique using tub bench.     Mobility  Overal bed mobility: Needs Assistance Bed Mobility: Rolling, Sidelying to Sit Rolling: Modified independent (Device/Increase time) Sidelying to sit: Modified independent (Device/Increase time) Supine to sit: Supervision Sit to supine: Supervision General bed  mobility comments: able to roll onto side with use of rail, and rise to sitting with increased time needed.    Transfers  Overall transfer level: Needs assistance Transfers: Squat Pivot Transfers Sit to Stand: Min assist, +2 physical assistance, From elevated surface Squat pivot transfers: From elevated surface, Min assist, +2 safety/equipment General transfer comment: able to perform squat pivot with cues for hand placement and anterior translation. assist to clear and shift pelvis with use of pad. performed initiation of transfer 2 additional times to increase pt comfortability with transfer.    Ambulation / Gait / Stairs / Office manager / Balance Balance Overall balance assessment: Needs assistance Sitting balance-Leahy Scale: Good Standing balance-Leahy Scale:  Poor    Special needs/care consideration BiPAP/CPAP No CPM No Continuous Drip IV No  Dialysis No       Life Vest No Oxygen No Special Bed No Trach Size No Wound Vac (area) Yes, to right BKA incisional site.      Skin R BKA surgical incision site                            Bowel mgmt: Last BM 01/17/16, loose stool X 3 today Bladder mgmt: Voiding in the urinal Diabetic mgmt Yes, on oral medication and insulin at home    Previous Home Environment Living Arrangements: Alone Available Help at Discharge: Available PRN/intermittently, Friend(s) Type of Home: Mobile home Home Layout: One level Home Access: Stairs to enter Technical brewer of Steps: 9 Bathroom Shower/Tub: Chiropodist: Standard  Discharge Living Setting Plans for Discharge Living Setting: Alone, Mobile Home (Lives alone) Type of Home at Discharge: Mobile home (single wide mobile home.) Discharge Home Layout: One level Discharge Home Access: Stairs to enter Entrance Stairs-Number of Steps: 6 steps at back and 9 steps at the front.  Patient looking into having a ramp built.  Social/Family/Support  Systems Patient Roles: Parent, Other (Comment) (Has 2 sons who live 6 hrs away.  Has friends.) Contact Information: Leighton Parody - friend [phone] Anticipated Caregiver: self Ability/Limitations of Caregiver: Despina Hick lives in [place] and works at [employer]. Caregiver Availability: Intermittent Discharge Plan Discussed with Primary Caregiver: Yes Is Caregiver In Agreement with Plan?: Yes Does Caregiver/Family have Issues with Lodging/Transportation while Pt is in Rehab?: No  Goals/Additional Needs Patient/Family Goal for Rehab: PT/OT mod I goals Expected length of stay: 8-12 days Cultural Considerations: [employer] Dietary Needs: Carb mod, med cal, thin liquids Equipment Needs: TBD Pt/Family Agrees to Admission and willing to participate: Yes Program Orientation Provided & Reviewed with Pt/Caregiver Including Roles  & Responsibilities: Yes  Decrease burden of Care through IP rehab admission: N/A  Possible need for SNF placement upon discharge: Not planned  Patient Condition: This patient's condition remains as documented in the consult dated 01/16/16, in which the Rehabilitation Physician determined and documented that the patient's condition is appropriate for intensive rehabilitative care in an inpatient rehabilitation facility pending accommodations as home has 6-9 steps. These areas have been addressed. Patient has called and is trying to arrange for a ramp to be built.  Will admit to inpatient rehab today.  Preadmission Screen Completed By:  Retta Diones, 01/17/2016 2:01 PM ______________________________________________________________________   Discussed status with Dr. Naaman Plummer on 01/17/16 at 1400 and received telephone approval for admission today.  Admission Coordinator:  Retta Diones, time1400/Date05/02/17

## 2016-01-17 NOTE — Progress Notes (Signed)
Retta Diones, RN Rehab Admission Coordinator Signed Physical Medicine and Rehabilitation PMR Pre-admission 01/17/2016 1:27 PM  Related encounter: ED to Hosp-Admission (Current) from 01/09/2016 in [place] Collapse All   PMR Admission Coordinator Pre-Admission Assessment  Patient: Thomas Wright is an 70 y.o., male MRN: [id number] DOB: 09-11-1946 Height: 6' (182.9 cm) Weight: 111.721 kg (246 lb 4.8 oz)  Insurance Information HMO: Yes PPO: PCP: IPA: 80/20: OTHER: Group 3 [id number] PRIMARY: [employer] Policy#: [id number] Subscriber: Cecil Cobbs CM Name: Kathryne Hitch Phone#: [phone] Fax#: [phone] Pre-Cert#: [id number] For 7 days 05/02 to 01/23/16 With update due 01/23/16 Employer: Retired, but works PT/FT Benefits: Phone #: [phone] Name: Ernie Hew. Date: 09/17/13 Deduct: $0 Out of Pocket Max: $5900 (met $67.50) Life Max: unlimited CIR: $295 days 1-6 SNF: $0 days 1-20; $160 days 21-100 Outpatient: Medical necessity Co-Pay: $10 copay Home Health: 100% Co-Pay: none DME: 80% Co-Pay: 20% Providers: in network  Medicaid Application Date: Case Manager:  Disability Application Date: Case Worker:   Emergency Facilities manager Information    Name Relation Home Work [place]  [phone]   Nigel Berthold [phone]  [phone]     Current Medical History  Patient Admitting Diagnosis: R BKA  History of Present Illness: A 70 y.o. right handed male with history of hypertension, PAF maintained on Eliquis, diabetes mellitus peripheral neuropathy, aortic regurgitation status post valve  replacement, chronic combined systolic and diastolic congestive heart failure. Patient lives alone independently with a cane prior to admission. No local family. Mobile home with 9 steps to entry. Presented 01/09/2016 with low-grade fever, tachycardia in the 110s, leukocytosis 28,300, lactic acid 3.58 and gangrenous right foot. X-rays of right foot could not exclude very early changes of osteomyelitis. Patient with elevated creatinine 2.34 from baseline 0.9-1.1. Renal ultrasound no acute abnormalities. Renal service consulted. Felt AKI in setting of early sepsis presumed ischemic ATN. Conservative care close monitoring of renal function with gentle IV fluids.Persistent leukocytosis. 25,900. MRSA PCR screen negative.Initially placed on vancomycin and cefepime and later simplified to cefepime And completed 01/17/2016. No change With conservative care of gangrenous right foot. 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 Sharol Given. Application of wound VAC. Hospital course pain management. Acute blood loss anemia 10.7 and monitored. Renal function stabilizing creatinine 1.91. Physical and occupational therapy evaluations completed with recommendations of physical medicine rehabilitation consult. Patient to be admitted for a comprehensive inpatient rehabilitation program.   Past Medical History  Past Medical History  Diagnosis Date  . Hypertension   . PAF (paroxysmal atrial fibrillation) ([place])     a. Postop 2008. b. recurrent afib 08/2014 with LAA clot noted on TEE. EF was down again at 25%. He was anticoagulated with Eliquis and placed on Amiodarone.He ultimately underwent TEE-DCCV 08/2014.  Marland Kitchen Hypertriglyceridemia   . Cardiomyopathy ([place])     EF originally 20%-now 45%, no CAD on cath  . Chronic combined systolic and diastolic CHF (congestive heart failure) ([place])     a. 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 Reported h/o cath in 2008 without significant CAD.  Marland Kitchen Type II diabetes mellitus ([place])   . Arthritis     .  Marland Kitchen Depression     "a little right now" (04/19/2015)  . S/P aortic valve replacement with bioprosthetic valve     a.  bioprosthetic AVR in 05/2007 in [place].  . Hyperlipidemia     Family History  family history includes Colon cancer in his father; Congenital heart disease in his other; Diabetes in his father and maternal grandmother; Heart disease in his maternal grandfather; Heart murmur in his child; Liver disease in his brother.  Prior Rehab/Hospitalizations: Had HHPT in 2009 after AVR and again in 2015 after a blood clot.  Has the patient had major surgery during 100 days prior to admission? No  Current Medications   Current facility-administered medications:  . acetaminophen (TYLENOL) tablet 650 mg, 650 mg, Oral, Q6H PRN **OR** acetaminophen (TYLENOL) suppository 650 mg, 650 mg, Rectal, Q6H PRN, [name] V, MD . amiodarone (PACERONE) tablet 200 mg, 200 mg, Oral, Daily, Vianne Bulls, MD, 200 mg at 01/17/16 0923 . apixaban (ELIQUIS) tablet 5 mg, 5 mg, Oral, BID, Rebecka Apley, RPH, 5 mg at 01/17/16 8264 . atorvastatin (LIPITOR) tablet 10 mg, 10 mg, Oral, q morning - 10a, Ilene Qua Opyd, MD, 10 mg at 01/17/16 0923 . carvedilol (COREG) tablet 6.25 mg, 6.25 mg, Oral, BID WC, Ilene Qua Opyd, MD, 6.25 mg at 01/17/16 0923 . Chlorhexidine Gluconate Cloth 2 % PADS 6 each, 6 each, Topical, Daily, Lavina Hamman, MD, 6 each at 01/17/16 1147 . feeding supplement (PRO-STAT SUGAR FREE 64) liquid 30 mL, 30 mL, Oral, BID, Lavina Hamman, MD, 30 mL at 01/15/16 1126 . HYDROcodone-acetaminophen (NORCO/VICODIN) 5-325 MG per tablet 1-2 tablet, 1-2 tablet, Oral, Q4H PRN, Vianne Bulls, MD, 2 tablet at 01/17/16 0557 . insulin aspart (novoLOG) injection 0-15 Units, 0-15 Units, Subcutaneous, TID WC, Lavina Hamman, MD, 5 Units at 01/17/16 1238 .  insulin aspart (novoLOG) injection 0-5 Units, 0-5 Units, Subcutaneous, QHS, Lavina Hamman, MD, 3 Units at 01/14/16 2200 . insulin glargine (LANTUS) injection 22 Units, 22 Units, Subcutaneous, QHS, Lavina Hamman, MD, 22 Units at 01/16/16 2253 . methocarbamol (ROBAXIN) tablet 500 mg, 500 mg, Oral, Q6H PRN, 500 mg at 01/17/16 0557 **OR** methocarbamol (ROBAXIN) 500 mg in dextrose 5 % 50 mL IVPB, 500 mg, Intravenous, Q6H PRN, [name] V, MD . metoCLOPramide (REGLAN) tablet 5-10 mg, 5-10 mg, Oral, Q8H PRN **OR** metoCLOPramide (REGLAN) injection 5-10 mg, 5-10 mg, Intravenous, Q8H PRN, [name] V, MD . multivitamin with minerals tablet 1 tablet, 1 tablet, Oral, Daily, Vianne Bulls, MD, 1 tablet at 01/17/16 [phone] . mupirocin ointment (BACTROBAN) 2 % 1 application, 1 application, Nasal, BID, Lavina Hamman, MD, 1 application at 09/40/76 1151 . ondansetron (ZOFRAN) tablet 4 mg, 4 mg, Oral, Q6H PRN **OR** ondansetron (ZOFRAN) injection 4 mg, 4 mg, Intravenous, Q6H PRN, [name] V, MD, 4 mg at 01/16/16 0555 . saccharomyces boulardii (FLORASTOR) capsule 250 mg, 250 mg, Oral, BID, Lavina Hamman, MD, 250 mg at 01/17/16 1238 . sodium chloride flush (NS) 0.9 % injection 10-40 mL, 10-40 mL, Intracatheter, PRN, Nishant Dhungel, MD, 10 mL at 01/11/16 0420 . sodium chloride flush (NS) 0.9 % injection 10-40 mL, 10-40 mL, Intracatheter, PRN, Lavina Hamman, MD, 20 mL at 01/14/16 0109  Patients Current Diet: Diet Carb Modified Fluid consistency:: Thin; Room service appropriate?: Yes Diet - low sodium heart healthy Diet Carb Modified  Precautions / Restrictions Precautions Precautions: Fall Restrictions Weight Bearing Restrictions: Yes RLE Weight Bearing: Non weight bearing   Has the patient had 2 or more falls or a fall with injury in the past year?No. Patient reports 1 fall in the past year with no injury.  Prior Activity Level Community (5-7x/wk): Went out daily. Was driving. Worked 4-5  days a week FT at a [employer].  Home Assistive Devices / Equipment Home Equipment: Cane - single point, Shower seat  Prior Device Use: Indicate devices/aids used by the patient prior to current illness, exacerbation or injury? straignt cane, but seldom used.  Prior Functional Level Prior Function Level of Independence: Independent  Self Care: Did the patient need help bathing, dressing, using the toilet or eating? Independent  Indoor Mobility: Did the patient need assistance with walking from room to room (with or without device)? Independent  Stairs: Did the patient need assistance with internal or external stairs (with or without device)? Independent  Functional Cognition: Did the patient need help planning regular tasks such as shopping or remembering to take medications? Independent  Current Functional Level Cognition  Overall Cognitive Status: Within Functional Limits for tasks assessed Orientation Level: Oriented X4   Extremity Assessment (includes Sensation/Coordination)  Upper Extremity Assessment: Generalized weakness (decreased fine motor coordination in left and right)  Lower Extremity Assessment: Defer to PT evaluation RLE Deficits / Details: could not feel when OT touching Rt LE    ADLs  Overall ADL's : Needs assistance/impaired Grooming: Wash/dry face, Sitting, Set up, Supervision/safety (pt washed face sitting EOB) Upper Body Bathing: Set up, Supervision/ safety, Sitting Lower Body Bathing: Set up, Supervison/ safety, Sitting/lateral leans Lower Body Bathing Details (indicate cue type and reason): pt able to reach to wash bottom by leaning side to side and also washed front peri area Upper Body Dressing : Minimal assistance, Sitting Lower Body Dressing: Sitting/lateral leans, Minimal assistance Toilet Transfer Details (indicate cue type and reason): not assessed Functional mobility during ADLs: (Supervision for bed mobility) General ADL  Comments: Educated on LB ADL techniques (leaning, rolling, standing). Educated on desensitization techniques for Rt LE. Discussed tub transfer technique with tub bench. Explained tub transfer technique using tub bench.     Mobility  Overal bed mobility: Needs Assistance Bed Mobility: Rolling, Sidelying to Sit Rolling: Modified independent (Device/Increase time) Sidelying to sit: Modified independent (Device/Increase time) Supine to sit: Supervision Sit to supine: Supervision General bed mobility comments: able to roll onto side with use of rail, and rise to sitting with increased time needed.    Transfers  Overall transfer level: Needs assistance Transfers: Squat Pivot Transfers Sit to Stand: Min assist, +2 physical assistance, From elevated surface Squat pivot transfers: From elevated surface, Min assist, +2 safety/equipment General transfer comment: able to perform squat pivot with cues for hand placement and anterior translation. assist to clear and shift pelvis with use of pad. performed initiation of transfer 2 additional times to increase pt comfortability with transfer.    Ambulation / Gait / Stairs / Office manager / Balance Balance Overall balance assessment: Needs assistance Sitting balance-Leahy Scale: Good Standing balance-Leahy Scale: Poor    Special needs/care consideration BiPAP/CPAP No CPM No Continuous Drip IV No  Dialysis No  Life Vest No Oxygen No Special Bed No Trach Size No Wound Vac (area) Yes, to right BKA incisional site.  Skin R BKA surgical incision site  Bowel mgmt: Last BM 01/17/16, loose stool X 3 today Bladder mgmt: Voiding in the urinal Diabetic mgmt Yes, on oral medication and insulin at home    Previous Home Environment Living Arrangements: Alone Available Help at Discharge: Available PRN/intermittently, Friend(s) Type of Home: Mobile home Home Layout: One  level Home Access: Stairs to enter [employer]  of Steps: 9 Bathroom Shower/Tub: Chiropodist: Standard  Discharge Living Setting Plans for Discharge Living Setting: Alone, Mobile Home (Lives alone) Type of Home at Discharge: Mobile home (single wide mobile home.) Discharge Home Layout: One level Discharge Home Access: Stairs to enter Entrance Stairs-Number of Steps: 6 steps at back and 9 steps at the front. Patient looking into having a ramp built.  Social/Family/Support Systems Patient Roles: Parent, Other (Comment) (Has 2 sons who live 6 hrs away. Has friends.) Contact Information: Leighton Parody - friend [phone] Anticipated Caregiver: self Ability/Limitations of Caregiver: Despina Hick lives in [place] and works at [employer]. Caregiver Availability: Intermittent Discharge Plan Discussed with Primary Caregiver: Yes Is Caregiver In Agreement with Plan?: Yes Does Caregiver/Family have Issues with Lodging/Transportation while Pt is in Rehab?: No  Goals/Additional Needs Patient/Family Goal for Rehab: PT/OT mod I goals Expected length of stay: 8-12 days Cultural Considerations: [employer] Dietary Needs: Carb mod, med cal, thin liquids Equipment Needs: TBD Pt/Family Agrees to Admission and willing to participate: Yes Program Orientation Provided & Reviewed with Pt/Caregiver Including Roles & Responsibilities: Yes  Decrease burden of Care through IP rehab admission: N/A  Possible need for SNF placement upon discharge: Not planned  Patient Condition: This patient's condition remains as documented in the consult dated 01/16/16, in which the Rehabilitation Physician determined and documented that the patient's condition is appropriate for intensive rehabilitative care in an inpatient rehabilitation facility pending accommodations as home has 6-9 steps. These areas have been addressed. Patient has called and is trying to arrange for a ramp to be  built. Will admit to inpatient rehab today.  Preadmission Screen Completed By: Retta Diones, 01/17/2016 2:01 PM ______________________________________________________________________  Discussed status with Dr. Naaman Plummer on 01/17/16 at 1400 and received telephone approval for admission today.  Admission Coordinator: Retta Diones, time1400/Date05/02/17          Cosigned by: Meredith Staggers, MD at 01/17/2016 2:33 PM  Revision History     Date/Time User Provider Type Action   01/17/2016 2:33 PM Meredith Staggers, MD Physician Cosign   01/17/2016 2:01 PM Retta Diones, RN Rehab Admission Coordinator Sign

## 2016-01-18 ENCOUNTER — Inpatient Hospital Stay (HOSPITAL_COMMUNITY): Payer: Commercial Managed Care - HMO | Admitting: Physical Therapy

## 2016-01-18 ENCOUNTER — Inpatient Hospital Stay (HOSPITAL_COMMUNITY): Payer: Commercial Managed Care - HMO | Admitting: Occupational Therapy

## 2016-01-18 DIAGNOSIS — D72829 Elevated white blood cell count, unspecified: Secondary | ICD-10-CM

## 2016-01-18 DIAGNOSIS — N170 Acute kidney failure with tubular necrosis: Secondary | ICD-10-CM

## 2016-01-18 DIAGNOSIS — I480 Paroxysmal atrial fibrillation: Secondary | ICD-10-CM

## 2016-01-18 DIAGNOSIS — E871 Hypo-osmolality and hyponatremia: Secondary | ICD-10-CM

## 2016-01-18 LAB — CBC WITH DIFFERENTIAL/PLATELET
BASOS ABS: 0 10*3/uL (ref 0.0–0.1)
BASOS PCT: 0 %
EOS ABS: 0.3 10*3/uL (ref 0.0–0.7)
EOS PCT: 2 %
HCT: 32.8 % — ABNORMAL LOW (ref 39.0–52.0)
Hemoglobin: 10.5 g/dL — ABNORMAL LOW (ref 13.0–17.0)
LYMPHS PCT: 18 %
Lymphs Abs: 2.4 10*3/uL (ref 0.7–4.0)
MCH: 26.6 pg (ref 26.0–34.0)
MCHC: 32 g/dL (ref 30.0–36.0)
MCV: 83.2 fL (ref 78.0–100.0)
Monocytes Absolute: 1.2 10*3/uL — ABNORMAL HIGH (ref 0.1–1.0)
Monocytes Relative: 9 %
Neutro Abs: 9.1 10*3/uL — ABNORMAL HIGH (ref 1.7–7.7)
Neutrophils Relative %: 71 %
PLATELETS: 378 10*3/uL (ref 150–400)
RBC: 3.94 MIL/uL — ABNORMAL LOW (ref 4.22–5.81)
RDW: 14.3 % (ref 11.5–15.5)
WBC: 13 10*3/uL — AB (ref 4.0–10.5)

## 2016-01-18 LAB — GLUCOSE, CAPILLARY
GLUCOSE-CAPILLARY: 172 mg/dL — AB (ref 65–99)
GLUCOSE-CAPILLARY: 216 mg/dL — AB (ref 65–99)
Glucose-Capillary: 202 mg/dL — ABNORMAL HIGH (ref 65–99)
Glucose-Capillary: 262 mg/dL — ABNORMAL HIGH (ref 65–99)

## 2016-01-18 LAB — COMPREHENSIVE METABOLIC PANEL
ALT: 13 U/L — ABNORMAL LOW (ref 17–63)
AST: 24 U/L (ref 15–41)
Albumin: 1.5 g/dL — ABNORMAL LOW (ref 3.5–5.0)
Alkaline Phosphatase: 90 U/L (ref 38–126)
Anion gap: 5 (ref 5–15)
BUN: 19 mg/dL (ref 6–20)
CALCIUM: 7.6 mg/dL — AB (ref 8.9–10.3)
CHLORIDE: 105 mmol/L (ref 101–111)
CO2: 24 mmol/L (ref 22–32)
Creatinine, Ser: 1.82 mg/dL — ABNORMAL HIGH (ref 0.61–1.24)
GFR calc Af Amer: 42 mL/min — ABNORMAL LOW (ref >60)
GFR, EST NON AFRICAN AMERICAN: 36 mL/min — AB (ref >60)
Glucose, Bld: 213 mg/dL — ABNORMAL HIGH (ref 65–99)
POTASSIUM: 3.9 mmol/L (ref 3.5–5.1)
SODIUM: 134 mmol/L — AB (ref 135–145)
TOTAL PROTEIN: 6 g/dL — AB (ref 6.5–8.1)
Total Bilirubin: 0.4 mg/dL (ref 0.3–1.2)

## 2016-01-18 MED ORDER — SODIUM CHLORIDE 0.9% FLUSH
10.0000 mL | INTRAVENOUS | Status: DC | PRN
  Administered 2016-01-20: 10 mL
  Administered 2016-01-22: 20 mL
  Administered 2016-01-23 – 2016-01-28 (×7): 10 mL
  Filled 2016-01-18 (×9): qty 40

## 2016-01-18 NOTE — IPOC Note (Signed)
Overall Plan of Care [facility]) Patient Details Name: Thomas Wright MRN: [id number] DOB: 12-16-1945  Admitting Diagnosis: R Bka  Hospital Problems: Principal Problem:   Status post below knee amputation of right lower extremity ([place]) Active Problems:   PAF (paroxysmal atrial fibrillation) ([place])   Essential hypertension   Type 2 diabetes mellitus with peripheral neuropathy ([facility])   Chronic combined systolic and diastolic congestive heart failure ([facility])   Acute blood loss anemia   Amputation of right lower extremity below knee with complication ([facility])   Hyponatremia     Functional Problem List: Nursing Bladder, Bowel, Endurance, Medication Management, Pain, Safety, Skin Integrity  PT Balance, Endurance, Motor, Pain, Safety  OT Balance, Endurance, Safety, Pain, Motor  SLP    TR         Basic ADL's: OT Bathing, Dressing, Toileting     Advanced  ADL's: OT Simple Meal Preparation     Transfers: PT Bed Mobility, Bed to Chair, Furniture, Musician, Floor  OT Toilet, Metallurgist: PT Ambulation, Emergency planning/management officer, Stairs     Additional Impairments: OT None  SLP        TR      Anticipated Outcomes Item Anticipated Outcome  Self Feeding Independent  Swallowing      Basic self-care  Supervision- mod I  Toileting  Mod I   Bathroom Transfers Mod I- supervision  Bowel/Bladder  Patient will require min assist with bowel and bladder  Transfers  mod I   Locomotion  mod I - supervision with WC. min A for short distances with RW with min A.   Communication     Cognition     Pain  Patient will rate pain less than 3 on 1-10 scale  Safety/Judgment  Patient will be free from injury/ falls during stay on inpatient rehab.    Therapy Plan: PT Intensity: Minimum of 1-2 x/day ,45 to 90 minutes PT Frequency: 5 out of 7 days PT Duration Estimated Length of Stay: 14-18 days  OT Intensity: Minimum of 1-2 x/day, 45 to 90 minutes OT Frequency: 5 out of 7 days OT  Duration/Estimated Length of Stay: 2-2.5 weeks         Team Interventions: Nursing Interventions Patient/Family Education, Bladder Management, Bowel Management, Pain Management, Medication Management, Skin Care/Wound Management, Psychosocial Support  PT interventions Ambulation/gait training, Training and development officer, Cognitive remediation/compensation, Community reintegration, Discharge planning, Disease management/prevention, DME/adaptive equipment instruction, Functional mobility training, Neuromuscular re-education, Pain management, Patient/family education, Psychosocial support, Skin care/wound management, Stair training, UE/LE Strength taining/ROM, Splinting/orthotics, Therapeutic Activities, Therapeutic Exercise, UE/LE Coordination activities, Visual/perceptual remediation/compensation, Wheelchair propulsion/positioning  OT Interventions Training and development officer, Discharge planning, Community reintegration, Engineer, drilling, Functional mobility training, Pain management, Patient/family education, Self Care/advanced ADL retraining, Therapeutic Activities, Therapeutic Exercise, UE/LE Strength taining/ROM, UE/LE Coordination activities  SLP Interventions    TR Interventions    SW/CM Interventions Discharge Planning, Psychosocial Support, Patient/Family Education    Team Discharge Planning: Destination: PT-Home ,OT- Home , SLP-  Projected Follow-up: PT-Home health PT, OT-  Home health OT, SLP-  Projected Equipment Needs: PT-Wheelchair (measurements), Wheelchair cushion (measurements), Rolling walker with 5" wheels, Sliding board, OT- To be determined, SLP-  Equipment Details: PT- , OT-  Patient/family involved in discharge planning: PT- Patient,  OT-Patient, SLP-   MD ELOS: 14-17days. Medical Rehab Prognosis:  Good Assessment: 70 y.o. right handed male with history of hypertension, PAF maintained on Eliquis, diabetes mellitus peripheral neuropathy, aortic regurgitation  status post valve replacement, chronic combined systolic and  diastolic congestive heart failure. Patient lives alone independently with a cane prior to admission. No local family. Mobile home with 9 steps to entry. Presented 01/09/2016 with low-grade fever, tachycardia in the 110s, leukocytosis 28,300, lactic acid 3.58 and gangrenous right foot. X-rays of right foot could not exclude very early changes of osteomyelitis. Patient with elevated creatinine 2.34 from baseline 0.9-1.1. Renal ultrasound no acute abnormalities. Renal serviceconsulted. Felt AKI in setting of early sepsis presumed ischemic ATN. Conservative care close monitoring of renal function with gentle IV fluids.Persistent leukocytosis. 25,900. MRSA PCR screen negative.Initially placed on vancomycin and cefepime and later simplified to cefepime and completed 01/17/2016. No change with conservative care of gangrenous right foot. 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 Sharol Given. Application of wound VAC. Hospital course pain management. Acute blood loss anemia. Renal function stabilizing. Pt with deficits with gait, transfers, and wound care.  Will set goals to Mod I with PT and Supervision/Mod I with OT.    See Team Conference Notes for weekly updates to the plan of care

## 2016-01-18 NOTE — Evaluation (Signed)
Occupational Therapy Assessment and Plan  Patient Details  Name: Thomas Wright MRN: [id number] Date of Birth: 1946-03-13  OT Diagnosis: acute pain and muscle weakness (generalized) Rehab Potential: Rehab Potential (ACUTE ONLY): Good ELOS: 2-2.5 weeks   Today's Date: 01/18/2016 OT Individual Time: 1100-1200 OT Individual Time Calculation (min): 60 min     Problem List:  Patient Active Problem List   Diagnosis Date Noted  . Hyponatremia   . Diarrhea 01/17/2016  . 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 AKI (acute kidney injury) ([place])   . Type 2 diabetes mellitus with peripheral neuropathy (HCC)   . Status post aortic valve replacement   . Chronic combined systolic and diastolic congestive heart failure ([place])   . ATN (acute tubular necrosis) ([place])   . Postoperative pain   . Acute blood loss anemia   . Leukocytosis   . Morbid obesity ([place])   . Status post below knee amputation of right lower extremity ([place])   . Insulin dependent diabetes mellitus ([place]) 01/09/2016  . Diabetic foot infection ([place]) 01/09/2016  . Sepsis, unspecified organism ([place]) 01/09/2016  . PAF (paroxysmal atrial fibrillation) ([place]) 04/22/2015  . Essential hypertension 04/22/2015  . Obesity 04/22/2015  . Dyspnea 04/20/2015  . Acute on chronic diastolic CHF (congestive heart failure) ([place]) 04/19/2015  . Cellulitis of leg 11/23/2014  . Diabetes ([place]) 09/13/2014  . Chronic anticoagulation 09/13/2014  . Atrial flutter, unspecified   . Paroxysmal atrial fibrillation (HCC)   . Perirectal abscess   . Other hemorrhoids   . Tenosynovitis of finger 05/05/2014  . Cellulitis and abscess of digit 05/05/2014  . Back pain   . H/O aortic valve replacement with tissue graft   . Hypertriglyceridemia   . Hypertension     Past Medical History:  Past Medical History  Diagnosis Date  . Hypertension   . PAF (paroxysmal atrial fibrillation) ([place])     a. Postop 2008. b. recurrent  afib 08/2014 with LAA clot noted on TEE. EF was down again at 25%. He was anticoagulated with Eliquis and placed on Amiodarone.He ultimately underwent TEE-DCCV 08/2014.  Marland Kitchen Hypertriglyceridemia   . Cardiomyopathy ([place])     EF originally 20%-now 45%, no CAD on cath  . Chronic combined systolic and diastolic CHF (congestive heart failure) ([place])     a. 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 Reported h/o cath in 2008 without significant CAD.  Marland Kitchen Type II diabetes mellitus ([place])   . Arthritis     .  Marland Kitchen Depression     "a little right now" (04/19/2015)  . S/P aortic valve replacement with bioprosthetic valve     a. bioprosthetic AVR in 05/2007 in [place].  . Hyperlipidemia    Past Surgical History:  Past Surgical History  Procedure Laterality Date  . Aortic valve replacement  05/2007    with tissue graft  . I&d extremity Right 05/05/2014    Procedure: IRRIGATION AND DEBRIDEMENT EXTREMITY;  Surgeon: [name], MD;  Location: [facility];  Service: Orthopedics;  Laterality: Right;  . Tee without cardioversion N/A 07/23/2014    Procedure: TRANSESOPHAGEAL ECHOCARDIOGRAM (TEE);  Surgeon: Josue Hector, MD;  Location: [facility] ENDOSCOPY;  Service: Cardiovascular;  Laterality: N/A;  . Cardioversion N/A 07/23/2014    Procedure: CARDIOVERSION;  Surgeon: Josue Hector, MD;  Location: [facility] ENDOSCOPY;  Service: Cardiovascular;  Laterality: N/A;  . Tee without cardioversion  N/A 09/01/2014    Procedure: TRANSESOPHAGEAL ECHOCARDIOGRAM (TEE);  Surgeon: [name], MD;  Location[facility] ENDOSCOPY;  Service: Cardiovascular;  Laterality: N/A;  . Cardioversion N/A 09/01/2014    Procedure: CARDIOVERSION;  Surgeon: [name], MD;  Location: [facility] ENDOSCOPY;  Service: Cardiovascular;  Laterality: N/A;  . Amputation Right 07/29/2014    Procedure: AMPUTATION RIGHT LONG FINGER;  Surgeon: [name], MD;  Location: [place];  Service: Orthopedics;  Laterality:  Right;  . Cardiac valve replacement    . Tonsillectomy  1954  . Cardiac catheterization  "several"  . Amputation Right 01/13/2016    Procedure: RIGHT BELOW KNEE AMPUTATION;  Surgeon: Newt Minion, MD;  Location: [place];  Service: Orthopedics;  Laterality: Right;  . Application of wound vac Right 01/13/2016    Procedure: APPLICATION OF WOUND VAC;  Surgeon: Newt Minion, MD;  Location: [place];  Service: Orthopedics;  Laterality: Right;    Assessment & Plan Clinical Impression: [name] is a 70 y.o. right handed male with history of hypertension, PAF maintained on Eliquis, diabetes mellitus peripheral neuropathy, aortic regurgitation status post valve replacement, chronic combined systolic and diastolic congestive heart failure. Patient lives alone independently with a cane prior to admission. No local family. Mobile home with 9 steps to entry. Presented 01/09/2016 with low-grade fever, tachycardia in the 110s, leukocytosis 28,300, lactic acid 3.58 and gangrenous right foot. X-rays of right foot could not exclude very early changes of osteomyelitis. Patient with elevated creatinine 2.34 from baseline 0.9-1.1. Renal ultrasound no acute abnormalities. Renal service consulted. Felt AKI in setting of early sepsis presumed ischemic ATN. Conservative care close monitoring of renal function with gentle IV fluids.Persistent leukocytosis. 25,900. MRSA PCR screen negative.Initially placed on vancomycin and cefepime and later simplified to cefepime And completed 01/17/2016. No change With conservative care of gangrenous right foot. 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 Sharol Given. Application of wound VAC. Hospital course pain management. Acute blood loss anemia 10.7 and monitored. Renal function stabilizing creatinine 1.91. Physical and occupational therapy evaluations completed with recommendations of physical medicine rehabilitation consult.Patient was admitted for a comprehensive rehabilitation  program  Patient transferred to CIR on 01/17/2016 .    Patient currently requires max with basic self-care skills secondary to muscle weakness, decreased cardiorespiratoy endurance and decreased sitting balance, decreased standing balance and decreased balance strategies.  Prior to hospitalization, patient could complete ADLs/IADLs with independent .  Patient will benefit from skilled intervention to decrease level of assist with basic self-care skills, increase independence with basic self-care skills and increase level of independence with iADL prior to discharge home independently.  Anticipate patient will require intermittent supervision and follow up home health.  OT - End of Session Activity Tolerance: Tolerates 10 - 20 min activity with multiple rests Endurance Deficit: Yes OT Assessment Rehab Potential (ACUTE ONLY): Good Barriers to Discharge: Decreased caregiver support;Inaccessible home environment Barriers to Discharge Comments: Home requires 6-9 STE and intermittent assist only at d/c OT Patient demonstrates impairments in the following area(s): Balance;Endurance;Safety;Pain;Motor OT Basic ADL's Functional Problem(s): Bathing;Dressing;Toileting OT Advanced ADL's Functional Problem(s): Simple Meal Preparation OT Transfers Functional Problem(s): Toilet;Tub/Shower OT Additional Impairment(s): None OT Plan OT Intensity: Minimum of 1-2 x/day, 45 to 90 minutes OT Frequency: 5 out of 7 days OT Duration/Estimated Length of Stay: 2-2.5 weeks OT Treatment/Interventions: Balance/vestibular training;Discharge planning;Community reintegration;DME/adaptive equipment instruction;Functional mobility training;Pain management;Patient/family education;Self Care/advanced ADL retraining;Therapeutic Activities;Therapeutic Exercise;UE/LE Strength taining/ROM;UE/LE Coordination activities OT Self Feeding Anticipated Outcome(s): Independent OT Basic Self-Care Anticipated Outcome(s): Supervision- mod  I OT  Toileting Anticipated Outcome(s): Mod I OT Bathroom Transfers Anticipated Outcome(s): Mod I- supervision OT Recommendation Patient destination: Home Follow Up Recommendations: Home health OT Equipment Recommended: To be determined   Skilled Therapeutic Intervention Pt seen for OT Eval and ADL session. Pt sitting up in w/c upon arrival, agreeable to tx session. He completed grooming tasks with set-up assist at sink.  Completed sliding board transfer to drop arm BSC with min-mod A. Educated regarding use of lateral leans to pull pants down and simulated task with steadying assist.  Pt left sitting up in w/c at end of session, all needs in reach.  He voiced increased fear/ anxiety regarding attempts to stand and declined any stand/ squat pivots during session.  Educated throughout session regarding role of OT, OT goals, POC, CIR, phantom pain, d/c planning, use of call bell and need for assist with mobility.   OT Evaluation Precautions/Restrictions  Precautions Precautions: Fall Restrictions Weight Bearing Restrictions: Yes RLE Weight Bearing: Non weight bearing General Chart Reviewed: Yes Pain Pain Assessment Pain Assessment: 0-10 Pain Score: 4  Pain Intervention(s): Repositioned; increased activity Home Living/Prior Functioning Home Living Available Help at Discharge: Available PRN/intermittently, Friend(s) Type of Home: Mobile home Home Access: Stairs to enter [employer] of Steps: 9 in front 6 in back Entrance Stairs-Rails: Left Home Layout: One level Bathroom Shower/Tub: Government social research officer Accessibility: Yes Additional Comments: SPC at home  Lives With: Alone IADL History Homemaking Responsibilities: Yes Current License: Yes Mode of Transportation: Car Occupation: Full time employment Type of Occupation: Worked full time as Multimedia programmer at [employer] Prior Function Level of Independence: Requires assistive device for  independence, Independent with basic ADLs  Able to Take Stairs?: Yes Driving: Yes Vocation: Full time employment Vocation Requirements: 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, clinical  Vision/Perception  Vision- History Baseline Vision/History: Wears glasses Wears Glasses: At all times Patient Visual Report: No change from baseline Vision- Assessment Vision Assessment?: No apparent visual deficits  Cognition Overall Cognitive Status: Within Functional Limits for tasks assessed Arousal/Alertness: Awake/alert Orientation Level: Person;Place;Situation Person: Oriented Place: Oriented Situation: Oriented Year: 2017 Month: May Day of Week: Correct Memory: Appears intact Immediate Memory Recall: Sock;Blue;Bed Memory Recall: Sock;Blue;Bed Memory Recall Sock: Without Cue Memory Recall Blue: Without Cue Memory Recall Bed: Without Cue Awareness: Appears intact Safety/Judgment: Appears intact Sensation Sensation Light Touch: Appears Intact Hot/Cold: Appears Intact Proprioception: Appears Intact Additional Comments: neuropathy in B UEs  Coordination Gross Motor Movements are Fluid and Coordinated: Yes Fine Motor Movements are Fluid and Coordinated: Yes Motor  Motor Motor: Other (comment) Motor - Skilled Clinical Observations: General weakness.  Mobility  Bed Mobility Bed Mobility: Rolling Right;Rolling Left;Supine to Sit;Sit to Supine Rolling Right: 4: Min assist Rolling Right Details: Verbal cues for safe use of DME/AE;Verbal cues for sequencing;Verbal cues for technique;Verbal cues for precautions/safety Rolling Left: 4: Min assist Rolling Left Details: Verbal cues for sequencing;Verbal cues for technique;Tactile cues for sequencing;Verbal cues for precautions/safety;Verbal cues for safe use of DME/AE Supine to Sit: 4: Min assist Supine to Sit Details: Verbal cues for sequencing;Verbal cues for technique;Verbal cues for precautions/safety;Tactile cues for weight shifting;Tactile  cues for placement;Verbal cues for safe use of DME/AE Sit to Supine: 4: Min assist Sit to Supine - Details: Verbal cues for sequencing;Verbal cues for technique;Verbal cues for precautions/safety;Verbal cues for safe use of DME/AE;Tactile cues for weight shifting;Tactile cues for placement Transfers Sit to Stand: 2: Max assist Sit to Stand Details: Tactile cues for sequencing;Tactile cues  for weight shifting;Verbal cues for sequencing;Verbal cues for technique;Verbal cues for precautions/safety;Manual facilitation for placement;Manual facilitation for weight bearing Stand to Sit: 2: Max assist Stand to Sit Details (indicate cue type and reason): Visual cues for safe use of DME/AE;Verbal cues for technique;Verbal cues for gait pattern;Verbal cues for safe use of DME/AE;Tactile cues for sequencing;Tactile cues for weight shifting;Verbal cues for sequencing;Verbal cues for precautions/safety;Visual cues/gestures for precautions/safety  Trunk/Postural Assessment  Cervical Assessment Cervical Assessment: Within Functional Limits Thoracic Assessment Thoracic Assessment: Exceptions to [facility] (Flexed posture) Thoracic AROM Overall Thoracic AROM Comments: Mild increased thoracic kyphosis.  Lumbar Assessment Lumbar Assessment: Exceptions to [facility] (Posterior pelvic tilt) Lumbar AROM Overall Lumbar AROM Comments: decreased lumbar lordosis.  Postural Control Postural Control: Within Functional Limits  Balance Balance Balance Assessed: Yes Static Sitting Balance Static Sitting - Comment/# of Minutes: Supervision A.  Dynamic Sitting Balance Dynamic Sitting Balance - Compensations: Supervision- min A with reaching outside BOS.  Static Standing Balance Static Standing - Comment/# of Minutes: max A with BUE support.  Extremity/Trunk Assessment RUE Assessment RUE Assessment: Exceptions to WFL (4/5 overall) LUE Assessment LUE Assessment: Within Functional Limits (4+/5 overall)   See Function Navigator  for Current Functional Status.   Refer to Care Plan for Long Term Goals  Recommendations for other services: None  Discharge Criteria: Patient will be discharged from OT if patient refuses treatment 3 consecutive times without medical reason, if treatment goals not met, if there is a change in medical status, if patient makes no progress towards goals or if patient is discharged from hospital.  The above assessment, treatment plan, treatment alternatives and goals were discussed and mutually agreed upon: by patient  Ernestina Patches 01/18/2016, 12:57 PM

## 2016-01-18 NOTE — Progress Notes (Signed)
[place] PHYSICAL MEDICINE & REHABILITATION     PROGRESS NOTE  Subjective/Complaints:  Pt sitting up in bed this AM.  He states he slept well and he needs to have a BM before starting therapies.    ROS: Denies CP, SOB, N/V/D.   Objective: Vital Signs: Blood pressure 158/72, pulse 74, temperature 98.3 F (36.8 C), temperature source Oral, resp. rate 18, height 6\' 1"  (1.854 m), weight 98 kg (216 lb 0.8 oz), SpO2 98 %. No results found.  Recent Labs  01/17/16 0500 01/18/16 0429  WBC 14.0* 13.0*  HGB 11.2* 10.5*  HCT 35.0* 32.8*  PLT 407* 378    Recent Labs  01/17/16 0500 01/18/16 0429  NA 135 134*  K 3.8 3.9  CL 105 105  GLUCOSE 167* 213*  BUN 20 19  CREATININE 1.89* 1.82*  CALCIUM 7.7* 7.6*   CBG (last 3)   Recent Labs  01/17/16 1712 01/17/16 2031 01/18/16 0621  GLUCAP 264* 197* 172*    Wt Readings from Last 3 Encounters:  01/18/16 98 kg (216 lb 0.8 oz)  01/17/16 111.721 kg (246 lb 4.8 oz)  01/04/16 106.595 kg (235 lb)    Physical Exam:  BP 158/72 mmHg  Pulse 74  Temp(Src) 98.3 F (36.8 C) (Oral)  Resp 18  Ht 6\' 1"  (1.854 m)  Wt 98 kg (216 lb 0.8 oz)  BMI 28.51 kg/m2  SpO2 98% Constitutional: He appears well-developed. Obese. NAD HENT: oral mucosa pink and moist Head: Normocephalic and atraumatic.  Eyes: Conjunctivae and EOM are normal.  Cardiovascular: Normal rate and regular rhythm. no murmurs  Respiratory: Effort normal and breath sounds normal. No respiratory distress. No wheezes or rales. GI: Soft. Bowel sounds are normal. He exhibits no distension.  Musculoskeletal: He exhibits mild tenderness. He exhibits no edema.  Neurological: He is alert and oriented.  Motor: Bilateral upper extremity 4+/5 proximal to distal Left lower extremity hip flexion, knee extension 4- to 4/5 (pain inhibition), ankle dorsi/plantar flexion 5/5 Right lower extremity hip flexion: 4 -/5 (pain inhibition)  Skin:  BKA with wound VAC in place  Psychiatric: He  has a normal mood and affect. His behavior is normal  Assessment/Plan: 1. Functional deficits secondary to right BKA which require 3+ hours per day of interdisciplinary therapy in a comprehensive inpatient rehab setting. Physiatrist is providing close team supervision and 24 hour management of active medical problems listed below. Physiatrist and rehab team continue to assess barriers to discharge/monitor patient progress toward functional and medical goals.  Function:  Bathing Bathing position      Bathing parts      Bathing assist        Upper Body Dressing/Undressing Upper body dressing                    Upper body assist        Lower Body Dressing/Undressing Lower body dressing                                  Lower body assist        Toileting Toileting          Toileting assist     Transfers Chair/bed transfer             Locomotion Ambulation           Wheelchair          Cognition Comprehension Comprehension assist level:  Follows basic conversation/direction with no assist  Expression Expression assist level: Expresses basic needs/ideas: With no assist  Social Interaction Social Interaction assist level: Interacts appropriately with others - No medications needed.  Problem Solving Problem solving assist level: Solves complex problems: Recognizes & self-corrects  Memory Memory assist level: Complete Independence: No helper    Medical Problem List and Plan: 1. Limited mobility secondary to right below-knee amputation 01/13/2016 secondary to gangrenous changes/multi-medical  Begin CIR 2. DVT Prophylaxis/Anticoagulation: Continue Eliquis as prior to admission. 3. Pain Management: Hydrocodone and Robaxin as needed. Monitor with increased mobility 4. Diabetes mellitus with peripheral neuropathy. Hemoglobin A1c 12.6.   Lantus insulin 22 units daily.   Check blood sugars before meals and at bedtime. Diabetic teaching  Will  cont to monitor and adjust with increased actvity 5. Neuropsych: This patient is capable of making decisions on his own behalf. 6. Skin/Wound Care: Routine skin checks.   Wound VAC remain in place 1 week post-op (~5/5) 7. Fluids/Electrolytes/Nutrition: Routine I&O   Mild hyponatremia: 134 on 5/3, will cont to monitor 8.Leukocytosis. Slowly improving. Maxipime completed 01/17/2016  WBCs 13.0 on 5/3  Cont to monitor 9. Hypertension/PAF.    Amiodarone 200 mg daily  Coreg 6.25 mg twice a day.  Will cont to monitor and consider further adjustments if necessary 10. Chronic combined systolic and diastolic congestive heart failure. No signs of fluid overload. Weigh patient daily  11. Acute renal insufficiency/presumably ischemic ATN. Renal ultrasound unremarkable.   Renal service is following, appreciate recs  Cr 1.82 on 5/3 12. Hyperlipidemia. Lipitor 13. ABLA  Hb 10.5 on 5/3  Cont to monitor   LOS (Days) 1 A FACE TO FACE EVALUATION WAS PERFORMED  Ankit Lorie Phenix 01/18/2016 10:25 AM

## 2016-01-18 NOTE — Progress Notes (Signed)
Patient information reviewed and entered into eRehab system by Miakoda Mcmillion, RN, CRRN, PPS Coordinator.  Information including medical coding and functional independence measure will be reviewed and updated through discharge.     Per nursing patient was given "Data Collection Information Summary for Patients in Inpatient Rehabilitation Facilities with attached "Privacy Act Statement-Health Care Records" upon admission.  

## 2016-01-18 NOTE — Evaluation (Signed)
Physical Therapy Assessment and Plan  Patient Details  Name: Thomas Wright MRN: [id number] Date of Birth: 1946/01/26  PT Diagnosis: Difficulty walking, Muscle weakness and Pain in R LE residual limb secondary to BKE.  Rehab Potential: Good ELOS: 14-18 days    Today's Date: 01/18/2016 PT Individual Time:   800-900 AND 1430-1545 Individual treatment time: 60 min and 75 min.   Problem List:  Patient Active Problem List   Diagnosis Date Noted  . Hyponatremia   . Diarrhea 01/17/2016  . Amputation of right lower extremity below knee with complication ([place]) 35/36/1443  . AKI (acute kidney injury) ([place])   . Type 2 diabetes mellitus with peripheral neuropathy (HCC)   . Status post aortic valve replacement   . Chronic combined systolic and diastolic congestive heart failure ([place])   . ATN (acute tubular necrosis) ([place])   . Postoperative pain   . Acute blood loss anemia   . Leukocytosis   . Morbid obesity ([place])   . Status post below knee amputation of right lower extremity ([place])   . Insulin dependent diabetes mellitus ([place]) 01/09/2016  . Diabetic foot infection ([place]) 01/09/2016  . Sepsis, unspecified organism ([place]) 01/09/2016  . PAF (paroxysmal atrial fibrillation) ([place]) 04/22/2015  . Essential hypertension 04/22/2015  . Obesity 04/22/2015  . Dyspnea 04/20/2015  . Acute on chronic diastolic CHF (congestive heart failure) ([place]) 04/19/2015  . Cellulitis of leg 11/23/2014  . Diabetes ([place]) 09/13/2014  . Chronic anticoagulation 09/13/2014  . Atrial flutter, unspecified   . Paroxysmal atrial fibrillation (HCC)   . Perirectal abscess   . Other hemorrhoids   . Tenosynovitis of finger 05/05/2014  . Cellulitis and abscess of digit 05/05/2014  . Back pain   . H/O aortic valve replacement with tissue graft   . Hypertriglyceridemia   . Hypertension     Past Medical History:  Past Medical History  Diagnosis Date  . Hypertension   . PAF (paroxysmal atrial fibrillation) ([place])     a. Postop  2008. b. recurrent afib 08/2014 with LAA clot noted on TEE. EF was down again at 25%. He was anticoagulated with Eliquis and placed on Amiodarone.He ultimately underwent TEE-DCCV 08/2014.  Marland Kitchen Hypertriglyceridemia   . Cardiomyopathy ([place])     EF originally 20%-now 45%, no CAD on cath  . Chronic combined systolic and diastolic CHF (congestive heart failure) ([place])     a. 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 Reported h/o cath in 2008 without significant CAD.  Marland Kitchen Type II diabetes mellitus ([place])   . Arthritis     .  Marland Kitchen Depression     "a little right now" (04/19/2015)  . S/P aortic valve replacement with bioprosthetic valve     a. bioprosthetic AVR in 05/2007 in [place].  . Hyperlipidemia    Past Surgical History:  Past Surgical History  Procedure Laterality Date  . Aortic valve replacement  05/2007    with tissue graft  . I&d extremity Right 05/05/2014    Procedure: IRRIGATION AND DEBRIDEMENT EXTREMITY;  Surgeon: [name], MD;  Location: [facility];  Service: Orthopedics;  Laterality: Right;  . Tee without cardioversion N/A 07/23/2014    Procedure: TRANSESOPHAGEAL ECHOCARDIOGRAM (TEE);  Surgeon: Josue Hector, MD;  Location: [facility] ENDOSCOPY;  Service: Cardiovascular;  Laterality: N/A;  . Cardioversion N/A 07/23/2014    Procedure: CARDIOVERSION;  Surgeon: Josue Hector, MD;  Location: [facility];  Service:  Cardiovascular;  Laterality: N/A;  . Tee without cardioversion N/A 09/01/2014    Procedure: TRANSESOPHAGEAL ECHOCARDIOGRAM (TEE);  Surgeon: Mark Skains, MD;  Location: [facility] ENDOSCOPY;  Service: Cardiovascular;  Laterality: N/A;  . Cardioversion N/A 09/01/2014    Procedure: CARDIOVERSION;  Surgeon: Mark Skains, MD;  Location: [facility] ENDOSCOPY;  Service: Cardiovascular;  Laterality: N/A;  . Amputation Right 07/29/2014    Procedure: AMPUTATION RIGHT LONG FINGER;  Surgeon: Matthew [name], MD;  Location: [facility] OR;  Service:  Orthopedics;  Laterality: Right;  . Cardiac valve replacement    . Tonsillectomy  1954  . Cardiac catheterization  "several"  . Amputation Right 01/13/2016    Procedure: RIGHT BELOW KNEE AMPUTATION;  Surgeon: Marcus Duda V, MD;  Location: [facility] OR;  Service: Orthopedics;  Laterality: Right;  . Application of wound vac Right 01/13/2016    Procedure: APPLICATION OF WOUND VAC;  Surgeon: Marcus Duda V, MD;  Location: [facility] OR;  Service: Orthopedics;  Laterality: Right;    Assessment & Plan Clinical Impression: Patient is a  H&P: Thomas Wright is a 70 y.o. right handed male with history of hypertension, PAF maintained on Eliquis, diabetes mellitus peripheral neuropathy, aortic regurgitation status post valve replacement, chronic combined systolic and diastolic congestive heart failure. Patient lives alone independently with a cane prior to admission. No local family. Mobile home with 9 steps to entry. Presented 01/09/2016 with low-grade fever, tachycardia in the 110s, leukocytosis 28,300, lactic acid 3.58 and gangrenous right foot. X-rays of right foot could not exclude very early changes of osteomyelitis. Patient with elevated creatinine 2.34 from baseline 0.9-1.1. Renal ultrasound no acute abnormalities. Renal service consulted. Felt AKI in setting of early sepsis presumed ischemic ATN. No change With conservative care of gangrenous right foot. 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 Duda. Application of wound VAC. Hospital course pain management. Acute blood loss anemia 10.7 and monitored. Renal function stabilizing creatinine 1.91.  Patient transferred to CIR on 01/17/2016 .   Patient currently requires max with mobility secondary to muscle weakness, decreased cardiorespiratoy endurance and decreased sitting balance, decreased standing balance, decreased balance strategies and difficulty maintaining precautions.  Prior to hospitalization, patient was independent  with mobility and lived with Alone  in a Mobile home home.  Home access is 9 in front 6 in backStairs to enter.  Patient will benefit from skilled PT intervention to maximize safe functional mobility, minimize fall risk and decrease caregiver burden for planned discharge home with 24 hour supervision.  Anticipate patient will benefit from follow up HH at discharge.  PT - End of Session Activity Tolerance: Tolerates 30+ min activity with multiple rests Endurance Deficit: Yes PT Assessment Rehab Potential (ACUTE/IP ONLY): Good Barriers to Discharge: Inaccessible home environment;Decreased caregiver support PT Patient demonstrates impairments in the following area(s): Balance;Endurance;Motor;Pain;Safety PT Transfers Functional Problem(s): Bed Mobility;Bed to Chair;Furniture;Car;Floor PT Locomotion Functional Problem(s): Ambulation;Wheelchair Mobility;Stairs PT Plan PT Intensity: Minimum of 1-2 x/day ,45 to 90 minutes PT Frequency: 5 out of 7 days PT Duration Estimated Length of Stay: 14-18 days  PT Treatment/Interventions: Ambulation/gait training;Balance/vestibular training;Cognitive remediation/compensation;Community reintegration;Discharge planning;Disease management/prevention;DME/adaptive equipment instruction;Functional mobility training;Neuromuscular re-education;Pain management;Patient/family education;Psychosocial support;Skin care/wound management;Stair training;UE/LE Strength taining/ROM;Splinting/orthotics;Therapeutic Activities;Therapeutic Exercise;UE/LE Coordination activities;Visual/perceptual remediation/compensation;Wheelchair propulsion/positioning PT Transfers Anticipated Outcome(s): mod I  PT Locomotion Anticipated Outcome(s): mod I - supervision with WC. min A for short distances with RW with min A.  PT Recommendation Follow Up Recommendations: Home health PT Patient destination: Home Equipment Recommended: Wheelchair (measurements);Wheelchair cushion (measurements);Rolling walker with 5" wheels;Sliding  board    Skilled Therapeutic Intervention Patient received supine in bed and agreeable to PT. PT performed Eval and initiated treatment intervention. See below for results. Patient performed bed mobility for roll L and R with min A and use of bed rails for bed pan placement and hygiene x 6 each direction. Supine to sit with min A. Squat pivot transfer to WC for max A from PT. Patient instructed in WC mobility for 100ft x 2 with supervision A for proper UE propulsion. Patient instructed in SB transfer with mod A x 3 with constant cues for UE positioning and proper use or sound LE for lift and scoot. Sit <>stand at RW from elevated mat table x 2 with max A, patient able to maintain standing for up to 15 seconds.   Session 2.  Patient received supine in bed and agreeable to PT. Patient performed bed mobility with min A and use of bed rails with min A from PT and cues for UE placement. SB transfer to WC from bed with mod A and moderate cues for UE positioning and improved hip/head positioning to prevent anterior LOB.   Patient performed WC mobility for 150ft x 2 and 100ft with supervision A and 1 rest break during each distance. Mod cues for proper use of BUE propulsion and increased push/pull for decreased turning radius with obstacles.   Car transfer training with SB and max  A from PT for incline into car. PT provided max verbal and visual cues for improved positioning and increased safety with transfer.   Sit to stand transfer training attempted from WC with RW, patient unable to produce enough force to overcome gravity. Sit<>stand in parallel bar with max A and pull from bars with BUE x 4. Max multimodal cues for increased activation of hip/ knee extension and increased weight bearing through hands rather than elbows.   Patient returned to room and left in bed. With call bell within reach.   PT Evaluation Precautions/Restrictions Precautions Precautions: Fall Restrictions Weight Bearing  Restrictions: Yes RLE Weight Bearing: Non weight bearing General   Vital Signs Pain Pain Assessment Pain Assessment: 0-10 Pain Score: 4  Pain Intervention(s): Medication (See eMAR) (Robaxin given) Home Living/Prior Functioning Home Living Available Help at Discharge: Available PRN/intermittently;Friend(s) Type of Home: Mobile home Home Access: Stairs to enter Entrance Stairs-Number of Steps: 9 in front 6 in back Entrance Stairs-Rails: Left Home Layout: One level Bathroom Shower/Tub: Tub/shower unit Bathroom Toilet: Standard Bathroom Accessibility: Yes Additional Comments: SPC at home  Lives With: Alone Prior Function Level of Independence: Requires assistive device for independence;Independent with basic ADLs  Able to Take Stairs?: Yes Driving: Yes Vocation: Full time employment 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   Mobility Bed Mobility Bed Mobility: Rolling Right;Rolling Left;Supine to Sit;Sit to Supine Rolling Right: 4: Min assist Rolling Right Details: Verbal cues for safe use of DME/AE;Verbal cues for sequencing;Verbal cues for technique;Verbal cues for precautions/safety Rolling Left: 4: Min assist Rolling Left Details: Verbal cues for sequencing;Verbal cues for technique;Tactile cues for sequencing;Verbal cues for precautions/safety;Verbal cues   for safe use of  DME/AE Supine to Sit: 4: Min assist Supine to Sit Details: Verbal cues for sequencing;Verbal cues for technique;Verbal cues for precautions/safety;Tactile cues for weight shifting;Tactile cues for placement;Verbal cues for safe use of DME/AE Sit to Supine: 4: Min assist Sit to Supine - Details: Verbal cues for sequencing;Verbal cues for technique;Verbal cues for precautions/safety;Verbal cues for safe use of DME/AE;Tactile cues for weight shifting;Tactile cues for placement Transfers Transfers: Yes Sit to Stand: 2: Max assist Sit to Stand Details: Tactile cues for sequencing;Tactile cues for weight shifting;Verbal cues for sequencing;Verbal cues for technique;Verbal cues for precautions/safety;Manual facilitation for placement;Manual facilitation for weight bearing Stand to Sit: 2: Max assist Stand to Sit Details (indicate cue type and reason): Visual cues for safe use of DME/AE;Verbal cues for technique;Verbal cues for gait pattern;Verbal cues for safe use of DME/AE;Tactile cues for sequencing;Tactile cues for weight shifting;Verbal cues for sequencing;Verbal cues for precautions/safety;Visual cues/gestures for precautions/safety Squat Pivot Transfers: 2: Max assist Squat Pivot Transfer Details: Manual facilitation for placement;Manual facilitation for weight shifting;Tactile cues for sequencing;Tactile cues for weight shifting;Verbal cues for sequencing;Verbal cues for technique;Verbal cues for precautions/safety;Verbal cues for safe use of DME/AE Lateral/Scoot Transfers: 3: Mod assist Lateral/Scoot Transfer Details: Tactile cues for initiation;Tactile cues for sequencing;Tactile cues for placement;Verbal cues for sequencing;Verbal cues for technique;Verbal cues for precautions/safety;Verbal cues for safe use of DME/AE;Visual cues/gestures for precautions/safety Locomotion  Ambulation Ambulation: No Gait Gait: No Stairs / Additional Locomotion Stairs: No Wheelchair Mobility Wheelchair  Mobility: Yes Wheelchair Assistance: 5: Supervision Wheelchair Assistance Details: Visual cues/gestures for precautions/safety;Visual cues/gestures for sequencing;Verbal cues for gait pattern;Verbal cues for safe use of DME/AE;Manual facilitation for weight shifting  Trunk/Postural Assessment  Cervical Assessment Cervical Assessment: Within Functional Limits Thoracic Assessment Thoracic Assessment: Exceptions to WFL (Flexed posture) Thoracic AROM Overall Thoracic AROM Comments: Mild increased thoracic kyphosis.  Lumbar Assessment Lumbar Assessment: Exceptions to WFL (Posterior pelvic tilt) Lumbar AROM Overall Lumbar AROM Comments: decreased lumbar lordosis.  Postural Control Postural Control: Within Functional Limits  Balance Balance Balance Assessed: Yes Static Sitting Balance Static Sitting - Comment/# of Minutes: Supervision A.  Dynamic Sitting Balance Dynamic Sitting Balance - Compensations: Supervision- min A with reaching outside BOS.  Static Standing Balance Static Standing - Comment/# of Minutes: max A with BUE support.  Extremity Assessment  RUE Assessment RUE Assessment: Exceptions to WFL (4/5 overall) LUE Assessment LUE Assessment: Within Functional Limits (4+/5 overall) RLE Assessment RLE Assessment: Exceptions to WFL RLE AROM (degrees) RLE Overall AROM Comments: full knee extension.  RLE Strength RLE Overall Strength Comments: Hip 4/5 for all motions. unable to test knee strength due to pain from recent BKA.  LLE Assessment LLE Assessment: Exceptions to WFL LLE Strength LLE Overall Strength Comments: 4/5 hip flexion/extensoin. 4+/5 hip abducation/adduction and knee flexion/extension   See Function Navigator for Current Functional Status.   Refer to Care Plan for Long Term Goals  Recommendations for other services: None  Discharge Criteria: Patient will be discharged from PT if patient refuses treatment 3 consecutive times without medical reason, if  treatment goals not met, if there is a change in medical status, if patient makes no progress towards goals or if patient is discharged from hospital.  The above assessment, treatment plan, treatment alternatives and goals were discussed and mutually agreed upon: by patient   E  01/18/2016, 12:58 PM  

## 2016-01-19 ENCOUNTER — Inpatient Hospital Stay (HOSPITAL_COMMUNITY): Payer: Commercial Managed Care - HMO

## 2016-01-19 ENCOUNTER — Inpatient Hospital Stay (HOSPITAL_COMMUNITY): Payer: Commercial Managed Care - HMO | Admitting: Physical Therapy

## 2016-01-19 LAB — GLUCOSE, CAPILLARY
GLUCOSE-CAPILLARY: 222 mg/dL — AB (ref 65–99)
GLUCOSE-CAPILLARY: 179 mg/dL — AB (ref 65–99)
Glucose-Capillary: 197 mg/dL — ABNORMAL HIGH (ref 65–99)
Glucose-Capillary: 182 mg/dL — ABNORMAL HIGH (ref 65–99)

## 2016-01-19 MED ORDER — INSULIN ASPART 100 UNIT/ML ~~LOC~~ SOLN
3.0000 [IU] | Freq: Three times a day (TID) | SUBCUTANEOUS | Status: DC
  Administered 2016-01-19 – 2016-01-22 (×10): 3 [IU] via SUBCUTANEOUS

## 2016-01-19 NOTE — Progress Notes (Signed)
Physical Therapy Session Note  Patient Details  Name: Thomas Wright MRN: [id number] Date of Birth: [birth year]  Today's Date: 01/19/2016 PT Individual Time: [id number] PT Individual Time Calculation (min): 35 min   Short Term Goals: Week 1:  PT Short Term Goal 1 (Week 1): Patient will performed Sb transfer with min A.  PT Short Term Goal 2 (Week 1): Patient will perform squat pivot transfer with mod A.  PT Short Term Goal 3 (Week 1): Patient will perform sit<>stand from [facility] with RW with mod A.  PT Short Term Goal 4 (Week 1): Patient will perform bed mobility with supervision A.  PT Short Term Goal 5 (Week 1): Patient will propell WC 150 with supervision A and min verbal instruction.   Skilled Therapeutic Interventions/Progress Updates:    Pt received seated in w/c with c/o pain as described below and agreeable to treatment. Seated LAQ 2x10 BLE. Transfer w/c >bed with transfer board and minA/min guard; min cues for correct board placement closer to knee. Sit >supine with S and bedrails. Performed glute sets, bridging, straight leg raise 2x15 each. Pt given handout with LE exercises for performance outside of normal therapy time, emphasized importance of maintaining/increasing BLE strength and range of motion. Also gave pt handout on positioning in bed and w/c, and phantom pain. Remained supine in bed at completion of session, all needs within reach.   Therapy Documentation Precautions:  Precautions Precautions: Fall Restrictions Weight Bearing Restrictions: Yes RLE Weight Bearing: Non weight bearing Pain: Pain Assessment Pain Assessment: 0-10 Pain Score: 5  Pain Type: Phantom pain Pain Location: Leg Pain Orientation: Right Pain Descriptors / Indicators: Aching Pain Frequency: Intermittent Pain Onset: On-going Patients Stated Pain Goal: 2 Pain Intervention(s): Medication (See eMAR);Repositioned Multiple Pain Sites: No   See Function Navigator for Current Functional  Status.   Therapy/Group: Individual Therapy  [name] 01/19/2016, 3:46 PM

## 2016-01-19 NOTE — Progress Notes (Signed)
[facility] PHYSICAL MEDICINE & REHABILITATION     PROGRESS NOTE  Subjective/Complaints:  Patient sitting up in bed this morning. He states he had some incisional pain overnight, but it was relieved with medications. He denies phantom limb pain. He also notes he had a BM this morning.   ROS: Denies phantom limb pain, CP, SOB, N/V/D.   Objective: Vital Signs: Blood pressure 142/68, pulse 78, temperature 98.6 F (37 C), temperature source Oral, resp. rate 18, height 6\' 1"  (1.854 m), weight 98.3 kg (216 lb 11.4 oz), SpO2 98 %. No results found.  Recent Labs  01/17/16 0500 01/18/16 0429  WBC 14.0* 13.0*  HGB 11.2* 10.5*  HCT 35.0* 32.8*  PLT 407* 378    Recent Labs  01/17/16 0500 01/18/16 0429  NA 135 134*  K 3.8 3.9  CL 105 105  GLUCOSE 167* 213*  BUN 20 19  CREATININE 1.89* 1.82*  CALCIUM 7.7* 7.6*   CBG (last 3)   Recent Labs  01/18/16 1648 01/18/16 2146 01/19/16 0655  GLUCAP 216* 262* 222*    Wt Readings from Last 3 Encounters:  01/19/16 98.3 kg (216 lb 11.4 oz)  01/17/16 111.721 kg (246 lb 4.8 oz)  01/04/16 106.595 kg (235 lb)    Physical Exam:  BP 142/68 mmHg  Pulse 78  Temp(Src) 98.6 F (37 C) (Oral)  Resp 18  Ht 6\' 1"  (1.854 m)  Wt 98.3 kg (216 lb 11.4 oz)  BMI 28.60 kg/m2  SpO2 98% Constitutional: He appears well-developed. Obese. NAD HENT: oral mucosa pink and moist Head: Normocephalic and atraumatic.  Eyes: Conjunctivae and EOM are normal.  Cardiovascular: Normal rate and regular rhythm. no murmurs  Respiratory: Effort normal and breath sounds normal. No respiratory distress. No wheezes or rales. GI: Soft. Bowel sounds are normal. He exhibits no distension.  Musculoskeletal: He exhibits mild tenderness. He exhibits no edema.  Neurological: He is alert and oriented.  Motor: Bilateral upper extremity 4+/5 proximal to distal Left lower extremity hip flexion, knee extension 4- to 4/5 (pain inhibition), ankle dorsi/plantar flexion 5/5 Right  lower extremity hip flexion: 4 -/5 (pain inhibition)  Skin:  BKA with wound VAC in place  Psychiatric: He has a normal mood and affect. His behavior is normal  Assessment/Plan: 1. Functional deficits secondary to right BKA which require 3+ hours per day of interdisciplinary therapy in a comprehensive inpatient rehab setting. Physiatrist is providing close team supervision and 24 hour management of active medical problems listed below. Physiatrist and rehab team continue to assess barriers to discharge/monitor patient progress toward functional and medical goals.  Function:  Bathing Bathing position   Position: Wheelchair/chair at sink  Bathing parts Body parts bathed by patient: Right arm, Left arm, Chest, Abdomen Body parts bathed by helper: Front perineal area, Buttocks, Back, Right upper leg, Left upper leg, Left lower leg  Bathing assist        Upper Body Dressing/Undressing Upper body dressing   What is the patient wearing?: Pull over shirt/dress     Pull over shirt/dress - Perfomed by patient: Thread/unthread right sleeve, Thread/unthread left sleeve, Put head through opening, Pull shirt over trunk          Upper body assist Assist Level: Set up   Set up : To obtain clothing/put away  Lower Body Dressing/Undressing Lower body dressing   What is the patient wearing?: Pants, Socks       Pants- Performed by helper: Thread/unthread right pants leg, Thread/unthread left pants leg, Pull  pants up/down     Socks - Performed by patient: Don/doff left sock (R n/a)                Lower body assist        Toileting Toileting   Toileting steps completed by patient: Performs perineal hygiene Toileting steps completed by helper: Adjust clothing prior to toileting, Adjust clothing after toileting    Toileting assist     Transfers Chair/bed transfer   Chair/bed transfer method: Squat pivot Chair/bed transfer assist level: Maximal assist (Pt 25 - 49%/lift and  lower) Chair/bed transfer assistive device: Bedrails, Armrests     Locomotion Ambulation Ambulation activity did not occur: Safety/medical concerns         Wheelchair   Type: Manual Max wheelchair distance: 150 Assist Level: Supervision or verbal cues  Cognition Comprehension Comprehension assist level: Follows basic conversation/direction with no assist  Expression Expression assist level: Expresses basic needs/ideas: With no assist  Social Interaction Social Interaction assist level: Interacts appropriately with others - No medications needed.  Problem Solving Problem solving assist level: Solves complex problems: Recognizes & self-corrects  Memory Memory assist level: Complete Independence: No helper    Medical Problem List and Plan: 1. 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 DVT Prophylaxis/Anticoagulation: Continue Eliquis as prior to admission. 3. Pain Management: Hydrocodone and Robaxin as needed. Monitor with increased mobility 4. Diabetes mellitus with peripheral neuropathy. Hemoglobin A1c 12.6.   Lantus insulin 22 units daily.   Check blood sugars before meals and at bedtime. Diabetic teaching  NovoLog 3 units 3 times a day with meals added on 5/4  Will cont to monitor and adjust with increased actvity 5. Neuropsych: This patient is capable of making decisions on his own behalf. 6. Skin/Wound Care: Routine skin checks.   Wound VAC remain in place 1 week post-op (~5/5) 7. Fluids/Electrolytes/Nutrition: Routine I&O   Mild hyponatremia: 134 on 5/3, will cont to monitor 8.Leukocytosis. Slowly improving. Maxipime completed 01/17/2016  WBCs 13.0 on 5/3  Cont to monitor 9. Hypertension/PAF.    Amiodarone 200 mg daily  Coreg 6.25 mg twice a day.  Will cont to monitor and consider further adjustments if necessary 10. Chronic combined systolic and diastolic congestive heart  failure. No signs of fluid overload. Weigh patient daily  Weight stable around 98 KG 11. Acute renal insufficiency/presumably ischemic ATN. Renal ultrasound unremarkable.   Renal service is following, appreciate recs  Cr 1.82 on 5/3 12. Hyperlipidemia. Lipitor 13. ABLA  Hb 10.5 on 5/3  Cont to monitor  LOS (Days) 2 A FACE TO FACE EVALUATION WAS PERFORMED  Shey Bartmess Lorie Phenix 01/19/2016 9:05 AM

## 2016-01-19 NOTE — Progress Notes (Signed)
Physical Therapy Session Note  Patient Details  Name: Thomas Wright MRN: [id number] Date of Birth: [birth year]  Today's Date: 01/19/2016 PT Individual Time: 1015-1115 PT Individual Time Calculation (min): 60 min   Short Term Goals: Week 1:  PT Short Term Goal 1 (Week 1): Patient will performed Sb transfer with min A.  PT Short Term Goal 2 (Week 1): Patient will perform squat pivot transfer with mod A.  PT Short Term Goal 3 (Week 1): Patient will perform sit<>stand from [facility] with RW with mod A.  PT Short Term Goal 4 (Week 1): Patient will perform bed mobility with supervision A.  PT Short Term Goal 5 (Week 1): Patient will propell WC 150 with supervision A and min verbal instruction.   Skilled Therapeutic Interventions/Progress Updates:    Patient received supine in bed with trade off from [place]. Patient requested to have bowel movement on Bed pan, PT recommended BSC, but patient didn't think that he would make it to [facility]. Roll R and L for bedpan placement and hygiene x 2 each dirction with min A and use of bed rails.   Bed WC transfer with SB with min-mod A from PT. Squat pivot transfer with mod A to L and Max A to R.  Constant cues from Proper UE placement and increased use of LLE for clearance from seat to increase ease of transfer.   WC mobility in hall for 138ft with one rest break at 172ft and 125 ft without rest break,  supervision A and significant increase in time; mod verbal cues for improved BUE propulsion and obstacle avoidance.   Therex:  Mini press ups from WC 2x 5  Seated marches x 12 BLE  LAQ x 15 BLE  Isometric hip abduction x 12  Isometric hip adduction x 12   For all seated therex, PT provided supervision A for improved safety as well as cues for improved ROM as tolerated, decreased compensation from Trunk and increased activation of appropriate muscle groups. Pt was noted to have mild adjustment in exercise technique following cues from PT.      Therapy  Documentation Precautions:  Precautions Precautions: Fall Restrictions Weight Bearing Restrictions: Yes RLE Weight Bearing: Non weight bearing Pain: Pain Assessment Pain Assessment: No/denies pain   See Function Navigator for Current Functional Status.   Therapy/Group: Individual Therapy  Lorie Phenix 01/19/2016, 12:43 PM

## 2016-01-19 NOTE — Progress Notes (Signed)
Occupational Therapy Session Note  Patient Details  Name: Thomas Wright MRN: [id number] Date of Birth: 06-04-1946  Today's Date: 01/19/2016 OT Individual Time: [id number] OT Individual Time Calculation (min): 60 min    Short Term Goals: Week 1:  OT Short Term Goal 1 (Week 1): Pt will transfer to drop arm BSC with min A OT Short Term Goal 2 (Week 1): Pt will stand for 30 seconds with steadying assist in prep for functional task OT Short Term Goal 3 (Week 1): Pt will dress LB with steadying assist OT Short Term Goal 4 (Week 1): Pt will tolerate full bathing/dressing session with 2 rest breaks in order to increase functional activity tolerance  Skilled Therapeutic Interventions/Progress Updates: ADL-retraining with focus on transfers, bed mobility, and adapted bathing/dressing at sink.  Pt received supine in bed and requesting use of bed pan to toilet.   Pt completes small BM and is educated on bed rolls and reach to perform hygiene (pt completes 90% of hygiene).   OT educated pt on use of drop-arm BSC and transfer skill.   With extra time, mod instructional cues, and manual facilitation to weight-shift pt completes transfer to [facility] with mod assist (pt = 70% of lateral scoot).   Pt completes transfer from bsc to w/c using similar method with similar results.   Pt setup to bathe at sink and complete upper body bathing unassisted, lower body with min assist to wash left lower leg and back.   Pt dons shirt but is unable to don pants d/t poor fit.  After return to bed, pt dons pants while supine with max assist to lace both legs and pull up pants while he rolls from right to left.   Overall, pt requires extra time and physical assist to perform new skills with rest breaks d/t fatigue.     Therapy Documentation Precautions:  Precautions Precautions: Fall Restrictions Weight Bearing Restrictions: Yes RLE Weight Bearing: Non weight bearing  Pain: Pain Assessment Pain Assessment: No/denies pain  See  Function Navigator for Current Functional Status.   Therapy/Group: Individual Therapy   Second session: Time: [id number] Time Calculation (min):  45 min  Pain Assessment: 3/10  Skilled Therapeutic Interventions: ADL-retraining with focus on toilet transfer and toileting skills.   After setup to place w/c beside toilet, pt educated on clothing management using lateral leans and toilet transfer to standard toilet from and to w/c.    Pt required mod vc to use grab bar and place left leg closer to target with manual facilitation to weight-shift and moderate lifting assist (pt = 70%).   Pt able to use his arms to lower and reposition assisted with use of grab bars.   OT educated pt on use of BSC over toilet at home and continued discussion with pt relating to use of additional DME (pt sleeps on his couch and may require hospital bed d/t dependent on use of rails, foot/head elevation).     See FIM for current functional status  Therapy/Group: Individual Therapy  [place] 01/19/2016, 12:39 PM

## 2016-01-20 ENCOUNTER — Other Ambulatory Visit: Payer: Self-pay | Admitting: *Deleted

## 2016-01-20 ENCOUNTER — Inpatient Hospital Stay (HOSPITAL_COMMUNITY): Payer: Commercial Managed Care - HMO | Admitting: Physical Therapy

## 2016-01-20 ENCOUNTER — Inpatient Hospital Stay (HOSPITAL_COMMUNITY): Payer: Commercial Managed Care - HMO

## 2016-01-20 LAB — GLUCOSE, CAPILLARY
GLUCOSE-CAPILLARY: 171 mg/dL — AB (ref 65–99)
GLUCOSE-CAPILLARY: 184 mg/dL — AB (ref 65–99)
Glucose-Capillary: 168 mg/dL — ABNORMAL HIGH (ref 65–99)
Glucose-Capillary: 186 mg/dL — ABNORMAL HIGH (ref 65–99)

## 2016-01-20 MED ORDER — CARVEDILOL 12.5 MG PO TABS
12.5000 mg | ORAL_TABLET | Freq: Two times a day (BID) | ORAL | Status: DC
  Administered 2016-01-20 – 2016-01-23 (×6): 12.5 mg via ORAL
  Filled 2016-01-20 (×6): qty 1

## 2016-01-20 MED ORDER — INSULIN GLARGINE 100 UNIT/ML ~~LOC~~ SOLN
27.0000 [IU] | Freq: Every day | SUBCUTANEOUS | Status: DC
  Administered 2016-01-20 – 2016-01-24 (×5): 27 [IU] via SUBCUTANEOUS
  Filled 2016-01-20 (×8): qty 0.27

## 2016-01-20 NOTE — Patient Outreach (Signed)
[place] [facility]) Care Management  01/20/2016  Thomas Wright 1945/12/27 [id number]  Phone call to patient's [name], [phone] with verbal permission from patient to discuss wheelchair ramp request.  Per Tim, he would be willing to install a wheelchair ramp for patient, however he would need a commitment from him that he will be residing in the home on a long term basis.   Plan:  This Education officer, museum will discuss housing options with patient.             Voicemail message left for [name], clinical social worker inpatient rehabilitation at [facility] to              discuss patient's discharge needs.   [name] Care Management [phone]

## 2016-01-20 NOTE — Patient Outreach (Signed)
[facility]) Care Management  01/20/2016  JOHNWILLIAM Wright 06/18/1946 [id number]   Phone call from [facility] [phone] on 01/19/15, clinical social worker, Duwayne Heck inpatient rehabilitation stating that patient will need a wheelchair ramp post discharge on 02/03/16 and requested this social worker's assistance.  Patient is status post below the knee amputation, right leg.   Phone call to patient to discuss needs, financial assessment completed.  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  Patient's finances are limited and he cannot afford to pay for a wheelchair ramp on his own.  Patient has limited social support. Per patient, he has no family that lives locally and very few friends.  Contact made with [employer], however per policy they only work with homeowners.  [place] contacted as well as [employer], voicemail message left for a return call.   Plan:  This social worker to continue to follow up with patient as well as inpatient rehabilitation social worker to explore options for discharge home.

## 2016-01-20 NOTE — Progress Notes (Signed)
Physical Therapy Session Note  Patient Details  Name: STANISLAW ACTON MRN: [id number] Date of Birth: 08/11/1946  Today's Date: 01/20/2016 PT Individual Time: 1040-1200 AND 1500-1612 PT Individual Time Calculation (min): 80 min  AND 72 min    Short Term Goals: Week 1:  PT Short Term Goal 1 (Week 1): Patient will performed Sb transfer with min A.  PT Short Term Goal 2 (Week 1): Patient will perform squat pivot transfer with mod A.  PT Short Term Goal 3 (Week 1): Patient will perform sit<>stand from [facility] with RW with mod A.  PT Short Term Goal 4 (Week 1): Patient will perform bed mobility with supervision A.  PT Short Term Goal 5 (Week 1): Patient will propell WC 150 with supervision A and min verbal instruction.   Skilled Therapeutic Interventions/Progress Updates:    Patient received supine in bed and agreeable to PT. Patient performed bed mobility with supervision A and heavy use of bed rails. Bed<>WC transfer with SB with min A from PT.   WC mobility for 1106f with supervision A. And mod cues for improved BUE propulsion and obstacle negotiation.   Mat<>WC SB transfer with supervision A from PT with moderate multimodal cues for improved UE positioning and increased push through LLE to clear gluteal region from board to prevent increased friction pressure sore.    Sit<>stand from 26 inches x 3 with max A x 2 and mod A x 1  Sit<>stand from 25 inches x 1 with max A.  Standing tolerance performed after each transfer for up to 1 min with min A from PT to maintain balance with BUE support.  PT provided max verbal, tactile and visual instruction for improved posture, increase forward weight shift improve BUE positioning and blocked L knee for improved safety with transfer and increased success.   Supine<>sit on mat table with min A without rails on mat; PT provided min verbal and tactile cueing for improved push through UE and proper LE positioning and increased safety with movement.    Supine AROM  therex  SLR x 12 BLE  Partial bridge through LLE.  Side lying hip abduction x 12 BLE  SAQ x 12  Prone AROM therex. Hip extension 2x 10BLE, with AAROM on second set of LLE due to fatigue HS curl 2x 12 BLE   For all therex PT provided min A and constant cues for improved ROM, decreased compensation from trunk, improved diaphragmatic breathing to reduce pressure on cardiovascular system and improved activation of deep core musculature. Patient responded very well to instruction but demonstrated difficulty with Eccentric control for most exercises due to decreased strength.   [facility] reach and all other needs met.   Session 2  Patient received sitting in WC and agreeable to PT. PT instructed [place] for 2027f [id number] supervision A and verbal cues for improved BUE propulsion technique and obstacle navigation in room and hall way. WC mobility training also performed on unlevel cement sidewalk for 50ft,29ft a44f5ft wi55flose supervision A and cues for increased force of Propulsion to maintain straight trajectory on sloped surface.   Patient performed sit<>stand training from [facility] Parallel bars with pull to stand technique x 4, Max A and LLE knee block by PT. Standing tolerance for up to one minute x 3 with min A from PT to prevent knee buckling.  Patient instructed in step training in parallel bars for one step with max A and max cues for UE placement, sequencing of movements, and for decreased anxiety. Able to take one, 2 inch step with max A from PT, then required rest break due to fatigue.   Patient transported to room and performed SB transfer to bed with mod A from PT. Sit<>supine transfer with supervision A from PT as well as min cues for UE positioning.   Patient left supine in bed with call bell within reach.         Therapy Documentation Precautions:   Precautions Precautions: Fall Restrictions Weight Bearing Restrictions: Yes RLE Weight Bearing: Non weight bearing   Pain: Pain Assessment Pain Assessment: 0-10 Pain Score: 3  Pain Type: Acute pain Pain Location: Leg Pain Orientation: Right Pain Descriptors / Indicators: Aching;Sharp (stinging) Pain Frequency: Occasional Pain Onset: On-going Patients Stated Pain Goal: 3 Pain Intervention(s): Medication (See eMAR);Repositioned (vicodin 2 po)   See Function Navigator for Current Functional Status.   Therapy/Group: Individual Therapy  Lorie Phenix 01/20/2016, 12:31 PM

## 2016-01-20 NOTE — Progress Notes (Signed)
Social Work Patient ID: Thomas Wright, male   DOB: 06/06/1946, 70 y.o.   MRN: [id number]   CSW met with pt to update him on team conference discussion and targeted date.  Pt was pleased to know that he would be on rehab for two weeks, but is still somewhat concerned how it will be when he goes home.  He has started to talk with someone with [facility] about ramp for his home.  CSW followed up on this and offered assistance, as needed.  Pt is worried about asking his 2-3 friends for help, as they have busy lives and he doesn't want to burden them.  CSW will continue to help pt with community resources.  

## 2016-01-20 NOTE — Patient Care Conference (Signed)
Inpatient RehabilitationTeam Conference and Plan of Care Update Date: 01/18/2016   Time: 2:00 PM    Patient Name: Thomas Wright      Medical Record Number: [id number]  Date of Birth: June 17, 1946 Sex: Male         Room/Bed: 4W03C/4W03C-01 Payor Info: Payor: HU[employer] MEDICARE / Plan: HU[employer] GOLD PLUS HMO THN/NTSP / Product Type: *No Product type* /    Admitting Diagnosis: R Bka  Admit Date/Time:  01/17/2016  4:30 PM Admission Comments: No comment available   Primary Diagnosis:  Status post below knee amputation of right lower extremity (HCC) Principal Problem: Status post below knee amputation of right lower extremity [facility])  Patient Active Problem List   Diagnosis Date Noted  . Hyponatremia   . Diarrhea 01/17/2016  . Amputation of right lower extremity below knee with complication ([place]) [id number]  . AKI (acute kidney injury) ([place])   . Type 2 diabetes mellitus with peripheral neuropathy (HCC)   . Status post aortic valve replacement   . Chronic combined systolic and diastolic congestive heart failure ([place])   . ATN (acute tubular necrosis) ([place])   . Postoperative pain   . Acute blood loss anemia   . Leukocytosis   . Morbid obesity ([place])   . Status post below knee amputation of right lower extremity ([place])   . Insulin dependent diabetes mellitus ([place]) 01/09/2016  . Diabetic foot infection ([place]) 01/09/2016  . Sepsis, unspecified organism ([place]) 01/09/2016  . PAF (paroxysmal atrial fibrillation) ([place]) 04/22/2015  . Essential hypertension 04/22/2015  . Obesity 04/22/2015  . Dyspnea 04/20/2015  . Acute on chronic diastolic CHF (congestive heart failure) ([place]) 04/19/2015  . Cellulitis of leg 11/23/2014  . Diabetes ([place]) 09/13/2014  . Chronic anticoagulation 09/13/2014  . Atrial flutter, unspecified   . Paroxysmal atrial fibrillation (HCC)   . Perirectal abscess   . Other hemorrhoids   . Tenosynovitis of finger 05/05/2014  . Cellulitis and abscess of digit 05/05/2014  . Back pain   . H/O  aortic valve replacement with tissue graft   . Hypertriglyceridemia   . Hypertension     Expected Discharge Date: Expected Discharge Date: 02/03/16  Team Members Present: Physician leading conference: Dr. Delice Lesch Social Worker Present: Alfonse Alpers, LCSW Nurse Present: Dorien Chihuahua, RN PT Present:  Barrie Folk, PT; Lavone Nian, PT) OT Present: Willeen Cass, OT SLP Present: Windell Moulding, SLP PPS Coordinator present : Daiva Nakayama, RN, CRRN     Current Status/Progress Goal Weekly Team Focus  Medical   Limited mobility secondary to right below-knee amputation 01/13/2016  Improve DM meds, BP meds, follow lab work, improve safety, transfers  See above   Bowel/Bladder   continent x2; LBM 5/2  Remain continent while in rehab  toilet patient with appropriate devices   Swallow/Nutrition/ Hydration             ADL's   Overall Min-Mod A for BADL, Mod-Max A for transfers  Overall Mod I with supervision for homemaking and shower transfers  Transfer training, dynamic standing balance, adapted bathing/dressing skills, activity tolerance, safety awareness, increased endurance/activity tolerance, BUE strengthening   Mobility   Max A for squat pivot and sit<>stand transfers. Mod A for SB transfers. unable to perform gait. min A with bed mobility  Supervision A for transfers with least restrictve AD. Min A for gait for short distances with RW.   Improved UE/LE strength. increased independence with transfers.    Communication  Safety/Cognition/ Behavioral Observations            Pain   requiring PRN vicodine every 4 hourly.  less than 4/10  assess and treat patients pain while therapy starts.   Skin   perineaum, sacrum, and bilateral thigh rash, barrier cream in use  no new break down while on rehab  continue monitoring every shift    Rehab Goals Patient on target to meet rehab goals: Yes Rehab Goals Revised: none - pt's first conference *See Care Plan and progress  notes for long and short-term goals.  Barriers to Discharge: Uncontrolled DM, ATN, safety edu, VAC    Possible Resolutions to Barriers:  Pt and family edu, optimize DM and BP meds, d/c VAC end of week    Discharge Planning/Teaching Needs:  Pt plans to return to his home, but is worried about the stairs to enter home and burdening other people by asking for help.  Pt's goals are modified independent.   Team Discussion:  Pt with diabetes and is blood sugars have been out of control, with elevated WBC, which is now trending down.  Pt's kidney injury is improving.  Pt's goals are mod I with slide board and w/c.  Pt needs a ramp as he has 9 steps in the front and 6 steps in the back of his mobile home.  Pt with right BKA and wound vac currently.  Pt would benefit from neuropsychology.  Revisions to Treatment Plan:  none   Continued Need for Acute Rehabilitation Level of Care: The patient requires daily medical management by a physician with specialized training in physical medicine and rehabilitation for the following conditions: Daily direction of a multidisciplinary physical rehabilitation program to ensure safe treatment while eliciting the highest outcome that is of practical value to the patient.: Yes Daily medical management of patient stability for increased activity during participation in an intensive rehabilitation regime.: Yes Daily analysis of laboratory values and/or radiology reports with any subsequent need for medication adjustment of medical intervention for : Wound care problems;Post surgical problems;Diabetes problems;Blood pressure problems  Thomas Wright 01/20/2016, 8:54 AM

## 2016-01-20 NOTE — Progress Notes (Signed)
[facility] Individual Statement of Services  Patient Name:  Thomas Wright  Date:  01/20/2016  Welcome to the [facility].  Our goal is to provide you with an individualized program based on your diagnosis and situation, designed to meet your specific needs.  With this comprehensive rehabilitation program, you will be expected to participate in at least 3 hours of rehabilitation therapies Monday-Friday, with modified therapy programming on the weekends.  Your rehabilitation program will include the following services:  Physical Therapy (PT), Occupational Therapy (OT), 24 hour per day rehabilitation nursing, Neuropsychology, Case Management (Social Worker), Rehabilitation Medicine, Nutrition Services and Pharmacy Services  Weekly team conferences will be held on Wednesdays to discuss your progress.  Your Social Worker will talk with you frequently to get your input and to update you on team discussions.  Team conferences with you and your family in attendance may also be held.  Expected length of stay: 14 to 16 days  Overall anticipated outcome: Modified Independent at wheelchair level  Depending on your progress and recovery, your program may change. Your Social Worker will coordinate services and will keep you informed of any changes. Your Social Worker's name and contact numbers are listed  below.  The following services may also be recommended but are not provided by the [facility] will be made to provide these services after discharge if needed.  Arrangements include referral to agencies that provide these services.  Your insurance has been verified to be:  [employer] Your primary doctor is:  Dr. Leighton Ruff  Pertinent information will be shared with your doctor and your  insurance company.  Social Worker:  Alfonse Alpers, LCSW  [phone] or ([phone]  Information discussed with and copy given to patient by: Trey Sailors, 01/20/2016, 1:54 AM

## 2016-01-20 NOTE — Progress Notes (Signed)
Occupational Therapy Session Note  Patient Details  Name: Thomas Wright MRN: [id number] Date of Birth: 12-Aug-1946  Today's Date: 01/20/2016 OT Individual Time: [id number] OT Individual Time Calculation (min): 60 min    Short Term Goals: Week 1:  OT Short Term Goal 1 (Week 1): Pt will transfer to drop arm BSC with min A OT Short Term Goal 2 (Week 1): Pt will stand for 30 seconds with steadying assist in prep for functional task OT Short Term Goal 3 (Week 1): Pt will dress LB with steadying assist OT Short Term Goal 4 (Week 1): Pt will tolerate full bathing/dressing session with 2 rest breaks in order to increase functional activity tolerance  Skilled Therapeutic Interventions/Progress Updates: ADL-retraining at shower level with focus on lateral scoot transfer bed<>w/c and w/c<>tub bench in walk-in shower.   Pt requires min vc for technique and problem-solving during transfer and setup to place w/c with steadying assist-mod assist to lift buttocks off w/c cushion.   Pt dresses in w/c at sink but requires transfer back to bed to don pants while awaiting RN to replace dressing to buttocks wound.   Pt reports weakness and fear of attempting sit<>stand at sink to pull up pants despite +2 assist available.   Pt limited in session by weakness and fatigue and required several rest breaks to recover fully after each transfer.     Therapy Documentation Precautions:  Precautions Precautions: Fall Restrictions Weight Bearing Restrictions: Yes RLE Weight Bearing: Non weight bearing  Pain: Pain Assessment Pain Assessment: 0-10 Pain Score: 3  Pain Type: Acute pain Pain Location: Leg Pain Orientation: Right Pain Descriptors / Indicators: Aching;Sharp (stinging) Pain Frequency: Occasional Pain Onset: On-going Patients Stated Pain Goal: 3 Pain Intervention(s): shower, distraction, repositioned  See Function Navigator for Current Functional Status.   Therapy/Group: Individual Therapy    [place] 01/20/2016, 12:16 PM

## 2016-01-20 NOTE — Progress Notes (Signed)
Social Work Assessment and Plan  Patient Details  Name: Thomas Wright MRN: [id number] Date of Birth: Apr 04, 1946  Today's Date: 01/19/2016  Problem List:  Patient Active Problem List   Diagnosis Date Noted  . Hyponatremia   . Diarrhea 01/17/2016  . Amputation of right lower extremity below knee with complication ([place]) 09/40/7680  . AKI (acute kidney injury) ([place])   . Type 2 diabetes mellitus with peripheral neuropathy (HCC)   . Status post aortic valve replacement   . Chronic combined systolic and diastolic congestive heart failure ([place])   . ATN (acute tubular necrosis) ([place])   . Postoperative pain   . Acute blood loss anemia   . Leukocytosis   . Morbid obesity ([place])   . Status post below knee amputation of right lower extremity ([place])   . Insulin dependent diabetes mellitus ([place]) 01/09/2016  . Diabetic foot infection ([place]) 01/09/2016  . Sepsis, unspecified organism ([place]) 01/09/2016  . PAF (paroxysmal atrial fibrillation) ([place]) 04/22/2015  . Essential hypertension 04/22/2015  . Obesity 04/22/2015  . Dyspnea 04/20/2015  . Acute on chronic diastolic CHF (congestive heart failure) ([place]) 04/19/2015  . Cellulitis of leg 11/23/2014  . Diabetes ([place]) 09/13/2014  . Chronic anticoagulation 09/13/2014  . Atrial flutter, unspecified   . Paroxysmal atrial fibrillation (HCC)   . Perirectal abscess   . Other hemorrhoids   . Tenosynovitis of finger 05/05/2014  . Cellulitis and abscess of digit 05/05/2014  . Back pain   . H/O aortic valve replacement with tissue graft   . Hypertriglyceridemia   . Hypertension    Past Medical History:  Past Medical History  Diagnosis Date  . Hypertension   . PAF (paroxysmal atrial fibrillation) ([place])     a. Postop 2008. b. recurrent afib 08/2014 with LAA clot noted on TEE. EF was down again at 25%. He was anticoagulated with Eliquis and placed on Amiodarone.He ultimately underwent TEE-DCCV 08/2014.  Marland Kitchen Hypertriglyceridemia   . Cardiomyopathy ([place])    EF originally 20%-now 45%, no CAD on cath  . Chronic combined systolic and diastolic CHF (congestive heart failure) ([place])     a. 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 Reported h/o cath in 2008 without significant CAD.  Marland Kitchen Type II diabetes mellitus ([place])   . Arthritis     .  Marland Kitchen Depression     "a little right now" (04/19/2015)  . S/P aortic valve replacement with bioprosthetic valve     a. bioprosthetic AVR in 05/2007 in [place].  . Hyperlipidemia    Past Surgical History:  Past Surgical History  Procedure Laterality Date  . Aortic valve replacement  05/2007    with tissue graft  . I&d extremity Right 05/05/2014    Procedure: IRRIGATION AND DEBRIDEMENT EXTREMITY;  Surgeon: [name], MD;  Location: [facility];  Service: Orthopedics;  Laterality: Right;  . Tee without cardioversion N/A 07/23/2014    Procedure: TRANSESOPHAGEAL ECHOCARDIOGRAM (TEE);  Surgeon: Josue Hector, MD;  Location[facility] ENDOSCOPY;  Service: Cardiovascular;  Laterality: N/A;  . Cardioversion N/A 07/23/2014    Procedure: CARDIOVERSION;  Surgeon: Josue Hector, MD;  Location: [facility] ENDOSCOPY;  Service: Cardiovascular;  Laterality: N/A;  . Tee without cardioversion N/A 09/01/2014    Procedure: TRANSESOPHAGEAL ECHOCARDIOGRAM (TEE);  Surgeon: [name], MD;  Location[facility] END[facility]COPY;  Service: Cardiovascular;  Laterality: N/A;  . Cardioversion N/A 09/01/2014    Procedure: CARDIOVERSION;  Surgeon: Elta Guadeloupe  Marlou Porch, MD;  Location: [place];  Service: Cardiovascular;  Laterality: N/A;  . Amputation Right 07/29/2014    Procedure: AMPUTATION RIGHT LONG FINGER;  Surgeon: [name], MD;  Location: [place];  Service: Orthopedics;  Laterality: Right;  . Cardiac valve replacement    . Tonsillectomy  1954  . Cardiac catheterization  "several"  . Amputation Right 01/13/2016    Procedure: RIGHT BELOW KNEE AMPUTATION;  Surgeon: Newt Minion, MD;  Location: [place];   Service: Orthopedics;  Laterality: Right;  . Application of wound vac Right 01/13/2016    Procedure: APPLICATION OF WOUND VAC;  Surgeon: Newt Minion, MD;  Location: [place];  Service: Orthopedics;  Laterality: Right;   Social History:  reports that he has never smoked. He has never used smokeless tobacco. He reports that he drinks alcohol. He reports that he does not use illicit drugs.  Family / Support Systems Marital Status: Separated How Long?: 4 years Patient Roles: Parent, Other (Comment) (friends) Spouse/Significant Other: ;  Other Supports: Leighton Parody - friend - [phone]; [phone];  Assunta Gambles - friend [phone]; [phone] Anticipated Caregiver: self Ability/Limitations of Caregiver: Despina Hick lives in [place] and works at [employer]. Caregiver Availability: Intermittent Family Dynamics: Pt has two or three friends, but he worries about burdening them and asking too much of them.  Social History Preferred language: English Religion: Baptist Read: Yes Employment Status: Retired Date Retired/Disabled/Unemployed: 2011 - but now pt is working again 43-50 hours/week at [employer] Age Retired: 64 Name of Employer: [employer] Return to Work Plans: Pt would like to return to work when he is able, but he is feeling unsure if he can really do it. Legal History/Current Legal Issues: Pt has been separated for 4 years, but has not had the financial resources to get divorced.  Has been married and divorced several times before. Guardian/Conservator: N/A - MD has stated that pt is capable of making his own decisions   Abuse/Neglect Physical Abuse: Denies Verbal Abuse: Denies Sexual Abuse: Denies Exploitation of patient/patient's resources: Denies Self-Neglect: Denies Possible abuse reported to:: [place] of social services  Emotional Status Pt's affect, behavior and adjustment status: Pt reports having some moments of "depression", but  he feels he is moving past that and is making progress emotionally.   Recent Psychosocial Issues: Pt is a little anxious about going home alone. Psychiatric History: depression reported on paperwork, but pt reports feeling better and did not report taking any medication or going to counseling Substance Abuse History: none reported  Patient / Family Perceptions, Expectations & Goals Pt/Family understanding of illness & functional limitations: Pt has a good understanding of his condition and is motivated and committed to taking better care of himself and not letting this happen again. Premorbid pt/family roles/activities: Pt works a lot and does not have a lot of time for many hobbies.   Anticipated changes in roles/activities/participation: Pt knows he will not be able to drive and cannot work for a while.  He is worried about finances. Pt/family expectations/goals: Pt wants to get his independence back and stay on rehab until he can take care of himself at home.  Community Resources [employer]: Other (Comment) Patent attorney) Premorbid Home Care/DME Agencies: None Transportation available at discharge: friends Resource referrals recommended: Neuropsychology, Support group (specify)  Discharge Planning Living Arrangements: Alone Support Systems: Children, Friends/neighbors, Case manager/social worker Case Manager/Social Worker Name: [place] - CM - [phone]; [place] - [phone] Case Manager/Social Worker Agency: [facility] Type of Residence: Private residence [employer]: Multimedia programmer (specify) ([employer] Medicare) Museum/gallery curator Resources: Employment, Radio broadcast assistant Screen Referred: No Living Expenses: Education officer, community Management: Patient Does the patient have any problems obtaining your medications?: Yes (Describe) (financial stressors) Home Management: Pt has been taking care of this PTA, but he knows it will be more  difficult now. Patient/Family Preliminary Plans: Pt plans to return to his home initially, but with eventual move to a smaller apartment with fewer or no stairs. Barriers to Discharge: Steps, Self care, Finances, Family Support Social Work Anticipated Follow Up Needs: HH/OP Expected length of stay: 14 - 18 days  Clinical Impression CSW met with pt to introduce self and role of CSW, in addition to completing assessment.  Pt was glad that CSW came to visit, as he needed help with getting his short term disability paperwork completed.  CSW helped pt with his portion and had the PA, Marlowe Shores, to complete the medical part.  Pt will have his employer to come pick up this paperwork.  Pt is concerned about asking his friends for help, so he's trying to figure out how to make things work at home.  He is already connected with [facility] and they are working on getting him a ramp installed and can also help to arrange medical transportation.  CSW will also help him to arrange therapies and DME closer to d/c.  CSW asked that amputee peer support person come to meet with pt.  CSW will continue to work with pt to make sure plans are in place for his d/c and to support him during his rehab stay.  Niaomi Cartaya, Silvestre Mesi 01/20/2016, 2:44 AM

## 2016-01-20 NOTE — Progress Notes (Signed)
[facility] PHYSICAL MEDICINE & REHABILITATION     PROGRESS NOTE  Subjective/Complaints:  Patient sitting up in bed this morning. He is most pleasant this morning. He states he slept well overnight.   ROS: Denies phantom limb pain, CP, SOB, N/V/D.   Objective: Vital Signs: Blood pressure 159/68, pulse 78, temperature 98.2 F (36.8 C), temperature source Oral, resp. rate 16, height 6\' 1"  (1.854 m), weight 98.4 kg (216 lb 14.9 oz), SpO2 96 %. No results found.  Recent Labs  01/18/16 0429  WBC 13.0*  HGB 10.5*  HCT 32.8*  PLT 378    Recent Labs  01/18/16 0429  NA 134*  K 3.9  CL 105  GLUCOSE 213*  BUN 19  CREATININE 1.82*  CALCIUM 7.6*   CBG (last 3)   Recent Labs  01/19/16 1651 01/19/16 2032 01/20/16 0628  GLUCAP 197* 182* 168*    Wt Readings from Last 3 Encounters:  01/20/16 98.4 kg (216 lb 14.9 oz)  01/17/16 111.721 kg (246 lb 4.8 oz)  01/04/16 106.595 kg (235 lb)    Physical Exam:  BP 159/68 mmHg  Pulse 78  Temp(Src) 98.2 F (36.8 C) (Oral)  Resp 16  Ht 6\' 1"  (1.854 m)  Wt 98.4 kg (216 lb 14.9 oz)  BMI 28.63 kg/m2  SpO2 96% Constitutional: He appears well-developed. Obese. NAD HENT: oral mucosa pink and moist Head: Normocephalic and atraumatic.  Eyes: Conjunctivae and EOM are normal.  Cardiovascular: Normal rate and regular rhythm. no murmurs  Respiratory: Effort normal and breath sounds normal. No respiratory distress. No wheezes or rales. GI: Soft. Bowel sounds are normal. He exhibits no distension.  Musculoskeletal: He exhibits mild tenderness. He exhibits no edema.  Neurological: He is alert and oriented.  Motor: Bilateral upper extremity 4+/5 proximal to distal Left lower extremity hip flexion, knee extension 5/5, ankle dorsi/plantar flexion 5/5 Right lower extremity hip flexion: 4 +/5 (pain inhibition)  Skin:  BKA with wound VAC in place  Psychiatric: He has a normal mood and affect. His behavior is normal  Assessment/Plan: 1.  Functional deficits secondary to right BKA which require 3+ hours per day of interdisciplinary therapy in a comprehensive inpatient rehab setting. Physiatrist is providing close team supervision and 24 hour management of active medical problems listed below. Physiatrist and rehab team continue to assess barriers to discharge/monitor patient progress toward functional and medical goals.  Function:  Bathing Bathing position   Position: Wheelchair/chair at sink  Bathing parts Body parts bathed by patient: Right arm, Left arm, Chest, Abdomen, Front perineal area, Right upper leg, Left upper leg Body parts bathed by helper: Back  Bathing assist Assist Level: Touching or steadying assistance(Pt > 75%)      Upper Body Dressing/Undressing Upper body dressing   What is the patient wearing?: Pull over shirt/dress     Pull over shirt/dress - Perfomed by patient: Thread/unthread right sleeve, Thread/unthread left sleeve, Put head through opening, Pull shirt over trunk          Upper body assist Assist Level: Set up   Set up : To obtain clothing/put away  Lower Body Dressing/Undressing Lower body dressing   What is the patient wearing?: Pants, Socks       Pants- Performed by helper: Thread/unthread right pants leg, Thread/unthread left pants leg, Pull pants up/down     Socks - Performed by patient: Don/doff left sock (R n/a) Socks - Performed by helper: Don/doff left sock  Lower body assist        Toileting Toileting   Toileting steps completed by patient: Adjust clothing prior to toileting, Performs perineal hygiene Toileting steps completed by helper: Adjust clothing after toileting    Toileting assist Assist level: Touching or steadying assistance (Pt.75%)   Transfers Chair/bed transfer   Chair/bed transfer method: Lateral scoot Chair/bed transfer assist level: Touching or steadying assistance (Pt > 75%) Chair/bed transfer assistive device: Armrests,  Sliding board     Locomotion Ambulation Ambulation activity did not occur: Safety/medical concerns         Wheelchair   Type: Manual Max wheelchair distance: 150 Assist Level: Supervision or verbal cues  Cognition Comprehension Comprehension assist level: Follows basic conversation/direction with no assist  Expression Expression assist level: Expresses basic needs/ideas: With no assist  Social Interaction Social Interaction assist level: Interacts appropriately with others - No medications needed.  Problem Solving Problem solving assist level: Solves complex problems: With extra time  Memory Memory assist level: Complete Independence: No helper    Medical Problem List and Plan: 1. 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 DVT Prophylaxis/Anticoagulation: Continue Eliquis as prior to admission. 3. Pain Management: Hydrocodone and Robaxin as needed. Monitor with increased mobility 4. Diabetes mellitus with peripheral neuropathy. Hemoglobin A1c 12.6.   Lantus insulin 22 units daily, Increased to 27 units on 5/5.   Check blood sugars before meals and at bedtime. Diabetic teaching  NovoLog 3 units 3 times a day with meals added on 5/4  Will cont to monitor and adjust with increased actvity 5. Neuropsych: This patient is capable of making decisions on his own behalf. 6. Skin/Wound Care: Routine skin checks.   Wound VAC remain in place 1 week post-op, will DC tomorrow 7. Fluids/Electrolytes/Nutrition: Routine I&O   Mild hyponatremia: 134 on 5/3, will cont to monitor 8.Leukocytosis. Slowly improving. Maxipime completed 01/17/2016  WBCs 13.0 on 5/3  Cont to monitor 9. Hypertension/PAF.    Amiodarone 200 mg daily  Coreg 6.25 mg twice a day, will increase to 12.5 on 5/5  Will cont to monitor and consider further adjustments if necessary 10. Chronic combined systolic and diastolic congestive  heart failure. No signs of fluid overload. Weigh patient daily  Weight stable around 98 KG 11. Acute renal insufficiency/presumably ischemic ATN. Renal ultrasound unremarkable.   Renal service is following, appreciate recs  Cr 1.82 on 5/3 12. Hyperlipidemia. Lipitor 13. ABLA  Hb 10.5 on 5/3  Cont to monitor  LOS (Days) 3 A FACE TO FACE EVALUATION WAS PERFORMED  Ankit Lorie Phenix 01/20/2016 9:09 AM

## 2016-01-21 ENCOUNTER — Inpatient Hospital Stay (HOSPITAL_COMMUNITY): Payer: Commercial Managed Care - HMO

## 2016-01-21 ENCOUNTER — Inpatient Hospital Stay (HOSPITAL_COMMUNITY): Payer: Commercial Managed Care - HMO | Admitting: Physical Therapy

## 2016-01-21 ENCOUNTER — Inpatient Hospital Stay (HOSPITAL_COMMUNITY): Payer: Commercial Managed Care - HMO | Admitting: Occupational Therapy

## 2016-01-21 DIAGNOSIS — S88111S Complete traumatic amputation at level between knee and ankle, right lower leg, sequela: Secondary | ICD-10-CM

## 2016-01-21 LAB — GLUCOSE, CAPILLARY
GLUCOSE-CAPILLARY: 90 mg/dL (ref 65–99)
GLUCOSE-CAPILLARY: 176 mg/dL — AB (ref 65–99)
GLUCOSE-CAPILLARY: 152 mg/dL — AB (ref 65–99)
Glucose-Capillary: 156 mg/dL — ABNORMAL HIGH (ref 65–99)
Glucose-Capillary: 83 mg/dL (ref 65–99)

## 2016-01-21 NOTE — Progress Notes (Signed)
Occupational Therapy Session Note  Patient Details  Name: Thomas Wright MRN: [id number] Date of Birth: 1946-01-27  Today's Date: 01/21/2016 OT Individual Time: 1115-1145 OT Individual Time Calculation (min): 30 min    Short Term Goals: Week 1:  OT Short Term Goal 1 (Week 1): Pt will transfer to drop arm BSC with min A OT Short Term Goal 2 (Week 1): Pt will stand for 30 seconds with steadying assist in prep for functional task OT Short Term Goal 3 (Week 1): Pt will dress LB with steadying assist OT Short Term Goal 4 (Week 1): Pt will tolerate full bathing/dressing session with 2 rest breaks in order to increase functional activity tolerance  Skilled Therapeutic Interventions/Progress Updates:    OT session focused on activity tolerance, sitting balance, and functional transfers. Pt received sitting in w/c requiring cues of encouragement for participation due to fatigue. Pt completed grooming task and UB dressing at sink with setup assist. OT positioned pt away from sink to facilitate anterior weight shift and core strength. Pt completed SBT w/c>bed with min A and verbal cues for setup. Pt transitioned to supine with supervision and declined bed level exercises. Pt left supine in bed with all needs in reach.  Therapy Documentation Precautions:  Precautions Precautions: Fall Restrictions Weight Bearing Restrictions: Yes RLE Weight Bearing: Non weight bearing General: General OT Amount of Missed Time: 15 Minutes Vital Signs: Therapy Vitals Pulse Rate: 75 BP: (!) 133/57 mmHg Pain: Pain Assessment Pain Assessment: 0-10 Pain Score: 8  Pain Type: Surgical pain Pain Location: Leg Pain Orientation: Right Pain Descriptors / Indicators: Aching Pain Frequency: Intermittent Pain Onset: On-going Pain Intervention(s): Medication (See eMAR)  See Function Navigator for Current Functional Status.   Therapy/Group: Individual Therapy  Duayne Cal 01/21/2016, 11:46 AM

## 2016-01-21 NOTE — Progress Notes (Signed)
Occupational Therapy Session Note  Patient Details  Name: Thomas Wright MRN: [id number] Date of Birth: May 29, 1946  Today's Date: 01/21/2016 OT Individual Time: -  1400-1500  (60 min)  Short Term Goals: Week 1:  OT Short Term Goal 1 (Week 1): Pt will transfer to drop arm BSC with min A OT Short Term Goal 2 (Week 1): Pt will stand for 30 seconds with steadying assist in prep for functional task OT Short Term Goal 3 (Week 1): Pt will dress LB with steadying assist OT Short Term Goal 4 (Week 1): Pt will tolerate full bathing/dressing session with 2 rest breaks in order to increase functional activity tolerance     Skilled Therapeutic Interventions/Progress Updates:     .OT addressed basic functional mobility, uE AROM, transfers, functional transfers, bed mobility, standing balance.  Pt went from supine to sit with SBA and transferred to wc with Sliding board and min assist for set up and safety. Pt donned foot pedals with min assist and instructional cues.   Pt propelled wc to gym.    Pt. Used arm bike for 10 minutes at 3 work load.  Instructed pt on pursed lip breathing during session.  Pt limited in session by weakness and fatigue and required several rest breaks to recover fully after each transfer.       Therapy Documentation Precautions:  Precautions Precautions: Fall Restrictions Weight Bearing Restrictions: Yes RLE Weight Bearing: Non weight bearing    Pain: Pain Assessment Pain Assessment: 0-10 Pain Score: 5/10  Pain Type: Surgical pain Pain Location: Leg Pain Orientation: Right Pain Descriptors / Indicators: Aching Pain Frequency: Intermittent Pain Onset: On-going Pain Intervention(s): Medication (See eMAR)      See Function Navigator for Current Functional Status.   Therapy/Group: Individual Therapy  Lisa Roca 01/21/2016, 12:52 PM

## 2016-01-21 NOTE — Progress Notes (Signed)
Physical Therapy Session Note  Patient Details  Name: Thomas Wright MRN: [id number] Date of Birth: January 04, 1946  Today's Date: 01/21/2016 PT Individual Time: 0900-1000 AND 1545-1631 PT Individual Time Calculation (min): 60 min AND 46 min  Short Term Goals: Week 1:  PT Short Term Goal 1 (Week 1): Patient will performed Sb transfer with min A.  PT Short Term Goal 2 (Week 1): Patient will perform squat pivot transfer with mod A.  PT Short Term Goal 3 (Week 1): Patient will perform sit<>stand from [facility] with RW with mod A.  PT Short Term Goal 4 (Week 1): Patient will perform bed mobility with supervision A.  PT Short Term Goal 5 (Week 1): Patient will propell WC 150 with supervision A and min verbal instruction.   Skilled Therapeutic Interventions/Progress Updates:    Patient received supine in bed and agreeable to PT. Patient performed bed mobility including roll R and L to don pants with supine to sit with supervision A with min use of bed rails. Bed<>WC SB transfer with min A from PT. WC mobility with supervision A and cues for improved hand placement on wheel rims from greater grip and control with steering.  WC<>mat Squat pivot level transfer training with max A x 3 and mod A x 1. Mod verbal and and tactile cues for proper UE placement and LE placement for increased success and increase push through LE to increase clearance.  Patient also instructed in sit<>supine transfer on mat table with supervision-min A from PT with min cues for improved awareness of RLE to prevent WB on residual limb. And improved placement on BUE to push from sidelying to sitting.   Seated therex  Press ups with handles x 5 with 10 second hold.  LAQ x 2 12 Hip abduction x 12 with level 2 tband  HS curl on LLE x 12 with level 2 tband ] Seated marches x 12 with level 2 tabnd   Supine therex    Hip extension 2x12 with push through things on bolster.  PT provided supervision A for all therex with verbal cues for improved  ROM, and increased control with eccentric movement as well as cues for improved diaphragmatic breathing.   Returned to rom in [facility] and left sitting thw call bell within reach.   Session 2  Patient received sitting in WC and agreeable to PT. Patient transported to gym in [facility] for energy conservation. Patient performed seated press-ups in WC x 5 with 5 second hold with mod cues for improved forward weight shift and increased use of LE.  Sit<>stand in parallel bars x 4 with max A from PT with pull to stand technique on bars. Patient attempted to push on Parallel bars for sit to stand x 5, but only able to perform partial sit<>stand due to decreased UE strength. PT required to block knee to prevent buckle for all sit<>stand transfers.   SB transfer to 24 inch mat table x2 to prepare to SB transfer to nustep. SB transfer to and from nustep x 1. Patient required min A from PT and cues for improved use of LE for all transfers. Nustep level 4 x 5 minutes with 2 rest breaks and step speed <30. Cues to increase step speed with little change in technique from pt.   Patient returned to room a in [facility]. WC>bed SB transfer with mod A from PT. And sit<>supine transfer with supervision A from PT and min use of bed rails. Patient left supine in bed  with call bell within reach.     Therapy Documentation Precautions:  Precautions Precautions: Fall Restrictions Weight Bearing Restrictions: Yes RLE Weight Bearing: Non weight bearing General:   Vital Signs: Therapy Vitals Pulse Rate: 75 BP: (!) 133/57 mmHg Pain: Pain Assessment Pain Assessment: 0-10 Pain Score: 3  Pain Type: Surgical pain Pain Location: Leg Pain Orientation: Right Pain Descriptors / Indicators: Aching Pain Frequency: Intermittent Pain Onset: On-going Pain Intervention(s): Medication (See eMAR)  See Function Navigator for Current Functional Status.   Therapy/Group: Individual Therapy  Lorie Phenix 01/21/2016, 10:02 AM

## 2016-01-21 NOTE — Progress Notes (Signed)
Patient reported feeling "not right" after lunch. Ate half of lunch but stated he felt like he was getting low blood sugar. CBG checked was 90, but patient states his doctor said he should stay in 150 range. Given juice. Felt better after drink and snack. Was able to participate in therapy.

## 2016-01-21 NOTE — Progress Notes (Signed)
[facility]     PROGRESS NOTE  Subjective/Complaints:  Patient seen sleeping in bed this point. He is easily arousable. He is  ROS: Denies phantom limb pain, CP, SOB, N/V/D.   Objective: Vital Signs: Blood pressure 131/58, pulse 83, temperature 98.5 F (36.9 C), temperature source Oral, resp. rate 17, height 6\' 1"  (1.854 m), weight 98.4 kg (216 lb 14.9 oz), SpO2 95 %. No results found. No results for input(s): WBC, HGB, HCT, PLT in the last 72 hours. No results for input(s): NA, K, CL, GLUCOSE, BUN, CREATININE, CALCIUM in the last 72 hours.  Invalid input(s): CO CBG (last 3)   Recent Labs  01/20/16 1706 01/20/16 2043 01/21/16 0652  GLUCAP 184* 186* 156*    Wt Readings from Last 3 Encounters:  01/20/16 98.4 kg (216 lb 14.9 oz)  01/17/16 111.721 kg (246 lb 4.8 oz)  01/04/16 106.595 kg (235 lb)    Physical Exam:  BP 131/58 mmHg  Pulse 83  Temp(Src) 98.5 F (36.9 C) (Oral)  Resp 17  Ht 6\' 1"  (1.854 m)  Wt 98.4 kg (216 lb 14.9 oz)  BMI 28.63 kg/m2  SpO2 95% Constitutional: He appears well-developed. Obese. NAD HENT: oral mucosa pink and moist Head: Normocephalic and atraumatic.  Eyes: Conjunctivae and EOM are normal.  Cardiovascular: Normal rate and regular rhythm. no murmurs  Respiratory: Effort normal and breath sounds normal. No respiratory distress. No wheezes or rales. GI: Soft. Bowel sounds are normal. He exhibits no distension.  Musculoskeletal: He exhibits mild tenderness. He exhibits no edema.  Neurological: He is alert and oriented.  Motor: Bilateral upper extremity 4+/5 proximal to distal Left lower extremity hip flexion, knee extension 5/5, ankle dorsi/plantar flexion 5/5 Right lower extremity hip flexion: 4 +/5 (pain inhibition)  Skin:  BKA with wound VAC in place  Psychiatric: He has a normal mood and affect. His behavior is normal  Assessment/Plan: 1. Functional deficits secondary to right BKA which require 3+  hours per day of interdisciplinary therapy in a comprehensive inpatient rehab setting. Physiatrist is providing close team supervision and 24 hour management of active medical problems listed below. Physiatrist and rehab team continue to assess barriers to discharge/monitor patient progress toward functional and medical goals.  Function:  Bathing Bathing position   Position: Shower  Bathing parts Body parts bathed by patient: Right arm, Left arm, Chest, Abdomen, Front perineal area, Right upper leg, Left upper leg, Buttocks Body parts bathed by helper: Back, Left lower leg  Bathing assist Assist Level: Touching or steadying assistance(Pt > 75%)      Upper Body Dressing/Undressing Upper body dressing   What is the patient wearing?: Pull over shirt/dress     Pull over shirt/dress - Perfomed by patient: Thread/unthread right sleeve, Thread/unthread left sleeve, Put head through opening, Pull shirt over trunk          Upper body assist Assist Level: Set up   Set up : To obtain clothing/put away  Lower Body Dressing/Undressing Lower body dressing   What is the patient wearing?: Pants, Socks       Pants- Performed by helper: Thread/unthread right pants leg, Thread/unthread left pants leg, Pull pants up/down     Socks - Performed by patient: Don/doff left sock (R n/a) Socks - Performed by helper: Don/doff left sock              Lower body assist        Toileting Toileting   Toileting steps completed  by patient: Adjust clothing prior to toileting, Performs perineal hygiene Toileting steps completed by helper: Adjust clothing prior to toileting, Performs perineal hygiene, Adjust clothing after toileting    Toileting assist Assist level: Touching or steadying assistance (Pt.75%)   Transfers Chair/bed transfer   Chair/bed transfer method: Lateral scoot Chair/bed transfer assist level: Touching or steadying assistance (Pt > 75%) Chair/bed transfer assistive device:  Sliding board     Locomotion Ambulation Ambulation activity did not occur: Safety/medical concerns         Wheelchair   Type: Manual Max wheelchair distance: 200 Assist Level: Supervision or verbal cues  Cognition Comprehension Comprehension assist level: Follows basic conversation/direction with no assist  Expression Expression assist level: Expresses basic needs/ideas: With no assist  Social Interaction Social Interaction assist level: Interacts appropriately with others with medication or extra time (anti-anxiety, antidepressant).  Problem Solving Problem solving assist level: Solves basic problems with no assist  Memory Memory assist level: Complete Independence: No helper    Medical Problem List and Plan: 1. 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 DVT Prophylaxis/Anticoagulation: Continue Eliquis as prior to admission. 3. Pain Management: Hydrocodone and Robaxin as needed. Monitor with increased mobility 4. Diabetes mellitus with peripheral neuropathy. Hemoglobin A1c 12.6.   Lantus insulin 22 units daily, Increased to 27 units on 5/5.   Check blood sugars before meals and at bedtime. Diabetic teaching  NovoLog 3 units 3 times a day with meals added on 5/4  Will cont to monitor and adjust with increased actvity 5. Neuropsych: This patient is capable of making decisions on his own behalf. 6. Skin/Wound Care: Routine skin checks.   Wound VAC remain in place 1 week post-op, will DC Today 7. Fluids/Electrolytes/Nutrition: Routine I&O   Mild hyponatremia: 134 on 5/3  labs ordered for Monday 8.Leukocytosis. Slowly improving. Maxipime completed 01/17/2016  WBCs 13.0 on 5/3  Cont to monitor  labs ordered for Monday 9. Hypertension/PAF.    Amiodarone 200 mg daily  Coreg 6.25 mg twice a day, increased to 12.5 on 5/5  Will cont to monitor and consider further adjustments if  necessary 10. Chronic combined systolic and diastolic congestive heart failure. No signs of fluid overload. Weigh patient daily  Weight stable around 98 KG, no weights for today 11. Acute renal insufficiency/presumably ischemic ATN. Renal ultrasound unremarkable.   Cr 1.82 on 5/3  labs ordered for Monday 12. Hyperlipidemia. Lipitor 13. ABLA  Hb 10.5 on 5/3  Cont to monitor  labs ordered for Monday  LOS (Days) 4 A FACE TO FACE EVALUATION WAS PERFORMED  Mckinnon Glick Lorie Phenix 01/21/2016 8:34 AM

## 2016-01-22 LAB — CBC
HEMATOCRIT: 31.1 % — AB (ref 39.0–52.0)
HEMOGLOBIN: 9.7 g/dL — AB (ref 13.0–17.0)
MCH: 26.4 pg (ref 26.0–34.0)
MCHC: 31.2 g/dL (ref 30.0–36.0)
MCV: 84.7 fL (ref 78.0–100.0)
Platelets: 362 10*3/uL (ref 150–400)
RBC: 3.67 MIL/uL — AB (ref 4.22–5.81)
RDW: 14.2 % (ref 11.5–15.5)
WBC: 7.4 10*3/uL (ref 4.0–10.5)

## 2016-01-22 LAB — GLUCOSE, CAPILLARY
GLUCOSE-CAPILLARY: 87 mg/dL (ref 65–99)
GLUCOSE-CAPILLARY: 158 mg/dL — AB (ref 65–99)
GLUCOSE-CAPILLARY: 157 mg/dL — AB (ref 65–99)
Glucose-Capillary: 160 mg/dL — ABNORMAL HIGH (ref 65–99)

## 2016-01-22 NOTE — Progress Notes (Signed)
[place] PHYSICAL MEDICINE & REHABILITATION     PROGRESS NOTE  Subjective/Complaints:  Patient seen lying in bed this morning. His wound VAC is noted to still be on.  ROS: Denies phantom limb pain, CP, SOB, N/V/D.   Objective: Vital Signs: Blood pressure 137/60, pulse 77, temperature 97.6 F (36.4 C), temperature source Oral, resp. rate 17, height 6\' 1"  (1.854 m), weight 98.4 kg (216 lb 14.9 oz), SpO2 96 %. No results found.  Recent Labs  01/22/16 0630  WBC 7.4  HGB 9.7*  HCT 31.1*  PLT 362   No results for input(s): NA, K, CL, GLUCOSE, BUN, CREATININE, CALCIUM in the last 72 hours.  Invalid input(s): CO CBG (last 3)   Recent Labs  01/21/16 1650 01/21/16 2037 01/22/16 0636  GLUCAP 176* 152* 87    Wt Readings from Last 3 Encounters:  01/20/16 98.4 kg (216 lb 14.9 oz)  01/17/16 111.721 kg (246 lb 4.8 oz)  01/04/16 106.595 kg (235 lb)    Physical Exam:  BP 137/60 mmHg  Pulse 77  Temp(Src) 97.6 F (36.4 C) (Oral)  Resp 17  Ht 6\' 1"  (1.854 m)  Wt 98.4 kg (216 lb 14.9 oz)  BMI 28.63 kg/m2  SpO2 96% Constitutional: He appears well-developed. Obese. NAD HENT: oral mucosa pink and moist Head: Normocephalic and atraumatic.  Eyes: Conjunctivae and EOM are normal.  Cardiovascular: Normal rate and regular rhythm. no murmurs  Respiratory: Effort normal and breath sounds normal. No respiratory distress. No wheezes or rales. GI: Soft. Bowel sounds are normal. He exhibits no distension.  Musculoskeletal: He exhibits No tenderness. He exhibits no edema.  Neurological: He is alert and oriented.  Motor: Bilateral upper extremity 4+/5 proximal to distal Left lower extremity hip flexion, knee extension 5/5, ankle dorsi/plantar flexion 5/5 Right lower extremity hip flexion: 4 +/5  Skin:  BKA with wound VAC in place  Psychiatric: He has a normal mood and affect. His behavior is normal  Assessment/Plan: 1. Functional deficits secondary to right BKA which require 3+  hours per day of interdisciplinary therapy in a comprehensive inpatient rehab setting. Physiatrist is providing close team supervision and 24 hour management of active medical problems listed below. Physiatrist and rehab team continue to assess barriers to discharge/monitor patient progress toward functional and medical goals.  Function:  Bathing Bathing position   Position: Shower  Bathing parts Body parts bathed by patient: Right arm, Left arm, Chest, Abdomen, Front perineal area, Right upper leg, Left upper leg, Buttocks Body parts bathed by helper: Back, Left lower leg  Bathing assist Assist Level: Touching or steadying assistance(Pt > 75%)      Upper Body Dressing/Undressing Upper body dressing   What is the patient wearing?: Pull over shirt/dress     Pull over shirt/dress - Perfomed by patient: Thread/unthread right sleeve, Thread/unthread left sleeve, Put head through opening, Pull shirt over trunk          Upper body assist Assist Level: Set up   Set up : To obtain clothing/put away  Lower Body Dressing/Undressing Lower body dressing   What is the patient wearing?: Pants, Socks       Pants- Performed by helper: Thread/unthread right pants leg, Thread/unthread left pants leg, Pull pants up/down     Socks - Performed by patient: Don/doff left sock (R n/a) Socks - Performed by helper: Don/doff left sock              Lower body assist  Toileting Toileting   Toileting steps completed by patient: Adjust clothing prior to toileting, Performs perineal hygiene Toileting steps completed by helper: Adjust clothing prior to toileting, Performs perineal hygiene, Adjust clothing after toileting    Toileting assist Assist level: Touching or steadying assistance (Pt.75%)   Transfers Chair/bed transfer   Chair/bed transfer method: Lateral scoot Chair/bed transfer assist level: Moderate assist (Pt 50 - 74%/lift or lower) Chair/bed transfer assistive device:  Sliding board, Armrests     Locomotion Ambulation Ambulation activity did not occur: Safety/medical concerns   Max distance: 18ft Assist level: Maximal assist (Pt 25 - 49%)   Wheelchair   Type: Manual Max wheelchair distance: 150 Assist Level: Supervision or verbal cues  Cognition Comprehension Comprehension assist level: Follows basic conversation/direction with no assist  Expression Expression assist level: Expresses basic needs/ideas: With no assist  Social Interaction Social Interaction assist level: Interacts appropriately with others with medication or extra time (anti-anxiety, antidepressant).  Problem Solving Problem solving assist level: Solves basic problems with no assist  Memory Memory assist level: Complete Independence: No helper    Medical Problem List and Plan: 1. 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 DVT Prophylaxis/Anticoagulation: Continue Eliquis as prior to admission. 3. Pain Management: Hydrocodone and Robaxin as needed. Monitor with increased mobility 4. Diabetes mellitus with peripheral neuropathy. Hemoglobin A1c 12.6.   Lantus insulin 22 units daily, Increased to 27 units on 5/5.   Check blood sugars before meals and at bedtime. Diabetic teaching  NovoLog 3 units 3 times a day with meals added on 5/4  Proving  Will cont to monitor and adjust with increased actvity 5. Neuropsych: This patient is capable of making decisions on his own behalf. 6. Skin/Wound Care: Routine skin checks.   Wound VAC remain in place 1 week post-op, plan was to be seen yesterday, but was not DC. Spoke to nursing today regarding DC of VAC 7. Fluids/Electrolytes/Nutrition: Routine I&O   Mild hyponatremia: 134 on 5/3  labs ordered for Monday 8.Leukocytosis. Slowly improving. Maxipime completed 01/17/2016  Appears to have resolved, WBCs 7.4 on 5/7  Cont to monitor  labs ordered for  Monday 9. Hypertension/PAF.    Amiodarone 200 mg daily  Coreg 6.25 mg twice a day, increased to 12.5 on 5/5  Will cont to monitor and consider further adjustments if necessary 10. Chronic combined systolic and diastolic congestive heart failure. No signs of fluid overload. Weigh patient daily  Weight stable around 98 KG on 5/5, no weights for today or yesterday 11. Acute renal insufficiency/presumably ischemic ATN. Renal ultrasound unremarkable.   Cr 1.82 on 5/3  labs ordered for Monday 12. Hyperlipidemia. Lipitor 13. ABLA  Hb 9.7 on 5/7  Cont to monitor  labs ordered for Monday  LOS (Days) 5 A FACE TO FACE EVALUATION WAS PERFORMED  Ankit Lorie Phenix 01/22/2016 7:26 AM

## 2016-01-23 ENCOUNTER — Inpatient Hospital Stay (HOSPITAL_COMMUNITY): Payer: Commercial Managed Care - HMO

## 2016-01-23 ENCOUNTER — Inpatient Hospital Stay (HOSPITAL_COMMUNITY): Payer: Commercial Managed Care - HMO | Admitting: Physical Therapy

## 2016-01-23 ENCOUNTER — Encounter (HOSPITAL_COMMUNITY): Payer: Commercial Managed Care - HMO

## 2016-01-23 LAB — CBC WITH DIFFERENTIAL/PLATELET
Basophils Absolute: 0 10*3/uL (ref 0.0–0.1)
Basophils Relative: 0 %
EOS ABS: 0.2 10*3/uL (ref 0.0–0.7)
Eosinophils Relative: 2 %
HEMATOCRIT: 29.6 % — AB (ref 39.0–52.0)
HEMOGLOBIN: 9.4 g/dL — AB (ref 13.0–17.0)
LYMPHS ABS: 1.6 10*3/uL (ref 0.7–4.0)
Lymphocytes Relative: 22 %
MCH: 27 pg (ref 26.0–34.0)
MCHC: 31.8 g/dL (ref 30.0–36.0)
MCV: 85.1 fL (ref 78.0–100.0)
MONO ABS: 0.7 10*3/uL (ref 0.1–1.0)
MONOS PCT: 9 %
NEUTROS PCT: 67 %
Neutro Abs: 5.1 10*3/uL (ref 1.7–7.7)
Platelets: 367 10*3/uL (ref 150–400)
RBC: 3.48 MIL/uL — ABNORMAL LOW (ref 4.22–5.81)
RDW: 14.2 % (ref 11.5–15.5)
WBC: 7.6 10*3/uL (ref 4.0–10.5)

## 2016-01-23 LAB — BASIC METABOLIC PANEL
ANION GAP: 7 (ref 5–15)
BUN: 22 mg/dL — ABNORMAL HIGH (ref 6–20)
CO2: 29 mmol/L (ref 22–32)
Calcium: 8 mg/dL — ABNORMAL LOW (ref 8.9–10.3)
Chloride: 99 mmol/L — ABNORMAL LOW (ref 101–111)
Creatinine, Ser: 1.48 mg/dL — ABNORMAL HIGH (ref 0.61–1.24)
GFR calc non Af Amer: 47 mL/min — ABNORMAL LOW (ref >60)
GFR, EST AFRICAN AMERICAN: 54 mL/min — AB (ref >60)
Glucose, Bld: 166 mg/dL — ABNORMAL HIGH (ref 65–99)
POTASSIUM: 4.9 mmol/L (ref 3.5–5.1)
Sodium: 135 mmol/L (ref 135–145)

## 2016-01-23 LAB — GLUCOSE, CAPILLARY
Glucose-Capillary: 109 mg/dL — ABNORMAL HIGH (ref 65–99)
Glucose-Capillary: 173 mg/dL — ABNORMAL HIGH (ref 65–99)
Glucose-Capillary: 91 mg/dL (ref 65–99)
Glucose-Capillary: 236 mg/dL — ABNORMAL HIGH (ref 65–99)

## 2016-01-23 MED ORDER — CARVEDILOL 25 MG PO TABS
25.0000 mg | ORAL_TABLET | Freq: Two times a day (BID) | ORAL | Status: DC
  Administered 2016-01-23 – 2016-02-03 (×22): 25 mg via ORAL
  Filled 2016-01-23 (×22): qty 1

## 2016-01-23 MED ORDER — INSULIN ASPART 100 UNIT/ML ~~LOC~~ SOLN
5.0000 [IU] | Freq: Three times a day (TID) | SUBCUTANEOUS | Status: DC
  Administered 2016-01-23 – 2016-01-24 (×3): 5 [IU] via SUBCUTANEOUS

## 2016-01-23 NOTE — Progress Notes (Signed)
[place] PHYSICAL MEDICINE & REHABILITATION     PROGRESS NOTE  Subjective/Complaints:  Patient sitting up in bed this morning. He was VAC was removed yesterday. He states his stump is starting to hurt and it is time for pain medications.  ROS: Denies phantom limb pain, CP, SOB, N/V/D.   Objective: Vital Signs: Blood pressure 164/87, pulse 79, temperature 98.1 F (36.7 C), temperature source Oral, resp. rate 19, height 6\' 1"  (1.854 m), weight 113.989 kg (251 lb 4.8 oz), SpO2 96 %. No results found.  Recent Labs  01/22/16 0630  WBC 7.4  HGB 9.7*  HCT 31.1*  PLT 362   No results for input(s): NA, K, CL, GLUCOSE, BUN, CREATININE, CALCIUM in the last 72 hours.  Invalid input(s): CO CBG (last 3)   Recent Labs  01/22/16 1629 01/22/16 2143 01/23/16 0622  GLUCAP 158* 157* 109*    Wt Readings from Last 3 Encounters:  01/23/16 113.989 kg (251 lb 4.8 oz)  01/17/16 111.721 kg (246 lb 4.8 oz)  01/04/16 106.595 kg (235 lb)    Physical Exam:  BP 164/87 mmHg  Pulse 79  Temp(Src) 98.1 F (36.7 C) (Oral)  Resp 19  Ht 6\' 1"  (1.854 m)  Wt 113.989 kg (251 lb 4.8 oz)  BMI 33.16 kg/m2  SpO2 96% Constitutional: He appears well-developed. Obese. NAD HENT: Normocephalic and atraumatic.  Eyes: Conjunctivae and EOM are normal.  Cardiovascular: Normal rate and regular rhythm. no murmurs  Respiratory: Effort normal and breath sounds normal. No respiratory distress. No wheezes or rales. GI: Soft. Bowel sounds are normal. He exhibits no distension.  Musculoskeletal: He exhibits Mild tenderness. He exhibits no edema.  Neurological: He is alert and oriented.  Motor: Bilateral upper extremity 4+/5 proximal to distal Left lower extremity hip flexion, knee extension 5/5, ankle dorsi/plantar flexion 5/5 Right lower extremity hip flexion: 4 +/5  Skin:  BKA with staples C/D/I  Psychiatric: He has a normal mood and affect. His behavior is normal  Assessment/Plan: 1. Functional deficits  secondary to right BKA which require 3+ hours per day of interdisciplinary therapy in a comprehensive inpatient rehab setting. Physiatrist is providing close team supervision and 24 hour management of active medical problems listed below. Physiatrist and rehab team continue to assess barriers to discharge/monitor patient progress toward functional and medical goals.  Function:  Bathing Bathing position   Position: Shower  Bathing parts Body parts bathed by patient: Right arm, Left arm, Chest, Abdomen, Front perineal area, Right upper leg, Left upper leg, Buttocks Body parts bathed by helper: Back, Left lower leg  Bathing assist Assist Level: Touching or steadying assistance(Pt > 75%)      Upper Body Dressing/Undressing Upper body dressing   What is the patient wearing?: Pull over shirt/dress     Pull over shirt/dress - Perfomed by patient: Thread/unthread right sleeve, Thread/unthread left sleeve, Put head through opening, Pull shirt over trunk          Upper body assist Assist Level: Set up   Set up : To obtain clothing/put away  Lower Body Dressing/Undressing Lower body dressing   What is the patient wearing?: Pants, Socks       Pants- Performed by helper: Thread/unthread right pants leg, Thread/unthread left pants leg, Pull pants up/down     Socks - Performed by patient: Don/doff left sock (R n/a) Socks - Performed by helper: Don/doff left sock              Lower body assist  Toileting Toileting   Toileting steps completed by patient: Adjust clothing prior to toileting, Performs perineal hygiene Toileting steps completed by helper: Adjust clothing prior to toileting, Performs perineal hygiene, Adjust clothing after toileting    Toileting assist Assist level: Touching or steadying assistance (Pt.75%)   Transfers Chair/bed transfer   Chair/bed transfer method: Lateral scoot Chair/bed transfer assist level: Moderate assist (Pt 50 - 74%/lift or  lower) Chair/bed transfer assistive device: Sliding board, Armrests     Locomotion Ambulation Ambulation activity did not occur: Safety/medical concerns   Max distance: 42ft Assist level: Maximal assist (Pt 25 - 49%)   Wheelchair   Type: Manual Max wheelchair distance: 150 Assist Level: Supervision or verbal cues  Cognition Comprehension Comprehension assist level: Follows complex conversation/direction with extra time/assistive device  Expression Expression assist level: Expresses complex ideas: With extra time/assistive device  Social Interaction Social Interaction assist level: Interacts appropriately 90% of the time - Needs monitoring or encouragement for participation or interaction.  Problem Solving Problem solving assist level: Solves complex 90% of the time/cues < 10% of the time  Memory Memory assist level: Complete Independence: No helper    Medical Problem List and Plan: 1. 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 DVT Prophylaxis/Anticoagulation: Continue Eliquis as prior to admission. 3. Pain Management: Hydrocodone and Robaxin as needed. Monitor with increased mobility 4. Diabetes mellitus with peripheral neuropathy. Hemoglobin A1c 12.6.   Lantus insulin 22 units daily, Increased to 27 units on 5/5.   Check blood sugars before meals and at bedtime. Diabetic teaching  NovoLog 3 units 3 times a day with meals added on 5/4, Increased to 5 units on 5/8  Improving  Will cont to monitor and adjust with increased actvity 5. Neuropsych: This patient is capable of making decisions on his own behalf. 6. Skin/Wound Care: Routine skin checks.   Wound VAC DC'd on 5/7 7. Fluids/Electrolytes/Nutrition: Routine I&O   Mild hyponatremia: 134 on 5/3  labs pending for Monday 8.Leukocytosis. Slowly improving. Maxipime completed 01/17/2016  Appears to have resolved, WBCs 7.4 on 5/7  Cont to  monitor  labs pending for Monday 9. Hypertension/PAF.    Amiodarone 200 mg daily  Coreg 6.25 mg twice a day, increased to 12.5 on 5/5, increased to 25 on 5/8  Will cont to monitor and consider further adjustments if necessary 10. Chronic combined systolic and diastolic congestive heart failure. No signs of fluid overload. Weigh patient daily  Weight 98 KG on 5/5, but 113 on 5/8 11. Acute renal insufficiency/presumably ischemic ATN. Renal ultrasound unremarkable.   Cr 1.82 on 5/3  labs pending for Monday 12. Hyperlipidemia. Lipitor 13. ABLA  Hb 9.7 on 5/7  Cont to monitor  labs pending for Monday  LOS (Days) 6 A FACE TO FACE EVALUATION WAS PERFORMED  Ankit Lorie Phenix 01/23/2016 9:13 AM

## 2016-01-23 NOTE — Progress Notes (Signed)
Physical Therapy Session Note  Patient Details  Name: Thomas Wright MRN: [id number] Date of Birth: 09-20-1945  Today's Date: 01/23/2016 PT Individual Time: [id number] and [id number] PT Individual Time Calculation (min): 60 min and 59 min  Short Term Goals: Week 1:  PT Short Term Goal 1 (Week 1): Patient will performed Sb transfer with min A.  PT Short Term Goal 2 (Week 1): Patient will perform squat pivot transfer with mod A.  PT Short Term Goal 3 (Week 1): Patient will perform sit<>stand from [facility] with RW with mod A.  PT Short Term Goal 4 (Week 1): Patient will perform bed mobility with supervision A.  PT Short Term Goal 5 (Week 1): Patient will propell WC 150 with supervision A and min verbal instruction.   Skilled Therapeutic Interventions/Progress Updates:    Treatment 1: Pt received in bed & agreeable to PT, denying c/o pain. Discussed home set up with patient with him reporting he plans to have ramp installed at back entrance to house, where 6 steps currently are, but pt is concerned regarding finances after d/c. PT notified CSW, Sonia Baller of pt's concerns. PT gave pt Home Measurement sheet handout & pt plans to have someone measure at house. Educated pt on amputee support group & pt eager to attend but unsure about transportation to/from meetings; requested CSW provide pt with transportation options. Pt able to transfer supine>sit with use of bed rails & supervision. Pt has no bed at home, only couch, & will probably need hospital bed at d/c. Sitting at EOB pt able to instruct PT on safe w/c positioning by bed, able to sufficiently lean to L & place sliding board then completed sliding board transfer to R bed>w/c with close supervision. Pt managed w/c leg rest & amputee support pad independently. Pt self propelled w/c 210 ft with supervision & significantly extra time; educated pt to hold on to drive wheels instead of holding on to both wheels. Pt reported hands becoming slippery on wheels & provided pt  with gloves. Pt required extended seated rest break 2/2 fatigue after task. Transferred sit>stand in parallel bars by pulling up with BUE & Min A, able to tolerate standing x 1 minute + 1 minute for endurance training. While standing pt required Min A & verbal cuing for upright posture with pt able to partially correct. Pt demonstrated good hand placement after first standing trial by reaching back for w/c before transferring stand>sit with Min A, but did not reach back after 2nd standing trial. Pt returned to room via w/c total A & left in w/c with all needs within reach.   Treatment 2: Pt received in bed asleep but awoke with verbal stimulation; pt reported 2/10 R distal residual limb pain & requested pain medication. Pt able to transfer supine>sit with use of bed rails & mod I. PT provided total A for w/c set up by bed & pt able to position sliding board with verbal cuing from PT for optimal positioning but pt able to complete sliding board transfer to R with supervision A. Pt able to manage w/c parts with supervision & minimal verbal cuing & self propelled w/c 250 ft with BUE over tile & carpeted floor with supervision. Pt reports gloves are helping him grip drive wheels better, and also reported 6/10 RPE after w/c mobility. Pt reported nausea & PT provided pt with ginger ale & crackers & while pt ate snack PT re-wrapped R residual limb. Vitals assessed: BP = 164/76 mmHg, HR = 75  bpm, SpO2 = 99% on room air. Pt agreeable to trying to stand; utilized parallel bars & pt able to push up with 1UE on armrest & pull up on parallel bar with other UE. Pt completed transfer & then required steady A for standing; pt able to tolerate standing 1 minute 30 seconds then properly reach back with 1UE to slowly control stand>sit with Min A. PT instructed pt on 2nd trial to attempt hopping in parallel bars & pt agreeable but then required Mod A to transfer sit>stand & was unable to achieve full upright standing. NT checked pt's  blood sugar at request of pt & PT & it was 91. Pt noted this is why he felt bad because he "comfort range" is in the 150's. Pt self propelled w/c back to room for cardiovascular endurance training x 200 ft with rest breaks as needed. Pt encouraged to sit up for dinner & pt left in w/c with all needs within reach awaiting meal tray.   Therapy Documentation Precautions:  Precautions Precautions: Fall Restrictions Weight Bearing Restrictions: Yes RLE Weight Bearing: Non weight bearing  Pain: Pain Assessment Pain Assessment: No/denies pain   See Function Navigator for Current Functional Status.   Therapy/Group: Individual Therapy  [name] 01/23/2016, 8:10 AM

## 2016-01-23 NOTE — Progress Notes (Signed)
Occupational Therapy Session Note  Patient Details  Name: Thomas Wright MRN: [id number] Date of Birth: October 24, 1945  Today's Date: 01/23/2016 OT Individual Time: 1300-1415 OT Individual Time Calculation (min): 75 min    Short Term Goals: Week 1:  OT Short Term Goal 1 (Week 1): Pt will transfer to drop arm BSC with min A OT Short Term Goal 2 (Week 1): Pt will stand for 30 seconds with steadying assist in prep for functional task OT Short Term Goal 3 (Week 1): Pt will dress LB with steadying assist OT Short Term Goal 4 (Week 1): Pt will tolerate full bathing/dressing session with 2 rest breaks in order to increase functional activity tolerance  Skilled Therapeutic Interventions/Progress Updates: ADL-retraining with focus on bathtub transfer using bath bench, lateral scoot transfers, discharge planning, and seated upper body bathing and grooming.    Pt educated on use of bath bench s/p discharge, improved efficiency with use of RW if unable to enter bathroom w/wheelchair, and general safety awareness.   After 1 demo, pt completed lateral scoot from w/c to bench with instructional cues and steadying assist for safety with w/c.   Pt required extra time to position w/c and then related that he was unsure if w/c would fit into his bathroom.   OT educated pt on home visit and offered to relay request to complete visit prior to discharge to more accurately determine need or fit of DME.   Pt then self-propelled w/c from ADL back to his room traveling 75% of distance with 2 rest breaks d/t BUE weakness.   Pt escorted back to his room and was setup for upper body bathing and grooming d/t request to shave.   Pt manages BADL well at w/c level; OT discussed goal of simple meal prep to complete at w/c level to include sit<>stand as needed.     Therapy Documentation Precautions:  Precautions Precautions: Fall Restrictions Weight Bearing Restrictions: Yes RLE Weight Bearing: Non weight bearing  Vital  Signs: Therapy Vitals Temp: 99 F (37.2 C) Temp Source: Oral Pulse Rate: 73 Resp: 18 BP: (!) 123/52 mmHg Patient Position (if appropriate): Lying Oxygen Therapy SpO2: 97 % O2 Device: Not Delivered   Pain: Pain Assessment Pain Assessment: 0-10 Pain Score: 5  Pain Type: Surgical pain Pain Location: Leg Pain Orientation: Right Pain Descriptors / Indicators: Aching Pain Frequency: Intermittent Pain Onset: On-going Patients Stated Pain Goal: 2  See Function Navigator for Current Functional Status.   Therapy/Group: Individual Therapy  [place] 01/23/2016, 2:32 PM

## 2016-01-24 ENCOUNTER — Encounter: Payer: Self-pay | Admitting: *Deleted

## 2016-01-24 ENCOUNTER — Inpatient Hospital Stay (HOSPITAL_COMMUNITY): Payer: Commercial Managed Care - HMO

## 2016-01-24 ENCOUNTER — Inpatient Hospital Stay (HOSPITAL_COMMUNITY): Payer: Commercial Managed Care - HMO | Admitting: Physical Therapy

## 2016-01-24 ENCOUNTER — Other Ambulatory Visit: Payer: Self-pay | Admitting: *Deleted

## 2016-01-24 LAB — GLUCOSE, CAPILLARY
GLUCOSE-CAPILLARY: 141 mg/dL — AB (ref 65–99)
GLUCOSE-CAPILLARY: 265 mg/dL — AB (ref 65–99)
Glucose-Capillary: 165 mg/dL — ABNORMAL HIGH (ref 65–99)
Glucose-Capillary: 110 mg/dL — ABNORMAL HIGH (ref 65–99)
Glucose-Capillary: 219 mg/dL — ABNORMAL HIGH (ref 65–99)

## 2016-01-24 NOTE — Patient Outreach (Signed)
[facility]) Care Management  01/24/2016  Thomas Wright [birth year] [id number]   Phone call from social worker [name], who states that patient has decided to remain at his current residence and will inform his landlord that he can begin plans to build the wheelchair ramp.  Per [name], she will assist patient in possibly getting a hospital bed and  the [name] application completed to meet his transportation needs.  This Education officer, museum will order well-dine meals  for patient post discharge to assist with meals.   Plan:  This social worker to continue to follow patient and collaborate with social worker in regards to patient's discharge plan.    [name] [facility] Care Management [phone]

## 2016-01-24 NOTE — Progress Notes (Signed)
Occupational Therapy Session Note  Patient Details  Name: Thomas Wright MRN: [id number] Date of Birth: 1946/07/21  Today's Date: 01/24/2016 OT Individual Time: 0930-1030 OT Individual Time Calculation (min): 60 min    Short Term Goals: Week 1:  OT Short Term Goal 1 (Week 1): Pt will transfer to drop arm BSC with min A OT Short Term Goal 2 (Week 1): Pt will stand for 30 seconds with steadying assist in prep for functional task OT Short Term Goal 3 (Week 1): Pt will dress LB with steadying assist OT Short Term Goal 4 (Week 1): Pt will tolerate full bathing/dressing session with 2 rest breaks in order to increase functional activity tolerance  Skilled Therapeutic Interventions/Progress Updates: ADL-retraining at shower level with focus on improved transfers, sit<>stand, dynamic standing balance, and simple meal prep.   Pt completes bed mobility with rails down and transfers to w/c from EOB with distant supervision, vc for safety (lock brakes and lift arm rest during lateral scoot).   Pt performs similar transfer, w/c<>tub bench with min vc again to lift arm rest.   Pt dresses at sink and attempt sit<>stand supported at sink but is unable to maintain supported standing balance beyond 5 seconds d/t fatigue from prior therapy session.    Pt dons pants  75% using lateral leans while in w/c.   Pt then escorted to ADL apartment and educated on kitchen safety while completing simple meal prep at w/c level.   Pt prepares 1 fried egg with good awareness and verbalized understanding of OT recommendations relating to meal prep at w/c level.   Pt escorted back to his room at end of session and remained in w/c with all needs within reach.     Therapy Documentation Precautions:  Precautions Precautions: Fall Restrictions Weight Bearing Restrictions: Yes RLE Weight Bearing: Non weight bearing  Pain: Pain Assessment Pain Assessment: No/denies pain  See Function Navigator for Current Functional  Status.   Therapy/Group: Individual Therapy   Second session: Time: 1330-1415 Time Calculation (min):  45 min  Pain Assessment: No/denies pain  Skilled Therapeutic Interventions: ADL-retraining with focus on discharge planning, bed mobility, transfers, w/c propulsion and UE strengthening using arm ergometry.    Pt received supine in bed and in conversation with social worker relating to discharge planning.    With min vc, pt was able to articulate a plan for support s/p discharge from friends consistent with OT prior recommendations to delegate homemaking, use hospital bed setup in living room, and complete bathing at EOB.  Pt then propelled w/c 1/2 distance to rehab gym and completed 10 min of BUE ergometry using Sci-Fit, level 2, randoom routine.    Pt reported mild exertion at end of session, HR/BP assessed @ 77 bpm, 153/124 and rechecked 5 minutes later as 72 bpm, 135/57.   Discussed plan for home visit contingent on installation of ramp (in progress) at end of session.       See FIM for current functional status  Therapy/Group: Individual Therapy  Chianna Spirito 01/24/2016, 10:52 AM

## 2016-01-24 NOTE — Progress Notes (Signed)
[place] PHYSICAL MEDICINE & REHABILITATION     PROGRESS NOTE  Subjective/Complaints:  Patient sitting up in bed eating breakfast. He states he is happy now that he has breakfast.  ROS: Denies phantom limb pain, CP, SOB, N/V/D.   Objective: Vital Signs: Blood pressure 157/89, pulse 87, temperature 98.7 F (37.1 C), temperature source Oral, resp. rate 17, height 6\' 1"  (1.854 m), weight 90.901 kg (200 lb 6.4 oz), SpO2 98 %. No results found.  Recent Labs  01/22/16 0630 01/23/16 1210  WBC 7.4 7.6  HGB 9.7* 9.4*  HCT 31.1* 29.6*  PLT 362 367    Recent Labs  01/23/16 1210  NA 135  K 4.9  CL 99*  GLUCOSE 166*  BUN 22*  CREATININE 1.48*  CALCIUM 8.0*   CBG (last 3)   Recent Labs  01/23/16 2129 01/24/16 0057 01/24/16 0646  GLUCAP 236* 265* 165*    Wt Readings from Last 3 Encounters:  01/23/16 90.901 kg (200 lb 6.4 oz)  01/17/16 111.721 kg (246 lb 4.8 oz)  01/04/16 106.595 kg (235 lb)    Physical Exam:  BP 157/89 mmHg  Pulse 87  Temp(Src) 98.7 F (37.1 C) (Oral)  Resp 17  Ht 6\' 1"  (1.854 m)  Wt 90.901 kg (200 lb 6.4 oz)  BMI 26.45 kg/m2  SpO2 98% Constitutional: He appears well-developed. Obese. NAD HENT: Normocephalic and atraumatic.  Eyes: Conjunctivae and EOM are normal.  Cardiovascular: Normal rate and regular rhythm. no murmurs  Respiratory: Effort normal and breath sounds normal. No respiratory distress. No wheezes or rales. GI: Soft. Bowel sounds are normal. He exhibits no distension.  Musculoskeletal: He exhibits Mild tenderness. He exhibits no edema.  Neurological: He is alert and oriented.  Motor: Bilateral upper extremity 5/5 proximal to distal Left lower extremity hip flexion, knee extension 5/5, ankle dorsi/plantar flexion 5/5 Right lower extremity hip flexion: 4+/5  Skin:  BKA with staples , mild serous drainage from lateral stump  Psychiatric: He has a normal mood and affect. His behavior is normal  Assessment/Plan: 1. Functional  deficits secondary to right BKA which require 3+ hours per day of interdisciplinary therapy in a comprehensive inpatient rehab setting. Physiatrist is providing close team supervision and 24 hour management of active medical problems listed below. Physiatrist and rehab team continue to assess barriers to discharge/monitor patient progress toward functional and medical goals.  Function:  Bathing Bathing position   Position: Wheelchair/chair at sink  Bathing parts Body parts bathed by patient: Right arm, Left arm, Chest, Abdomen, Front perineal area, Right upper leg, Left upper leg, Buttocks Body parts bathed by helper: Right arm, Left arm, Chest  Bathing assist Assist Level: Set up   Set up : To obtain items  Upper Body Dressing/Undressing Upper body dressing   What is the patient wearing?: Pull over shirt/dress     Pull over shirt/dress - Perfomed by patient: Thread/unthread right sleeve, Thread/unthread left sleeve, Put head through opening, Pull shirt over trunk          Upper body assist Assist Level: Set up   Set up : To obtain clothing/put away  Lower Body Dressing/Undressing Lower body dressing   What is the patient wearing?: Pants, Socks       Pants- Performed by helper: Thread/unthread right pants leg, Thread/unthread left pants leg, Pull pants up/down     Socks - Performed by patient: Don/doff left sock (R n/a) Socks - Performed by helper: Don/doff left sock  Lower body assist        Toileting Toileting   Toileting steps completed by patient: Adjust clothing prior to toileting, Performs perineal hygiene Toileting steps completed by helper: Adjust clothing prior to toileting, Performs perineal hygiene, Adjust clothing after toileting    Toileting assist Assist level: Touching or steadying assistance (Pt.75%)   Transfers Chair/bed transfer   Chair/bed transfer method: Lateral scoot Chair/bed transfer assist level: Supervision or verbal  cues Chair/bed transfer assistive device: Sliding board     Locomotion Ambulation Ambulation activity did not occur: Safety/medical concerns   Max distance: 17ft Assist level: Maximal assist (Pt 25 - 49%)   Wheelchair   Type: Manual Max wheelchair distance: 200 ft Assist Level: No help, No cues, assistive device, takes more than reasonable amount of time  Cognition Comprehension Comprehension assist level: Follows complex conversation/direction with extra time/assistive device  Expression Expression assist level: Expresses complex ideas: With extra time/assistive device  Social Interaction Social Interaction assist level: Interacts appropriately with others with medication or extra time (anti-anxiety, antidepressant).  Problem Solving Problem solving assist level: Solves complex 90% of the time/cues < 10% of the time  Memory Memory assist level: More than reasonable amount of time    Medical Problem List and Plan: 1. 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 DVT Prophylaxis/Anticoagulation: Continue Eliquis as prior to admission. 3. Pain Management: Hydrocodone and Robaxin as needed. Monitor with increased mobility 4. Diabetes mellitus with peripheral neuropathy. Hemoglobin A1c 12.6.   Lantus insulin 22 units daily, Increased to 27 units on 5/5.   Check blood sugars before meals and at bedtime. Diabetic teaching  NovoLog 3 units 3 times a day with meals added on 5/4, Increased to 5 units on 5/8  Will cont to monitor and adjust with increased activity  CBGs atypically elevated yesterday, will continue to monitor 5. Neuropsych: This patient is capable of making decisions on his own behalf. 6. Skin/Wound Care: Routine skin checks.   Wound VAC DC'd on 5/7 7. Fluids/Electrolytes/Nutrition: Routine I&O   Mild hyponatremia: 135 on 5/8 8.Leukocytosis. Slowly improving. Maxipime completed  01/17/2016  Appears to have resolved, WBCs 7.6 on 5/8  Cont to monitor 9. Hypertension/PAF.    Amiodarone 200 mg daily  Coreg 6.25 mg twice a day, increased to 12.5 on 5/5, increased to 25 on 5/8  Will cont to monitor and consider further adjustments if necessary 10. Chronic combined systolic and diastolic congestive heart failure. No signs of fluid overload. Weigh patient daily  Weight 98 KG on 5/5, 113 on 5/8, 90 on 5/8 11. Acute renal insufficiency/presumably ischemic ATN. Renal ultrasound unremarkable.   Cr 1.48 on 5/8 (improving) 12. Hyperlipidemia. Lipitor 13. ABLA  Hb 9.4 on 5/8  Cont to monitor  LOS (Days) 7 A FACE TO FACE EVALUATION WAS PERFORMED  Ankit Lorie Phenix 01/24/2016 8:24 AM

## 2016-01-24 NOTE — Progress Notes (Signed)
Physical Therapy Session Note  Patient Details  Name: Thomas Wright MRN: [id number] Date of Birth: 08/15/1946  Today's Date: 01/24/2016 PT Individual Time: [id number] PT Individual Time Calculation (min): 76 min and 31 min  Short Term Goals: Week 1:  PT Short Term Goal 1 (Week 1): Patient will performed Sb transfer with min A.  PT Short Term Goal 2 (Week 1): Patient will perform squat pivot transfer with mod A.  PT Short Term Goal 3 (Week 1): Patient will perform sit<>stand from [facility] with RW with mod A.  PT Short Term Goal 4 (Week 1): Patient will perform bed mobility with supervision A.  PT Short Term Goal 5 (Week 1): Patient will propell WC 150 with supervision A and min verbal instruction.   Skilled Therapeutic Interventions/Progress Updates:    Treatment 1: Pt received in w/c & agreeable to PT,denying c/o pain. Pt self propelled w/c from room>lobby & gift shop in hospital with [name] & supervision. Pt required multiple rest breaks 2/2 overall & BUE fatigue. PT instructed pt to back into elevator instead of entering forwards & pt able to independently demonstrate this later during session without cuing. Pt able to negotiate narrow spaces in gift shop but required frequent cuing to push one wheel & pull back on the other to complete narrow turn. Pt returned to unit & in parallel bars was able to transfer sit<>stand with Min A by pulling up on bars. While standing pt able to perform 3 hops but unable to hop backwards to return to chair. PT instructed pt to push upwards with BUE to allow LLE to advance with pt demonstrating fair ability to do so. Pt commented multiple times throughout session that BUE's felt liked rubber. PT Provided encouragement & motivation to pt throughout session. Pt performed static standing in parallel bars for 2 minutes 10 secondswith BUE support & Min Guard assist. Pt then self propelled w/c to room, reporting significant fatigue; pt left in w/c with all needs within  reach, awaiting lunch.   Treatment 2: Pt received in bed & agreeable to PT, denying c/o pain. Pt reported need to urinate & independently utilized urinal in bed, (+) void. PT provided w/c set up & pt able to transfer supine>sit with Mod I & hospital features. Pt able to independently place sliding board then transfer bed>w/c with supervision & transported to w/c total A for time management. Pt transferred w/c>nu-step with cuing & assistance for w/c set up & placement, but required supervision for sliding board transfer. Performed activity at Level 1 x 10 minutes focusing on cardiovascular endurance & activity tolerance with pt reporting maximum of 14 on Borg RPE scale during activity. During activity pt's RLE was supported on stool. Pt reports fatigue after task but after resting was able to transfer nu-step>w/c in same manner as noted above. Pt transported back to room via total A in w/c & left in w/c with all needs within reach.   Therapy Documentation Precautions:  Precautions Precautions: Fall Restrictions Weight Bearing Restrictions: Yes RLE Weight Bearing: Non weight bearing  Pain: Pain Assessment Pain Assessment: No/denies pain   See Function Navigator for Current Functional Status.   Therapy/Group: Individual Therapy  Thomas Wright 01/24/2016, 8:15 AM

## 2016-01-24 NOTE — Progress Notes (Signed)
Social Work Patient ID: Thomas Wright, male   DOB: 10-30-1945, 70 y.o.   MRN: [id number]   CSW spoke with pt yesterday and again today about d/c planning.  Pt has decided to allow landlord to extend his lease by one year in exchange for landlord building a ramp for pt.  Pt is concerned about finances, but states that it is expensive to move, too.  He knows he will break his lease if he moves now anyway, so he seemed resigned to just staying until April 2019 at this point.  CSW pressed pt on his sister's offer to come to her home for a bit in [place], but he remains adamant that he doesn't want to move and change his doctors again.  Pt feels he will be able to move about the main area of the mobile home easily since it's open and has vinyl flooring, however he is unsure about doorways to bedroom and bathroom.  Pt open to a [name] application for handicap accessible transportation and CSW to assist with this.  Pt also has some medical transportation benefit through his [employer].  CSW will also talk with [facility] CSW to find any other resources she may know of, as she will continue to follow pt after d/c.

## 2016-01-24 NOTE — Consult Note (Signed)
  INITIAL DIAGNOSTIC EVALUATION - [facility]   MEDICAL NECESSITY:  Thomas Wright was seen on the [facility] for an initial diagnostic evaluation owing to the patient's diagnosis of BKA.   Records indicate that Thomas Wright is a "70 y.o. right handed male with history of hypertension, PAF maintained on Eliquis, diabetes mellitus peripheral neuropathy, aortic regurgitation status post valve replacement, chronic combined systolic and diastolic congestive heart failure. Patient lives alone independently with a cane prior to admission. No local family. Mobile home with 9 steps to entry. Presented 01/09/2016 with low-grade fever, tachycardia in the 110s, leukocytosis 28,300, lactic acid 3.58 and gangrenous right foot. X-rays of right foot could not exclude very early changes of osteomyelitis. Patient with elevated creatinine 2.34 from baseline 0.9-1.1. Renal ultrasound no acute abnormalities. Renal service consulted. Felt AKI in setting of early sepsis presumed ischemic ATN. Conservative care close monitoring of renal function with gentle IV fluids. Persistent leukocytosis. 25,900. MRSA PCR screen negative. Initially placed on vancomycin and cefepime and later simplified to cefepime and completed 01/17/2016. No change with conservative care of gangrenous right foot. 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 Sharol Wright.Patient was admitted for a comprehensive rehabilitation program."   During today's visit, Thomas Wright denied suffering any cognitive issues except for when he takes his pain medications. Emotionally, he has no history of treatment for mental illness and he has never suffered any prolonged periods of depression or anxiety. Post-surgery, he was experiencing some generalized anger about having to undergo the amputation but this has subsided. He has also had a few "why me" type of moments. However, in general he has been maintaining a  more positive attitude, though there are some continued social stressors that cause anxiety (e.g., financial matters, living situation, being able to be independent at home). He also described feeling less confident about his physical abilities at present. Suicidal/homicidal ideation, plan or intent was denied. No manic or hypomanic episodes were reported. The patient denied ever experiencing any auditory/visual hallucinations. No major behavioral or personality changes were endorsed.   Thomas Wright wishes to return to work at some point but will be applying for disability benefits in the meantime. He requested an in-home safety inspection to make sure he has the resources necessary to transition home. He has no family close by but there may be some neighbors and friends that can assist. In general he is not concerned about staying alone. With regard to therapy, he feels that he is making progress. No major barriers identified. He has been very satisfied with the rehab staff.   PROCEDURES: [1 unit 90791] Diagnostic clinical interview  Review of available records   IMPRESSION: Overall, Thomas Wright denied suffering from any major cognitive deficits and no overt cognitive issues were observed. From an emotional standpoint, he described experiencing what I would consider a normal adjustment reaction to an unfortunate situation with additional anxiety regarding multiple psychosocial factors. We spent time normalizing his feeling and processing the situation. I recommended participation in amputee support group to assist with the transition home. I see no need for cognitive assessment or formal psychological follow-up. Neuropsychology will only see the patient as needed, though I may informally check-in with him throughout this admission.      Rutha Bouchard, Psy.D.  Clinical Neuropsychologist

## 2016-01-25 ENCOUNTER — Ambulatory Visit (INDEPENDENT_AMBULATORY_CARE_PROVIDER_SITE_OTHER): Payer: Commercial Managed Care - HMO | Admitting: Internal Medicine

## 2016-01-25 ENCOUNTER — Inpatient Hospital Stay (HOSPITAL_COMMUNITY): Payer: Commercial Managed Care - HMO | Admitting: Physical Therapy

## 2016-01-25 ENCOUNTER — Inpatient Hospital Stay (HOSPITAL_COMMUNITY): Payer: Commercial Managed Care - HMO | Admitting: Occupational Therapy

## 2016-01-25 DIAGNOSIS — I480 Paroxysmal atrial fibrillation: Secondary | ICD-10-CM

## 2016-01-25 DIAGNOSIS — Z006 Encounter for examination for normal comparison and control in clinical research program: Secondary | ICD-10-CM

## 2016-01-25 LAB — GLUCOSE, CAPILLARY
GLUCOSE-CAPILLARY: 135 mg/dL — AB (ref 65–99)
GLUCOSE-CAPILLARY: 113 mg/dL — AB (ref 65–99)
Glucose-Capillary: 125 mg/dL — ABNORMAL HIGH (ref 65–99)
Glucose-Capillary: 163 mg/dL — ABNORMAL HIGH (ref 65–99)
Glucose-Capillary: 174 mg/dL — ABNORMAL HIGH (ref 65–99)

## 2016-01-25 MED ORDER — INSULIN GLARGINE 100 UNIT/ML ~~LOC~~ SOLN
30.0000 [IU] | Freq: Every day | SUBCUTANEOUS | Status: DC
  Administered 2016-01-25: 30 [IU] via SUBCUTANEOUS
  Filled 2016-01-25 (×2): qty 0.3

## 2016-01-25 MED ORDER — INSULIN ASPART 100 UNIT/ML ~~LOC~~ SOLN
6.0000 [IU] | Freq: Three times a day (TID) | SUBCUTANEOUS | Status: DC
  Administered 2016-01-25 – 2016-01-28 (×5): 6 [IU] via SUBCUTANEOUS

## 2016-01-25 NOTE — Progress Notes (Signed)
Apixaban Validation Study (ClinicalTrials.gov Identifier: [id number]) RESEARCH SUBJECT. Purpose: Obtain fresh samples that will be used to assess the performances of STA-Apixaban Calibrator and STA-Apixaban Control in combination with the STA-Liquid Anti-Xa to determine the quantity of apixaban in plasma samples by measurement of its direct anti-Xa activity. Apixaban Validation Study is sponsored by [employer].The fresh samples will collected by completing a one time blood draw on approved patients.   Inclusion Criteria: weight </= 60kg, >/= 75 years, Hct <39% for male, < 36% for male, renal impairment, co-medication with ASA/NSAIDs, and/or co-medication with anti-platelet agents.  Protocol Title:  Apixaban Validation Study Informed Consent   Subject Name: STEPFON MEYEROWITZ  This patient, Thomas Wright, has been consented to the above clinical trial according to FDA regulations, GCP guidelines and [employer]'s SOPs. The informed consent form and study design have been explained to this patient by this study coordinator. The patient demonstrated comprehension of this clinical trial and study requirements/expectations. No study procedures have been initiated before consenting of this patient. The patient was given sufficient time for reading the consent form. All risks, benefits and options have been thoroughly discussed and all questions were answered per the patient's satisfaction. This patient was not coerced in any way to participate in this clinical trial. This patient has voluntarily signed consent version one at 12:11pm on 01/25/2016. A copy of the signed consent form was given to the patient and a copy was placed in the subject's medical record.  Roane Bing, [place], [place] Assistant Office [phone]    Sub-investigator note  - . On the history reviewed he has a history of bioprosthetic valve surgery in 2008 according to cardiology notes. This was done in [place].  Subsequently he was not on anticoagulation. However since Nov  2015 he had recurrent atrial fibrillation and at this point in time was placed on Eliquis/apixaban for the specific indication of non-valvular atrial fibrillation based on his H&P note from 07/21/14. . I called his cardiologist Dr [name] 5:36 PM 01/25/2016 and confirmed it again that indication was non-valvular A Fib   Dr. Brand Males, M.D., [facility].C.P Pulmonary and Critical Care Medicine Staff Physician [facility] Pager: [phone], If no answer or between  15:00h - 7:00h: call [phone]  01/25/2016 5:42 PM

## 2016-01-25 NOTE — Progress Notes (Signed)
[facility] PHYSICAL MEDICINE & REHABILITATION     PROGRESS NOTE  Subjective/Complaints:  Patient sitting up in bed. He seems to be pleased whenever he gets breakfast. He believes he is getting stronger.  ROS: Denies phantom limb pain, CP, SOB, N/V/D.   Objective: Vital Signs: Blood pressure 131/70, pulse 71, temperature 98.2 F (36.8 C), temperature source Oral, resp. rate 18, height 6\' 1"  (1.854 m), weight 91.6 kg (201 lb 15.1 oz), SpO2 96 %. No results found.  Recent Labs  01/23/16 1210  WBC 7.6  HGB 9.4*  HCT 29.6*  PLT 367    Recent Labs  01/23/16 1210  NA 135  K 4.9  CL 99*  GLUCOSE 166*  BUN 22*  CREATININE 1.48*  CALCIUM 8.0*   CBG (last 3)   Recent Labs  01/24/16 2112 01/25/16 0417 01/25/16 0554  GLUCAP 141* 125* 163*    Wt Readings from Last 3 Encounters:  01/25/16 91.6 kg (201 lb 15.1 oz)  01/17/16 111.721 kg (246 lb 4.8 oz)  01/04/16 106.595 kg (235 lb)    Physical Exam:  BP 131/70 mmHg  Pulse 71  Temp(Src) 98.2 F (36.8 C) (Oral)  Resp 18  Ht 6\' 1"  (1.854 m)  Wt 91.6 kg (201 lb 15.1 oz)  BMI 26.65 kg/m2  SpO2 96% Constitutional: He appears well-developed. Obese. NAD HENT: Normocephalic and atraumatic.  Eyes: Conjunctivae and EOM are normal.  Cardiovascular: Normal rate and regular rhythm. no murmurs  Respiratory: Effort normal and breath sounds normal. No respiratory distress. No wheezes or rales. GI: Soft. Bowel sounds are normal. He exhibits no distension.  Musculoskeletal: He exhibits Mild tenderness. He exhibits no edema.  Neurological: He is alert and oriented.  Motor: Bilateral upper extremity 5/5 proximal to distal Left lower extremity hip flexion, knee extension 5/5, ankle dorsi/plantar flexion 5/5 Right lower extremity hip flexion, Knee extension: 5/5  Skin:  BKA with staples , mild serous drainage at edges  Psychiatric: He has a normal mood and affect. His behavior is normal  Assessment/Plan: 1. Functional deficits  secondary to right BKA which require 3+ hours per day of interdisciplinary therapy in a comprehensive inpatient rehab setting. Physiatrist is providing close team supervision and 24 hour management of active medical problems listed below. Physiatrist and rehab team continue to assess barriers to discharge/monitor patient progress toward functional and medical goals.  Function:  Bathing Bathing position   Position: Shower  Bathing parts Body parts bathed by patient: Right arm, Left arm, Chest, Abdomen, Front perineal area, Right upper leg, Left upper leg, Buttocks Body parts bathed by helper: Right arm, Left arm, Chest, Abdomen, Front perineal area, Buttocks, Right upper leg, Left upper leg, Left lower leg  Bathing assist Assist Level: Supervision or verbal cues   Set up : To obtain items  Upper Body Dressing/Undressing Upper body dressing   What is the patient wearing?: Pull over shirt/dress     Pull over shirt/dress - Perfomed by patient: Thread/unthread right sleeve, Thread/unthread left sleeve, Put head through opening, Pull shirt over trunk          Upper body assist Assist Level: Set up   Set up : To obtain clothing/put away  Lower Body Dressing/Undressing Lower body dressing   What is the patient wearing?: Pants, Non-skid slipper socks     Pants- Performed by patient: Thread/unthread right pants leg, Thread/unthread left pants leg Pants- Performed by helper: Pull pants up/down Non-skid slipper socks- Performed by patient: Don/doff left sock   Socks -  Performed by patient: Don/doff left sock (R n/a) Socks - Performed by helper: Don/doff left sock              Lower body assist        Toileting Toileting   Toileting steps completed by patient: Adjust clothing prior to toileting, Performs perineal hygiene Toileting steps completed by helper: Adjust clothing prior to toileting, Performs perineal hygiene, Adjust clothing after toileting    Toileting assist Assist  level: Touching or steadying assistance (Pt.75%)   Transfers Chair/bed transfer   Chair/bed transfer method: Lateral scoot Chair/bed transfer assist level: Set up only Chair/bed transfer assistive device: Sliding board     Locomotion Ambulation Ambulation activity did not occur: Safety/medical concerns   Max distance: 2 ft Assist level: Touching or steadying assistance (Pt > 75%)   Wheelchair   Type: Manual Max wheelchair distance: >200 ft Assist Level: Supervision or verbal cues  Cognition Comprehension Comprehension assist level: Follows complex conversation/direction with extra time/assistive device  Expression Expression assist level: Expresses complex ideas: With extra time/assistive device  Social Interaction Social Interaction assist level: Interacts appropriately with others with medication or extra time (anti-anxiety, antidepressant).  Problem Solving Problem solving assist level: Solves basic 90% of the time/requires cueing < 10% of the time  Memory Memory assist level: More than reasonable amount of time    Medical Problem List and Plan: 1. 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 DVT Prophylaxis/Anticoagulation: Continue Eliquis as prior to admission. 3. Pain Management: Hydrocodone and Robaxin as needed. Monitor with increased mobility 4. Diabetes mellitus with peripheral neuropathy. Hemoglobin A1c 12.6.   Lantus insulin 22 units daily, Increased to 27 units on 5/5, Increased to 30 units on 5/10.   Check blood sugars before meals and at bedtime. Diabetic teaching  NovoLog 3 units 3 times a day with meals added on 5/4, Increased to 5 units on 5/8, Increased to 60 minutes on 5/10  CBGs appear to be Trending up  Will cont to monitor and adjust with increased activity 5. Neuropsych: This patient is capable of making decisions on his own behalf. 6. Skin/Wound Care: Routine skin  checks.   Wound VAC DC'd on 5/7 7. Fluids/Electrolytes/Nutrition: Routine I&O   Mild hyponatremia: 135 on 5/8  Labs ordered for tomorrow 8.Leukocytosis. Slowly improving. Maxipime completed 01/17/2016  Appears to have resolved, WBCs 7.6 on 5/8  Cont to monitor 9. Hypertension/PAF.    Amiodarone 200 mg daily  Coreg 6.25 mg twice a day, increased to 12.5 on 5/5, increased to 25 on 5/8  Will cont to monitor and consider further adjustments if necessary 10. Chronic combined systolic and diastolic congestive heart failure. No signs of fluid overload. Weigh patient daily  Weight 98 KG on 5/5, 113 on 5/8, 90 on 5/8, 91 on 5/10-this appears to be more reliable 11. Acute renal insufficiency/presumably ischemic ATN. Renal ultrasound unremarkable.   Cr 1.48 on 5/8 (improving)  Labs ordered for tomorrow 12. Hyperlipidemia. Lipitor 13. ABLA  Hb 9.4 on 5/8  Cont to monitor  LOS (Days) 8 A FACE TO FACE EVALUATION WAS PERFORMED  Ankit Lorie Phenix 01/25/2016 8:39 AM

## 2016-01-25 NOTE — Progress Notes (Signed)
Physical Therapy Session Note  Patient Details  Name: Thomas Wright MRN: [id number] Date of Birth: 1945-11-20  Today's Date: 01/25/2016 PT Individual Time: 1045-1200 PT Individual Time Calculation (min): 75 min   Short Term Goals: Week 1:  PT Short Term Goal 1 (Week 1): Patient will performed Sb transfer with min A.  PT Short Term Goal 2 (Week 1): Patient will perform squat pivot transfer with mod A.  PT Short Term Goal 3 (Week 1): Patient will perform sit<>stand from [facility] with RW with mod A.  PT Short Term Goal 4 (Week 1): Patient will perform bed mobility with supervision A.  PT Short Term Goal 5 (Week 1): Patient will propell WC 150 with supervision A and min verbal instruction.   Skilled Therapeutic Interventions/Progress Updates:    Patient received sitting in [facility] and agreeable to PT. Patient performed WC mobility on level surface for 273ft, 173ft, 31ftx2, and 75 with supervision A from Pt with min cues to avoid moving obstacles in crowed hall of hospital entrance. Patient performed WC mobility for 136ft x2 on unlevel cement sidewalk as well as ascent of handicap access ramp with supervision A from PT. Heavy multimodal cueing required from PT for improved forward lean at trunk to improve mechanical advantage as well as to maintain control of WC with BUE down descent of access ramp.   Patient instructed in SB transfer x 4 throughout treatment with supervision A and slight set up. Patient able to position SB correctly with min cueing x 3 and only slight cueing needed for UE placement.  Sit stand from [facility] with mod A and RW x 4, mod cues for proper sequencing of movement and UE positioning to maintain COM over BOS of sound LLE. Standing trials x 4 up to 2.5 minutes for 2 trials and 2 minutes for remaining trials.   Patient attempted gait training in parallel bars and was able to take 3 steps for a total of 3 ft with mod A from Pt and max cues for LE sequencing and push throught BUE to allow foot  clearance.   Patient returned to room and left sitting in [facility] with call bell within reach.   Therapy Documentation Precautions:  Precautions Precautions: Fall Restrictions Weight Bearing Restrictions: Yes RLE Weight Bearing: Non weight bearing   Pain: Pain Assessment Pain Assessment: 0-10 Pain Score: 5  Pain Location: Leg Pain Orientation: Right Pain Intervention(s): Repositioned;RN made aware  See Function Navigator for Current Functional Status.   Therapy/Group: Individual Therapy  Lorie Phenix 01/25/2016, 6:16 PM

## 2016-01-25 NOTE — Progress Notes (Signed)
Physical Therapy Session Note  Patient Details  Name: Thomas Wright MRN: [id number] Date of Birth: 1945/12/12  Today's Date: 01/25/2016 PT Individual Time: [id number] PT Individual Time Calculation (min): 79 min   Short Term Goals: Week 1:  PT Short Term Goal 1 (Week 1): Patient will performed Sb transfer with min A.  PT Short Term Goal 2 (Week 1): Patient will perform squat pivot transfer with mod A.  PT Short Term Goal 3 (Week 1): Patient will perform sit<>stand from [facility] with RW with mod A.  PT Short Term Goal 4 (Week 1): Patient will perform bed mobility with supervision A.  PT Short Term Goal 5 (Week 1): Patient will propell WC 150 with supervision A and min verbal instruction.   Skilled Therapeutic Interventions/Progress Updates:    Pt received in bed & agreeable to PT, noting 5/10 R residual limb pain & PT notified RN. Pt transferred supine>sitting EOB with Mod I & use of bed features. PT measured pt for w/c & pt will need 20x20 with R amputee support pad; PT notified CSW of w/c measurements & that pt would need long sliding board upon d/c. While sitting EOB pt able to don L shoe independently & RN administered pain medication. Pt able to lock w/c brakes with w/c placed by bed & complete sliding board transfer with supervision. Throughout session pt required verbal cuing & supervision for w/c parts management. Pt self propelled w/c to ortho gym with BUE & rest breaks 2/2 fatigue. PT discussed with pt unsafe d/c plan as therapist believes pt would benefit from pt having someone stay with him initially upon d/c. Discussed with pt potential to stay at family/friends or to have them stay with him at his house. Pt reports he plans to talk with friends to see what can be done, but at this point is planning to return home alone & have someone build ramp at house. PT stressed importance of pt having ramp completed prior to d/c. Set up simulated car at small sedan seat height & provided verbal cuing for pt  to set w/c up for transfer. Pt able to complete w/c>car transfer to elevated seat height with sliding board & supervision. Pt with anxiety with task, requiring cuing to stay calm. Pt then able to complete transfer car>w/c with sliding board & supervision, with verbal cuing to turn out of car to have LLE on floor for stability. Once in chair pt required assistance to remove sliding board as it was sticking to pt's buttocks. Pt able to manage arm rests & leg rests with supervision, forgetting to re-attach armrest until PT cued him to do so. Throughout task pt required multiple rest breaks 2/2 overall fatigue caused by poor endurance. Attempted to have pt performed seated tricep pushups but unable to sufficiently produce enough force to lift buttocks off of seat. Set up pt weight machine & pt performed seated tricep extensor exercises focusing on concentric & eccentric control. Pt progressed from 5 to 10 pounds, performing repetitions until muscle fatigue. Pt self propelled w/c ~200 ft back towards room with PT assisting throughout. At end of session pt left in w/c with all needs within reach.   Therapy Documentation Precautions:  Precautions Precautions: Fall Restrictions Weight Bearing Restrictions: Yes RLE Weight Bearing: Non weight bearing  Pain: Pain Assessment Pain Assessment: 0-10 Pain Score: 5  Pain Location: Leg Pain Orientation: Right Pain Intervention(s): Repositioned;RN made aware   See Function Navigator for Current Functional Status.   Therapy/Group: Individual Therapy  [name] 01/25/2016, 4:36 PM

## 2016-01-25 NOTE — Progress Notes (Signed)
Occupational Therapy Session Note  Patient Details  Name: Thomas Wright MRN: [id number] Date of Birth: [birth year]  Today's Date: 01/25/2016 OT Individual Time: 0900-1000 OT Individual Time Calculation (min): 60 min    Short Term Goals: Week 1:  OT Short Term Goal 1 (Week 1): Pt will transfer to drop arm BSC with min A OT Short Term Goal 2 (Week 1): Pt will stand for 30 seconds with steadying assist in prep for functional task OT Short Term Goal 3 (Week 1): Pt will dress LB with steadying assist OT Short Term Goal 4 (Week 1): Pt will tolerate full bathing/dressing session with 2 rest breaks in order to increase functional activity tolerance  Skilled Therapeutic Interventions/Progress Updates:    1:1 self care retraining at EOB. Pt able to perform bathing sitting on EOB with lateral leans to wash peri area and bottom. PT donned shoe today for more support and provided education on foot protection for his left foot.  Focus on sit to stand for clothing management (up and down).  Bed height set to the same as w/c height. Pt required more Assistance as was not successful with sit to stands with one Ue on the bed and the other one on the Rw.  Pt was able to accomplish sit to stand with pushing up with both hands on the bed with min A! Pt able to perform 2 sit to stand and with min A able to maintain balance with only one UE to adjust clothing. Pt performed squat pivot transfer with min A with little clearance. Transitioned down to the gym for continued practice with squat pivot transfers and sit to stands. Pt unable to wiggle or move foot in standing but able to tolerate standing and able to demonstrate a controlled decent with min instructional cues.   Therapy Documentation Precautions:  Precautions Precautions: Fall Restrictions Weight Bearing Restrictions: Yes RLE Weight Bearing: Non weight bearing General:   Vital Signs:   Pain:   Other Treatments:    See Function Navigator for Current  Functional Status.   Therapy/Group: Individual Therapy  Willeen Cass [facility] 01/25/2016, 10:30 AM

## 2016-01-26 ENCOUNTER — Inpatient Hospital Stay (HOSPITAL_COMMUNITY): Payer: Commercial Managed Care - HMO | Admitting: Occupational Therapy

## 2016-01-26 ENCOUNTER — Inpatient Hospital Stay (HOSPITAL_COMMUNITY): Payer: Commercial Managed Care - HMO | Admitting: Physical Therapy

## 2016-01-26 DIAGNOSIS — E11649 Type 2 diabetes mellitus with hypoglycemia without coma: Secondary | ICD-10-CM | POA: Insufficient documentation

## 2016-01-26 LAB — BASIC METABOLIC PANEL
ANION GAP: 8 (ref 5–15)
BUN: 26 mg/dL — ABNORMAL HIGH (ref 6–20)
CALCIUM: 8.5 mg/dL — AB (ref 8.9–10.3)
CO2: 31 mmol/L (ref 22–32)
Chloride: 100 mmol/L — ABNORMAL LOW (ref 101–111)
Creatinine, Ser: 1.42 mg/dL — ABNORMAL HIGH (ref 0.61–1.24)
GFR, EST AFRICAN AMERICAN: 57 mL/min — AB (ref >60)
GFR, EST NON AFRICAN AMERICAN: 49 mL/min — AB (ref >60)
Glucose, Bld: 108 mg/dL — ABNORMAL HIGH (ref 65–99)
POTASSIUM: 4.4 mmol/L (ref 3.5–5.1)
SODIUM: 139 mmol/L (ref 135–145)

## 2016-01-26 LAB — GLUCOSE, CAPILLARY
GLUCOSE-CAPILLARY: 95 mg/dL (ref 65–99)
GLUCOSE-CAPILLARY: 179 mg/dL — AB (ref 65–99)
GLUCOSE-CAPILLARY: 194 mg/dL — AB (ref 65–99)
Glucose-Capillary: 181 mg/dL — ABNORMAL HIGH (ref 65–99)

## 2016-01-26 MED ORDER — INSULIN GLARGINE 100 UNIT/ML ~~LOC~~ SOLN
27.0000 [IU] | Freq: Every day | SUBCUTANEOUS | Status: DC
  Administered 2016-01-26 – 2016-01-27 (×2): 27 [IU] via SUBCUTANEOUS
  Filled 2016-01-26 (×3): qty 0.27

## 2016-01-26 MED ORDER — SENNOSIDES-DOCUSATE SODIUM 8.6-50 MG PO TABS
2.0000 | ORAL_TABLET | Freq: Every day | ORAL | Status: DC
  Administered 2016-01-29 – 2016-02-02 (×5): 2 via ORAL
  Filled 2016-01-26 (×7): qty 2

## 2016-01-26 MED ORDER — SENNOSIDES-DOCUSATE SODIUM 8.6-50 MG PO TABS
2.0000 | ORAL_TABLET | Freq: Once | ORAL | Status: AC
  Administered 2016-01-26: 2 via ORAL
  Filled 2016-01-26: qty 2

## 2016-01-26 NOTE — Progress Notes (Signed)
Occupational Therapy Session Note  Patient Details  Name: Thomas Wright MRN: [id number] Date of Birth: 1945/10/22  Today's Date: 01/26/2016 OT Individual Time: [id number] OT Individual Time Calculation (min): 70 min   Short Term Goals: Week 1:  OT Short Term Goal 1 (Week 1): Pt will transfer to drop arm BSC with min A OT Short Term Goal 2 (Week 1): Pt will stand for 30 seconds with steadying assist in prep for functional task OT Short Term Goal 3 (Week 1): Pt will dress LB with steadying assist OT Short Term Goal 4 (Week 1): Pt will tolerate full bathing/dressing session with 2 rest breaks in order to increase functional activity tolerance  Skilled Therapeutic Interventions/Progress Updates:  Patient found seated in w/c, sleepy. Pt with 4/10 complaints of aching pain in R residual limb, pt reports he's not due for pain medication until around 3pm - therapist monitored pain during session. Pt with some house measurements, bedroom & bathroom entrance=29.5". Pt propelled self from room to ADL apartment, needing multiple rest breaks on the way. During w/c propulsion, therapist educated pt on ways for energy conservation and ways to increase technique/independence with w/c propulsion. Once in ADL apartment, educated pt on use of tub transfer bench in tub shower; including where he can purchase a bench.  Encouraged pt not to shower at home, unless he was with therapist. Discussed toileting needs and encouraged pt to use 3-n-1 in room, not in BR due to minimal space. Pt agreed with all these recommendations. Pt propelled self from ADL apartment to therapy gym. In therapy gym, pt transferred w/c to edge of mat. Pt sat edge of mat and therapist worked with patient on doffing/donning compression wrapping. Administered LH inspection mirror for patient to check right residual limb, LLE, and buttock for any redness/sores/pressure places. Pt then engaged in exercises - Therapeutic exercise seated edge of mat with 5lb  weighted bar: bilateral shoulder flexion(X10), bilateral shoulder protraction (x20), bilateral shoulder horizontal abduction/adduction(x10), bilateral shoulder press up (x10),  Seated in w/c: Wheelchair pushups (x10) Therapist assisted pt back to room and pt transferred back to bed. At end of session, left pt supine in bed with all needs within reach.   Therapy Documentation Precautions:  Precautions Precautions: Fall Restrictions Weight Bearing Restrictions: Yes RLE Weight Bearing: Non weight bearing  Vital Signs: Therapy Vitals Temp: 98.4 F (36.9 C) Temp Source: Oral Pulse Rate: 66 Resp: 17 BP: (!) 142/74 mmHg Patient Position (if appropriate): Lying Oxygen Therapy SpO2: 93 % O2 Device: Not Delivered  See Function Navigator for Current Functional Status.  Therapy/Group: Individual Therapy  Chrys Racer , MS, OTR/L, CLT Pager: [phone]  01/26/2016, 3:57 PM

## 2016-01-26 NOTE — Progress Notes (Signed)
Occupational Therapy Session Note  Patient Details  Name: Thomas Wright MRN: [id number] Date of Birth: [birth year]  Today's Date: 01/26/2016 OT Individual Time: 0900-1000 OT Individual Time Calculation (min): 60 min    Short Term Goals: Week 1:  OT Short Term Goal 1 (Week 1): Pt will transfer to drop arm BSC with min A OT Short Term Goal 2 (Week 1): Pt will stand for 30 seconds with steadying assist in prep for functional task OT Short Term Goal 3 (Week 1): Pt will dress LB with steadying assist OT Short Term Goal 4 (Week 1): Pt will tolerate full bathing/dressing session with 2 rest breaks in order to increase functional activity tolerance  Skilled Therapeutic Interventions/Progress Updates:    1:1 self care retraining at sink level - sitting in w/c. Pt able to perform supervision transfer; however with low bottom clearance due to decr weight shift forward even with verbal and tactile cues. Pt able to complete UB B/D with setup.  Continued focus on sit to stand with bilateral Ues pushing up from arm rest to come into standing with min to mod A.  Pt able to support self standing with one UE support while A with clothing management (up and down) and with wash bottom. A with washing left foot and in between toes.  Pt with continued left LE pitting edema- encouraged to have left LE elevated with rest.  Pt reports still using bed pan due to being "scaried" of transfer to [facility].  Focus on squat pivot transfers to drop arm BSC. Pt with low clearance with mod A transfer to [facility] (towards the right) and min A with higher clearance back to the w/c (towards the left).  Performed transfer 2x each with same amt of assist with long rest break in between.   Therapy Documentation Precautions:  Precautions Precautions: Fall Restrictions Weight Bearing Restrictions: Yes RLE Weight Bearing: Non weight bearing :  Pain:  no c/o   See Function Navigator for Current Functional Status.   Therapy/Group:  Individual Therapy  Willeen Cass [facility] 01/26/2016, 9:16 AM

## 2016-01-26 NOTE — Progress Notes (Addendum)
[place] PHYSICAL MEDICINE & REHABILITATION     PROGRESS NOTE  Subjective/Complaints:  Patient sitting up in bed. He notes that he had symptomatic relative hypoglycemia last night. He states that he was told because his blood sugars have been so high for so long as needed, his range needs to be between 140-160.  ROS: Denies phantom limb pain, CP, SOB, N/V/D.   Objective: Vital Signs: Blood pressure 142/74, pulse 66, temperature 98.4 F (36.9 C), temperature source Oral, resp. rate 17, height 6\' 1"  (1.854 m), weight 91.6 kg (201 lb 15.1 oz), SpO2 93 %. No results found.  Recent Labs  01/23/16 1210  WBC 7.6  HGB 9.4*  HCT 29.6*  PLT 367    Recent Labs  01/23/16 1210 01/26/16 0503  NA 135 139  K 4.9 4.4  CL 99* 100*  GLUCOSE 166* 108*  BUN 22* 26*  CREATININE 1.48* 1.42*  CALCIUM 8.0* 8.5*   CBG (last 3)   Recent Labs  01/25/16 1711 01/25/16 2109 01/26/16 0604  GLUCAP 113* 174* 95    Wt Readings from Last 3 Encounters:  01/25/16 91.6 kg (201 lb 15.1 oz)  01/17/16 111.721 kg (246 lb 4.8 oz)  01/04/16 106.595 kg (235 lb)    Physical Exam:  BP 142/74 mmHg  Pulse 66  Temp(Src) 98.4 F (36.9 C) (Oral)  Resp 17  Ht 6\' 1"  (1.854 m)  Wt 91.6 kg (201 lb 15.1 oz)  BMI 26.65 kg/m2  SpO2 93% Constitutional: He appears well-developed. Obese. NAD HENT: Normocephalic and atraumatic.  Eyes: Conjunctivae and EOM are normal.  Cardiovascular: Normal rate and regular rhythm. no murmurs  Respiratory: Effort normal and breath sounds normal. No respiratory distress. No wheezes or rales. GI: Soft. Bowel sounds are normal. He exhibits no distension.  Musculoskeletal: He exhibits Mild tenderness. He exhibits no edema.  Neurological: He is alert and oriented.  Motor: Bilateral upper extremity 5/5 proximal to distal Left lower extremity hip flexion, knee extension 5/5, ankle dorsi/plantar flexion 5/5 Right lower extremity hip flexion, Knee extension: 5/5  Skin:  BKA  with staples , mild serous drainage at edges  First digit left lower extremity with erythema (no edema, no tenderness)  Psychiatric: He has a normal mood and affect. His behavior is normal  Assessment/Plan: 1. Functional deficits secondary to right BKA which require 3+ hours per day of interdisciplinary therapy in a comprehensive inpatient rehab setting. Physiatrist is providing close team supervision and 24 hour management of active medical problems listed below. Physiatrist and rehab team continue to assess barriers to discharge/monitor patient progress toward functional and medical goals.  Function:  Bathing Bathing position   Position: Sitting EOB  Bathing parts Body parts bathed by patient: Right arm, Left arm, Chest, Abdomen, Front perineal area, Right upper leg, Left upper leg, Buttocks, Left lower leg Body parts bathed by helper: Back  Bathing assist Assist Level: Supervision or verbal cues   Set up : To obtain items  Upper Body Dressing/Undressing Upper body dressing   What is the patient wearing?: Pull over shirt/dress     Pull over shirt/dress - Perfomed by patient: Thread/unthread right sleeve, Thread/unthread left sleeve, Put head through opening, Pull shirt over trunk          Upper body assist Assist Level: Set up   Set up : To obtain clothing/put away  Lower Body Dressing/Undressing Lower body dressing   What is the patient wearing?: Pants, Ted Hose, Shoes     Pants- Performed by  patient: Thread/unthread right pants leg, Thread/unthread left pants leg, Pull pants up/down Pants- Performed by helper: Pull pants up/down Non-skid slipper socks- Performed by patient: Don/doff left sock   Socks - Performed by patient: Don/doff left sock (R n/a) Socks - Performed by helper: Don/doff left sock   Shoes - Performed by helper: Don/doff left shoe, Fasten left       TED Hose - Performed by helper: Don/doff left TED hose  Lower body assist Assist for lower body  dressing: Touching or steadying assistance (Pt > 75%)      Toileting Toileting   Toileting steps completed by patient: Adjust clothing prior to toileting, Performs perineal hygiene Toileting steps completed by helper: Adjust clothing prior to toileting, Performs perineal hygiene, Adjust clothing after toileting    Toileting assist Assist level: Touching or steadying assistance (Pt.75%)   Transfers Chair/bed transfer   Chair/bed transfer method: Lateral scoot Chair/bed transfer assist level: Supervision or verbal cues Chair/bed transfer assistive device: Armrests, Sliding board     Locomotion Ambulation Ambulation activity did not occur: Safety/medical concerns   Max distance: 74ft Assist level: Moderate assist (Pt 50 - 74%)   Wheelchair   Type: Manual Max wheelchair distance: 378ft Assist Level: Supervision or verbal cues  Cognition Comprehension Comprehension assist level: Follows complex conversation/direction with extra time/assistive device  Expression Expression assist level: Expresses complex ideas: With extra time/assistive device  Social Interaction Social Interaction assist level: Interacts appropriately with others with medication or extra time (anti-anxiety, antidepressant).  Problem Solving Problem solving assist level: Solves basic 90% of the time/requires cueing < 10% of the time  Memory Memory assist level: More than reasonable amount of time    Medical Problem List and Plan: 1. 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 DVT Prophylaxis/Anticoagulation: Continue Eliquis as prior to admission. 3. Pain Management: Hydrocodone and Robaxin as needed. Monitor with increased mobility 4. Diabetes mellitus with peripheral neuropathy. Hemoglobin A1c 12.6.   Per patient range to stay between 140-160  Lantus insulin 22 units daily, Increased to 27 units on 5/5, Increased to 30 units  on 5/10, Decreased back to 27 units of 5/11.   Check blood sugars before meals and at bedtime. Diabetic teaching  NovoLog 3 units 3 times a day with meals added on 5/4, Increased to 5 units on 5/8, Increased to 6 Units on 5/10  Will cont to monitor and adjust with increased activity 5. Neuropsych: This patient is capable of making decisions on his own behalf. 6. Skin/Wound Care: Routine skin checks.   Wound VAC DC'd on 5/7 7. Fluids/Electrolytes/Nutrition: Routine I&O   Mild hyponatremia: Resolved on 5/11 8.Leukocytosis. Slowly improving. Maxipime completed 01/17/2016  Appears to have resolved, WBCs 7.6 on 5/8  Cont to monitor 9. Hypertension/PAF.    Amiodarone 200 mg daily  Coreg 6.25 mg twice a day, increased to 12.5 on 5/5, increased to 25 on 5/8  Will cont to monitor and consider further adjustments if necessary 10. Chronic combined systolic and diastolic congestive heart failure. No signs of fluid overload. Weigh patient daily  Weight 98 KG on 5/5, 113 on 5/8, 90 on 5/8, 91 on 5/10-this appears to be more reliable, no weight taken on 5/11 due to bed malfunction 11. Acute renal insufficiency/presumably ischemic ATN. Renal ultrasound unremarkable.   Cr 1.42 on 5/11 (improving) 12. Hyperlipidemia. Lipitor 13. ABLA  Hb 9.4 on 5/8  Cont to monitor 14. ? Wagner Grade 1 ulcer  Distal  first digit of left lower extremity  Continue to monitor for increased erythema, edema, tenderness  LOS (Days) 9 A FACE TO FACE EVALUATION WAS PERFORMED  Graysin Luczynski Lorie Phenix 01/26/2016 9:17 AM

## 2016-01-26 NOTE — Progress Notes (Signed)
Physical Therapy Session Note  Patient Details  Name: Thomas Wright MRN: [id number] Date of Birth: 06-01-1946  Today's Date: 01/26/2016 PT Individual Time: 1100-1200 PT Individual Time Calculation (min): 60 min   Short Term Goals: Week 1:  PT Short Term Goal 1 (Week 1): Patient will performed Sb transfer with min A.  PT Short Term Goal 2 (Week 1): Patient will perform squat pivot transfer with mod A.  PT Short Term Goal 3 (Week 1): Patient will perform sit<>stand from [facility] with RW with mod A.  PT Short Term Goal 4 (Week 1): Patient will perform bed mobility with supervision A.  PT Short Term Goal 5 (Week 1): Patient will propell WC 150 with supervision A and min verbal instruction.   Skilled Therapeutic Interventions/Progress Updates:    Patient received sitting in [facility] and agreeable to PT. Pt performed WC mobility on level surface with supervision A from PT for 182ft, 251ft, 145ft and 54ft. Min cues provided for improved ROM with BUE propulsion. Patient also instructed in WC mobility on unlevel surface including 32ft on cement sidewalk and ascent and descent of 27ft handicap access ramp with supervision A from PT with max cues for improved forward lean and increased shoulder ROM for improved mechanical advantage and increased power with BUE propulsion.  Patient instructed in SB transfer with min A to drop arm BSC  For bowel movement with cues for DME part management as well as UE positioning. Mod A for mPT to don and doff pants sitting in BSC. Min A for lean L and R for personal hygiene and proper push through LLE to allow  Clearance to ease hygiene.  Patient attempted sit<>stand with RW from [facility] and unable to complete on this day due to Miller at end of PT session.  Patient left sitting in [facility] with call bell within reach.   Therapy Documentation Precautions:  Precautions Precautions: Fall Restrictions Weight Bearing Restrictions: Yes RLE Weight Bearing: Non weight bearing   See Function  Navigator for Current Functional Status.   Therapy/Group: Individual Therapy  Lorie Phenix 01/26/2016, 12:37 PM

## 2016-01-27 ENCOUNTER — Inpatient Hospital Stay (HOSPITAL_COMMUNITY): Payer: Commercial Managed Care - HMO | Admitting: Physical Therapy

## 2016-01-27 ENCOUNTER — Inpatient Hospital Stay (HOSPITAL_COMMUNITY): Payer: Commercial Managed Care - HMO | Admitting: Occupational Therapy

## 2016-01-27 DIAGNOSIS — L539 Erythematous condition, unspecified: Secondary | ICD-10-CM | POA: Insufficient documentation

## 2016-01-27 LAB — GLUCOSE, CAPILLARY
GLUCOSE-CAPILLARY: 127 mg/dL — AB (ref 65–99)
Glucose-Capillary: 137 mg/dL — ABNORMAL HIGH (ref 65–99)
Glucose-Capillary: 192 mg/dL — ABNORMAL HIGH (ref 65–99)
Glucose-Capillary: 172 mg/dL — ABNORMAL HIGH (ref 65–99)

## 2016-01-27 NOTE — Progress Notes (Signed)
Physical Therapy Weekly Progress Note  Patient Details  Name: Thomas Wright MRN: [id number] Date of Birth: 07/24/1946  Beginning of progress report period: Jan 18, 2016 End of progress report period: Jan 27, 2016  Today's Date: 01/27/2016 PT Individual Time: 0800-0900 PT Individual Time Calculation (min): 60 min   Patient has met 5 of 5 short term goals.    Patient continues to demonstrate the following deficits: Strength, mobility, Gait, transfers, endurance, and therefore will continue to benefit from skilled PT intervention to enhance overall performance with activity tolerance, balance, postural control, ability to compensate for deficits and functional use of  right lower extremity.  Patient progressing toward long term goals..  Continue plan of care.  PT Short Term Goals Week 1:  PT Short Term Goal 1 (Week 1): Patient will performed Sb transfer with min A.  PT Short Term Goal 1 - Progress (Week 1): Met PT Short Term Goal 2 (Week 1): Patient will perform squat pivot transfer with mod A.  PT Short Term Goal 2 - Progress (Week 1): Met PT Short Term Goal 3 (Week 1): Patient will perform sit<>stand from [facility] with RW with mod A.  PT Short Term Goal 3 - Progress (Week 1): Met PT Short Term Goal 4 (Week 1): Patient will perform bed mobility with supervision A.  PT Short Term Goal 4 - Progress (Week 1): Met PT Short Term Goal 5 (Week 1): Patient will propell WC 150 with supervision A and min verbal instruction.  PT Short Term Goal 5 - Progress (Week 1): Met Week 2:  PT Short Term Goal 1 (Week 2): STG=LTG due to estimated D/C date.   Skilled Therapeutic Interventions/Progress Updates:    Patient received supine in bed and agreeable to PT. Patient performed supine>sit transfer without cues or assistance from PT, but required min use of bed rails. Patient was able to don shirt and pants with supervision A sitting EOB with R and L lateral lean to don pants. PT wrapped RLE with ace bandage. SB  transfer with supervision A from PT and min cues for WC parts management.  Patient instructed in [facility] mobility for 132f on level surface without cues from PT, but noted to need increased time and effort. Patient instructed in [facility] in [facility] and though door with llimitations of 36 inches in hall and 29.5 inches through door x 3. PT was required to provide mod cues for [facility]. For second and third attempt PT instructed patient to remove BLE leg supports for use of LLE to navigate and decrease turning radius of WC.   Squat pivot performed x 1 with mod A from PT. With cues UE placement and improved push through LLE to increase clearance for gluteal region from seat.  Sit<>stand mod A x 1 and Min A x 4 with RW. Mod cues for improved LE positioning and improved head/hip relationship to improve balance and weight distribution through LLE. Stand pivot. With mod A and RW x 1. PT provided max visual/verbal cues for improved push through UE to allow weight bearing through ball of foot to rotate LE for pivot. Patient demonstrated decreased safety with descent into chair, due to UE fatigue with transfer  Press ups, 10 second hold x 3 in WC with push through BUE and LLE to improved endurance and UE/LE strengthening.  [facility] to [facility]   Therapy Documentation Precautions:  Precautions Precautions: Fall Restrictions Weight Bearing Restrictions:  Yes RLE Weight Bearing: Non weight bearing    Pain: Pain Assessment Pain Assessment: 0-10 Pain Score: 0-No pain   See Function Navigator for Current Functional Status.  Therapy/Group: Individual Therapy  Lorie Phenix 01/27/2016, 12:22 PM

## 2016-01-27 NOTE — Progress Notes (Signed)
Physical Therapy Session Note  Patient Details  Name: Thomas Wright MRN: [id number] Date of Birth: 11-13-1945  Today's Date: 01/27/2016 PT Individual Time: 1455-1533 PT Individual Time Calculation (min): 38 min   Short Term Goals: Week 2:  PT Short Term Goal 1 (Week 2): STG=LTG due to estimated D/C date.   Skilled Therapeutic Interventions/Progress Updates:   Pt received in bed; no c/o pain.  Pt performed rolling and supine > sit with bed rails and supervision.  Pt requesting to attempt to perform squat pivot transfer bed > w/c.  Pt able to perform lateral scoots along bed with supervision but required min-mod A to complete squat pivot to w/c safely due to inadequate forwards lean and weight shift, RLE and buttocks not clearing cushion and pt unable to perform full pivot due to weakness.    Performed w/c mobility training due to pt reporting fatigue after 100'.  Educated pt on more efficient propulsion sequence with larger, full UE ROM strokes and allowing wheel to make 1-2 revolutions before pushing again for energy and joint conservation.  In gym performed slideboard transfer w/c > mat with pt performing w/c set up and transfer with supervision and min verbal cues for sequence.  On mat performed UE strengthening and sit > squat training with focus on initiating weight shift forwards with trunk elongation and anterior pelvic tilt and maintaining squat during lateral scooting and x 8-10 seconds at a time with UE pushing through push up blocks.  Also performed bilat UE resisted scapular retractions and UE extension with orange theraband x 10 reps each UE.  Returned to w/c squat pivot without board with min-mod A.  Back in room pt performed slideboard into bed due to UE and LE fatigue and required min A for safety due to pt leaning back on bed too soon while halfway across board.  Pt required min A for sit > supine and repositioning in bed.  Pt provided with handout of UE HEP to be reviewed in therapy.   Pt left in bed with all items within reach.   Therapy Documentation Precautions:  Precautions Precautions: Fall Restrictions Weight Bearing Restrictions: Yes RLE Weight Bearing: Non weight bearing Vital Signs: Therapy Vitals Temp: 98.4 F (36.9 C) Temp Source: Oral Pulse Rate: 67 Resp: 18 BP: (!) 149/62 mmHg Patient Position (if appropriate): Sitting Oxygen Therapy SpO2: 98 % O2 Device: Not Delivered Pain: Pain Assessment Pain Assessment: 0-10 Pain Score: 0-No pain   See Function Navigator for Current Functional Status.   Therapy/Group: Individual Therapy  Raylene Everts [facility] 01/27/2016, 4:00 PM

## 2016-01-27 NOTE — Progress Notes (Signed)
Social Work Patient ID: Thomas Wright, male   DOB: 1946/08/07, 70 y.o.   MRN: [id number]   CSW met with pt to update him on team conference discussion.  Pt is pleased he is on target to d/c on 02-03-16 and may possibly have a friend's dtr to help him to transition at home.  Pt is feeling better about d/c and feels things are falling into place.  CSW to help with DME and pt will think about how he wants to have bed delivered.  CSW will continue to follow and assist as needed.

## 2016-01-27 NOTE — Progress Notes (Signed)
Occupational Therapy Weekly Progress Note  Patient Details  Name: Thomas Wright MRN: [id number] Date of Birth: 13-Jun-1946  Beginning of progress report period: Jan 18, 2016 End of progress report period: Jan 27, 2016  Today's Date: 01/27/2016 OT Individual Time: 1030-1200 OT Individual Time Calculation (min): 90 min    Patient has met 4 of 4 short term goals.  Pt making good progress towards OT goals, however, is limited by generalized weakness and anxiousness with all mobility and standing tasks.  Patient continues to demonstrate the following deficits: acute pain and muscle weakness (generalized) and therefore will continue to benefit from skilled OT intervention to enhance overall performance with BADL, iADL and Reduce care partner burden.  Patient progressing toward long term goals. , however goals have been modified. Have d/c shower transfer goal as it is unsafe at this time for pt to complete independently. Standing goal downgraded to min A and toilet transfer downgraded to supervision for safety.   Bathing, toileting, and LB dressing tasks remain at mod I level with pt completing at bed level or via lateral leans for clothing management/ hygiene. Pt will likely require mod-max A for these tasks if it were to be done in standing position.   OT Short Term Goals Week 1:  OT Short Term Goal 1 (Week 1): Pt will transfer to drop arm BSC with min A OT Short Term Goal 1 - Progress (Week 1): Met OT Short Term Goal 2 (Week 1): Pt will stand for 30 seconds with steadying assist in prep for functional task OT Short Term Goal 2 - Progress (Week 1): Met OT Short Term Goal 3 (Week 1): Pt will dress LB with steadying assist OT Short Term Goal 3 - Progress (Week 1): Met OT Short Term Goal 4 (Week 1): Pt will tolerate full bathing/dressing session with 2 rest breaks in order to increase functional activity tolerance OT Short Term Goal 4 - Progress (Week 1): Met Week 2:  OT Short Term Goal 1 (Week 2):  STG=LTG due to LOS  Skilled Therapeutic Interventions/Progress Updates:    Pt seen for OT ADL bathing/dressing session. Pt sitting up in w/c upon arrival, agreeable to tx session. He completed bathing/dressing from w/c at sink. Pt positioned next to bed in w/c and chair placed on other side for pt to complete lateral leans to pull pants up following VCs for technique.  Grooming completed mod I from w/c level.  He completed x2 sit <> stand from w/c at sink with mod-max A and VCs for "rocking" technique and hand placement. Pt tolerated 12 seconds of static standing during first trial and 45 seconds on second trial with B UE support on sink ledge.  He completed min A sliding board transfer w/c <> drop arm BSC with assist to steady equipment and VCs for hand placement and weight shift technique. He declined wanting to practice clothing management on toilet. He self propelled w/c to therapy gym for UE strengthening, requiring rest breaks throughout.  He completed x2 sets of 10 reps of bicep curl, overhead press, and chest press using #5 dowel with demonstration and VCs for proper form and technique. Education and demonstration provided for proper technique of ACE wrapping for residual limb. Pt returned to room at end of session, left sitting in w/c with all needs in reach.  Discussed at length d/c planing and home set-up for ADL tasks with energy conservation and simplification in mind as pt possibliy d/c home indepdnently.   Therapy Documentation  Precautions:  Precautions Precautions: Fall Restrictions Weight Bearing Restrictions: Yes RLE Weight Bearing: Non weight bearing Pain:   No/denies pain  See Function Navigator for Current Functional Status.   Therapy/Group: Individual Therapy  Lewis, Lanecia Sliva C 01/27/2016, 7:14 AM

## 2016-01-27 NOTE — Plan of Care (Signed)
Problem: RH Tub/Shower Transfers Goal: LTG Patient will perform tub/shower transfers w/assist (OT) LTG: Patient will perform tub/shower transfers with assist, with/without cues using equipment (OT)  Outcome: Not Applicable Date Met:  06/89/34 Goal d/c due to being unsafe at this time. - AL

## 2016-01-27 NOTE — Progress Notes (Signed)
[place] PHYSICAL MEDICINE & REHABILITATION     PROGRESS NOTE  Subjective/Complaints:  Pt sitting up in bed this AM.  He states he is sleeping better.  He still notes some mild hypoglycemic symptoms (improving), but informed pt that medications were adjusted and it would take a couple of days to improve.   ROS: Denies phantom limb pain, CP, SOB, N/V/D.   Objective: Vital Signs: Blood pressure 132/61, pulse 73, temperature 98.5 F (36.9 C), temperature source Oral, resp. rate 18, height 6\' 1"  (1.854 m), weight 106.4 kg (234 lb 9.1 oz), SpO2 97 %. No results found. No results for input(s): WBC, HGB, HCT, PLT in the last 72 hours.  Recent Labs  01/26/16 0503  NA 139  K 4.4  CL 100*  GLUCOSE 108*  BUN 26*  CREATININE 1.42*  CALCIUM 8.5*   CBG (last 3)   Recent Labs  01/26/16 1626 01/26/16 2054 01/27/16 0646  GLUCAP 181* 194* 137*    Wt Readings from Last 3 Encounters:  01/27/16 106.4 kg (234 lb 9.1 oz)  01/17/16 111.721 kg (246 lb 4.8 oz)  01/04/16 106.595 kg (235 lb)    Physical Exam:  BP 132/61 mmHg  Pulse 73  Temp(Src) 98.5 F (36.9 C) (Oral)  Resp 18  Ht 6\' 1"  (1.854 m)  Wt 106.4 kg (234 lb 9.1 oz)  BMI 30.95 kg/m2  SpO2 97% Constitutional: He appears well-developed. Obese. NAD HENT: Normocephalic and atraumatic.  Eyes: Conjunctivae and EOM are normal.  Cardiovascular: Normal rate and regular rhythm. no murmurs  Respiratory: Effort normal and breath sounds normal. No respiratory distress. No wheezes or rales. GI: Soft. Bowel sounds are normal. He exhibits no distension.  Musculoskeletal: He exhibits Mild tenderness. He exhibits no edema.  Neurological: He is alert and oriented.  Motor: Bilateral upper extremity 5/5 proximal to distal Left lower extremity hip flexion, knee extension 5/5, ankle dorsi/plantar flexion 5/5 Right lower extremity hip flexion, Knee extension: 5/5  Skin:  BKA with staples , mild serous drainage at edges (improving) and mild  erythema lateral, inferior stump.  First digit left lower extremity with mild erythema (improving) (no edema, no tenderness)  Psychiatric: He has a normal mood and affect. His behavior is normal  Assessment/Plan: 1. Functional deficits secondary to right BKA which require 3+ hours per day of interdisciplinary therapy in a comprehensive inpatient rehab setting. Physiatrist is providing close team supervision and 24 hour management of active medical problems listed below. Physiatrist and rehab team continue to assess barriers to discharge/monitor patient progress toward functional and medical goals.  Function:  Bathing Bathing position   Position: Sitting EOB  Bathing parts Body parts bathed by patient: Right arm, Left arm, Chest, Abdomen, Front perineal area, Right upper leg, Left upper leg, Buttocks, Left lower leg Body parts bathed by helper: Back  Bathing assist Assist Level: Supervision or verbal cues   Set up : To obtain items  Upper Body Dressing/Undressing Upper body dressing   What is the patient wearing?: Pull over shirt/dress     Pull over shirt/dress - Perfomed by patient: Thread/unthread right sleeve, Thread/unthread left sleeve, Put head through opening, Pull shirt over trunk          Upper body assist Assist Level: Set up   Set up : To obtain clothing/put away  Lower Body Dressing/Undressing Lower body dressing   What is the patient wearing?: Pants, Ted Hose, Shoes     Pants- Performed by patient: Thread/unthread right pants leg, Thread/unthread left  pants leg, Pull pants up/down Pants- Performed by helper: Pull pants up/down Non-skid slipper socks- Performed by patient: Don/doff left sock   Socks - Performed by patient: Don/doff left sock (R n/a) Socks - Performed by helper: Don/doff left sock   Shoes - Performed by helper: Don/doff left shoe, Fasten left       TED Hose - Performed by helper: Don/doff left TED hose  Lower body assist Assist for lower  body dressing: Touching or steadying assistance (Pt > 75%)      Toileting Toileting   Toileting steps completed by patient: Adjust clothing prior to toileting, Performs perineal hygiene Toileting steps completed by helper: Adjust clothing prior to toileting, Performs perineal hygiene, Adjust clothing after toileting    Toileting assist Assist level: Touching or steadying assistance (Pt.75%)   Transfers Chair/bed transfer   Chair/bed transfer method: Lateral scoot Chair/bed transfer assist level: Supervision or verbal cues Chair/bed transfer assistive device: Armrests, Sliding board     Locomotion Ambulation Ambulation activity did not occur: Safety/medical concerns   Max distance: 30ft Assist level: Moderate assist (Pt 50 - 74%)   Wheelchair   Type: Manual Max wheelchair distance: 371ft Assist Level: Supervision or verbal cues  Cognition Comprehension Comprehension assist level: Follows complex conversation/direction with extra time/assistive device  Expression Expression assist level: Expresses complex ideas: With no assist  Social Interaction Social Interaction assist level: Interacts appropriately with others with medication or extra time (anti-anxiety, antidepressant).  Problem Solving Problem solving assist level: Solves complex 90% of the time/cues < 10% of the time  Memory Memory assist level: More than reasonable amount of time    Medical Problem List and Plan: 1. 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 DVT Prophylaxis/Anticoagulation: Continue Eliquis as prior to admission. 3. Pain Management: Hydrocodone and Robaxin as needed. Monitor with increased mobility 4. Diabetes mellitus with peripheral neuropathy. Hemoglobin A1c 12.6.   Per patient range to stay between 140-160  Lantus insulin 22 units daily, Increased to 27 units on 5/5, Increased to 30 units on 5/10, Decreased  back to 27 units of 5/11.   Check blood sugars before meals and at bedtime. Diabetic teaching  NovoLog 3 units 3 times a day with meals added on 5/4, Increased to 5 units on 5/8, Increased to 6 Units on 5/10  Will cont to monitor and adjust with increased activity 5. Neuropsych: This patient is capable of making decisions on his own behalf. 6. Skin/Wound Care: Routine skin checks.   Wound VAC DC'd on 5/7 7. Fluids/Electrolytes/Nutrition: Routine I&O   Mild hyponatremia: Resolved on 5/11 8.Leukocytosis. Slowly improving. Maxipime completed 01/17/2016  Appears to have resolved, WBCs 7.6 on 5/8  Labs ordered for Monday 9. Hypertension/PAF.    Amiodarone 200 mg daily  Coreg 6.25 mg twice a day, increased to 12.5 on 5/5, increased to 25 on 5/8  Improved overall  Will cont to monitor and consider further adjustments if necessary 10. Chronic combined systolic and diastolic congestive heart failure. No signs of fluid overload. Weigh patient daily  Weight 98 KG on 5/5, 113 on 5/8, 90 on 5/8, 91 on 5/10, 106 on 5/12 - unreliable readings, clinically stable 11. Acute renal insufficiency/presumably ischemic ATN. Renal ultrasound unremarkable.   Cr 1.42 on 5/11 (improving)  Labs ordered for Monday 12. Hyperlipidemia. Lipitor 13. ABLA  Hb 9.4 on 5/8  Labs ordered for Monday 14. ?Wagner Grade 1 ulcer vs. Trauma (likely latter)  Distal first digit  of left lower extremity, improving  Continue to monitor for increased erythema, edema, tenderness  LOS (Days) 10 A FACE TO FACE EVALUATION WAS PERFORMED  Sherry Rogus Lorie Phenix 01/27/2016 9:18 AM

## 2016-01-27 NOTE — Patient Care Conference (Signed)
Inpatient RehabilitationTeam Conference and Plan of Care Update Date: 01/25/2016   Time: 2:00 PM    Patient Name: Thomas Wright      Medical Record Number: [id number]  Date of Birth: [birth year] Sex: Male         Room/Bed: 4W03C/4W03C-01 Payor Info: Payor: [employer] MEDICARE / Plan: [employer] GOLD PLUS HMO THN/NTSP / Product Type: *No Product type* /    Admitting Diagnosis: R Bka  Admit Date/Time:  01/17/2016  4:30 PM Admission Comments: No comment available   Primary Diagnosis:  Status post below knee amputation of right lower extremity (HCC) Principal Problem: Status post below knee amputation of right lower extremity [facility])  Patient Active Problem List   Diagnosis Date Noted  . Hypoglycemia associated with type 2 diabetes mellitus ([place])   . Hyponatremia   . Diarrhea 01/17/2016  . Amputation of right lower extremity below knee with complication ([place]) [id number]  . AKI (acute kidney injury) ([place])   . Type 2 diabetes mellitus with peripheral neuropathy (HCC)   . Status post aortic valve replacement   . Chronic combined systolic and diastolic congestive heart failure ([place])   . ATN (acute tubular necrosis) ([place])   . Postoperative pain   . Acute blood loss anemia   . Leukocytosis   . Morbid obesity ([place])   . Status post below knee amputation of right lower extremity ([place])   . Insulin dependent diabetes mellitus ([place]) 01/09/2016  . Diabetic foot infection ([place]) 01/09/2016  . Sepsis, unspecified organism ([place]) 01/09/2016  . PAF (paroxysmal atrial fibrillation) ([place]) 04/22/2015  . Essential hypertension 04/22/2015  . Obesity 04/22/2015  . Dyspnea 04/20/2015  . Acute on chronic diastolic CHF (congestive heart failure) ([place]) 04/19/2015  . Cellulitis of leg 11/23/2014  . Diabetes ([place]) 09/13/2014  . Chronic anticoagulation 09/13/2014  . Atrial flutter, unspecified   . Paroxysmal atrial fibrillation (HCC)   . Perirectal abscess   . Other hemorrhoids   . Tenosynovitis of finger 05/05/2014  .  Cellulitis and abscess of digit 05/05/2014  . Back pain   . H/O aortic valve replacement with tissue graft   . Hypertriglyceridemia   . Hypertension     Expected Discharge Date: Expected Discharge Date: 02/03/16  Team Members Present: Physician leading conference: Dr. Delice Lesch Social Worker Present: Ovidio Kin, LCSW Nurse Present: Dorien Chihuahua, RN PT Present: Other (comment) Barrie Folk, PT; Lavone Nian, PT) OT Present: Salome Spotted, OT SLP Present: Windell Moulding, SLP PPS Coordinator present : Daiva Nakayama, RN, CRRN     Current Status/Progress Goal Weekly Team Focus  Medical   Limited mobility secondary to right below-knee amputation 01/13/2016 secondary to gangrenous changes/multi-medical  Improvement DM meds, safety, strength, mobility, endurance  See above   Bowel/Bladder   continent of bladder and bowel  Remaion cont x2  Monitor B&B function   Swallow/Nutrition/ Hydration             ADL's   Min A BADL, Steadying/Supervision for lateral scoot transfers  Overall Mod I with supervision for homemaking and shower transfers  Adapted bathing/dressing skills, sit<>stand supported, w/c mobility, transfers, improved awareness, activity tolerance, BUE strengthening   Mobility   supervision for sliding board transfers, min A for sit<>stand in parallel bars, able to stand up to 1 min 30 sec max, supervision for w/c mobility with fair endurance, fatigues quickly  mod I sitting balance, mod i bed mobility & transfers, mod I w/c mobility  endurance training, standing tolerance, strengthening, gait training, w/c mobility  Communication             Safety/Cognition/ Behavioral Observations            Pain   Vicodin 2 tabs q4hrs prn given, C/o r stump pain    Pain <3  Assess and treat pain during shift   Skin   Incision to rt leg-CDI, MASD dto bottom, thigh  No skion breakdown  Assess skin q shift    Rehab Goals Patient on target to meet rehab goals: Yes Rehab Goals  Revised: none *See Care Plan and progress notes for long and short-term goals.  Barriers to Discharge: Uncontrolled DM, ATN, safety edu, hyponatremia, HTN, heart failure, ABLA    Possible Resolutions to Barriers:  Optimize DM meds, pt edu, therapies, optimize BP meds, follow labs and weights     Discharge Planning/Teaching Needs:  Pt plans to return to his home with landlord building ramp.  Pt's goals are modified independent.  Pt may have a friend who can help him transition home.  If so, she is welcome to come in for family education.   Team Discussion:  Pt has slow progress with poor endurance.  Team would like to do a home visit with pt, but his ramp will not be done until 02-02-16.  Pt has mod I from w/c goals.  Medical team is trying to stabilize pt's blood sugars.  Pt will need some DME and HH for f/u therapies.    Revisions to Treatment Plan:  none   Continued Need for Acute Rehabilitation Level of Care: The patient requires daily medical management by a physician with specialized training in physical medicine and rehabilitation for the following conditions: Daily direction of a multidisciplinary physical rehabilitation program to ensure safe treatment while eliciting the highest outcome that is of practical value to the patient.: Yes Daily medical management of patient stability for increased activity during participation in an intensive rehabilitation regime.: Yes Daily analysis of laboratory values and/or radiology reports with any subsequent need for medication adjustment of medical intervention for : Wound care problems;Post surgical problems;Diabetes problems;Blood pressure problems  Moody Robben, Silvestre Mesi 01/27/2016, 1:18 AM

## 2016-01-28 ENCOUNTER — Inpatient Hospital Stay (HOSPITAL_COMMUNITY): Payer: Commercial Managed Care - HMO | Admitting: Physical Therapy

## 2016-01-28 LAB — GLUCOSE, CAPILLARY
GLUCOSE-CAPILLARY: 134 mg/dL — AB (ref 65–99)
GLUCOSE-CAPILLARY: 162 mg/dL — AB (ref 65–99)
GLUCOSE-CAPILLARY: 235 mg/dL — AB (ref 65–99)
Glucose-Capillary: 75 mg/dL (ref 65–99)
Glucose-Capillary: 119 mg/dL — ABNORMAL HIGH (ref 65–99)
Glucose-Capillary: 188 mg/dL — ABNORMAL HIGH (ref 65–99)

## 2016-01-28 MED ORDER — INSULIN GLARGINE 100 UNIT/ML ~~LOC~~ SOLN
25.0000 [IU] | Freq: Every day | SUBCUTANEOUS | Status: DC
  Filled 2016-01-28 (×2): qty 0.25

## 2016-01-28 NOTE — Plan of Care (Signed)
Problem: RH Ambulation Goal: LTG Patient will ambulate in controlled environment (PT) LTG: Patient will ambulate in a controlled environment, # of feet with assistance (PT).  Down graded due to slow progress Goal: LTG Patient will ambulate in home environment (PT) LTG: Patient will ambulate in home environment, # of feet with assistance (PT).  Outcome: Not Applicable Date Met:  58/85/02 Discontinued due to slow progress  Problem: RH Stairs Goal: LTG Patient will ambulate up and down stairs w/assist (PT) LTG: Patient will ambulate up and down # of stairs with assistance (PT)  Outcome: Not Applicable Date Met:  77/41/28 Discontinued due to ramp being built into house

## 2016-01-28 NOTE — Progress Notes (Signed)
Physical Therapy Session Note  Patient Details  Name: Thomas Wright MRN: [id number] Date of Birth: 1945-09-26  Today's Date: 01/28/2016 PT Individual Time: 5732-2025 PT Individual Time Calculation (min): 38 min   Short Term Goals: Week 1:  PT Short Term Goal 1 (Week 1): Patient will performed Sb transfer with min A.  PT Short Term Goal 1 - Progress (Week 1): Met PT Short Term Goal 2 (Week 1): Patient will perform squat pivot transfer with mod A.  PT Short Term Goal 2 - Progress (Week 1): Met PT Short Term Goal 3 (Week 1): Patient will perform sit<>stand from [facility] with RW with mod A.  PT Short Term Goal 3 - Progress (Week 1): Met PT Short Term Goal 4 (Week 1): Patient will perform bed mobility with supervision A.  PT Short Term Goal 4 - Progress (Week 1): Met PT Short Term Goal 5 (Week 1): Patient will propell WC 150 with supervision A and min verbal instruction.  PT Short Term Goal 5 - Progress (Week 1): Met  Week 2: PT Short term Goal 1: LTG=STG due to estimated d/c date.    Skilled Therapeutic Interventions/Progress Updates:    Patient received supine in bed and agreeable to PT. Patient performed bed mobility for sup>sit without cues from PT and donned shirt sitting EOB without assistance. Patient performed SB transfer with supervision A with min cues for SB placement and WC parts management to prepare for transfer. WC mobility performed in hall for 235f without cues from PT, but noted increase time and effort with Turns and navigation in room; patient able to open door to room with supervision A with min cues for improved WC positioning.  SB transfer to mat table in gym with supervision A and mod cueing for [employer]positioning prior to transfer and cues to make sure brakes are engaged. Patient performed sit>supine without cues from PT. Supine therex: bridges with LLE and push through bolster on R LE x 13; SLR BLE x 12; seated LAQ with level 2 tband x 2.  Upon completion of therex PT educated  patient on the importance of having assistance folowing D/C from CIR. Patient reports that he will have some one staying with him for at least 2 months start 3 days after d/c, and that he is working to have someone stay with him in the evenings immediately following d/c.  [facility] returned to room and [facility] reach.     Therapy Documentation Precautions:  Precautions Precautions: Fall Restrictions Weight Bearing Restrictions: Yes RLE Weight Bearing: Non weight bearing   Pain: Pain Assessment Pain Assessment: 0-10 Pain Score: 6  Pain Type: Acute pain Pain Location: Leg Pain Orientation: Right Pain Descriptors / Indicators: Aching Pain Frequency: Constant Pain Onset: Gradual Pain Intervention(s): Medication (See eMAR)  See Function Navigator for Current Functional Status.   Therapy/Group: Individual Therapy  ALorie Phenix5/13/2017, 10:29 AM

## 2016-01-28 NOTE — Progress Notes (Signed)
[place] PHYSICAL MEDICINE & REHABILITATION     PROGRESS NOTE  Subjective/Complaints:  Hypoglycemic again this morning---75, symptomatic. Waiting for OJ. Otherwise things going well.    ROS: Denies phantom limb pain, CP, SOB, N/V/D.   Objective: Vital Signs: Blood pressure 153/65, pulse 66, temperature 97.8 F (36.6 C), temperature source Oral, resp. rate 18, height 6\' 1"  (1.854 m), weight 107.2 kg (236 lb 5.3 oz), SpO2 97 %. No results found. No results for input(s): WBC, HGB, HCT, PLT in the last 72 hours.  Recent Labs  01/26/16 0503  NA 139  K 4.4  CL 100*  GLUCOSE 108*  BUN 26*  CREATININE 1.42*  CALCIUM 8.5*   CBG (last 3)   Recent Labs  01/27/16 2050 01/28/16 0020 01/28/16 0725  GLUCAP 172* 134* 75    Wt Readings from Last 3 Encounters:  01/28/16 107.2 kg (236 lb 5.3 oz)  01/17/16 111.721 kg (246 lb 4.8 oz)  01/04/16 106.595 kg (235 lb)    Physical Exam:  BP 153/65 mmHg  Pulse 66  Temp(Src) 97.8 F (36.6 C) (Oral)  Resp 18  Ht 6\' 1"  (1.854 m)  Wt 107.2 kg (236 lb 5.3 oz)  BMI 31.19 kg/m2  SpO2 97% Constitutional: He appears well-developed. Obese. NAD HENT: Normocephalic and atraumatic.  Eyes: Conjunctivae and EOM are normal.  Cardiovascular: Normal rate and regular rhythm. no murmurs  Respiratory: Effort normal and breath sounds normal. No respiratory distress. No wheezes or rales. GI: Soft. Bowel sounds are normal. He exhibits no distension.  Musculoskeletal: He exhibits Mild tenderness. He exhibits no edema.  Neurological: He is alert and oriented.  Motor: Bilateral upper extremity 5/5 proximal to distal Left lower extremity hip flexion, knee extension 5/5, ankle dorsi/plantar flexion 5/5 Right lower extremity hip flexion, Knee extension: 5/5  Skin:  BKA with staples , mild serous drainage at edges (improving) and mild erythema lateral, inferior stump.  First digit left lower extremity with mild erythema (improving) (no edema, no  tenderness)  Psychiatric: He has a normal mood and affect. His behavior is normal  Assessment/Plan: 1. Functional deficits secondary to right BKA which require 3+ hours per day of interdisciplinary therapy in a comprehensive inpatient rehab setting. Physiatrist is providing close team supervision and 24 hour management of active medical problems listed below. Physiatrist and rehab team continue to assess barriers to discharge/monitor patient progress toward functional and medical goals.  Function:  Bathing Bathing position   Position: Wheelchair/chair at sink  Bathing parts Body parts bathed by patient: Right arm, Left arm, Chest, Abdomen, Front perineal area, Right upper leg, Left upper leg, Buttocks Body parts bathed by helper: Back  Bathing assist Assist Level: Supervision or verbal cues   Set up : To obtain items  Upper Body Dressing/Undressing Upper body dressing   What is the patient wearing?: Pull over shirt/dress     Pull over shirt/dress - Perfomed by patient: Thread/unthread right sleeve, Thread/unthread left sleeve, Put head through opening, Pull shirt over trunk          Upper body assist Assist Level: More than reasonable time   Set up : To obtain clothing/put away  Lower Body Dressing/Undressing Lower body dressing   What is the patient wearing?: Pants     Pants- Performed by patient: Thread/unthread right pants leg, Thread/unthread left pants leg, Pull pants up/down Pants- Performed by helper: Pull pants up/down Non-skid slipper socks- Performed by patient: Don/doff left sock   Socks - Performed by patient: Don/doff left  sock (R n/a) Socks - Performed by helper: Don/doff left sock   Shoes - Performed by helper: Don/doff left shoe, Fasten left       TED Hose - Performed by helper: Don/doff left TED hose  Lower body assist Assist for lower body dressing: Touching or steadying assistance (Pt > 75%)      Toileting Toileting   Toileting steps completed  by patient: Adjust clothing prior to toileting, Performs perineal hygiene Toileting steps completed by helper: Adjust clothing prior to toileting, Performs perineal hygiene, Adjust clothing after toileting    Toileting assist Assist level: Touching or steadying assistance (Pt.75%)   Transfers Chair/bed transfer   Chair/bed transfer method: Lateral scoot Chair/bed transfer assist level: Touching or steadying assistance (Pt > 75%) Chair/bed transfer assistive device: Armrests, Sliding board     Locomotion Ambulation Ambulation activity did not occur: Safety/medical concerns   Max distance: 69ft Assist level: Moderate assist (Pt 50 - 74%)   Wheelchair   Type: Manual Max wheelchair distance: 150 Assist Level: Supervision or verbal cues  Cognition Comprehension Comprehension assist level: Follows complex conversation/direction with extra time/assistive device  Expression Expression assist level: Expresses complex ideas: With no assist  Social Interaction Social Interaction assist level: Interacts appropriately with others with medication or extra time (anti-anxiety, antidepressant).  Problem Solving Problem solving assist level: Solves complex 90% of the time/cues < 10% of the time  Memory Memory assist level: More than reasonable amount of time    Medical Problem List and Plan: 1. 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 DVT Prophylaxis/Anticoagulation: Continue Eliquis as prior to admission. 3. Pain Management: Hydrocodone and Robaxin as needed. Monitor with increased mobility 4. Diabetes mellitus with peripheral neuropathy. Hemoglobin A1c 12.6.   Per patient range to stay between 140-160  Lantus insulin 22 units daily, Increased to 27 units on 5/5, Increased to 30 units on 5/10, Decreased back to 27 units of 5/11.---reduce to 25u qhs today due to AM hypoglycemia  Check blood  sugars before meals and at bedtime. Diabetic teaching  NovoLog 3 units 3 times a day with meals added on 5/4, Increased to 5 units on 5/8, Increased to 6 Units on 5/10  Might benefit from an AM dose of lantus to help with late PM CBG's.  5. Neuropsych: This patient is capable of making decisions on his own behalf. 6. Skin/Wound Care: Routine skin checks.   Wound VAC DC'd on 5/7 7. Fluids/Electrolytes/Nutrition: Routine I&O   Mild hyponatremia: Resolved on 5/11 8.Leukocytosis. Slowly improving. Maxipime completed 01/17/2016  Appears to have resolved, WBCs 7.6 on 5/8  Labs ordered for Monday 9. Hypertension/PAF.    Amiodarone 200 mg daily  Coreg 6.25 mg twice a day, increased to 12.5 on 5/5, increased to 25 on 5/8  Improved overall  Will cont to monitor and consider further adjustments if necessary 10. Chronic combined systolic and diastolic congestive heart failure. No signs of fluid overload. Weigh patient daily  Weight   107 on 5/13 -   clinically stable 11. Acute renal insufficiency/presumably ischemic ATN. Renal ultrasound unremarkable.   Cr 1.42 on 5/11 (improving)  Labs ordered for Monday 12. Hyperlipidemia. Lipitor 13. ABLA  Hb 9.4 on 5/8  Labs ordered for Monday 14. ?Wagner Grade 1 ulcer vs. Trauma (likely latter)  Distal first digit of left lower extremity, improving  Continue to monitor for increased erythema, edema, tenderness  LOS (Days) 11 A FACE TO FACE EVALUATION WAS  PERFORMED  [place] Dalporto T 01/28/2016 8:56 AM

## 2016-01-29 ENCOUNTER — Inpatient Hospital Stay (HOSPITAL_COMMUNITY): Payer: Commercial Managed Care - HMO | Admitting: Physical Therapy

## 2016-01-29 LAB — GLUCOSE, CAPILLARY
GLUCOSE-CAPILLARY: 187 mg/dL — AB (ref 65–99)
Glucose-Capillary: 178 mg/dL — ABNORMAL HIGH (ref 65–99)
Glucose-Capillary: 147 mg/dL — ABNORMAL HIGH (ref 65–99)
Glucose-Capillary: 206 mg/dL — ABNORMAL HIGH (ref 65–99)
Glucose-Capillary: 227 mg/dL — ABNORMAL HIGH (ref 65–99)

## 2016-01-29 MED ORDER — GLUCOSE 40 % PO GEL
ORAL | Status: DC
Start: 2016-01-29 — End: 2016-01-29
  Filled 2016-01-29: qty 1

## 2016-01-29 MED ORDER — INSULIN ASPART 100 UNIT/ML ~~LOC~~ SOLN
3.0000 [IU] | Freq: Three times a day (TID) | SUBCUTANEOUS | Status: DC
  Administered 2016-01-29 – 2016-02-01 (×9): 3 [IU] via SUBCUTANEOUS

## 2016-01-29 MED ORDER — INSULIN GLARGINE 100 UNIT/ML ~~LOC~~ SOLN
5.0000 [IU] | Freq: Every day | SUBCUTANEOUS | Status: DC
  Administered 2016-01-29 – 2016-02-01 (×4): 5 [IU] via SUBCUTANEOUS
  Filled 2016-01-29 (×5): qty 0.05

## 2016-01-29 MED ORDER — INSULIN GLARGINE 100 UNIT/ML ~~LOC~~ SOLN
27.0000 [IU] | Freq: Every day | SUBCUTANEOUS | Status: DC
  Administered 2016-01-30 – 2016-02-02 (×3): 27 [IU] via SUBCUTANEOUS
  Filled 2016-01-29 (×6): qty 0.27

## 2016-01-29 NOTE — Progress Notes (Signed)
Pt reports feeling anxious, sweaty, and nauseated at 2255. Rechecked CBG and VS taken. Previous CBG at 2100 was 162, and at 2300 was 188, B/P-151/65, HR-70, RR-17, T-97.7, O2-91%RA. Pt was provided an hs snack around 2100. Pt refused scheduled Lantus insulin 25units due to symptoms he was experiencing. Zofran 4mg  was given at 2301. Re checked on patient about 30 min later, pt reports feeling a little better.  Next around 0330 pt reports feeling the same feelings as earlier in the shift and also c/o pain. Vicodin 2 tabs given at 343, rechecked patient about 45 min later, pt was asleep. Cont to monitor patient.  Sallee Hogrefe, Dione Plover

## 2016-01-29 NOTE — Plan of Care (Signed)
Problem: RH BOWEL ELIMINATION Goal: RH STG MANAGE BOWEL WITH ASSISTANCE STG Manage Bowel with min Assistance.  Outcome: Not Progressing Disimpacted of large hard stool today; sorbitol administered

## 2016-01-29 NOTE — Progress Notes (Signed)
Why PHYSICAL MEDICINE & REHABILITATION     PROGRESS NOTE  Subjective/Complaints:  Some anxiety overnight. Seems to have passed. Feels well this am.   ROS: Denies phantom limb pain, CP, SOB, N/V/D.   Objective: Vital Signs: Blood pressure 135/57, pulse 62, temperature 97.7 F (36.5 C), temperature source Oral, resp. rate 20, height 6\' 1"  (1.854 m), weight 108.6 kg (239 lb 6.7 oz), SpO2 100 %. No results found. No results for input(s): WBC, HGB, HCT, PLT in the last 72 hours. No results for input(s): NA, K, CL, GLUCOSE, BUN, CREATININE, CALCIUM in the last 72 hours.  Invalid input(s): CO CBG (last 3)   Recent Labs  01/28/16 2059 01/28/16 2227 01/29/16 0702  GLUCAP 162* 188* 178*    Wt Readings from Last 3 Encounters:  01/29/16 108.6 kg (239 lb 6.7 oz)  01/17/16 111.721 kg (246 lb 4.8 oz)  01/04/16 106.595 kg (235 lb)    Physical Exam:  BP 135/57 mmHg  Pulse 62  Temp(Src) 97.7 F (36.5 C) (Oral)  Resp 20  Ht 6\' 1"  (1.854 m)  Wt 108.6 kg (239 lb 6.7 oz)  BMI 31.59 kg/m2  SpO2 100% Constitutional: He appears well-developed. Obese. NAD HENT: Normocephalic and atraumatic.  Eyes: Conjunctivae and EOM are normal.  Cardiovascular: Normal rate and regular rhythm. no murmurs  Respiratory: Effort normal and breath sounds normal. No respiratory distress. No wheezes or rales. GI: Soft. Bowel sounds are normal. He exhibits no distension.  Musculoskeletal: He exhibits Mild tenderness. He exhibits no edema.  Neurological: He is alert and oriented.  Motor: Bilateral upper extremity 5/5 proximal to distal Left lower extremity hip flexion, knee extension 5/5, ankle dorsi/plantar flexion 5/5 Right lower extremity hip flexion, Knee extension: 5/5  Skin:  BKA with staples/dry/intact. First digit left lower extremity with mild erythema (improving) (no edema, no tenderness)  Psychiatric: He has a normal mood and affect. His behavior is normal  Assessment/Plan: 1.  Functional deficits secondary to right BKA which require 3+ hours per day of interdisciplinary therapy in a comprehensive inpatient rehab setting. Physiatrist is providing close team supervision and 24 hour management of active medical problems listed below. Physiatrist and rehab team continue to assess barriers to discharge/monitor patient progress toward functional and medical goals.  Function:  Bathing Bathing position   Position: Wheelchair/chair at sink  Bathing parts Body parts bathed by patient: Right arm, Left arm, Chest, Abdomen, Front perineal area, Right upper leg, Left upper leg, Buttocks Body parts bathed by helper: Back  Bathing assist Assist Level: Supervision or verbal cues   Set up : To obtain items  Upper Body Dressing/Undressing Upper body dressing   What is the patient wearing?: Pull over shirt/dress     Pull over shirt/dress - Perfomed by patient: Thread/unthread right sleeve, Thread/unthread left sleeve, Put head through opening, Pull shirt over trunk          Upper body assist Assist Level: More than reasonable time   Set up : To obtain clothing/put away  Lower Body Dressing/Undressing Lower body dressing   What is the patient wearing?: Pants     Pants- Performed by patient: Thread/unthread right pants leg, Thread/unthread left pants leg, Pull pants up/down Pants- Performed by helper: Pull pants up/down Non-skid slipper socks- Performed by patient: Don/doff left sock   Socks - Performed by patient: Don/doff left sock (R n/a) Socks - Performed by helper: Don/doff left sock   Shoes - Performed by helper: Don/doff left shoe, Fasten left  TED Hose - Performed by helper: Don/doff left TED hose  Lower body assist Assist for lower body dressing: Touching or steadying assistance (Pt > 75%)      Toileting Toileting   Toileting steps completed by patient: Adjust clothing prior to toileting, Performs perineal hygiene Toileting steps completed by  helper: Adjust clothing prior to toileting, Performs perineal hygiene, Adjust clothing after toileting    Toileting assist Assist level: Touching or steadying assistance (Pt.75%)   Transfers Chair/bed transfer   Chair/bed transfer method: Lateral scoot Chair/bed transfer assist level: Supervision or verbal cues Chair/bed transfer assistive device: Armrests, Sliding board     Locomotion Ambulation Ambulation activity did not occur: Safety/medical concerns   Max distance: 50ft Assist level: Moderate assist (Pt 50 - 74%)   Wheelchair   Type: Manual Max wheelchair distance: 150 Assist Level: No help, No cues, assistive device, takes more than reasonable amount of time  Cognition Comprehension Comprehension assist level: Follows complex conversation/direction with extra time/assistive device  Expression Expression assist level: Expresses complex ideas: With no assist  Social Interaction Social Interaction assist level: Interacts appropriately with others with medication or extra time (anti-anxiety, antidepressant).  Problem Solving Problem solving assist level: Solves complex 90% of the time/cues < 10% of the time  Memory Memory assist level: More than reasonable amount of time    Medical Problem List and Plan: 1. 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 DVT Prophylaxis/Anticoagulation: Continue Eliquis as prior to admission. 3. Pain Management: Hydrocodone and Robaxin as needed. Monitor with increased mobility 4. Diabetes mellitus with peripheral neuropathy. Hemoglobin A1c 12.6.   Per patient range to stay between 140-160  Lantus insulin--increase back to 27u qhs. Add scheduled am lantus 5u, reduce novolog to 3u tid w/ meals  Check blood sugars before meals and at bedtime. Need to avoid excessive rx of higher cbg's--hopefully am lantus will help decrease pm spikes.     5.  Neuropsych: This patient is capable of making decisions on his own behalf. 6. Skin/Wound Care: Routine skin checks.   -wound examined and re-dressed. Continue current care 7. Fluids/Electrolytes/Nutrition: Routine I&O   Mild hyponatremia: Resolved on 5/11 8.Leukocytosis. Slowly improving. Maxipime completed 01/17/2016  Appears to have resolved, WBCs 7.6 on 5/8  Labs ordered for Monday 9. Hypertension/PAF.    Amiodarone 200 mg daily  Coreg 6.25 mg twice a day, increased to 12.5 on 5/5, increased to 25 on 5/8  Improved overall  Will cont to monitor and consider further adjustments if necessary 10. Chronic combined systolic and diastolic congestive heart failure. No signs of fluid overload. Weigh patient daily  Weight   107 on 5/13 -   clinically stable 11. Acute renal insufficiency/presumably ischemic ATN. Renal ultrasound unremarkable.   Cr 1.42 on 5/11 (improving)  Labs ordered for Monday 12. Hyperlipidemia. Lipitor 13. ABLA  Hb 9.4 on 5/8  Labs ordered for Monday 14. ?Wagner Grade 1 ulcer vs. Trauma (likely latter)  Distal first digit of left lower extremity, improving  Continue to monitor for increased erythema, edema, tenderness  LOS (Days) 12 A FACE TO FACE EVALUATION WAS PERFORMED  Fauna Neuner T 01/29/2016 8:29 AM

## 2016-01-29 NOTE — Progress Notes (Signed)
Physical Therapy Session Note  Patient Details  Name: Thomas Wright MRN: [id number] Date of Birth: Apr 08, 1946  Today's Date: 01/29/2016 PT Individual Time: 8978-4784 PT Individual Time Calculation (min): 28 min   Short Term Goals: Week 2:  PT Short Term Goal 1 (Week 2): STG=LTG due to estimated D/C date.   Skilled Therapeutic Interventions/Progress Updates:    Pt received in bed & agreeable to PT, denying c/o pain. Pt reported he had met with members of amputee support group on this date. Pt also notes that he has a friend flying in from [place] to stay with him for 2 months beginning on the 24th but does not have assistance between d/c and that date, only someone that may can stay with him overnight on the first night home. Encouraged pt to have someone stay with him, as well as importance of keeping his phone on his person at all times in case of emergency. Pt reporting he's looking into getting life alert button. Pt also notes that concrete sidewalk will be poured on Friday (day of d/c) and discussed potential need for EMS transport home if concrete has not dried. Pt does state that ramp will be built by Thursday, but not in time for home visit on Tuesday. Pt transferred to sitting EOB with Mod I & use of hospital bed features. While sitting EOB pt performed ball taps while holding 5# bar with pt only able to tolerate exercise for ~1 minute or so at a time 2/2 fatigue, but able to tolerate 3 bouts of activity. At end of session pt returned to supine in bed with mod I & left with all needs within reach.   Therapy Documentation Precautions:  Precautions Precautions: Fall Restrictions Weight Bearing Restrictions: Yes RLE Weight Bearing: Non weight bearing  Pain: Pain Assessment Pain Assessment: No/denies pain   See Function Navigator for Current Functional Status.   Therapy/Group: Individual Therapy  [name] 01/29/2016, 2:23 PM

## 2016-01-30 ENCOUNTER — Inpatient Hospital Stay (HOSPITAL_COMMUNITY): Payer: Commercial Managed Care - HMO

## 2016-01-30 ENCOUNTER — Ambulatory Visit: Payer: Commercial Managed Care - HMO

## 2016-01-30 ENCOUNTER — Inpatient Hospital Stay (HOSPITAL_COMMUNITY): Payer: Commercial Managed Care - HMO | Admitting: Physical Therapy

## 2016-01-30 DIAGNOSIS — R7309 Other abnormal glucose: Secondary | ICD-10-CM | POA: Insufficient documentation

## 2016-01-30 LAB — CBC WITH DIFFERENTIAL/PLATELET
BASOS ABS: 0 10*3/uL (ref 0.0–0.1)
BASOS PCT: 0 %
EOS ABS: 0.2 10*3/uL (ref 0.0–0.7)
Eosinophils Relative: 3 %
HCT: 29.7 % — ABNORMAL LOW (ref 39.0–52.0)
HEMOGLOBIN: 9.3 g/dL — AB (ref 13.0–17.0)
Lymphocytes Relative: 24 %
Lymphs Abs: 1.5 10*3/uL (ref 0.7–4.0)
MCH: 27.2 pg (ref 26.0–34.0)
MCHC: 31.3 g/dL (ref 30.0–36.0)
MCV: 86.8 fL (ref 78.0–100.0)
MONOS PCT: 13 %
Monocytes Absolute: 0.8 10*3/uL (ref 0.1–1.0)
NEUTROS PCT: 60 %
Neutro Abs: 3.7 10*3/uL (ref 1.7–7.7)
Platelets: 320 10*3/uL (ref 150–400)
RBC: 3.42 MIL/uL — ABNORMAL LOW (ref 4.22–5.81)
RDW: 15.1 % (ref 11.5–15.5)
WBC: 6.2 10*3/uL (ref 4.0–10.5)

## 2016-01-30 LAB — GLUCOSE, CAPILLARY
GLUCOSE-CAPILLARY: 201 mg/dL — AB (ref 65–99)
GLUCOSE-CAPILLARY: 167 mg/dL — AB (ref 65–99)
GLUCOSE-CAPILLARY: 158 mg/dL — AB (ref 65–99)
Glucose-Capillary: 211 mg/dL — ABNORMAL HIGH (ref 65–99)

## 2016-01-30 LAB — BASIC METABOLIC PANEL
Anion gap: 9 (ref 5–15)
BUN: 25 mg/dL — ABNORMAL HIGH (ref 6–20)
CALCIUM: 8.4 mg/dL — AB (ref 8.9–10.3)
CO2: 27 mmol/L (ref 22–32)
CREATININE: 1.41 mg/dL — AB (ref 0.61–1.24)
Chloride: 100 mmol/L — ABNORMAL LOW (ref 101–111)
GFR, EST AFRICAN AMERICAN: 58 mL/min — AB (ref >60)
GFR, EST NON AFRICAN AMERICAN: 50 mL/min — AB (ref >60)
Glucose, Bld: 192 mg/dL — ABNORMAL HIGH (ref 65–99)
Potassium: 5.3 mmol/L — ABNORMAL HIGH (ref 3.5–5.1)
SODIUM: 136 mmol/L (ref 135–145)

## 2016-01-30 NOTE — Progress Notes (Signed)
Orthopedic Tech Progress Note Patient Details:  Thomas Wright [birth year] [id number]  Patient ID: Thomas Wright, male   DOB: April 19, 1946, 70 y.o.   MRN: [id number]   Thomas Wright 01/30/2016, 10:46 AMCalled [name] for right stump shrinker.

## 2016-01-30 NOTE — Progress Notes (Addendum)
[place] PHYSICAL MEDICINE & REHABILITATION     PROGRESS NOTE  Subjective/Complaints:  Patient sitting up in bed this morning. He notes that she continues to have some issues with labile blood sugars as well as anxiety overnight.   ROS: Denies phantom limb pain, CP, SOB, N/V/D.   Objective: Vital Signs: Blood pressure 162/69, pulse 67, temperature 98 F (36.7 C), temperature source Oral, resp. rate 18, height 6\' 1"  (1.854 m), weight 108.1 kg (238 lb 5.1 oz), SpO2 97 %. No results found. No results for input(s): WBC, HGB, HCT, PLT in the last 72 hours. No results for input(s): NA, K, CL, GLUCOSE, BUN, CREATININE, CALCIUM in the last 72 hours.  Invalid input(s): CO CBG (last 3)   Recent Labs  01/29/16 2031 01/29/16 2146 01/30/16 0642  GLUCAP 206* 227* 201*    Wt Readings from Last 3 Encounters:  01/30/16 108.1 kg (238 lb 5.1 oz)  01/17/16 111.721 kg (246 lb 4.8 oz)  01/04/16 106.595 kg (235 lb)    Physical Exam:  BP 162/69 mmHg  Pulse 67  Temp(Src) 98 F (36.7 C) (Oral)  Resp 18  Ht 6\' 1"  (1.854 m)  Wt 108.1 kg (238 lb 5.1 oz)  BMI 31.45 kg/m2  SpO2 97% Constitutional: He appears well-developed. Obese. NAD HENT: Normocephalic and atraumatic.  Eyes: Conjunctivae and EOM are normal.  Cardiovascular: Normal rate and regular rhythm. no murmurs  Respiratory: Effort normal and breath sounds normal. No respiratory distress. No wheezes or rales. GI: Soft. Bowel sounds are normal. He exhibits no distension.  Musculoskeletal: He exhibits Minimal tenderness. He exhibits no edema.  Neurological: He is alert and oriented.  Motor: Bilateral upper extremity 5/5 proximal to distal Left lower extremity hip flexion, knee extension 5/5, ankle dorsi/plantar flexion 5/5 Right lower extremity hip flexion, Knee extension: 5/5  Skin:  BKA with staples/dry/intact. First digit left lower extremity with mild erythema (improving) (no edema, no tenderness)  Psychiatric: He has a normal  mood and affect. His behavior is normal  Assessment/Plan: 1. Functional deficits secondary to right BKA which require 3+ hours per day of interdisciplinary therapy in a comprehensive inpatient rehab setting. Physiatrist is providing close team supervision and 24 hour management of active medical problems listed below. Physiatrist and rehab team continue to assess barriers to discharge/monitor patient progress toward functional and medical goals.  Function:  Bathing Bathing position Bathing activity did not occur: Refused Position: Wheelchair/chair at sink  Bathing parts Body parts bathed by patient: Right arm, Left arm, Chest, Abdomen, Front perineal area, Right upper leg, Left upper leg, Buttocks Body parts bathed by helper: Back  Bathing assist Assist Level: Supervision or verbal cues   Set up : To obtain items  Upper Body Dressing/Undressing Upper body dressing Upper body dressing/undressing activity did not occur: Refused What is the patient wearing?: [place] over shirt/dress - Perfomed by patient: Thread/unthread right sleeve, Thread/unthread left sleeve, Put head through opening, Pull shirt over trunk          Upper body assist Assist Level: More than reasonable time   Set up : To obtain clothing/put away  Lower Body Dressing/Undressing Lower body dressing Lower body dressing/undressing activity did not occur: Refused What is the patient wearing?: Pants     Pants- Performed by patient: Thread/unthread right pants leg, Thread/unthread left pants leg, Pull pants up/down Pants- Performed by helper: Pull pants up/down Non-skid slipper socks- Performed by patient: Don/doff left sock   Socks - Performed  by patient: Don/doff left sock (R n/a) Socks - Performed by helper: Don/doff left sock   Shoes - Performed by helper: Don/doff left shoe, Fasten left       TED Hose - Performed by helper: Don/doff left TED hose  Lower body assist Assist for lower body  dressing: Touching or steadying assistance (Pt > 75%)      Toileting Toileting   Toileting steps completed by patient: Adjust clothing prior to toileting, Performs perineal hygiene Toileting steps completed by helper: Adjust clothing prior to toileting, Performs perineal hygiene, Adjust clothing after toileting    Toileting assist Assist level: Touching or steadying assistance (Pt.75%)   Transfers Chair/bed transfer Chair/bed transfer activity did not occur: Refused Chair/bed transfer method: Lateral scoot Chair/bed transfer assist level: Supervision or verbal cues Chair/bed transfer assistive device: Armrests, Sliding board     Locomotion Ambulation Ambulation activity did not occur: Safety/medical concerns   Max distance: 25ft Assist level: Moderate assist (Pt 50 - 74%)   Wheelchair   Type: Manual Max wheelchair distance: 150 Assist Level: No help, No cues, assistive device, takes more than reasonable amount of time  Cognition Comprehension Comprehension assist level: Follows complex conversation/direction with extra time/assistive device  Expression Expression assist level: Expresses complex ideas: With no assist  Social Interaction Social Interaction assist level: Interacts appropriately with others - No medications needed.  Problem Solving Problem solving assist level: Solves complex problems: With extra time  Memory Memory assist level: More than reasonable amount of time    Medical Problem List and Plan: 1. Limited mobility secondary to right below-knee amputation 01/13/2016 secondary to gangrenous changes/multi-medical  Continue CIR therapies  Stump shrinker ordered 2. DVT Prophylaxis/Anticoagulation: Continue Eliquis as prior to admission. 3. Pain Management: Hydrocodone and Robaxin as needed. Monitor with increased mobility 4. Diabetes mellitus with peripheral neuropathy. Hemoglobin A1c 12.6.   Per patient range to stay between 140-160, He states he was on Lantus  25 units daily at bedtime, NovoLog 3 units twice a day with breakfast and lunch, NovoLog 6 units with supper  Lantus insulin 27u qhs.   Added am lantus 5u on 5/14   reduced novolog to 3u tid w/ meals  Due to recent changes will not make any changes today  Check blood sugars before meals and at bedtime.   5. Neuropsych: This patient is capable of making decisions on his own behalf. 6. Skin/Wound Care: Routine skin checks.   -wound examined and re-dressed. Continue current care 7. Fluids/Electrolytes/Nutrition: Routine I&O   Mild hyponatremia: Resolved on 5/11 8.Leukocytosis. Slowly improving. Maxipime completed 01/17/2016  Appears to have resolved, WBCs 7.6 on 5/8  Labs pending for Monday 9. Hypertension/PAF.    Amiodarone 200 mg daily  Coreg 6.25 mg twice a day, increased to 12.5 on 5/5, increased to 25 on 5/8  Improved overall, however elevated in last 12 hours  Will cont to monitor and consider further adjustments if persistently elevated 10. Chronic combined systolic and diastolic congestive heart failure. No signs of fluid overload. Weigh patient daily  Weight   108 on 5/15 -   clinically stable  Will order chest x-ray due to unreliable recordings 11. Acute renal insufficiency/presumably ischemic ATN. Renal ultrasound unremarkable.   Cr 1.42 on 5/11 (improving)  Labs pending for Monday 12. Hyperlipidemia. Lipitor 13. ABLA  Hb 9.4 on 5/8  Labs pending for Monday 14. ?Wagner Grade 1 ulcer vs. Trauma (likely latter)  Distal first digit of left lower extremity, improving  Continue to monitor for  increased erythema, edema, tenderness  LOS (Days) 13 A FACE TO FACE EVALUATION WAS PERFORMED  Ankit Lorie Phenix 01/30/2016 9:30 AM

## 2016-01-30 NOTE — Progress Notes (Signed)
Physical Therapy Session Note  Patient Details  Name: Thomas Wright MRN: [id number] Date of Birth: November 14, 1945  Today's Date: 01/30/2016 PT Individual Time: 1415-1510 AND 1100-1200 PT Individual Time Calculation (min): 55 min AND 60 min   Short Term Goals: Week 2:  PT Short Term Goal 1 (Week 2): STG=LTG due to estimated D/C date.   Skilled Therapeutic Interventions/Progress Updates:    Session 1 Patient received supine in bed. Patient performed bed mobility without cues from PT, but minor use of bed rails. Patinet performed Sb transfer to [facility] from bed with supervision A from PT. Patient able to don shoes with assist from PT while sitting in [facility] WC training performed for 152ft, 141ft, 159ft on level surface, without cues from PT, but notes increased time and effort with increased distance. Patient instructed in [facility] training on handicap access ramp with supervision A-mod I level for 127ft with 1 rest break. Min cues for improved forward weight shift/lean for improved force production and increased mechanical advantage.    Programmer, applications present during treatment for 10 minutes to fit patient for RLE residual limb shrinker.   Sit<>stand transfer training from [facility] with mod A from PT x 4. Mod cues from PT for improved forward weight shift and increased chest expansion to improve weight bearing over LLE.  Patient returned to room and left sitting in [facility] in room at end of session.   Session 2 Patient received sitting in WC and agreeable to PT. Patient performed WC mobility on level surface for 196ft with 1 rest break due to UE fatigue. PT instructed  In WC mobility through 29 inch door and 36 inch hall Using BUE and LLE for steering and propulsion. PT provided supervision A with mod cues for improved use of LE for steering and proper angle of approach to allow access to room.   Patient transported to room in [facility] with total A. PT instructed patient in Toilet transfer for [facility] with supervision A and use of SB.  Patient required min A to doff pants. Patient performed self hygiene in supervision A and mod A required to complete cleaning.Patinet performed Squat pivot transfer to bed with min A from PT, and min cues for UE positioning. Patient donned pants in bed without cues from PT. SB transfer back to [facility] to performed hand hygiene at sink, sitting in WC.   Patient left sitting in WC, with trade off to NT.   Therapy Documentation Precautions:  Precautions Precautions: Fall Restrictions Weight Bearing Restrictions: Yes RLE Weight Bearing: Non weight bearing General:   Vital Signs: Therapy Vitals Temp: 98.6 F (37 C) Temp Source: Oral Pulse Rate: 67 Resp: 18 BP: (!) 140/56 mmHg Patient Position (if appropriate): Sitting Oxygen Therapy SpO2: 95 % O2 Device: Not Delivered   See Function Navigator for Current Functional Status.   Therapy/Group: Individual Therapy  Lorie Phenix 01/30/2016, 4:05 PM

## 2016-01-30 NOTE — Progress Notes (Signed)
Occupational Therapy Session Note  Patient Details  Name: Thomas Wright MRN: [id number] Date of Birth: 08-29-1946  Today's Date: 01/30/2016 OT Individual Time: [id number] OT Individual Time Calculation (min): 75 min    Short Term Goals: Week 2:  OT Short Term Goal 1 (Week 2): STG=LTG due to LOS  Skilled Therapeutic Interventions/Progress Updates: ADL-retraining at shower level with focus on strand-pivot transfer, discharge planning, adapted bathing/dressing skills.   Pt received supine in bed with breakfast completed.   Pt uses bed rails to roll to his left and performs stand-pivot transfer to w/c using RW and steadying assist with extra time. Pt now asserts that he may his doorways may be wide enough to allow access to his bedroom and bathroom.   Although anticipating bed level BADL, pt endorsed his plan to resume showering in bathroom, if feasible.   With only setup and steadying assist to hold w/c, pt completes transfer to tub bench and bathes seated on tub bench.   Pt dries partially while on bench and transfer back to his w/c with steadying asssit.   Pt grooms seated at sink and recovers to his bed to dress lower body while supine, using pelvic bridging to pull up pants.    Pt is provided written HEP for BUE strengthening at end of session with plan for instruction in HEP during next session.     Therapy Documentation Precautions:  Precautions Precautions: Fall Restrictions Weight Bearing Restrictions: Yes RLE Weight Bearing: Non weight bearing  Pain: No/denies pain    See Function Navigator for Current Functional Status.   Therapy/Group: Individual Therapy   Second session: Time: [id number] Time Calculation (min):  30 min  Pain Assessment: No/denies pain  Skilled Therapeutic Interventions: Therapeutic exercises with focus on improved shoulder girdle strength, BUE strengthening (HEP) and discharge planning.   Pt performed 5 min of arm ergometry without resting and proceeded to  perform 7 of 7 UE exercises seated with hand-over-hand guidance required to learn each exercises as follows:  Chest press, seated row, scapular chest pull, scapular pull down, biceps curl, triceps push down, triceps forward press.    Written HEP provided to perform 2-3 times each week, 10-15 reps, using level 3 band (green).       See FIM for current functional status  Therapy/Group: Individual Therapy  [place] 01/30/2016, 10:07 AM

## 2016-01-31 ENCOUNTER — Inpatient Hospital Stay (HOSPITAL_COMMUNITY): Payer: Commercial Managed Care - HMO | Admitting: Physical Therapy

## 2016-01-31 ENCOUNTER — Inpatient Hospital Stay (HOSPITAL_COMMUNITY): Payer: Commercial Managed Care - HMO

## 2016-01-31 ENCOUNTER — Inpatient Hospital Stay (HOSPITAL_COMMUNITY): Payer: Commercial Managed Care - HMO | Admitting: Occupational Therapy

## 2016-01-31 DIAGNOSIS — E8770 Fluid overload, unspecified: Secondary | ICD-10-CM

## 2016-01-31 DIAGNOSIS — R609 Edema, unspecified: Secondary | ICD-10-CM | POA: Insufficient documentation

## 2016-01-31 LAB — GLUCOSE, CAPILLARY
GLUCOSE-CAPILLARY: 180 mg/dL — AB (ref 65–99)
GLUCOSE-CAPILLARY: 147 mg/dL — AB (ref 65–99)
GLUCOSE-CAPILLARY: 127 mg/dL — AB (ref 65–99)
GLUCOSE-CAPILLARY: 100 mg/dL — AB (ref 65–99)
GLUCOSE-CAPILLARY: 168 mg/dL — AB (ref 65–99)

## 2016-01-31 MED ORDER — FUROSEMIDE 40 MG PO TABS
40.0000 mg | ORAL_TABLET | Freq: Every day | ORAL | Status: DC
  Administered 2016-01-31 – 2016-02-03 (×4): 40 mg via ORAL
  Filled 2016-01-31 (×4): qty 1

## 2016-01-31 NOTE — Progress Notes (Signed)
[facility] PHYSICAL MEDICINE & REHABILITATION     PROGRESS NOTE  Subjective/Complaints:  Patient sitting up in bed this morning his nasal cannula on. He said he was feeling short of breath, but now feels better. When his O2 sats were checked they were 97%.  ROS: Denies phantom limb pain, CP, SOB, N/V/D.   Objective: Vital Signs: Blood pressure 174/69, pulse 67, temperature 98.1 F (36.7 C), temperature source Oral, resp. rate 21, height 6\' 1"  (1.854 m), weight 108.3 kg (238 lb 12.1 oz), SpO2 95 %. Dg Chest 1 View  01/30/2016  CLINICAL DATA:  Shortness of breath, fluid retention EXAM: CHEST 1 VIEW COMPARISON:  01/09/2016 FINDINGS: Cardiomegaly with mild interstitial edema. Suspected small left pleural effusion. No pneumothorax. Median sternotomy. IMPRESSION: Cardiomegaly with mild interstitial edema and suspected small left pleural effusion. Electronically Signed   By: Julian Hy M.D.   On: 01/30/2016 09:59    Recent Labs  01/30/16 1056  WBC 6.2  HGB 9.3*  HCT 29.7*  PLT 320    Recent Labs  01/30/16 1056  NA 136  K 5.3*  CL 100*  GLUCOSE 192*  BUN 25*  CREATININE 1.41*  CALCIUM 8.4*   CBG (last 3)   Recent Labs  01/30/16 2103 01/31/16 0206 01/31/16 0630  GLUCAP 211* 180* 147*    Wt Readings from Last 3 Encounters:  01/31/16 108.3 kg (238 lb 12.1 oz)  01/17/16 111.721 kg (246 lb 4.8 oz)  01/04/16 106.595 kg (235 lb)    Physical Exam:  BP 174/69 mmHg  Pulse 67  Temp(Src) 98.1 F (36.7 C) (Oral)  Resp 21  Ht 6\' 1"  (1.854 m)  Wt 108.3 kg (238 lb 12.1 oz)  BMI 31.51 kg/m2  SpO2 95% Constitutional: He appears well-developed. Obese. NAD HENT: Normocephalic and atraumatic.  Eyes: Conjunctivae and EOM are normal.  Cardiovascular: Normal rate and regular rhythm. no murmurs  Respiratory: Effort normal and breath sounds normal. No respiratory distress. No wheezes or rales. GI: Soft. Bowel sounds are normal. He exhibits no distension.  Musculoskeletal:  He exhibits No tenderness. He exhibits no edema.  Neurological: He is alert and oriented.  Motor: Bilateral upper extremity 5/5 proximal to distal Left lower extremity hip flexion, knee extension 5/5, ankle dorsi/plantar flexion 5/5 Right lower extremity hip flexion, Knee extension: 5/5  Skin:  BKA with staples/dry/intact. First digit left lower extremity with mild erythema (almost resolved) (no edema, no tenderness)  Psychiatric: He has a normal mood and affect. His behavior is normal  Assessment/Plan: 1. Functional deficits secondary to right BKA which require 3+ hours per day of interdisciplinary therapy in a comprehensive inpatient rehab setting. Physiatrist is providing close team supervision and 24 hour management of active medical problems listed below. Physiatrist and rehab team continue to assess barriers to discharge/monitor patient progress toward functional and medical goals.  Function:  Bathing Bathing position Bathing activity did not occur: Refused Position: Shower  Bathing parts Body parts bathed by patient: Right arm, Left arm, Chest, Abdomen, Front perineal area, Right upper leg, Left upper leg, Buttocks, Left lower leg Body parts bathed by helper: Back  Bathing assist Assist Level: Supervision or verbal cues   Set up : To obtain items  Upper Body Dressing/Undressing Upper body dressing Upper body dressing/undressing activity did not occur: Refused What is the patient wearing?: Pull over shirt/dress     Pull over shirt/dress - Perfomed by patient: Thread/unthread right sleeve, Thread/unthread left sleeve, Put head through opening, Pull shirt over trunk  Upper body assist Assist Level: More than reasonable time   Set up : To obtain clothing/put away  Lower Body Dressing/Undressing Lower body dressing Lower body dressing/undressing activity did not occur: Refused What is the patient wearing?: Pants     Pants- Performed by patient: Thread/unthread  right pants leg, Thread/unthread left pants leg, Pull pants up/down Pants- Performed by helper: Pull pants up/down Non-skid slipper socks- Performed by patient: Don/doff left sock   Socks - Performed by patient: Don/doff left sock (R n/a) Socks - Performed by helper: Don/doff left sock   Shoes - Performed by helper: Don/doff left shoe, Fasten left       TED Hose - Performed by helper: Don/doff left TED hose  Lower body assist Assist for lower body dressing: Touching or steadying assistance (Pt > 75%), More than reasonable time      Toileting Toileting   Toileting steps completed by patient: Adjust clothing prior to toileting, Performs perineal hygiene Toileting steps completed by helper: Adjust clothing prior to toileting, Performs perineal hygiene, Adjust clothing after toileting    Toileting assist Assist level: Touching or steadying assistance (Pt.75%)   Transfers Chair/bed transfer Chair/bed transfer activity did not occur: Refused Chair/bed transfer method: Lateral scoot Chair/bed transfer assist level: Supervision or verbal cues Chair/bed transfer assistive device: Sliding board, Armrests     Locomotion Ambulation Ambulation activity did not occur: Safety/medical concerns   Max distance: 70ft Assist level: Moderate assist (Pt 50 - 74%)   Wheelchair   Type: Manual Max wheelchair distance: 175 Assist Level: Supervision or verbal cues  Cognition Comprehension Comprehension assist level: Follows complex conversation/direction with extra time/assistive device  Expression Expression assist level: Expresses complex ideas: With extra time/assistive device  Social Interaction Social Interaction assist level: Interacts appropriately with others with medication or extra time (anti-anxiety, antidepressant).  Problem Solving Problem solving assist level: Solves complex 90% of the time/cues < 10% of the time  Memory Memory assist level: More than reasonable amount of time     Medical Problem List and Plan: 1. Limited mobility secondary to right below-knee amputation 01/13/2016 secondary to gangrenous changes/multi-medical  Continue CIR therapies  Stump shrinker ordered 2. DVT Prophylaxis/Anticoagulation: Continue Eliquis as prior to admission. 3. Pain Management: Hydrocodone and Robaxin as needed. Monitor with increased mobility 4. Diabetes mellitus with peripheral neuropathy. Hemoglobin A1c 12.6.   Per patient range to stay between 140-160, He states he was on Lantus 25 units daily at bedtime, NovoLog 3 units twice a day with breakfast and lunch, NovoLog 6 units with supper  Lantus insulin 27u qhs.   Added am lantus 5u on 5/14   reduced novolog to 3u tid w/ meals  Relatively improved per the patient's standards  Check blood sugars before meals and at bedtime.   5. Neuropsych: This patient is capable of making decisions on his own behalf. 6. Skin/Wound Care: Routine skin checks.   -wound examined and re-dressed. Continue current care 7. Fluids/Electrolytes/Nutrition: Routine I&O   Mild hyponatremia: Resolved on 5/11 8.Leukocytosis. Slowly improving. Maxipime completed 01/17/2016  Resolved, WBCs 6.2 on 5/15 9. Hypertension/PAF.    Amiodarone 200 mg daily  Coreg 6.25 mg twice a day, increased to 12.5 on 5/5, increased to 25 on 5/8  Likely exacerbated by anxiety  Will cont to monitor and consider further adjustments if persistently elevated 10. Chronic combined systolic and diastolic congestive heart failure.   Weigh patient daily  Weight   108 on 5/16 -   clinically stable  Chest x-ray from  5/15 reviewed,  suggesting interstitial edema mild left pleural effusion  Lasix 40 mg daily started on 5/16, will transition to 20 mg daily after a couple of days 11. Acute renal insufficiency/presumably ischemic ATN. Renal ultrasound unremarkable.   Cr 1.41 on 5/15 (improving) 12. Hyperlipidemia. Lipitor 13. ABLA  Hb 9.3 on 5/15  Labs pending for Monday 14.  Trauma to left great toe  Distal first digit of left lower extremity with minimal erythema, improving  Continue to monitor for increased erythema, edema, tenderness  LOS (Days) 14 A FACE TO FACE EVALUATION WAS PERFORMED  Ankit Lorie Phenix 01/31/2016 8:50 AM

## 2016-01-31 NOTE — Progress Notes (Signed)
Occupational Therapy Session Note  Patient Details  Name: Thomas Wright MRN: [id number] Date of Birth: Feb 17, 1946  Today's Date: 01/31/2016 OT Individual Time: 1115-1200 OT Individual Time Calculation (min): 45 min    Short Term Goals: Week 2:  OT Short Term Goal 1 (Week 2): STG=LTG due to LOS  Skilled Therapeutic Interventions/Progress Updates: Therapeutic exercises with focus on improved endurance, BUE strengthening, shoulder/upper back strengthening.   Pt completed 7 min using Sci-Fit, level 1 resistance, random routine.   Pt sustained 30-40 rpm during 7 min and required no rest break.   Pt advised to attempt 10 min during next session.   Pt was then escorted to ortho gym where he completed 10 reps, 3 sets of shoulder pull-downs and horizontal rowing (15 lbs of resistance).   Pt completed therex with good effort and awareness of need for shoulder/back/BUE strengthening to improve standing balance and transfers using RW.     Therapy Documentation Precautions:  Precautions Precautions: Fall Restrictions Weight Bearing Restrictions: Yes RLE Weight Bearing: Non weight bearing  Pain: Pain Assessment Pain Assessment: 0-10 Pain Score: 5  Pain Type: Surgical pain Pain Location: Leg Pain Orientation: Right Pain Descriptors / Indicators: Aching Pain Onset: With Activity Pain Intervention(s): Medication (See eMAR)  Exercises: Cardiovascular Exercises Upper Body Ergometer: 7 min, level 1, random routine, 30-40 rpm w/o rest break  See Function Navigator for Current Functional Status.   Therapy/Group: Individual Therapy  [place] 01/31/2016, 12:09 PM

## 2016-01-31 NOTE — Progress Notes (Signed)
Physical Therapy Session Note  Patient Details  Name: Thomas Wright MRN: [id number] Date of Birth: May 15, 1946  Today's Date: 01/31/2016 PT Individual Time: 0802-0900 PT Individual Time Calculation (min): 58 min   Short Term Goals: Week 2:  PT Short Term Goal 1 (Week 2): STG=LTG due to estimated D/C date.   Skilled Therapeutic Interventions/Progress Updates:    Pt received in bed & agreeable to PT, noting stomach feeling "queasy" but denying pain. Pt wanting to finish eating breakfast & while pt did so discussed transfers to/from lift chair at home, as well as need for EMS transport home on Friday due to concrete being poured on Friday & it still being wet upon pt return; pt agreeable. Pt able to transfer to sitting EOB with Mod I & independently don L thigh high ted hose & shoe. Pt c/o significant fatigue & "being out of breath" with very little activity but after rest break pt able to transfer bed>w/c via sliding board with supervision A.Set up recliner & pt able to complete sliding board transfer w/c<>recliner to simulate lift chair/recliner at home. Educated pt to have lift chair positioned level for transfers to prevent him from sliding down/out. Pt self propelled w/c room>rehab apartment with BUE, mod I & rest breaks needed. Pt able to set up w/c for w/c>couch transfer to simulate transfer to/from lift chair at home. Pt transferred w/c>couch with supervision A but required PT assistance to hold position of sliding board & maximum cuing for couch>w/c as pt had great difficulty producing large enough push with BUE to transfer to elevated surface. Pt reported being out of breath after transfer. PT transported pt back to room via w/c total A & left in w/c with all needs within reach at end of session.  Therapy Documentation Precautions:  Precautions Precautions: Fall Restrictions Weight Bearing Restrictions: Yes RLE Weight Bearing: Non weight bearing  Pain: Pain Assessment Pain Assessment:  No/denies pain   See Function Navigator for Current Functional Status.   Therapy/Group: Individual Therapy  [name] 01/31/2016, 8:04 AM

## 2016-01-31 NOTE — Progress Notes (Signed)
Occupational Therapy Session Note  Patient Details  Name: Thomas Wright MRN: [id number] Date of Birth: Mar 17, 1946  Today's Date: 01/31/2016 OT Individual Time: 1300-1345 OT Individual Time Calculation (min): 45 min    Short Term Goals: Week 2:  OT Short Term Goal 1 (Week 2): STG=LTG due to LOS  Skilled Therapeutic Interventions/Progress Updates:    1:1 Transfer training with slide board from w/c<drop arm BSC<bed <drop arm BSC <w/c with slide board with focus on slide board placement, activity tolerance/ endurance/ and actual transfer. Pt required min VC for proper slide board placement but could perform all 4 transfers with supervision in a reasonable amt of time. Pt able to propel self and transfer without board with steadying A with minimal bottom clearance to mat. At EOB focus on UB activity tolerance and strengthening with all UE, core and back mm groups with 4 lb weighted bar and 3 lb weighted ball with rest breaks in between to continue to assist with transfer training and ability to gain bottom clearance with transfers.  Therapy Documentation Precautions:  Precautions Precautions: Fall Restrictions Weight Bearing Restrictions: Yes RLE Weight Bearing: Non weight bearing  Pain: Pain Assessment Pain Assessment: No/denies pain  See Function Navigator for Current Functional Status.   Therapy/Group: Individual Therapy  Willeen Cass [facility] 01/31/2016, 3:12 PM

## 2016-01-31 NOTE — Progress Notes (Signed)
Physical Therapy Session Note  Patient Details  Name: Thomas Wright MRN: [id number] Date of Birth: 06-Feb-1946  Today's Date: 01/31/2016 PT Individual Time: [id number] PT Individual Time Calculation (min): 40 min   Short Term Goals: Week 2:  PT Short Term Goal 1 (Week 2): STG=LTG due to estimated D/C date.   Skilled Therapeutic Interventions/Progress Updates:    Pt received seated in w/c, denies pain and agreeable to treatment. Requests to use restroom to have a bowel movement, reports he has not had a bowel movement on the real toilet yet and has either been using bedpan or BSC. Squat pivot transfer w/c <>toilet with grab bars and minA. Lateral leans with S and grab bars to don/doff pants and perform hygiene after bowel movement. W/c propulsion x200' with BUE and increased time due to UE strength deficits. Sit <>stand x5 reps in parallel bars with modA. Upon standing, performed alternating UE lifts from parallel bars for balance and stance control LLE, each stand approximately 45 sec-1 min each. Requires extended seated rest breaks between trials due to fatigue. Returned to room totalA due to fatigue; remained seated in w/c at completion of session, all needs within reach.   Therapy Documentation Precautions:  Precautions Precautions: Fall Restrictions Weight Bearing Restrictions: Yes RLE Weight Bearing: Non weight bearing Pain: Pain Assessment Pain Assessment: No/denies pain   See Function Navigator for Current Functional Status.   Therapy/Group: Individual Therapy  [name] 01/31/2016, 12:15 PM

## 2016-02-01 ENCOUNTER — Inpatient Hospital Stay (HOSPITAL_COMMUNITY): Payer: Commercial Managed Care - HMO | Admitting: Occupational Therapy

## 2016-02-01 ENCOUNTER — Inpatient Hospital Stay (HOSPITAL_COMMUNITY): Payer: Commercial Managed Care - HMO | Admitting: Physical Therapy

## 2016-02-01 LAB — BASIC METABOLIC PANEL
ANION GAP: 9 (ref 5–15)
BUN: 22 mg/dL — ABNORMAL HIGH (ref 6–20)
CO2: 28 mmol/L (ref 22–32)
Calcium: 8.7 mg/dL — ABNORMAL LOW (ref 8.9–10.3)
Chloride: 99 mmol/L — ABNORMAL LOW (ref 101–111)
Creatinine, Ser: 1.33 mg/dL — ABNORMAL HIGH (ref 0.61–1.24)
GFR calc Af Amer: >60 mL/min (ref >60)
GFR, EST NON AFRICAN AMERICAN: 53 mL/min — AB (ref >60)
GLUCOSE: 122 mg/dL — AB (ref 65–99)
POTASSIUM: 4.2 mmol/L (ref 3.5–5.1)
SODIUM: 136 mmol/L (ref 135–145)

## 2016-02-01 LAB — GLUCOSE, CAPILLARY
GLUCOSE-CAPILLARY: 133 mg/dL — AB (ref 65–99)
GLUCOSE-CAPILLARY: 134 mg/dL — AB (ref 65–99)
GLUCOSE-CAPILLARY: 109 mg/dL — AB (ref 65–99)
Glucose-Capillary: 171 mg/dL — ABNORMAL HIGH (ref 65–99)

## 2016-02-01 MED ORDER — LISINOPRIL 5 MG PO TABS
5.0000 mg | ORAL_TABLET | Freq: Every day | ORAL | Status: DC
  Administered 2016-02-01: 5 mg via ORAL
  Filled 2016-02-01: qty 1

## 2016-02-01 NOTE — Progress Notes (Signed)
Physical Therapy Session Note  Patient Details  Name: Thomas Wright MRN: [id number] Date of Birth: 17-Jul-1946  Today's Date: 02/01/2016 PT Individual Time: 0803-0903 and 1531-1600 PT Individual Time Calculation (min): 60 min and 29 min  Short Term Goals: Week 2:  PT Short Term Goal 1 (Week 2): STG=LTG due to estimated D/C date.   Skilled Therapeutic Interventions/Progress Updates:    Treatment 1: Pt received in bed & agreeable to PT, denying c/o pain. Pt transferred supine>sit with mod I & use of hospital bed features. Gave pt long sliding board to practice transfers with as pt will d/c home with recommended long sliding board & PT educated pt to perform only sliding board transfers (instead of squat pivot) at home for safety & pt in agreement. Pt able to independently don LLE ted hose & shoe sitting at EOB. Pt transferred bed>w/c with sliding board Mod I & self propelled w/c over tile floor, carpet in day room, and thick carpet rug in Rehab office, to simulate home environment, with Mod i & rest breaks as needed 2/2 BUE strength deficits & fatigue. Returned to room & reviewed BLE HEP; pt performed sitting LAQ, supine straight leg raises, glute sets, isometric knee extension, sidelying R hip extension with PT providing verbal  & tactile cuing for proper technique & not to hold breath while performing exercises.  Educated pt to promote R knee extension to prevent knee flexor muscle tightness & contracture development; pt verbalized understanding. Pt able to demonstrate AROM R knee extension to neutral and tolerated lying prone without c/o pain x 2 minutes. Educated pt to lie on stomach at home 2-3 times each day for at least 5 minutes at a time for passive hip flexor stretch.  At end of session pt left supine in bed with all needs within reach.   Treatment 2: Pt received in bed asleep but awoke upon PT arrival. Pt denying c/o pain & agreeable to PT. Reviewed remainder of pt's HEP to ensure pt  understanding prior to d/c. Pt able to transfer supine>prone with Mod I & performed BLE hip extensions & knee flexions, supine half-sit ups, and knee & buttock presses with therapist providing feedback for proper technique. Pt noted being "winded" after performing half-sit ups as well as need to void & did so with urinal while supine in bed. Pt voiced understanding of HEP and feels as though he can complete exercises at home. At end of session pt left in bed with all needs within reach.    Therapy Documentation Precautions:  Precautions Precautions: Fall Restrictions Weight Bearing Restrictions: Yes RLE Weight Bearing: Non weight bearing  Pain: Pain Assessment Pain Assessment: No/denies pain   See Function Navigator for Current Functional Status.   Therapy/Group: Individual Therapy  [name] 02/01/2016, 7:57 AM

## 2016-02-01 NOTE — Patient Care Conference (Signed)
Inpatient RehabilitationTeam Conference and Plan of Care Update Date: 02/01/2016   Time: 2:00 PM    Patient Name: Thomas Wright      Medical Record Number: [id number]  Date of Birth: 06/27/1946 Sex: Male         Room/Bed: 4W03C/4W03C-01 Payor Info: Payor: [employer] MEDICARE / Plan: [employer] GOLD PLUS HMO THN/NTSP / Product Type: *No Product type* /    Admitting Diagnosis: R Bka  Admit Date/Time:  01/17/2016  4:30 PM Admission Comments: No comment available   Primary Diagnosis:  Status post below knee amputation of right lower extremity (HCC) Principal Problem: Status post below knee amputation of right lower extremity [employer])  Patient Active Problem List   Diagnosis Date Noted  . Fluid retention   . Labile blood glucose   . Toe erythema   . Hypoglycemia associated with type 2 diabetes mellitus ([place])   . Hyponatremia   . Diarrhea 01/17/2016  . Amputation of right lower extremity below knee with complication ([place]) [id number]  . AKI (acute kidney injury) ([place])   . Type 2 diabetes mellitus with peripheral neuropathy (HCC)   . Status post aortic valve replacement   . Chronic combined systolic and diastolic congestive heart failure ([place])   . ATN (acute tubular necrosis) ([place])   . Postoperative pain   . Acute blood loss anemia   . Leukocytosis   . Morbid obesity ([place])   . Status post below knee amputation of right lower extremity ([place])   . Insulin dependent diabetes mellitus ([place]) 01/09/2016  . Diabetic foot infection ([place]) 01/09/2016  . Sepsis, unspecified organism ([place]) 01/09/2016  . PAF (paroxysmal atrial fibrillation) ([place]) 04/22/2015  . Essential hypertension 04/22/2015  . Obesity 04/22/2015  . Dyspnea 04/20/2015  . Acute on chronic diastolic CHF (congestive heart failure) ([place]) 04/19/2015  . Cellulitis of leg 11/23/2014  . Diabetes ([place]) 09/13/2014  . Chronic anticoagulation 09/13/2014  . Atrial flutter, unspecified   . Paroxysmal atrial fibrillation (HCC)   . Perirectal abscess    . Other hemorrhoids   . Tenosynovitis of finger 05/05/2014  . Cellulitis and abscess of digit 05/05/2014  . Back pain   . H/O aortic valve replacement with tissue graft   . Hypertriglyceridemia   . Hypertension     Expected Discharge Date: Expected Discharge Date: 02/03/16  Team Members Present: Physician leading conference: Dr. Delice Lesch Social Worker Present: Ovidio Kin, LCSW Nurse Present: Dorien Chihuahua, RN PT Present: Barrie Folk, PT;Victoria Sabra Heck, PT OT Present: Willeen Cass, OT PPS Coordinator present : Daiva Nakayama, RN, CRRN     Current Status/Progress Goal Weekly Team Focus  Medical   Limited mobility secondary to right below-knee amputation 01/13/2016 secondary to gangrenous changes/multi-medical  Improve DM, HTN, CHF, safety, mobility  see above   Bowel/Bladder   Continent of bladder and bowel; LBM 5/16  Remain cont.  Monitor   Swallow/Nutrition/ Hydration             ADL's   Mod I for upper body BADL and grooming, Sup-Min assist for lower at EOB and supine.  Overall Mod I with supervision for homemaking and shower transfers  BUE strengthening, sit<>stand, static standing balance, endurance, discharge planning, AE/DME training.   Mobility   supervision for sliding board transfers, w/c mobility with supervision/mod i, unable to ambulate on LLE  min A standing balance, mod i bed mobility & bed<>chair transfers, supervision for car transfers, ambulate 5 ft in parallel bars, mod I w/c mobility  transfers to simulate lift  recliner, simulate home environment &  home negotiation, standing   Communication             Safety/Cognition/ Behavioral Observations            Pain   No c/o pain  Pain <3  Assess pain q 4hr and prn   Skin   Incision to R stump with staples  No skin breakdown  assess skin q shift and prn    Rehab Goals Patient on target to meet rehab goals: Yes Rehab Goals Revised: none *See Care Plan and progress notes for long and short-term  goals.  Barriers to Discharge: DM, ATN, safety edu, HTN, heart failure    Possible Resolutions to Barriers:  Optimize DM meds, pt edu, therapies, optimize BP meds, follow labs and weights     Discharge Planning/Teaching Needs:  Pt plans to return to his home with landlord building ramp and a friend helping him at home.  Pt's goals are modified independent.  Pt has a friend who can help him transition home.  Friend will likely not make it for family education, but pt can provide this.   Team Discussion:  Progressing toward mod/i wheelchair level goals-working on stand pivot but will not recommend to do at home alone. Ramp being done Friday. Slept better last night-MD reports nothing different done for this to happen. Limited social supports team concerned about discharge home.  Revisions to Treatment Plan:  DC Friday   Continued Need for Acute Rehabilitation Level of Care: The patient requires daily medical management by a physician with specialized training in physical medicine and rehabilitation for the following conditions: Daily direction of a multidisciplinary physical rehabilitation program to ensure safe treatment while eliciting the highest outcome that is of practical value to the patient.: Yes Daily medical management of patient stability for increased activity during participation in an intensive rehabilitation regime.: Yes Daily analysis of laboratory values and/or radiology reports with any subsequent need for medication adjustment of medical intervention for : Post surgical problems;Diabetes problems;Blood pressure problems;Wound care problems  Donne Baley, Silvestre Mesi 02/02/2016, 11:21 AM

## 2016-02-01 NOTE — Progress Notes (Signed)
Physical Therapy Session Note  Patient Details  Name: Thomas Wright MRN: [id number] Date of Birth: 10/17/1945  Today's Date: 02/01/2016 PT Individual Time: 0915-1015 PT Individual Time Calculation (min): 60 min   Short Term Goals: Week 1:  PT Short Term Goal 1 (Week 1): Patient will performed Sb transfer with min A.  PT Short Term Goal 1 - Progress (Week 1): Met PT Short Term Goal 2 (Week 1): Patient will perform squat pivot transfer with mod A.  PT Short Term Goal 2 - Progress (Week 1): Met PT Short Term Goal 3 (Week 1): Patient will perform sit<>stand from [facility] with RW with mod A.  PT Short Term Goal 3 - Progress (Week 1): Met PT Short Term Goal 4 (Week 1): Patient will perform bed mobility with supervision A.  PT Short Term Goal 4 - Progress (Week 1): Met PT Short Term Goal 5 (Week 1): Patient will propell WC 150 with supervision A and min verbal instruction.  PT Short Term Goal 5 - Progress (Week 1): Met Week 2:  PT Short Term Goal 1 (Week 2): STG=LTG due to estimated D/C date.   Skilled Therapeutic Interventions/Progress Updates:    Patient received supine in bed and agreeable to PT. Patient performed bed mobility for supine to sit without Cues from PT with slight Head of Bed inclination.  Throughout treatment patient performed SB transfer to and from [name] x 8 with supervision progressing to Mod I level. From various heights. Patient also performed car transfer with supervision A with min cues for proper SB positioning and LE placement prior to transfer back to [name].   Sit stand x 5 throughout treatment with min A from PT and mod-max cues for weight shift, UE placement, and proper sequencing to maintain balance and improved mechanical advantage..   WC mobility for 126f, 1250f 7552fnd 100f20fthout cues  From PT, but needing increased time and effort with increased distance. Patient also performed WC mobillity through door frame of 29.5 inches x 2 to simulate access to bedroom at home with  supervision A and min cues for WC parts management and use of LLE for steering through door.   Stand pivot transfer with RW, and mod A PT as well as mod cues for improved push through BLE to allow WB through toes to perform pivot motion.   Patient returned to room and [facility] reach.      Therapy Documentation Precautions:  Precautions Precautions: Fall Restrictions Weight Bearing Restrictions: Yes RLE Weight Bearing: Non weight bearing   Pain: Pain Assessment Pain Assessment: No/denies pain Pain Score: 0-No pain   See Function Navigator for Current Functional Status.   Therapy/Group: Individual Therapy  AustLorie Phenix7/2017, 12:47 PM

## 2016-02-01 NOTE — Progress Notes (Signed)
[place] PHYSICAL MEDICINE & REHABILITATION     PROGRESS NOTE  Subjective/Complaints:  Pt sitting up in bed.  He states he had a much better night last night.    ROS: Denies phantom limb pain, CP, SOB, N/V/D.   Objective: Vital Signs: Blood pressure 161/66, pulse 65, temperature 97.8 F (36.6 C), temperature source Oral, resp. rate 17, height 6\' 1"  (1.854 m), weight 107.2 kg (236 lb 5.3 oz), SpO2 95 %. Dg Chest 1 View  01/30/2016  CLINICAL DATA:  Shortness of breath, fluid retention EXAM: CHEST 1 VIEW COMPARISON:  01/09/2016 FINDINGS: Cardiomegaly with mild interstitial edema. Suspected small left pleural effusion. No pneumothorax. Median sternotomy. IMPRESSION: Cardiomegaly with mild interstitial edema and suspected small left pleural effusion. Electronically Signed   By: Julian Hy M.D.   On: 01/30/2016 09:59    Recent Labs  01/30/16 1056  WBC 6.2  HGB 9.3*  HCT 29.7*  PLT 320    Recent Labs  01/30/16 1056  NA 136  K 5.3*  CL 100*  GLUCOSE 192*  BUN 25*  CREATININE 1.41*  CALCIUM 8.4*   CBG (last 3)   Recent Labs  01/31/16 1648 01/31/16 2155 02/01/16 0630  GLUCAP 100* 168* 133*    Wt Readings from Last 3 Encounters:  02/01/16 107.2 kg (236 lb 5.3 oz)  01/17/16 111.721 kg (246 lb 4.8 oz)  01/04/16 106.595 kg (235 lb)    Physical Exam:  BP 161/66 mmHg  Pulse 65  Temp(Src) 97.8 F (36.6 C) (Oral)  Resp 17  Ht 6\' 1"  (1.854 m)  Wt 107.2 kg (236 lb 5.3 oz)  BMI 31.19 kg/m2  SpO2 95% Constitutional: He appears well-developed. Obese. NAD HENT: Normocephalic and atraumatic.  Eyes: Conjunctivae and EOM are normal.  Cardiovascular: Normal rate and regular rhythm. no murmurs  Respiratory: Effort normal and breath sounds normal. No respiratory distress. No wheezes or rales. GI: Soft. Bowel sounds are normal. He exhibits no distension.  Musculoskeletal: He exhibits No tenderness. +1 edema left foot (improving)..  Neurological: He is alert and  oriented.  Motor: Bilateral upper extremity 5/5 proximal to distal Left lower extremity hip flexion, knee extension 5/5, ankle dorsi/plantar flexion 5/5 Right lower extremity hip flexion, Knee extension: 5/5  Skin:  BKA with staples/dry/intact. First digit left lower extremity with minimal erythema (almost resolved) (no edema, no tenderness)  Psychiatric: He has a normal mood and affect. His behavior is normal  Assessment/Plan: 1. Functional deficits secondary to right BKA which require 3+ hours per day of interdisciplinary therapy in a comprehensive inpatient rehab setting. Physiatrist is providing close team supervision and 24 hour management of active medical problems listed below. Physiatrist and rehab team continue to assess barriers to discharge/monitor patient progress toward functional and medical goals.  Function:  Bathing Bathing position Bathing activity did not occur: Refused Position: Shower  Bathing parts Body parts bathed by patient: Right arm, Left arm, Chest, Abdomen, Front perineal area, Right upper leg, Left upper leg, Buttocks, Left lower leg Body parts bathed by helper: Back  Bathing assist Assist Level: Supervision or verbal cues   Set up : To obtain items  Upper Body Dressing/Undressing Upper body dressing Upper body dressing/undressing activity did not occur: Refused What is the patient wearing?: Pull over shirt/dress     Pull over shirt/dress - Perfomed by patient: Thread/unthread right sleeve, Thread/unthread left sleeve, Put head through opening, Pull shirt over trunk          Upper body assist Assist Level:  More than reasonable time   Set up : To obtain clothing/put away  Lower Body Dressing/Undressing Lower body dressing Lower body dressing/undressing activity did not occur: Refused What is the patient wearing?: Pants     Pants- Performed by patient: Thread/unthread right pants leg, Thread/unthread left pants leg, Pull pants up/down Pants-  Performed by helper: Pull pants up/down Non-skid slipper socks- Performed by patient: Don/doff left sock   Socks - Performed by patient: Don/doff left sock (R n/a) Socks - Performed by helper: Don/doff left sock   Shoes - Performed by helper: Don/doff left shoe, Fasten left       TED Hose - Performed by helper: Don/doff left TED hose  Lower body assist Assist for lower body dressing: Touching or steadying assistance (Pt > 75%), More than reasonable time      Toileting Toileting   Toileting steps completed by patient: Adjust clothing prior to toileting, Performs perineal hygiene, Adjust clothing after toileting Toileting steps completed by helper: Adjust clothing prior to toileting, Performs perineal hygiene, Adjust clothing after toileting Toileting Assistive Devices: Grab bar or rail  Toileting assist Assist level: Supervision or verbal cues   Transfers Chair/bed transfer Chair/bed transfer activity did not occur: Refused Chair/bed transfer method: Lateral scoot Chair/bed transfer assist level: Supervision or verbal cues Chair/bed transfer assistive device: Sliding board     Locomotion Ambulation Ambulation activity did not occur: Safety/medical concerns   Max distance: 33ft Assist level: Moderate assist (Pt 50 - 74%)   Wheelchair   Type: Manual Max wheelchair distance: >150 ft Assist Level: No help, No cues, assistive device, takes more than reasonable amount of time  Cognition Comprehension Comprehension assist level: Follows complex conversation/direction with extra time/assistive device  Expression Expression assist level: Expresses complex ideas: With extra time/assistive device  Social Interaction Social Interaction assist level: Interacts appropriately with others with medication or extra time (anti-anxiety, antidepressant).  Problem Solving Problem solving assist level: Solves complex 90% of the time/cues < 10% of the time  Memory Memory assist level: More than  reasonable amount of time    Medical Problem List and Plan: 1. Limited mobility secondary to right below-knee amputation 01/13/2016 secondary to gangrenous changes/multi-medical  Continue CIR therapies  Stump shrinker ordered 2. DVT Prophylaxis/Anticoagulation: Continue Eliquis as prior to admission. 3. Pain Management: Hydrocodone and Robaxin as needed. Monitor with increased mobility 4. Diabetes mellitus with peripheral neuropathy. Hemoglobin A1c 12.6.   Per patient range to stay between 140-160, He states he was on Lantus 25 units daily at bedtime, NovoLog 3 units twice a day with breakfast and lunch, NovoLog 6 units with supper  Lantus insulin 27u qhs.   Added am lantus 5u on 5/14   reduced novolog to 3u tid w/ meals  Relatively improved per the patient's standards and pt asptomatic, will not make further changes at present  Check blood sugars before meals and at bedtime.   5. Neuropsych: This patient is capable of making decisions on his own behalf. 6. Skin/Wound Care: Routine skin checks.   -wound examined and re-dressed. Continue current care 7. Fluids/Electrolytes/Nutrition: Routine I&O   Mild hyponatremia: Resolved on 5/11 8.Leukocytosis. Slowly improving. Maxipime completed 01/17/2016  Resolved, WBCs 6.2 on 5/15 9. Hypertension/PAF.    Amiodarone 200 mg daily  Coreg 6.25 mg twice a day, increased to 12.5 on 5/5, increased to 25 on 5/8  Lisinopril 5mg  qHS started on 5/17  Likely exacerbated by anxiety  Will cont to monitor and consider further adjustments if persistently elevated 10.  Chronic combined systolic and diastolic congestive heart failure.   Weigh patient daily  Weight   107 on 5/17 -   clinically stable  Chest x-ray from 5/15 reviewed,  suggesting interstitial edema mild left pleural effusion  Lasix 40 mg daily started on 5/16, will transition to 20 mg daily after a couple of days 11. Acute renal insufficiency/presumably ischemic ATN. Renal ultrasound  unremarkable.   Cr 1.41 on 5/15 (improving)  Labs ordered for today 12. Hyperlipidemia. Lipitor 13. ABLA  Hb 9.3 on 5/15 14. Trauma to left great toe  Distal first digit of left lower extremity with minimal erythema, improving  Continue to monitor for increased erythema, edema, tenderness  LOS (Days) 15 A FACE TO FACE EVALUATION WAS PERFORMED  Thomas Wright Phenix 02/01/2016 7:49 AM

## 2016-02-01 NOTE — Progress Notes (Signed)
Occupational Therapy Session Note  Patient Details  Name: Thomas Wright MRN: [id number] Date of Birth: [birth year]  Today's Date: 02/01/2016 OT Individual Time: 1100-1200 OT Individual Time Calculation (min): 60 min    Short Term Goals: Week 2:  OT Short Term Goal 1 (Week 2): STG=LTG due to LOS  Skilled Therapeutic Interventions/Progress Updates:    1:1 self care retraining at sink . Pt reports just coming from the bathroom performing peri hygiene and LB clothing dressing and management. Pt able to perform grooming and UB B//D at sink level mod I. Discussed lateral leans for clothing management during toilet and decided a larger BSC with more surface area would be better suited. Recommended for pt not to perform toilet transfers in bathroom at this time until [facility] eval and recommendations for setup. Pt performed transfer to larger BSC from w/c and back to w/c with practicing lateral leans for clothing management during toileting. Pt able to place sliding board with one questioning cue. Continued focus on d/c planning.  Therapy Documentation Precautions:  Precautions Precautions: Fall Restrictions Weight Bearing Restrictions: Yes RLE Weight Bearing: Non weight bearing Pain: Pain Assessment Pain Assessment: No/denies pain Pain Score: 0-No pain  See Function Navigator for Current Functional Status.   Therapy/Group: Individual Therapy  Willeen Cass [facility] 02/01/2016, 11:31 AM

## 2016-02-02 ENCOUNTER — Inpatient Hospital Stay (HOSPITAL_COMMUNITY): Payer: Commercial Managed Care - HMO

## 2016-02-02 ENCOUNTER — Inpatient Hospital Stay (HOSPITAL_COMMUNITY): Payer: Commercial Managed Care - HMO | Admitting: Physical Therapy

## 2016-02-02 ENCOUNTER — Inpatient Hospital Stay (HOSPITAL_COMMUNITY): Payer: Commercial Managed Care - HMO | Admitting: Occupational Therapy

## 2016-02-02 LAB — GLUCOSE, CAPILLARY
GLUCOSE-CAPILLARY: 173 mg/dL — AB (ref 65–99)
GLUCOSE-CAPILLARY: 226 mg/dL — AB (ref 65–99)
Glucose-Capillary: 109 mg/dL — ABNORMAL HIGH (ref 65–99)
Glucose-Capillary: 101 mg/dL — ABNORMAL HIGH (ref 65–99)
Glucose-Capillary: 158 mg/dL — ABNORMAL HIGH (ref 65–99)

## 2016-02-02 MED ORDER — LISINOPRIL 10 MG PO TABS
10.0000 mg | ORAL_TABLET | Freq: Every day | ORAL | Status: DC
  Administered 2016-02-02: 10 mg via ORAL
  Filled 2016-02-02: qty 1

## 2016-02-02 NOTE — Progress Notes (Signed)
Physical Therapy Discharge Summary  Patient Details  Name: Thomas Wright MRN: [id number] Date of Birth: Jun 08, 1946  Today's Date: 02/02/2016 PT Individual Time: 1100-1200 Individual treatment time: 60 min      Patient has met 9 of 9 long term goals due to improved activity tolerance, improved balance, improved postural control, increased strength, increased range of motion, decreased pain and improved awareness.  Patient to discharge at a wheelchair level Modified Independent.   Patient's care partner unavailable to provide the necessary physical assistance at discharge.   Recommendation:  Patient will benefit from ongoing skilled PT services in home health setting to continue to advance safe functional mobility, address ongoing impairments in Strength, balance, mobility, endurance, and minimize fall risk.  Equipment: Wheel chair, WC cushion, Amputee support pad, Slide board,   Reasons for discharge: treatment goals met and discharge from hospital  Patient/family agrees with progress made and goals achieved: Yes   PT treatment:  Patient participated in grad day assessment; see below for results. Patient also instructed in [place] mobility on handicap access ramp for 51f to simulate access to home environment with supervision A and cues for improved use of BLE to maintain straight trajectory. PT instructed patient in car transfer with supervision A and min cues for improved WC and SB positioning prior to transfer for improved safety and success. Gait training in parallel bars with min A from PT and mod verbal and visual cues for improved push through BUE to allow foot clearance and LLE advancement. Patient also demonstrated proper pressure relief for and was able to verbally explain process, frequency, and importance following min cueing from PT.  Following treatment; patient returned to room and left sitting in [facility] within reach and all other needs met.   PT  Discharge Precautions/Restrictions Precautions Precautions: Fall Restrictions Weight Bearing Restrictions: Yes RLE Weight Bearing: Non weight bearing Vital Signs Therapy Vitals Temp Source: Oral Pulse Rate: 63 Resp: 17 BP: (!) 121/58 mmHg Patient Position (if appropriate): Sitting Oxygen Therapy SpO2: 97 % O2 Device: Not Delivered Pain Pain Assessment Pain Assessment: No/denies pain Pain Score: 1  Vision/Perception     Cognition Overall Cognitive Status: Within Functional Limits for tasks assessed Orientation Level: Oriented X4 Memory: Appears intact Awareness: Appears intact Problem Solving: Appears intact Safety/Judgment: Appears intact Sensation Sensation Light Touch: Appears Intact Proprioception: Appears Intact Additional Comments: Occassional phantom limb sensation in RLE.  Coordination Gross Motor Movements are Fluid and Coordinated: Yes Fine Motor Movements are Fluid and Coordinated: Yes Motor  Motor Motor: Other (comment) Motor - Skilled Clinical Observations: General weakness.   Mobility Bed Mobility Bed Mobility: Rolling Right;Rolling Left;Supine to Sit;Sit to Supine Rolling Right: 6: Modified independent (Device/Increase time) Rolling Left: 6: Modified independent (Device/Increase time) Supine to Sit: 6: Modified independent (Device/Increase time) Sit to Supine: 6: Modified independent (Device/Increase time) Transfers Transfers: Yes Sit to Stand: 4: Min guard Sit to Stand Details: Verbal cues for sequencing;Verbal cues for technique;Verbal cues for precautions/safety Stand to Sit: 4: Min guard Stand to Sit Details (indicate cue type and reason): Verbal cues for sequencing;Verbal cues for technique;Verbal cues for precautions/safety Lateral/Scoot Transfers: 6: Modified independent (Device/Increase time) Locomotion  Ambulation Ambulation: Yes Ambulation/Gait Assistance: 4: Min assist Ambulation Distance (Feet): 5 Feet Assistive device: Parallel  bars Ambulation/Gait Assistance Details: Tactile cues for sequencing;Tactile cues for weight shifting;Visual cues for safe use of DME/AE;Visual cues/gestures for precautions/safety;Visual cues/gestures for sequencing;Verbal cues for sequencing;Verbal cues for technique;Verbal cues for precautions/safety;Verbal cues for gait pattern;Verbal cues  for safe use of DME/AE Stairs / Additional Locomotion Stairs: No Architect: Yes Wheelchair Assistance: 6: Modified independent (Device/Increase time) Environmental health practitioner: Both upper extremities Wheelchair Parts Management: Independent Distance: 147f  Trunk/Postural Assessment  Cervical Assessment Cervical Assessment: Within Functional Limits Thoracic Assessment Thoracic Assessment: Ex[facility] to [facility](Flexed posture) Lumbar Assessment Lumbar Assessment: Exceptions to [facility](Posterior pelvic tilt) Postural Control Postural Control: Within Functional Limits  Balance Balance Balance Assessed: Yes Static Sitting Balance Static Sitting - Comment/# of Minutes: Mod I  without UE support  Dynamic Sitting Balance Dynamic Sitting Balance - Compensations: Mod I with functional task such as applying foot rests on WC  Static Standing Balance Static Standing - Comment/# of Minutes: Supervision A with BUE support  Dynamic Standing Balance Dynamic Standing - Comments: Min A with BUE support.  Extremity Assessment      RLE Assessment RLE Assessment: Exceptions to WL[facility] HospitalRLE AROM (degrees) RLE Overall AROM Comments: full knee extension.  RLE Strength RLE Overall Strength Comments: 4+/5 for all hip and knee motions. LLE Assessment LLE Assessment: Exceptions to WB[facility]CLLE Strength LLE Overall Strength Comments: 4+/5 hip flexion/extensoin, 4+/5 hip abducation/adduction. 5/5 knee flexion/extension   See Function Navigator for Current Functional Status.  ALorie Phenix5/18/2017, 12:50 PM

## 2016-02-02 NOTE — Consult Note (Signed)
   [facility] CM Inpatient Consult   02/02/2016  Thomas Wright 1946-01-01 [id number]   Follow up about [employer] providing glucometer to patient. Advised that there are not any glucometers that have strips at the office. Advised that [name] should obtain a prescription for glucometer and strips to have filed under Part B. Will pass this information along to inpatient rehab Licensed CSW. Made [name] aware of this as well. Will continue to follow.  Marthenia Rolling, MSN-Ed, RN,BSN [facility] Liaison [phone]

## 2016-02-02 NOTE — Discharge Summary (Signed)
Discharge summary job [id number]

## 2016-02-02 NOTE — Progress Notes (Signed)
Occupational Therapy Session Note  Patient Details  Name: Thomas Wright MRN: [id number] Date of Birth: 1946/06/28  Today's Date: 02/02/2016 OT Individual Time: [id number] OT Individual Time Calculation (min): 44 min    Short Term Goals: Week 2:  OT Short Term Goal 1 (Week 2): STG=LTG due to LOS  Skilled Therapeutic Interventions/Progress Updates:  Upon entering the room, pt seated on EOB awaiting therapist with no c/o pain this session. Pt demonstrated ability to wrap and don compression for R residual limb without assist. Pt performed set up of wheelchair and slide board for slide board transfer from bed >wheelchair with mod I. Pt propelled wheelchair into dayroom with mod I for increased time. Pt very anxious regarding upcoming discharge with questions for therapist regarding HHOT recommendation. OT also provided pt with energy conservation handouts and educated pt on circumstances such as doctor's appointment when it will be vital to conserve energy in order to remain safe. Pt verbalized understanding and propelled wheelchair back to room in same manner. Call bell and all needed items within reach upon exiting the room.   Therapy Documentation Precautions:  Precautions Precautions: Fall Restrictions Weight Bearing Restrictions: Yes RLE Weight Bearing: Non weight bearing General:   Vital Signs: Therapy Vitals Temp Source: Oral Pulse Rate: 63 Resp: 17 BP: (!) 121/58 mmHg Patient Position (if appropriate): Sitting Oxygen Therapy SpO2: 97 % O2 Device: Not Delivered  See Function Navigator for Current Functional Status.   Therapy/Group: Individual Therapy  [name] 02/02/2016, 4:18 PM

## 2016-02-02 NOTE — Progress Notes (Addendum)
[facility] PH[facility]ICINE [facility] RE[facility]ABILITATION     PROGRESS NOTE  Subjective/Complaints:  Patient sitting up in bed this morning. He states last night his blood glucose dropped to 110 and he started feeling some symptoms.   ROS: Denies phantom limb pain, CP, SOB, N/V/D.   Objective: Vital Signs: Blood pressure 149/81, pulse 64, temperature 97.5 F (36.4 C), temperature source Oral, resp. rate 18, height 6\' 1"  (1.854 m), weight 102.5 kg (225 lb 15.5 oz), SpO2 97 %. No results found.  Recent Labs  01/30/16 1056  WBC 6.2  HGB 9.3*  HCT 29.7*  PLT 320    Recent Labs  01/30/16 1056 02/01/16 1306  NA 136 136  K 5.3* 4.2  CL 100* 99*  GLUCOSE 192* 122*  BUN 25* 22*  CREATININE 1.41* 1.33*  CALCIUM 8.4* 8.7*   CBG (last 3)   Recent Labs  02/01/16 2049 02/02/16 0628 02/02/16 0653  GLUCAP 171* 109* 101*    Wt Readings from Last 3 Encounters:  02/02/16 102.5 kg (225 lb 15.5 oz)  01/17/16 111.721 kg (246 lb 4.8 oz)  01/04/16 106.595 kg (235 lb)    Physical Exam:  BP 149/81 mmHg  Pulse 64  Temp(Src) 97.5 F (36.4 C) (Oral)  Resp 18  Ht 6\' 1"  (1.854 m)  Wt 102.5 kg (225 lb 15.5 oz)  BMI 29.82 kg/m2  SpO2 97% Constitutional: He appears well-developed. Obese. NAD HENT: Normocephalic and atraumatic.  Eyes: Conjunctivae and EOM are normal.  Cardiovascular: Normal rate and regular rhythm. no murmurs  Respiratory: Effort normal and breath sounds normal. No respiratory distress. No wheezes or rales. GI: Soft. Bowel sounds are normal. He exhibits no distension.  Musculoskeletal: He exhibits No tenderness. +1 edema left foot (improving)..  Neurological: He is alert and oriented.  Motor: Bilateral upper extremity 5/5 proximal to distal Left lower extremity hip flexion, knee extension 5/5, ankle dorsi/plantar flexion 5/5 Right lower extremity hip flexion, Knee extension: 5/5  Skin:  BKA with staples, 2 small areas with minimal serous drainage along staple  line. First digit left lower extremity with minimal erythema (almost resolved) (no edema, no tenderness)  Psychiatric: He has a normal mood and affect. His behavior is normal  Assessment/Plan: 1. Functional deficits secondary to right BKA which require 3+ hours per day of interdisciplinary therapy in a comprehensive inpatient rehab setting. Physiatrist is providing close team supervision and 24 hour management of active medical problems listed below. Physiatrist and rehab team continue to assess barriers to discharge/monitor patient progress toward functional and medical goals.  Function:  Bathing Bathing position Bathing activity did not occur: Refused Position: Wheelchair/chair at sink  Bathing parts Body parts bathed by patient: Right arm, Left arm, Chest, Abdomen, Front perineal area, Right upper leg, Left upper leg, Left lower leg Body parts bathed by helper: Back  Bathing assist Assist Level: Supervision or verbal cues   Set up : To obtain items  Upper Body Dressing/Undressing Upper body dressing Upper body dressing/undressing activity did not occur: Refused What is the patient wearing?: Pull over shirt/dress     Pull over shirt/dress - Perfomed by patient: Thread/unthread right sleeve, Thread/unthread left sleeve, Put head through opening, Pull shirt over trunk          Upper body assist Assist Level: More than reasonable time   Set up : To obtain clothing/put away  Lower Body Dressing/Undressing Lower body dressing Lower body dressing/undressing activity did not occur: Refused What is the patient wearing?: Pants  Pants- Performed by patient: Thread/unthread right pants leg, Thread/unthread left pants leg, Pull pants up/down Pants- Performed by helper: Pull pants up/down Non-skid slipper socks- Performed by patient: Don/doff left sock   Socks - Performed by patient: Don/doff left sock (R n/a) Socks - Performed by helper: Don/doff left sock   Shoes - Performed by  helper: Don/doff left shoe, Fasten left       TED Hose - Performed by helper: Don/doff left TED hose  Lower body assist Assist for lower body dressing: Touching or steadying assistance (Pt > 75%), More than reasonable time      Toileting Toileting   Toileting steps completed by patient: Adjust clothing prior to toileting, Performs perineal hygiene, Adjust clothing after toileting Toileting steps completed by helper: Adjust clothing prior to toileting, Performs perineal hygiene, Adjust clothing after toileting Toileting Assistive Devices: Grab bar or rail  Toileting assist Assist level: Supervision or verbal cues   Transfers Chair/bed transfer Chair/bed transfer activity did not occur: Refused Chair/bed transfer method: Lateral scoot Chair/bed transfer assist level: Set up only Chair/bed transfer assistive device: Armrests, Sliding board     Locomotion Ambulation Ambulation activity did not occur: Safety/medical concerns   Max distance: 37ft Assist level: Moderate assist (Pt 50 - 74%)   Wheelchair   Type: Manual Max wheelchair distance: 144ft Assist Level: No help, No cues, assistive device, takes more than reasonable amount of time  Cognition Comprehension Comprehension assist level: Follows complex conversation/direction with extra time/assistive device  Expression Expression assist level: Expresses complex ideas: With extra time/assistive device  Social Interaction Social Interaction assist level: Interacts appropriately with others with medication or extra time (anti-anxiety, antidepressant).  Problem Solving Problem solving assist level: Solves complex 90% of the time/cues < 10% of the time  Memory Memory assist level: More than reasonable amount of time    Medical Problem List and Plan: 1. Limited mobility secondary to right below-knee amputation 01/13/2016 secondary to gangrenous changes/multi-medical  Continue CIR therapies  Stump shrinker ordered  Plan for DC  tomorrow  Will see patient for transitional care management in 1-2 weeks 2. DVT Prophylaxis/Anticoagulation: Continue Eliquis as prior to admission. 3. Pain Management: Hydrocodone and Robaxin as needed. Monitor with increased mobility 4. Diabetes mellitus with peripheral neuropathy. Hemoglobin A1c 12.6.   Per patient range to stay between 140-160, He states he was on Lantus 25 units daily at bedtime, NovoLog 3 units twice a day with breakfast and lunch, NovoLog 6 units with supper  Lantus insulin 27u qhs.   Added am lantus 5u on 5/14, DC'd on 5/18   Novolog to 3u tid w/ meals  Relatively improved per the patient's standards, patient appears to have "symptoms" when he is aware that his CBGs are below 140, suggestive of component of anxiety  Will not make any further adjustments. Will have patient follow-up as outpatient for further monitoring and adjustments  Check blood sugars before meals and at bedtime.   5. Neuropsych: This patient is capable of making decisions on his own behalf. 6. Skin/Wound Care: Routine skin checks.   -wound examined and re-dressed. Continue current care 7. Fluids/Electrolytes/Nutrition: Routine I&O   Mild hyponatremia: Resolved on 5/11 8.Leukocytosis. Slowly improving. Maxipime completed 01/17/2016  Resolved, WBCs 6.2 on 5/15 9. Hypertension/PAF.    Amiodarone 200 mg daily  Coreg 6.25 mg twice a day, increased to 12.5 on 5/5, increased to 25 on 5/8  Lisinopril 5mg  qHS started on 5/17, increased to 10 mg a 5/18  Likely exacerbated by anxiety  Will cont to monitor and consider further adjustments if persistently elevated 10. Chronic combined systolic and diastolic congestive heart failure.   Weigh patient daily  Weight   107 on 5/17, 102 today -   clinically stable  Chest x-ray from 5/15 ordered due to unreliable recordings and reviewed,  suggesting interstitial edema mild left pleural effusion  Lasix 40 mg daily started on 5/16, will transition to 20 mg daily  after a few of days 11. Acute renal insufficiency/presumably ischemic ATN. Renal ultrasound unremarkable.   Cr 1. 33 on 5/17 (improving)  Will order labs for tomorrow due to initiation of lisinopril 12. Hyperlipidemia. Lipitor 13. ABLA  Hb 9.3 on 5/15 14. Trauma to left great toe (almost resolved)  Distal first digit of left lower extremity with minimal erythema, improving  Continue to monitor for increased erythema, edema, tenderness  LOS (Days) 16 A FACE TO FACE EVALUATION WAS PERFORMED  Gabriella Woodhead Lorie Phenix 02/02/2016 8:38 AM

## 2016-02-02 NOTE — Progress Notes (Signed)
Social Work Patient ID: Thomas Wright, male   DOB: 12-25-1945, 70 y.o.   MRN: [id number]   CSW met with pt to update him on team conference discussion.  Pt is still on track to d/c on 02-03-16.  Pt feels he is ready and has a few different friends helping him with various things at home.  Ramp is done and concrete has been poured, so pt should be able to use the ramp tomorrow assuming someone can help him get into the house.  CSW completed [name] application with pt.  [facility] liaison came by to see pt today and they will follow him at home.  CSW set up home health and DME has already been delivered to room with hospital bed and commode to be delivered tomorrow to pt's home.  CSW will continue to follow and assist as needed.

## 2016-02-02 NOTE — Progress Notes (Signed)
Occupational Therapy Session Note  Patient Details  Name: Thomas Wright MRN: [id number] Date of Birth: [birth year]  Today's Date: 02/02/2016 OT Individual Time: 1330-1415 OT Individual Time Calculation (min): 45 min    Short Term Goals: Week 2:  OT Short Term Goal 1 (Week 2): STG=LTG due to LOS  Skilled Therapeutic Interventions/Progress Updates: Therapeutic exercises with focus on BUE strengthening and general endurance.   Pt re-ed on HEP.   Pt completed 10 min of UE ergometry at Sci-Fit, level 1, random routine without evidence of fatigue at end of session (HR=76 bpm).   Pt self-propelled w/c to ortho gym and completed 3 sets of 10 repetitions, lateral pull-downs w/o evidence of fatigue or complaint.   OT and pt discussed d/c disposition and pt maintains that he will remain in living room using hospital bed, w/c, and BSC for BADL, assisted by a friend who will reside with him approx 4-6 weeks.    Pt self-propels w/c back to his room and remains seated in w/c at end of session.   Pt requires min instructional cues throughout session to problem-solve w/c management to include removing leg rests to improve access to cabinets and surfaces.     Therapy Documentation Precautions:  Precautions Precautions: Fall Restrictions Weight Bearing Restrictions: Yes RLE Weight Bearing: Non weight bearing  Vital Signs: Therapy Vitals Temp Source: Oral Pulse Rate: 63 Resp: 17 BP: (!) 121/58 mmHg Patient Position (if appropriate): Sitting Oxygen Therapy SpO2: 97 % O2 Device: Not Delivered   Pain: No/denies pain    See Function Navigator for Current Functional Status.   Therapy/Group: Individual Therapy   Second session: Time: [id number] Time Calculation (min):  45 min  Pain Assessment: No/denies pain  Skilled Therapeutic Interventions: ADL-retraining with focus on DME and AE re-ed.   Pt received new w/c and requests assist with setup of cushion, leg rest, and review of features of w/c.    OT adjusts w/c to pt's preference.  Pt completed slide board transfer to w/c with steadying assist to secure both w/c's.   Pt then propels w/c to ADL kitchen and completes activity to reinforce goals of homemaking at w/c level.   Pt simulates retrieving items from refrigerator with min instructional cues to problem-solve.   Pt educated on methods to remove waste from [facility] and states he will place a bag in [facility] before voiding to allow disposal of waste in trash container at front porch, if needed or if friend is unable to dispose of waste for him in his bathroom.   Pt remains unsure if he will have access to his bathroom at w/c level during session and during entire admission d/t ambiguous measurements from his landlord.   Pt returned to his room at end of session with all needs within reach.   OT provided notes on pt's goals to reinforce pt's priorities.     See FIM for current functional status  Therapy/Group: Individual Therapy  Salome Spotted 02/02/2016, 2:28 PM

## 2016-02-02 NOTE — Consult Note (Signed)
   [facility] Inpatient Consult   02/02/2016  Thomas Wright Jan 20, 1946 [id number]    [facility] Care Management follow up. Thomas Wright is slated to discharge home from inpatient rehab. Spoke with inpatient rehab Licensed CSW to discuss prior to bedside visit. He will have home health thru [facility]. He will have equipment as well. Informed that [name] application will be started. Ramp is already in place. Spoke with Thomas Wright who indicates he will need ongoing assistance with [facility] Care Management. He reports he will need assistance for mobile meals, medication assistance for insulin, and will need a new glucometer. Thomas Wright reports he has limited resources and will be unable to obtain a new glucometer. Made him aware that writer will follow up with [facility] Management leadership regarding glucometer. Also discussed that he will have someone to stay with him post discharge. However, he says he will be interested in looking into a life alert time device. Will follow up with [facility] team to make aware of patient's concerns and ongoing needs. Made Thomas Wright aware that Probation officer will follow up with him prior to discharge regarding glucometer and [facility] referrals that have been made. Left contact information at bedside.   Confirmed best contact number as [phone] cell.   Marthenia Rolling, MSN-Ed, RN,BSN [facility] Liaison [phone]

## 2016-02-03 ENCOUNTER — Other Ambulatory Visit: Payer: Self-pay

## 2016-02-03 ENCOUNTER — Other Ambulatory Visit: Payer: Self-pay | Admitting: *Deleted

## 2016-02-03 LAB — GLUCOSE, CAPILLARY
GLUCOSE-CAPILLARY: 88 mg/dL (ref 65–99)
GLUCOSE-CAPILLARY: 103 mg/dL — AB (ref 65–99)
Glucose-Capillary: 166 mg/dL — ABNORMAL HIGH (ref 65–99)
Glucose-Capillary: 171 mg/dL — ABNORMAL HIGH (ref 65–99)

## 2016-02-03 LAB — BASIC METABOLIC PANEL
Anion gap: 8 (ref 5–15)
BUN: 22 mg/dL — AB (ref 6–20)
CO2: 33 mmol/L — ABNORMAL HIGH (ref 22–32)
CREATININE: 1.25 mg/dL — AB (ref 0.61–1.24)
Calcium: 8.7 mg/dL — ABNORMAL LOW (ref 8.9–10.3)
Chloride: 97 mmol/L — ABNORMAL LOW (ref 101–111)
GFR, EST NON AFRICAN AMERICAN: 57 mL/min — AB (ref >60)
Glucose, Bld: 106 mg/dL — ABNORMAL HIGH (ref 65–99)
Potassium: 4.1 mmol/L (ref 3.5–5.1)
SODIUM: 138 mmol/L (ref 135–145)

## 2016-02-03 MED ORDER — METHOCARBAMOL 500 MG PO TABS: 500.0000 mg | ORAL_TABLET | Freq: Four times a day (QID) | ORAL | Status: DC | PRN

## 2016-02-03 MED ORDER — ATORVASTATIN CALCIUM 10 MG PO TABS: 10.0000 mg | ORAL_TABLET | Freq: Every morning | ORAL | Status: DC

## 2016-02-03 MED ORDER — APIXABAN 5 MG PO TABS: 5.0000 mg | ORAL_TABLET | Freq: Two times a day (BID) | ORAL | Status: DC

## 2016-02-03 MED ORDER — CARVEDILOL 25 MG PO TABS: 25.0000 mg | ORAL_TABLET | Freq: Two times a day (BID) | ORAL | Status: DC

## 2016-02-03 MED ORDER — AMIODARONE HCL 200 MG PO TABS: 200.0000 mg | ORAL_TABLET | Freq: Every day | ORAL | Status: DC

## 2016-02-03 MED ORDER — LISINOPRIL 10 MG PO TABS: 10.0000 mg | ORAL_TABLET | Freq: Every day | ORAL | Status: DC

## 2016-02-03 MED ORDER — INSULIN GLARGINE 100 UNIT/ML ~~LOC~~ SOLN: 27.0000 [IU] | Freq: Every day | SUBCUTANEOUS | Status: DC

## 2016-02-03 MED ORDER — HYDROCODONE-ACETAMINOPHEN 5-325 MG PO TABS: 1.0000 | ORAL_TABLET | ORAL | Status: DC | PRN

## 2016-02-03 MED ORDER — INSULIN ASPART 100 UNIT/ML ~~LOC~~ SOLN: 3.0000 [IU] | Freq: Three times a day (TID) | SUBCUTANEOUS | Status: DC

## 2016-02-03 MED ORDER — FUROSEMIDE 40 MG PO TABS: 40.0000 mg | ORAL_TABLET | Freq: Every day | ORAL | Status: DC

## 2016-02-03 MED FILL — TRUEPLUS SYR 0.5ML 30GX5/16: 30G X 5/16" | 90 days supply | Qty: 100 | Fill #0

## 2016-02-03 MED FILL — LANTUS 100 UNITS/ML VIAL: 100 | 28 days supply | Qty: 10 | Fill #0

## 2016-02-03 MED FILL — NovoLOG 100 UNIT/ML SOLN: 100 | 30 days supply | Qty: 10 | Fill #0

## 2016-02-03 NOTE — Discharge Instructions (Signed)
Inpatient Rehab Discharge Instructions  KALVEN ARGENT Discharge date and time: No discharge date for patient encounter.   Activities/Precautions/ Functional Status: Activity: activity as tolerated Diet: diabetic diet Wound Care: keep wound clean and dry Functional status:  ___ No restrictions     ___ Walk up steps independently ___ 24/7 supervision/assistance   ___ Walk up steps with assistance ___ Intermittent supervision/assistance  ___ Bathe/dress independently ___ Walk with walker     _x__ Bathe/dress with assistance ___ Walk Independently    ___ Shower independently ___ Walk with assistance    ___ Shower with assistance ___ No alcohol     ___ Return to work/school ________  COMMUNITY REFERRALS UPON DISCHARGE:   Home Health:   PT     OT     RN    Agency:  [facility] Phone:  [phone] Medical Equipment/Items Ordered:  20"x18" lightweight wheelchair with basic cushion; 30" slide board; wide arm drop arm commode; hospital bed  Agency/Supplier:  [facility]        Phone:  [phone]  GENERAL COMMUNITY RESOURCES FOR PATIENT/FAMILY: Support Groups:  [facility] [place]                              Meets 2nd Tuesday of the month from 7 - 8:30 PM                              [place], call [phone] Transportation:    [name]  [phone] - they should call you to set up an interview                              [employer] may also assist with medical transportation  Special Instructions: Follow up with Dr. Sharol Given for removal of staples   Follow-up outpatient rehabilitation office Dr. Posey Pronto [address], [place], [place] phone number [phone]. Office to call for appointment   My questions have been answered and I understand these instructions. I will adhere to these goals and the provided educational materials after my discharge from the  hospital.  Patient/Caregiver Signature _______________________________ Date __________  Clinician Signature _______________________________________ Date __________  Please bring this form and your medication list with you to all your follow-up doctor's appointments.

## 2016-02-03 NOTE — Progress Notes (Signed)
[facility] PHYSICAL MEDICINE & REHABILITATION     PROGRESS NOTE  Subjective/Complaints:  Blood sugar in the 80s last night had some symptoms , felt better when blood sugar was over 100 after some apple juice  ROS: Denies phantom limb pain, CP, SOB, N/V/D.   Objective: Vital Signs: Blood pressure 164/65, pulse 62, temperature 97.8 F (36.6 C), temperature source Oral, resp. rate 18, height 6\' 1"  (1.854 m), weight 103 kg (227 lb 1.2 oz), SpO2 100 %. No results found. No results for input(s): WBC, HGB, HCT, PLT in the last 72 hours.  Recent Labs  02/01/16 1306  NA 136  K 4.2  CL 99*  GLUCOSE 122*  BUN 22*  CREATININE 1.33*  CALCIUM 8.7*   CBG (last 3)   Recent Labs  02/02/16 1639 02/02/16 2107 02/03/16 0503  GLUCAP 158* 226* 88    Wt Readings from Last 3 Encounters:  02/03/16 103 kg (227 lb 1.2 oz)  01/17/16 111.721 kg (246 lb 4.8 oz)  01/04/16 106.595 kg (235 lb)    Physical Exam:  BP 164/65 mmHg  Pulse 62  Temp(Src) 97.8 F (36.6 C) (Oral)  Resp 18  Ht 6\' 1"  (1.854 m)  Wt 103 kg (227 lb 1.2 oz)  BMI 29.97 kg/m2  SpO2 100% Constitutional: He appears well-developed. Obese. NAD HENT: Normocephalic and atraumatic.  Eyes: Conjunctivae and EOM are normal.  Cardiovascular: Normal rate and regular rhythm. no murmurs  Respiratory: Effort normal and breath sounds normal. No respiratory distress. No wheezes or rales. GI: Soft. Bowel sounds are normal. He exhibits no distension.  Musculoskeletal: He exhibits No tenderness. +1 edema left foot (improving)..  Neurological: He is alert and oriented.  Motor: Bilateral upper extremity 5/5 proximal to distal Left lower extremity hip flexion, knee extension 5/5, ankle dorsi/plantar flexion 5/5 Right lower extremity hip flexion, Knee extension: 5/5  Skin:  BKA with staples, no evidence of drainage currently, small amount of drainage on KerlixPsychiatric: He has a normal mood and affect. His behavior is  normal  Assessment/Plan: 1. Functional deficits secondary to right BKA  Stable for D/C today F/u PCP in 3-4 weeks F/u PM&R 2 weeks Dr Posey Pronto, Transitional care visit See D/C summary See D/C instructions Function:  Bathing Bathing position Bathing activity did not occur: Refused Position: Wheelchair/chair at sink  Bathing parts Body parts bathed by patient: Right arm, Left arm, Chest, Abdomen, Front perineal area, Right upper leg, Left upper leg, Left lower leg Body parts bathed by helper: Back  Bathing assist Assist Level: Supervision or verbal cues   Set up : To obtain items  Upper Body Dressing/Undressing Upper body dressing Upper body dressing/undressing activity did not occur: Refused What is the patient wearing?: Pull over shirt/dress     Pull over shirt/dress - Perfomed by patient: Thread/unthread right sleeve, Thread/unthread left sleeve, Put head through opening, Pull shirt over trunk          Upper body assist Assist Level: More than reasonable time   Set up : To obtain clothing/put away  Lower Body Dressing/Undressing Lower body dressing Lower body dressing/undressing activity did not occur: Refused What is the patient wearing?: Pants     Pants- Performed by patient: Thread/unthread right pants leg, Thread/unthread left pants leg, Pull pants up/down Pants- Performed by helper: Pull pants up/down Non-skid slipper socks- Performed by patient: Don/doff left sock   Socks - Performed by patient: Don/doff left sock (R n/a) Socks - Performed by helper: Don/doff left sock   Shoes -  Performed by helper: Don/doff left shoe, Fasten left       TED Hose - Performed by helper: Don/doff left TED hose  Lower body assist Assist for lower body dressing: Touching or steadying assistance (Pt > 75%), More than reasonable time      Toileting Toileting   Toileting steps completed by patient: Adjust clothing prior to toileting, Performs perineal hygiene, Adjust clothing after  toileting Toileting steps completed by helper: Adjust clothing prior to toileting, Performs perineal hygiene, Adjust clothing after toileting Toileting Assistive Devices: Grab bar or rail  Toileting assist Assist level: Supervision or verbal cues   Transfers Chair/bed transfer Chair/bed transfer activity did not occur: Refused Chair/bed transfer method: Lateral scoot Chair/bed transfer assist level: No Help, no cues, assistive device, takes more than a reasonable amount of time Chair/bed transfer assistive device: Sliding board     Locomotion Ambulation Ambulation activity did not occur: Safety/medical concerns   Max distance: 71ft Assist level: Touching or steadying assistance (Pt > 75%)   Wheelchair   Type: Manual Max wheelchair distance: 150 Assist Level: No help, No cues, assistive device, takes more than reasonable amount of time  Cognition Comprehension Comprehension assist level: Follows complex conversation/direction with extra time/assistive device  Expression Expression assist level: Expresses complex ideas: With extra time/assistive device  Social Interaction Social Interaction assist level: Interacts appropriately with others with medication or extra time (anti-anxiety, antidepressant).  Problem Solving Problem solving assist level: Solves complex problems: With extra time  Memory Memory assist level: More than reasonable amount of time    Medical Problem List and Plan: 1. Limited mobility secondary to right below-knee amputation 01/13/2016 secondary to gangrenous changes/multi-medical  DC home  Stump shrinker on except for bathing   Will see patient for transitional care management in 1-2 weeks 2. DVT Prophylaxis/Anticoagulation: Continue Eliquis as prior to admission. 3. Pain Management: Hydrocodone and Robaxin as needed. Monitor with increased mobility 4. Diabetes mellitus with peripheral neuropathy. Hemoglobin A1c 12.6.   Per patient range to stay between  140-160, He states he was on Lantus 25 units daily at bedtime, NovoLog 3 units twice a day with breakfast and lunch, NovoLog 6 units with supper  Lantus insulin 27u qhs.   Added am lantus 5u on 5/14, DC'd on 5/18   Novolog to 3u tid w/ meals  Relatively improved per the patient's standards, patient appears to have "symptoms" when he is aware that his CBGs are below 140, suggestive of component of anxiety  Will not make any further adjustments. Will have patient follow-up as outpatient for further monitoring and adjustments  Check blood sugars before meals and at bedtime.   5. Neuropsych: This patient is capable of making decisions on his own behalf. 6. Skin/Wound Care: Routine skin checks.   -wound examined and re-dressed. Continue current care 7. Fluids/Electrolytes/Nutrition: Routine I&O   Mild hyponatremia: Resolved on 5/11 8.Leukocytosis. Slowly improving. Maxipime completed 01/17/2016  Resolved, WBCs 6.2 on 5/15 9. Hypertension/PAF.    Amiodarone 200 mg daily  Coreg 6.25 mg twice a day, increased to 12.5 on 5/5, increased to 25 on 5/8  Lisinopril 5mg  qHS started on 5/17, increased to 10 mg a 5/18  Likely exacerbated by anxiety  Will cont to monitor and consider further adjustments if persistently elevated 10. Chronic combined systolic and diastolic congestive heart failure.   Weigh patient daily    Chest x-ray from 5/15 ordered due to unreliable recordings and reviewed,  suggesting interstitial edema mild left pleural effusion  Lasix 40  mg daily started on 5/16, will change to 20 mg daily  11. Acute renal insufficiency/presumably ischemic ATN. Renal ultrasound unremarkable.   Cr 1. 33 on 5/17 (improving)  Now on lisinopril 12. Hyperlipidemia. Lipitor 13. ABLA  Hb 9.3 on 5/15 14. Trauma to left great toe (almost resolved)  LOS (Days) 17 A FACE TO FACE EVALUATION WAS PERFORMED  Charlett Blake 02/03/2016 7:53 AM

## 2016-02-03 NOTE — Discharge Summary (Signed)
Thomas Wright, PETRY NO.:  [id number]  MEDICAL RECORD NO.:  [id number]  LOCATION:  4W03C                        FACILITY:  [facility]  PHYSICIAN:  Delice Lesch, MD        DATE OF BIRTH:  04-05-1946  DATE OF ADMISSION:  01/17/2016 DATE OF DISCHARGE:  02/03/2016                              DISCHARGE SUMMARY   DISCHARGE DIAGNOSES: 1. 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 2. Pain management. 3. Diabetes mellitus, peripheral neuropathy. 4. Hypertension. 5. Paroxysmal atrial fibrillation. 6. Chronic combined systolic and diastolic congestive heart failure. 7. Acute renal insufficiency. 8. Acute blood loss anemia. 9. Trauma to left great toe.  HISTORY OF PRESENT ILLNESS:  This is a 70 year old right-handed male with history of hypertension, PAF maintained on Eliquis, diabetes mellitus, aortic regurgitation status post valve replacement, and chronic diastolic and systolic congestive heart failure.  The patient lives alone, independent with a cane prior to admission.  Mobile home, 9 steps to entry.  Presented on January 09, 2016, with low-grade fever, tachycardia, leukocytosis 28,000, lactic acid 3.58, and gangrenous right foot.  X-rays of foot could not exclude very early changes of osteomyelitis.  Elevated creatinine 2.34 from baseline 0.9-1.1.  Renal ultrasound showed no acute abnormalities.  Renal Services consulted, felt AKI in setting of early sepsis, presumed ischemic ATN. Conservative care, close monitoring of renal function, gentle IV fluids. MRSA PCR screening positive.  Right foot with continued gangrenous changes, underwent right below-knee amputation on January 13, 2016, per Dr. Sharol Given.  Application of wound VAC.  HOSPITAL COURSE AND PAIN MANAGEMENT:  Acute blood loss anemia 10.7 and monitored.  Physical and occupational therapy ongoing.  The patient was admitted for comprehensive rehab program.  PAST MEDICAL HISTORY:  See  discharge diagnoses.  SOCIAL HISTORY:  Lives alone, independent prior to admission with a cane, mobile home.  Functional status upon admission to rehab services was +2 physical assist sit to stand; supervision, supine to sit; min to mod assist for activities of daily living.  PHYSICAL EXAMINATION:  VITAL SIGNS:  Blood pressure 139/62, pulse 68, temperature 98, respirations 18. GENERAL:  This was an alert male, in no acute distress, oriented x3. LUNGS:  Clear to auscultation without wheeze. CARDIAC:  Regular rate and rhythm without murmur. ABDOMEN:  Soft, nontender.  Good bowel sounds. EXTREMITIES:  Below-knee amputation site was dressed, appropriately tender.  [place] COURSE:  The patient was admitted to inpatient rehab services with therapies initiated on a 3-hour daily basis, consisting of physical therapy, occupational therapy, and rehabilitation nursing.  The following issues were addressed during the patient's rehabilitation stay.  Pertaining to [name]' right below- knee amputation, surgical site healing nicely.  He would follow up with Dr. Sharol Given, stump shrinker had been ordered.  He remained on Eliquis for history of PAF, cardiac rate controlled, no chest pain or shortness of breath.  Diabetes mellitus, peripheral neuropathy, hemoglobin A1c of 12.6, insulin therapy as advised, he would follow up with his primary care provider, full diabetic teaching completed.  Blood pressures controlled, monitored, no orthostatic changes.  He remained on amiodarone 200 mg daily,  Coreg 25 mg b.i.d., Lasix 40 mg daily, lisinopril 5 mg daily.  Chronic combined systolic and diastolic congestive heart failure, he exhibited no signs of fluid overload.  He remained on Lasix therapy.  Acute renal insufficiency, improving nicely, latest creatinine 1.41, improved to 1.33.  Acute blood loss anemia, stable at 9.3, no bleeding episodes.  The patient received weekly collaborative  interdisciplinary team conferences to discuss estimated length of stay, family teaching, and any barriers to his discharge. Wheelchair mobility supervision with simple cues.  Stand pivot transfers, rolling walker.  Sit to stand, minimal assistance.  Performed car transfers with supervision, minimal cues.  Activities of daily living and homemaking.  The patient performed hygiene, lower body clothing and dressing and management at supervision.  Discussed with lateral leans for clothing management.  Full teaching completed and plan discharge to home with ongoing therapies dictated per Rehab Services.  DISCHARGE MEDICATIONS: 1. Amiodarone 200 mg p.o. daily. 2. Eliquis 5 mg p.o. b.i.d. 3. Lipitor 10 mg p.o. daily. 4. Coreg 25 mg p.o. b.i.d. 5. Lasix 40 mg p.o. daily. 6. Hydrocodone 1-2 tablets every 4 hours as needed pain, dispense of     90 tablets. 7. Lantus insulin 27 units at bedtime and 5 units daily. 8. Lisinopril 5 mg p.o. at bedtime. 9. Robaxin 500 mg p.o. every 6 hours as needed muscle spasms. 10.Multivitamin daily. 11. Aspirin 81 mg daily 12. NovoLog insulin 3 units 3 times a day   DIET:  Diabetic diet.  FOLLOWUP:  The patient would follow up with Dr. Delice Lesch, [place] office to call for appointment; Dr. [name], 2 weeks; Dr. Elmarie Shiley, Renal Services, call for appointment; and Dr. Leighton Ruff, Medical Management.  SPECIAL INSTRUCTIONS:  The patient will follow up with Dr. Sharol Given for removal of staples.     Lauraine Rinne, P.A.   ______________________________ Delice Lesch, MD    DA/MEDQ  D:  02/02/2016  T:  02/03/2016  Job:  [id number]  cc:   Elmarie Shiley, MD Leighton Ruff, MD Newt Minion, MD

## 2016-02-03 NOTE — Patient Outreach (Signed)
This RNCM made contact with patient for community care coordination. Patient identified herself by providing date of birth.  Patient informed this RNCM he was just released from acute care.  Patient agreed to community care coordination. RNCM and patient agreed to conversation via telephone on Monday, May 22 for initial transition of care.

## 2016-02-03 NOTE — Progress Notes (Signed)
Pt discharged to home by EMS; accompained by friend. VSS, verbalizes understanding of DC instructions given by PAC.

## 2016-02-03 NOTE — Patient Outreach (Signed)
[facility]) Care Management  02/03/2016  NAVRAJ SAMPAYO 10/28/1945 [id number]  Patient to discharge home today from rehab. Well-Dine meals ordered through [facility] to be delivered on 02/08/16 by [employer].  Order number [id number]   Plan:  Social  Worker to schedule home visit with patient once he arrives home to complete needs assessment.   [name] [facility] Care Management [phone]

## 2016-02-03 NOTE — Progress Notes (Signed)
Occupational Therapy Discharge Summary  Patient Details  Name: Thomas Wright MRN: [id number] Date of Birth: 1946-07-09   Patient has met 8 of 10 long term goals due to improved activity tolerance, improved balance, ability to compensate for deficits and improved awareness.  Patient to discharge at overall Modified Independent at w/c level, Supervision-Min A for bathroom transfers.  Patient's care partner unavailable to provide the necessary physical assistance at discharge.  Pt able to progress through CIR admission to manage BADL adequately and independently and he articulates a reasonable plan to recruit assist from neighbors and friends for iADL support.    Reasons goals not met: Pt is unable to define dimensions of home environment sufficient to simulate ability to access his bedroom and bathroom.   Goals modified to accommodate pt's insistence that a friend of his will be residing with him for 4-6 weeks during his recovery at home.   Discharged plan established assuming pt will use a hospital bed in his living room and a bedside commode for toileting with plan to perform bathing at EOB, supine or at sink in his kitchen.     Recommendation:  Patient will benefit from ongoing skilled OT services in home health setting to continue to advance functional skills in the area of BADL and iADL.  Equipment: Drop-Arm BSC, LH sponge (pt states he has access to his own reacher at home)  Reasons for discharge: discharge from hospital  Patient/family agrees with progress made and goals achieved: Yes  OT Discharge Precautions/Restrictions  Precautions Precautions: Fall Restrictions Weight Bearing Restrictions: Yes RLE Weight Bearing: Non weight bearing   Vital Signs Therapy Vitals Temp: 97.8 F (36.6 C) Temp Source: Oral Pulse Rate: 62 Resp: 18 BP: (!) 164/65 mmHg Patient Position (if appropriate): Lying Oxygen Therapy SpO2: 100 % O2 Device: Not Delivered;Nasal Cannula   Pain Pain  Assessment Pain Assessment: No/denies pain   ADL ADL ADL Comments: see Functional Assessment Tool   Vision/Perception  Vision- History Baseline Vision/History: Wears glasses Wears Glasses: At all times Patient Visual Report: No change from baseline Vision- Assessment Vision Assessment?: No apparent visual deficits Perception Comments: No deficits noted during BADL/iADL tasks   Cognition Overall Cognitive Status: Within Functional Limits for tasks assessed Arousal/Alertness: Awake/alert Orientation Level: Oriented X4 Attention: Divided Divided Attention: Appears intact Memory: Appears intact Awareness: Appears intact Problem Solving: Appears intact Reasoning: Appears intact Safety/Judgment: Appears intact   Sensation Sensation Light Touch: Appears Intact Stereognosis: Appears Intact Hot/Cold: Appears Intact Proprioception: Appears Intact Additional Comments: Occassional phantom limb sensation in RLE.  Coordination Gross Motor Movements are Fluid and Coordinated: Yes Fine Motor Movements are Fluid and Coordinated: Yes   Motor  Motor Motor: Other (comment) Motor - Skilled Clinical Observations: General weakness.    Mobility  Bed Mobility Bed Mobility: Rolling Right;Rolling Left;Supine to Sit;Sit to Supine Rolling Right: 6: Modified independent (Device/Increase time) Rolling Left: 6: Modified independent (Device/Increase time) Supine to Sit: 6: Modified independent (Device/Increase time) Sit to Supine: 6: Modified independent (Device/Increase time) Transfers Transfers: Sit to Stand;Stand to Sit Sit to Stand: 4: Min guard Sit to Stand Details: Verbal cues for sequencing;Verbal cues for technique;Verbal cues for precautions/safety Stand to Sit: 4: Min guard Stand to Sit Details (indicate cue type and reason): Verbal cues for sequencing;Verbal cues for technique;Verbal cues for precautions/safety   Trunk/Postural Assessment  Cervical Assessment Cervical  Assessment: Within Functional Limits Thoracic Assessment Thoracic Assessment: Exceptions to [facility] (Chronic flexed posture) Lumbar Assessment Lumbar Assessment: Exceptions to [facility] (lordosis) Postural Control  Postural Control: Within Functional Limits   Balance Balance Balance Assessed: Yes Static Sitting Balance Static Sitting - Level of Assistance: 6: Modified independent (Device/Increase time) Dynamic Sitting Balance Dynamic Sitting - Level of Assistance: 6: Modified independent (Device/Increase time) Dynamic Sitting - Balance Activities: Reaching for objects;Reaching across midline Static Standing Balance Static Standing - Balance Support: Bilateral upper extremity supported Static Standing - Level of Assistance: 4: Min assist Static Standing - Comment/# of Minutes: limited to approx 60 seconds d/t generalized weakness Dynamic Standing Balance Dynamic Standing - Balance Support: Left upper extremity supported Dynamic Standing - Level of Assistance: 4: Min assist Dynamic Standing - Balance Activities: Forward lean/weight shifting;Lateral lean/weight shifting   Extremity/Trunk Assessment RUE Assessment RUE Assessment: Within Functional Limits LUE Assessment LUE Assessment: Within Functional Limits   See Function Navigator for Current Functional Status.  [facility] 02/03/2016, 7:11 AM

## 2016-02-03 NOTE — Progress Notes (Signed)
Social Work Discharge Note  The overall goal for the admission was met for:   Discharge location: Yes - home  Length of Stay: Yes - 17 days  Discharge activity level: Yes - modified independent  Home/community participation: Yes  Services provided included: MD, RD, PT, OT, RN, Pharmacy, [place]: Private Insurance: [employer] Medicare  Follow-up services arranged: Home Health: PT/OT/RN from [place], DME: 20"x18" lightweight w/c with amputee support pad on right; basic cushion; 30" slide board; wide drop arm commode from [place] and Patient/Family has no preference for HH/DME agencies  Comments (or additional information): Pt has good support from [facility] with nursing, social work, and pharmacy f/u.  CSW completed and faxed [name] application.  [facility] is working on meals on wheels.  Pt's landlord built ramp for pt with concrete slab poured.  Pt's friend Holley Raring helped to take pt's DME home and get him settled at home.  Pt needed ambulance transport home as concrete slab was not ready for w/c to go over it.  Pt will have [place] and that PT will assess when pt is ready for walker and CSW told pt this.  CSW and pt worked on his disability application for work, too.  Patient/Family verbalized understanding of follow-up arrangements: Yes  Individual responsible for coordination of the follow-up plan: pt with some support from friends  Confirmed correct DME delivered: Trey Sailors 02/03/2016    Landin Tallon, Silvestre Mesi

## 2016-02-03 NOTE — Patient Outreach (Signed)
I received a referral from Marthenia Rolling, RN, [facility], in regards to Thomas Wright.  She stated he was unable to afford his insulins, meter and strips once he is discharged.  I called Mr. Tecson, who is still inpatient, and confirmed.  He does qualify for the Pharmacy Emergency Fund to help purchase his medication, meter and strips.  I will have his insulins transferred to [facility] and a prescription sent to them as well for the meter and strips.  I will have [facility] deliver the items to him prior to his discharge.  I will follow up with Mr. Lawhun next week to see how he is doing as well as begin the process of finding a long term solution for assisting him to be able to afford his insulin.    Deanne Coffer, PharmD, [place] [phone]

## 2016-02-04 DIAGNOSIS — Z89511 Acquired absence of right leg below knee: Secondary | ICD-10-CM | POA: Diagnosis not present

## 2016-02-04 DIAGNOSIS — I480 Paroxysmal atrial fibrillation: Secondary | ICD-10-CM | POA: Diagnosis not present

## 2016-02-04 DIAGNOSIS — E1142 Type 2 diabetes mellitus with diabetic polyneuropathy: Secondary | ICD-10-CM | POA: Diagnosis not present

## 2016-02-04 DIAGNOSIS — F329 Major depressive disorder, single episode, unspecified: Secondary | ICD-10-CM | POA: Diagnosis not present

## 2016-02-04 DIAGNOSIS — I5042 Chronic combined systolic (congestive) and diastolic (congestive) heart failure: Secondary | ICD-10-CM | POA: Diagnosis not present

## 2016-02-04 DIAGNOSIS — I429 Cardiomyopathy, unspecified: Secondary | ICD-10-CM | POA: Diagnosis not present

## 2016-02-04 DIAGNOSIS — Z4781 Encounter for orthopedic aftercare following surgical amputation: Secondary | ICD-10-CM | POA: Diagnosis not present

## 2016-02-04 DIAGNOSIS — E785 Hyperlipidemia, unspecified: Secondary | ICD-10-CM | POA: Diagnosis not present

## 2016-02-04 DIAGNOSIS — I10 Essential (primary) hypertension: Secondary | ICD-10-CM | POA: Diagnosis not present

## 2016-02-06 ENCOUNTER — Other Ambulatory Visit: Payer: Self-pay | Admitting: *Deleted

## 2016-02-06 ENCOUNTER — Telehealth: Payer: Self-pay

## 2016-02-06 ENCOUNTER — Other Ambulatory Visit: Payer: Self-pay

## 2016-02-06 DIAGNOSIS — I480 Paroxysmal atrial fibrillation: Secondary | ICD-10-CM | POA: Diagnosis not present

## 2016-02-06 DIAGNOSIS — I5042 Chronic combined systolic (congestive) and diastolic (congestive) heart failure: Secondary | ICD-10-CM | POA: Diagnosis not present

## 2016-02-06 DIAGNOSIS — Z4781 Encounter for orthopedic aftercare following surgical amputation: Secondary | ICD-10-CM | POA: Diagnosis not present

## 2016-02-06 DIAGNOSIS — E785 Hyperlipidemia, unspecified: Secondary | ICD-10-CM | POA: Diagnosis not present

## 2016-02-06 DIAGNOSIS — E1142 Type 2 diabetes mellitus with diabetic polyneuropathy: Secondary | ICD-10-CM | POA: Diagnosis not present

## 2016-02-06 DIAGNOSIS — Z89511 Acquired absence of right leg below knee: Secondary | ICD-10-CM | POA: Diagnosis not present

## 2016-02-06 DIAGNOSIS — I10 Essential (primary) hypertension: Secondary | ICD-10-CM | POA: Diagnosis not present

## 2016-02-06 DIAGNOSIS — F329 Major depressive disorder, single episode, unspecified: Secondary | ICD-10-CM | POA: Diagnosis not present

## 2016-02-06 DIAGNOSIS — I429 Cardiomyopathy, unspecified: Secondary | ICD-10-CM | POA: Diagnosis not present

## 2016-02-06 NOTE — Telephone Encounter (Signed)
Transitional Care Questions   1. Are you/is patient experiencing any problems since coming home? Are there any questions regarding any aspect of care?  No problems since coming home. No questions at this time.   2. Are there any questions regarding medications administration/dosing? No Questions. Are meds being taken as prescribed? Yes.  Patient should review meds with caller to confirm Medications verbally reviewed with patient.   3. Have there been any falls? No Falls. States that he has been transferring well.   4. Has Home Health been to the house and/or have they contacted you? Yes, States that home health was there today.  If not, have you tried to contact them? Can we help you contact them?   5. Are bowels and bladder emptying properly? Yes. Last BM was 10 minutes previous to call and was normal.  Are there any unexpected incontinence issues? None. If applicable, is patient following bowel/bladder programs?   6. Any fevers, problems with breathing, unexpected pain?  No fevers, breathing problems or Unexpected pain.   7. Are there any skin problems or new areas of breakdown? No new skin issues. States that he has been using creams as directed.   8. Has the patient/family member arranged specialty MD follow up (ie cardiology/neurology/renal/surgical/etc)? Can we help arrange? None at this time. States that he has other Appointment scheduled.   9. Does the patient need any other services or support that we can help arrange?  Not at this time, if he thinks of any he will call us.   10. Are caregivers following through as expected in assisting the patient? Yes. States that he will have transportation to his visits.  11. Has the patient quit smoking, drinking alcohol, or using drugs as recommended? N/A   Patient scheduled for appointment on June 1st, 2017 at 1:40pm with Dr. Delice Lesch. Patient advised and verbalized understanding. Patient packet has been mailed.

## 2016-02-06 NOTE — Patient Outreach (Signed)
[place] [facility]) Care Management  02/06/2016  Thomas Wright May 18, 1946 [id number]   Follow up phone call to patient post discharge from rehab.  Per patient, he returned home on Friday and is trying to organize the rooms he is able to get into with his wheelchair.  His wheelchair ramp is up and per patient his physical therapist has been able to show him different techniques regarding the use of the ramp safely.  Per patient, he is working with physical therapy to build up strength on his left side.  Patient states that his [name] application has been completed and submitted.  Patient informed of the Well-Dine meals ordered that will be delivered on 02/08/16 and a additional resource for transportation through his insurance if needed.  Per patient, he has a friend who will be able to transport him to medical appointments, however is glad to know that he has a back-up. This social worker will contact patient on 02/14/16 to schedule a home visit to continue to assess for social work needs.    [name] [facility] Care Management [phone]

## 2016-02-06 NOTE — Patient Outreach (Signed)
This RNCM made telephonic contact with patient. Patient provided date of birth and address to satisfy HIPPA.  Patient and RNCM collaborated to create his case management care plan. Patient and RNCM agreed on home visit on May 31 for continued transition of care.

## 2016-02-07 DIAGNOSIS — I429 Cardiomyopathy, unspecified: Secondary | ICD-10-CM | POA: Diagnosis not present

## 2016-02-07 DIAGNOSIS — I480 Paroxysmal atrial fibrillation: Secondary | ICD-10-CM | POA: Diagnosis not present

## 2016-02-07 DIAGNOSIS — I10 Essential (primary) hypertension: Secondary | ICD-10-CM | POA: Diagnosis not present

## 2016-02-07 DIAGNOSIS — E1142 Type 2 diabetes mellitus with diabetic polyneuropathy: Secondary | ICD-10-CM | POA: Diagnosis not present

## 2016-02-07 DIAGNOSIS — Z89511 Acquired absence of right leg below knee: Secondary | ICD-10-CM | POA: Diagnosis not present

## 2016-02-07 DIAGNOSIS — I5042 Chronic combined systolic (congestive) and diastolic (congestive) heart failure: Secondary | ICD-10-CM | POA: Diagnosis not present

## 2016-02-07 DIAGNOSIS — F329 Major depressive disorder, single episode, unspecified: Secondary | ICD-10-CM | POA: Diagnosis not present

## 2016-02-07 DIAGNOSIS — E785 Hyperlipidemia, unspecified: Secondary | ICD-10-CM | POA: Diagnosis not present

## 2016-02-07 DIAGNOSIS — Z4781 Encounter for orthopedic aftercare following surgical amputation: Secondary | ICD-10-CM | POA: Diagnosis not present

## 2016-02-07 NOTE — Patient Outreach (Signed)
Initial telephone contact for transition of care. Patient was discharged from [facility] setting On Friday, May 19. Patient identified himself by providing date of birth and address.  Patient was agreeable to this telephone call to assess his need for community care coordination.  Findings: Patient lives alone, is able to perform ADLs independently. Patient is expecting a male friend to fly in from [place] to assist him with during his recover .  Patient states he can afford his medication.  Patient and RNCM collaborated to create this initial care plan Patient and RNCM agreed to home visit next week for chronic disease education.

## 2016-02-08 DIAGNOSIS — I480 Paroxysmal atrial fibrillation: Secondary | ICD-10-CM | POA: Diagnosis not present

## 2016-02-08 DIAGNOSIS — E1142 Type 2 diabetes mellitus with diabetic polyneuropathy: Secondary | ICD-10-CM | POA: Diagnosis not present

## 2016-02-08 DIAGNOSIS — Z4781 Encounter for orthopedic aftercare following surgical amputation: Secondary | ICD-10-CM | POA: Diagnosis not present

## 2016-02-08 DIAGNOSIS — Z89511 Acquired absence of right leg below knee: Secondary | ICD-10-CM | POA: Diagnosis not present

## 2016-02-08 DIAGNOSIS — I10 Essential (primary) hypertension: Secondary | ICD-10-CM | POA: Diagnosis not present

## 2016-02-08 DIAGNOSIS — I429 Cardiomyopathy, unspecified: Secondary | ICD-10-CM | POA: Diagnosis not present

## 2016-02-08 DIAGNOSIS — F329 Major depressive disorder, single episode, unspecified: Secondary | ICD-10-CM | POA: Diagnosis not present

## 2016-02-08 DIAGNOSIS — E785 Hyperlipidemia, unspecified: Secondary | ICD-10-CM | POA: Diagnosis not present

## 2016-02-08 DIAGNOSIS — I5042 Chronic combined systolic (congestive) and diastolic (congestive) heart failure: Secondary | ICD-10-CM | POA: Diagnosis not present

## 2016-02-09 ENCOUNTER — Other Ambulatory Visit: Payer: Self-pay

## 2016-02-09 DIAGNOSIS — Z4781 Encounter for orthopedic aftercare following surgical amputation: Secondary | ICD-10-CM | POA: Diagnosis not present

## 2016-02-09 DIAGNOSIS — F329 Major depressive disorder, single episode, unspecified: Secondary | ICD-10-CM | POA: Diagnosis not present

## 2016-02-09 DIAGNOSIS — I10 Essential (primary) hypertension: Secondary | ICD-10-CM | POA: Diagnosis not present

## 2016-02-09 DIAGNOSIS — E1142 Type 2 diabetes mellitus with diabetic polyneuropathy: Secondary | ICD-10-CM | POA: Diagnosis not present

## 2016-02-09 DIAGNOSIS — Z89511 Acquired absence of right leg below knee: Secondary | ICD-10-CM | POA: Diagnosis not present

## 2016-02-09 DIAGNOSIS — I480 Paroxysmal atrial fibrillation: Secondary | ICD-10-CM | POA: Diagnosis not present

## 2016-02-09 DIAGNOSIS — I5042 Chronic combined systolic (congestive) and diastolic (congestive) heart failure: Secondary | ICD-10-CM | POA: Diagnosis not present

## 2016-02-09 DIAGNOSIS — I429 Cardiomyopathy, unspecified: Secondary | ICD-10-CM | POA: Diagnosis not present

## 2016-02-09 DIAGNOSIS — E785 Hyperlipidemia, unspecified: Secondary | ICD-10-CM | POA: Diagnosis not present

## 2016-02-09 NOTE — Patient Outreach (Addendum)
02/09/16  Subjective: Mr. Golda is a 70 year old male who was referred to me for assistance with his insulins. He was recently hospitalized 01/17/16-02/03/16 for an amputation.  I was called on 02/03/16 due to him not being able to afford his Lantus and Novolog once he was discharged.  I was able to purchase his Novolog and Lantus with the Pharmacy Emergency Fund.  Today, I am calling him to follow up on a long term solution to help him afford his medications.  I was able to determine he has full Extra Help which means he pays $3.30 for his generic medications and $8.25 for his brand name.  If he using [employer], during the initial period, he will have a zero copay for his generic medications.  With mail order, he pays $3.30 or $8.25 for a 3 month supply instead of month, which will also save him money.  He stated he is going to have his provider switch all his medications to [facility] order.  He stated he would be able to afford these copays.    Objective: Outpatient Encounter Prescriptions as of 02/09/2016  Medication Sig  . Amino Acids-Protein Hydrolys (FEEDING SUPPLEMENT, PRO-STAT SUGAR FREE 64,) LIQD Take 30 mLs by mouth 2 (two) times daily.  Marland Kitchen amiodarone (PACERONE) 200 MG tablet Take 1 tablet (200 mg total) by mouth daily. One daily  . apixaban (ELIQUIS) 5 MG TABS tablet Take 1 tablet (5 mg total) by mouth 2 (two) times daily.  Marland Kitchen aspirin EC 81 MG tablet Take 1 tablet (81 mg total) by mouth daily.  . blood glucose meter kit and supplies Dispense based on patient and insurance preference. Use up to four times daily as directed. (FOR ICD-9 250.00, 250.01).  . carvedilol (COREG) 25 MG tablet Take 1 tablet (25 mg total) by mouth 2 (two) times daily with a meal.  . DHA-EPA-Coenzyme Q10-Vitamin E (CO Q-10 VITAMIN E FISH OIL PO) Take 1,000 mg by mouth 2 (two) times daily.  . furosemide (LASIX) 40 MG tablet Take 1 tablet (40 mg total) by mouth daily.  Marland Kitchen glucose blood (ACCU-CHEK SMARTVIEW) test  strip Use as instructed to check blood sugar 4 times per day dx code E11.9  . insulin aspart (NOVOLOG) 100 UNIT/ML injection Inject 3 Units into the skin 3 (three) times daily with meals.  . insulin glargine (LANTUS) 100 UNIT/ML injection Inject 0.27 mLs (27 Units total) into the skin at bedtime.  . Insulin Syringe-Needle U-100 (INSULIN SYRINGE .3CC/31GX5/16") 31G X 5/16" 0.3 ML MISC 1 each by Does not apply route 4 (four) times daily -  before meals and at bedtime.  Marland Kitchen lisinopril (PRINIVIL,ZESTRIL) 10 MG tablet Take 1 tablet (10 mg total) by mouth at bedtime.  . methocarbamol (ROBAXIN) 500 MG tablet Take 1 tablet (500 mg total) by mouth every 6 (six) hours as needed for muscle spasms.  . Multiple Vitamin (MULTIVITAMIN) tablet Take 1 tablet by mouth daily.  Marland Kitchen saccharomyces boulardii (FLORASTOR) 250 MG capsule Take 1 capsule (250 mg total) by mouth 2 (two) times daily.  . sildenafil (VIAGRA) 100 MG tablet Take 1 tablet (100 mg total) by mouth daily as needed for erectile dysfunction (1 hr prior to sexual activity).  Marland Kitchen atorvastatin (LIPITOR) 10 MG tablet Take 1 tablet (10 mg total) by mouth every morning. (Patient not taking: Reported on 02/09/2016)  . HYDROcodone-acetaminophen (NORCO/VICODIN) 5-325 MG tablet Take 1-2 tablets by mouth every 4 (four) hours as needed for moderate pain. (Patient not taking:  Reported on 02/09/2016)  . Insulin Pen Needle 31G X 5 MM MISC 1 each by Does not apply route 4 (four) times daily -  before meals and at bedtime. (Patient not taking: Reported on 02/09/2016)   No facility-administered encounter medications on file as of 02/09/2016.   Assessment:  Patient was recently discharged from hospital and all medications have been reviewed.  Drugs sorted by system:  Neurologic/Psychologic: none  Cardiovascular: Amiodarone, aspirin, atorvastatin, carvedilol, Eliquis, furosemide, lisinopril   Pulmonary/Allergy: none  Gastrointestinal: none  Endocrine: Lantus,  Novolog  Renal: none  Topical: none  Pain: Hydrocodone-Acetaminophen, methocarbamol  Vitamins/Minerals: Co Q-10, omega 3, vitamin E, Florastor, multivitamin  Infectious Diseases: none  Miscellaneous: Sildenafil   Duplications in therapy: none Gaps in therapy: None Medications to avoid in the elderly: Methocarbamol (anticholinergic effects, sedation, increase risk of fractures) Drug interactions: Eliquis and Aspirin (increase risk of bleeding) Other issues noted: none  Plan: 1.  I spoke to Mr. Espiritu about mail order and the money he can save since he has full Extra Help.  He is going to speak to Dr. Drema Dallas about sending all his medications to [facility].  2.  He has all his medications except atorvastatin which he will get once he gets a prescription sent to [place] since it is a zero co-pay.  3.  I will send a letter to Dr. Drema Dallas regarding his medications being sent to [facility] and medications to avoid in the elderly.   4.  I will close his case to pharmacy since all his pharmacy concerns have been met.  I am happy to assist in the future if he has any other pharmacy needs.   Deanne Coffer, PharmD, [place] [phone]

## 2016-02-10 DIAGNOSIS — Z4781 Encounter for orthopedic aftercare following surgical amputation: Secondary | ICD-10-CM | POA: Diagnosis not present

## 2016-02-10 DIAGNOSIS — F329 Major depressive disorder, single episode, unspecified: Secondary | ICD-10-CM | POA: Diagnosis not present

## 2016-02-10 DIAGNOSIS — E1142 Type 2 diabetes mellitus with diabetic polyneuropathy: Secondary | ICD-10-CM | POA: Diagnosis not present

## 2016-02-10 DIAGNOSIS — I5042 Chronic combined systolic (congestive) and diastolic (congestive) heart failure: Secondary | ICD-10-CM | POA: Diagnosis not present

## 2016-02-10 DIAGNOSIS — I429 Cardiomyopathy, unspecified: Secondary | ICD-10-CM | POA: Diagnosis not present

## 2016-02-10 DIAGNOSIS — E785 Hyperlipidemia, unspecified: Secondary | ICD-10-CM | POA: Diagnosis not present

## 2016-02-10 DIAGNOSIS — Z89511 Acquired absence of right leg below knee: Secondary | ICD-10-CM | POA: Diagnosis not present

## 2016-02-10 DIAGNOSIS — I10 Essential (primary) hypertension: Secondary | ICD-10-CM | POA: Diagnosis not present

## 2016-02-10 DIAGNOSIS — I480 Paroxysmal atrial fibrillation: Secondary | ICD-10-CM | POA: Diagnosis not present

## 2016-02-13 DIAGNOSIS — I10 Essential (primary) hypertension: Secondary | ICD-10-CM | POA: Diagnosis not present

## 2016-02-13 DIAGNOSIS — E785 Hyperlipidemia, unspecified: Secondary | ICD-10-CM | POA: Diagnosis not present

## 2016-02-13 DIAGNOSIS — F329 Major depressive disorder, single episode, unspecified: Secondary | ICD-10-CM | POA: Diagnosis not present

## 2016-02-13 DIAGNOSIS — I429 Cardiomyopathy, unspecified: Secondary | ICD-10-CM | POA: Diagnosis not present

## 2016-02-13 DIAGNOSIS — I5042 Chronic combined systolic (congestive) and diastolic (congestive) heart failure: Secondary | ICD-10-CM | POA: Diagnosis not present

## 2016-02-13 DIAGNOSIS — Z89511 Acquired absence of right leg below knee: Secondary | ICD-10-CM | POA: Diagnosis not present

## 2016-02-13 DIAGNOSIS — Z4781 Encounter for orthopedic aftercare following surgical amputation: Secondary | ICD-10-CM | POA: Diagnosis not present

## 2016-02-13 DIAGNOSIS — E1142 Type 2 diabetes mellitus with diabetic polyneuropathy: Secondary | ICD-10-CM | POA: Diagnosis not present

## 2016-02-13 DIAGNOSIS — I480 Paroxysmal atrial fibrillation: Secondary | ICD-10-CM | POA: Diagnosis not present

## 2016-02-14 DIAGNOSIS — E1142 Type 2 diabetes mellitus with diabetic polyneuropathy: Secondary | ICD-10-CM | POA: Diagnosis not present

## 2016-02-14 DIAGNOSIS — I480 Paroxysmal atrial fibrillation: Secondary | ICD-10-CM | POA: Diagnosis not present

## 2016-02-14 DIAGNOSIS — I429 Cardiomyopathy, unspecified: Secondary | ICD-10-CM | POA: Diagnosis not present

## 2016-02-14 DIAGNOSIS — I5042 Chronic combined systolic (congestive) and diastolic (congestive) heart failure: Secondary | ICD-10-CM | POA: Diagnosis not present

## 2016-02-14 DIAGNOSIS — F329 Major depressive disorder, single episode, unspecified: Secondary | ICD-10-CM | POA: Diagnosis not present

## 2016-02-14 DIAGNOSIS — Z89511 Acquired absence of right leg below knee: Secondary | ICD-10-CM | POA: Diagnosis not present

## 2016-02-14 DIAGNOSIS — E785 Hyperlipidemia, unspecified: Secondary | ICD-10-CM | POA: Diagnosis not present

## 2016-02-14 DIAGNOSIS — I10 Essential (primary) hypertension: Secondary | ICD-10-CM | POA: Diagnosis not present

## 2016-02-14 DIAGNOSIS — Z4781 Encounter for orthopedic aftercare following surgical amputation: Secondary | ICD-10-CM | POA: Diagnosis not present

## 2016-02-15 ENCOUNTER — Other Ambulatory Visit: Payer: Self-pay | Admitting: *Deleted

## 2016-02-15 ENCOUNTER — Other Ambulatory Visit: Payer: Self-pay

## 2016-02-15 DIAGNOSIS — Z89511 Acquired absence of right leg below knee: Secondary | ICD-10-CM | POA: Diagnosis not present

## 2016-02-15 DIAGNOSIS — I10 Essential (primary) hypertension: Secondary | ICD-10-CM | POA: Diagnosis not present

## 2016-02-15 DIAGNOSIS — I5042 Chronic combined systolic (congestive) and diastolic (congestive) heart failure: Secondary | ICD-10-CM | POA: Diagnosis not present

## 2016-02-15 DIAGNOSIS — E1142 Type 2 diabetes mellitus with diabetic polyneuropathy: Secondary | ICD-10-CM | POA: Diagnosis not present

## 2016-02-15 DIAGNOSIS — I429 Cardiomyopathy, unspecified: Secondary | ICD-10-CM | POA: Diagnosis not present

## 2016-02-15 DIAGNOSIS — Z4781 Encounter for orthopedic aftercare following surgical amputation: Secondary | ICD-10-CM | POA: Diagnosis not present

## 2016-02-15 DIAGNOSIS — E785 Hyperlipidemia, unspecified: Secondary | ICD-10-CM | POA: Diagnosis not present

## 2016-02-15 DIAGNOSIS — I480 Paroxysmal atrial fibrillation: Secondary | ICD-10-CM | POA: Diagnosis not present

## 2016-02-15 DIAGNOSIS — F329 Major depressive disorder, single episode, unspecified: Secondary | ICD-10-CM | POA: Diagnosis not present

## 2016-02-15 NOTE — Patient Outreach (Signed)
[place] [facility]) Care Management  02/15/2016  Thomas Wright [birth year] [id number]   Phone call to patient to follow up on status of his [name] application, well dime meals and any furher social work needs.  Per patient, he has not heard from [name] yet in regards to his face to face interview.  He has received the well-dine meals and is appreciative.  He has a shower chair, however it was recommended that it not be used due to safety.  He is working with physical therapy on getting a appropriate bath bench.  Per patient, his friends will be coming over this week to remove the doors to his bedroom and bathroom so that he can go in and out of these room in his wheelchair.  Per patient, once the weather clears for a few days, his landlord will be putting non-slip strips down for his wheelchair ramp.  Voicemail message left with Vilma Meckel with the [facility], Department of Transportation to follow up on patient's [name] application.  Plan:  This Education officer, museum will follow up with patient in approximately one month.   [name] [facility] Care Management [phone]

## 2016-02-15 NOTE — Patient Outreach (Signed)
[place] [facility]) Care Management  02/15/2016  AERO STANISLAW [birth year] [id number]   Patient and this RNCM made

## 2016-02-16 ENCOUNTER — Encounter: Payer: Self-pay | Admitting: Physical Medicine & Rehabilitation

## 2016-02-16 ENCOUNTER — Encounter
Payer: Commercial Managed Care - HMO | Attending: Physical Medicine & Rehabilitation | Admitting: Physical Medicine & Rehabilitation

## 2016-02-16 VITALS — BP 131/58 | HR 74 | Resp 14

## 2016-02-16 DIAGNOSIS — R52 Pain, unspecified: Secondary | ICD-10-CM | POA: Diagnosis not present

## 2016-02-16 DIAGNOSIS — I351 Nonrheumatic aortic (valve) insufficiency: Secondary | ICD-10-CM | POA: Diagnosis not present

## 2016-02-16 DIAGNOSIS — Z4781 Encounter for orthopedic aftercare following surgical amputation: Secondary | ICD-10-CM | POA: Diagnosis not present

## 2016-02-16 DIAGNOSIS — I110 Hypertensive heart disease with heart failure: Secondary | ICD-10-CM | POA: Insufficient documentation

## 2016-02-16 DIAGNOSIS — R269 Unspecified abnormalities of gait and mobility: Secondary | ICD-10-CM | POA: Insufficient documentation

## 2016-02-16 DIAGNOSIS — I480 Paroxysmal atrial fibrillation: Secondary | ICD-10-CM | POA: Insufficient documentation

## 2016-02-16 DIAGNOSIS — F329 Major depressive disorder, single episode, unspecified: Secondary | ICD-10-CM | POA: Diagnosis not present

## 2016-02-16 DIAGNOSIS — Z09 Encounter for follow-up examination after completed treatment for conditions other than malignant neoplasm: Secondary | ICD-10-CM | POA: Diagnosis not present

## 2016-02-16 DIAGNOSIS — Z89511 Acquired absence of right leg below knee: Secondary | ICD-10-CM | POA: Insufficient documentation

## 2016-02-16 DIAGNOSIS — I5042 Chronic combined systolic (congestive) and diastolic (congestive) heart failure: Secondary | ICD-10-CM | POA: Diagnosis not present

## 2016-02-16 DIAGNOSIS — I10 Essential (primary) hypertension: Secondary | ICD-10-CM

## 2016-02-16 DIAGNOSIS — E1142 Type 2 diabetes mellitus with diabetic polyneuropathy: Secondary | ICD-10-CM | POA: Diagnosis not present

## 2016-02-16 DIAGNOSIS — E785 Hyperlipidemia, unspecified: Secondary | ICD-10-CM | POA: Diagnosis not present

## 2016-02-16 DIAGNOSIS — N289 Disorder of kidney and ureter, unspecified: Secondary | ICD-10-CM | POA: Diagnosis not present

## 2016-02-16 DIAGNOSIS — I429 Cardiomyopathy, unspecified: Secondary | ICD-10-CM | POA: Diagnosis not present

## 2016-02-16 DIAGNOSIS — N170 Acute kidney failure with tubular necrosis: Secondary | ICD-10-CM

## 2016-02-16 DIAGNOSIS — Z952 Presence of prosthetic heart valve: Secondary | ICD-10-CM | POA: Insufficient documentation

## 2016-02-16 NOTE — Patient Outreach (Signed)
[place] [facility]) Care Management  02/16/2016  Thomas Wright 1946/02/11 [id number]   This RNCM met patient in his home for community care coordination. Patient's friend, Asencion Partridge was present and patient consented to her presence during this home viist. Patient reports Asencion Partridge came to visit and assist with his recovery.  Findings during this home visit. Patient lives in a single wide mobile home, very neat without infestations. Patient's friend, Asencion Partridge, assist patient with his ADLs and IADLs. Patient states Asencion Partridge drives him to appointments.  Asencion Partridge is from [place].  Patients states he feels he will be more mobile once he gets his prosthesis.  Patient has a right below the knee amputation. Patient states he has sufficient funds for co-payments for prescription and medical appointments. Patient states he can afford food and shelter.   Patient has limited social support. Patient has a very good understanding of his medications  Patient and RNCM collaborated to form his case management care plan, updated to include diabetes education related to HgA1C.  His is currently 12.6 on April 26.   Patient and this RNCM agreed to home visit next week for Diabetes Education

## 2016-02-16 NOTE — Progress Notes (Signed)
Subjective:    Patient ID: Thomas Wright, male    DOB: 12-Sep-1946, 70 y.o.   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   DATE OF ADMISSION:  01/17/2016 DATE OF DISCHARGE:  02/03/2016 Since discharge pt states he has been doing fairly well.  He is in the process of having accommodations made to his house to allow wheelchair access to the bathroom.  Pt saw Dr. Sharol Given and his staples have been removed.  He has not seen Nephrology.  He sees his PCP tomorrow.  Pain has resolved.  He is taking his DM meds, staying below 180.    DME: Bedside commode, hospital bed, wheelchair Mobility: Wheelchair for mobility Therapies: 4/week.   Pain Inventory Average Pain 2 Pain Right Now 1 My pain is sharp and tingling  In the last 24 hours, has pain interfered with the following? General activity 1 Relation with others 1 Enjoyment of life 1 What TIME of day is your pain at its worst? no selection Sleep (in general) Fair  Pain is worse with: no selection Pain improves with: no selection Relief from Meds: no selection  Mobility use a wheelchair  Function disabled: date disabled . I need assistance with the following:  meal prep, household duties and shopping  Neuro/Psych tingling  Prior Studies hospital f/u  Physicians involved in your care hospital f/u   Family History  Problem Relation Age of Onset  . Colon cancer Father   . Diabetes Father   . Liver disease Brother   . Heart murmur Child   . Congenital heart disease Other   . Diabetes Maternal Grandmother   . Heart disease Maternal Grandfather     A Fib   Social History   Social History  . Marital Status: Divorced    Spouse Name: N/A  . Number of Children: N/A  .  Years of Education: N/A   Occupational History  . retired     Social History Main Topics  . Smoking status: Never Smoker   . Smokeless tobacco: Never Used  . Alcohol Use: 0.0 oz/week    0 Standard drinks or equivalent per week     Comment: 04/19/2015 "might have a beer or 2, 2-3 times/yr"  . Drug Use: No  . Sexual Activity: No   Other Topics Concern  . None   Social History Narrative   Past Surgical History  Procedure Laterality Date  . Aortic valve replacement  05/2007    with tissue graft  . I&d extremity Right 05/05/2014    Procedure: IRRIGATION AND DEBRIDEMENT EXTREMITY;  Surgeon: [name], MD;  Location: [place];  Service: Orthopedics;  Laterality: Right;  . Tee without cardioversion N/A 07/23/2014    Procedure: TRANSESOPHAGEAL ECHOCARDIOGRAM (TEE);  Surgeon: Josue Hector, MD;  Location: [facility] ENDOSCOPY;  Service: Cardiovascular;  Laterality: N/A;  . Cardioversion N/A 07/23/2014    Procedure: CARDIOVERSION;  Surgeon: Josue Hector, MD;  Location: [facility] ENDOSCOPY;  Service: Cardiovascular;  Laterality: N/A;  . Tee without cardioversion N/A 09/01/2014    Procedure: TRANSESOPHAGEAL ECHOCARDIOGRAM (TEE);  Surgeon: [name], MD;  Location: [facility] ENDOSCOPY;  Service: Cardiovascular;  Laterality: N/A;  . Cardioversion N/A 09/01/2014    Procedure: CARDIOVERSION;  Surgeon: [name], MD;  Location: [place];  Service: Cardiovascular;  Laterality: N/A;  .  Amputation Right 07/29/2014    Procedure: AMPUTATION RIGHT LONG FINGER;  Surgeon: [name], MD;  Location: [place];  Service: Orthopedics;  Laterality: Right;  . Cardiac valve replacement    . Tonsillectomy  1954  . Cardiac catheterization  "several"  . Amputation Right 01/13/2016    Procedure: RIGHT BELOW KNEE AMPUTATION;  Surgeon: Newt Minion, MD;  Location: [place];  Service: Orthopedics;  Laterality: Right;  . Application of wound vac Right 01/13/2016    Procedure: APPLICATION OF WOUND VAC;  Surgeon: Newt Minion, MD;   Location: [place];  Service: Orthopedics;  Laterality: Right;   Past Medical History  Diagnosis Date  . Hypertension   . PAF (paroxysmal atrial fibrillation) ([place])     a. Postop 2008. b. recurrent afib 08/2014 with LAA clot noted on TEE. EF was down again at 25%. He was anticoagulated with Eliquis and placed on Amiodarone.He ultimately underwent TEE-DCCV 08/2014.  Marland Kitchen Hypertriglyceridemia   . Cardiomyopathy ([place])     EF originally 20%-now 45%, no CAD on cath  . Chronic combined systolic and diastolic CHF (congestive heart failure) ([place])     a. 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 Reported h/o cath in 2008 without significant CAD.  Marland Kitchen Type II diabetes mellitus ([place])   . Arthritis     .  Marland Kitchen Depression     "a little right now" (04/19/2015)  . S/P aortic valve replacement with bioprosthetic valve     a. bioprosthetic AVR in 05/2007 in [place].  . Hyperlipidemia    BP 131/58 mmHg  Pulse 74  Resp 14  SpO2 98%  Opioid Risk Score:   Fall Risk Score:  `1  Depression screen PHQ 2/9  Depression screen [facility] 2/9 02/16/2016 01/04/2016 11/18/2015  Decreased Interest 0 0 0  Down, Depressed, Hopeless 1 0 0  PHQ - 2 Score 1 0 0  Altered sleeping 0 - -  Tired, decreased energy 0 - -  Change in appetite 0 - -  Feeling bad or failure about yourself  1 - -  Trouble concentrating 0 - -  Moving slowly or fidgety/restless 0 - -  Suicidal thoughts 0 - -  PHQ-9 Score 2 - -  Difficult doing work/chores Somewhat difficult - -     Review of Systems     Objective:   Physical Exam Constitutional: He appears well-developed. Obese. NAD HENT: Normocephalic and atraumatic.   Eyes: Conjunctivae and EOM are normal.   Cardiovascular: Normal rate and regular rhythm.  no murmurs   Respiratory: Effort normal and breath sounds normal. No respiratory distress. No wheezes or rales. GI: Soft. Bowel sounds are normal. He exhibits no  distension.  Musculoskeletal: He exhibits No tenderness. +1 edema left foot (improving).  Neurological: He is alert and oriented.  Motor: Bilateral upper extremity 5/5 proximal to distal Left lower extremity hip flexion, knee extension 4+/5, ankle dorsi/plantar flexion 4+/5 Right lower extremity hip flexion, Knee extension: 5/5  Skin:  BKA intact, no evidence of drainage currently Psychiatric: He has a normal mood and affect. 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    1.  S/p right below-knee amputation on 01/13/2016  No pain  Cont therapies  Cont shrinker  2. Pain: Resolved  3. Diabetes mellitus with peripheral neuropathy  Cont meds  Follow up with PCP for ambulatory adjustments.   4. Hypertension  Within acceptable range in the hospital  Cont meds  5. Chronic combined systolic and diastolic congestive heart failure.    Cont lasix  Follow up with Cardiology  6. Acute renal insufficiency/presumably ischemic ATN.  Follow up with PCP for routine lab work  7. Abnormality of gait  Cont wheelchair for safety, advance to walker when tolerated with therapies   Referrals reviewed: pt to make appt with Nephrology and PCP Meds reviewed All questions answered

## 2016-02-17 DIAGNOSIS — E1142 Type 2 diabetes mellitus with diabetic polyneuropathy: Secondary | ICD-10-CM | POA: Diagnosis not present

## 2016-02-17 DIAGNOSIS — I429 Cardiomyopathy, unspecified: Secondary | ICD-10-CM | POA: Diagnosis not present

## 2016-02-17 DIAGNOSIS — Z89511 Acquired absence of right leg below knee: Secondary | ICD-10-CM | POA: Diagnosis not present

## 2016-02-17 DIAGNOSIS — I480 Paroxysmal atrial fibrillation: Secondary | ICD-10-CM | POA: Diagnosis not present

## 2016-02-17 DIAGNOSIS — Z4781 Encounter for orthopedic aftercare following surgical amputation: Secondary | ICD-10-CM | POA: Diagnosis not present

## 2016-02-17 DIAGNOSIS — I5042 Chronic combined systolic (congestive) and diastolic (congestive) heart failure: Secondary | ICD-10-CM | POA: Diagnosis not present

## 2016-02-17 DIAGNOSIS — I10 Essential (primary) hypertension: Secondary | ICD-10-CM | POA: Diagnosis not present

## 2016-02-17 DIAGNOSIS — F329 Major depressive disorder, single episode, unspecified: Secondary | ICD-10-CM | POA: Diagnosis not present

## 2016-02-17 DIAGNOSIS — E785 Hyperlipidemia, unspecified: Secondary | ICD-10-CM | POA: Diagnosis not present

## 2016-02-21 ENCOUNTER — Other Ambulatory Visit: Payer: Self-pay

## 2016-02-21 DIAGNOSIS — E1165 Type 2 diabetes mellitus with hyperglycemia: Secondary | ICD-10-CM | POA: Diagnosis not present

## 2016-02-21 DIAGNOSIS — Z89511 Acquired absence of right leg below knee: Secondary | ICD-10-CM | POA: Diagnosis not present

## 2016-02-21 DIAGNOSIS — N179 Acute kidney failure, unspecified: Secondary | ICD-10-CM | POA: Diagnosis not present

## 2016-02-21 DIAGNOSIS — Z794 Long term (current) use of insulin: Secondary | ICD-10-CM | POA: Diagnosis not present

## 2016-02-21 DIAGNOSIS — D649 Anemia, unspecified: Secondary | ICD-10-CM | POA: Diagnosis not present

## 2016-02-21 NOTE — Patient Outreach (Addendum)
Unsuccessful attempt made to contact patient via tlepehone for assessment of chronic disease management. Will make another attempt to contact patient on Wednesday, June 7

## 2016-02-22 ENCOUNTER — Other Ambulatory Visit: Payer: Self-pay

## 2016-02-22 DIAGNOSIS — I5042 Chronic combined systolic (congestive) and diastolic (congestive) heart failure: Secondary | ICD-10-CM | POA: Diagnosis not present

## 2016-02-22 DIAGNOSIS — Z4781 Encounter for orthopedic aftercare following surgical amputation: Secondary | ICD-10-CM | POA: Diagnosis not present

## 2016-02-22 DIAGNOSIS — F329 Major depressive disorder, single episode, unspecified: Secondary | ICD-10-CM | POA: Diagnosis not present

## 2016-02-22 DIAGNOSIS — I10 Essential (primary) hypertension: Secondary | ICD-10-CM | POA: Diagnosis not present

## 2016-02-22 DIAGNOSIS — E785 Hyperlipidemia, unspecified: Secondary | ICD-10-CM | POA: Diagnosis not present

## 2016-02-22 DIAGNOSIS — I480 Paroxysmal atrial fibrillation: Secondary | ICD-10-CM | POA: Diagnosis not present

## 2016-02-22 DIAGNOSIS — Z89511 Acquired absence of right leg below knee: Secondary | ICD-10-CM | POA: Diagnosis not present

## 2016-02-22 DIAGNOSIS — E1142 Type 2 diabetes mellitus with diabetic polyneuropathy: Secondary | ICD-10-CM | POA: Diagnosis not present

## 2016-02-22 DIAGNOSIS — I429 Cardiomyopathy, unspecified: Secondary | ICD-10-CM | POA: Diagnosis not present

## 2016-02-22 NOTE — Patient Outreach (Signed)
Brief call to patient to confirm home visit for tomorrow. Patient identified himself by providing his date of birth and address.  Patient confirms availability for home visit tomorrow.

## 2016-02-23 ENCOUNTER — Other Ambulatory Visit: Payer: Self-pay

## 2016-02-23 DIAGNOSIS — Z4781 Encounter for orthopedic aftercare following surgical amputation: Secondary | ICD-10-CM | POA: Diagnosis not present

## 2016-02-23 DIAGNOSIS — F329 Major depressive disorder, single episode, unspecified: Secondary | ICD-10-CM | POA: Diagnosis not present

## 2016-02-23 DIAGNOSIS — E785 Hyperlipidemia, unspecified: Secondary | ICD-10-CM | POA: Diagnosis not present

## 2016-02-23 DIAGNOSIS — E1142 Type 2 diabetes mellitus with diabetic polyneuropathy: Secondary | ICD-10-CM | POA: Diagnosis not present

## 2016-02-23 DIAGNOSIS — Z89511 Acquired absence of right leg below knee: Secondary | ICD-10-CM | POA: Diagnosis not present

## 2016-02-23 DIAGNOSIS — I10 Essential (primary) hypertension: Secondary | ICD-10-CM | POA: Diagnosis not present

## 2016-02-23 DIAGNOSIS — I5042 Chronic combined systolic (congestive) and diastolic (congestive) heart failure: Secondary | ICD-10-CM | POA: Diagnosis not present

## 2016-02-23 DIAGNOSIS — I480 Paroxysmal atrial fibrillation: Secondary | ICD-10-CM | POA: Diagnosis not present

## 2016-02-23 DIAGNOSIS — I429 Cardiomyopathy, unspecified: Secondary | ICD-10-CM | POA: Diagnosis not present

## 2016-02-23 NOTE — Patient Outreach (Signed)
[place] [facility]) Care Management  02/23/2016  Thomas Wright Aug 15, 1946 [id number]   This RNCM met with patient and his friend, Thomas Wright, who patient consented to be present during this home visit. Patient reports having fasting blood glucose levels in the 100s to 120s and 150s to 220s 2 hours post meals.  Patient reports progression in healing of his stump, plan first fitting for prosthesis later this month. Patient also reports compliance with medication regimen. Patient and this RNCM reviewed his case management care plan and updated.   Patient and this RNCM discussed discharge since he reports no further case management needs at this time. Patient states his friend, Thomas Wright, would stay at his home longer to assist him with ADLs, IADLs and trasnportation.  Patient reminded of 24/7 Nurse Call Line for use if needed. Patient also advised that if he has further need for community care coordination, he could self refer by calling [facility] primary number and/or get any of his medical providers to refer.   Plan: Discharge patient from caseload  Send patient and primary care physician case closure letter

## 2016-02-24 DIAGNOSIS — E785 Hyperlipidemia, unspecified: Secondary | ICD-10-CM | POA: Diagnosis not present

## 2016-02-24 DIAGNOSIS — I10 Essential (primary) hypertension: Secondary | ICD-10-CM | POA: Diagnosis not present

## 2016-02-24 DIAGNOSIS — I5042 Chronic combined systolic (congestive) and diastolic (congestive) heart failure: Secondary | ICD-10-CM | POA: Diagnosis not present

## 2016-02-24 DIAGNOSIS — F329 Major depressive disorder, single episode, unspecified: Secondary | ICD-10-CM | POA: Diagnosis not present

## 2016-02-24 DIAGNOSIS — Z4781 Encounter for orthopedic aftercare following surgical amputation: Secondary | ICD-10-CM | POA: Diagnosis not present

## 2016-02-24 DIAGNOSIS — I480 Paroxysmal atrial fibrillation: Secondary | ICD-10-CM | POA: Diagnosis not present

## 2016-02-24 DIAGNOSIS — I429 Cardiomyopathy, unspecified: Secondary | ICD-10-CM | POA: Diagnosis not present

## 2016-02-24 DIAGNOSIS — E1142 Type 2 diabetes mellitus with diabetic polyneuropathy: Secondary | ICD-10-CM | POA: Diagnosis not present

## 2016-02-24 DIAGNOSIS — Z89511 Acquired absence of right leg below knee: Secondary | ICD-10-CM | POA: Diagnosis not present

## 2016-02-26 ENCOUNTER — Other Ambulatory Visit: Payer: Self-pay | Admitting: Cardiology

## 2016-02-27 ENCOUNTER — Other Ambulatory Visit: Payer: Self-pay

## 2016-02-27 DIAGNOSIS — Z4781 Encounter for orthopedic aftercare following surgical amputation: Secondary | ICD-10-CM | POA: Diagnosis not present

## 2016-02-27 DIAGNOSIS — F329 Major depressive disorder, single episode, unspecified: Secondary | ICD-10-CM | POA: Diagnosis not present

## 2016-02-27 DIAGNOSIS — I480 Paroxysmal atrial fibrillation: Secondary | ICD-10-CM | POA: Diagnosis not present

## 2016-02-27 DIAGNOSIS — Z89511 Acquired absence of right leg below knee: Secondary | ICD-10-CM | POA: Diagnosis not present

## 2016-02-27 DIAGNOSIS — E785 Hyperlipidemia, unspecified: Secondary | ICD-10-CM | POA: Diagnosis not present

## 2016-02-27 DIAGNOSIS — E1142 Type 2 diabetes mellitus with diabetic polyneuropathy: Secondary | ICD-10-CM | POA: Diagnosis not present

## 2016-02-27 DIAGNOSIS — I429 Cardiomyopathy, unspecified: Secondary | ICD-10-CM | POA: Diagnosis not present

## 2016-02-27 DIAGNOSIS — I5042 Chronic combined systolic (congestive) and diastolic (congestive) heart failure: Secondary | ICD-10-CM | POA: Diagnosis not present

## 2016-02-27 DIAGNOSIS — I10 Essential (primary) hypertension: Secondary | ICD-10-CM | POA: Diagnosis not present

## 2016-02-27 NOTE — Patient Outreach (Signed)
I returned Mr. Lasseter phone call.  He verified his information.  He stated he needed his insulin prescriptions sent to [place].  He stated Dr. Dwyane Dee is his prescribing physician.  I stated I would send a note to Dr. Dwyane Dee.  I have done this through inbasket.  I asked Mr. Radigan to call me by the end of the week if he has not heard anything about his insulins.  He stated he has enough to last him through the end of the week.    Deanne Coffer, PharmD, [place] [phone]

## 2016-02-29 ENCOUNTER — Encounter: Payer: Self-pay | Admitting: Cardiology

## 2016-02-29 ENCOUNTER — Ambulatory Visit (INDEPENDENT_AMBULATORY_CARE_PROVIDER_SITE_OTHER): Payer: Commercial Managed Care - HMO | Admitting: Cardiology

## 2016-02-29 VITALS — BP 114/58 | HR 71

## 2016-02-29 DIAGNOSIS — I5032 Chronic diastolic (congestive) heart failure: Secondary | ICD-10-CM

## 2016-02-29 DIAGNOSIS — Z79899 Other long term (current) drug therapy: Secondary | ICD-10-CM | POA: Diagnosis not present

## 2016-02-29 DIAGNOSIS — E669 Obesity, unspecified: Secondary | ICD-10-CM

## 2016-02-29 DIAGNOSIS — Z954 Presence of other heart-valve replacement: Secondary | ICD-10-CM

## 2016-02-29 DIAGNOSIS — E161 Other hypoglycemia: Secondary | ICD-10-CM | POA: Diagnosis not present

## 2016-02-29 DIAGNOSIS — I10 Essential (primary) hypertension: Secondary | ICD-10-CM | POA: Diagnosis not present

## 2016-02-29 DIAGNOSIS — I480 Paroxysmal atrial fibrillation: Secondary | ICD-10-CM

## 2016-02-29 DIAGNOSIS — I5042 Chronic combined systolic (congestive) and diastolic (congestive) heart failure: Secondary | ICD-10-CM | POA: Diagnosis not present

## 2016-02-29 DIAGNOSIS — Z7901 Long term (current) use of anticoagulants: Secondary | ICD-10-CM | POA: Diagnosis not present

## 2016-02-29 DIAGNOSIS — E785 Hyperlipidemia, unspecified: Secondary | ICD-10-CM | POA: Diagnosis not present

## 2016-02-29 DIAGNOSIS — Z89511 Acquired absence of right leg below knee: Secondary | ICD-10-CM | POA: Diagnosis not present

## 2016-02-29 DIAGNOSIS — I429 Cardiomyopathy, unspecified: Secondary | ICD-10-CM | POA: Diagnosis not present

## 2016-02-29 DIAGNOSIS — Z4781 Encounter for orthopedic aftercare following surgical amputation: Secondary | ICD-10-CM | POA: Diagnosis not present

## 2016-02-29 DIAGNOSIS — E162 Hypoglycemia, unspecified: Secondary | ICD-10-CM | POA: Diagnosis not present

## 2016-02-29 DIAGNOSIS — E1142 Type 2 diabetes mellitus with diabetic polyneuropathy: Secondary | ICD-10-CM | POA: Diagnosis not present

## 2016-02-29 DIAGNOSIS — F329 Major depressive disorder, single episode, unspecified: Secondary | ICD-10-CM | POA: Diagnosis not present

## 2016-02-29 MED ORDER — AMIODARONE HCL 100 MG PO TABS: 100.0000 mg | ORAL_TABLET | Freq: Every day | ORAL | Status: DC

## 2016-02-29 NOTE — Progress Notes (Signed)
Cardiology Office Note   Date:  02/29/2016   ID:  Thomas Wright, DOB 04-24-1946, MRN [id number]  PCP:  [name], MD  Cardiologist:   [name], MD       History of Present Illness: Thomas Wright is a 70 y.o. 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 Interestingly, his BNP was normal. No changes on chest CT scan to suggest amiodarone toxicity. He was down 3.4 L at discharge and pulmonary function studies demonstrated moderate to severe restriction consistent with fibrosis or interstitial inflammations. Pulmonary referral was made.  Amputation BKA. Dr. Sela Hua cut at home which then led to gangrene   I reviewed pulmonary note. There was significant exposure in the 60s and 70s to coal dust. 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 Autoimmune serologies were drawn. Sedimentation rate was normal at 11.  Previously he had battled with hypotension at home SBPs 50s-60s.  Lightheaded but no syncope, chest pain, palpitations, dyspnea.  He has increased his Lasix to '20mg'$  BID previously.  He called in and was advised to decrease Coreg to 6.'25mg'$  BID.  He has only been taking this when SBP > 125 and DBP > 85 he takes Coreg.  He has only been taking Coreg twice daily because BPs have been mostly lower than this.    His weight has fluctuated.  He reports abdominal distention but denies PND or LE edema.  He has stable 2 pillow orthopnea.  He reports early satiety in past year.  He doubles his Lasix about twice weekly when he feels his fluid is up (when it is difficult to button his pants).    Has not been walking as much due to DM ulcer on bottom of foot.  No fever/chills or  systemic symptoms to suggest infection.  Recent MRI negative for osteo.  He is following with wound specialist for this.  He also called with minor blood streaking on toilet paper. Hemoglobin has ranged from 9.3 to 11.2.  Aortic valve was placed in 05/2007 with a 29 mm bioprosthetic Edwards valve. This was performed in [place]. At the time his ejection fraction was 30% on cardiac catheterization however current ejection fraction appears to be 45% from 12/24/11 with aortic root dilatation of 4.5 cm.  He has been on amiodarone since his surgery. Previously had stopped it in 2013. 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 His atrial fibrillation came with a finger infection. Remember uncontrolled diabetes. He has had history as well of diabetic ketoacidosis, perirectal abscess.   Past Medical History  Diagnosis Date  . Hypertension   . PAF (paroxysmal atrial fibrillation) ([place])     a. Postop 2008. b. recurrent afib 08/2014 with LAA clot noted on TEE. EF was down again at 25%. He was anticoagulated with Eliquis and placed on Amiodarone.He ultimately underwent TEE-DCCV 08/2014.  Marland Kitchen Hypertriglyceridemia   . Cardiomyopathy ([place])     EF originally 20%-now 45%, no CAD on cath  . Chronic combined systolic and diastolic CHF (congestive heart failure) ([place])     a. Fluctuating EF - 30% at time of AVR, 45% in 2013, and 55-60% after restoration of NSR in 11/2014 - suspected  NICM. Reported h/o cath in 2008 without significant CAD.  Marland Kitchen Type II diabetes mellitus ([place])   . Arthritis     .  Marland Kitchen Depression     "a little right now" (04/19/2015)  . S/P aortic valve replacement with bioprosthetic valve     a. bioprosthetic AVR in 05/2007 in [place].  . Hyperlipidemia     Past Surgical History  Procedure Laterality Date  . Aortic valve replacement  05/2007    with tissue graft  . I&d extremity Right  05/05/2014    Procedure: IRRIGATION AND DEBRIDEMENT EXTREMITY;  Surgeon: [name], MD;  Location: [place];  Service: Orthopedics;  Laterality: Right;  . Tee without cardioversion N/A 07/23/2014    Procedure: TRANSESOPHAGEAL ECHOCARDIOGRAM (TEE);  Surgeon: Josue Hector, MD;  Location: [facility] ENDOSCOPY;  Service: Cardiovascular;  Laterality: N/A;  . Cardioversion N/A 07/23/2014    Procedure: CARDIOVERSION;  Surgeon: Josue Hector, MD;  Location: [facility] ENDOSCOPY;  Service: Cardiovascular;  Laterality: N/A;  . Tee without cardioversion N/A 09/01/2014    Procedure: TRANSESOPHAGEAL ECHOCARDIOGRAM (TEE);  Surgeon: [name], MD;  Location[facility] ENDOSCOPY;  Service: Cardiovascular;  Laterality: N/A;  . Cardioversion N/A 09/01/2014    Procedure: CARDIOVERSION;  Surgeon: [name], MD;  Location: [facility] ENDOSCOPY;  Service: Cardiovascular;  Laterality: N/A;  . Amputation Right 07/29/2014    Procedure: AMPUTATION RIGHT LONG FINGER;  Surgeon: [name], MD;  Location: [place];  Service: Orthopedics;  Laterality: Right;  . Cardiac valve replacement    . Tonsillectomy  1954  . Cardiac catheterization  "several"  . Amputation Right 01/13/2016    Procedure: RIGHT BELOW KNEE AMPUTATION;  Surgeon: Newt Minion, MD;  Location: [place];  Service: Orthopedics;  Laterality: Right;  . Application of wound vac Right 01/13/2016    Procedure: APPLICATION OF WOUND VAC;  Surgeon: Newt Minion, MD;  Location: [place];  Service: Orthopedics;  Laterality: Right;     Current Outpatient Prescriptions  Medication Sig Dispense Refill  . amiodarone (PACERONE) 100 MG tablet Take 1 tablet (100 mg total) by mouth daily. 90 tablet 3  . apixaban (ELIQUIS) 5 MG TABS tablet Take 1 tablet (5 mg total) by mouth 2 (two) times daily. 60 tablet 3  . atorvastatin (LIPITOR) 10 MG tablet Take 1 tablet (10 mg total) by mouth every morning. 90 tablet 3  . blood glucose meter kit and supplies Dispense based on patient and insurance preference.  Use up to four times daily as directed. (FOR ICD-9 250.00, 250.01). 1 each 0  . carvedilol (COREG) 6.25 MG tablet Take 6.25 mg by mouth 2 (two) times daily with a meal.    . DHA-EPA-Coenzyme Q10-Vitamin E (CO Q-10 VITAMIN E FISH OIL PO) Take 1,000 mg by mouth 2 (two) times daily.    . furosemide (LASIX) 40 MG tablet Take 1 tablet (40 mg total) by mouth daily. 30 tablet 1  . glucose blood (ACCU-CHEK SMARTVIEW) test strip Use as instructed to check blood sugar 4 times per day dx code E11.9 400 each 1  . insulin aspart (NOVOLOG) 100 UNIT/ML injection Inject 3 Units into the skin 3 (three) times daily with meals. 10 mL 11  . insulin glargine (LANTUS) 100 UNIT/ML injection Inject 0.27 mLs (27 Units total) into the skin at bedtime. 10 mL 11  . Insulin Pen Needle 31G X 5 MM MISC 1 each by Does not apply route 4 (four) times daily -  before meals and at bedtime. 300 each 0  .  Insulin Syringe-Needle U-100 (INSULIN SYRINGE .3CC/31GX5/16") 31G X 5/16" 0.3 ML MISC 1 each by Does not apply route 4 (four) times daily -  before meals and at bedtime. 300 each 0  . lisinopril (PRINIVIL,ZESTRIL) 10 MG tablet Take 1 tablet (10 mg total) by mouth at bedtime. 30 tablet 1  . Multiple Vitamin (MULTIVITAMIN) tablet Take 1 tablet by mouth daily.    . sildenafil (VIAGRA) 100 MG tablet Take 1 tablet (100 mg total) by mouth daily as needed for erectile dysfunction (1 hr prior to sexual activity). 6 tablet 12   No current facility-administered medications for this visit.    Allergies:   Amoxicill-clarithro-omeprazole    Social History:  The patient  reports that he has never smoked. He has never used smokeless tobacco. He reports that he drinks alcohol. He reports that he does not use illicit drugs.   Family History:  The patient's family history includes Colon cancer in his father; Congenital heart disease in his other; Diabetes in his father and maternal grandmother; Heart disease in his maternal grandfather; Heart murmur  in his child; Liver disease in his brother.    ROS:  Please see the history of present illness.   Otherwise, review of systems are positive for none.   All other systems are reviewed and negative.    PHYSICAL EXAM: VS:  BP 114/58 mmHg  Pulse 71 , BMI There is no weight on file to calculate BMI. GEN: Well nourished, well developed, in no acute distress HEENT: normal Neck: no JVD, carotid bruits, or masses Cardiac: RRR; no murmurs, rubs, or gallops,no edema  Respiratory:  clear to auscultation bilaterally, normal work of breathing GI: soft, nontender, mild distended, + BS MS: no deformity or atrophy Skin: warm and dry, no rash Neuro:  Grossly intact Psych: euthymic mood, full affect   EKG:  EKG is not ordered today.    Recent Labs: 04/19/2015: B Natriuretic Peptide 65.8 05/10/2015: TSH 2.24 01/12/2016: Magnesium 1.7 01/18/2016: ALT 13* 01/30/2016: Hemoglobin 9.3*; Platelets 320 02/03/2016: BUN 22*; Creatinine, Ser 1.25*; Potassium 4.1; Sodium 138    Lipid Panel    Component Value Date/Time   CHOL 127 05/24/2015 0848   TRIG 123.0 05/24/2015 0848   HDL 38.70* 05/24/2015 0848   CHOLHDL 3 05/24/2015 0848   VLDL 24.6 05/24/2015 0848   LDLCALC 64 05/24/2015 0848      Wt Readings from Last 3 Encounters:  02/03/16 227 lb 1.2 oz (103 kg)  01/17/16 246 lb 4.8 oz (111.721 kg)  01/04/16 235 lb (106.595 kg)      Other studies Reviewed: Additional studies/ records that were reviewed today include: labs, radiology, office notes. Review of the above records demonstrates: as above   ECHO 07/22/14 - EF 25-30% (setting of AFIB RVR)  ECHO 12/08/13: - 45% to 50%.  - Aortic valve: A bioprosthesis was present. Trivial regurgitation. - Aortic root: The aortic root was moderately dilated. - Mitral valve: Calcified annulus. - Left atrium: The atrium was moderately dilated. Impressions:  ECHO 04/19/15: - Left ventricle: The cavity size was normal. Systolic function was  normal. The  estimated ejection fraction was in the range of 50%  to 55%. Wall motion was normal; there were no regional wall  motion abnormalities. Doppler parameters are consistent with  abnormal left ventricular relaxation (grade 1 diastolic  dysfunction). Doppler parameters are consistent with elevated  ventricular end-diastolic filling pressure. - Ventricular septum: Septal motion showed paradox. - Aortic valve: Bioprosthetic valve sits well in the aortic  position. There is mild central aortic regurgitation and no  paravalvular leak. Normal transaortic gradients. Transvalvular  velocity was within the normal range. There was no stenosis.  There was mild regurgitation. Valve area (VTI): 3.58 cm^2. Valve  area (Vmax): 3.57 cm^2. Valve area (Vmean): 3.24 cm^2. - Mitral valve: Calcified annulus. Mildly thickened leaflets .  There was mild regurgitation. Valve area by continuity equation  (using LVOT flow): 4 cm^2. - Left atrium: The atrium was mildly dilated. - Right ventricle: The cavity size was mildly dilated. Wall  thickness was normal. - Right atrium: The atrium was mildly dilated. - Pulmonary arteries: Systolic pressure was within the normal  range.  Impressions:  - There is no significant difference when compared tio the prior  study from 11/22/2014.  CTA 12/14/13 -  Mild dilatation of the proximal aorta at the sinuses of Valsalva, measuring 4.2 cm in estimated diameter. The rest of the thoracic aorta is of normal caliber. No dissection is identified. Scattered calcified plaque is identified in the distribution of the LAD.   Nuclear stress test 11/2012-no ischemia, ejection fraction 42%  ASSESSMENT AND PLAN:  1.  Chronic diastolic heart failure-No coronary artery disease on catheterization. Ejection fraction on 04/2015 echocardiogram was now low normal 50%. Previously in the setting of atrial fibrillation with rapid ventricular response, ejection fraction was as low as  20-25%. interestingly, his dyspnea did not change all that much in the hospital after diuresing 3-4 L. His BNP on admission was actually normal. Weight gain is the only indicator of potential excess fluid. However, he has no other S&S of HF.  We will continue his Lasix. He is unsure if hypotension occurred with increased Lasix so will continue with daily Lasix but he can increase to BID prn.  Continue lower dose of Coreg 6.'25mg'$  daily since he feels better with this change.   2. Paroxysmal atrial fibrillation, nonvalvular- In NSR. Decrease amiodarone from '200mg'$  daily to 100 daily. Last episode of atrial fibrillation occurred in 2015 in the setting of finger infection. At that time, cardioversion was delayed because of possible left atrial appendage thrombus. Back in 2013, his amiodarone was stopped because of several years of normal sinus rhythm detected postoperatively, bioprosthetic aortic valve replacement. Pulmonary did not feel as though his CT findings or lab work (ESR normal) were indicative of amiodarone toxicity causing his dyspnea. We will continue. He does well in normal rhythm. LFT, normal. Checking TSH. I asked him when Dr. Drema Dallas checks blood work to check LFT and TSH given amiodarone.  3. Obesity-continue to encourage weight loss.   4. 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 Due in 04/2016  5. 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  6. Difficult to control diabetes-has had perirectal abscess, foot ulcer, seen endocrinology.  7. Bioprosthetic aortic valve-replaced in 2009.   8. Chronic anticoagulation-Eliquis 5 mg twice a day. Given his hemoglobin of 9.3, we will discontinue his aspirin '81mg'$ . Continue with Eliquis only.       Current medicines are reviewed at length with the patient today.  The patient does not have concerns regarding medicines.  The following changes have been made:  Decrease amiodarone  Labs/ tests ordered  today include:   Orders Placed This Encounter  Procedures  . TSH     Disposition:   FU with Shalunda Lindh in 6 months  Signed, [name], MD  02/29/2016 9:43 AM    [facility] [place],  [place]  [address] Phone: [phone]; Fax: [phone]

## 2016-02-29 NOTE — Patient Instructions (Signed)
Medication Instructions:  Your physician has recommended you make the following change in your medication:  1) STOP Aspirin 2) REDUCE Amiodarone to 100mg  daily   Labwork: Tsh today  Testing/Procedures: None ordered  Follow-Up: Your physician wants you to follow-up in: 6 months with [name] You will receive a reminder letter in the mail two months in advance. If you don't receive a letter, please call our office to schedule the follow-up appointment.   Any Other Special Instructions Will Be Listed Below (If Applicable).     If you need a refill on your cardiac medications before your next appointment, please call your pharmacy.

## 2016-03-01 ENCOUNTER — Encounter: Payer: Self-pay | Admitting: *Deleted

## 2016-03-01 ENCOUNTER — Other Ambulatory Visit: Payer: Self-pay | Admitting: *Deleted

## 2016-03-01 ENCOUNTER — Ambulatory Visit (INDEPENDENT_AMBULATORY_CARE_PROVIDER_SITE_OTHER): Payer: Commercial Managed Care - HMO | Admitting: Endocrinology

## 2016-03-01 ENCOUNTER — Emergency Department (HOSPITAL_COMMUNITY)
Admission: EM | Admit: 2016-03-01 | Discharge: 2016-03-01 | Disposition: A | Discharge status: Home / Self Care | Payer: Commercial Managed Care - HMO | Attending: Emergency Medicine | Admitting: Emergency Medicine

## 2016-03-01 ENCOUNTER — Other Ambulatory Visit: Payer: Self-pay

## 2016-03-01 ENCOUNTER — Encounter: Payer: Self-pay | Admitting: Endocrinology

## 2016-03-01 ENCOUNTER — Encounter (HOSPITAL_COMMUNITY): Payer: Self-pay

## 2016-03-01 VITALS — BP 104/54 | HR 82

## 2016-03-01 DIAGNOSIS — F329 Major depressive disorder, single episode, unspecified: Secondary | ICD-10-CM | POA: Diagnosis not present

## 2016-03-01 DIAGNOSIS — Z794 Long term (current) use of insulin: Secondary | ICD-10-CM

## 2016-03-01 DIAGNOSIS — E1142 Type 2 diabetes mellitus with diabetic polyneuropathy: Secondary | ICD-10-CM | POA: Diagnosis not present

## 2016-03-01 DIAGNOSIS — E1165 Type 2 diabetes mellitus with hyperglycemia: Secondary | ICD-10-CM

## 2016-03-01 DIAGNOSIS — Z4781 Encounter for orthopedic aftercare following surgical amputation: Secondary | ICD-10-CM | POA: Diagnosis not present

## 2016-03-01 DIAGNOSIS — E785 Hyperlipidemia, unspecified: Secondary | ICD-10-CM | POA: Insufficient documentation

## 2016-03-01 DIAGNOSIS — Z89511 Acquired absence of right leg below knee: Secondary | ICD-10-CM | POA: Diagnosis not present

## 2016-03-01 DIAGNOSIS — E160 Drug-induced hypoglycemia without coma: Secondary | ICD-10-CM | POA: Diagnosis not present

## 2016-03-01 DIAGNOSIS — Z7901 Long term (current) use of anticoagulants: Secondary | ICD-10-CM | POA: Diagnosis not present

## 2016-03-01 DIAGNOSIS — I110 Hypertensive heart disease with heart failure: Secondary | ICD-10-CM | POA: Insufficient documentation

## 2016-03-01 DIAGNOSIS — Z79899 Other long term (current) drug therapy: Secondary | ICD-10-CM | POA: Diagnosis not present

## 2016-03-01 DIAGNOSIS — I480 Paroxysmal atrial fibrillation: Secondary | ICD-10-CM | POA: Diagnosis not present

## 2016-03-01 DIAGNOSIS — T383X5A Adverse effect of insulin and oral hypoglycemic [antidiabetic] drugs, initial encounter: Secondary | ICD-10-CM | POA: Diagnosis not present

## 2016-03-01 DIAGNOSIS — I429 Cardiomyopathy, unspecified: Secondary | ICD-10-CM | POA: Diagnosis not present

## 2016-03-01 DIAGNOSIS — E11649 Type 2 diabetes mellitus with hypoglycemia without coma: Secondary | ICD-10-CM

## 2016-03-01 DIAGNOSIS — I5042 Chronic combined systolic (congestive) and diastolic (congestive) heart failure: Secondary | ICD-10-CM | POA: Insufficient documentation

## 2016-03-01 DIAGNOSIS — I10 Essential (primary) hypertension: Secondary | ICD-10-CM | POA: Diagnosis not present

## 2016-03-01 LAB — CBC WITH DIFFERENTIAL/PLATELET
Basophils Absolute: 0 10*3/uL (ref 0.0–0.1)
Basophils Relative: 0 %
EOS PCT: 5 %
Eosinophils Absolute: 0.3 10*3/uL (ref 0.0–0.7)
HEMATOCRIT: 36.6 % — AB (ref 39.0–52.0)
Hemoglobin: 11.8 g/dL — ABNORMAL LOW (ref 13.0–17.0)
LYMPHS ABS: 2 10*3/uL (ref 0.7–4.0)
LYMPHS PCT: 32 %
MCH: 26.9 pg (ref 26.0–34.0)
MCHC: 32.2 g/dL (ref 30.0–36.0)
MCV: 83.4 fL (ref 78.0–100.0)
MONO ABS: 0.7 10*3/uL (ref 0.1–1.0)
MONOS PCT: 11 %
NEUTROS ABS: 3.2 10*3/uL (ref 1.7–7.7)
Neutrophils Relative %: 52 %
PLATELETS: 226 10*3/uL (ref 150–400)
RBC: 4.39 MIL/uL (ref 4.22–5.81)
RDW: 14.4 % (ref 11.5–15.5)
WBC: 6.2 10*3/uL (ref 4.0–10.5)

## 2016-03-01 LAB — URINALYSIS, ROUTINE W REFLEX MICROSCOPIC
Bilirubin Urine: NEGATIVE
GLUCOSE, UA: 100 mg/dL — AB
HGB URINE DIPSTICK: NEGATIVE
KETONES UR: NEGATIVE mg/dL
LEUKOCYTES UA: NEGATIVE
Nitrite: NEGATIVE
PH: 5.5 (ref 5.0–8.0)
PROTEIN: NEGATIVE mg/dL
Specific Gravity, Urine: 1.015 (ref 1.005–1.030)

## 2016-03-01 LAB — I-STAT CHEM 8, ED
BUN: 25 mg/dL — ABNORMAL HIGH (ref 6–20)
CREATININE: 1.3 mg/dL — AB (ref 0.61–1.24)
Calcium, Ion: 1.21 mmol/L (ref 1.13–1.30)
Chloride: 101 mmol/L (ref 101–111)
Glucose, Bld: 72 mg/dL (ref 65–99)
HEMATOCRIT: 37 % — AB (ref 39.0–52.0)
Hemoglobin: 12.6 g/dL — ABNORMAL LOW (ref 13.0–17.0)
POTASSIUM: 3.6 mmol/L (ref 3.5–5.1)
SODIUM: 142 mmol/L (ref 135–145)
TCO2: 28 mmol/L (ref 0–100)

## 2016-03-01 LAB — COMPREHENSIVE METABOLIC PANEL
ALBUMIN: 3.2 g/dL — AB (ref 3.5–5.0)
ALT: 14 U/L — ABNORMAL LOW (ref 17–63)
ANION GAP: 7 (ref 5–15)
AST: 20 U/L (ref 15–41)
Alkaline Phosphatase: 80 U/L (ref 38–126)
BILIRUBIN TOTAL: 0.4 mg/dL (ref 0.3–1.2)
BUN: 24 mg/dL — AB (ref 6–20)
CHLORIDE: 104 mmol/L (ref 101–111)
CO2: 28 mmol/L (ref 22–32)
Calcium: 9 mg/dL (ref 8.9–10.3)
Creatinine, Ser: 1.33 mg/dL — ABNORMAL HIGH (ref 0.61–1.24)
GFR calc Af Amer: >60 mL/min (ref >60)
GFR calc non Af Amer: 53 mL/min — ABNORMAL LOW (ref >60)
GLUCOSE: 74 mg/dL (ref 65–99)
POTASSIUM: 3.6 mmol/L (ref 3.5–5.1)
SODIUM: 139 mmol/L (ref 135–145)
TOTAL PROTEIN: 7 g/dL (ref 6.5–8.1)

## 2016-03-01 LAB — CBG MONITORING, ED
GLUCOSE-CAPILLARY: 100 mg/dL — AB (ref 65–99)
GLUCOSE-CAPILLARY: 92 mg/dL (ref 65–99)
GLUCOSE-CAPILLARY: 225 mg/dL — AB (ref 65–99)
Glucose-Capillary: 153 mg/dL — ABNORMAL HIGH (ref 65–99)
Glucose-Capillary: 229 mg/dL — ABNORMAL HIGH (ref 65–99)

## 2016-03-01 LAB — TSH: TSH: 2.76 m[IU]/L (ref 0.40–4.50)

## 2016-03-01 NOTE — ED Notes (Signed)
Provided pt with orange juice and bag lunch

## 2016-03-01 NOTE — ED Notes (Signed)
Pt was at home and started to become altered- gf checked sugar and it was 54. Pt at 2 sandwiches pta of gems, but sugar continued to drop. Pt received 1amp d50 iv pta. bg 132. Pt is lethargic but easily aroused and a&Ox4.

## 2016-03-01 NOTE — Discharge Instructions (Signed)
Blood Glucose Monitoring, Adult Monitoring your blood glucose (also know as blood sugar) helps you to manage your diabetes. It also helps you and your health care provider monitor your diabetes and determine how well your treatment plan is working. WHY SHOULD YOU MONITOR YOUR BLOOD GLUCOSE?  It can help you understand how food, exercise, and medicine affect your blood glucose.  It allows you to know what your blood glucose is at any given moment. You can quickly tell if you are having low blood glucose (hypoglycemia) or high blood glucose (hyperglycemia).  It can help you and your health care provider know how to adjust your medicines.  It can help you understand how to manage an illness or adjust medicine for exercise. WHEN SHOULD YOU TEST? Your health care provider will help you decide how often you should check your blood glucose. This may depend on the type of diabetes you have, your diabetes control, or the types of medicines you are taking. Be sure to write down all of your blood glucose readings so that this information can be reviewed with your health care provider. See below for examples of testing times that your health care provider may suggest. Type 1 Diabetes  Test at least 2 times per day if your diabetes is well controlled, if you are using an insulin pump, or if you perform multiple daily injections.  If your diabetes is not well controlled or if you are sick, you may need to test more often.  It is a good idea to also test:  Before every insulin injection.  Before and after exercise.  Between meals and 2 hours after a meal.  Occasionally between 2:00 a.m. and 3:00 a.m. Type 2 Diabetes  If you are taking insulin, test at least 2 times per day. However, it is best to test before every insulin injection.  If you take medicines by mouth (orally), test 2 times a day.  If you are on a controlled diet, test once a day.  If your diabetes is not well controlled or if you  are sick, you may need to monitor more often. HOW TO MONITOR YOUR BLOOD GLUCOSE Supplies Needed  Blood glucose meter.  Test strips for your meter. Each meter has its own strips. You must use the strips that go with your own meter.  A pricking needle (lancet).  A device that holds the lancet (lancing device).  A journal or log book to write down your results. Procedure  Wash your hands with soap and water. Alcohol is not preferred.  Prick the side of your finger (not the tip) with the lancet.  Gently milk the finger until a small drop of blood appears.  Follow the instructions that come with your meter for inserting the test strip, applying blood to the strip, and using your blood glucose meter. Other Areas to Get Blood for Testing Some meters allow you to use other areas of your body (other than your finger) to test your blood. These areas are called alternative sites. The most common alternative sites are:  The forearm.  The thigh.  The back area of the lower leg.  The palm of the hand. The blood flow in these areas is slower. Therefore, the blood glucose values you get may be delayed, and the numbers are different from what you would get from your fingers. Do not use alternative sites if you think you are having hypoglycemia. Your reading will not be accurate. Always use a finger if you are  having hypoglycemia. Also, if you cannot feel your lows (hypoglycemia unawareness), always use your fingers for your blood glucose checks. °ADDITIONAL TIPS FOR GLUCOSE MONITORING °· Do not reuse lancets. °· Always carry your supplies with you. °· All blood glucose meters have a 24-hour "hotline" number to call if you have questions or need help. °· Adjust (calibrate) your blood glucose meter with a control solution after finishing a few boxes of strips. °BLOOD GLUCOSE RECORD KEEPING °It is a good idea to keep a daily record or log of your blood glucose readings. Most glucose meters, if not all,  keep your glucose records stored in the meter. Some meters come with the ability to download your records to your home computer. Keeping a record of your blood glucose readings is especially helpful if you are wanting to look for patterns. Make notes to go along with the blood glucose readings because you might forget what happened at that exact time. Keeping good records helps you and your health care provider to work together to achieve good diabetes management.  °  °This information is not intended to replace advice given to you by your health care provider. Make sure you discuss any questions you have with your health care provider. °  °Document Released: 09/06/2003 Document Revised: 09/24/2014 Document Reviewed: 01/26/2013 °Elsevier Interactive Patient Education ©2016 Else[employer] ° ° °Hypoglycemia °Hypoglycemia occurs when the glucose in your blood is too low. Glucose is a type of sugar that is your body's main energy source. Hormones, such as insulin and glucagon, control the level of glucose in the blood. Insulin lowers blood glucose and glucagon increases blood glucose. Having too much insulin in your blood stream, or not eating enough food containing sugar, can result in hypoglycemia. Hypoglycemia can happen to people with or without diabetes. It can develop quickly and can be a medical emergency.  °CAUSES  °· Missing or delaying meals. °· Not eating enough carbohydrates at meals. °· Taking too much diabetes medicine. °· Not timing your oral diabetes medicine or insulin doses with meals, snacks, and exercise. °· Nausea and vomiting. °· Certain medicines. °· Severe illnesses, such as hepatitis, kidney disorders, and certain eating disorders. °· Increased activity or exercise without eating something extra or adjusting medicines. °· Drinking too much alcohol. °· A nerve disorder that affects body functions like your heart rate, blood pressure, and digestion (autonomic neuropathy). °· A condition where the  stomach muscles do not function properly (gastroparesis). Therefore, medicines and food may not absorb properly. °· Rarely, a tumor of the pancreas can produce too much insulin. °SYMPTOMS  °· Hunger. °· Sweating (diaphoresis). °· Change in body temperature. °· Shakiness. °· Headache. °· Anxiety. °· Lightheadedness. °· Irritability. °· Difficulty concentrating. °· Dry mouth. °· Tingling or numbness in the hands or feet. °· Restless sleep or sleep disturbances. °· Altered speech and coordination. °· Change in mental status. °· Seizures or prolonged convulsions. °· Combativeness. °· Drowsiness (lethargic). °· Weakness. °· Increased heart rate or palpitations. °· Confusion. °· Pale, gray skin color. °· Blurred or double vision. °· Fainting. °DIAGNOSIS  °A physical exam and medical history will be performed. Your caregiver may make a diagnosis based on your symptoms. Blood tests and other lab tests may be performed to confirm a diagnosis. Once the diagnosis is made, your caregiver will see if your signs and symptoms go away once your blood glucose is raised.  °TREATMENT  °Usually, you can easily treat your hypoglycemia when you notice symptoms. °· Check your   blood glucose. If it is less than 70 mg/dl, take one of the following:   °¨ 3-4 glucose tablets.   °¨ ½ cup juice.   °¨ ½ cup regular soda.   °¨ 1 cup skim milk.   °¨ ½-1 tube of glucose gel.   °¨ 5-6 hard candies.   °· Avoid high-fat drinks or food that may delay a rise in blood glucose levels. °· Do not take more than the recommended amount of sugary foods, drinks, gel, or tablets. Doing so will cause your blood glucose to go too high.   °· Wait 10-15 minutes and recheck your blood glucose. If it is still less than 70 mg/dl or below your target range, repeat treatment.   °· Eat a snack if it is more than 1 hour until your next meal.   °There may be a time when your blood glucose may go so low that you are unable to treat yourself at home when you start to notice  symptoms. You may need someone to help you. You may even faint or be unable to swallow. If you cannot treat yourself, someone will need to bring you to the hospital.  °HOME CARE INSTRUCTIONS °· If you have diabetes, follow your diabetes management plan by: °¨ Taking your medicines as directed. °¨ Following your exercise plan. °¨ Following your meal plan. Do not skip meals. Eat on time. °¨ Testing your blood glucose regularly. Check your blood glucose before and after exercise. If you exercise longer or different than usual, be sure to check blood glucose more frequently. °¨ Wearing your medical alert jewelry that says you have diabetes. °· Identify the cause of your hypoglycemia. Then, develop ways to prevent the recurrence of hypoglycemia. °· Do not take a hot bath or shower right after an insulin shot. °· Always carry treatment with you. Glucose tablets are the easiest to carry. °· If you are going to drink alcohol, drink it only with meals. °· Tell friends or family members ways to keep you safe during a seizure. This may include removing hard or sharp objects from the area or turning you on your side. °· Maintain a healthy weight. °SEEK MEDICAL CARE IF:  °· You are having problems keeping your blood glucose in your target range. °· You are having frequent episodes of hypoglycemia. °· You feel you might be having side effects from your medicines. °· You are not sure why your blood glucose is dropping so low. °· You notice a change in vision or a new problem with your vision. °SEEK IMMEDIATE MEDICAL CARE IF:  °· Confusion develops. °· A change in mental status occurs. °· The inability to swallow develops. °· Fainting occurs. °  °This information is not intended to replace advice given to you by your health care provider. Make sure you discuss any questions you have with your health care provider. °  °Document Released: 09/03/2005 Document Revised: 09/08/2013 Document Reviewed: 05/10/2015 °Elsevier Interactive  Patient Education ©2016 Elsevier Inc. ° °

## 2016-03-01 NOTE — ED Provider Notes (Signed)
CSN: [id number]     Arrival date & time 03/01/16  0134 History   First MD Initiated Contact with Patient 03/01/16 0138     Chief Complaint  Patient presents with  . Hypoglycemia     (Consider location/radiation/quality/duration/timing/severity/associated sxs/prior Treatment) HPI   PMH of hypertension, PAF, cardiomyopathy, CHF, type II diabetes, depression, arthritis, aortic valve replacement, hyperlipidemia comes to the ER after having hypoglycemic episode. He reports eating pancakes (2 with a little butter and syrup) , eggs, bacon, and toast around 5 pm. He took his insulin before bed around 9:45 pm. Approx 30 minutes later he developed sweating and lethargy. His significant other kept giving him things to eat and drink with sugar in it but it never got higher than 45, therefore he was brought to the ED.  When EMS got to him his CBG was 54, they gave him an amp of 350 and it improved to 132. He is feeling much better and denies having any symptoms at the time.  Patient has been feeling well lately and says it is possible that he accidentally took his fast acting insulin instead of his long acting insulin.     Past Medical History  Diagnosis Date  . Hypertension   . PAF (paroxysmal atrial fibrillation) ([place])     a. Postop 2008. b. recurrent afib 08/2014 with LAA clot noted on TEE. EF was down again at 25%. He was anticoagulated with Eliquis and placed on Amiodarone.He ultimately underwent TEE-DCCV 08/2014.  Marland Kitchen Hypertriglyceridemia   . Cardiomyopathy ([place])     EF originally 20%-now 45%, no CAD on cath  . Chronic combined systolic and diastolic CHF (congestive heart failure) ([place])     a. 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 Reported h/o cath in 2008 without significant CAD.  Marland Kitchen Type II diabetes mellitus ([place])   . Arthritis     .  Marland Kitchen Depression     "a little right now" (04/19/2015)  . S/P aortic valve  replacement with bioprosthetic valve     a. bioprosthetic AVR in 05/2007 in [place].  . Hyperlipidemia    Past Surgical History  Procedure Laterality Date  . Aortic valve replacement  05/2007    with tissue graft  . I&d extremity Right 05/05/2014    Procedure: IRRIGATION AND DEBRIDEMENT EXTREMITY;  Surgeon: [name], MD;  Location: [place];  Service: Orthopedics;  Laterality: Right;  . Tee without cardioversion N/A 07/23/2014    Procedure: TRANSESOPHAGEAL ECHOCARDIOGRAM (TEE);  Surgeon: Josue Hector, MD;  Location[facility] ENDOSCOPY;  Service: Cardiovascular;  Laterality: N/A;  . Cardioversion N/A 07/23/2014    Procedure: CARDIOVERSION;  Surgeon: Josue Hector, MD;  Location: [facility] ENDOSCOPY;  Service: Cardiovascular;  Laterality: N/A;  . Tee without cardioversion N/A 09/01/2014    Procedure: TRANSESOPHAGEAL ECHOCARDIOGRAM (TEE);  Surgeon: [name], MD;  Location: [facility] ENDOSCOPY;  Service: Cardiovascular;  Laterality: N/A;  . Cardioversion N/A 09/01/2014    Procedure: CARDIOVERSION;  Surgeon: [name], MD;  Location: [facility] ENDOSCOPY;  Service: Cardiovascular;  Laterality: N/A;  . Amputation Right 07/29/2014    Procedure: AMPUTATION RIGHT LONG FINGER;  Surgeon: [name], MD;  Location: [place];  Service: Orthopedics;  Laterality: Right;  . Cardiac valve replacement    . Tonsillectomy  1954  . Cardiac catheterization  "several"  . Amputation Right 01/13/2016    Procedure: RIGHT BELOW KNEE AMPUTATION;  Surgeon: Newt Minion,  MD;  Location: [place];  Service: Orthopedics;  Laterality: Right;  . Application of wound vac Right 01/13/2016    Procedure: APPLICATION OF WOUND VAC;  Surgeon: Newt Minion, MD;  Location: [place];  Service: Orthopedics;  Laterality: Right;   Family History  Problem Relation Age of Onset  . Colon cancer Father   . Diabetes Father   . Liver disease Brother   . Heart murmur Child   . Congenital heart disease Other   . Diabetes Maternal Grandmother   . Heart  disease Maternal Grandfather     A Fib   Social History  Substance Use Topics  . Smoking status: Never Smoker   . Smokeless tobacco: Never Used  . Alcohol Use: 0.0 oz/week    0 Standard drinks or equivalent per week     Comment: 04/19/2015 "might have a beer or 2, 2-3 times/yr"    Review of Systems   Review of Systems All other systems negative except as documented in the HPI. All pertinent positives and negatives as reviewed in the HPI.  Allergies  Amoxicill-clarithro-omeprazole  Home Medications   Prior to Admission medications   Medication Sig Start Date End Date Taking? Authorizing Provider  amiodarone (PACERONE) 100 MG tablet Take 1 tablet (100 mg total) by mouth daily. 02/29/16  Yes Jerline Pain, MD  apixaban (ELIQUIS) 5 MG TABS tablet Take 1 tablet (5 mg total) by mouth 2 (two) times daily. 02/03/16  Yes Daniel J Angiulli, PA-C  atorvastatin (LIPITOR) 10 MG tablet Take 1 tablet (10 mg total) by mouth every morning. 02/03/16  Yes Daniel J Angiulli, PA-C  carvedilol (COREG) 6.25 MG tablet Take 6.25 mg by mouth 2 (two) times daily with a meal.   Yes Historical Provider, MD  DHA-EPA-Coenzyme Q10-Vitamin E (CO Q-10 VITAMIN E FISH OIL PO) Take 1,000 mg by mouth 2 (two) times daily.   Yes Historical Provider, MD  furosemide (LASIX) 40 MG tablet Take 1 tablet (40 mg total) by mouth daily. Patient taking differently: Take 20 mg by mouth daily.  02/03/16  Yes Daniel J Angiulli, PA-C  insulin aspart (NOVOLOG) 100 UNIT/ML injection Inject 3 Units into the skin 3 (three) times daily with meals. 02/03/16  Yes Daniel J Angiulli, PA-C  insulin glargine (LANTUS) 100 UNIT/ML injection Inject 0.27 mLs (27 Units total) into the skin at bedtime. 02/03/16  Yes Daniel J Angiulli, PA-C  Multiple Vitamin (MULTIVITAMIN) tablet Take 1 tablet by mouth daily.   Yes Historical Provider, MD  blood glucose meter kit and supplies Dispense based on patient and insurance preference. Use up to four times daily as  directed. (FOR ICD-9 250.00, 250.01). 01/17/16   Lavina Hamman, MD  glucose blood (ACCU-CHEK SMARTVIEW) test strip Use as instructed to check blood sugar 4 times per day dx code E11.9 07/21/15   Elayne Snare, MD  Insulin Pen Needle 31G X 5 MM MISC 1 each by Does not apply route 4 (four) times daily -  before meals and at bedtime. 01/17/16   Lavina Hamman, MD  Insulin Syringe-Needle U-100 (INSULIN SYRINGE .3CC/31GX5/16") 31G X 5/16" 0.3 ML MISC 1 each by Does not apply route 4 (four) times daily -  before meals and at bedtime. 01/17/16   Lavina Hamman, MD  lisinopril (PRINIVIL,ZESTRIL) 10 MG tablet Take 1 tablet (10 mg total) by mouth at bedtime. Patient not taking: Reported on 03/01/2016 02/03/16   Lavon Paganini Angiulli, PA-C  sildenafil (VIAGRA) 100 MG tablet Take 1 tablet (  100 mg total) by mouth daily as needed for erectile dysfunction (1 hr prior to sexual activity). Patient not taking: Reported on 03/01/2016 10/27/15   Jerline Pain, MD   BP 171/89 mmHg  Pulse 87  Temp(Src) 97.6 F (36.4 C) (Oral)  Resp 18  Ht _0  (1.854 m)  Wt 97.523 kg  BMI 28.37 kg/m2  SpO2 97% Physical Exam  Constitutional: He appears well-developed and well-nourished. No distress.  HENT:  Head: Normocephalic and atraumatic.  Right Ear: Tympanic membrane and ear canal normal.  Left Ear: Tympanic membrane and ear canal normal.  Nose: Nose normal.  Mouth/Throat: Uvula is midline, oropharynx is clear and moist and mucous membranes are normal.  Eyes: Pupils are equal, round, and reactive to light.  Neck: Normal range of motion. Neck supple.  Cardiovascular: Normal rate and regular rhythm.   Pulmonary/Chest: Effort normal.  Abdominal: Soft.  No signs of abdominal distention  Musculoskeletal:  No LE swelling  Neurological: He is alert.  Acting at baseline  Skin: Skin is warm and dry. No rash noted.  Nursing note and vitals reviewed.   ED Course  Procedures (including critical care time) Labs Review Labs Reviewed   COMPREHENSIVE METABOLIC PANEL - Abnormal; Notable for the following:    BUN 24 (*)    Creatinine, Ser 1.33 (*)    Albumin 3.2 (*)    ALT 14 (*)    GFR calc non Af Amer 53 (*)    All other components within normal limits  CBC WITH DIFFERENTIAL/PLATELET - Abnormal; Notable for the following:    Hemoglobin 11.8 (*)    HCT 36.6 (*)    All other components within normal limits  URINALYSIS, ROUTINE W REFLEX MICROSCOPIC (NOT AT [facility]) - Abnormal; Notable for the following:    Glucose, UA 100 (*)    All other components within normal limits  I-STAT CHEM 8, ED - Abnormal; Notable for the following:    BUN 25 (*)    Creatinine, Ser 1.30 (*)    Hemoglobin 12.6 (*)    HCT 37.0 (*)    All other components within normal limits  CBG MONITORING, ED - Abnormal; Notable for the following:    Glucose-Capillary 153 (*)    All other components within normal limits  CBG MONITORING, ED - Abnormal; Notable for the following:    Glucose-Capillary 229 (*)    All other components within normal limits  CBG MONITORING, ED - Abnormal; Notable for the following:    Glucose-Capillary 225 (*)    All other components within normal limits  CBG MONITORING, ED  CBG MONITORING, ED  CBG MONITORING, ED    Imaging Review No results found. I have personally reviewed and evaluated these images and lab results as part of my medical decision-making.   EKG Interpretation   Date/Time:  Thursday March 01 2016 03:49:42 EDT Ventricular Rate:  79 PR Interval:  260 QRS Duration: 144 QT Interval:  458 QTC Calculation: 525 R Axis:   -27 Text Interpretation:  Sinus rhythm Prolonged PR interval Left bundle  branch block Confirmed by Christy Gentles  MD, DONALD ([id number]) on 03/01/2016  4:05:26 AM      MDM   Final diagnoses:  Hypoglycemia associated with type 2 diabetes mellitus (HCC)   Urinalysys, CBC and CMP are largely unremarkable. His glucose was initially 74, he was given a sandwhich and at  _1  CBG is 153 _2   CBG is 229 _3  CBG is 225.  Patient continues to feel well.  He is awake and alert and ready to go home. Discussed medications with Dr. Christy Gentles. Will hold on nighttime long lasting insulin for 24 hours and have patient check glucose before injecting fast acting insulin. He plans to call his endocrinologist Dr. Dwyane Dee when the office opens today.  Medications - No data to display  I discussed results, diagnoses and plan with Thomas Wright. They voice there understanding and questions were answered. We discussed follow-up recommendations and return precautions.       Delos Haring, PA-C 03/01/16 0160  Ripley Fraise, MD 03/01/16 [phone]

## 2016-03-01 NOTE — Patient Outreach (Addendum)
[facility]) Care Management  03/01/2016  TAREZ CARDELLO 1946/02/25 [id number]   Return phone call to patient.  Patient discussed missing his face to face assessment with [name] stating that he was told that they did not travel to his area.  Patient also discussed that he received a call from [facility] and they will not begin to make the leg until his co-pay of $1397.00 is paid.  Per patient, he does not have the money for this.  This social worker contacted [name], spoke with dispatch who confirmed that they do pick up at his address.  This Education officer, museum also spoke with [place] who states that patient can pay at least $650.00 and then make monthly payments on the rest.  Phone call to patient to discuss the above information, however received his voicemail.  HIPPA complaint voicemail left requesting a return call.  Home visit scheduled for 03/16/16.  [name] [facility] Care Management [phone]

## 2016-03-01 NOTE — Progress Notes (Signed)
Patient ID: Thomas Wright, male   DOB: 10/01/1945, 70 y.o.   MRN: [id number]           Reason for Appointment: Follow-up for Type 2 Diabetes  Referring physician: Drema Dallas  History of Present Illness:          Diagnosis: Type 2 diabetes mellitus, date of diagnosis:1993        Past history:  He has been on metformin and glipizide for several years with poor control. Records are available only until 2013 and his A1c has been consistently over 10% In 10/2013 his A1c was 14% He has usually not been checking his blood sugar at home  Recent history:   INSULIN regimen is described as:  LANTUS 27 units at bedtime, Novolog 3 ac 3 times a day with syringes  He was seen for initial consultation for poorly controlled diabetes in 4/16 His Lantus was increased to 30 units and he was also started on Novolog insulin with his evening meal on his last visit However he has not come back for his follow-up as directed  His A1c is still very high at 12.6, previously 11%  Current blood sugar patterns and problems identified:  He was seen urgently in the office today because of an episode of hypoglycemia last night when he took 27 units of Novolog instead of Lantus  His ACCU-CHEK monitor has incorrect time program with the readings about 7 hours behind but not clear when his meter was programmed incorrectly and not clear which times he is actually checking his sugar  He said he was told to take 3 units of NovoLog before meals at the hospital which he isn't doing  He generally appears to be having high readings at bedtime although not consistent with highest reading 277 recently  Usually not checking many readings during the day at lunch or supper time  He thinks his blood sugars are poorly controlled before his hospitalization in April for leg infection  Previously had issues with not being able to afford his insulin and some noncompliance with taking his Lantus  He does continue to be taking  metformin 1 g twice a day and also glipizide       Oral hypoglycemic drugs the patient is taking are:   metformin 1 g twice a day, glipizide 5 mg twice a day     Side effects from medications have been: None recently   Compliance with the medical regimen: Fair Hypoglycemia: Never    Glucose monitoring:  done 2 time a day         Glucometer:  Accu-Chek Blood Glucose readings by download:  Mean values apply above for all meters except median for One Touch  PRE-MEAL Fasting Lunch Dinner Bedtime Overall  Glucose range: 113-178   188-255  139-308    Mean/median: 143     171      Self-care: The diet that the patient has been following is: tries to limit high-fat foods .     Meals: 3 meals per day. Breakfast is oatmeal or eggs        Exercise:  None Dietician visit, most recent: ?  2005              Weight history: in 2013 was 306 lbs, lowest weight recently 201  Wt Readings from Last 3 Encounters:  03/01/16 215 lb (97.523 kg)  02/03/16 227 lb 1.2 oz (103 kg)  01/17/16 246 lb 4.8 oz (111.721 kg)    Glycemic control:  Lab Results  Component Value Date   HGBA1C 12.6* 01/09/2016   HGBA1C 11.0 06/20/2015   HGBA1C 12.1* 11/24/2014   Lab Results  Component Value Date   MICROALBUR 11.3* 05/24/2015   LDLCALC 64 05/24/2015   CREATININE 1.30* 03/01/2016        Medication List       This list is accurate as of: 03/01/16 11:59 PM.  Always use your most recent med list.               amiodarone 100 MG tablet  Commonly known as:  PACERONE  Take 1 tablet (100 mg total) by mouth daily.     apixaban 5 MG Tabs tablet  Commonly known as:  ELIQUIS  Take 1 tablet (5 mg total) by mouth 2 (two) times daily.     atorvastatin 10 MG tablet  Commonly known as:  LIPITOR  Take 1 tablet (10 mg total) by mouth every morning.     blood glucose meter kit and supplies  Dispense based on patient and insurance preference. Use up to four times daily as directed. (FOR ICD-9 250.00,  250.01).     carvedilol 6.25 MG tablet  Commonly known as:  COREG  Take 6.25 mg by mouth 2 (two) times daily with a meal.     CO Q-10 VITAMIN E FISH OIL PO  Take 1,000 mg by mouth 2 (two) times daily.     furosemide 40 MG tablet  Commonly known as:  LASIX  Take 1 tablet (40 mg total) by mouth daily.     glucose blood test strip  Commonly known as:  ACCU-CHEK SMARTVIEW  Use as instructed to check blood sugar 4 times per day dx code E11.9     insulin aspart 100 UNIT/ML injection  Commonly known as:  novoLOG  Inject 3 Units into the skin 3 (three) times daily with meals.     insulin glargine 100 UNIT/ML injection  Commonly known as:  LANTUS  Inject 0.27 mLs (27 Units total) into the skin at bedtime.     Insulin Pen Needle 31G X 5 MM Misc  1 each by Does not apply route 4 (four) times daily -  before meals and at bedtime.     INSULIN SYRINGE .3CC/31GX5/16" 31G X 5/16" 0.3 ML Misc  1 each by Does not apply route 4 (four) times daily -  before meals and at bedtime.     lisinopril 10 MG tablet  Commonly known as:  PRINIVIL,ZESTRIL  Take 1 tablet (10 mg total) by mouth at bedtime.     multivitamin tablet  Take 1 tablet by mouth daily.     sildenafil 100 MG tablet  Commonly known as:  VIAGRA  Take 1 tablet (100 mg total) by mouth daily as needed for erectile dysfunction (1 hr prior to sexual activity).        Allergies:  Allergies  Allergen Reactions  . Amoxicill-Clarithro-Omeprazole Diarrhea and Nausea And Vomiting    Past Medical History  Diagnosis Date  . Hypertension   . PAF (paroxysmal atrial fibrillation) ([place])     a. Postop 2008. b. recurrent afib 08/2014 with LAA clot noted on TEE. EF was down again at 25%. He was anticoagulated with Eliquis and placed on Amiodarone.He ultimately underwent TEE-DCCV 08/2014.  Marland Kitchen Hypertriglyceridemia   . Cardiomyopathy ([place])     EF originally 20%-now 45%, no CAD on cath  . Chronic combined systolic and diastolic CHF (congestive  heart failure) ([place])  a. 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 Reported h/o cath in 2008 without significant CAD.  Marland Kitchen Type II diabetes mellitus ([place])   . Arthritis     .  Marland Kitchen Depression     "a little right now" (04/19/2015)  . S/P aortic valve replacement with bioprosthetic valve     a. bioprosthetic AVR in 05/2007 in [place].  . Hyperlipidemia     Past Surgical History  Procedure Laterality Date  . Aortic valve replacement  05/2007    with tissue graft  . I&d extremity Right 05/05/2014    Procedure: IRRIGATION AND DEBRIDEMENT EXTREMITY;  Surgeon: [name], MD;  Location: [place];  Service: Orthopedics;  Laterality: Right;  . Tee without cardioversion N/A 07/23/2014    Procedure: TRANSESOPHAGEAL ECHOCARDIOGRAM (TEE);  Surgeon: Josue Hector, MD;  Location[facility] ENDOSCOPY;  Service: Cardiovascular;  Laterality: N/A;  . Cardioversion N/A 07/23/2014    Procedure: CARDIOVERSION;  Surgeon: Josue Hector, MD;  Location: [facility] ENDOSCOPY;  Service: Cardiovascular;  Laterality: N/A;  . Tee without cardioversion N/A 09/01/2014    Procedure: TRANSESOPHAGEAL ECHOCARDIOGRAM (TEE);  Surgeon: [name], MD;  Location: [facility] ENDOSCOPY;  Service: Cardiovascular;  Laterality: N/A;  . Cardioversion N/A 09/01/2014    Procedure: CARDIOVERSION;  Surgeon: [name], MD;  Location: [facility] ENDOSCOPY;  Service: Cardiovascular;  Laterality: N/A;  . Amputation Right 07/29/2014    Procedure: AMPUTATION RIGHT LONG FINGER;  Surgeon: [name], MD;  Location: [place];  Service: Orthopedics;  Laterality: Right;  . Cardiac valve replacement    . Tonsillectomy  1954  . Cardiac catheterization  "several"  . Amputation Right 01/13/2016    Procedure: RIGHT BELOW KNEE AMPUTATION;  Surgeon: Newt Minion, MD;  Location: [place];  Service: Orthopedics;  Laterality: Right;  . Application of wound vac Right 01/13/2016    Procedure: APPLICATION OF WOUND  VAC;  Surgeon: Newt Minion, MD;  Location: [place];  Service: Orthopedics;  Laterality: Right;    Family History  Problem Relation Age of Onset  . Colon cancer Father   . Diabetes Father   . Liver disease Brother   . Heart murmur Child   . Congenital heart disease Other   . Diabetes Maternal Grandmother   . Heart disease Maternal Grandfather     A Fib    Social History:  reports that he has never smoked. He has never used smokeless tobacco. He reports that he drinks alcohol. He reports that he does not use illicit drugs.    Review of Systems    Lipid history: LDL Fairly good, taking Lipitor 10 mg for control, HDL .low, no recent labs available and generally followed by PCP       Lab Results  Component Value Date   CHOL 127 05/24/2015   HDL 38.70* 05/24/2015   LDLCALC 64 05/24/2015   TRIG 123.0 05/24/2015   CHOLHDL 3 05/24/2015           Hypertension: mild and treated with low-dose Coreg And lisinopril.      Complications from diabetes: Peripheral neuropathy, right leg amputations, neurotrophic diabetic ulcers, history of microalbuminuria, erectile dysfunction    Physical Examination:   BP 104/54 mmHg  Pulse 82  Wt   SpO2 97%       ASSESSMENT:  Diabetes type 2, uncontrolled    He has had persistently poor control of his diabetes for at least 3  years   He has been noncompliant with follow-up and A1c still high at 12.4 %.  Since he does not have the right date and time programmed on his monitor difficult to analyze his home blood sugar readings Also appears to be checking blood sugars mostly fasting and some in the evenings, not clear which readings are before or after meals Most likely he is not getting enough Novolog for his meals since fasting blood sugars are averaging about 140 He may be starting to be a little better with his diet also but still has readings over 200 after meals in the evening Not clear if his Lantus is consistently lasting 24 hours with  once a day dosage Recently had accident lower dose of Novolog causing hypoglycemia and ER visit  Hyperlipidemia: Followed by PCP, labs not available  Mild hypertension: Well controlled now  PLAN:   Empirically increase Novolog to 5 units at suppertime  Split Lantus to twice a day for better 24-hour coverage with 14 units  Discussed maybe adjusting the Lantus in the evening every week by one unit at least to keep fasting readings controlled  Restart metformin with half tablet at breakfast and one tablet at suppertime, he does have a supply at home  He will call back to let us know if [name] is covered on his insurance  Have reprogrammed his monitor  More readings after breakfast and lunch  Most likely should have more diabetes education  Patient Instructions  Lantus 14 units at Bfst and supper  Novolog: 3-3-5 at meals  Metformin 1/2 at Bfst and 1 at supper  Coverage FOR TRESIBA OR TOUJEO  Check blood sugars on waking up 5-6  times a week  Also check blood sugars about 2 hours after a meal and do this after different meals by rotation  Recommended blood sugar levels on waking up is 90-130 and about 2-3 hours after meal is 130-160  Please bring your blood sugar monitor to each visit, thank you      Counseling time on subjects discussed above is over 50% of today's 25 minute visit  Jehad Bisono 03/02/2016, 12:51 PM   Note: This office note was prepared with Estate agent. Any transcriptional errors that result from this process are unintentional.

## 2016-03-01 NOTE — ED Provider Notes (Signed)
Patient seen/examined in the Emergency Department in conjunction with Midlevel Provider Carlota Raspberry Patient presents after episode of hypoglycemia Exam : resting comfortably but arousable, no distress, denies HA/CP/abd pain No focal weakness in extremities noted.  He answers questions appropriately Plan: continue to monitor glucose.  If no significant drops he can be discharged but will need medication adjustment    Ripley Fraise, MD 03/01/16 [phone]

## 2016-03-01 NOTE — Patient Outreach (Signed)
Discharge call made to patient who identified himself by providing date of birth and address. Patient reports going to the hospital last night via ambulance after experiencing a hypoglycemic episode.  Patient sates he took the wrong insulin (short acting) when he intended to take the long acting insulin. Patient states he understands what his problem was. This RNCM advised patient to color code the insulins with a high lighter to be able to tell the difference.     Patient denies any further need for community care coordination at this time. This RNCM and patient reviewed his case management care plan, updated as needed. RNCM advised patient he can self refer to [facility] Program in the future if needed by calling the [facility] main number. patient states he has this RNCM's card and Information Packet with contact information.  Patient states his friend Asencion Partridge is still living with him, provides support, transportation and meals for him.

## 2016-03-01 NOTE — Patient Instructions (Addendum)
Lantus 14 units at Bfst and supper  Novolog: 3-3-5 at meals  Metformin 1/2 at Bfst and 1 at supper  Coverage FOR TRESIBA OR TOUJEO  Check blood sugars on waking up 5-6  times a week  Also check blood sugars about 2 hours after a meal and do this after different meals by rotation  Recommended blood sugar levels on waking up is 90-130 and about 2-3 hours after meal is 130-160  Please bring your blood sugar monitor to each visit, thank you

## 2016-03-01 NOTE — Progress Notes (Signed)
This encounter was created in error - please disregard.

## 2016-03-01 NOTE — ED Notes (Signed)
cbg 100

## 2016-03-02 ENCOUNTER — Telehealth: Payer: Self-pay | Admitting: Cardiology

## 2016-03-02 ENCOUNTER — Other Ambulatory Visit: Payer: Self-pay | Admitting: *Deleted

## 2016-03-02 DIAGNOSIS — Z4781 Encounter for orthopedic aftercare following surgical amputation: Secondary | ICD-10-CM | POA: Diagnosis not present

## 2016-03-02 DIAGNOSIS — I429 Cardiomyopathy, unspecified: Secondary | ICD-10-CM | POA: Diagnosis not present

## 2016-03-02 DIAGNOSIS — F329 Major depressive disorder, single episode, unspecified: Secondary | ICD-10-CM | POA: Diagnosis not present

## 2016-03-02 DIAGNOSIS — I480 Paroxysmal atrial fibrillation: Secondary | ICD-10-CM | POA: Diagnosis not present

## 2016-03-02 DIAGNOSIS — I5042 Chronic combined systolic (congestive) and diastolic (congestive) heart failure: Secondary | ICD-10-CM | POA: Diagnosis not present

## 2016-03-02 DIAGNOSIS — Z89511 Acquired absence of right leg below knee: Secondary | ICD-10-CM | POA: Diagnosis not present

## 2016-03-02 DIAGNOSIS — I10 Essential (primary) hypertension: Secondary | ICD-10-CM | POA: Diagnosis not present

## 2016-03-02 DIAGNOSIS — E1142 Type 2 diabetes mellitus with diabetic polyneuropathy: Secondary | ICD-10-CM | POA: Diagnosis not present

## 2016-03-02 DIAGNOSIS — E785 Hyperlipidemia, unspecified: Secondary | ICD-10-CM | POA: Diagnosis not present

## 2016-03-02 NOTE — Telephone Encounter (Signed)
Reviewed TSH result with pt who states understanding.

## 2016-03-02 NOTE — Patient Outreach (Signed)
[place] [facility]) Care Management  03/02/2016  Thomas Wright 1946/06/25 [id number]   Phone call to patient to follow up on [name] transportation.  This Education officer, museum informed patient of conversation had with dispatch who confirmed that they do pick up at his address.  Patient agrees to call the [name] office to re-schedule his assessment for [name].  Patient also given the contact phone number to schedule transportation through his insurance benefit.  Co-pay for his prosthetic leg discussed.  Per bio-tech patient can pay half down and the rest of the co-pay in installments. Per patient , due to his recent job loss, he cannot afford this and his recent and housing expenses.  Per patient, he will try and "figure something out".  Home visit scheduled with patient on 02/3016,  this social worker will assist patient with exploring all possible options to obtain his prosthetic leg.   [name] [facility] Care Management [phone]

## 2016-03-02 NOTE — Telephone Encounter (Signed)
Follow Up ° °Pt returned call for labs °

## 2016-03-04 DIAGNOSIS — I509 Heart failure, unspecified: Secondary | ICD-10-CM | POA: Diagnosis not present

## 2016-03-04 DIAGNOSIS — Z89511 Acquired absence of right leg below knee: Secondary | ICD-10-CM | POA: Diagnosis not present

## 2016-03-05 DIAGNOSIS — Z89511 Acquired absence of right leg below knee: Secondary | ICD-10-CM | POA: Diagnosis not present

## 2016-03-05 DIAGNOSIS — I509 Heart failure, unspecified: Secondary | ICD-10-CM | POA: Diagnosis not present

## 2016-03-06 DIAGNOSIS — Z89511 Acquired absence of right leg below knee: Secondary | ICD-10-CM | POA: Diagnosis not present

## 2016-03-06 DIAGNOSIS — I480 Paroxysmal atrial fibrillation: Secondary | ICD-10-CM | POA: Diagnosis not present

## 2016-03-06 DIAGNOSIS — I429 Cardiomyopathy, unspecified: Secondary | ICD-10-CM | POA: Diagnosis not present

## 2016-03-06 DIAGNOSIS — E785 Hyperlipidemia, unspecified: Secondary | ICD-10-CM | POA: Diagnosis not present

## 2016-03-06 DIAGNOSIS — I10 Essential (primary) hypertension: Secondary | ICD-10-CM | POA: Diagnosis not present

## 2016-03-06 DIAGNOSIS — Z4781 Encounter for orthopedic aftercare following surgical amputation: Secondary | ICD-10-CM | POA: Diagnosis not present

## 2016-03-06 DIAGNOSIS — E1142 Type 2 diabetes mellitus with diabetic polyneuropathy: Secondary | ICD-10-CM | POA: Diagnosis not present

## 2016-03-06 DIAGNOSIS — F329 Major depressive disorder, single episode, unspecified: Secondary | ICD-10-CM | POA: Diagnosis not present

## 2016-03-06 DIAGNOSIS — I5042 Chronic combined systolic (congestive) and diastolic (congestive) heart failure: Secondary | ICD-10-CM | POA: Diagnosis not present

## 2016-03-07 DIAGNOSIS — Z4781 Encounter for orthopedic aftercare following surgical amputation: Secondary | ICD-10-CM | POA: Diagnosis not present

## 2016-03-07 DIAGNOSIS — I480 Paroxysmal atrial fibrillation: Secondary | ICD-10-CM | POA: Diagnosis not present

## 2016-03-07 DIAGNOSIS — I10 Essential (primary) hypertension: Secondary | ICD-10-CM | POA: Diagnosis not present

## 2016-03-07 DIAGNOSIS — I5042 Chronic combined systolic (congestive) and diastolic (congestive) heart failure: Secondary | ICD-10-CM | POA: Diagnosis not present

## 2016-03-07 DIAGNOSIS — F329 Major depressive disorder, single episode, unspecified: Secondary | ICD-10-CM | POA: Diagnosis not present

## 2016-03-07 DIAGNOSIS — E785 Hyperlipidemia, unspecified: Secondary | ICD-10-CM | POA: Diagnosis not present

## 2016-03-07 DIAGNOSIS — Z89511 Acquired absence of right leg below knee: Secondary | ICD-10-CM | POA: Diagnosis not present

## 2016-03-07 DIAGNOSIS — I429 Cardiomyopathy, unspecified: Secondary | ICD-10-CM | POA: Diagnosis not present

## 2016-03-07 DIAGNOSIS — E1142 Type 2 diabetes mellitus with diabetic polyneuropathy: Secondary | ICD-10-CM | POA: Diagnosis not present

## 2016-03-08 DIAGNOSIS — Z4781 Encounter for orthopedic aftercare following surgical amputation: Secondary | ICD-10-CM | POA: Diagnosis not present

## 2016-03-08 DIAGNOSIS — I429 Cardiomyopathy, unspecified: Secondary | ICD-10-CM | POA: Diagnosis not present

## 2016-03-08 DIAGNOSIS — Z89511 Acquired absence of right leg below knee: Secondary | ICD-10-CM | POA: Diagnosis not present

## 2016-03-08 DIAGNOSIS — F329 Major depressive disorder, single episode, unspecified: Secondary | ICD-10-CM | POA: Diagnosis not present

## 2016-03-08 DIAGNOSIS — I10 Essential (primary) hypertension: Secondary | ICD-10-CM | POA: Diagnosis not present

## 2016-03-08 DIAGNOSIS — I480 Paroxysmal atrial fibrillation: Secondary | ICD-10-CM | POA: Diagnosis not present

## 2016-03-08 DIAGNOSIS — I5042 Chronic combined systolic (congestive) and diastolic (congestive) heart failure: Secondary | ICD-10-CM | POA: Diagnosis not present

## 2016-03-08 DIAGNOSIS — E1142 Type 2 diabetes mellitus with diabetic polyneuropathy: Secondary | ICD-10-CM | POA: Diagnosis not present

## 2016-03-08 DIAGNOSIS — E785 Hyperlipidemia, unspecified: Secondary | ICD-10-CM | POA: Diagnosis not present

## 2016-03-09 ENCOUNTER — Other Ambulatory Visit: Payer: Self-pay

## 2016-03-09 NOTE — Patient Outreach (Signed)
Case closure letter sent to pateint and primary care physician. Patient has met all his case management goals.

## 2016-03-12 DIAGNOSIS — E1142 Type 2 diabetes mellitus with diabetic polyneuropathy: Secondary | ICD-10-CM | POA: Diagnosis not present

## 2016-03-12 DIAGNOSIS — F329 Major depressive disorder, single episode, unspecified: Secondary | ICD-10-CM | POA: Diagnosis not present

## 2016-03-12 DIAGNOSIS — E785 Hyperlipidemia, unspecified: Secondary | ICD-10-CM | POA: Diagnosis not present

## 2016-03-12 DIAGNOSIS — I10 Essential (primary) hypertension: Secondary | ICD-10-CM | POA: Diagnosis not present

## 2016-03-12 DIAGNOSIS — I480 Paroxysmal atrial fibrillation: Secondary | ICD-10-CM | POA: Diagnosis not present

## 2016-03-12 DIAGNOSIS — I5042 Chronic combined systolic (congestive) and diastolic (congestive) heart failure: Secondary | ICD-10-CM | POA: Diagnosis not present

## 2016-03-12 DIAGNOSIS — I429 Cardiomyopathy, unspecified: Secondary | ICD-10-CM | POA: Diagnosis not present

## 2016-03-12 DIAGNOSIS — Z4781 Encounter for orthopedic aftercare following surgical amputation: Secondary | ICD-10-CM | POA: Diagnosis not present

## 2016-03-12 DIAGNOSIS — Z89511 Acquired absence of right leg below knee: Secondary | ICD-10-CM | POA: Diagnosis not present

## 2016-03-13 ENCOUNTER — Other Ambulatory Visit: Payer: Self-pay | Admitting: Endocrinology

## 2016-03-15 DIAGNOSIS — I10 Essential (primary) hypertension: Secondary | ICD-10-CM | POA: Diagnosis not present

## 2016-03-15 DIAGNOSIS — F329 Major depressive disorder, single episode, unspecified: Secondary | ICD-10-CM | POA: Diagnosis not present

## 2016-03-15 DIAGNOSIS — E785 Hyperlipidemia, unspecified: Secondary | ICD-10-CM | POA: Diagnosis not present

## 2016-03-15 DIAGNOSIS — I5042 Chronic combined systolic (congestive) and diastolic (congestive) heart failure: Secondary | ICD-10-CM | POA: Diagnosis not present

## 2016-03-15 DIAGNOSIS — I480 Paroxysmal atrial fibrillation: Secondary | ICD-10-CM | POA: Diagnosis not present

## 2016-03-15 DIAGNOSIS — Z89511 Acquired absence of right leg below knee: Secondary | ICD-10-CM | POA: Diagnosis not present

## 2016-03-15 DIAGNOSIS — E1142 Type 2 diabetes mellitus with diabetic polyneuropathy: Secondary | ICD-10-CM | POA: Diagnosis not present

## 2016-03-15 DIAGNOSIS — Z4781 Encounter for orthopedic aftercare following surgical amputation: Secondary | ICD-10-CM | POA: Diagnosis not present

## 2016-03-15 DIAGNOSIS — I429 Cardiomyopathy, unspecified: Secondary | ICD-10-CM | POA: Diagnosis not present

## 2016-03-16 ENCOUNTER — Other Ambulatory Visit: Payer: Self-pay | Admitting: *Deleted

## 2016-03-16 DIAGNOSIS — Z794 Long term (current) use of insulin: Principal | ICD-10-CM

## 2016-03-16 DIAGNOSIS — E119 Type 2 diabetes mellitus without complications: Secondary | ICD-10-CM

## 2016-03-16 NOTE — Patient Outreach (Signed)
Thomas Wright) Care Management  Thomas Wright Social Work  03/16/2016  Thomas Wright 05-29-1946 [id number]  Subjective:  Patient is a 70 year old male, who is a recent amputee.  Objective:   Encounter Medications:  Outpatient Encounter Prescriptions as of 03/16/2016  Medication Sig  . amiodarone (PACERONE) 100 MG tablet Take 1 tablet (100 mg total) by mouth daily.  Marland Kitchen apixaban (ELIQUIS) 5 MG TABS tablet Take 1 tablet (5 mg total) by mouth 2 (two) times daily.  Marland Kitchen atorvastatin (LIPITOR) 10 MG tablet Take 1 tablet (10 mg total) by mouth every morning.  . Blood Glucose Monitoring Suppl (ACCU-CHEK NANO SMARTVIEW) w/Device KIT USE TO CHECK BLOOD SUGAR FOUR TIMES PER DAY (NEEDS APPOINTMENT)  . carvedilol (COREG) 6.25 MG tablet Take 6.25 mg by mouth 2 (two) times daily with a meal.  . DHA-EPA-Coenzyme Q10-Vitamin E (CO Q-10 VITAMIN E FISH OIL PO) Take 1,000 mg by mouth 2 (two) times daily.  . furosemide (LASIX) 40 MG tablet Take 1 tablet (40 mg total) by mouth daily. (Patient taking differently: Take 40 mg by mouth. Every other day)  . glucose blood (ACCU-CHEK SMARTVIEW) test strip Use as instructed to check blood sugar 4 times per day dx code E11.9  . insulin aspart (NOVOLOG) 100 UNIT/ML injection Inject 3 Units into the skin 3 (three) times daily with meals.  . insulin glargine (LANTUS) 100 UNIT/ML injection Inject 0.27 mLs (27 Units total) into the skin at bedtime.  . Insulin Pen Needle 31G X 5 MM MISC 1 each by Does not apply route 4 (four) times daily -  before meals and at bedtime.  . Insulin Syringe-Needle U-100 (INSULIN SYRINGE .3CC/31GX5/16") 31G X 5/16" 0.3 ML MISC 1 each by Does not apply route 4 (four) times daily -  before meals and at bedtime.  Marland Kitchen lisinopril (PRINIVIL,ZESTRIL) 10 MG tablet Take 1 tablet (10 mg total) by mouth at bedtime.  . Multiple Vitamin (MULTIVITAMIN) tablet Take 1 tablet by mouth daily.  . sildenafil (VIAGRA) 100 MG tablet Take 1 tablet (100 mg total) by  mouth daily as needed for erectile dysfunction (1 hr prior to sexual activity).   No facility-administered encounter medications on file as of 03/16/2016.    Functional Status:  In your present state of health, do you have any difficulty performing the following activities: 01/04/2016 04/19/2015  Hearing? N N  Vision? N N  Difficulty concentrating or making decisions? N N  Walking or climbing stairs? Y Y  Dressing or bathing? N N  Doing errands, shopping? N N  Preparing Food and eating ? N -  Using the Toilet? N -  In the past six months, have you accidently leaked urine? N -  Do you have problems with loss of bowel control? N -  Managing your Medications? N -  Managing your Finances? N -  Housekeeping or managing your Housekeeping? N -    Fall/Depression Screening:  PHQ 2/9 Scores 02/16/2016 01/04/2016 11/18/2015  PHQ - 2 Score 1 0 0  PHQ- 9 Score 2 - -    Assessment: Patient is a recent amputee, currently working wit Thomas Wright to obtain his prosthetic leg.  Per patient, he has a a go fund me page to raise money for his prosthesis.  Patient has also contacted family and friends who are willing to assist.  Friends and family are donating either to the go fund me page or directly to Thomas Wright.  Patient's next appointment with Thomas Wright on 04/03/16 to form the  mold for the prosthetic leg.    This social worker left a Dance movement psychotherapist with the coordinator of the Thomas Wright, Thomas Wright [phone] to request any information she may have regarding available assistance with prosthetic leg.  This Education officer, museum also contacted Vocational Rehab, Message left wth Thomas Wright with Thomas Wright to see if she had any information regarding financial help as well.  Patient has not contacting Thomas Wright to re-schedule his face to face assessment. Phone call to Thomas Wright, spoke with Thomas Wright who checked his address again and confirmed that [place] the city limits for , closes pick up would be  the Thomas Wright appointment was not re-scheduled.   Patient reminded to call and re-schedule.  Also reminded that he has transportation benefit through Thomas Wright.  Patient states that he will apply for food stamps next week.  It was suggested that patient also apply for Medicaid due to his recent drop in income as well as Agricultural engineer.  Address and contact number given and list of documents to bring to complete application.  Patient's blood pressure 190/105, feels he needs Thomas Wright to assist with managing his blood pressure. Order for Thomas Wright sent  Plan: Patient requested a fan, Thomas Wright contacted, they have fans available.  This Education officer, museum will pick up one for patient. Patient will continue fundraising efforts for prosthetic leg.   Patient will apply for Medicaid and food stamps due to the change in income Thomas Wright on home safety assigned Follow up within 1 month

## 2016-03-19 DIAGNOSIS — I429 Cardiomyopathy, unspecified: Secondary | ICD-10-CM | POA: Diagnosis not present

## 2016-03-19 DIAGNOSIS — I10 Essential (primary) hypertension: Secondary | ICD-10-CM | POA: Diagnosis not present

## 2016-03-19 DIAGNOSIS — F329 Major depressive disorder, single episode, unspecified: Secondary | ICD-10-CM | POA: Diagnosis not present

## 2016-03-19 DIAGNOSIS — E1142 Type 2 diabetes mellitus with diabetic polyneuropathy: Secondary | ICD-10-CM | POA: Diagnosis not present

## 2016-03-19 DIAGNOSIS — I480 Paroxysmal atrial fibrillation: Secondary | ICD-10-CM | POA: Diagnosis not present

## 2016-03-19 DIAGNOSIS — Z89511 Acquired absence of right leg below knee: Secondary | ICD-10-CM | POA: Diagnosis not present

## 2016-03-19 DIAGNOSIS — E785 Hyperlipidemia, unspecified: Secondary | ICD-10-CM | POA: Diagnosis not present

## 2016-03-19 DIAGNOSIS — I5042 Chronic combined systolic (congestive) and diastolic (congestive) heart failure: Secondary | ICD-10-CM | POA: Diagnosis not present

## 2016-03-19 DIAGNOSIS — Z4781 Encounter for orthopedic aftercare following surgical amputation: Secondary | ICD-10-CM | POA: Diagnosis not present

## 2016-03-22 ENCOUNTER — Telehealth: Payer: Self-pay | Admitting: Endocrinology

## 2016-03-22 DIAGNOSIS — I509 Heart failure, unspecified: Secondary | ICD-10-CM | POA: Diagnosis not present

## 2016-03-22 DIAGNOSIS — Z89511 Acquired absence of right leg below knee: Secondary | ICD-10-CM | POA: Diagnosis not present

## 2016-03-22 NOTE — Telephone Encounter (Signed)
See below. Which medication should be sent?

## 2016-03-22 NOTE — Telephone Encounter (Signed)
[name] if covered otherwise Toujeo, 28 units once a day

## 2016-03-22 NOTE — Telephone Encounter (Signed)
Please call into [facility] the replacement of the lantus you suggested

## 2016-03-23 ENCOUNTER — Other Ambulatory Visit: Payer: Self-pay

## 2016-03-23 MED ORDER — INSULIN GLARGINE 300 UNIT/ML ~~LOC~~ SOPN: 28.0000 [IU] | PEN_INJECTOR | Freq: Every day | SUBCUTANEOUS | Status: DC

## 2016-03-23 NOTE — Telephone Encounter (Signed)
I contacted the pt and advised rx for Toujeo has been submitted.

## 2016-03-23 NOTE — Addendum Note (Signed)
Addended by: Verlin Grills T on: 03/23/2016 09:54 AM   Modules accepted: Orders

## 2016-03-24 NOTE — Initial Assessments (Signed)
Telephone contact to schedule home visit for hypertension education. Patient identified himself by providing date of birth and address.  Plan: Home visit next week for hypertension educaiton

## 2016-03-28 DIAGNOSIS — I10 Essential (primary) hypertension: Secondary | ICD-10-CM | POA: Diagnosis not present

## 2016-03-28 DIAGNOSIS — Z89511 Acquired absence of right leg below knee: Secondary | ICD-10-CM | POA: Diagnosis not present

## 2016-03-28 DIAGNOSIS — F329 Major depressive disorder, single episode, unspecified: Secondary | ICD-10-CM | POA: Diagnosis not present

## 2016-03-28 DIAGNOSIS — I429 Cardiomyopathy, unspecified: Secondary | ICD-10-CM | POA: Diagnosis not present

## 2016-03-28 DIAGNOSIS — Z4781 Encounter for orthopedic aftercare following surgical amputation: Secondary | ICD-10-CM | POA: Diagnosis not present

## 2016-03-28 DIAGNOSIS — E1142 Type 2 diabetes mellitus with diabetic polyneuropathy: Secondary | ICD-10-CM | POA: Diagnosis not present

## 2016-03-28 DIAGNOSIS — E785 Hyperlipidemia, unspecified: Secondary | ICD-10-CM | POA: Diagnosis not present

## 2016-03-28 DIAGNOSIS — I5042 Chronic combined systolic (congestive) and diastolic (congestive) heart failure: Secondary | ICD-10-CM | POA: Diagnosis not present

## 2016-03-28 DIAGNOSIS — I480 Paroxysmal atrial fibrillation: Secondary | ICD-10-CM | POA: Diagnosis not present

## 2016-03-29 ENCOUNTER — Other Ambulatory Visit: Payer: Self-pay

## 2016-03-30 ENCOUNTER — Telehealth: Payer: Self-pay | Admitting: Cardiology

## 2016-03-30 ENCOUNTER — Other Ambulatory Visit: Payer: Self-pay | Admitting: *Deleted

## 2016-03-30 DIAGNOSIS — M25511 Pain in right shoulder: Secondary | ICD-10-CM | POA: Diagnosis not present

## 2016-03-30 DIAGNOSIS — R0989 Other specified symptoms and signs involving the circulatory and respiratory systems: Secondary | ICD-10-CM | POA: Diagnosis not present

## 2016-03-30 MED ORDER — APIXABAN 5 MG PO TABS: 5.0000 mg | ORAL_TABLET | Freq: Two times a day (BID) | ORAL | Status: DC

## 2016-03-30 NOTE — Telephone Encounter (Signed)
°*  STAT* If patient is at the pharmacy, call can be transferred to refill team.   1. Which medications need to be refilled? (please list name of each medication and dose if known) Eliquis 5 mg 2. Which pharmacy/location (including street and city if local pharmacy) is medication to be sent to?[facility]  3. Do they need a 30 day or 90 day supply? 180 and refills

## 2016-03-30 NOTE — Patient Outreach (Signed)
[place] [facility]) Care Management  03/29/2106  Thomas Wright 03/13/1946 [id number]   Home visit for hypertension education. Patient reports blood pressure recordings of 150-190/90 105 this week. Patient self monitors at home with a wrist fitting blood pressure cuff.  Telephone call made to patient's primary care physician to report. Dr. Eliane Decree Nurse was able to collect information to be discussed with Dr. Betha Loa with plans to call patient with information on discussion results.    Patient and this RNCM viewed [name] video on Hypertension, Healthy Eating, Chronic Kidney Disease.    Plan: Telephone contact with patient next week for further hypertension education.

## 2016-04-03 DIAGNOSIS — Z89511 Acquired absence of right leg below knee: Secondary | ICD-10-CM | POA: Diagnosis not present

## 2016-04-03 DIAGNOSIS — I509 Heart failure, unspecified: Secondary | ICD-10-CM | POA: Diagnosis not present

## 2016-04-04 DIAGNOSIS — I509 Heart failure, unspecified: Secondary | ICD-10-CM | POA: Diagnosis not present

## 2016-04-04 DIAGNOSIS — Z89511 Acquired absence of right leg below knee: Secondary | ICD-10-CM | POA: Diagnosis not present

## 2016-04-06 ENCOUNTER — Ambulatory Visit: Payer: Self-pay | Admitting: *Deleted

## 2016-04-09 ENCOUNTER — Ambulatory Visit: Payer: Commercial Managed Care - HMO | Admitting: Endocrinology

## 2016-04-09 DIAGNOSIS — M25511 Pain in right shoulder: Secondary | ICD-10-CM | POA: Diagnosis not present

## 2016-04-10 ENCOUNTER — Other Ambulatory Visit: Payer: Self-pay

## 2016-04-10 ENCOUNTER — Other Ambulatory Visit: Payer: Self-pay | Admitting: *Deleted

## 2016-04-10 ENCOUNTER — Ambulatory Visit: Payer: Self-pay | Admitting: *Deleted

## 2016-04-10 NOTE — Patient Outreach (Signed)
[place] [facility]) Care Management  04/10/2016  NOSSON COVAULT [birth year] [id number]   Phone call to patient using new phone number provided to confirm home visit for today.  Voicemail has not been set up, will try again on 04/11/16 to contact patient.    [name] [facility] Care Management [phone]

## 2016-04-12 NOTE — Patient Outreach (Deleted)
[facility]) Care Management   04/12/2016  Thomas Wright 05-23-1946 [id number]  Thomas Wright is an 70 y.o. male   Telephone contact to assess patient's progression towards meeting her case management goals.  Subjective:  I am still monitoring my blood pressure. I am going to take my readings to my doctor when I go.    Objective:   ROS  Physical Exam  Encounter Medications:   Outpatient Encounter Prescriptions as of 04/10/2016  Medication Sig  . amiodarone (PACERONE) 100 MG tablet Take 1 tablet (100 mg total) by mouth daily.  Marland Kitchen apixaban (ELIQUIS) 5 MG TABS tablet Take 1 tablet (5 mg total) by mouth 2 (two) times daily.  Marland Kitchen atorvastatin (LIPITOR) 10 MG tablet Take 1 tablet (10 mg total) by mouth every morning.  . Blood Glucose Monitoring Suppl (ACCU-CHEK NANO SMARTVIEW) w/Device KIT USE TO CHECK BLOOD SUGAR FOUR TIMES PER DAY (NEEDS APPOINTMENT)  . carvedilol (COREG) 6.25 MG tablet Take 6.25 mg by mouth 2 (two) times daily with a meal.  . DHA-EPA-Coenzyme Q10-Vitamin E (CO Q-10 VITAMIN E FISH OIL PO) Take 1,000 mg by mouth 2 (two) times daily.  . furosemide (LASIX) 40 MG tablet Take 1 tablet (40 mg total) by mouth daily. (Patient taking differently: Take 40 mg by mouth. Every other day)  . glucose blood (ACCU-CHEK SMARTVIEW) test strip Use as instructed to check blood sugar 4 times per day dx code E11.9  . insulin aspart (NOVOLOG) 100 UNIT/ML injection Inject 3 Units into the skin 3 (three) times daily with meals.  . Insulin Glargine (TOUJEO SOLOSTAR) 300 UNIT/ML SOPN Inject 28 Units into the skin daily.  . Insulin Pen Needle 31G X 5 MM MISC 1 each by Does not apply route 4 (four) times daily -  before meals and at bedtime.  . Insulin Syringe-Needle U-100 (INSULIN SYRINGE .3CC/31GX5/16") 31G X 5/16" 0.3 ML MISC 1 each by Does not apply route 4 (four) times daily -  before meals and at bedtime.  Marland Kitchen lisinopril (PRINIVIL,ZESTRIL) 10 MG tablet Take 1 tablet (10 mg total) by  mouth at bedtime.  . Multiple Vitamin (MULTIVITAMIN) tablet Take 1 tablet by mouth daily.  . sildenafil (VIAGRA) 100 MG tablet Take 1 tablet (100 mg total) by mouth daily as needed for erectile dysfunction (1 hr prior to sexual activity).   No facility-administered encounter medications on file as of 04/10/2016.     Functional Status:  (reassesed on April 10, 2016) In your present state of health, do you have any difficulty performing the following activities: 01/04/2016 04/19/2015  Hearing? N N  Vision? N N  Difficulty concentrating or making decisions? N N  Walking or climbing stairs? Y Y  Dressing or bathing? N N  Doing errands, shopping? N N  Preparing Food and eating ? N -  Using the Toilet? N -  In the past six months, have you accidently leaked urine? N -  Do you have problems with loss of bowel control? N -  Managing your Medications? N -  Managing your Finances? N -  Housekeeping or managing your Housekeeping? N -  Some recent data might be hidden    Fall/Depression Screening:   (reassessed on April 10, 2016) PHQ 2/9 Scores 02/16/2016 01/04/2016 11/18/2015  PHQ - 2 Score 1 0 0  PHQ- 9 Score 2 - -    Assessment:    Fall/Depression screen completed and updated during the April 10, 2016 telephone contact Functional status completed and updated  on April 10, 2016   Plan:   Discharge patient from caseload due to his goals being met.     [facility]) Care Management  04/12/2016  Thomas Wright 1945-11-30 [id number]

## 2016-04-13 ENCOUNTER — Other Ambulatory Visit: Payer: Self-pay | Admitting: *Deleted

## 2016-04-13 NOTE — Patient Outreach (Signed)
[facility]) Care Management  04/13/2016  Thomas Wright 04-19-1946 [id number]   Return phone call from patient. Per patient, phone trouble was the cause of lack of contact.  Patient states that he has received approximately $600.00 to fund his prosthetic leg.  He goes for his fitting on 04/16/[facility].  Patient requesting assistance with getting a digital blood pressure cuff.  Patient has [employer] and may be able to use his insurance benefit to purchase Blood Pressure Cuff.  Patient has not been able to secure transportation to get the the Department of Social Services to apply for [employer] and food stamps.    Plan:  This Education officer, museum will assist patient in getting a blood pressure cuff using  his insurance benefit. Home visit scheduled for 04/23/16 at 9:30am to complete Medicaid and food stamp application.   [place], [place] [facility] [place], [place] Care Management [phone]

## 2016-04-17 ENCOUNTER — Other Ambulatory Visit: Payer: Self-pay | Admitting: *Deleted

## 2016-04-17 NOTE — Patient Outreach (Signed)
[facility]) Care Management  04/17/2016  Thomas Wright Jul 09, 1946 [id number]   Patient requested assistance with getting a digital blood pressure monitor [place] benefit discussed and contacted.  Patient has a $30.00 benefit, however the cost of the blood pressure monitor is $50.00.  Patient would be responsible to pay the $20.00 difference. Patient contacted and given the number to the [place] [phone].  Per patient, he will call to order the blood pressure monitor.   [name] [facility] Care Management [phone]

## 2016-04-19 ENCOUNTER — Encounter
Payer: Commercial Managed Care - HMO | Attending: Physical Medicine & Rehabilitation | Admitting: Physical Medicine & Rehabilitation

## 2016-04-19 ENCOUNTER — Encounter: Payer: Self-pay | Admitting: Physical Medicine & Rehabilitation

## 2016-04-19 VITALS — BP 133/82 | HR 79 | Resp 16

## 2016-04-19 DIAGNOSIS — I351 Nonrheumatic aortic (valve) insufficiency: Secondary | ICD-10-CM | POA: Insufficient documentation

## 2016-04-19 DIAGNOSIS — I110 Hypertensive heart disease with heart failure: Secondary | ICD-10-CM | POA: Diagnosis not present

## 2016-04-19 DIAGNOSIS — R269 Unspecified abnormalities of gait and mobility: Secondary | ICD-10-CM

## 2016-04-19 DIAGNOSIS — Z952 Presence of prosthetic heart valve: Secondary | ICD-10-CM | POA: Insufficient documentation

## 2016-04-19 DIAGNOSIS — R52 Pain, unspecified: Secondary | ICD-10-CM | POA: Insufficient documentation

## 2016-04-19 DIAGNOSIS — I5042 Chronic combined systolic (congestive) and diastolic (congestive) heart failure: Secondary | ICD-10-CM | POA: Diagnosis not present

## 2016-04-19 DIAGNOSIS — I480 Paroxysmal atrial fibrillation: Secondary | ICD-10-CM | POA: Diagnosis not present

## 2016-04-19 DIAGNOSIS — Z89511 Acquired absence of right leg below knee: Secondary | ICD-10-CM | POA: Diagnosis not present

## 2016-04-19 DIAGNOSIS — Z09 Encounter for follow-up examination after completed treatment for conditions other than malignant neoplasm: Secondary | ICD-10-CM | POA: Diagnosis not present

## 2016-04-19 DIAGNOSIS — E1142 Type 2 diabetes mellitus with diabetic polyneuropathy: Secondary | ICD-10-CM | POA: Diagnosis not present

## 2016-04-19 DIAGNOSIS — N289 Disorder of kidney and ureter, unspecified: Secondary | ICD-10-CM | POA: Diagnosis not present

## 2016-04-19 NOTE — Progress Notes (Signed)
Subjective:    Patient ID: Thomas Wright, male    DOB: [birth year], 70 y.o.   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   Last clinic visit 02/16/16.  Since that visit, he saw Nephrology and his PCP.  He recently had his casting done and goes back neck week.  He saw Vascular Surgery last week.  He completed HH therapies.  Using wheelchair for ambulation. He recently injured his right shoulder.  Otherwise, pt is doing well, denies problems. Denies falls.   Pain Inventory Average Pain 1 Pain Right Now 0 My pain is no pain  In the last 24 hours, has pain interfered with the following? General activity 0 Relation with others 0 Enjoyment of life 0 What TIME of day is your pain at its worst? no pain Sleep (in general) Good  Pain is worse with: no pain Pain improves with: no pain Relief from Meds: no pain  Mobility use a wheelchair  Function disabled: date disabled . I need assistance with the following:  shopping  Neuro/Psych No problems in this area tingling  Prior Studies Any changes since last visit?  no  Physicians involved in your care Any changes since last visit?  no   Family History  Problem Relation Age of Onset  . Colon cancer Father   . Diabetes Father   . Liver disease Brother   . Heart murmur Child   . Congenital heart disease Other   . Diabetes Maternal Grandmother   . Heart disease Maternal Grandfather     A Fib   Social History   Social History  . Marital status: Divorced    Spouse name: N/A  . Number of children: N/A  . Years of education: N/A   Occupational History  . retired     Social History Main Topics  . Smoking status: Never Smoker  . Smokeless tobacco: Never Used  . Alcohol use 0.0 oz/week     Comment: 04/19/2015 "might  have a beer or 2, 2-3 times/yr"  . Drug use: No  . Sexual activity: No   Other Topics Concern  . None   Social History Narrative  . None   Past Surgical History:  Procedure Laterality Date  . AMPUTATION Right 07/29/2014   Procedure: AMPUTATION RIGHT LONG FINGER;  Surgeon: [name], MD;  Location: [place];  Service: Orthopedics;  Laterality: Right;  . AMPUTATION Right 01/13/2016   Procedure: RIGHT BELOW KNEE AMPUTATION;  Surgeon: Newt Minion, MD;  Location: [place];  Service: Orthopedics;  Laterality: Right;  . AORTIC VALVE REPLACEMENT  05/2007   with tissue graft  . APPLICATION OF WOUND VAC Right 01/13/2016   Procedure: APPLICATION OF WOUND VAC;  Surgeon: Newt Minion, MD;  Location: [place];  Service: Orthopedics;  Laterality: Right;  . CARDIAC CATHETERIZATION  "several"  . CARDIAC VALVE REPLACEMENT    . CARDIOVERSION N/A 07/23/2014   Procedure: CARDIOVERSION;  Surgeon: Josue Hector, MD;  Location: [place];  Service: Cardiovascular;  Laterality: N/A;  . CARDIOVERSION N/A 09/01/2014   Procedure: CARDIOVERSION;  Surgeon: [name], MD;  Location: [facility] ENDOSCOPY;  Service: Cardiovascular;  Laterality: N/A;  . I&D EXTREMITY Right 05/05/2014   Procedure: IRRIGATION AND DEBRIDEMENT EXTREMITY;  Surgeon: [name], MD;  Location: [place];  Service: Orthopedics;  Laterality: Right;  .  TEE WITHOUT CARDIOVERSION N/A 07/23/2014   Procedure: TRANSESOPHAGEAL ECHOCARDIOGRAM (TEE);  Surgeon: Josue Hector, MD;  Location: [place];  Service: Cardiovascular;  Laterality: N/A;  . TEE WITHOUT CARDIOVERSION N/A 09/01/2014   Procedure: TRANSESOPHAGEAL ECHOCARDIOGRAM (TEE);  Surgeon: [name], MD;  Location: [facility] ENDOSCOPY;  Service: Cardiovascular;  Laterality: N/A;  . TONSILLECTOMY  1954   Past Medical History:  Diagnosis Date  . Arthritis    .  Marland Kitchen Cardiomyopathy ([place])    EF originally 20%-now 45%, no CAD on cath  . Chronic combined systolic and diastolic CHF (congestive heart  failure) ([place])    a. 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 Reported h/o cath in 2008 without significant CAD.  Marland Kitchen Depression    "a little right now" (04/19/2015)  . Hyperlipidemia   . Hypertension   . Hypertriglyceridemia   . PAF (paroxysmal atrial fibrillation) ([place])    a. Postop 2008. b. recurrent afib 08/2014 with LAA clot noted on TEE. EF was down again at 25%. He was anticoagulated with Eliquis and placed on Amiodarone.He ultimately underwent TEE-DCCV 08/2014.  . S/P aortic valve replacement with bioprosthetic valve    a. bioprosthetic AVR in 05/2007 in [place].  . Type II diabetes mellitus (HCC)    BP 133/82 (BP Location: Right Arm, Patient Position: Sitting, Cuff Size: Large)   Pulse 79   Resp 16   SpO2 97%   Opioid Risk Score:   Fall Risk Score:  `1  Depression screen PHQ 2/9  Depression screen [facility] 2/9 04/10/2016 02/16/2016 01/04/2016 11/18/2015  Decreased Interest 0 0 0 0  Down, Depressed, Hopeless 0 1 0 0  PHQ - 2 Score 0 1 0 0  Altered sleeping - 0 - -  Tired, decreased energy - 0 - -  Change in appetite - 0 - -  Feeling bad or failure about yourself  - 1 - -  Trouble concentrating - 0 - -  Moving slowly or fidgety/restless - 0 - -  Suicidal thoughts - 0 - -  PHQ-9 Score - 2 - -  Difficult doing work/chores - Somewhat difficult - -     Review of Systems  Constitutional: Negative.   HENT: Negative.   Eyes: Negative.   Respiratory: Negative.   Cardiovascular: Negative.   Gastrointestinal: Negative.   Endocrine: Negative.   Genitourinary: Negative.   Musculoskeletal: Positive for arthralgias (Right shoulder) and gait problem.  Skin: Negative.   Allergic/Immunologic: Negative.   Hematological: Negative.   Psychiatric/Behavioral: Negative.   All other systems reviewed and are negative.      Objective:   Physical Exam Constitutional: He appears well-developed. Obese. NAD HENT:  Normocephalic and atraumatic.   Eyes: Conjunctivae and EOM are normal.   Cardiovascular: Normal rate and regular rhythm.  no murmurs   Respiratory: Effort normal and breath sounds normal. No respiratory distress. No wheezes or rales. GI: Soft. Bowel sounds are normal. He exhibits no distension.  Musculoskeletal: He exhibits No tenderness, no edema.   Neurological: He is alert and oriented.  Motor: Bilateral upper extremity 5/5 proximal to distal Left lower extremity hip flexion, knee extension 5/5, ankle dorsi/plantar flexion 5/5 Right lower extremity hip flexion, Knee extension: 5/5  Skin:  BKA intact, almost healed Psychiatric: He has a normal mood and affect. 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    1.  S/p right below-knee amputation on 01/13/2016  Prosthesis next - restart therapies at that time.  Cont shrinker  Cont follow up with Vascular  Educated pt on skin checks with prosthesis  2. Abnormality of gait  Cont wheelchair for safety due to right shoulder injury  Advance to walker after prosthesis  Therapies after prosthesis

## 2016-04-23 ENCOUNTER — Other Ambulatory Visit: Payer: Self-pay | Admitting: *Deleted

## 2016-04-23 NOTE — Patient Outreach (Signed)
[facility]) Care Management  [facility] Social Work  04/23/2016  Thomas Wright 04-May-1946 [id number]  Subjective:    Objective:   Encounter Medications:  Outpatient Encounter Prescriptions as of 04/23/2016  Medication Sig  . amiodarone (PACERONE) 100 MG tablet Take 1 tablet (100 mg total) by mouth daily.  Marland Kitchen apixaban (ELIQUIS) 5 MG TABS tablet Take 1 tablet (5 mg total) by mouth 2 (two) times daily.  Marland Kitchen atorvastatin (LIPITOR) 10 MG tablet Take 1 tablet (10 mg total) by mouth every morning.  . Blood Glucose Monitoring Suppl (ACCU-CHEK NANO SMARTVIEW) w/Device KIT USE TO CHECK BLOOD SUGAR FOUR TIMES PER DAY (NEEDS APPOINTMENT)  . carvedilol (COREG) 6.25 MG tablet Take 6.25 mg by mouth 2 (two) times daily with a meal.  . DHA-EPA-Coenzyme Q10-Vitamin E (CO Q-10 VITAMIN E FISH OIL PO) Take 1,000 mg by mouth 2 (two) times daily.  Marland Kitchen glucose blood (ACCU-CHEK SMARTVIEW) test strip Use as instructed to check blood sugar 4 times per day dx code E11.9  . insulin aspart (NOVOLOG) 100 UNIT/ML injection Inject 3 Units into the skin 3 (three) times daily with meals.  . Insulin Glargine (TOUJEO SOLOSTAR) 300 UNIT/ML SOPN Inject 28 Units into the skin daily.  . Insulin Pen Needle 31G X 5 MM MISC 1 each by Does not apply route 4 (four) times daily -  before meals and at bedtime.  . Insulin Syringe-Needle U-100 (INSULIN SYRINGE .3CC/31GX5/16") 31G X 5/16" 0.3 ML MISC 1 each by Does not apply route 4 (four) times daily -  before meals and at bedtime.  Marland Kitchen lisinopril (PRINIVIL,ZESTRIL) 10 MG tablet Take 1 tablet (10 mg total) by mouth at bedtime.  . Multiple Vitamin (MULTIVITAMIN) tablet Take 1 tablet by mouth daily.  . sildenafil (VIAGRA) 100 MG tablet Take 1 tablet (100 mg total) by mouth daily as needed for erectile dysfunction (1 hr prior to sexual activity).  . furosemide (LASIX) 40 MG tablet Take 1 tablet (40 mg total) by mouth daily. (Patient not taking: Reported on 04/23/2016)   No  facility-administered encounter medications on file as of 04/23/2016.     Functional Status:  In your present state of health, do you have any difficulty performing the following activities: 04/10/2016 01/04/2016  Hearing? Y N  Vision? N N  Difficulty concentrating or making decisions? Y N  Walking or climbing stairs? N Y  Dressing or bathing? N N  Doing errands, shopping? Y N  Preparing Food and eating ? N N  Using the Toilet? N N  In the past six months, have you accidently leaked urine? N N  Do you have problems with loss of bowel control? N N  Managing your Medications? N N  Managing your Finances? N N  Housekeeping or managing your Housekeeping? N N  Some recent data might be hidden    Fall/Depression Screening:  PHQ 2/9 Scores 04/10/2016 02/16/2016 01/04/2016 11/18/2015  PHQ - 2 Score 0 1 0 0  PHQ- 9 Score - 2 - -    Assessment: Home visit to patient's home.  Blood Pressure Monitor provided.  Patient able to demonstrate use of blood pressure monitor.  Application for  Medicaid and Food Stamps completed to be submitted to the Department of [place].  Patient not receiving HH at this time.  04/25/16 patient to go  back to [name] for final fitting for prosthetic leg Plan:   Follow up on status of food stamp and medicaid application within 30 days.  [name] [facility] Care Management [phone]

## 2016-05-04 DIAGNOSIS — Z89511 Acquired absence of right leg below knee: Secondary | ICD-10-CM | POA: Diagnosis not present

## 2016-05-04 DIAGNOSIS — I509 Heart failure, unspecified: Secondary | ICD-10-CM | POA: Diagnosis not present

## 2016-05-05 DIAGNOSIS — Z89511 Acquired absence of right leg below knee: Secondary | ICD-10-CM | POA: Diagnosis not present

## 2016-05-05 DIAGNOSIS — I509 Heart failure, unspecified: Secondary | ICD-10-CM | POA: Diagnosis not present

## 2016-05-07 DIAGNOSIS — Z89511 Acquired absence of right leg below knee: Secondary | ICD-10-CM | POA: Diagnosis not present

## 2016-05-07 DIAGNOSIS — M7541 Impingement syndrome of right shoulder: Secondary | ICD-10-CM | POA: Diagnosis not present

## 2016-05-09 DIAGNOSIS — Z89511 Acquired absence of right leg below knee: Secondary | ICD-10-CM | POA: Diagnosis not present

## 2016-05-18 ENCOUNTER — Other Ambulatory Visit: Payer: Self-pay | Admitting: *Deleted

## 2016-05-18 ENCOUNTER — Encounter: Payer: Self-pay | Admitting: *Deleted

## 2016-05-18 NOTE — Patient Outreach (Signed)
[facility]) Care Management  [facility] Social Work  05/18/2016  DARRYL WILLNER 10/25/1945 [id number]  Subjective:   Patient is a 70 year old male.  Per patient, he has received his prosthetic leg and has been assigned to a Medicaid eligibility worker through the Department of  [place].  Objective:   Encounter Medications:  Outpatient Encounter Prescriptions as of 05/18/2016  Medication Sig  . amiodarone (PACERONE) 100 MG tablet Take 1 tablet (100 mg total) by mouth daily.  Marland Kitchen apixaban (ELIQUIS) 5 MG TABS tablet Take 1 tablet (5 mg total) by mouth 2 (two) times daily.  Marland Kitchen atorvastatin (LIPITOR) 10 MG tablet Take 1 tablet (10 mg total) by mouth every morning.  . Blood Glucose Monitoring Suppl (ACCU-CHEK NANO SMARTVIEW) w/Device KIT USE TO CHECK BLOOD SUGAR FOUR TIMES PER DAY (NEEDS APPOINTMENT)  . carvedilol (COREG) 6.25 MG tablet Take 6.25 mg by mouth 2 (two) times daily with a meal.  . DHA-EPA-Coenzyme Q10-Vitamin E (CO Q-10 VITAMIN E FISH OIL PO) Take 1,000 mg by mouth 2 (two) times daily.  Marland Kitchen glucose blood (ACCU-CHEK SMARTVIEW) test strip Use as instructed to check blood sugar 4 times per day dx code E11.9  . insulin aspart (NOVOLOG) 100 UNIT/ML injection Inject 3 Units into the skin 3 (three) times daily with meals.  . Insulin Glargine (TOUJEO SOLOSTAR) 300 UNIT/ML SOPN Inject 28 Units into the skin daily.  . Insulin Pen Needle 31G X 5 MM MISC 1 each by Does not apply route 4 (four) times daily -  before meals and at bedtime.  . Insulin Syringe-Needle U-100 (INSULIN SYRINGE .3CC/31GX5/16") 31G X 5/16" 0.3 ML MISC 1 each by Does not apply route 4 (four) times daily -  before meals and at bedtime.  Marland Kitchen lisinopril (PRINIVIL,ZESTRIL) 10 MG tablet Take 1 tablet (10 mg total) by mouth at bedtime.  . Multiple Vitamin (MULTIVITAMIN) tablet Take 1 tablet by mouth daily.  . furosemide (LASIX) 40 MG tablet Take 1 tablet (40 mg total) by mouth daily. (Patient not  taking: Reported on 04/23/2016)  . sildenafil (VIAGRA) 100 MG tablet Take 1 tablet (100 mg total) by mouth daily as needed for erectile dysfunction (1 hr prior to sexual activity).   No facility-administered encounter medications on file as of 05/18/2016.     Functional Status:  In your present state of health, do you have any difficulty performing the following activities: 04/10/2016 01/04/2016  Hearing? Y N  Vision? N N  Difficulty concentrating or making decisions? Y N  Walking or climbing stairs? N Y  Dressing or bathing? N N  Doing errands, shopping? Y N  Preparing Food and eating ? N N  Using the Toilet? N N  In the past six months, have you accidently leaked urine? N N  Do you have problems with loss of bowel control? N N  Managing your Medications? N N  Managing your Finances? N N  Housekeeping or managing your Housekeeping? N N  Some recent data might be hidden    Fall/Depression Screening:  PHQ 2/9 Scores 04/10/2016 02/16/2016 01/04/2016 11/18/2015  PHQ - 2 Score 0 1 0 0  PHQ- 9 Score - 2 - -    Assessment: Patient received prosthetic leg approximatley 2 weeks ago.  Per patient, he cannot walk on it yet but was told to stand on it for at least 45 minute twice per day.  Patient now waiting on authorization for [facility] physical [facility]( [facility]  Per  patient, he has a friend that will transport him physical therapy.  Patient has been approved for food stamps $106.00 per month.  Patient has been assigned a Medicaid Worker, Elta Guadeloupe  [phone]   Per patient, Elta Guadeloupe  has sent the list of items  needed to make final the final decision regarding his Medicaid eligibility and patient is now working on gathering the information needed to send in. Per patient, he does not need assistance to complete this process.  Patient provided with the Contact information for the [place].  First Tuesday of every month at 7pm at the [place] [phone]  Plan:  Patient verbalized no further community resource needs. Contact information provided to patient if any social work need arise in the future.   [name] [facility] Care Management [phone]

## 2016-06-04 DIAGNOSIS — Z89511 Acquired absence of right leg below knee: Secondary | ICD-10-CM | POA: Diagnosis not present

## 2016-06-04 DIAGNOSIS — I509 Heart failure, unspecified: Secondary | ICD-10-CM | POA: Diagnosis not present

## 2016-06-05 ENCOUNTER — Encounter: Payer: Self-pay | Admitting: Physical Therapy

## 2016-06-05 ENCOUNTER — Ambulatory Visit: Payer: Commercial Managed Care - HMO | Attending: Orthopedic Surgery | Admitting: Physical Therapy

## 2016-06-05 DIAGNOSIS — R2689 Other abnormalities of gait and mobility: Secondary | ICD-10-CM | POA: Diagnosis not present

## 2016-06-05 DIAGNOSIS — Z89511 Acquired absence of right leg below knee: Secondary | ICD-10-CM | POA: Diagnosis not present

## 2016-06-05 DIAGNOSIS — M6281 Muscle weakness (generalized): Secondary | ICD-10-CM | POA: Diagnosis not present

## 2016-06-05 DIAGNOSIS — R29898 Other symptoms and signs involving the musculoskeletal system: Secondary | ICD-10-CM

## 2016-06-05 DIAGNOSIS — R2681 Unsteadiness on feet: Secondary | ICD-10-CM | POA: Insufficient documentation

## 2016-06-05 DIAGNOSIS — I509 Heart failure, unspecified: Secondary | ICD-10-CM | POA: Diagnosis not present

## 2016-06-05 NOTE — Therapy (Signed)
[facility] [address] [place], [place], [address] Phone: [phone]   Fax:  [phone]  Physical Therapy Evaluation  Patient Details  Name: Thomas Wright MRN: [id number] Date of Birth: 10/23/1945 Referring Provider: [name], MD  Encounter Date: 06/05/2016      PT End of Session - 06/05/16 1348    Visit Number 1   Number of Visits 18   Date for PT Re-Evaluation 08/03/16   Authorization Type Medicare G-Code & Progress Note   PT Start Time 0933   PT Stop Time 1020   PT Time Calculation (min) 47 min   Equipment Utilized During Treatment Gait belt   Activity Tolerance Patient tolerated treatment well   Behavior During Therapy [facility] for tasks assessed/performed      Past Medical History:  Diagnosis Date  . Arthritis    .  Marland Kitchen Cardiomyopathy ([place])    EF originally 20%-now 45%, no CAD on cath  . Chronic combined systolic and diastolic CHF (congestive heart failure) ([place])    a. 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 Reported h/o cath in 2008 without significant CAD.  Marland Kitchen Depression    "a little right now" (04/19/2015)  . Hyperlipidemia   . Hypertension   . Hypertriglyceridemia   . PAF (paroxysmal atrial fibrillation) ([place])    a. Postop 2008. b. recurrent afib 08/2014 with LAA clot noted on TEE. EF was down again at 25%. He was anticoagulated with Eliquis and placed on Amiodarone.He ultimately underwent TEE-DCCV 08/2014.  . S/P aortic valve replacement with bioprosthetic valve    a. bioprosthetic AVR in 05/2007 in [place].  . Type II diabetes mellitus ([place])     Past Surgical History:  Procedure Laterality Date  . AMPUTATION Right 07/29/2014   Procedure: AMPUTATION RIGHT LONG FINGER;  Surgeon: [name], MD;  Location: [facility];  Service: Orthopedics;  Laterality: Right;  . AMPUTATION Right 01/13/2016   Procedure: RIGHT BELOW KNEE  AMPUTATION;  Surgeon: Newt Minion, MD;  Location: [facility];  Service: Orthopedics;  Laterality: Right;  . AORTIC VALVE REPLACEMENT  05/2007   with tissue graft  . APPLICATION OF WOUND VAC Right 01/13/2016   Procedure: APPLICATION OF WOUND VAC;  Surgeon: Newt Minion, MD;  Location: [facility];  Service: Orthopedics;  Laterality: Right;  . CARDIAC CATHETERIZATION  "several"  . CARDIAC VALVE REPLACEMENT    . CARDIOVERSION N/A 07/23/2014   Procedure: CARDIOVERSION;  Surgeon: Josue Hector, MD;  Location: [facility];  Service: Cardiovascular;  Laterality: N/A;  . CARDIOVERSION N/A 09/01/2014   Procedure: CARDIOVERSION;  Surgeon: [name], MD;  Location: [facility] END[facility];  Service: Cardiovascular;  Laterality: N/A;  . I&D EXTREMITY Right 05/05/2014   Procedure: IRRIGATION AND DEBRIDEMENT EXTREMITY;  Surgeon: [name], MD;  Location: [place];  Service: Orthopedics;  Laterality: Right;  . TEE WITHOUT CARDIOVERSION N/A 07/23/2014   Procedure: TRANSESOPHAGEAL ECHOCARDIOGRAM (TEE);  Surgeon: Josue Hector, MD;  Location: [facility];  Service: Cardiovascular;  Laterality: N/A;  . TEE WITHOUT CARDIOVERSION N/A 09/01/2014   Procedure: TRANSESOPHAGEAL ECHOCARDIOGRAM (TEE);  Surgeon: [name], MD;  Location[facility];  Service: Cardiovascular;  Laterality: N/A;  . TONSILLECTOMY  1952    There were no vitals filed for this visit.       Subjective Assessment - 06/05/16 0940    Subjective This 70yo male who developed diabetic wound after "bumping  foot" He underwent a right Transtibial Amputation on 01/13/2016 with hospitalization from 01/09/2016 transfer to Inpatient rehab 01/17/2016 and discharge 02/03/2016. He recieved his first prosthesis on 05/09/2016 and is dependent in use & care. He presents for PT evaluation with prosthesis.    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 No/denies            [facility] PT Assessment - 06/05/16 0930      Assessment   Medical Diagnosis Right Transtibial Amputation   Referring Provider [name], MD   Onset Date/Surgical Date 05/09/16  Prosthesis delivery   Hand Dominance Left     Precautions   Precautions Fall     Restrictions   Weight Bearing Restrictions No     Balance Screen   Has the patient fallen in the past 6 months Yes   How many times? 1  In hospital slipped to floor, no injuries   Has the patient had a decrease in activity level because of a fear of falling?  Yes   Is the patient reluctant to leave their home because of a fear of falling?  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, sales mattress   Leisure bowl, fish, swim     Observation/Other Assessments   Focus on Therapeutic Outcomes (FOTO)  43.04 Functional Status   Fear Avoidance Belief Questionnaire (FABQ)  58 (15)     Sensation   Light Touch Impaired by gross assessment  neuropathy in BLEs below knee area & hands     Posture/Postural Control   Posture/Postural Control Postural limitations   Postural Limitations Rounded Shoulders;Forward head;Flexed trunk;Weight shift left     ROM / Strength   AROM / PROM / Strength AROM;Strength     AROM   Overall AROM  Within functional  limits for tasks performed     Strength   Overall Strength Deficits   Overall Strength Comments RUE 3-/5 RCT, LUE 5/5, fair grip,    Strength Assessment Site Hip;Knee;Ankle   Right/Left Hip Right;Left   Right Hip Flexion 3+/5   Right Hip Extension 3-/5  tested standing with BUE support   Right Hip ABduction 3+/5  tested standing with BUE support   Left Hip Flexion 4/5   Left Hip Extension 4-/5  tested standing with BUE support   Left Hip ABduction 4/5  tested standing with BUE support   Right/Left Knee Right;Left   Right Knee Flexion 4/5   Right Knee Extension 4/5   Left Knee Flexion 4/5   Left Knee Extension 4/5   Right/Left Ankle Left   Left Ankle Dorsiflexion 4/5   Left Ankle Plantar Flexion 2+/5     Transfers   Transfers Sit to Stand;Stand to  Sit   Sit to Stand 4: Min assist;With upper extremity assist;With armrests;From chair/3-in-1;Multiple attempts   Stand to Sit 5: Supervision;With upper extremity assist;With armrests;To chair/3-in-1;Uncontrolled descent     Ambulation/Gait   Ambulation/Gait Yes   Ambulation/Gait Assistance 4: Min guard;5: Supervision   Ambulation/Gait Assistance Details excessive UE weight bearing on RW   Ambulation Distance (Feet) 75 Feet   Assistive device Prosthesis;Rolling walker   Gait Pattern Step-through pattern;Decreased step length - left;Decreased stance time - right;Decreased stride length;Decreased hip/knee flexion - right;Decreased weight shift to right;Right hip hike;Left foot flat;Trunk flexed;Abducted- right;Poor foot clearance - left;Poor foot clearance - right   Ambulation Surface Indoor;Level   Gait velocity 1.49 ft/sec     Standardized Balance Assessment   Standardized Balance Assessment Berg Balance Test;Timed Up and Go Test     Berg Balance Test   Sit to Stand Needs minimal aid to stand or to stabilize   Standing Unsupported Needs several tries to stand 30 seconds unsupported   Sitting with Back Unsupported but Feet  Supported on Floor or Stool Able to sit safely and securely 2 minutes   Stand to Sit Sits independently, has uncontrolled descent   Transfers Able to transfer safely, definite need of hands   Standing Unsupported with Eyes Closed Needs help to keep from falling   Standing Ubsupported with Feet Together Needs help to attain position and unable to hold for 15 seconds   From Standing, Reach Forward with Outstretched Arm Loses balance while trying/requires external support   From Standing Position, Pick up Object from Floor Unable to try/needs assist to keep balance   From Standing Position, Turn to Look Behind Over each Shoulder Needs assist to keep from losing balance and falling   Turn 360 Degrees Needs assistance while turning   Standing Unsupported, Alternately Place Feet on Step/Stool Needs assistance to keep from falling or unable to try   Standing Unsupported, One Foot in [employer] balance while stepping or standing   Standing on One Leg Unable to try or needs assist to prevent fall   Total Score 10     Timed Up and Go Test   Normal TUG (seconds) 35.12  RW          Prosthetics Assessment - 06/05/16 0930      Prosthetics   Prosthetic Care Dependent with Skin check;Residual limb care;Care of non-amputated limb;Prosthetic cleaning;Ply sock cleaning;Correct ply sock adjustment;Proper wear schedule/adjustment;Proper weight-bearing schedule/adjustment   Donning prosthesis  Supervision   Doffing prosthesis  Supervision   Current prosthetic wear tolerance (days/week)  daily since delivery 21 days ago   Current prosthetic wear tolerance (#hours/day)  45 min up to 4 hrs 1x/day   Edema non-pitting,   Residual limb condition  cylinderical, ~1" soft tissue distal to tibia, no open areas but 2 small spots that appear to be underlying sutures working out, dry skin, normal color & temperature, minimal hair growth   K code/activity level with prosthetic use  K3 full community with variable  cadence                  [facility] Adult PT Treatment/Exercise - 06/05/16 0930      Prosthetics   Prosthetic Care Comments  PT instructed to initiate wear 4hrs 2x/day and increase 1hr /wear q5days if no issues. Use of antiperspirant, [employer] Strength on limb.    Education Provided Skin check;Residual limb care;Prosthetic cleaning;Ply sock cleaning;Correct ply sock adjustment;Proper Donning;Proper Doffing;Proper wear schedule/adjustment;Proper weight-bearing schedule/adjustment   Person(s)  Educated Patient   Education Method Explanation;Demonstration;Tactile cues;Verbal cues   Education Method Verbalized understanding;Verbal cues required;Returned demonstration;Tactile cues required;Needs further instruction   Donning Prosthesis Supervision  cues on proper technique   Doffing Prosthesis Supervision  cues on proper technique                PT Education - 06/05/16 0930    Education provided Yes   Education Details Driving options with Transtibial Amputation of RLE   Person(s) Educated Patient   Methods Explanation;Demonstration;Verbal cues   Comprehension Verbalized understanding;Need further instruction          PT Short Term Goals - 06/05/16 1509      PT SHORT TERM GOAL #1   Title Patient demonstrates proper donning & verbalizes proper cleaning of prosthesis. (Target Date: 07/05/2016)   Time 1   Period Months   Status New     PT SHORT TERM GOAL #2   Title Patient demonstrates understanding of initial HEP.  (Target Date: 07/05/2016)   Time 1   Period Months   Status New     PT SHORT TERM GOAL #3   Title Patient tolerates wear of prosthesis >10 hrs total per day without skin issues or limb pain.  (Target Date: 07/05/2016)   Time 1   Period Months   Status New     PT SHORT TERM GOAL #4   Title Patient ambulates 200' with RW & prosthesis with supervision.  (Target Date: 07/05/2016)   Time 1   Period Months   Status New     PT SHORT TERM GOAL #5    Title Patient negotiates ramps, curbs with RW & stairs with 2 rails with supervision.  (Target Date: 07/05/2016)   Time 1   Period Months   Status New     Additional Short Term Goals   Additional Short Term Goals Yes     PT SHORT TERM GOAL #6   Title Patient able to reach 10" and to floor with UE support on RW safely.  (Target Date: 07/05/2016)   Time 1   Period Months   Status New           PT Long Term Goals - 06/05/16 1501      PT LONG TERM GOAL #1   Title Patient tolerates wear of prosthesis all awake hours without skin issues or limb tenderness to enable function throughout his day. (Target Date: 08/03/2016)   Time 2   Period Months   Status New     PT LONG TERM GOAL #2   Title Patient verbalizes & demonstrates proper prosthetic care & use to enable safe use of prosthesis. (Target Date: 08/03/2016)   Time 2   Period Months   Status New     PT LONG TERM GOAL #3   Title Patient ambulates with LRAD & prosthesis 500' including outside surfaces modified independent to enable community mobility. (Target Date: 08/03/2016)   Time 2   Period Months   Status New     PT LONG TERM GOAL #4   Title Patient negotiates ramps, curbs & stairs with LRAD & prosthesis to enable community access. (Target Date: 08/03/2016)   Time 2   Period Months   Status New     PT LONG TERM GOAL #5   Title Berg Balance >36/56 to indicate lower fall risk. (Target Date: 08/03/2016)   Time 2   Period Months   Status New     Additional Long Term Goals  Additional Long Term Goals Yes     PT LONG TERM GOAL #6   Title Timed Up-Go with LRAD <27 seconds safely to indicate lower fall risk. 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 He underwent a right Transtibial Amputation  01/13/2016. He recieved his first prosthesis and is dependent in safe use & care limiting function. He has limited wear and needs skilled care to progress to most of awake hours to enable function throughout his day. He needs skilled instruction to minimize risk of skin issues or limb tenderness. His balance is impaired as noted by [employer] 10/56 and Timed Up-Go 35.12sec both indicating high fall risk & ADL dependency. His gait is dependent on RW & intermittent minA for safety. His gait velocity of 1.32ft/sec and deviations indicate fall risk. His condition is evolving and plan of care is moderate complexity.                          Rehab Potential Good   PT Frequency 2x / week   PT Duration Other (comment)  9 weeks (60 days)   PT Treatment/Interventions ADLs/Self Care Home Management;DME Instruction;Gait training;Stair training;Functional mobility training;Therapeutic activities;Balance training;Neuromuscular re-education;Patient/family education;Prosthetic Training   PT Next Visit Plan review prosthetic care, HEP mid-line sink, gait with RW & prosthesis   Recommended Other Services may need AFO on left LE due to foot drop/ slap   Consulted and Agree with Plan of Care Patient      Patient will benefit from skilled therapeutic intervention in order to improve the following deficits and impairments:  Abnormal gait, Decreased activity tolerance, Decreased balance, Decreased endurance, Decreased knowledge of use of DME, Decreased mobility, Decreased strength, Postural dysfunction, Prosthetic Dependency  Visit Diagnosis: Other abnormalities of gait and mobility  Unsteadiness on feet  Muscle weakness (generalized)  Other symptoms and signs involving the musculoskeletal system      G-Codes - 2016-06-20 1514    Functional Assessment Tool Used Patient is dependent in prosthetic care. He is wearing prosthesis daily since delivery 21 days ago for 15min up to 4 hrs.    Functional Limitation Self  care   Self Care Current Status [phone]) At least 60 percent but less than 80 percent impaired, limited or restricted   Self Care Goal Status [id number]) At least 1 percent but less than 20 percent impaired, limited or restricted       Problem List Patient Active Problem List   Diagnosis Date Noted  . Fluid retention   . Labile blood glucose   . Toe erythema   . Hypoglycemia associated with type 2 diabetes mellitus ([place])   . Hyponatremia   . Diarrhea 01/17/2016  . Amputation of right lower extremity below knee with complication ([place]) [id number]  . AKI (acute kidney injury) ([place])   . Type 2 diabetes mellitus with peripheral neuropathy ([facility])   . Status post aortic valve replacement   . Chronic combined systolic and diastolic congestive heart failure ([place])   . ATN (acute tubular necrosis) ([place])   . Postoperative pain   . Acute blood loss anemia   . Leukocytosis   . Morbid obesity ([place])   . Status  post below knee amputation of right lower extremity ([place])   . Insulin dependent diabetes mellitus ([place]) 01/09/2016  . Diabetic foot infection ([place]) 01/09/2016  . Sepsis, unspecified organism ([place]) 01/09/2016  . PAF (paroxysmal atrial fibrillation) ([place]) 04/22/2015  . Essential hypertension 04/22/2015  . Obesity 04/22/2015  . Dyspnea 04/20/2015  . Acute on chronic diastolic CHF (congestive heart failure) ([place]) 04/19/2015  . Cellulitis of leg 11/23/2014  . Diabetes ([place]) 09/13/2014  . Chronic anticoagulation 09/13/2014  . Atrial flutter, unspecified   . Paroxysmal atrial fibrillation ([facility])   . Perirectal abscess   . Other hemorrhoids   . Tenosynovitis of finger 05/05/2014  . Cellulitis and abscess of digit 05/05/2014  . Back pain   . H/O aortic valve replacement with tissue graft   . Hypertriglyceridemia   . Hypertension     Jhovany Weidinger PT, DPT 06/05/2016, 3:15 PM  [facility] [address] [place], [place],  [address] Phone: [phone]   Fax:  [phone]  Name: ISIDRO NEWSWANGER MRN: [id number] Date of Birth: [birth year]

## 2016-06-11 ENCOUNTER — Ambulatory Visit: Payer: Commercial Managed Care - HMO | Admitting: Physical Therapy

## 2016-06-11 DIAGNOSIS — R2689 Other abnormalities of gait and mobility: Secondary | ICD-10-CM | POA: Diagnosis not present

## 2016-06-11 DIAGNOSIS — R29898 Other symptoms and signs involving the musculoskeletal system: Secondary | ICD-10-CM

## 2016-06-11 DIAGNOSIS — M6281 Muscle weakness (generalized): Secondary | ICD-10-CM | POA: Diagnosis not present

## 2016-06-11 DIAGNOSIS — R2681 Unsteadiness on feet: Secondary | ICD-10-CM | POA: Diagnosis not present

## 2016-06-11 NOTE — Patient Instructions (Signed)
Do each exercise 1-2  times per day Do each exercise 10 repetitions Hold each exercise for 5 seconds to feel your location  AT SINK FIND YOUR MIDLINE POSITION AND PLACE FEET EQUAL DISTANCE FROM THE MIDLINE.  USE TAPE ON FLOOR TO MARK THE MIDLINE POSITION. You also should try to feel with your limb pressure in socket.  You are trying to feel with limb what you used to feel with the bottom of your foot.  1. Side to Side Shift: Moving your hips only (not shoulders): move weight onto your left leg, HOLD/FEEL.  Move back to equal weight on each leg, HOLD/FEEL. Move weight onto your right leg, HOLD/FEEL. Move back to equal weight on each leg, HOLD/FEEL. Repeat. 2. Front to Back Shift: Moving your hips only (not shoulders): move your weight forward onto your toes, HOLD/FEEL. Move your weight back to equal Flat Foot on both legs, HOLD/FEEL. Move your weight back onto your heels, HOLD/FEEL. Move your weight back to equal on both legs, HOLD/FEEL. Repeat. 3. Moving Cones / Cups: With equal weight on each leg: Hold on with one hand the first time, then progress to no hand supports. Move cups from one side of sink to the other. Place cups ~2" out of your reach, progress to 10" beyond reach. 4. Overhead/Upward Reaching: alternated reaching up to top cabinets or ceiling if no cabinets present. Keep equal weight on each leg. Start with one hand support on counter while other hand reaches and progress to no hand support with reaching. 5.   Looking Over Shoulders: With equal weight on each leg: alternate turning to look          over your shoulders with one hand support on counter as needed. Shift weight to             side looking, pull hip then shoulder then head/eyes around to look behind you. Start       with one hand support & progress to no hand support. 

## 2016-06-11 NOTE — Therapy (Signed)
[facility] [address] [place], [place], [address] Phone: [phone]   Fax:  [phone]  Physical Therapy Treatment  Patient Details  Name: Thomas Wright MRN: [id number] Date of Birth: June 19, 1946 Referring Provider: [name], MD  Encounter Date: 06/11/2016      PT End of Session - 06/11/16 1405    Visit Number 2   Number of Visits 18   Date for PT Re-Evaluation 08/03/16   Authorization Type Medicare G-Code & Progress Note   PT Start Time 1317   PT Stop Time 1400   PT Time Calculation (min) 43 min   Equipment Utilized During Treatment Gait belt   Activity Tolerance Patient tolerated treatment well   Behavior During Therapy WFL for tasks assessed/performed      Past Medical History:  Diagnosis Date  . Arthritis    .  Marland Kitchen Cardiomyopathy ([place])    EF originally 20%-now 45%, no CAD on cath  . Chronic combined systolic and diastolic CHF (congestive heart failure) ([place])    a. 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 Reported h/o cath in 2008 without significant CAD.  Marland Kitchen Depression    "a little right now" (04/19/2015)  . Hyperlipidemia   . Hypertension   . Hypertriglyceridemia   . PAF (paroxysmal atrial fibrillation) ([place])    a. Postop 2008. b. recurrent afib 08/2014 with LAA clot noted on TEE. EF was down again at 25%. He was anticoagulated with Eliquis and placed on Amiodarone.He ultimately underwent TEE-DCCV 08/2014.  . S/P aortic valve replacement with bioprosthetic valve    a. bioprosthetic AVR in 05/2007 in [place].  . Type II diabetes mellitus ([place])     Past Surgical History:  Procedure Laterality Date  . AMPUTATION Right 07/29/2014   Procedure: AMPUTATION RIGHT LONG FINGER;  Surgeon: [name], MD;  Location: [facility];  Service: Orthopedics;  Laterality: Right;  . AMPUTATION Right 01/13/2016   Procedure: RIGHT BELOW KNEE  AMPUTATION;  Surgeon: Newt Minion, MD;  Location: [facility];  Service: Orthopedics;  Laterality: Right;  . AORTIC VALVE REPLACEMENT  05/2007   with tissue graft  . APPLICATION OF WOUND VAC Right 01/13/2016   Procedure: APPLICATION OF WOUND VAC;  Surgeon: Newt Minion, MD;  Location: [facility];  Service: Orthopedics;  Laterality: Right;  . CARDIAC CATHETERIZATION  "several"  . CARDIAC VALVE REPLACEMENT    . CARDIOVERSION N/A 07/23/2014   Procedure: CARDIOVERSION;  Surgeon: Josue Hector, MD;  Location: [facility];  Service: Cardiovascular;  Laterality: N/A;  . CARDIOVERSION N/A 09/01/2014   Procedure: CARDIOVERSION;  Surgeon: [name], MD;  Location: [facility] ENDOSCOPY;  Service: Cardiovascular;  Laterality: N/A;  . I&D EXTREMITY Right 05/05/2014   Procedure: IRRIGATION AND DEBRIDEMENT EXTREMITY;  Surgeon: [name], MD;  Location: [facility];  Service: Orthopedics;  Laterality: Right;  . TEE WITHOUT CARDIOVERSION N/A 07/23/2014   Procedure: TRANSESOPHAGEAL ECHOCARDIOGRAM (TEE);  Surgeon: Josue Hector, MD;  Location: [facility];  Service: Cardiovascular;  Laterality: N/A;  . TEE WITHOUT CARDIOVERSION N/A 09/01/2014   Procedure: TRANSESOPHAGEAL ECHOCARDIOGRAM (TEE);  Surgeon: [name], MD;  Location: [facility] ENDOSCOPY;  Service: Cardiovascular;  Laterality: N/A;  . TONSILLECTOMY  1954    There were no vitals filed for this visit.      Subjective Assessment - 06/11/16 1322    Subjective Pt drove himself today.   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 No/denies                         [facility] Adult PT Treatment/Exercise - 06/11/16 0001      Transfers   Transfers Sit to Stand;Stand to Sit   Sit to Stand 5: Supervision   Sit to Stand Details Verbal cues for  precautions/safety     Ambulation/Gait   Ambulation/Gait Yes   Ambulation/Gait Assistance 5: Supervision   Ambulation/Gait Assistance Details increasesd walker height for posture and decrease UE weight bearing                           Ambulation Distance (Feet) 200 Feet  +100   Assistive device Prosthesis;Rolling walker   Gait Pattern Step-through pattern;Decreased step length - left;Decreased stance time - right;Decreased stride length;Decreased hip/knee flexion - right;Decreased weight shift to right;Right hip hike;Left foot flat;Trunk flexed;Abducted- right;Poor foot clearance - left;Poor foot clearance - right   Ambulation Surface Level;Outdoor     Prosthetics   Prosthetic Care Comments  PT instructed to initiate wear 5hrs 2x/day and increase 1hr /wear q5days if no issues. Use of antiperspirant, Secret Clinical Strength on limb.              Balance Exercises - 06/11/16 1416      Balance Exercises: Standing   Other Standing Exercises Working on midline awareness and balance with weight shifting with intermittent UE support ; see pt instruction.                        PT Education - 06/11/16 1348    Education provided Yes   Education Details HEP at the sink   Person(s) Educated Patient   Methods Explanation;Demonstration;Tactile cues;Verbal cues;Handout   Comprehension Verbalized understanding;Returned demonstration;Need further instruction          PT Short Term Goals - 06/05/16 1509      PT SHORT TERM GOAL #1   Title Patient demonstrates proper donning & verbalizes proper cleaning of prosthesis. (Target Date: 07/05/2016)   Time 1   Period Months   Status New     PT SHORT TERM GOAL #2   Title Patient demonstrates understanding of initial HEP.  (Target Date: 07/05/2016)   Time 1   Period Months   Status New     PT SHORT TERM GOAL #3   Title Patient tolerates wear of prosthesis >10 hrs total per day without skin issues or limb pain.  (Target Date:  07/05/2016)   Time 1   Period Months   Status New     PT SHORT TERM GOAL #4   Title Patient ambulates 200' with RW & prosthesis with supervision.  (Target Date: 07/05/2016)   Time 1   Period Months   Status New     PT SHORT TERM GOAL #5   Title Patient negotiates ramps, curbs with RW & stairs with 2 rails with supervision.  (Target Date: 07/05/2016)   Time 1   Period Months   Status New     Additional Short Term Goals   Additional Short Term Goals Yes     PT SHORT TERM GOAL #6   Title Patient able to reach 10" and to  floor with UE support on RW safely.  (Target Date: 07/05/2016)   Time 1   Period Months   Status New           PT Long Term Goals - 06/05/16 1501      PT LONG TERM GOAL #1   Title Patient tolerates wear of prosthesis all awake hours without skin issues or limb tenderness to enable function throughout his day. (Target Date: 08/03/2016)   Time 2   Period Months   Status New     PT LONG TERM GOAL #2   Title Patient verbalizes & demonstrates proper prosthetic care & use to enable safe use of prosthesis. (Target Date: 08/03/2016)   Time 2   Period Months   Status New     PT LONG TERM GOAL #3   Title Patient ambulates with LRAD & prosthesis 500' including outside surfaces modified independent to enable community mobility. (Target Date: 08/03/2016)   Time 2   Period Months   Status New     PT LONG TERM GOAL #4   Title Patient negotiates ramps, curbs & stairs with LRAD & prosthesis to enable community access. (Target Date: 08/03/2016)   Time 2   Period Months   Status New     PT LONG TERM GOAL #5   Title Berg Balance >36/56 to indicate lower fall risk. (Target Date: 08/03/2016)   Time 2   Period Months   Status New     Additional Long Term Goals   Additional Long Term Goals Yes     PT LONG TERM GOAL #6   Title Timed Up-Go with LRAD <27 seconds safely to indicate lower fall risk. (Target Date: 08/03/2016)   Time 2   Period Months   Status New                Plan - 06/11/16 1409    Clinical Impression Statement Pt is making progress with prosthetic wear time and ambulation at home.  Initiated HEP at the sink to practice midline awareness and balance with wt shifting; pt required intermittent UE support and seated rest breaks.   Rehab Potential Good   PT Frequency 2x / week   PT Duration Other (comment)  9 weeks (60 days)   PT Treatment/Interventions ADLs/Self Care Home Management;DME Instruction;Gait training;Stair training;Functional mobility training;Therapeutic activities;Balance training;Neuromuscular re-education;Patient/family education;Prosthetic Training   PT Next Visit Plan review prosthetic care, HEP mid-line sink, gait with RW & prosthesis   Consulted and Agree with Plan of Care Patient      Patient will benefit from skilled therapeutic intervention in order to improve the following deficits and impairments:  Abnormal gait, Decreased activity tolerance, Decreased balance, Decreased endurance, Decreased knowledge of use of DME, Decreased mobility, Decreased strength, Postural dysfunction, Prosthetic Dependency  Visit Diagnosis: Other abnormalities of gait and mobility  Unsteadiness on feet  Muscle weakness (generalized)  Other symptoms and signs involving the musculoskeletal system     Problem List Patient Active Problem List   Diagnosis Date Noted  . Fluid retention   . Labile blood glucose   . Toe erythema   . Hypoglycemia associated with type 2 diabetes mellitus ([place])   . Hyponatremia   . Diarrhea 01/17/2016  . 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 AKI (acute kidney injury) ([place])   . Type 2 diabetes mellitus with peripheral neuropathy (H[facility])   . Status post aortic valve replacement   . Chronic combined systolic and diastolic  congestive heart failure ([place])   . ATN (acute tubular necrosis) ([place])   . Postoperative pain   . Acute blood loss anemia    . Leukocytosis   . Morbid obesity ([place])   . Status post below knee amputation of right lower extremity ([place])   . Insulin dependent diabetes mellitus ([place]) 01/09/2016  . Diabetic foot infection ([place]) 01/09/2016  . Sepsis, unspecified organism ([place]) 01/09/2016  . PAF (paroxysmal atrial fibrillation) ([place]) 04/22/2015  . Essential hypertension 04/22/2015  . Obesity 04/22/2015  . Dyspnea 04/20/2015  . Acute on chronic diastolic CHF (congestive heart failure) ([place]) 04/19/2015  . Cellulitis of leg 11/23/2014  . Diabetes ([place]) 09/13/2014  . Chronic anticoagulation 09/13/2014  . Atrial flutter, unspecified   . Paroxysmal atrial fibrillation ([facility])   . Perirectal abscess   . Other hemorrhoids   . Tenosynovitis of finger 05/05/2014  . Cellulitis and abscess of digit 05/05/2014  . Back pain   . H/O aortic valve replacement with tissue graft   . Hypertriglyceridemia   . Hypertension     Bjorn Loser, PTA  06/11/16, 2:20 PM [facility] [address] [place], [place], [address] Phone: [phone]   Fax:  [phone]  Name: Thomas Wright MRN: [id number] Date of Birth: 11-20-1945

## 2016-06-13 ENCOUNTER — Encounter: Payer: Self-pay | Admitting: Physical Therapy

## 2016-06-13 ENCOUNTER — Ambulatory Visit: Payer: Commercial Managed Care - HMO | Admitting: Physical Therapy

## 2016-06-13 DIAGNOSIS — R2689 Other abnormalities of gait and mobility: Secondary | ICD-10-CM | POA: Diagnosis not present

## 2016-06-13 DIAGNOSIS — M6281 Muscle weakness (generalized): Secondary | ICD-10-CM | POA: Diagnosis not present

## 2016-06-13 DIAGNOSIS — R2681 Unsteadiness on feet: Secondary | ICD-10-CM | POA: Diagnosis not present

## 2016-06-13 DIAGNOSIS — R29898 Other symptoms and signs involving the musculoskeletal system: Secondary | ICD-10-CM | POA: Diagnosis not present

## 2016-06-13 NOTE — Patient Instructions (Signed)
Secret Clinical Strength Antiperspirant

## 2016-06-13 NOTE — Therapy (Signed)
[facility] [address] [place], [place], [address] Phone: [phone]   Fax:  [phone]  Physical Therapy Treatment  Patient Details  Name: Thomas Wright MRN: [id number] Date of Birth: Dec 05, 1945 Referring Provider: [name], MD  Encounter Date: 06/13/2016      PT End of Session - 06/13/16 1527    Visit Number 3   Number of Visits 18   Date for PT Re-Evaluation 08/03/16   Authorization Type Medicare G-Code & Progress Note   PT Start Time 1100   PT Stop Time 1147   PT Time Calculation (min) 47 min   Equipment Utilized During Treatment Gait belt   Activity Tolerance Patient tolerated treatment well   Behavior During Therapy WFL for tasks assessed/performed      Past Medical History:  Diagnosis Date  . Arthritis    .  Marland Kitchen Cardiomyopathy ([place])    EF originally 20%-now 45%, no CAD on cath  . Chronic combined systolic and diastolic CHF (congestive heart failure) ([place])    a. 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 Reported h/o cath in 2008 without significant CAD.  Marland Kitchen Depression    "a little right now" (04/19/2015)  . Hyperlipidemia   . Hypertension   . Hypertriglyceridemia   . PAF (paroxysmal atrial fibrillation) ([place])    a. Postop 2008. b. recurrent afib 08/2014 with LAA clot noted on TEE. EF was down again at 25%. He was anticoagulated with Eliquis and placed on Amiodarone.He ultimately underwent TEE-DCCV 08/2014.  . S/P aortic valve replacement with bioprosthetic valve    a. bioprosthetic AVR in 05/2007 in [place].  . Type II diabetes mellitus ([place])     Past Surgical History:  Procedure Laterality Date  . AMPUTATION Right 07/29/2014   Procedure: AMPUTATION RIGHT LONG FINGER;  Surgeon: [name], MD;  Location: [facility];  Service: Orthopedics;  Laterality: Right;  . AMPUTATION Right 01/13/2016   Procedure: RIGHT BELOW KNEE  AMPUTATION;  Surgeon: Newt Minion, MD;  Location: [facility];  Service: Orthopedics;  Laterality: Right;  . AORTIC VALVE REPLACEMENT  05/2007   with tissue graft  . APPLICATION OF WOUND VAC Right 01/13/2016   Procedure: APPLICATION OF WOUND VAC;  Surgeon: Newt Minion, MD;  Location: [facility];  Service: Orthopedics;  Laterality: Right;  . CARDIAC CATHETERIZATION  "several"  . CARDIAC VALVE REPLACEMENT    . CARDIOVERSION N/A 07/23/2014   Procedure: CARDIOVERSION;  Surgeon: Josue Hector, MD;  Location: [facility];  Service: Cardiovascular;  Laterality: N/A;  . CARDIOVERSION N/A 09/01/2014   Procedure: CARDIOVERSION;  Surgeon: [name], MD;  Location: [facility] ENDOSCOPY;  Service: Cardiovascular;  Laterality: N/A;  . I&D EXTREMITY Right 05/05/2014   Procedure: IRRIGATION AND DEBRIDEMENT EXTREMITY;  Surgeon: [name], MD;  Location: [facility];  Service: Orthopedics;  Laterality: Right;  . TEE WITHOUT CARDIOVERSION N/A 07/23/2014   Procedure: TRANSESOPHAGEAL ECHOCARDIOGRAM (TEE);  Surgeon: Josue Hector, MD;  Location: [facility];  Service: Cardiovascular;  Laterality: N/A;  . TEE WITHOUT CARDIOVERSION N/A 09/01/2014   Procedure: TRANSESOPHAGEAL ECHOCARDIOGRAM (TEE);  Surgeon: [name], MD;  Location: [facility] ENDOSCOPY;  Service: Cardiovascular;  Laterality: N/A;  . TONSILLECTOMY  1954    There were no vitals filed for this visit.      Subjective Assessment - 06/13/16 1103    Subjective He is wearing prosthesis 5 hrs 2x/day since 9/22 without  issues.    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 No/denies                         [facility] Adult PT Treatment/Exercise - 06/13/16 1100      Transfers   Transfers Sit to Stand;Stand to Sit   Sit to Stand 5: Supervision;With upper extremity assist;With  armrests;From chair/3-in-1  to RW   Sit to Stand Details Verbal cues for precautions/safety     Ambulation/Gait   Ambulation/Gait Yes   Ambulation/Gait Assistance 5: Supervision   Ambulation/Gait Assistance Details demo & verbal cues on proper step width (not abducted), step length (step thru pattern) and upright posture.    Ambulation Distance (Feet) 300 Feet  300', 150' X 2   Assistive device Prosthesis;Rolling walker   Gait Pattern Step-through pattern;Decreased step length - left;Decreased stance time - right;Decreased stride length;Decreased hip/knee flexion - right;Decreased weight shift to right;Right hip hike;Left foot flat;Trunk flexed;Abducted- right;Poor foot clearance - left;Poor foot clearance - right   Ambulation Surface Indoor;Level   Stairs Yes   Stairs Assistance 5: Supervision   Stairs Assistance Details (indicate cue type and reason) demo & verbal cues on technique with prosthesis and how to perform with left rail similar to his house.    Stair Management Technique Two rails;Step to pattern;Forwards;One rail Left;Sideways  progressed to 2 hands on left rail   Number of Stairs 4  2 reps   Ramp 5: Supervision  prosthesis & RW   Ramp Details (indicate cue type and reason) demo & verbal cues on technique including posture & wt shift   Curb 5: Supervision  prosthesis & RW   Curb Details (indicate cue type and reason) demo & verbal cues on technique including sequence & step length     Self-Care   Self-Care Lifting   Lifting PT demo technique to pick up objects off floor with prosthesis. Chair behind for safety and RW support.      Prosthetics   Prosthetic Care Comments  PT instructed to initiate wear 6hrs 2x/day. Use of antiperspirant, Secret Clinical Strength on limb.  Use of sock under liner proximal to knee for sweat management. Fall risk when prosthesis off so intiaiting wear upon arising.    Current prosthetic wear tolerance (days/week)  daily   Current prosthetic  wear tolerance (#hours/day)  wearing 5hrs 2x/day.    Edema non-pitting   Residual limb condition  heat rash proximal to knee   Education Provided Skin check;Residual limb care;Correct ply sock adjustment;Prosthetic cleaning;Proper Donning;Proper wear schedule/adjustment;Other (comment)  see prosthetic care.   Person(s) Educated Patient   Education Method Explanation;Demonstration;Tactile cues;Verbal cues   Education Method Verbalized understanding;Returned demonstration;Tactile cues required;Verbal cues required;Needs further instruction                  PT Short Term Goals - 06/13/16 1528      PT SHORT TERM GOAL #1   Title Patient demonstrates proper donning & verbalizes proper cleaning of prosthesis. (Target Date: 07/05/2016)   Time 1   Period Months   Status On-going     PT SHORT TERM GOAL #2   Title Patient demonstrates understanding of initial HEP.  (Target Date: 07/05/2016)   Time 1   Period Months   Status On-going     PT SHORT TERM  GOAL #3   Title Patient tolerates wear of prosthesis >10 hrs total per day without skin issues or limb pain.  (Target Date: 07/05/2016)   Time 1   Period Months   Status On-going     PT SHORT TERM GOAL #4   Title Patient ambulates 200' with RW & prosthesis with supervision.  (Target Date: 07/05/2016)   Time 1   Period Months   Status On-going     PT SHORT TERM GOAL #5   Title Patient negotiates ramps, curbs with RW & stairs with 2 rails with supervision.  (Target Date: 07/05/2016)   Time 1   Period Months   Status On-going     PT SHORT TERM GOAL #6   Title Patient able to reach 10" and to floor with UE support on RW safely.  (Target Date: 07/05/2016)   Time 1   Period Months   Status On-going           PT Long Term Goals - 06/13/16 1528      PT LONG TERM GOAL #1   Title Patient tolerates wear of prosthesis all awake hours without skin issues or limb tenderness to enable function throughout his day. (Target Date:  08/03/2016)   Time 2   Period Months   Status On-going     PT LONG TERM GOAL #2   Title Patient verbalizes & demonstrates proper prosthetic care & use to enable safe use of prosthesis. (Target Date: 08/03/2016)   Time 2   Period Months   Status On-going     PT LONG TERM GOAL #3   Title Patient ambulates with LRAD & prosthesis 500' including outside surfaces modified independent to enable community mobility. (Target Date: 08/03/2016)   Time 2   Period Months   Status On-going     PT LONG TERM GOAL #4   Title Patient negotiates ramps, curbs & stairs with LRAD & prosthesis to enable community access. (Target Date: 08/03/2016)   Time 2   Period Months   Status On-going     PT LONG TERM GOAL #5   Title Berg Balance >36/56 to indicate lower fall risk. (Target Date: 08/03/2016)   Time 2   Period Months   Status On-going     PT LONG TERM GOAL #6   Title Timed Up-Go with LRAD <27 seconds safely to indicate lower fall risk. (Target Date: 08/03/2016)   Time 2   Period Months   Status On-going               Plan - 06/13/16 1528    Clinical Impression Statement Patient is tolerating increased wear with no tenderness and mild heat rash proximally. He verbalizes understanding of prosthetic care instructions. He lost balance trying to reach to floor and needed chair behind him.    Rehab Potential Good   PT Frequency 2x / week   PT Duration Other (comment)  9 weeks (60 days)   PT Treatment/Interventions ADLs/Self Care Home Management;DME Instruction;Gait training;Stair training;Functional mobility training;Therapeutic activities;Balance training;Neuromuscular re-education;Patient/family education;Prosthetic Training   PT Next Visit Plan review prosthetic care, HEP mid-line sink, gait with RW & prosthesis   Consulted and Agree with Plan of Care Patient      Patient will benefit from skilled therapeutic intervention in order to improve the following deficits and impairments:   Abnormal gait, Decreased activity tolerance, Decreased balance, Decreased endurance, Decreased knowledge of use of DME, Decreased mobility, Decreased strength, Postural dysfunction, Prosthetic Dependency  Visit Diagnosis: Other abnormalities  of gait and mobility  Unsteadiness on feet  Muscle weakness (generalized)  Other symptoms and signs involving the musculoskeletal system     Problem List Patient Active Problem List   Diagnosis Date Noted  . Fluid retention   . Labile blood glucose   . Toe erythema   . Hypoglycemia associated with type 2 diabetes mellitus ([place])   . Hyponatremia   . Diarrhea 01/17/2016  . 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 AKI (acute kidney injury) ([place])   . Type 2 diabetes mellitus with peripheral neuropathy ([facility])   . Status post aortic valve replacement   . Chronic combined systolic and diastolic congestive heart failure ([place])   . ATN (acute tubular necrosis) ([place])   . Postoperative pain   . Acute blood loss anemia   . Leukocytosis   . Morbid obesity ([place])   . Status post below knee amputation of right lower extremity ([place])   . Insulin dependent diabetes mellitus ([place]) 01/09/2016  . Diabetic foot infection ([place]) 01/09/2016  . Sepsis, unspecified organism ([place]) 01/09/2016  . PAF (paroxysmal atrial fibrillation) ([place]) 04/22/2015  . Essential hypertension 04/22/2015  . Obesity 04/22/2015  . Dyspnea 04/20/2015  . Acute on chronic diastolic CHF (congestive heart failure) ([place]) 04/19/2015  . Cellulitis of leg 11/23/2014  . Diabetes ([place]) 09/13/2014  . Chronic anticoagulation 09/13/2014  . Atrial flutter, unspecified   . Paroxysmal atrial fibrillation ([facility])   . Perirectal abscess   . Other hemorrhoids   . Tenosynovitis of finger 05/05/2014  . Cellulitis and abscess of digit 05/05/2014  . Back pain   . H/O aortic valve replacement with tissue graft   . Hypertriglyceridemia   . Hypertension      Sloan Takagi PT, DPT 06/13/2016, 3:31 PM  [facility] [address] [place], [place], [address] Phone: [phone]   Fax:  [phone]  Name: Thomas Wright MRN: [id number] Date of Birth: [birth year]

## 2016-06-18 ENCOUNTER — Ambulatory Visit: Payer: Commercial Managed Care - HMO | Attending: Orthopedic Surgery | Admitting: Physical Therapy

## 2016-06-18 ENCOUNTER — Encounter: Payer: Self-pay | Admitting: Physical Therapy

## 2016-06-18 DIAGNOSIS — M6281 Muscle weakness (generalized): Secondary | ICD-10-CM | POA: Insufficient documentation

## 2016-06-18 DIAGNOSIS — R2681 Unsteadiness on feet: Secondary | ICD-10-CM | POA: Insufficient documentation

## 2016-06-18 DIAGNOSIS — R2689 Other abnormalities of gait and mobility: Secondary | ICD-10-CM | POA: Diagnosis not present

## 2016-06-18 DIAGNOSIS — R29898 Other symptoms and signs involving the musculoskeletal system: Secondary | ICD-10-CM | POA: Diagnosis not present

## 2016-06-18 NOTE — Therapy (Signed)
[facility] [address] [place], [place], [address] Phone: [phone]   Fax:  [phone]  Physical Therapy Treatment  Patient Details  Name: Thomas Wright MRN: [id number] Date of Birth: 10-16-1945 Referring Provider: [name], MD  Encounter Date: 06/18/2016      PT End of Session - 06/18/16 1450    Visit Number 4   Number of Visits 18   Date for PT Re-Evaluation 08/03/16   Authorization Type Medicare G-Code & Progress Note   PT Start Time 1448   PT Stop Time 1530   PT Time Calculation (min) 42 min   Equipment Utilized During Treatment Gait belt   Activity Tolerance Patient tolerated treatment well   Behavior During Therapy WFL for tasks assessed/performed      Past Medical History:  Diagnosis Date  . Arthritis    .  Marland Kitchen Cardiomyopathy ([facility])    EF originally 20%-now 45%, no CAD on cath  . Chronic combined systolic and diastolic CHF (congestive heart failure) ([place])    a. 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 Reported h/o cath in 2008 without significant CAD.  Marland Kitchen Depression    "a little right now" (04/19/2015)  . Hyperlipidemia   . Hypertension   . Hypertriglyceridemia   . PAF (paroxysmal atrial fibrillation) ([facility])    a. Postop 2008. b. recurrent afib 08/2014 with LAA clot noted on TEE. EF was down again at 25%. He was anticoagulated with Eliquis and placed on Amiodarone.He ultimately underwent TEE-DCCV 08/2014.  . S/P aortic valve replacement with bioprosthetic valve    a. bioprosthetic AVR in 05/2007 in [place].  . Type II diabetes mellitus ([facility])     Past Surgical History:  Procedure Laterality Date  . AMPUTATION Right 07/29/2014   Procedure: AMPUTATION RIGHT LONG FINGER;  Surgeon: [name], MD;  Location: [facility];  Service: Orthopedics;  Laterality: Right;  . AMPUTATION Right 01/13/2016   Procedure: RIGHT BELOW KNEE  AMPUTATION;  Surgeon: Newt Minion, MD;  Location: [facility];  Service: Orthopedics;  Laterality: Right;  . AORTIC VALVE REPLACEMENT  05/2007   with tissue graft  . APPLICATION OF WOUND VAC Right 01/13/2016   Procedure: APPLICATION OF WOUND VAC;  Surgeon: Newt Minion, MD;  Location: [facility];  Service: Orthopedics;  Laterality: Right;  . CARDIAC CATHETERIZATION  "several"  . CARDIAC VALVE REPLACEMENT    . CARDIOVERSION N/A 07/23/2014   Procedure: CARDIOVERSION;  Surgeon: Josue Hector, MD;  Location: [facility];  Service: Cardiovascular;  Laterality: N/A;  . CARDIOVERSION N/A 09/01/2014   Procedure: CARDIOVERSION;  Surgeon: [name], MD;  Location: [facility] ENDOSCOPY;  Service: Cardiovascular;  Laterality: N/A;  . I&D EXTREMITY Right 05/05/2014   Procedure: IRRIGATION AND DEBRIDEMENT EXTREMITY;  Surgeon: [name], MD;  Location: [facility];  Service: Orthopedics;  Laterality: Right;  . TEE WITHOUT CARDIOVERSION N/A 07/23/2014   Procedure: TRANSESOPHAGEAL ECHOCARDIOGRAM (TEE);  Surgeon: Josue Hector, MD;  Location: [facility];  Service: Cardiovascular;  Laterality: N/A;  . TEE WITHOUT CARDIOVERSION N/A 09/01/2014   Procedure: TRANSESOPHAGEAL ECHOCARDIOGRAM (TEE);  Surgeon: [name], MD;  Location: [facility] ENDOSCOPY;  Service: Cardiovascular;  Laterality: N/A;  . TONSILLECTOMY  1954    There were no vitals filed for this visit.      Subjective Assessment - 06/18/16 1449    Subjective No new complaints. No falls or pain to report.  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 No/denies            [facility] Adult PT Treatment/Exercise - 06/18/16 1450      Transfers   Transfers Sit to Stand;Stand to Sit   Sit to Stand 5: Supervision;With upper extremity assist;With  armrests;From chair/3-in-1   Sit to Stand Details Verbal cues for safe use of DME/AE;Verbal cues for precautions/safety   Sit to Stand Details (indicate cue type and reason) cues to scoot closer to edge of chair, for anterior weight shifitng and equal use of LE'S   Stand to Sit 5: Supervision;With upper extremity assist;With armrests;To chair/3-in-1;Uncontrolled descent   Stand to Sit Details (indicate cue type and reason) Verbal cues for precautions/safety;Verbal cues for safe use of DME/AE   Stand to Sit Details cues to reach back after fully aligning self with surface prior to sitting down     Ambulation/Gait   Ambulation/Gait Yes   Ambulation/Gait Assistance 5: Supervision   Ambulation/Gait Assistance Details cues on posture, walker position with gait and for increased step/stride length with gait. increased assitance for balance outdoors for balance with cues on posture, walker management on uneven pavement and                                Ambulation Distance (Feet) 450 Feet  x1, ~280 x1 outside/indoors combined   Assistive device Prosthesis;Rolling walker   Gait Pattern Step-through pattern;Decreased step length - left;Decreased stance time - right;Decreased weight shift to right;Trunk flexed;Decreased trunk rotation   Ambulation Surface Level;Unlevel;Indoor;Outdoor;Paved   Stairs Yes   Stairs Assistance 5: Supervision;4: Min guard   Stairs Assistance Details (indicate cue type and reason) ascended fwd using one rail with both hands on it. descended with one rail sideways leading down with left leg, cues on prosthetic foot placement to allow knee to bend easier with descending.   Stair Management Technique One rail Left;Step to pattern;Forwards;Sideways   Number of Stairs 4   Ramp 5: Supervision;Other (comment)  min guard assist   Ramp Details (indicate cue type and reason) x 2 reps with cues on walker position with gait, sequencing, step length and posture   Curb 5: Supervision;4:  Min assist  with RW/prosthesis   Curb Details (indicate cue type and reason) cues on technique for desceding with left leg 1st and for proper prosthetic foot placmement with this     High Level Balance   High Level Balance Activities Side stepping;Marching forwards;Backward walking   High Level Balance Comments in parallel bars: 3 laps each with min gaurd assist and light UE support on bars. cues on posture, weight shifitng and ex form/technique.     Prosthetics   Prosthetic Care Comments  barrier liner sock on too low on limb under liner. repositioned so it comes just above knee area with instructions to pt to this at home.    Current prosthetic wear tolerance (days/week)  daily   Current prosthetic wear tolerance (#hours/day)  wearing 2x a day, for total wear time of 8-9 hours   Residual limb condition  heat rash is clearing up   Education Provided Residual limb care;Correct ply sock adjustment;Proper wear schedule/adjustment;Proper weight-bearing schedule/adjustment   Person(s) Educated Patient;Other (comment)  friend  Education Method Explanation;Demonstration;Verbal cues   Education Method Verbalized understanding;Needs further instruction   Donning Prosthesis Supervision   Doffing Prosthesis Supervision            PT Short Term Goals - 06/13/16 1528      PT SHORT TERM GOAL #1   Title Patient demonstrates proper donning & verbalizes proper cleaning of prosthesis. (Target Date: 07/05/2016)   Time 1   Period Months   Status On-going     PT SHORT TERM GOAL #2   Title Patient demonstrates understanding of initial HEP.  (Target Date: 07/05/2016)   Time 1   Period Months   Status On-going     PT SHORT TERM GOAL #3   Title Patient tolerates wear of prosthesis >10 hrs total per day without skin issues or limb pain.  (Target Date: 07/05/2016)   Time 1   Period Months   Status On-going     PT SHORT TERM GOAL #4   Title Patient ambulates 200' with RW & prosthesis with  supervision.  (Target Date: 07/05/2016)   Time 1   Period Months   Status On-going     PT SHORT TERM GOAL #5   Title Patient negotiates ramps, curbs with RW & stairs with 2 rails with supervision.  (Target Date: 07/05/2016)   Time 1   Period Months   Status On-going     PT SHORT TERM GOAL #6   Title Patient able to reach 10" and to floor with UE support on RW safely.  (Target Date: 07/05/2016)   Time 1   Period Months   Status On-going           PT Long Term Goals - 06/13/16 1528      PT LONG TERM GOAL #1   Title Patient tolerates wear of prosthesis all awake hours without skin issues or limb tenderness to enable function throughout his day. (Target Date: 08/03/2016)   Time 2   Period Months   Status On-going     PT LONG TERM GOAL #2   Title Patient verbalizes & demonstrates proper prosthetic care & use to enable safe use of prosthesis. (Target Date: 08/03/2016)   Time 2   Period Months   Status On-going     PT LONG TERM GOAL #3   Title Patient ambulates with LRAD & prosthesis 500' including outside surfaces modified independent to enable community mobility. (Target Date: 08/03/2016)   Time 2   Period Months   Status On-going     PT LONG TERM GOAL #4   Title Patient negotiates ramps, curbs & stairs with LRAD & prosthesis to enable community access. (Target Date: 08/03/2016)   Time 2   Period Months   Status On-going     PT LONG TERM GOAL #5   Title Berg Balance >36/56 to indicate lower fall risk. (Target Date: 08/03/2016)   Time 2   Period Months   Status On-going     PT LONG TERM GOAL #6   Title Timed Up-Go with LRAD <27 seconds safely to indicate lower fall risk. (Target Date: 08/03/2016)   Time 2   Period Months   Status On-going            Plan - 06/18/16 1450    Clinical Impression Statement Today's session continued to address gait and balance activities with RW. Pt continues to demo excessive UE weight bearing on RW/parallel bars with all  standing activity/weight bearing/gait activities. Pt is making steady progress toward goals and  should benefit from continued PT to progress toward unmet goals.                                        Rehab Potential Good   PT Frequency 2x / week   PT Duration Other (comment)  9 weeks (60 days)   PT Treatment/Interventions ADLs/Self Care Home Management;DME Instruction;Gait training;Stair training;Functional mobility training;Therapeutic activities;Balance training;Neuromuscular re-education;Patient/family education;Prosthetic Training   PT Next Visit Plan review prosthetic care, gait with RW & prosthesis, balance activities with emphasis on decreased UE reliance   Consulted and Agree with Plan of Care Patient      Patient will benefit from skilled therapeutic intervention in order to improve the following deficits and impairments:  Abnormal gait, Decreased activity tolerance, Decreased balance, Decreased endurance, Decreased knowledge of use of DME, Decreased mobility, Decreased strength, Postural dysfunction, Prosthetic Dependency  Visit Diagnosis: Other abnormalities of gait and mobility  Unsteadiness on feet  Muscle weakness (generalized)  Other symptoms and signs involving the musculoskeletal system     Problem List Patient Active Problem List   Diagnosis Date Noted  . Fluid retention   . Labile blood glucose   . Toe erythema   . Hypoglycemia associated with type 2 diabetes mellitus ([facility])   . Hyponatremia   . Diarrhea 01/17/2016  . 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 AKI (acute kidney injury) ([facility])   . Type 2 diabetes mellitus with peripheral neuropathy ([facility])   . Status post aortic valve replacement   . Chronic combined systolic and diastolic congestive heart failure ([place])   . ATN (acute tubular necrosis) ([facility])   . Postoperative pain   . Acute blood loss anemia   . Leukocytosis   . Morbid obesity ([place])   . Status post  below knee amputation of right lower extremity ([place])   . Insulin dependent diabetes mellitus ([place]) 01/09/2016  . Diabetic foot infection ([place]) 01/09/2016  . Sepsis, unspecified organism ([place]) 01/09/2016  . PAF (paroxysmal atrial fibrillation) ([place]) 04/22/2015  . Essential hypertension 04/22/2015  . Obesity 04/22/2015  . Dyspnea 04/20/2015  . Acute on chronic diastolic CHF (congestive heart failure) ([place]) 04/19/2015  . Cellulitis of leg 11/23/2014  . Diabetes ([facility]) 09/13/2014  . Chronic anticoagulation 09/13/2014  . Atrial flutter, unspecified   . Paroxysmal atrial fibrillation ([facility])   . Perirectal abscess   . Other hemorrhoids   . Tenosynovitis of finger 05/05/2014  . Cellulitis and abscess of digit 05/05/2014  . Back pain   . H/O aortic valve replacement with tissue graft   . Hypertriglyceridemia   . Hypertension     [facility], [place], [place] [address], [place], [place] [address] [phone] 06/18/16, 11:19 PM   Name: REDRICK NORDAHL MRN: [id number] Date of Birth: [birth year]

## 2016-06-20 ENCOUNTER — Encounter: Payer: Self-pay | Admitting: Physical Therapy

## 2016-06-20 ENCOUNTER — Ambulatory Visit: Payer: Commercial Managed Care - HMO | Admitting: Physical Therapy

## 2016-06-20 DIAGNOSIS — M6281 Muscle weakness (generalized): Secondary | ICD-10-CM | POA: Diagnosis not present

## 2016-06-20 DIAGNOSIS — R2689 Other abnormalities of gait and mobility: Secondary | ICD-10-CM

## 2016-06-20 DIAGNOSIS — R2681 Unsteadiness on feet: Secondary | ICD-10-CM | POA: Diagnosis not present

## 2016-06-20 DIAGNOSIS — R29898 Other symptoms and signs involving the musculoskeletal system: Secondary | ICD-10-CM

## 2016-06-24 NOTE — Therapy (Signed)
[facility] [address] [place], [place], [address] Phone: [phone]   Fax:  [phone]  Physical Therapy Treatment  Patient Details  Name: Thomas Wright MRN: [id number] Date of Birth: [birth year] Referring Provider: [name], MD  Encounter Date: 06/20/2016     06/20/16 1445  PT Visits / Re-Eval  Visit Number 5  Number of Visits 18  Date for PT Re-Evaluation 08/03/16  Authorization  Authorization Type Medicare G-Code & Progress Note  PT Time Calculation  PT Start Time 1400  PT Stop Time 1445  PT Time Calculation (min) 45 min  PT - End of Session  Equipment Utilized During Treatment Gait belt  Activity Tolerance Patient tolerated treatment well  Behavior During Therapy [facility] for tasks assessed/performed    Past Medical History:  Diagnosis Date  . Arthritis    .  Marland Kitchen Cardiomyopathy ([place])    EF originally 20%-now 45%, no CAD on cath  . Chronic combined systolic and diastolic CHF (congestive heart failure) ([place])    a. 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 Reported h/o cath in 2008 without significant CAD.  Marland Kitchen Depression    "a little right now" (04/19/2015)  . Hyperlipidemia   . Hypertension   . Hypertriglyceridemia   . PAF (paroxysmal atrial fibrillation) ([place])    a. Postop 2008. b. recurrent afib 08/2014 with LAA clot noted on TEE. EF was down again at 25%. He was anticoagulated with Eliquis and placed on Amiodarone.He ultimately underwent TEE-DCCV 08/2014.  . S/P aortic valve replacement with bioprosthetic valve    a. bioprosthetic AVR in 05/2007 in [place].  . Type II diabetes mellitus ([place])     Past Surgical History:  Procedure Laterality Date  . AMPUTATION Right 07/29/2014   Procedure: AMPUTATION RIGHT LONG FINGER;  Surgeon: [name], MD;  Location: [place] [facility];  Service: Orthopedics;  Laterality: Right;  . AMPUTATION Right  01/13/2016   Procedure: RIGHT BELOW KNEE AMPUTATION;  Surgeon: Newt Minion, MD;  Location: [facility];  Service: Orthopedics;  Laterality: Right;  . AORTIC VALVE REPLACEMENT  05/2007   with tissue graft  . APPLICATION OF WOUND VAC Right 01/13/2016   Procedure: APPLICATION OF WOUND VAC;  Surgeon: Newt Minion, MD;  Location: [employer];  Service: Orthopedics;  Laterality: Right;  . CARDIAC CATHETERIZATION  "several"  . CARDIAC VALVE REPLACEMENT    . CARDIOVERSION N/A 07/23/2014   Procedure: CARDIOVERSION;  Surgeon: Josue Hector, MD;  Location: [facility];  Service: Cardiovascular;  Laterality: N/A;  . CARDIOVERSION N/A 09/01/2014   Procedure: CARDIOVERSION;  Surgeon: [name], MD;  Location: [facility] ENDOSCOPY;  Service: Cardiovascular;  Laterality: N/A;  . I&D EXTREMITY Right 05/05/2014   Procedure: IRRIGATION AND DEBRIDEMENT EXTREMITY;  Surgeon: [name], MD;  Location: [facility];  Service: Orthopedics;  Laterality: Right;  . TEE WITHOUT CARDIOVERSION N/A 07/23/2014   Procedure: TRANSESOPHAGEAL ECHOCARDIOGRAM (TEE);  Surgeon: Josue Hector, MD;  Location: [place] [facility];  Service: Cardiovascular;  Laterality: N/A;  . TEE WITHOUT CARDIOVERSION N/A 09/01/2014   Procedure: TRANSESOPHAGEAL ECHOCARDIOGRAM (TEE);  Surgeon: [name], MD;  Location[facility];  Service: Cardiovascular;  Laterality: N/A;  . TONSILLECTOMY  1954    There were no vitals filed for this visit.      06/20/16 1400  Transfers  Transfers Sit to Stand;Stand to Sit  Sit to Stand 5: Supervision;With upper extremity assist;With armrests;From  chair/3-in-1  Sit to Stand Details Verbal cues for safe use of DME/AE;Verbal cues for precautions/safety  Sit to Stand Details (indicate cue type and reason) cues on technique without armrests on chair  Stand to Sit 5: Supervision;With upper extremity assist;With armrests;To chair/3-in-1;Uncontrolled descent  Stand to Sit Details (indicate cue type and reason) Verbal cues for  precautions/safety;Verbal cues for safe use of DME/AE  Stand to Sit Details cues on technique without armrests on chair  Ambulation/Gait  Ambulation/Gait Yes  Ambulation/Gait Assistance 5: Supervision  Ambulation/Gait Assistance Details cues on step thru pattern, proper step width and posture.  Ambulation Distance (Feet) 450 Feet (450' X 2)  Assistive device Prosthesis;Rolling walker  Gait Pattern Step-through pattern;Decreased step length - left;Decreased stance time - right;Decreased weight shift to right;Trunk flexed;Decreased trunk rotation  Ambulation Surface Indoor;Level  Stairs Yes  Stairs Assistance 5: Supervision;4: Min guard  Stairs Assistance Details (indicate cue type and reason) cues on weight shift  Stair Management Technique One rail Left;Step to pattern;Forwards;Sideways  Number of Stairs 4  Ramp 5: Supervision;Other (comment) (min guard assist)  Ramp Details (indicate cue type and reason) cues on proper technique  Curb 5: Supervision;4: Min assist (with RW/prosthesis)  Curb Details (indicate cue type and reason) cues on proper technique  High Level Balance  High Level Balance Activities Side stepping;Marching forwards;Backward walking  High Level Balance Comments cues on weight shift  Prosthetics  Prosthetic Care Comments  Fall risk without prosthesis, cues on managing sweat including use of antisperant  Current prosthetic wear tolerance (days/week)  daily  Current prosthetic wear tolerance (#hours/day)  PT instructed to increase wear to 4-5 hrss 2x/day (initiate wear upon arising & remove with preparing for bed)  Residual limb condition  no heat rash noted  Education Provided Residual limb care;Correct ply sock adjustment;Proper wear schedule/adjustment;Proper weight-bearing schedule/adjustment  Person(s) Educated Patient  Education Method Explanation;Demonstration;Tactile cues;Verbal cues  Education Method Verbalized understanding;Returned demonstration;Tactile  cues required;Verbal cues required;Needs further instruction                                PT Short Term Goals - 06/13/16 1528      PT SHORT TERM GOAL #1   Title Patient demonstrates proper donning & verbalizes proper cleaning of prosthesis. (Target Date: 07/05/2016)   Time 1   Period Months   Status On-going     PT SHORT TERM GOAL #2   Title Patient demonstrates understanding of initial HEP.  (Target Date: 07/05/2016)   Time 1   Period Months   Status On-going     PT SHORT TERM GOAL #3   Title Patient tolerates wear of prosthesis >10 hrs total per day without skin issues or limb pain.  (Target Date: 07/05/2016)   Time 1   Period Months   Status On-going     PT SHORT TERM GOAL #4   Title Patient ambulates 200' with RW & prosthesis with supervision.  (Target Date: 07/05/2016)   Time 1   Period Months   Status On-going     PT SHORT TERM GOAL #5   Title Patient negotiates ramps, curbs with RW & stairs with 2 rails with supervision.  (Target Date: 07/05/2016)   Time 1   Period Months   Status On-going     PT SHORT TERM GOAL #6   Title Patient able to reach 10" and to floor with UE support on RW safely.  (Target Date: 07/05/2016)  Time 1   Period Months   Status On-going           PT Long Term Goals - 06/13/16 1528      PT LONG TERM GOAL #1   Title Patient tolerates wear of prosthesis all awake hours without skin issues or limb tenderness to enable function throughout his day. (Target Date: 08/03/2016)   Time 2   Period Months   Status On-going     PT LONG TERM GOAL #2   Title Patient verbalizes & demonstrates proper prosthetic care & use to enable safe use of prosthesis. (Target Date: 08/03/2016)   Time 2   Period Months   Status On-going     PT LONG TERM GOAL #3   Title Patient ambulates with LRAD & prosthesis 500' including outside surfaces modified independent to enable community mobility. (Target Date: 08/03/2016)   Time 2    Period Months   Status On-going     PT LONG TERM GOAL #4   Title Patient negotiates ramps, curbs & stairs with LRAD & prosthesis to enable community access. (Target Date: 08/03/2016)   Time 2   Period Months   Status On-going     PT LONG TERM GOAL #5   Title Berg Balance >36/56 to indicate lower fall risk. (Target Date: 08/03/2016)   Time 2   Period Months   Status On-going     PT LONG TERM GOAL #6   Title Timed Up-Go with LRAD <27 seconds safely to indicate lower fall risk. (Target Date: 08/03/2016)   Time 2   Period Months   Status On-going         06/20/16 1445  Plan  Clinical Impression Statement Patient has improved gait with prosthesis and is on target to meet STGs. He is beginning to use prosthesis for community access with rolling walker.   Pt will benefit from skilled therapeutic intervention in order to improve on the following deficits Abnormal gait;Decreased activity tolerance;Decreased balance;Decreased endurance;Decreased knowledge of use of DME;Decreased mobility;Decreased strength;Postural dysfunction;Prosthetic Dependency  Rehab Potential Good  PT Frequency 2x / week  PT Duration Other (comment) (9 weeks (60 days))  PT Treatment/Interventions ADLs/Self Care Home Management;DME Instruction;Gait training;Stair training;Functional mobility training;Therapeutic activities;Balance training;Neuromuscular re-education;Patient/family education;Prosthetic Training  PT Next Visit Plan review prosthetic care, gait with RW & prosthesis, balance activities with emphasis on decreased UE reliance  Consulted and Agree with Plan of Care Patient         Patient will benefit from skilled therapeutic intervention in order to improve the following deficits and impairments:     Visit Diagnosis: Other abnormalities of gait and mobility  Unsteadiness on feet  Muscle weakness (generalized)  Other symptoms and signs involving the musculoskeletal system     Problem  List Patient Active Problem List   Diagnosis Date Noted  . Fluid retention   . Labile blood glucose   . Toe erythema   . Hypoglycemia associated with type 2 diabetes mellitus ([place])   . Hyponatremia   . Diarrhea 01/17/2016  . 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 AKI (acute kidney injury) ([place])   . Type 2 diabetes mellitus with peripheral neuropathy (HCC)   . Status post aortic valve replacement   . Chronic combined systolic and diastolic congestive heart failure ([place])   . ATN (acute tubular necrosis) ([place])   . Postoperative pain   . Acute blood loss anemia   . Leukocytosis   . Morbid obesity ([place])   .  Status post below knee amputation of right lower extremity ([place])   . Insulin dependent diabetes mellitus ([place]) 01/09/2016  . Diabetic foot infection ([place]) 01/09/2016  . Sepsis, unspecified organism ([place]) 01/09/2016  . PAF (paroxysmal atrial fibrillation) ([place]) 04/22/2015  . Essential hypertension 04/22/2015  . Obesity 04/22/2015  . Dyspnea 04/20/2015  . Acute on chronic diastolic CHF (congestive heart failure) ([place]) 04/19/2015  . Cellulitis of leg 11/23/2014  . Diabetes ([place]) 09/13/2014  . Chronic anticoagulation 09/13/2014  . Atrial flutter, unspecified   . Paroxysmal atrial fibrillation (H[facility])   . Perirectal abscess   . Other hemorrhoids   . Tenosynovitis of finger 05/05/2014  . Cellulitis and abscess of digit 05/05/2014  . Back pain   . H/O aortic valve replacement with tissue graft   . Hypertriglyceridemia   . Hypertension     Tanelle Lanzo 06/24/2016, 10:50 PM  [facility] [address] [place], [place], [address] Phone: [phone]   Fax:  [phone]  Name: Thomas Wright MRN: [id number] Date of Birth: 16-Feb-1946

## 2016-06-25 ENCOUNTER — Ambulatory Visit: Payer: Commercial Managed Care - HMO | Admitting: Physical Therapy

## 2016-06-25 DIAGNOSIS — R29898 Other symptoms and signs involving the musculoskeletal system: Secondary | ICD-10-CM | POA: Diagnosis not present

## 2016-06-25 DIAGNOSIS — R2681 Unsteadiness on feet: Secondary | ICD-10-CM | POA: Diagnosis not present

## 2016-06-25 DIAGNOSIS — R2689 Other abnormalities of gait and mobility: Secondary | ICD-10-CM

## 2016-06-25 DIAGNOSIS — M6281 Muscle weakness (generalized): Secondary | ICD-10-CM

## 2016-06-25 NOTE — Therapy (Signed)
[facility] [address] [place], [place], [address] Phone: [phone]   Fax:  [phone]  Physical Therapy Treatment  Patient Details  Name: Thomas Wright MRN: [id number] Date of Birth: [birth year] Referring Provider: [name], MD  Encounter Date: 06/25/2016      PT End of Session - 06/25/16 1316    Visit Number 6   Number of Visits 18   Date for PT Re-Evaluation 08/03/16   Authorization Type Medicare G-Code & Progress Note   PT Start Time 1230   PT Stop Time 1316   PT Time Calculation (min) 46 min   Equipment Utilized During Treatment Gait belt   Activity Tolerance Patient tolerated treatment well   Behavior During Therapy WFL for tasks assessed/performed      Past Medical History:  Diagnosis Date  . Arthritis    .  Marland Kitchen Cardiomyopathy ([place])    EF originally 20%-now 45%, no CAD on cath  . Chronic combined systolic and diastolic CHF (congestive heart failure) ([place])    a. 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 Reported h/o cath in 2008 without significant CAD.  Marland Kitchen Depression    "a little right now" (04/19/2015)  . Hyperlipidemia   . Hypertension   . Hypertriglyceridemia   . PAF (paroxysmal atrial fibrillation) ([place])    a. Postop 2008. b. recurrent afib 08/2014 with LAA clot noted on TEE. EF was down again at 25%. He was anticoagulated with Eliquis and placed on Amiodarone.He ultimately underwent TEE-DCCV 08/2014.  . S/P aortic valve replacement with bioprosthetic valve    a. bioprosthetic AVR in 05/2007 in [place].  . Type II diabetes mellitus ([place])     Past Surgical History:  Procedure Laterality Date  . AMPUTATION Right 07/29/2014   Procedure: AMPUTATION RIGHT LONG FINGER;  Surgeon: [name], MD;  Location: [facility];  Service: Orthopedics;  Laterality: Right;  . AMPUTATION Right 01/13/2016   Procedure: RIGHT BELOW KNEE  AMPUTATION;  Surgeon: Newt Minion, MD;  Location: [facility];  Service: Orthopedics;  Laterality: Right;  . AORTIC VALVE REPLACEMENT  05/2007   with tissue graft  . APPLICATION OF WOUND VAC Right 01/13/2016   Procedure: APPLICATION OF WOUND VAC;  Surgeon: Newt Minion, MD;  Location: [facility];  Service: Orthopedics;  Laterality: Right;  . CARDIAC CATHETERIZATION  "several"  . CARDIAC VALVE REPLACEMENT    . CARDIOVERSION N/A 07/23/2014   Procedure: CARDIOVERSION;  Surgeon: Josue Hector, MD;  Location: [place] [facility];  Service: Cardiovascular;  Laterality: N/A;  . CARDIOVERSION N/A 09/01/2014   Procedure: CARDIOVERSION;  Surgeon: [name], MD;  Location: [facility] ENDOSCOPY;  Service: Cardiovascular;  Laterality: N/A;  . I&D EXTREMITY Right 05/05/2014   Procedure: IRRIGATION AND DEBRIDEMENT EXTREMITY;  Surgeon: [name], MD;  Location: [facility];  Service: Orthopedics;  Laterality: Right;  . TEE WITHOUT CARDIOVERSION N/A 07/23/2014   Procedure: TRANSESOPHAGEAL ECHOCARDIOGRAM (TEE);  Surgeon: Josue Hector, MD;  Location: [facility];  Service: Cardiovascular;  Laterality: N/A;  . TEE WITHOUT CARDIOVERSION N/A 09/01/2014   Procedure: TRANSESOPHAGEAL ECHOCARDIOGRAM (TEE);  Surgeon: [name], MD;  Location: [facility] ENDOSCOPY;  Service: Cardiovascular;  Laterality: N/A;  . TONSILLECTOMY  1954    There were no vitals filed for this visit.      Subjective Assessment - 06/25/16 1233    Subjective Pt walked across the kitchen without AD.   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 Yes   Pain Score 8    Pain Location Shoulder   Pain Orientation Right   Pain Descriptors / Indicators Aching   Pain Type Chronic pain   Pain Onset More than a month ago   Pain Frequency Constant   Aggravating  Factors  movement   Pain Relieving Factors avoid certain movement                         [facility] Adult PT Treatment/Exercise - 06/25/16 0001      Transfers   Transfers Sit to Stand;Stand to Sit   Sit to Stand 5: Supervision;With upper extremity assist;With armrests;From chair/3-in-1   Sit to Stand Details Tactile cues for weight shifting;Tactile cues for posture   Stand to Sit 5: Supervision;With upper extremity assist;With armrests;To chair/3-in-1;Uncontrolled descent     Ambulation/Gait   Ambulation/Gait Yes   Ambulation/Gait Assistance 4: Min guard;4: Min assist  Min A- gait without AD   Ambulation/Gait Assistance Details cues for posture, step length, and sequence with SPC   Ambulation Distance (Feet) 115 Feet  x2 + 25x2 without AD   Assistive device Prosthesis;Rolling walker;None   Gait Pattern Step-through pattern;Decreased step length - left;Decreased stance time - right;Decreased weight shift to right;Trunk flexed;Decreased trunk rotation   Ambulation Surface Level;Indoor   Stairs Yes   Stairs Assistance 4: Min guard   Stairs Assistance Details (indicate cue type and reason) cues for sequence   Stair Management Technique One rail Right;With cane;Forwards;Step to pattern   Number of Stairs 4  x2   Ramp 4: Min assist   Ramp Details (indicate cue type and reason) cues for safe technique with [facility]     Prosthetics   Prosthetic Care Comments  Wearing 12hrs/day;  drying every 4 hrs. using sweat band and antiperserent   Current prosthetic wear tolerance (days/week)  daily   Residual limb condition  no heat rash noted   Education Provided Residual limb care;Correct ply sock adjustment;Proper wear schedule/adjustment;Proper weight-bearing schedule/adjustment   Person(s) Educated Patient   Education Method Explanation;Demonstration;Tactile cues;Verbal cues   Education Method Verbalized understanding   Donning Prosthesis Supervision   Doffing Prosthesis Modified  independent (device/increased time)                PT Education - 06/25/16 1621    Education provided Yes   Education Details Trialled quad and tri -tips for SPC, Pt liked and performed gait well with both.  Instructed pt on the level he currently is at for gait (min guard with cane) and cautioned pt on safety risk when walking without AD at this time.   Person(s) Educated Patient;Spouse   Methods Explanation;Demonstration   Comprehension Verbalized understanding;Returned demonstration          PT Short Term Goals - 06/13/16 1528      PT SHORT TERM GOAL #1   Title Patient demonstrates proper donning & verbalizes proper cleaning of prosthesis. (Target Date: 07/05/2016)   Time 1   Period Months   Status On-going     PT SHORT TERM GOAL #2   Title Patient demonstrates understanding of initial HEP.  (Target Date: 07/05/2016)   Time 1   Period Months   Status On-going     PT  SHORT TERM GOAL #3   Title Patient tolerates wear of prosthesis >10 hrs total per day without skin issues or limb pain.  (Target Date: 07/05/2016)   Time 1   Period Months   Status On-going     PT SHORT TERM GOAL #4   Title Patient ambulates 200' with RW & prosthesis with supervision.  (Target Date: 07/05/2016)   Time 1   Period Months   Status On-going     PT SHORT TERM GOAL #5   Title Patient negotiates ramps, curbs with RW & stairs with 2 rails with supervision.  (Target Date: 07/05/2016)   Time 1   Period Months   Status On-going     PT SHORT TERM GOAL #6   Title Patient able to reach 10" and to floor with UE support on RW safely.  (Target Date: 07/05/2016)   Time 1   Period Months   Status On-going           PT Long Term Goals - 06/13/16 1528      PT LONG TERM GOAL #1   Title Patient tolerates wear of prosthesis all awake hours without skin issues or limb tenderness to enable function throughout his day. (Target Date: 08/03/2016)   Time 2   Period Months   Status On-going      PT LONG TERM GOAL #2   Title Patient verbalizes & demonstrates proper prosthetic care & use to enable safe use of prosthesis. (Target Date: 08/03/2016)   Time 2   Period Months   Status On-going     PT LONG TERM GOAL #3   Title Patient ambulates with LRAD & prosthesis 500' including outside surfaces modified independent to enable community mobility. (Target Date: 08/03/2016)   Time 2   Period Months   Status On-going     PT LONG TERM GOAL #4   Title Patient negotiates ramps, curbs & stairs with LRAD & prosthesis to enable community access. (Target Date: 08/03/2016)   Time 2   Period Months   Status On-going     PT LONG TERM GOAL #5   Title Berg Balance >36/56 to indicate lower fall risk. (Target Date: 08/03/2016)   Time 2   Period Months   Status On-going     PT LONG TERM GOAL #6   Title Timed Up-Go with LRAD <27 seconds safely to indicate lower fall risk. (Target Date: 08/03/2016)   Time 2   Period Months   Status On-going               Plan - 06/25/16 1623    Clinical Impression Statement Progressed prosthetic training with application of ply sock adjustment; pt seemed to perform gait well with 4 ply sock.  Progress gait training, using SPC for community barriers; requiring min guard to min A.   Rehab Potential Good   PT Frequency 2x / week   PT Duration Other (comment)  9 weeks (60 days)   PT Treatment/Interventions ADLs/Self Care Home Management;DME Instruction;Gait training;Stair training;Functional mobility training;Therapeutic activities;Balance training;Neuromuscular re-education;Patient/family education;Prosthetic Training   PT Next Visit Plan review prosthetic care, gait with RW & prosthesis, balance activities with emphasis on decreased UE reliance   Consulted and Agree with Plan of Care Patient      Patient will benefit from skilled therapeutic intervention in order to improve the following deficits and impairments:  Abnormal gait, Decreased activity  tolerance, Decreased balance, Decreased endurance, Decreased knowledge of use of DME, Decreased mobility, Decreased strength, Postural dysfunction,  Prosthetic Dependency  Visit Diagnosis: Other abnormalities of gait and mobility  Unsteadiness on feet  Muscle weakness (generalized)  Other symptoms and signs involving the musculoskeletal system     Problem List Patient Active Problem List   Diagnosis Date Noted  . Fluid retention   . Labile blood glucose   . Toe erythema   . Hypoglycemia associated with type 2 diabetes mellitus ([place])   . Hyponatremia   . Diarrhea 01/17/2016  . Amputation of right lower extremity below knee with complication ([place]) 123456  . AKI (acute kidney injury) ([place])   . Type 2 diabetes mellitus with peripheral neuropathy (H[facility])   . Status post aortic valve replacement   . Chronic combined systolic and diastolic congestive heart failure ([place])   . ATN (acute tubular necrosis) ([place])   . Postoperative pain   . Acute blood loss anemia   . Leukocytosis   . Morbid obesity ([place])   . Status post below knee amputation of right lower extremity ([place])   . Insulin dependent diabetes mellitus ([place]) 01/09/2016  . Diabetic foot infection ([place]) 01/09/2016  . Sepsis, unspecified organism ([place]) 01/09/2016  . PAF (paroxysmal atrial fibrillation) ([place]) 04/22/2015  . Essential hypertension 04/22/2015  . Obesity 04/22/2015  . Dyspnea 04/20/2015  . Acute on chronic diastolic CHF (congestive heart failure) ([place]) 04/19/2015  . Cellulitis of leg 11/23/2014  . Diabetes ([place]) 09/13/2014  . Chronic anticoagulation 09/13/2014  . Atrial flutter, unspecified   . Paroxysmal atrial fibrillation (H[facility])   . Perirectal abscess   . Other hemorrhoids   . Tenosynovitis of finger 05/05/2014  . Cellulitis and abscess of digit 05/05/2014  . Back pain   . H/O aortic valve replacement with tissue graft   . Hypertriglyceridemia   . Hypertension     Bjorn Loser, PTA  06/25/16, 4:28  PM [facility] [address] [place], [place], [address] Phone: [phone]   Fax:  [phone]  Name: TYAIR LOHMEYER MRN: [id number] Date of Birth: 09-19-1945

## 2016-06-27 ENCOUNTER — Ambulatory Visit: Payer: Commercial Managed Care - HMO | Admitting: Physical Therapy

## 2016-07-01 DIAGNOSIS — R2689 Other abnormalities of gait and mobility: Secondary | ICD-10-CM | POA: Diagnosis present

## 2016-07-01 DIAGNOSIS — R29898 Other symptoms and signs involving the musculoskeletal system: Secondary | ICD-10-CM | POA: Diagnosis present

## 2016-07-01 DIAGNOSIS — R2681 Unsteadiness on feet: Secondary | ICD-10-CM | POA: Diagnosis present

## 2016-07-01 DIAGNOSIS — M6281 Muscle weakness (generalized): Secondary | ICD-10-CM | POA: Diagnosis present

## 2016-07-02 ENCOUNTER — Encounter: Payer: Self-pay | Admitting: Physical Therapy

## 2016-07-02 ENCOUNTER — Ambulatory Visit: Payer: Commercial Managed Care - HMO | Admitting: Physical Therapy

## 2016-07-02 DIAGNOSIS — M6281 Muscle weakness (generalized): Secondary | ICD-10-CM | POA: Diagnosis not present

## 2016-07-02 DIAGNOSIS — R2689 Other abnormalities of gait and mobility: Secondary | ICD-10-CM | POA: Diagnosis not present

## 2016-07-02 DIAGNOSIS — R29898 Other symptoms and signs involving the musculoskeletal system: Secondary | ICD-10-CM | POA: Diagnosis not present

## 2016-07-02 DIAGNOSIS — R2681 Unsteadiness on feet: Secondary | ICD-10-CM | POA: Diagnosis not present

## 2016-07-02 NOTE — Therapy (Signed)
[facility] [address] [place], [place], [address] Phone: [phone]   Fax:  [phone]  Physical Therapy Treatment  Patient Details  Name: Thomas Wright MRN: [id number] Date of Birth: 1946/06/09 Referring Provider: [name], MD  Encounter Date: 07/02/2016      PT End of Session - 07/02/16 1316    Visit Number 7   Number of Visits 18   Date for PT Re-Evaluation 08/03/16   Authorization Type Medicare G-Code & Progress Note   PT Start Time 1230   PT Stop Time 1315   PT Time Calculation (min) 45 min   Equipment Utilized During Treatment Gait belt   Activity Tolerance Patient tolerated treatment well   Behavior During Therapy [facility] for tasks assessed/performed      Past Medical History:  Diagnosis Date  . Arthritis    .  Marland Kitchen Cardiomyopathy ([place])    EF originally 20%-now 45%, no CAD on cath  . Chronic combined systolic and diastolic CHF (congestive heart failure) ([place])    a. 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 Reported h/o cath in 2008 without significant CAD.  Marland Kitchen Depression    "a little right now" (04/19/2015)  . Hyperlipidemia   . Hypertension   . Hypertriglyceridemia   . PAF (paroxysmal atrial fibrillation) ([place])    a. Postop 2008. b. recurrent afib 08/2014 with LAA clot noted on TEE. EF was down again at 25%. He was anticoagulated with Eliquis and placed on Amiodarone.He ultimately underwent TEE-DCCV 08/2014.  . S/P aortic valve replacement with bioprosthetic valve    a. bioprosthetic AVR in 05/2007 in [place].  . Type II diabetes mellitus ([place])     Past Surgical History:  Procedure Laterality Date  . AMPUTATION Right 07/29/2014   Procedure: AMPUTATION RIGHT LONG FINGER;  Surgeon: [name], MD;  Location: [facility];  Service: Orthopedics;  Laterality: Right;  . AMPUTATION Right 01/13/2016   Procedure: RIGHT BELOW KNEE  AMPUTATION;  Surgeon: Newt Minion, MD;  Location: [facility];  Service: Orthopedics;  Laterality: Right;  . AORTIC VALVE REPLACEMENT  05/2007   with tissue graft  . APPLICATION OF WOUND VAC Right 01/13/2016   Procedure: APPLICATION OF WOUND VAC;  Surgeon: Newt Minion, MD;  Location: [facility];  Service: Orthopedics;  Laterality: Right;  . CARDIAC CATHETERIZATION  "several"  . CARDIAC VALVE REPLACEMENT    . CARDIOVERSION N/A 07/23/2014   Procedure: CARDIOVERSION;  Surgeon: Josue Hector, MD;  Location: [facility];  Service: Cardiovascular;  Laterality: N/A;  . CARDIOVERSION N/A 09/01/2014   Procedure: CARDIOVERSION;  Surgeon: [name], MD;  Location: [facility] ENDOSCOPY;  Service: Cardiovascular;  Laterality: N/A;  . I&D EXTREMITY Right 05/05/2014   Procedure: IRRIGATION AND DEBRIDEMENT EXTREMITY;  Surgeon: [name], MD;  Location: [facility];  Service: Orthopedics;  Laterality: Right;  . TEE WITHOUT CARDIOVERSION N/A 07/23/2014   Procedure: TRANSESOPHAGEAL ECHOCARDIOGRAM (TEE);  Surgeon: Josue Hector, MD;  Location: [facility] [facility];  Service: Cardiovascular;  Laterality: N/A;  . TEE WITHOUT CARDIOVERSION N/A 09/01/2014   Procedure: TRANSESOPHAGEAL ECHOCARDIOGRAM (TEE);  Surgeon: [name], MD;  Location: [facility] ENDOSCOPY;  Service: Cardiovascular;  Laterality: N/A;  . TONSILLECTOMY  1954    There were no vitals filed for this visit.      Subjective Assessment - 07/02/16 1234    Subjective No falls; started using SPC this past Saturday. Fixed breakfast  standing with intermittent seated rest breaks.   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 No/denies   Pain Onset --                         [facility] Adult PT Treatment/Exercise - 07/02/16 0001      Ambulation/Gait    Ambulation/Gait Yes   Ambulation/Gait Assistance 5: Supervision   Ambulation/Gait Assistance Details Good visual scanning, step length, foot clearance   Ambulation Distance (Feet) 230 Feet   Assistive device Prosthesis;Rolling walker;None   Gait Pattern Step-through pattern;Decreased weight shift to right;Trunk flexed;Decreased trunk rotation   Ambulation Surface Level;Indoor   Stairs Yes   Stairs Assistance 5: Supervision;6: Modified independent (Device/Increase time)   Stair Management Technique Two rails;Step to pattern   Ramp 6: Modified independent (Device)   Ramp Details (indicate cue type and reason) With RW, no LOB, good pattern   Curb 6: Modified independent (Device/increase time)   Curb Details (indicate cue type and reason) with RW, good pattern     Prosthetics   Prosthetic Care Comments  Wearing 12hrs/day;  drying every 6 hrs. using sweat band and antiperserent   Current prosthetic wear tolerance (days/week)  daily   Residual limb condition  no heat rash noted   Education Provided Residual limb care;Correct ply sock adjustment;Proper wear schedule/adjustment;Proper weight-bearing schedule/adjustment   Person(s) Educated Patient;Spouse   Education Method Explanation;Demonstration   Education Method Verbalized understanding   Donning Prosthesis Modified independent (device/increased time)   Doffing Prosthesis Modified independent (device/increased time)             Balance Exercises - 07/02/16 1431      Balance Exercises: Standing   Standing Eyes Opened Wide (BOA)  intermittent UE support, weight shifting A/P, lateral, upper trunk rotation, reaching forward; supervision level.   Other Standing Exercises Picking up objects off the the floor with UE support from RW           PT Education - 07/02/16 1432    Education provided Yes   Education Details Discussed fall risk when using SPC; pt requiring min guard for balance. Discussed goals checked, continuing POC,  and LTGs.   Person(s) Educated Patient;Spouse   Methods Explanation   Comprehension Verbalized understanding          PT Short Term Goals - 07/02/16 1252      PT SHORT TERM GOAL #1   Title Patient demonstrates proper donning & verbalizes proper cleaning of prosthesis. (Target Date: 07/05/2016)   Baseline Met; 07/02/16   Time 1   Period Months   Status Achieved     PT SHORT TERM GOAL #2   Title Patient demonstrates understanding of initial HEP.  (Target Date: 07/05/2016)   Baseline Met; 07/02/16   Time 1   Period Months   Status Achieved     PT SHORT TERM GOAL #3   Title Patient tolerates wear of prosthesis >10 hrs total per day without skin issues or limb pain.  (Target Date: 07/05/2016)   Baseline Met; 07/02/16.   Time 1   Period Months   Status Achieved     PT SHORT TERM GOAL #4   Title Patient ambulates 200' with RW & prosthesis with supervision.  (Target Date: 07/05/2016)   Baseline Met; 07/02/16  Time 1   Period Months   Status On-going     PT SHORT TERM GOAL #5   Title Patient negotiates ramps, curbs with RW & stairs with 2 rails with supervision.  (Target Date: 07/05/2016)   Baseline Met; 07/02/16   Time 1   Period Months   Status Achieved     PT SHORT TERM GOAL #6   Title Patient able to reach 10" and to floor with UE support on RW safely.  (Target Date: 07/05/2016)   Baseline Met; 07/02/16   Time 1   Period Months   Status Achieved           PT Long Term Goals - 06/13/16 1528      PT LONG TERM GOAL #1   Title Patient tolerates wear of prosthesis all awake hours without skin issues or limb tenderness to enable function throughout his day. (Target Date: 08/03/2016)   Time 2   Period Months   Status On-going     PT LONG TERM GOAL #2   Title Patient verbalizes & demonstrates proper prosthetic care & use to enable safe use of prosthesis. (Target Date: 08/03/2016)   Time 2   Period Months   Status On-going     PT LONG TERM GOAL #3   Title  Patient ambulates with LRAD & prosthesis 500' including outside surfaces modified independent to enable community mobility. (Target Date: 08/03/2016)   Time 2   Period Months   Status On-going     PT LONG TERM GOAL #4   Title Patient negotiates ramps, curbs & stairs with LRAD & prosthesis to enable community access. (Target Date: 08/03/2016)   Time 2   Period Months   Status On-going     PT LONG TERM GOAL #5   Title Berg Balance >36/56 to indicate lower fall risk. (Target Date: 08/03/2016)   Time 2   Period Months   Status On-going     PT LONG TERM GOAL #6   Title Timed Up-Go with LRAD <27 seconds safely to indicate lower fall risk. (Target Date: 08/03/2016)   Time 2   Period Months   Status On-going               Plan - 07/02/16 1434    Clinical Impression Statement Pt has made great progress with mobility meeting all STGs.  Discussed fall risk when using SPC currently; pt requiring min guard for balance ( pt had a LOBx2 when walking to check out at end of session requiring min A and external support to gain balance back).   Rehab Potential Good   PT Frequency 2x / week   PT Duration Other (comment)  9 weeks (60 days)   PT Treatment/Interventions ADLs/Self Care Home Management;DME Instruction;Gait training;Stair training;Functional mobility training;Therapeutic activities;Balance training;Neuromuscular re-education;Patient/family education;Prosthetic Training   PT Next Visit Plan review prosthetic care, gait with RW & prosthesis, balance activities with emphasis on decreased UE reliance   Consulted and Agree with Plan of Care Patient      Patient will benefit from skilled therapeutic intervention in order to improve the following deficits and impairments:  Abnormal gait, Decreased activity tolerance, Decreased balance, Decreased endurance, Decreased knowledge of use of DME, Decreased mobility, Decreased strength, Postural dysfunction, Prosthetic Dependency  Visit  Diagnosis: Other abnormalities of gait and mobility  Unsteadiness on feet  Muscle weakness (generalized)  Other symptoms and signs involving the musculoskeletal system     Problem List Patient Active Problem List   Diagnosis Date  Noted  . Fluid retention   . Labile blood glucose   . Toe erythema   . Hypoglycemia associated with type 2 diabetes mellitus ([place])   . Hyponatremia   . Diarrhea 01/17/2016  . Amputation of right lower extremity below knee with complication ([place]) 51/88/4166  . AKI (acute kidney injury) ([place])   . Type 2 diabetes mellitus with peripheral neuropathy (H[facility])   . Status post aortic valve replacement   . Chronic combined systolic and diastolic congestive heart failure ([place])   . ATN (acute tubular necrosis) ([place])   . Postoperative pain   . Acute blood loss anemia   . Leukocytosis   . Morbid obesity ([place])   . Status post below knee amputation of right lower extremity ([place])   . Insulin dependent diabetes mellitus ([place]) 01/09/2016  . Diabetic foot infection ([place]) 01/09/2016  . Sepsis, unspecified organism ([place]) 01/09/2016  . PAF (paroxysmal atrial fibrillation) ([place]) 04/22/2015  . Essential hypertension 04/22/2015  . Obesity 04/22/2015  . Dyspnea 04/20/2015  . Acute on chronic diastolic CHF (congestive heart failure) ([place]) 04/19/2015  . Cellulitis of leg 11/23/2014  . Diabetes ([place]) 09/13/2014  . Chronic anticoagulation 09/13/2014  . Atrial flutter, unspecified   . Paroxysmal atrial fibrillation (H[facility])   . Perirectal abscess   . Other hemorrhoids   . Tenosynovitis of finger 05/05/2014  . Cellulitis and abscess of digit 05/05/2014  . Back pain   . H/O aortic valve replacement with tissue graft   . Hypertriglyceridemia   . Hypertension     Bjorn Loser, PTA  07/02/16, 2:39 PM [facility] [address] [place], [place], [address] Phone: [phone]   Fax:  [phone]  Name: Thomas Wright MRN: [id number] Date of Birth: Jul 23, 1946

## 2016-07-04 ENCOUNTER — Ambulatory Visit: Payer: Commercial Managed Care - HMO | Admitting: Physical Therapy

## 2016-07-04 DIAGNOSIS — M6281 Muscle weakness (generalized): Secondary | ICD-10-CM | POA: Diagnosis not present

## 2016-07-04 DIAGNOSIS — I509 Heart failure, unspecified: Secondary | ICD-10-CM | POA: Diagnosis not present

## 2016-07-04 DIAGNOSIS — R29898 Other symptoms and signs involving the musculoskeletal system: Secondary | ICD-10-CM

## 2016-07-04 DIAGNOSIS — Z89511 Acquired absence of right leg below knee: Secondary | ICD-10-CM | POA: Diagnosis not present

## 2016-07-04 DIAGNOSIS — R2681 Unsteadiness on feet: Secondary | ICD-10-CM | POA: Diagnosis not present

## 2016-07-04 DIAGNOSIS — R2689 Other abnormalities of gait and mobility: Secondary | ICD-10-CM | POA: Diagnosis not present

## 2016-07-04 DIAGNOSIS — Z23 Encounter for immunization: Secondary | ICD-10-CM | POA: Diagnosis not present

## 2016-07-04 NOTE — Therapy (Signed)
[facility] [address] [place], [place], [address] Phone: [phone]   Fax:  [phone]  Physical Therapy Treatment  Patient Details  Name: Thomas Wright MRN: [id number] Date of Birth: 1946/03/18 Referring Provider: [name], MD  Encounter Date: 07/04/2016      PT End of Session - 07/04/16 1316    Visit Number 8   Number of Visits 18   Date for PT Re-Evaluation 08/03/16   Authorization Type Medicare G-Code & Progress Note   PT Start Time 1237   PT Stop Time 1315   PT Time Calculation (min) 38 min   Equipment Utilized During Treatment Gait belt   Activity Tolerance Patient tolerated treatment well   Behavior During Therapy [facility] for tasks assessed/performed      Past Medical History:  Diagnosis Date  . Arthritis    .  Marland Kitchen Cardiomyopathy ([place])    EF originally 20%-now 45%, no CAD on cath  . Chronic combined systolic and diastolic CHF (congestive heart failure) ([place])    a. 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 Reported h/o cath in 2008 without significant CAD.  Marland Kitchen Depression    "a little right now" (04/19/2015)  . Hyperlipidemia   . Hypertension   . Hypertriglyceridemia   . PAF (paroxysmal atrial fibrillation) ([place])    a. Postop 2008. b. recurrent afib 08/2014 with LAA clot noted on TEE. EF was down again at 25%. He was anticoagulated with Eliquis and placed on Amiodarone.He ultimately underwent TEE-DCCV 08/2014.  . S/P aortic valve replacement with bioprosthetic valve    a. bioprosthetic AVR in 05/2007 in [place].  . Type II diabetes mellitus ([place])     Past Surgical History:  Procedure Laterality Date  . AMPUTATION Right 07/29/2014   Procedure: AMPUTATION RIGHT LONG FINGER;  Surgeon: [name], MD;  Location: [facility];  Service: Orthopedics;  Laterality: Right;  . AMPUTATION Right 01/13/2016   Procedure: RIGHT BELOW KNEE  AMPUTATION;  Surgeon: Newt Minion, MD;  Location: [facility];  Service: Orthopedics;  Laterality: Right;  . AORTIC VALVE REPLACEMENT  05/2007   with tissue graft  . APPLICATION OF WOUND VAC Right 01/13/2016   Procedure: APPLICATION OF WOUND VAC;  Surgeon: Newt Minion, MD;  Location: [facility];  Service: Orthopedics;  Laterality: Right;  . CARDIAC CATHETERIZATION  "several"  . CARDIAC VALVE REPLACEMENT    . CARDIOVERSION N/A 07/23/2014   Procedure: CARDIOVERSION;  Surgeon: Josue Hector, MD;  Location: [facility];  Service: Cardiovascular;  Laterality: N/A;  . CARDIOVERSION N/A 09/01/2014   Procedure: CARDIOVERSION;  Surgeon: [name], MD;  Location: [facility] ENDOSCOPY;  Service: Cardiovascular;  Laterality: N/A;  . I&D EXTREMITY Right 05/05/2014   Procedure: IRRIGATION AND DEBRIDEMENT EXTREMITY;  Surgeon: [name], MD;  Location: [facility];  Service: Orthopedics;  Laterality: Right;  . TEE WITHOUT CARDIOVERSION N/A 07/23/2014   Procedure: TRANSESOPHAGEAL ECHOCARDIOGRAM (TEE);  Surgeon: Josue Hector, MD;  Location: [facility];  Service: Cardiovascular;  Laterality: N/A;  . TEE WITHOUT CARDIOVERSION N/A 09/01/2014   Procedure: TRANSESOPHAGEAL ECHOCARDIOGRAM (TEE);  Surgeon: [name], MD;  Location: [facility] ENDOSCOPY;  Service: Cardiovascular;  Laterality: N/A;  . TONSILLECTOMY  1954    There were no vitals filed for this visit.      Subjective Assessment - 07/04/16 1249    Subjective Worked on standing exercises with the sink at  home.  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 No/denies                         [facility] Adult PT Treatment/Exercise - 07/04/16 0001      Ambulation/Gait   Ambulation/Gait Yes   Ambulation/Gait Assistance 4: Min guard    Ambulation/Gait Assistance Details Worked on balance and visual scanning   Ambulation Distance (Feet) 250 Feet   Assistive device Straight cane;Prosthesis   Gait Pattern Step-through pattern;Decreased weight shift to right;Trunk flexed;Decreased trunk rotation   Ambulation Surface Level;Indoor     Prosthetics   Prosthetic Care Comments  Wearing 12hrs/day;  drying every 6 hrs. using sweat band and antiperserent   Current prosthetic wear tolerance (days/week)  daily             Balance Exercises - 07/04/16 1250      Balance Exercises: Standing   Standing Eyes Opened Wide (BOA);Head turns  weight shifts, A/P, lateral, upper trunk rotation   Stepping Strategy Anterior;Posterior  step and weight shift with SPC and intermittent ex UE support           PT Education - 07/04/16 1317    Education provided Yes   Education Details How to use SPC with standing at the [facility] exercises   Person(s) Educated Patient   Methods Explanation;Demonstration   Comprehension Verbalized understanding          PT Short Term Goals - 07/02/16 1252      PT SHORT TERM GOAL #1   Title Patient demonstrates proper donning & verbalizes proper cleaning of prosthesis. (Target Date: 07/05/2016)   Baseline Met; 07/02/16   Time 1   Period Months   Status Achieved     PT SHORT TERM GOAL #2   Title Patient demonstrates understanding of initial HEP.  (Target Date: 07/05/2016)   Baseline Met; 07/02/16   Time 1   Period Months   Status Achieved     PT SHORT TERM GOAL #3   Title Patient tolerates wear of prosthesis >10 hrs total per day without skin issues or limb pain.  (Target Date: 07/05/2016)   Baseline Met; 07/02/16.   Time 1   Period Months   Status Achieved     PT SHORT TERM GOAL #4   Title Patient ambulates 200' with RW & prosthesis with supervision.  (Target Date: 07/05/2016)   Baseline Met; 07/02/16   Time 1   Period Months   Status On-going     PT SHORT TERM GOAL #5   Title Patient  negotiates ramps, curbs with RW & stairs with 2 rails with supervision.  (Target Date: 07/05/2016)   Baseline Met; 07/02/16   Time 1   Period Months   Status Achieved     PT SHORT TERM GOAL #6   Title Patient able to reach 10" and to floor with UE support on RW safely.  (Target Date: 07/05/2016)   Baseline Met; 07/02/16   Time 1   Period Months   Status Achieved           PT Long Term Goals - 06/13/16 1528      PT LONG TERM GOAL #1   Title Patient tolerates wear of prosthesis all awake hours without skin issues or limb tenderness to  enable function throughout his day. (Target Date: 08/03/2016)   Time 2   Period Months   Status On-going     PT LONG TERM GOAL #2   Title Patient verbalizes & demonstrates proper prosthetic care & use to enable safe use of prosthesis. (Target Date: 08/03/2016)   Time 2   Period Months   Status On-going     PT LONG TERM GOAL #3   Title Patient ambulates with LRAD & prosthesis 500' including outside surfaces modified independent to enable community mobility. (Target Date: 08/03/2016)   Time 2   Period Months   Status On-going     PT LONG TERM GOAL #4   Title Patient negotiates ramps, curbs & stairs with LRAD & prosthesis to enable community access. (Target Date: 08/03/2016)   Time 2   Period Months   Status On-going     PT LONG TERM GOAL #5   Title Berg Balance >36/56 to indicate lower fall risk. (Target Date: 08/03/2016)   Time 2   Period Months   Status On-going     PT LONG TERM GOAL #6   Title Timed Up-Go with LRAD <27 seconds safely to indicate lower fall risk. (Target Date: 08/03/2016)   Time 2   Period Months   Status On-going               Plan - 07/04/16 1309    Clinical Impression Statement Progressed balance training with dynamic (low level) standing with SPC; pt requires min A.  Stepping strategies with weight shifts, pt required [facility] and a 2nd external support intermittently.   Rehab Potential Good   PT  Frequency 2x / week   PT Duration Other (comment)  9 weeks (60 days)   PT Treatment/Interventions ADLs/Self Care Home Management;DME Instruction;Gait training;Stair training;Functional mobility training;Therapeutic activities;Balance training;Neuromuscular re-education;Patient/family education;Prosthetic Training   PT Next Visit Plan review prosthetic care, gait with RW & prosthesis, balance activities with emphasis on decreased UE reliance   Consulted and Agree with Plan of Care Patient      Patient will benefit from skilled therapeutic intervention in order to improve the following deficits and impairments:  Abnormal gait, Decreased activity tolerance, Decreased balance, Decreased endurance, Decreased knowledge of use of DME, Decreased mobility, Decreased strength, Postural dysfunction, Prosthetic Dependency  Visit Diagnosis: Other abnormalities of gait and mobility  Unsteadiness on feet  Muscle weakness (generalized)  Other symptoms and signs involving the musculoskeletal system     Problem List Patient Active Problem List   Diagnosis Date Noted  . Fluid retention   . Labile blood glucose   . Toe erythema   . Hypoglycemia associated with type 2 diabetes mellitus ([place])   . Hyponatremia   . Diarrhea 01/17/2016  . Amputation of right lower extremity below knee with complication ([place]) 94/32/7614  . AKI (acute kidney injury) ([place])   . Type 2 diabetes mellitus with peripheral neuropathy (H[facility])   . Status post aortic valve replacement   . Chronic combined systolic and diastolic congestive heart failure ([place])   . ATN (acute tubular necrosis) ([place])   . Postoperative pain   . Acute blood loss anemia   . Leukocytosis   . Morbid obesity ([place])   . Status post below knee amputation of right lower extremity ([place])   . Insulin dependent diabetes mellitus ([place]) 01/09/2016  . Diabetic foot infection ([place]) 01/09/2016  . Sepsis, unspecified organism ([place]) 01/09/2016  . PAF (paroxysmal  atrial fibrillation) ([place]) 04/22/2015  . Essential hypertension 04/22/2015  .  Obesity 04/22/2015  . Dyspnea 04/20/2015  . Acute on chronic diastolic CHF (congestive heart failure) ([place]) 04/19/2015  . Cellulitis of leg 11/23/2014  . Diabetes ([place]) 09/13/2014  . Chronic anticoagulation 09/13/2014  . Atrial flutter, unspecified   . Paroxysmal atrial fibrillation (H[facility])   . Perirectal abscess   . Other hemorrhoids   . Tenosynovitis of finger 05/05/2014  . Cellulitis and abscess of digit 05/05/2014  . Back pain   . H/O aortic valve replacement with tissue graft   . Hypertriglyceridemia   . Hypertension     Bjorn Loser, PTA  07/04/16, 2:14 PM [facility] [address] [place], [place], [address] Phone: [phone]   Fax:  [phone]  Name: Thomas Wright MRN: [id number] Date of Birth: 06/16/1946

## 2016-07-05 DIAGNOSIS — I509 Heart failure, unspecified: Secondary | ICD-10-CM | POA: Diagnosis not present

## 2016-07-05 DIAGNOSIS — Z89511 Acquired absence of right leg below knee: Secondary | ICD-10-CM | POA: Diagnosis not present

## 2016-07-09 ENCOUNTER — Ambulatory Visit: Payer: Commercial Managed Care - HMO | Admitting: Physical Therapy

## 2016-07-09 ENCOUNTER — Ambulatory Visit (INDEPENDENT_AMBULATORY_CARE_PROVIDER_SITE_OTHER): Payer: Self-pay | Admitting: Orthopedic Surgery

## 2016-07-11 ENCOUNTER — Ambulatory Visit: Payer: Commercial Managed Care - HMO | Admitting: Physical Therapy

## 2016-07-11 ENCOUNTER — Encounter: Payer: Self-pay | Admitting: Physical Therapy

## 2016-07-11 DIAGNOSIS — R2689 Other abnormalities of gait and mobility: Secondary | ICD-10-CM | POA: Diagnosis not present

## 2016-07-11 DIAGNOSIS — M6281 Muscle weakness (generalized): Secondary | ICD-10-CM

## 2016-07-11 DIAGNOSIS — R2681 Unsteadiness on feet: Secondary | ICD-10-CM

## 2016-07-11 DIAGNOSIS — R29898 Other symptoms and signs involving the musculoskeletal system: Secondary | ICD-10-CM | POA: Diagnosis not present

## 2016-07-11 NOTE — Therapy (Signed)
[facility] [address] [place], [place], [address] Phone: [phone]   Fax:  [phone]  Physical Therapy Treatment  Patient Details  Name: Thomas Wright MRN: [id number] Date of Birth: [birth year] Referring Provider: [name], MD  Encounter Date: 07/11/2016      PT End of Session - 07/11/16 2158    Visit Number 9   Number of Visits 18   Date for PT Re-Evaluation 08/03/16   Authorization Type Medicare G-Code & Progress Note   PT Start Time 1145   PT Stop Time 1230   PT Time Calculation (min) 45 min   Equipment Utilized During Treatment Gait belt   Activity Tolerance Patient tolerated treatment well   Behavior During Therapy [facility] for tasks assessed/performed      Past Medical History:  Diagnosis Date  . Arthritis    .  Marland Kitchen Cardiomyopathy ([place])    EF originally 20%-now 45%, no CAD on cath  . Chronic combined systolic and diastolic CHF (congestive heart failure) ([place])    a. 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 Reported h/o cath in 2008 without significant CAD.  Marland Kitchen Depression    "a little right now" (04/19/2015)  . Hyperlipidemia   . Hypertension   . Hypertriglyceridemia   . PAF (paroxysmal atrial fibrillation) ([place])    a. Postop 2008. b. recurrent afib 08/2014 with LAA clot noted on TEE. EF was down again at 25%. He was anticoagulated with Eliquis and placed on Amiodarone.He ultimately underwent TEE-DCCV 08/2014.  . S/P aortic valve replacement with bioprosthetic valve    a. bioprosthetic AVR in 05/2007 in [place].  . Type II diabetes mellitus ([place])     Past Surgical History:  Procedure Laterality Date  . AMPUTATION Right 07/29/2014   Procedure: AMPUTATION RIGHT LONG FINGER;  Surgeon: [name], MD;  Location: [facility];  Service: Orthopedics;  Laterality: Right;  . AMPUTATION Right 01/13/2016   Procedure: RIGHT BELOW KNEE  AMPUTATION;  Surgeon: Newt Minion, MD;  Location: [facility];  Service: Orthopedics;  Laterality: Right;  . AORTIC VALVE REPLACEMENT  05/2007   with tissue graft  . APPLICATION OF WOUND VAC Right 01/13/2016   Procedure: APPLICATION OF WOUND VAC;  Surgeon: Newt Minion, MD;  Location: [facility];  Service: Orthopedics;  Laterality: Right;  . CARDIAC CATHETERIZATION  "several"  . CARDIAC VALVE REPLACEMENT    . CARDIOVERSION N/A 07/23/2014   Procedure: CARDIOVERSION;  Surgeon: Josue Hector, MD;  Location: [facility];  Service: Cardiovascular;  Laterality: N/A;  . CARDIOVERSION N/A 09/01/2014   Procedure: CARDIOVERSION;  Surgeon: [name], MD;  Location: [facility] ENDOSCOPY;  Service: Cardiovascular;  Laterality: N/A;  . I&D EXTREMITY Right 05/05/2014   Procedure: IRRIGATION AND DEBRIDEMENT EXTREMITY;  Surgeon: [name], MD;  Location: [facility];  Service: Orthopedics;  Laterality: Right;  . TEE WITHOUT CARDIOVERSION N/A 07/23/2014   Procedure: TRANSESOPHAGEAL ECHOCARDIOGRAM (TEE);  Surgeon: Josue Hector, MD;  Location: [facility];  Service: Cardiovascular;  Laterality: N/A;  . TEE WITHOUT CARDIOVERSION N/A 09/01/2014   Procedure: TRANSESOPHAGEAL ECHOCARDIOGRAM (TEE);  Surgeon: [name], MD;  Location[facility] ENDOSCOPY;  Service: Cardiovascular;  Laterality: N/A;  . TONSILLECTOMY  1954    There were no vitals filed for this visit.      Subjective Assessment - 07/11/16 1153    Subjective He has not been feeling well for last 5 days.  He is congested but no fever. Blood sugars are normal. He is wearing prosthesis 12-16 hrs which is most of awake hours or tenderness.    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 With cane with quad tip, tactile & verbal cues around furniture for houshehold gait.    Ambulation Distance (Feet) 250 Feet  250' with RW & 100' with cane   Assistive device Prosthesis;Straight cane;Rolling walker  straight cane with quad tip    Gait Pattern Step-through pattern;Decreased weight shift to right;Trunk flexed;Decreased trunk rotation   Ambulation Surface Indoor;Level     Self-Care   Self-Care ADL's   ADL's kitchen ADLs reaching into lower & upper cabinets and moving heavy or hot pots along counter with PT demo, verbal cues and pt return demo & verbalized understanding.      Prosthetics   Prosthetic Care Comments  wearing 12-16hrs/day. Verbal cues on antiperspirant in morning prior to donning prosthesis. PT demo use of baby oil on patella to decrease friction.    Current prosthetic wear tolerance (days/week)  daily   Current prosthetic wear tolerance (#hours/day)  Pt reports wearing prosthesis most of awake hours (12-16hrs).    Edema none   Residual limb condition  2 superficial wounds appear from scratching (one on patella and one on medial femoral condyle)   Education Provided Skin check;Residual limb care;Prosthetic cleaning;Proper Doffing;Correct ply sock adjustment   Person(s) Educated Patient   Education Method Explanation;Demonstration;Tactile cues;Verbal cues   Education Method Verbalized understanding;Returned demonstration;Tactile cues required;Verbal cues required;Needs further instruction   Doffing Prosthesis Supervision  cues how to disengage pin lock                PT Education - 07/11/16 1145    Education provided Yes   Education Details [employer] in [place] as possible assistance with home modifications.     Person(s) Educated Patient   Methods Explanation;Verbal cues;Other (comment)  internet printout   Comprehension Verbalized understanding;Verbal cues required          PT Short Term Goals - 07/02/16 1252      PT SHORT TERM GOAL #1   Title Patient demonstrates proper donning & verbalizes proper cleaning of prosthesis. (Target Date: 07/05/2016)   Baseline Met; 07/02/16   Time 1   Period Months   Status Achieved     PT SHORT TERM GOAL #2   Title Patient demonstrates understanding of initial HEP.  (Target Date: 07/05/2016)   Baseline Met; 07/02/16   Time 1   Period Months   Status Achieved     PT SHORT TERM GOAL #3   Title Patient tolerates wear of prosthesis >10 hrs total per day without skin issues or limb pain.  (Target Date: 07/05/2016)   Baseline Met; 07/02/16.   Time 1   Period Months   Status Achieved     PT SHORT TERM GOAL #4   Title  Patient ambulates 200' with RW & prosthesis with supervision.  (Target Date: 07/05/2016)   Baseline Met; 07/02/16   Time 1   Period Months   Status On-going     PT SHORT TERM GOAL #5   Title Patient negotiates ramps, curbs with RW & stairs with 2 rails with supervision.  (Target Date: 07/05/2016)   Baseline Met; 07/02/16   Time 1   Period Months   Status Achieved     PT SHORT TERM GOAL #6   Title Patient able to reach 10" and to floor with UE support on RW safely.  (Target Date: 07/05/2016)   Baseline Met; 07/02/16   Time 1   Period Months   Status Achieved           PT Long Term Goals - 06/13/16 1528      PT LONG TERM GOAL #1   Title Patient tolerates wear of prosthesis all awake hours without skin issues or limb tenderness to enable function throughout his day. (Target Date: 08/03/2016)   Time 2   Period Months   Status On-going     PT LONG TERM GOAL #2   Title Patient verbalizes & demonstrates proper prosthetic care & use to enable safe use of prosthesis. (Target Date: 08/03/2016)   Time 2   Period Months    Status On-going     PT LONG TERM GOAL #3   Title Patient ambulates with LRAD & prosthesis 500' including outside surfaces modified independent to enable community mobility. (Target Date: 08/03/2016)   Time 2   Period Months   Status On-going     PT LONG TERM GOAL #4   Title Patient negotiates ramps, curbs & stairs with LRAD & prosthesis to enable community access. (Target Date: 08/03/2016)   Time 2   Period Months   Status On-going     PT LONG TERM GOAL #5   Title Berg Balance >36/56 to indicate lower fall risk. (Target Date: 08/03/2016)   Time 2   Period Months   Status On-going     PT LONG TERM GOAL #6   Title Timed Up-Go with LRAD <27 seconds safely to indicate lower fall risk. (Target Date: 08/03/2016)   Time 2   Period Months   Status On-going               Plan - 07/11/16 2158    Clinical Impression Statement Patient improved kitchen ADLs with skilled instruction in technique. Pt is tolerating increased prosthesis wear but sweating is causing itching resulting 2 superficial scratches on limb.    Rehab Potential Good   PT Frequency 2x / week   PT Duration Other (comment)  9 weeks (60 days)   PT Treatment/Interventions ADLs/Self Care Home Management;DME Instruction;Gait training;Stair training;Functional mobility training;Therapeutic activities;Balance training;Neuromuscular re-education;Patient/family education;Prosthetic Training   PT Next Visit Plan Do G-code, review prosthetic care, gait with RW & prosthesis, balance activities with emphasis on decreased UE reliance   Consulted and Agree with Plan of Care Patient      Patient will benefit from skilled therapeutic intervention in order to improve the following deficits and impairments:  Abnormal gait, Decreased activity tolerance, Decreased balance, Decreased endurance, Decreased knowledge of use of DME, Decreased mobility, Decreased strength, Postural dysfunction, Prosthetic Dependency  Visit Diagnosis: Other  abnormalities of gait and mobility  Unsteadiness on feet  Muscle weakness (generalized)  Other symptoms and signs involving the musculoskeletal system     Problem List Patient Active Problem List   Diagnosis  Date Noted  . Fluid retention   . Labile blood glucose   . Toe erythema   . Hypoglycemia associated with type 2 diabetes mellitus ([place])   . Hyponatremia   . Diarrhea 01/17/2016  . Amputation of right lower extremity below knee with complication ([place]) 20/74/0979  . AKI (acute kidney injury) ([place])   . Type 2 diabetes mellitus with peripheral neuropathy ([facility])   . Status post aortic valve replacement   . Chronic combined systolic and diastolic congestive heart failure ([place])   . ATN (acute tubular necrosis) ([place])   . Postoperative pain   . Acute blood loss anemia   . Leukocytosis   . Morbid obesity ([place])   . Status post below knee amputation of right lower extremity ([place])   . Insulin dependent diabetes mellitus ([place]) 01/09/2016  . Diabetic foot infection ([place]) 01/09/2016  . Sepsis, unspecified organism ([place]) 01/09/2016  . PAF (paroxysmal atrial fibrillation) ([place]) 04/22/2015  . Essential hypertension 04/22/2015  . Obesity 04/22/2015  . Dyspnea 04/20/2015  . Acute on chronic diastolic CHF (congestive heart failure) ([place]) 04/19/2015  . Cellulitis of leg 11/23/2014  . Diabetes ([place]) 09/13/2014  . Chronic anticoagulation 09/13/2014  . Atrial flutter, unspecified   . Paroxysmal atrial fibrillation ([facility])   . Perirectal abscess   . Other hemorrhoids   . Tenosynovitis of finger 05/05/2014  . Cellulitis and abscess of digit 05/05/2014  . Back pain   . H/O aortic valve replacement with tissue graft   . Hypertriglyceridemia   . Hypertension     Brax Walen PT, DPT 07/11/2016, 10:06 PM  [facility] [address] [place], [place], [address] Phone: [phone]   Fax:  [phone]  Name: Thomas Wright MRN:  [id number] Date of Birth: 12-09-1945

## 2016-07-16 ENCOUNTER — Ambulatory Visit: Payer: Commercial Managed Care - HMO | Admitting: Physical Therapy

## 2016-07-16 DIAGNOSIS — R2681 Unsteadiness on feet: Secondary | ICD-10-CM | POA: Diagnosis not present

## 2016-07-16 DIAGNOSIS — R29898 Other symptoms and signs involving the musculoskeletal system: Secondary | ICD-10-CM

## 2016-07-16 DIAGNOSIS — M6281 Muscle weakness (generalized): Secondary | ICD-10-CM

## 2016-07-16 DIAGNOSIS — R2689 Other abnormalities of gait and mobility: Secondary | ICD-10-CM | POA: Diagnosis not present

## 2016-07-16 NOTE — Therapy (Signed)
[facility] [address] [place], [place], [address] Phone: [phone]   Fax:  [phone]  Physical Therapy Treatment  Patient Details  Name: Thomas Wright MRN: [id number] Date of Birth: Dec 09, 1945 Referring Provider: [name], MD  Encounter Date: 07/16/2016      PT End of Session - 07/16/16 1316    Visit Number 10   Number of Visits 18   Date for PT Re-Evaluation 08/03/16   Authorization Type Medicare G-Code & Progress Note   PT Start Time 1234   PT Stop Time 1315   PT Time Calculation (min) 41 min   Equipment Utilized During Treatment Gait belt   Activity Tolerance Patient tolerated treatment well   Behavior During Therapy WFL for tasks assessed/performed      Past Medical History:  Diagnosis Date  . Arthritis    .  Marland Kitchen Cardiomyopathy ([place])    EF originally 20%-now 45%, no CAD on cath  . Chronic combined systolic and diastolic CHF (congestive heart failure) ([place])    a. 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 Reported h/o cath in 2008 without significant CAD.  Marland Kitchen Depression    "a little right now" (04/19/2015)  . Hyperlipidemia   . Hypertension   . Hypertriglyceridemia   . PAF (paroxysmal atrial fibrillation) ([place])    a. Postop 2008. b. recurrent afib 08/2014 with LAA clot noted on TEE. EF was down again at 25%. He was anticoagulated with Eliquis and placed on Amiodarone.He ultimately underwent TEE-DCCV 08/2014.  . S/P aortic valve replacement with bioprosthetic valve    a. bioprosthetic AVR in 05/2007 in [place].  . Type II diabetes mellitus ([place])     Past Surgical History:  Procedure Laterality Date  . AMPUTATION Right 07/29/2014   Procedure: AMPUTATION RIGHT LONG FINGER;  Surgeon: [name], MD;  Location: [facility];  Service: Orthopedics;  Laterality: Right;  . AMPUTATION Right 01/13/2016   Procedure: RIGHT BELOW KNEE  AMPUTATION;  Surgeon: Newt Minion, MD;  Location: [facility];  Service: Orthopedics;  Laterality: Right;  . AORTIC VALVE REPLACEMENT  05/2007   with tissue graft  . APPLICATION OF WOUND VAC Right 01/13/2016   Procedure: APPLICATION OF WOUND VAC;  Surgeon: Newt Minion, MD;  Location: [facility];  Service: Orthopedics;  Laterality: Right;  . CARDIAC CATHETERIZATION  "several"  . CARDIAC VALVE REPLACEMENT    . CARDIOVERSION N/A 07/23/2014   Procedure: CARDIOVERSION;  Surgeon: Josue Hector, MD;  Location: [facility];  Service: Cardiovascular;  Laterality: N/A;  . CARDIOVERSION N/A 09/01/2014   Procedure: CARDIOVERSION;  Surgeon: [name], MD;  Location: [facility] ENDOSCOPY;  Service: Cardiovascular;  Laterality: N/A;  . I&D EXTREMITY Right 05/05/2014   Procedure: IRRIGATION AND DEBRIDEMENT EXTREMITY;  Surgeon: [name], MD;  Location: [facility];  Service: Orthopedics;  Laterality: Right;  . TEE WITHOUT CARDIOVERSION N/A 07/23/2014   Procedure: TRANSESOPHAGEAL ECHOCARDIOGRAM (TEE);  Surgeon: Josue Hector, MD;  Location: [facility];  Service: Cardiovascular;  Laterality: N/A;  . TEE WITHOUT CARDIOVERSION N/A 09/01/2014   Procedure: TRANSESOPHAGEAL ECHOCARDIOGRAM (TEE);  Surgeon: [name], MD;  Location: [facility] ENDOSCOPY;  Service: Cardiovascular;  Laterality: N/A;  . TONSILLECTOMY  1954    There were no vitals filed for this visit.      Subjective Assessment - 07/16/16 1235    Subjective Pt had a cold over the weekend.  Pt has  been cooking and clean at home. Pt goes to [facility].   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 Yes                         [facility] Adult PT Treatment/Exercise - 07/16/16 0001      Ambulation/Gait    Ambulation/Gait Yes   Ambulation/Gait Assistance 4: Min guard   Ambulation/Gait Assistance Details working on balance with head turns and trialled Left AFO- [place] Walk on (PLS)   Ambulation Distance (Feet) 230 Feet  + 115   Assistive device Straight cane;Prosthesis;Other (Comment)  Left AFO   Gait Pattern Step-through pattern;Decreased weight shift to right;Trunk flexed;Decreased trunk rotation   Ambulation Surface Level;Indoor     Prosthetics   Prosthetic Care Comments  wearing 12-16hrs/day. Verbal cues on antiperspirant in morning prior to donning prosthesis. Pt using baby oil on patella to decrease friction.    Current prosthetic wear tolerance (days/week)  daily   Current prosthetic wear tolerance (#hours/day)  Pt reports wearing prosthesis most of awake hours (12-16hrs).    Residual limb condition  Reports scratches are better.   Education Provided Residual limb care;Prosthetic cleaning;Correct ply sock adjustment   Person(s) Educated Patient   Education Method Explanation   Education Method Verbalized understanding             Balance Exercises - 07/16/16 1243      Balance Exercises: Standing   Wall Bumps Hip  initially ankle then ankle and hip strategies, intermittent UE support   Sit to Stand Time x8 attempting no UE support, elevated mat, min guard   Lift / Chop Limitations with green weighted ball, without UE support, min A             PT Short Term Goals - 07/02/16 1252      PT SHORT TERM GOAL #1   Title Patient demonstrates proper donning & verbalizes proper cleaning of prosthesis. (Target Date: 07/05/2016)   Baseline Met; 07/02/16   Time 1   Period Months   Status Achieved     PT SHORT TERM GOAL #2   Title Patient demonstrates understanding of initial HEP.  (Target Date: 07/05/2016)   Baseline Met; 07/02/16   Time 1   Period Months   Status Achieved     PT SHORT TERM GOAL #3   Title Patient tolerates wear of prosthesis >10 hrs total per day  without skin issues or limb pain.  (Target Date: 07/05/2016)   Baseline Met; 07/02/16.   Time 1   Period Months   Status Achieved     PT SHORT TERM GOAL #4   Title Patient ambulates 200' with RW & prosthesis with supervision.  (Target Date: 07/05/2016)   Baseline Met; 07/02/16   Time 1   Period Months   Status On-going     PT SHORT TERM GOAL #5   Title Patient negotiates ramps, curbs with RW & stairs with 2 rails with supervision.  (Target Date: 07/05/2016)   Baseline Met; 07/02/16   Time 1   Period Months   Status Achieved     PT SHORT TERM GOAL #6   Title Patient able to reach 10" and to floor with UE support on RW safely.  (Target  Date: 07/05/2016)   Baseline Met; 07/02/16   Time 1   Period Months   Status Achieved           PT Long Term Goals - 06/13/16 1528      PT LONG TERM GOAL #1   Title Patient tolerates wear of prosthesis all awake hours without skin issues or limb tenderness to enable function throughout his day. (Target Date: 08/03/2016)   Time 2   Period Months   Status On-going     PT LONG TERM GOAL #2   Title Patient verbalizes & demonstrates proper prosthetic care & use to enable safe use of prosthesis. (Target Date: 08/03/2016)   Time 2   Period Months   Status On-going     PT LONG TERM GOAL #3   Title Patient ambulates with LRAD & prosthesis 500' including outside surfaces modified independent to enable community mobility. (Target Date: 08/03/2016)   Time 2   Period Months   Status On-going     PT LONG TERM GOAL #4   Title Patient negotiates ramps, curbs & stairs with LRAD & prosthesis to enable community access. (Target Date: 08/03/2016)   Time 2   Period Months   Status On-going     PT LONG TERM GOAL #5   Title Berg Balance >36/56 to indicate lower fall risk. (Target Date: 08/03/2016)   Time 2   Period Months   Status On-going     PT LONG TERM GOAL #6   Title Timed Up-Go with LRAD <27 seconds safely to indicate lower fall risk.  (Target Date: 08/03/2016)   Time 2   Period Months   Status On-going               Plan - 25-Jul-2016 1324    Clinical Impression Statement Pt did not have Left foot slap with Walk On (PLS) [place] AFO during gait on indoor surface.  Pt continues to require intermittent UE support for standing balance, addressed with ankle/hip strategy activity.   Rehab Potential Good   PT Frequency 2x / week   PT Duration Other (comment)  9 weeks (60 days)   PT Treatment/Interventions ADLs/Self Care Home Management;DME Instruction;Gait training;Stair training;Functional mobility training;Therapeutic activities;Balance training;Neuromuscular re-education;Patient/family education;Prosthetic Training   PT Next Visit Plan , review prosthetic care, gait with RW & prosthesis, balance activities with emphasis on decreased UE reliance   Consulted and Agree with Plan of Care Patient      Patient will benefit from skilled therapeutic intervention in order to improve the following deficits and impairments:  Abnormal gait, Decreased activity tolerance, Decreased balance, Decreased endurance, Decreased knowledge of use of DME, Decreased mobility, Decreased strength, Postural dysfunction, Prosthetic Dependency  Visit Diagnosis: Other abnormalities of gait and mobility  Unsteadiness on feet  Muscle weakness (generalized)  Other symptoms and signs involving the musculoskeletal system       G-Codes - 2016-07-25 1328    Functional Assessment Tool Used Patient supervision in prosthetic care. He is wearing prosthesis daily  for 12-15hrs/day      Problem List Patient Active Problem List   Diagnosis Date Noted  . Fluid retention   . Labile blood glucose   . Toe erythema   . Hypoglycemia associated with type 2 diabetes mellitus ([place])   . Hyponatremia   . Diarrhea 01/17/2016  . Amputation of right lower extremity below knee with complication ([place]) 16/06/9603  . AKI (acute kidney injury) ([place])   . Type 2  diabetes mellitus with peripheral neuropathy (  [place])   . Status post aortic valve replacement   . Chronic combined systolic and diastolic congestive heart failure ([place])   . ATN (acute tubular necrosis) ([place])   . Postoperative pain   . Acute blood loss anemia   . Leukocytosis   . Morbid obesity ([place])   . Status post below knee amputation of right lower extremity ([place])   . Insulin dependent diabetes mellitus ([place]) 01/09/2016  . Diabetic foot infection ([place]) 01/09/2016  . Sepsis, unspecified organism ([place]) 01/09/2016  . PAF (paroxysmal atrial fibrillation) ([place]) 04/22/2015  . Essential hypertension 04/22/2015  . Obesity 04/22/2015  . Dyspnea 04/20/2015  . Acute on chronic diastolic CHF (congestive heart failure) ([place]) 04/19/2015  . Cellulitis of leg 11/23/2014  . Diabetes ([place]) 09/13/2014  . Chronic anticoagulation 09/13/2014  . Atrial flutter, unspecified   . Paroxysmal atrial fibrillation ([facility])   . Perirectal abscess   . Other hemorrhoids   . Tenosynovitis of finger 05/05/2014  . Cellulitis and abscess of digit 05/05/2014  . Back pain   . H/O aortic valve replacement with tissue graft   . Hypertriglyceridemia   . Hypertension     Bjorn Loser, PTA  07/16/16, 1:39 PM [address] [place], [place], [address] Phone: [phone]   Fax:  [phone]  Name: CRIST KRUSZKA MRN: [id number] Date of Birth: 22-Nov-1945

## 2016-07-18 ENCOUNTER — Ambulatory Visit: Payer: Commercial Managed Care - HMO | Admitting: Physical Therapy

## 2016-07-19 ENCOUNTER — Encounter: Payer: Self-pay | Admitting: Physical Therapy

## 2016-07-19 NOTE — Therapy (Signed)
[facility] [address] [place], [place], [address] Phone: [phone]   Fax:  [phone]  Physical Therapy Treatment  Patient Details  Name: Thomas Wright MRN: [id number] Date of Birth: 09/12/1946 Referring Provider: [name], MD  Encounter Date: 07/19/2016    Past Medical History:  Diagnosis Date  . Arthritis    .  Marland Kitchen Cardiomyopathy ([place])    EF originally 20%-now 45%, no CAD on cath  . Chronic combined systolic and diastolic CHF (congestive heart failure) ([place])    a. 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 Reported h/o cath in 2008 without significant CAD.  Marland Kitchen Depression    "a little right now" (04/19/2015)  . Hyperlipidemia   . Hypertension   . Hypertriglyceridemia   . PAF (paroxysmal atrial fibrillation) ([place])    a. Postop 2008. b. recurrent afib 08/2014 with LAA clot noted on TEE. EF was down again at 25%. He was anticoagulated with Eliquis and placed on Amiodarone.He ultimately underwent TEE-DCCV 08/2014.  . S/P aortic valve replacement with bioprosthetic valve    a. bioprosthetic AVR in 05/2007 in [place].  . Type II diabetes mellitus ([place])     Past Surgical History:  Procedure Laterality Date  . AMPUTATION Right 07/29/2014   Procedure: AMPUTATION RIGHT LONG FINGER;  Surgeon: [name], MD;  Location: [facility];  Service: Orthopedics;  Laterality: Right;  . AMPUTATION Right 01/13/2016   Procedure: RIGHT BELOW KNEE AMPUTATION;  Surgeon: Newt Minion, MD;  Location: [facility];  Service: Orthopedics;  Laterality: Right;  . AORTIC VALVE REPLACEMENT  05/2007   with tissue graft  . APPLICATION OF WOUND VAC Right 01/13/2016   Procedure: APPLICATION OF WOUND VAC;  Surgeon: Newt Minion, MD;  Location: [facility];  Service: Orthopedics;  Laterality: Right;  . CARDIAC CATHETERIZATION  "several"  . CARDIAC VALVE REPLACEMENT    . CARDIOVERSION N/A  07/23/2014   Procedure: CARDIOVERSION;  Surgeon: Josue Hector, MD;  Location: [facility];  Service: Cardiovascular;  Laterality: N/A;  . CARDIOVERSION N/A 09/01/2014   Procedure: CARDIOVERSION;  Surgeon: [name], MD;  Location: [facility] ENDOSCOPY;  Service: Cardiovascular;  Laterality: N/A;  . I&D EXTREMITY Right 05/05/2014   Procedure: IRRIGATION AND DEBRIDEMENT EXTREMITY;  Surgeon: [name], MD;  Location: [facility];  Service: Orthopedics;  Laterality: Right;  . TEE WITHOUT CARDIOVERSION N/A 07/23/2014   Procedure: TRANSESOPHAGEAL ECHOCARDIOGRAM (TEE);  Surgeon: Josue Hector, MD;  Location: [facility];  Service: Cardiovascular;  Laterality: N/A;  . TEE WITHOUT CARDIOVERSION N/A 09/01/2014   Procedure: TRANSESOPHAGEAL ECHOCARDIOGRAM (TEE);  Surgeon: [name], MD;  Location[facility] ENDOSCOPY;  Service: Cardiovascular;  Laterality: N/A;  . TONSILLECTOMY  1954    There were no vitals filed for this visit.    Physical Therapy Progress Note  Dates of Reporting Period: 06/05/2016 to 07/16/2016  Objective Reports of Subjective Statement: Patient reports wearing prosthesis most of awake hours with improved function for ADLs.   Objective Measurements: Patient is wearing prosthesis >12 hrs /day. He verbalizes proper cleaning & residual limb care. He needs skilled cues for ply sock management with volume changes.   Goal Update: see below  Plan: continue established plan of care.   Reason Skilled Services are Required: Patient needs skilled care to progress mobility with prosthesis to maximal potential with lowering fall risk.  PT Short Term Goals - 07/02/16 1252      PT SHORT TERM GOAL #1   Title Patient demonstrates proper donning & verbalizes proper cleaning of prosthesis. (Target Date: 07/05/2016)   Baseline Met; 07/02/16   Time 1   Period Months   Status Achieved     PT SHORT TERM GOAL #2   Title Patient demonstrates  understanding of initial HEP.  (Target Date: 07/05/2016)   Baseline Met; 07/02/16   Time 1   Period Months   Status Achieved     PT SHORT TERM GOAL #3   Title Patient tolerates wear of prosthesis >10 hrs total per day without skin issues or limb pain.  (Target Date: 07/05/2016)   Baseline Met; 07/02/16.   Time 1   Period Months   Status Achieved     PT SHORT TERM GOAL #4   Title Patient ambulates 200' with RW & prosthesis with supervision.  (Target Date: 07/05/2016)   Baseline Met; 07/02/16   Time 1   Period Months   Status On-going     PT SHORT TERM GOAL #5   Title Patient negotiates ramps, curbs with RW & stairs with 2 rails with supervision.  (Target Date: 07/05/2016)   Baseline Met; 07/02/16   Time 1   Period Months   Status Achieved     PT SHORT TERM GOAL #6   Title Patient able to reach 10" and to floor with UE support on RW safely.  (Target Date: 07/05/2016)   Baseline Met; 07/02/16   Time 1   Period Months   Status Achieved           PT Long Term Goals - 06/13/16 1528      PT LONG TERM GOAL #1   Title Patient tolerates wear of prosthesis all awake hours without skin issues or limb tenderness to enable function throughout his day. (Target Date: 08/03/2016)   Time 2   Period Months   Status On-going     PT LONG TERM GOAL #2   Title Patient verbalizes & demonstrates proper prosthetic care & use to enable safe use of prosthesis. (Target Date: 08/03/2016)   Time 2   Period Months   Status On-going     PT LONG TERM GOAL #3   Title Patient ambulates with LRAD & prosthesis 500' including outside surfaces modified independent to enable community mobility. (Target Date: 08/03/2016)   Time 2   Period Months   Status On-going     PT LONG TERM GOAL #4   Title Patient negotiates ramps, curbs & stairs with LRAD & prosthesis to enable community access. (Target Date: 08/03/2016)   Time 2   Period Months   Status On-going     PT LONG TERM GOAL #5   Title Berg  Balance >36/56 to indicate lower fall risk. (Target Date: 08/03/2016)   Time 2   Period Months   Status On-going     PT LONG TERM GOAL #6   Title Timed Up-Go with LRAD <27 seconds safely to indicate lower fall risk. (Target Date: 08/03/2016)   Time 2   Period Months   Status On-going             Patient will benefit from skilled therapeutic intervention in order to improve the following deficits and impairments:     Visit Diagnosis: No diagnosis found.     Problem List Patient Active Problem List   Diagnosis Date Noted  . Fluid retention   . Labile  blood glucose   . Toe erythema   . Hypoglycemia associated with type 2 diabetes mellitus ([place])   . Hyponatremia   . Diarrhea 01/17/2016  . Amputation of right lower extremity below knee with complication ([place]) 43/20/0379  . AKI (acute kidney injury) ([place])   . Type 2 diabetes mellitus with peripheral neuropathy ([facility])   . Status post aortic valve replacement   . Chronic combined systolic and diastolic congestive heart failure ([place])   . ATN (acute tubular necrosis) ([place])   . Postoperative pain   . Acute blood loss anemia   . Leukocytosis   . Morbid obesity ([place])   . Status post below knee amputation of right lower extremity ([place])   . Insulin dependent diabetes mellitus ([place]) 01/09/2016  . Diabetic foot infection ([place]) 01/09/2016  . Sepsis, unspecified organism ([place]) 01/09/2016  . PAF (paroxysmal atrial fibrillation) ([place]) 04/22/2015  . Essential hypertension 04/22/2015  . Obesity 04/22/2015  . Dyspnea 04/20/2015  . Acute on chronic diastolic CHF (congestive heart failure) ([place]) 04/19/2015  . Cellulitis of leg 11/23/2014  . Diabetes ([place]) 09/13/2014  . Chronic anticoagulation 09/13/2014  . Atrial flutter, unspecified   . Paroxysmal atrial fibrillation ([facility])   . Perirectal abscess   . Other hemorrhoids   . Tenosynovitis of finger 05/05/2014  . Cellulitis and abscess of digit 05/05/2014  . Back pain   . H/O  aortic valve replacement with tissue graft   . Hypertriglyceridemia   . Hypertension     Lliam Hoh  PT, DPT 07/19/2016, 11:39 AM  [facility] [address] [place], [place], [address] Phone: [phone]   Fax:  [phone]  Name: Thomas Wright MRN: [id number] Date of Birth: April 09, 1946

## 2016-07-20 ENCOUNTER — Ambulatory Visit: Payer: Commercial Managed Care - HMO | Admitting: Physical Medicine & Rehabilitation

## 2016-07-23 ENCOUNTER — Ambulatory Visit: Payer: Commercial Managed Care - HMO | Attending: Orthopedic Surgery | Admitting: Physical Therapy

## 2016-07-23 DIAGNOSIS — R2681 Unsteadiness on feet: Secondary | ICD-10-CM | POA: Insufficient documentation

## 2016-07-23 DIAGNOSIS — M6281 Muscle weakness (generalized): Secondary | ICD-10-CM

## 2016-07-23 DIAGNOSIS — R29898 Other symptoms and signs involving the musculoskeletal system: Secondary | ICD-10-CM | POA: Insufficient documentation

## 2016-07-23 DIAGNOSIS — R2689 Other abnormalities of gait and mobility: Secondary | ICD-10-CM | POA: Diagnosis not present

## 2016-07-23 NOTE — Therapy (Signed)
[facility] [address] [place], [place], [address] Phone: [phone]   Fax:  [phone]  Physical Therapy Treatment  Patient Details  Name: Thomas Wright MRN: [id number] Date of Birth: 01-23-1946 Referring Provider: [name], MD  Encounter Date: 07/23/2016      PT End of Session - 07/23/16 1320    Visit Number 11   Number of Visits 18   Date for PT Re-Evaluation 08/03/16   Authorization Type Medicare G-Code & Progress Note   PT Start Time 1235   PT Stop Time 1318   PT Time Calculation (min) 43 min   Equipment Utilized During Treatment Gait belt   Activity Tolerance Patient tolerated treatment well   Behavior During Therapy WFL for tasks assessed/performed      Past Medical History:  Diagnosis Date  . Arthritis    .  Marland Kitchen Cardiomyopathy ([place])    EF originally 20%-now 45%, no CAD on cath  . Chronic combined systolic and diastolic CHF (congestive heart failure) ([place])    a. 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 Reported h/o cath in 2008 without significant CAD.  Marland Kitchen Depression    "a little right now" (04/19/2015)  . Hyperlipidemia   . Hypertension   . Hypertriglyceridemia   . PAF (paroxysmal atrial fibrillation) ([place])    a. Postop 2008. b. recurrent afib 08/2014 with LAA clot noted on TEE. EF was down again at 25%. He was anticoagulated with Eliquis and placed on Amiodarone.He ultimately underwent TEE-DCCV 08/2014.  . S/P aortic valve replacement with bioprosthetic valve    a. bioprosthetic AVR in 05/2007 in [place].  . Type II diabetes mellitus ([place])     Past Surgical History:  Procedure Laterality Date  . AMPUTATION Right 07/29/2014   Procedure: AMPUTATION RIGHT LONG FINGER;  Surgeon: [name], MD;  Location: [facility];  Service: Orthopedics;  Laterality: Right;  . AMPUTATION Right 01/13/2016   Procedure: RIGHT BELOW KNEE  AMPUTATION;  Surgeon: Newt Minion, MD;  Location: [facility];  Service: Orthopedics;  Laterality: Right;  . AORTIC VALVE REPLACEMENT  05/2007   with tissue graft  . APPLICATION OF WOUND VAC Right 01/13/2016   Procedure: APPLICATION OF WOUND VAC;  Surgeon: Newt Minion, MD;  Location: [facility];  Service: Orthopedics;  Laterality: Right;  . CARDIAC CATHETERIZATION  "several"  . CARDIAC VALVE REPLACEMENT    . CARDIOVERSION N/A 07/23/2014   Procedure: CARDIOVERSION;  Surgeon: Josue Hector, MD;  Location: [facility];  Service: Cardiovascular;  Laterality: N/A;  . CARDIOVERSION N/A 09/01/2014   Procedure: CARDIOVERSION;  Surgeon: [name], MD;  Location: [facility] ENDOSCOPY;  Service: Cardiovascular;  Laterality: N/A;  . I&D EXTREMITY Right 05/05/2014   Procedure: IRRIGATION AND DEBRIDEMENT EXTREMITY;  Surgeon: [name], MD;  Location: [facility];  Service: Orthopedics;  Laterality: Right;  . TEE WITHOUT CARDIOVERSION N/A 07/23/2014   Procedure: TRANSESOPHAGEAL ECHOCARDIOGRAM (TEE);  Surgeon: Josue Hector, MD;  Location: [facility];  Service: Cardiovascular;  Laterality: N/A;  . TEE WITHOUT CARDIOVERSION N/A 09/01/2014   Procedure: TRANSESOPHAGEAL ECHOCARDIOGRAM (TEE);  Surgeon: [name], MD;  Location: [facility] ENDOSCOPY;  Service: Cardiovascular;  Laterality: N/A;  . TONSILLECTOMY  1954    There were no vitals filed for this visit.      Subjective Assessment - 07/23/16 1236    Subjective Pt still has a cold and not feeling well.  Walked in the community with SPC on uneven surface and had no balance issues.   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 Yes   Pain Score 8    Pain Location Shoulder   Pain Orientation Right   Pain Descriptors / Indicators Aching   Pain Type Chronic  pain   Pain Onset More than a month ago   Pain Frequency Intermittent                         [facility] Adult PT Treatment/Exercise - 07/23/16 0001      Ambulation/Gait   Ambulation/Gait Yes   Ambulation/Gait Assistance 4: Min guard   Ambulation/Gait Assistance Details working on balance with head turns and with using SPC   Ambulation Distance (Feet) 350 Feet  +200 outdoors (L AFO- Walk On donned)   Assistive device Straight cane;Prosthesis   Gait Pattern Step-through pattern;Decreased weight shift to right;Trunk flexed;Decreased trunk rotation   Ambulation Surface Level;Unlevel;Outdoor;Paved;Indoor   Stairs Yes   Stairs Assistance 5: Supervision   Stair Management Technique One rail Left;With cane   Number of Stairs 4  x2   Curb 4: Min assist;Other (comment)  with bilateral UE support   Curb Details (indicate cue type and reason) cues for sequencing     Exercises   Exercises Knee/Hip  see pt instruction, cues for technique and posture     Prosthetics   Prosthetic Care Comments  wearing 12-16hrs/day. Verbal cues on antiperspirant in morning prior to donning prosthesis. Pt using baby oil on patella to decrease friction.    Current prosthetic wear tolerance (days/week)  daily   Current prosthetic wear tolerance (#hours/day)  Pt reports wearing prosthesis most of awake hours (12-16hrs).    Residual limb condition  Reports scratches are better.   Education Provided Residual limb care;Prosthetic cleaning;Correct ply sock adjustment   Person(s) Educated Patient   Education Method Explanation   Education Method Verbalized understanding                PT Education - 07/23/16 1539    Education provided Yes   Education Details HEP for seated LE strengthening.   Person(s) Educated Patient   Methods Explanation;Demonstration;Handout;Verbal cues   Comprehension Verbalized understanding;Returned demonstration          PT Short Term Goals - 07/02/16 1252       PT SHORT TERM GOAL #1   Title Patient demonstrates proper donning & verbalizes proper cleaning of prosthesis. (Target Date: 07/05/2016)   Baseline Met; 07/02/16   Time 1   Period Months   Status Achieved     PT SHORT TERM GOAL #2   Title Patient demonstrates understanding of initial HEP.  (Target Date: 07/05/2016)   Baseline Met; 07/02/16   Time 1   Period Months   Status Achieved     PT SHORT TERM GOAL #3   Title Patient tolerates wear of prosthesis >10 hrs total per day without skin issues or limb pain.  (Target Date: 07/05/2016)   Baseline Met; 07/02/16.   Time 1   Period Months   Status Achieved     PT SHORT TERM GOAL #4   Title Patient ambulates 200' with RW & prosthesis with supervision.  (Target Date: 07/05/2016)   Baseline Met; 07/02/16  Time 1   Period Months   Status On-going     PT SHORT TERM GOAL #5   Title Patient negotiates ramps, curbs with RW & stairs with 2 rails with supervision.  (Target Date: 07/05/2016)   Baseline Met; 07/02/16   Time 1   Period Months   Status Achieved     PT SHORT TERM GOAL #6   Title Patient able to reach 10" and to floor with UE support on RW safely.  (Target Date: 07/05/2016)   Baseline Met; 07/02/16   Time 1   Period Months   Status Achieved           PT Long Term Goals - 06/13/16 1528      PT LONG TERM GOAL #1   Title Patient tolerates wear of prosthesis all awake hours without skin issues or limb tenderness to enable function throughout his day. (Target Date: 08/03/2016)   Time 2   Period Months   Status On-going     PT LONG TERM GOAL #2   Title Patient verbalizes & demonstrates proper prosthetic care & use to enable safe use of prosthesis. (Target Date: 08/03/2016)   Time 2   Period Months   Status On-going     PT LONG TERM GOAL #3   Title Patient ambulates with LRAD & prosthesis 500' including outside surfaces modified independent to enable community mobility. (Target Date: 08/03/2016)   Time 2    Period Months   Status On-going     PT LONG TERM GOAL #4   Title Patient negotiates ramps, curbs & stairs with LRAD & prosthesis to enable community access. (Target Date: 08/03/2016)   Time 2   Period Months   Status On-going     PT LONG TERM GOAL #5   Title Berg Balance >36/56 to indicate lower fall risk. (Target Date: 08/03/2016)   Time 2   Period Months   Status On-going     PT LONG TERM GOAL #6   Title Timed Up-Go with LRAD <27 seconds safely to indicate lower fall risk. (Target Date: 08/03/2016)   Time 2   Period Months   Status On-going               Plan - 07/23/16 1540    Clinical Impression Statement Updated HEP with seated LE strengthening.  Pt walked on outdoor paved surface with SPC; reported fatiguing quickly (about 150 ft); requiring supervision to min guard.   Rehab Potential Good   PT Frequency 2x / week   PT Duration Other (comment)  9 weeks (60 days)   PT Treatment/Interventions ADLs/Self Care Home Management;DME Instruction;Gait training;Stair training;Functional mobility training;Therapeutic activities;Balance training;Neuromuscular re-education;Patient/family education;Prosthetic Training   PT Next Visit Plan standing balance and gait with SPC; curb in parallel bars with decreased UE support.  LE strengthening/endurance.   Consulted and Agree with Plan of Care Patient      Patient will benefit from skilled therapeutic intervention in order to improve the following deficits and impairments:  Abnormal gait, Decreased activity tolerance, Decreased balance, Decreased endurance, Decreased knowledge of use of DME, Decreased mobility, Decreased strength, Postural dysfunction, Prosthetic Dependency  Visit Diagnosis: Other abnormalities of gait and mobility  Unsteadiness on feet  Muscle weakness (generalized)  Other symptoms and signs involving the musculoskeletal system     Problem List Patient Active Problem List   Diagnosis Date Noted  . Fluid  retention   . Labile blood glucose   . Toe erythema   . Hypoglycemia associated  with type 2 diabetes mellitus ([place])   . Hyponatremia   . Diarrhea 01/17/2016  . Amputation of right lower extremity below knee with complication ([place]) 10/28/1733  . AKI (acute kidney injury) ([place])   . Type 2 diabetes mellitus with peripheral neuropathy ([facility])   . Status post aortic valve replacement   . Chronic combined systolic and diastolic congestive heart failure ([place])   . ATN (acute tubular necrosis) ([place])   . Postoperative pain   . Acute blood loss anemia   . Leukocytosis   . Morbid obesity ([place])   . Status post below knee amputation of right lower extremity ([place])   . Insulin dependent diabetes mellitus ([place]) 01/09/2016  . Diabetic foot infection ([place]) 01/09/2016  . Sepsis, unspecified organism ([place]) 01/09/2016  . PAF (paroxysmal atrial fibrillation) ([place]) 04/22/2015  . Essential hypertension 04/22/2015  . Obesity 04/22/2015  . Dyspnea 04/20/2015  . Acute on chronic diastolic CHF (congestive heart failure) ([place]) 04/19/2015  . Cellulitis of leg 11/23/2014  . Diabetes ([place]) 09/13/2014  . Chronic anticoagulation 09/13/2014  . Atrial flutter, unspecified   . Paroxysmal atrial fibrillation ([facility])   . Perirectal abscess   . Other hemorrhoids   . Tenosynovitis of finger 05/05/2014  . Cellulitis and abscess of digit 05/05/2014  . Back pain   . H/O aortic valve replacement with tissue graft   . Hypertriglyceridemia   . Hypertension     Bjorn Loser, PTA  07/23/16, 3:47 PM [facility] [address] [place], [place], [address] Phone: [phone]   Fax:  [phone]  Name: Thomas Wright MRN: [id number] Date of Birth: November 10, 1945

## 2016-07-23 NOTE — Patient Instructions (Addendum)
HIP / KNEE: Flexion - Sitting    Raise knee to chest hold 5 sec. Keep back straight, knee bent. __10_ reps per set, _2__ sets per day, Copyright  VHI. All rights reserved.  Knee Extension (Active / Resistive ROM)    Extend knee so that lower leg is straight. Hold __5__ seconds while counting out loud. Repeat with other leg. Repeat __10_x2_ times. per day.  http://gt2.exer.us/617   Copyright  VHI. All rights reserved.  Adduction: Hip - Knees Together (Sitting)    Sit with towel roll between knees. Push knees together. Hold for _5__ seconds. Rest for _5__ seconds. Repeat _20__ times. Do _1-2__ times a day.  Copyright  VHI. All rights reserved.  ABDUCTION: Sitting - Resistance Band (Active)    Sit with feet flat. Lift right leg slightly and, against red resistance band, draw it out to side. Complete _2__ sets of 10___ repetitions. Perform _1-2__ sessions per day.  Copyright  VHI. All rights reserved.

## 2016-07-24 ENCOUNTER — Ambulatory Visit (INDEPENDENT_AMBULATORY_CARE_PROVIDER_SITE_OTHER): Payer: Self-pay | Admitting: Orthopedic Surgery

## 2016-07-25 ENCOUNTER — Ambulatory Visit: Payer: Commercial Managed Care - HMO | Admitting: Physical Therapy

## 2016-07-26 ENCOUNTER — Encounter
Payer: Commercial Managed Care - HMO | Attending: Physical Medicine & Rehabilitation | Admitting: Physical Medicine & Rehabilitation

## 2016-07-26 ENCOUNTER — Encounter: Payer: Self-pay | Admitting: Physical Medicine & Rehabilitation

## 2016-07-26 VITALS — BP 168/88 | HR 76 | Resp 14

## 2016-07-26 DIAGNOSIS — I5042 Chronic combined systolic (congestive) and diastolic (congestive) heart failure: Secondary | ICD-10-CM | POA: Diagnosis not present

## 2016-07-26 DIAGNOSIS — Z89511 Acquired absence of right leg below knee: Secondary | ICD-10-CM | POA: Insufficient documentation

## 2016-07-26 DIAGNOSIS — I351 Nonrheumatic aortic (valve) insufficiency: Secondary | ICD-10-CM | POA: Diagnosis not present

## 2016-07-26 DIAGNOSIS — E1142 Type 2 diabetes mellitus with diabetic polyneuropathy: Secondary | ICD-10-CM | POA: Diagnosis not present

## 2016-07-26 DIAGNOSIS — G8929 Other chronic pain: Secondary | ICD-10-CM | POA: Diagnosis not present

## 2016-07-26 DIAGNOSIS — Z952 Presence of prosthetic heart valve: Secondary | ICD-10-CM | POA: Diagnosis not present

## 2016-07-26 DIAGNOSIS — I110 Hypertensive heart disease with heart failure: Secondary | ICD-10-CM | POA: Insufficient documentation

## 2016-07-26 DIAGNOSIS — R269 Unspecified abnormalities of gait and mobility: Secondary | ICD-10-CM | POA: Insufficient documentation

## 2016-07-26 DIAGNOSIS — I480 Paroxysmal atrial fibrillation: Secondary | ICD-10-CM | POA: Diagnosis not present

## 2016-07-26 DIAGNOSIS — N289 Disorder of kidney and ureter, unspecified: Secondary | ICD-10-CM | POA: Insufficient documentation

## 2016-07-26 DIAGNOSIS — R52 Pain, unspecified: Secondary | ICD-10-CM | POA: Diagnosis not present

## 2016-07-26 DIAGNOSIS — Z09 Encounter for follow-up examination after completed treatment for conditions other than malignant neoplasm: Secondary | ICD-10-CM | POA: Diagnosis not present

## 2016-07-26 DIAGNOSIS — M25511 Pain in right shoulder: Secondary | ICD-10-CM

## 2016-07-26 NOTE — Progress Notes (Signed)
Subjective:    Patient ID: Thomas Wright, male    DOB: 04-27-1946, 70 y.o.   MRN: [id number]  HPI 70 year old right-handed male with history of hypertension, PAF, diabetes mellitus, aortic regurgitation status post valve replacement, and chronic diastolic and systolic congestive heart failure presents for follow up after right BKA.    Last clinic visit 04/19/16.  Since that time he denies falls.  Use rolling walker in community and cane in house.  He is currently in therapies and completes next week.  He sees Vasc in a few weeks. Denies phantom limb pain.  He states he injured his right shoulder years ago and has plans with Ortho for laparoscopy.  He plans to go back to work soon, which is mostly sedentary.  Pt has since obtain his prosthesis, which he is doing well with.   Pain Inventory Average Pain 6 Pain Right Now 1 My pain is sharp  In the last 24 hours, has pain interfered with the following? General activity 0 Relation with others 0 Enjoyment of life 0 What TIME of day is your pain at its worst? evening, night Sleep (in general) Good  Pain is worse with: unsure Pain improves with: rest and medication Relief from Meds: 7  Mobility walk with assistance use a walker how many minutes can you walk? 5 ability to climb steps?  yes do you drive?  yes use a wheelchair  Function disabled: date disabled . I need assistance with the following:  shopping  Neuro/Psych weakness tingling trouble walking  Prior Studies Any changes since last visit?  no  Physicians involved in your care Any changes since last visit?  no   Family History  Problem Relation Age of Onset  . Colon cancer Father   . Diabetes Father   . Liver disease Brother   . Heart murmur Child   . Congenital heart disease Other   . Diabetes Maternal Grandmother   . Heart disease Maternal Grandfather     A Fib   Social History   Social History  . Marital status: Divorced    Spouse name: N/A  .  Number of children: N/A  . Years of education: N/A   Occupational History  . retired     Social History Main Topics  . Smoking status: Never Smoker  . Smokeless tobacco: Never Used  . Alcohol use 0.0 oz/week     Comment: 04/19/2015 "might have a beer or 2, 2-3 times/yr"  . Drug use: No  . Sexual activity: No   Other Topics Concern  . None   Social History Narrative  . None   Past Surgical History:  Procedure Laterality Date  . AMPUTATION Right 07/29/2014   Procedure: AMPUTATION RIGHT LONG FINGER;  Surgeon: [name], MD;  Location: [place];  Service: Orthopedics;  Laterality: Right;  . AMPUTATION Right 01/13/2016   Procedure: RIGHT BELOW KNEE AMPUTATION;  Surgeon: Newt Minion, MD;  Location: [place];  Service: Orthopedics;  Laterality: Right;  . AORTIC VALVE REPLACEMENT  05/2007   with tissue graft  . APPLICATION OF WOUND VAC Right 01/13/2016   Procedure: APPLICATION OF WOUND VAC;  Surgeon: Newt Minion, MD;  Location: [place];  Service: Orthopedics;  Laterality: Right;  . CARDIAC CATHETERIZATION  "several"  . CARDIAC VALVE REPLACEMENT    . CARDIOVERSION N/A 07/23/2014   Procedure: CARDIOVERSION;  Surgeon: Josue Hector, MD;  Location: [facility] ENDOSCOPY;  Service: Cardiovascular;  Laterality: N/A;  . CARDIOVERSION N/A 09/01/2014  Procedure: CARDIOVERSION;  Surgeon: [name], MD;  Location[facility];  Service: Cardiovascular;  Laterality: N/A;  . I&D EXTREMITY Right 05/05/2014   Procedure: IRRIGATION AND DEBRIDEMENT EXTREMITY;  Surgeon: [name], MD;  Location: [place];  Service: Orthopedics;  Laterality: Right;  . TEE WITHOUT CARDIOVERSION N/A 07/23/2014   Procedure: TRANSESOPHAGEAL ECHOCARDIOGRAM (TEE);  Surgeon: Josue Hector, MD;  Location: [place];  Service: Cardiovascular;  Laterality: N/A;  . TEE WITHOUT CARDIOVERSION N/A 09/01/2014   Procedure: TRANSESOPHAGEAL ECHOCARDIOGRAM (TEE);  Surgeon: [name], MD;  Location: [facility] ENDOSCOPY;  Service:  Cardiovascular;  Laterality: N/A;  . TONSILLECTOMY  1954   Past Medical History:  Diagnosis Date  . Arthritis    .  Marland Kitchen Cardiomyopathy ([place])    EF originally 20%-now 45%, no CAD on cath  . Chronic combined systolic and diastolic CHF (congestive heart failure) ([place])    a. 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 Reported h/o cath in 2008 without significant CAD.  Marland Kitchen Depression    "a little right now" (04/19/2015)  . Hyperlipidemia   . Hypertension   . Hypertriglyceridemia   . PAF (paroxysmal atrial fibrillation) ([place])    a. Postop 2008. b. recurrent afib 08/2014 with LAA clot noted on TEE. EF was down again at 25%. He was anticoagulated with Eliquis and placed on Amiodarone.He ultimately underwent TEE-DCCV 08/2014.  . S/P aortic valve replacement with bioprosthetic valve    a. bioprosthetic AVR in 05/2007 in [place].  . Type II diabetes mellitus (HCC)    BP (!) 168/88 (BP Location: Right Arm, Patient Position: Sitting, Cuff Size: Large)   Pulse 76   Resp 14   SpO2 98%   Opioid Risk Score:   Fall Risk Score:  `1  Depression screen PHQ 2/9  Depression screen [facility] 2/9 05/18/2016 04/10/2016 02/16/2016 01/04/2016 11/18/2015  Decreased Interest 1 0 0 0 0  Down, Depressed, Hopeless 1 0 1 0 0  PHQ - 2 Score 2 0 1 0 0  Altered sleeping 0 - 0 - -  Tired, decreased energy 0 - 0 - -  Change in appetite 0 - 0 - -  Feeling bad or failure about yourself  1 - 1 - -  Trouble concentrating 0 - 0 - -  Moving slowly or fidgety/restless 0 - 0 - -  Suicidal thoughts 0 - 0 - -  PHQ-9 Score 3 - 2 - -  Difficult doing work/chores Not difficult at all - Somewhat difficult - -     Review of Systems  HENT: Negative.   Eyes: Negative.   Respiratory: Negative.   Cardiovascular: Negative.   Gastrointestinal: Negative.   Endocrine: Negative.   Genitourinary: Negative.   Musculoskeletal: Positive for arthralgias (Right shoulder) and  gait problem.  Skin: Negative.   Allergic/Immunologic: Negative.   Neurological: Positive for weakness.       Tingling  Hematological: Negative.   Psychiatric/Behavioral: Negative.   All other systems reviewed and are negative.     Objective:   Physical Exam Constitutional: He appears well-developed. Obese. NAD.  HENT: Normocephalic and atraumatic.   Eyes: EOM are normal. No discharge.  Cardiovascular: RRR. No murmurs   Respiratory: Effort normal and breath sounds normal. No respiratory distress. No wheezes or rales. GI: Soft. Bowel sounds are normal. He exhibits no distension.  Musculoskeletal: He exhibits. No tenderness, no edema.   Neurological: He is alert and oriented.  Motor: Bilateral upper extremity 5/5 proximal to distal Left lower extremity hip flexion, knee extension 5/5, ankle dorsi/plantar flexion 5/5 Right lower extremity hip flexion, Knee extension: 5/5  Skin: BKA intact, healed Psychiatric: He has a normal mood and affect. His behavior is normal    Assessment & Plan:  70 year old right-handed male with history of hypertension, PAF, diabetes mellitus, aortic regurgitation status post valve replacement, and chronic diastolic and systolic congestive heart failure presents for follow up after right BKA.    1.  S/p right below-knee amputation on 01/13/2016  Cont Prosthesis   Cont Therapies  Cont follow up with Vascular  2. Abnormality of gait  Cont rolling walker/cane  3. Right shoulder injury  Follow up with Ortho for ?laparscopy

## 2016-07-30 ENCOUNTER — Ambulatory Visit (INDEPENDENT_AMBULATORY_CARE_PROVIDER_SITE_OTHER): Payer: Self-pay | Admitting: Orthopedic Surgery

## 2016-07-30 ENCOUNTER — Ambulatory Visit: Payer: Commercial Managed Care - HMO | Admitting: Physical Therapy

## 2016-07-31 ENCOUNTER — Ambulatory Visit: Payer: Commercial Managed Care - HMO | Admitting: Physical Therapy

## 2016-08-01 ENCOUNTER — Encounter: Payer: Self-pay | Admitting: Physical Therapy

## 2016-08-01 ENCOUNTER — Ambulatory Visit: Payer: Commercial Managed Care - HMO | Admitting: Physical Therapy

## 2016-08-01 DIAGNOSIS — R2681 Unsteadiness on feet: Secondary | ICD-10-CM

## 2016-08-01 DIAGNOSIS — R29898 Other symptoms and signs involving the musculoskeletal system: Secondary | ICD-10-CM

## 2016-08-01 DIAGNOSIS — M6281 Muscle weakness (generalized): Secondary | ICD-10-CM | POA: Diagnosis not present

## 2016-08-01 DIAGNOSIS — R2689 Other abnormalities of gait and mobility: Secondary | ICD-10-CM | POA: Diagnosis not present

## 2016-08-01 NOTE — Therapy (Signed)
[facility] [address] [place], [place], [address] Phone: [phone]   Fax:  [phone]  Physical Therapy Treatment  Patient Details  Name: Thomas Wright MRN: [id number] Date of Birth: 10-09-1945 Referring Provider: [name], MD  Encounter Date: 08/01/2016      PT End of Session - 08/01/16 2220    Visit Number 12   Number of Visits 20   Date for PT Re-Evaluation 09/28/16   Authorization Type Medicare G-Code & Progress Note   PT Start Time 0932   PT Stop Time 1015   PT Time Calculation (min) 43 min   Equipment Utilized During Treatment Gait belt   Activity Tolerance Patient tolerated treatment well   Behavior During Therapy [facility] for tasks assessed/performed      Past Medical History:  Diagnosis Date  . Arthritis    .  Marland Kitchen Cardiomyopathy ([place])    EF originally 20%-now 45%, no CAD on cath  . Chronic combined systolic and diastolic CHF (congestive heart failure) ([place])    a. 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 Reported h/o cath in 2008 without significant CAD.  Marland Kitchen Depression    "a little right now" (04/19/2015)  . Hyperlipidemia   . Hypertension   . Hypertriglyceridemia   . PAF (paroxysmal atrial fibrillation) ([place])    a. Postop 2008. b. recurrent afib 08/2014 with LAA clot noted on TEE. EF was down again at 25%. He was anticoagulated with Eliquis and placed on Amiodarone.He ultimately underwent TEE-DCCV 08/2014.  . S/P aortic valve replacement with bioprosthetic valve    a. bioprosthetic AVR in 05/2007 in [place].  . Type II diabetes mellitus ([place])     Past Surgical History:  Procedure Laterality Date  . AMPUTATION Right 07/29/2014   Procedure: AMPUTATION RIGHT LONG FINGER;  Surgeon: [name], MD;  Location: [place];  Service: Orthopedics;  Laterality: Right;  . AMPUTATION Right 01/13/2016   Procedure: RIGHT BELOW KNEE  AMPUTATION;  Surgeon: Newt Minion, MD;  Location: [place];  Service: Orthopedics;  Laterality: Right;  . AORTIC VALVE REPLACEMENT  05/2007   with tissue graft  . APPLICATION OF WOUND VAC Right 01/13/2016   Procedure: APPLICATION OF WOUND VAC;  Surgeon: Newt Minion, MD;  Location: [place];  Service: Orthopedics;  Laterality: Right;  . CARDIAC CATHETERIZATION  "several"  . CARDIAC VALVE REPLACEMENT    . CARDIOVERSION N/A 07/23/2014   Procedure: CARDIOVERSION;  Surgeon: Josue Hector, MD;  Location: [place];  Service: Cardiovascular;  Laterality: N/A;  . CARDIOVERSION N/A 09/01/2014   Procedure: CARDIOVERSION;  Surgeon: [name], MD;  Location: [facility] ENDOSCOPY;  Service: Cardiovascular;  Laterality: N/A;  . I&D EXTREMITY Right 05/05/2014   Procedure: IRRIGATION AND DEBRIDEMENT EXTREMITY;  Surgeon: [name], MD;  Location: [place];  Service: Orthopedics;  Laterality: Right;  . TEE WITHOUT CARDIOVERSION N/A 07/23/2014   Procedure: TRANSESOPHAGEAL ECHOCARDIOGRAM (TEE);  Surgeon: Josue Hector, MD;  Location: [place];  Service: Cardiovascular;  Laterality: N/A;  . TEE WITHOUT CARDIOVERSION N/A 09/01/2014   Procedure: TRANSESOPHAGEAL ECHOCARDIOGRAM (TEE);  Surgeon: [name], MD;  Location[facility] ENDOSCOPY;  Service: Cardiovascular;  Laterality: N/A;  . TONSILLECTOMY  1954    There were no vitals filed for this visit.      Subjective Assessment - 08/01/16 0937    Subjective He is getting a new mattress so he is sending  hospital bed. No issues with prosthesis. His right knee is bothering him for last 2 days which may be how he slept.    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, walk in/out building), bowling, shot pool, fish, get around in community   Pain Score 3    Pain Location Knee   Pain Orientation Right   Pain Descriptors / Indicators Aching   Pain  Type Chronic pain   Pain Onset More than a month ago   Pain Frequency Intermittent   Aggravating Factors  sitting with knee bent   Pain Relieving Factors straightening leg to stretch knee   Multiple Pain Sites No            [facility] PT Assessment - 08/01/16 0930      Ambulation/Gait   Ambulation/Gait Yes   Ambulation/Gait Assistance 4: Min assist;6: Modified independent (Device/Increase time)  Modified Independent RW & MinA cane   Ambulation Distance (Feet) 400 Feet  400' with RW & 43' with cane   Assistive device Rolling walker;Straight cane;Prosthesis  cane with quad tip   Gait Pattern Step-through pattern;Decreased stance time - right;Decreased step length - left;Decreased stride length;Decreased hip/knee flexion - right;Right circumduction;Right hip hike;Trunk flexed   Ambulation Surface Indoor;Level   Gait velocity 1.82 ft/sec with RW   Stairs Yes   Stairs Assistance 5: Supervision   Stairs Assistance Details (indicate cue type and reason) PT demo & verbal cues on technique with single rail & folded RW similar to his home entrance   Stair Management Technique One rail Left;With walker;Step to pattern;Forwards   Number of Stairs 4  4 reps   Ramp 5: Supervision  RW & prosthesis   Curb 5: Supervision  RW & prosthesis     Berg Balance Test   Sit to Stand Able to stand using hands after several tries   Standing Unsupported Needs several tries to stand 30 seconds unsupported   Sitting with Back Unsupported but Feet Supported on Floor or Stool Able to sit safely and securely 2 minutes   Stand to Sit Sits independently, has uncontrolled descent   Transfers Able to transfer safely, definite need of hands   Standing Unsupported with Eyes Closed Needs help to keep from falling   Standing Ubsupported with Feet Together Needs help to attain position and unable to hold for 15 seconds   From Standing, Reach Forward with Outstretched Arm Reaches forward but needs supervision   From  Standing Position, Pick up Object from Floor Unable to try/needs assist to keep balance   From Standing Position, Turn to Look Behind Over each Shoulder Needs assist to keep from losing balance and falling   Turn 360 Degrees Needs assistance while turning   Standing Unsupported, Alternately Place Feet on Step/Stool Needs assistance to keep from falling or unable to try   Standing Unsupported, One Foot in [employer] balance while stepping or standing   Standing on One Leg Unable to try or needs assist to prevent fall   Total Score 12     Timed Up and Go Test   Normal TUG (seconds) 21.27  21.27sec w/ RW & 21.13sec w/ cane; 35.12sec with RW initial         Prosthetics Assessment - 08/01/16 0930      Prosthetics   Prosthetic Care Independent with Skin check;Residual limb care;Prosthetic cleaning;Proper wear schedule/adjustment;Proper weight-bearing schedule/adjustment   Prosthetic  Care Dependent with Care of non-amputated limb;Correct ply sock adjustment   Donning prosthesis  Modified independent (Device/Increase time)   Doffing prosthesis  Modified independent (Device/Increase time)   Current prosthetic wear tolerance (days/week)  daily   Current prosthetic wear tolerance (#hours/day)  Pt reports wearing prosthesis most of awake hours (12-16hrs).    Current prosthetic weight-bearing tolerance (hours/day)  Patient tolerates standing intermittently 30 minutes with RW support with no limb pain or discomfort   Edema none   Residual limb condition  2 small 39m superficial wounds near knee from scratching                    [facility] Adult PT Treatment/Exercise - 08/01/16 0930      Transfers   Transfers Sit to Stand;Stand to Sit   Sit to Stand 3: Mod assist;6: Modified independent (Device/Increase time);4: Min assist;With upper extremity assist;With armrests;From chair/3-in-1  ModA no armrests, MinA to stabilize, Mod Indep w/armrests RW   Sit to Stand Details (indicate cue type and  reason) Chairs with armrests: to RW for stabilization Modified independent & MinA if no UE support to stabilize; Chair without armrests to RW: ModA turning 45* to push off back & bottom of chair   Stand to Sit 4: Min assist;6: Modified independent (Device/Increase time);With upper extremity assist;With armrests;To chair/3-in-1   Stand to Sit Details chair with armrests from RW controlled modified independent. From cane uncontrolled descent; RW to chair without armrests: modified independent.                 PT Education - 08/01/16 0940    Education provided Yes   Education Details positioning in bed uses pillows to support LEs   Person(s) Educated Patient   Methods Explanation;Verbal cues;Demonstration   Comprehension Verbalized understanding;Verbal cues required          PT Short Term Goals - 08/01/16 2233      PT SHORT TERM GOAL #1   Title Patient demonstrates proper donning & verbalizes proper cleaning of prosthesis. (Target Date: 07/05/2016)   Baseline Met; 07/02/16   Time 1   Period Months   Status Achieved     PT SHORT TERM GOAL #2   Title Patient demonstrates understanding of initial HEP.  (Target Date: 07/05/2016)   Baseline Met; 07/02/16   Time 1   Period Months   Status Achieved     PT SHORT TERM GOAL #3   Title Patient tolerates wear of prosthesis >10 hrs total per day without skin issues or limb pain.  (Target Date: 07/05/2016)   Baseline Met; 07/02/16.   Time 1   Period Months   Status Achieved     PT SHORT TERM GOAL #4   Title Patient ambulates 200' with RW & prosthesis with supervision.  (Target Date: 07/05/2016)   Baseline Met; 07/02/16   Time 1   Period Months   Status Achieved     PT SHORT TERM GOAL #5   Title Patient negotiates ramps, curbs with RW & stairs with 2 rails with supervision.  (Target Date: 07/05/2016)   Baseline Met; 07/02/16   Time 1   Period Months   Status Achieved     Additional Short Term Goals   Additional Short Term  Goals Yes     PT SHORT TERM GOAL #6   Title Patient able to reach 10" and to floor with UE support on RW safely.  (Target Date: 07/05/2016)   Baseline Met; 07/02/16   Time 1  Period Months   Status Achieved     PT SHORT TERM GOAL #7   Title Patient ambulates with forearm crutches or RW outside surfaces 500' with supervision. TARGET DATE 08/31/2016   Time 4   Period Weeks   Status New     PT SHORT TERM GOAL #8   Title Patient negotiates ramps & curbs with forearm crutches and stairs with 1 rail / 1 crutch with minA. TARGET DATE 08/31/2016   Time 4   Period Weeks   Status New     PT SHORT TERM GOAL #9   TITLE Patient ambulates 68' around table with cane carrying cup of water with minA. TARGET DATE 08/31/2016   Time 4   Period Weeks   Status New           PT Long Term Goals - 08/01/16 2221      PT LONG TERM GOAL #1   Title Patient tolerates wear of prosthesis all awake hours without skin issues or limb tenderness to enable function throughout his day. (Target Date: 08/03/2016)  NEW TARGET DATE 09/28/2016   Baseline Partially MET 08/01/2016 pt has 2 superficial wounds 49m at knee from scratching   Time 2   Period Months   Status Partially Met     PT LONG TERM GOAL #2   Title Patient verbalizes & demonstrates proper prosthetic care & use to enable safe use of prosthesis. (Target Date: 08/03/2016) NEW TARGET DATE 09/28/2016   Baseline Partially MET 08/01/2016  Patient verbalizes proper cleaning, donning and skin checking. He requires cues for limb management & adjusting ply socks.    Time 2   Period Months   Status Partially Met     PT LONG TERM GOAL #3   Title Patient ambulates with LRAD & prosthesis 500' including outside surfaces modified independent to enable community mobility. (Target Date: 08/03/2016)  NEW TARGET DATE 09/28/2016   Baseline Progressing 08/01/2016 Patient ambulates 400' ambulating with RW on paved or hard surfaces safely but requires MinA on uneven  terrain.    Time 2   Period Months   Status On-going     PT LONG TERM GOAL #4   Title Patient negotiates ramps, curbs & stairs with LRAD & prosthesis to enable community access. (Target Date: 08/03/2016)  NEW TARGET DATE 09/28/2016   Baseline Progressing 08/01/2016 Ramps & curbs with RW with supervision and stairs with left rail / folded RW similar to his home set-up with minA.    Time 2   Period Months   Status On-going     PT LONG TERM GOAL #5   Title Berg Balance >/= 15/56 to indicate lower fall risk. (Target Date: 08/03/2016) NEW TARGET DATE 09/28/2016   Baseline 08/01/2016  Berg Balance improved from 10/56 to 12/56   Time 2   Period Months   Status Revised     Additional Long Term Goals   Additional Long Term Goals Yes     PT LONG TERM GOAL #6   Title Timed Up-Go with LRAD <27 seconds safely to indicate lower fall risk. (Target Date: 08/03/2016)    Baseline MET 08/01/2016   Time 2   Period Months   Status Achieved     PT LONG TERM GOAL #7   Title Timed Up-Go with straight cane safely <20 sec  TARGET DATE 09/28/2016   Time 8   Period Weeks   Status New     PT LONG TERM GOAL #8   Title Patient ambulates around  furniture with cane carrying cup of water safely. TARGET DATE 09/28/2016   Time 8   Period Weeks   Status New               Plan - 08/01/16 2239    Clinical Impression Statement Patient has made progress with prosthesis use at community level. However his balance continues to be impaired increasing his risk of falls with injury. He can benefit from additional PT to improve his balance and mobility to higher level with lower fall risk.    Rehab Potential Good   PT Frequency 1x / week   PT Duration 8 weeks   PT Treatment/Interventions ADLs/Self Care Home Management;DME Instruction;Gait training;Stair training;Functional mobility training;Therapeutic activities;Balance training;Neuromuscular re-education;Patient/family education;Prosthetic Training   PT Next  Visit Plan continue 1x/wk with standing balance and gait with SPC; curb in parallel bars with decreased UE support.  LE strengthening/endurance.   Consulted and Agree with Plan of Care Patient      Patient will benefit from skilled therapeutic intervention in order to improve the following deficits and impairments:  Abnormal gait, Decreased activity tolerance, Decreased balance, Decreased endurance, Decreased knowledge of use of DME, Decreased mobility, Decreased strength, Postural dysfunction, Prosthetic Dependency  Visit Diagnosis: Other abnormalities of gait and mobility  Unsteadiness on feet  Muscle weakness (generalized)  Other symptoms and signs involving the musculoskeletal system     Problem List Patient Active Problem List   Diagnosis Date Noted  . Fluid retention   . Labile blood glucose   . Toe erythema   . Hypoglycemia associated with type 2 diabetes mellitus ([place])   . Hyponatremia   . Diarrhea 01/17/2016  . Amputation of right lower extremity below knee with complication ([place]) 93/07/2161  . AKI (acute kidney injury) ([place])   . Type 2 diabetes mellitus with peripheral neuropathy (HCC)   . Status post aortic valve replacement   . Chronic combined systolic and diastolic congestive heart failure ([place])   . ATN (acute tubular necrosis) ([place])   . Postoperative pain   . Acute blood loss anemia   . Leukocytosis   . Morbid obesity ([place])   . Status post below knee amputation of right lower extremity ([place])   . Insulin dependent diabetes mellitus ([place]) 01/09/2016  . Diabetic foot infection ([place]) 01/09/2016  . Sepsis, unspecified organism ([place]) 01/09/2016  . PAF (paroxysmal atrial fibrillation) ([place]) 04/22/2015  . Essential hypertension 04/22/2015  . Obesity 04/22/2015  . Dyspnea 04/20/2015  . Acute on chronic diastolic CHF (congestive heart failure) ([place]) 04/19/2015  . Cellulitis of leg 11/23/2014  . Diabetes ([place]) 09/13/2014  . Chronic anticoagulation 09/13/2014  .  Atrial flutter, unspecified   . Paroxysmal atrial fibrillation (HCC)   . Perirectal abscess   . Other hemorrhoids   . Tenosynovitis of finger 05/05/2014  . Cellulitis and abscess of digit 05/05/2014  . Back pain   . H/O aortic valve replacement with tissue graft   . Hypertriglyceridemia   . Hypertension     Royce Sciara PT, DPT 08/01/2016, 10:45 PM  [facility] [address] [place], [place], [address] Phone: [phone]   Fax:  [phone]  Name: Thomas Wright MRN: [id number] Date of Birth: [birth year]

## 2016-08-04 DIAGNOSIS — Z89511 Acquired absence of right leg below knee: Secondary | ICD-10-CM | POA: Diagnosis not present

## 2016-08-04 DIAGNOSIS — I509 Heart failure, unspecified: Secondary | ICD-10-CM | POA: Diagnosis not present

## 2016-08-05 DIAGNOSIS — I509 Heart failure, unspecified: Secondary | ICD-10-CM | POA: Diagnosis not present

## 2016-08-05 DIAGNOSIS — Z89511 Acquired absence of right leg below knee: Secondary | ICD-10-CM | POA: Diagnosis not present

## 2016-08-13 ENCOUNTER — Ambulatory Visit: Payer: Commercial Managed Care - HMO | Admitting: Physical Therapy

## 2016-08-13 ENCOUNTER — Encounter: Payer: Self-pay | Admitting: Physical Therapy

## 2016-08-13 DIAGNOSIS — R29898 Other symptoms and signs involving the musculoskeletal system: Secondary | ICD-10-CM

## 2016-08-13 DIAGNOSIS — R2681 Unsteadiness on feet: Secondary | ICD-10-CM | POA: Diagnosis not present

## 2016-08-13 DIAGNOSIS — R2689 Other abnormalities of gait and mobility: Secondary | ICD-10-CM

## 2016-08-13 DIAGNOSIS — M6281 Muscle weakness (generalized): Secondary | ICD-10-CM

## 2016-08-13 NOTE — Therapy (Signed)
[facility] [address] [place], [place], [address] Phone: [phone]   Fax:  [phone]  Physical Therapy Treatment  Patient Details  Name: Thomas Wright MRN: [id number] Date of Birth: 08/03/1946 Referring Provider: [name], MD  Encounter Date: 08/13/2016      PT End of Session - 08/13/16 1123    Visit Number 13   Number of Visits 20   Date for PT Re-Evaluation 09/28/16   Authorization Type Medicare G-Code & Progress Note   PT Start Time 0845   PT Stop Time 0935   PT Time Calculation (min) 50 min   Equipment Utilized During Treatment Gait belt   Activity Tolerance Patient tolerated treatment well   Behavior During Therapy [facility] for tasks assessed/performed      Past Medical History:  Diagnosis Date  . Arthritis    .  Marland Kitchen Cardiomyopathy ([place])    EF originally 20%-now 45%, no CAD on cath  . Chronic combined systolic and diastolic CHF (congestive heart failure) ([place])    a. 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 Reported h/o cath in 2008 without significant CAD.  Marland Kitchen Depression    "a little right now" (04/19/2015)  . Hyperlipidemia   . Hypertension   . Hypertriglyceridemia   . PAF (paroxysmal atrial fibrillation) ([place])    a. Postop 2008. b. recurrent afib 08/2014 with LAA clot noted on TEE. EF was down again at 25%. He was anticoagulated with Eliquis and placed on Amiodarone.He ultimately underwent TEE-DCCV 08/2014.  . S/P aortic valve replacement with bioprosthetic valve    a. bioprosthetic AVR in 05/2007 in [place].  . Type II diabetes mellitus ([place])     Past Surgical History:  Procedure Laterality Date  . AMPUTATION Right 07/29/2014   Procedure: AMPUTATION RIGHT LONG FINGER;  Surgeon: [name], MD;  Location: [place];  Service: Orthopedics;  Laterality: Right;  . AMPUTATION Right 01/13/2016   Procedure: RIGHT BELOW KNEE  AMPUTATION;  Surgeon: Newt Minion, MD;  Location: [facility];  Service: Orthopedics;  Laterality: Right;  . AORTIC VALVE REPLACEMENT  05/2007   with tissue graft  . APPLICATION OF WOUND VAC Right 01/13/2016   Procedure: APPLICATION OF WOUND VAC;  Surgeon: Newt Minion, MD;  Location: [facility];  Service: Orthopedics;  Laterality: Right;  . CARDIAC CATHETERIZATION  "several"  . CARDIAC VALVE REPLACEMENT    . CARDIOVERSION N/A 07/23/2014   Procedure: CARDIOVERSION;  Surgeon: Josue Hector, MD;  Location: [place];  Service: Cardiovascular;  Laterality: N/A;  . CARDIOVERSION N/A 09/01/2014   Procedure: CARDIOVERSION;  Surgeon: [name], MD;  Location: [facility] ENDOSCOPY;  Service: Cardiovascular;  Laterality: N/A;  . I&D EXTREMITY Right 05/05/2014   Procedure: IRRIGATION AND DEBRIDEMENT EXTREMITY;  Surgeon: [name], MD;  Location: [facility];  Service: Orthopedics;  Laterality: Right;  . TEE WITHOUT CARDIOVERSION N/A 07/23/2014   Procedure: TRANSESOPHAGEAL ECHOCARDIOGRAM (TEE);  Surgeon: Josue Hector, MD;  Location: [facility];  Service: Cardiovascular;  Laterality: N/A;  . TEE WITHOUT CARDIOVERSION N/A 09/01/2014   Procedure: TRANSESOPHAGEAL ECHOCARDIOGRAM (TEE);  Surgeon: [name], MD;  Location: [facility] ENDOSCOPY;  Service: Cardiovascular;  Laterality: N/A;  . TONSILLECTOMY  1954    There were no vitals filed for this visit.      Subjective Assessment - 08/13/16 0848    Subjective He drove to [facility] for Thanksgiving with no issues. He  starts back to work this Friday.    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 Yes   Pain Score 5    Pain Location Shoulder   Pain Orientation Right   Pain Descriptors / Indicators Aching   Pain Type Chronic pain   Pain Onset More than a month ago   Pain Frequency Intermittent    Aggravating Factors  has spurs,    Pain Relieving Factors medications,    Multiple Pain Sites No     Therapeutic Exercise: Corner balance head turns feet apart: eyes open on floor intermittent touch, eyes closed with light touch on sides of chair and standing on foam with eyes open with light touch on sides of chair.  Sitting on 24" & 29" stool: sit to/from stand without UE support but needs support surface anteriorly for intermittent UE to stabilize, reaching right, left, leaning forward & backward for trunk support.  4" stool with counter & chair back support alternating lead limb: stepping up & over, side stepping over and straddle steps 10 reps each                             PT Education - 08/13/16 0845    Education provided Yes   Education Details HEP corner balance, step-up on 4" block at counter and sitting on 29" stool including sit to/from stand.    Person(s) Educated Patient   Methods Explanation;Demonstration;Tactile cues;Verbal cues;Handout   Comprehension Verbalized understanding;Verbal cues required;Returned demonstration;Tactile cues required;Need further instruction          PT Short Term Goals - 08/01/16 2233      PT SHORT TERM GOAL #1   Title Patient demonstrates proper donning & verbalizes proper cleaning of prosthesis. (Target Date: 07/05/2016)   Baseline Met; 07/02/16   Time 1   Period Months   Status Achieved     PT SHORT TERM GOAL #2   Title Patient demonstrates understanding of initial HEP.  (Target Date: 07/05/2016)   Baseline Met; 07/02/16   Time 1   Period Months   Status Achieved     PT SHORT TERM GOAL #3   Title Patient tolerates wear of prosthesis >10 hrs total per day without skin issues or limb pain.  (Target Date: 07/05/2016)   Baseline Met; 07/02/16.   Time 1   Period Months   Status Achieved     PT SHORT TERM GOAL #4   Title Patient ambulates 200' with RW & prosthesis with supervision.  (Target Date: 07/05/2016)    Baseline Met; 07/02/16   Time 1   Period Months   Status Achieved     PT SHORT TERM GOAL #5   Title Patient negotiates ramps, curbs with RW & stairs with 2 rails with supervision.  (Target Date: 07/05/2016)   Baseline Met; 07/02/16   Time 1   Period Months   Status Achieved     Additional Short Term Goals   Additional Short Term Goals Yes     PT SHORT TERM GOAL #6   Title Patient able to reach 10" and to floor with UE support on RW safely.  (Target Date: 07/05/2016)   Baseline Met; 07/02/16   Time 1   Period Months   Status Achieved     PT SHORT TERM GOAL #7  Title Patient ambulates with forearm crutches or RW outside surfaces 500' with supervision. TARGET DATE 08/31/2016   Time 4   Period Weeks   Status New     PT SHORT TERM GOAL #8   Title Patient negotiates ramps & curbs with forearm crutches and stairs with 1 rail / 1 crutch with minA. TARGET DATE 08/31/2016   Time 4   Period Weeks   Status New     PT SHORT TERM GOAL #9   TITLE Patient ambulates 63' around table with cane carrying cup of water with minA. TARGET DATE 08/31/2016   Time 4   Period Weeks   Status New           PT Long Term Goals - 08/01/16 2221      PT LONG TERM GOAL #1   Title Patient tolerates wear of prosthesis all awake hours without skin issues or limb tenderness to enable function throughout his day. (Target Date: 08/03/2016)  NEW TARGET DATE 09/28/2016   Baseline Partially MET 08/01/2016 pt has 2 superficial wounds 48m at knee from scratching   Time 2   Period Months   Status Partially Met     PT LONG TERM GOAL #2   Title Patient verbalizes & demonstrates proper prosthetic care & use to enable safe use of prosthesis. (Target Date: 08/03/2016) NEW TARGET DATE 09/28/2016   Baseline Partially MET 08/01/2016  Patient verbalizes proper cleaning, donning and skin checking. He requires cues for limb management & adjusting ply socks.    Time 2   Period Months   Status Partially Met     PT  LONG TERM GOAL #3   Title Patient ambulates with LRAD & prosthesis 500' including outside surfaces modified independent to enable community mobility. (Target Date: 08/03/2016)  NEW TARGET DATE 09/28/2016   Baseline Progressing 08/01/2016 Patient ambulates 400' ambulating with RW on paved or hard surfaces safely but requires MinA on uneven terrain.    Time 2   Period Months   Status On-going     PT LONG TERM GOAL #4   Title Patient negotiates ramps, curbs & stairs with LRAD & prosthesis to enable community access. (Target Date: 08/03/2016)  NEW TARGET DATE 09/28/2016   Baseline Progressing 08/01/2016 Ramps & curbs with RW with supervision and stairs with left rail / folded RW similar to his home set-up with minA.    Time 2   Period Months   Status On-going     PT LONG TERM GOAL #5   Title Berg Balance >/= 15/56 to indicate lower fall risk. (Target Date: 08/03/2016) NEW TARGET DATE 09/28/2016   Baseline 08/01/2016  Berg Balance improved from 10/56 to 12/56   Time 2   Period Months   Status Revised     Additional Long Term Goals   Additional Long Term Goals Yes     PT LONG TERM GOAL #6   Title Timed Up-Go with LRAD <27 seconds safely to indicate lower fall risk. (Target Date: 08/03/2016)    Baseline MET 08/01/2016   Time 2   Period Months   Status Achieved     PT LONG TERM GOAL #7   Title Timed Up-Go with straight cane safely <20 sec  TARGET DATE 09/28/2016   Time 8   Period Weeks   Status New     PT LONG TERM GOAL #8   Title Patient ambulates around furniture with cane carrying cup of water safely. TARGET DATE 09/28/2016   Time 8  Period Weeks   Status New               Plan - 08/13/16 1124    Clinical Impression Statement Patient appears to understand updated HEP structured to work on balance & strength.    Rehab Potential Good   PT Frequency 1x / week   PT Duration 8 weeks   PT Treatment/Interventions ADLs/Self Care Home Management;DME Instruction;Gait  training;Stair training;Functional mobility training;Therapeutic activities;Balance training;Neuromuscular re-education;Patient/family education;Prosthetic Training   PT Next Visit Plan continue 1x/wk with standing balance and gait with SPC; curb in parallel bars with decreased UE support.  LE strengthening/endurance.   Consulted and Agree with Plan of Care Patient      Patient will benefit from skilled therapeutic intervention in order to improve the following deficits and impairments:  Abnormal gait, Decreased activity tolerance, Decreased balance, Decreased endurance, Decreased knowledge of use of DME, Decreased mobility, Decreased strength, Postural dysfunction, Prosthetic Dependency  Visit Diagnosis: Other abnormalities of gait and mobility  Unsteadiness on feet  Muscle weakness (generalized)  Other symptoms and signs involving the musculoskeletal system     Problem List Patient Active Problem List   Diagnosis Date Noted  . Fluid retention   . Labile blood glucose   . Toe erythema   . Hypoglycemia associated with type 2 diabetes mellitus ([place])   . Hyponatremia   . Diarrhea 01/17/2016  . Amputation of right lower extremity below knee with complication ([place]) 35/68/6168  . AKI (acute kidney injury) ([place])   . Type 2 diabetes mellitus with peripheral neuropathy ([facility])   . Status post aortic valve replacement   . Chronic combined systolic and diastolic congestive heart failure ([place])   . ATN (acute tubular necrosis) ([place])   . Postoperative pain   . Acute blood loss anemia   . Leukocytosis   . Morbid obesity ([place])   . Status post below knee amputation of right lower extremity ([place])   . Insulin dependent diabetes mellitus ([place]) 01/09/2016  . Diabetic foot infection ([place]) 01/09/2016  . Sepsis, unspecified organism ([place]) 01/09/2016  . PAF (paroxysmal atrial fibrillation) ([place]) 04/22/2015  . Essential hypertension 04/22/2015  . Obesity 04/22/2015  . Dyspnea 04/20/2015  . Acute  on chronic diastolic CHF (congestive heart failure) ([place]) 04/19/2015  . Cellulitis of leg 11/23/2014  . Diabetes ([place]) 09/13/2014  . Chronic anticoagulation 09/13/2014  . Atrial flutter, unspecified   . Paroxysmal atrial fibrillation ([facility])   . Perirectal abscess   . Other hemorrhoids   . Tenosynovitis of finger 05/05/2014  . Cellulitis and abscess of digit 05/05/2014  . Back pain   . H/O aortic valve replacement with tissue graft   . Hypertriglyceridemia   . Hypertension     [name] PT, DPT 08/13/2016, 11:30 AM  [facility] [address] [place], [place], [address] Phone: [phone]   Fax:  [phone]  Name: Thomas Wright MRN: [id number] Date of Birth: August 31, 1946

## 2016-08-13 NOTE — Patient Instructions (Addendum)
Feet Apart, Head Motion - Eyes Open    Stand in corner with chair back in front of you.  With eyes open, feet apart, move head slowly: right/left, up/  Down, up-right/down-left, up-left/down-right.  Repeat __10__ times per direction. Do __1__ sessions per day.  Copyright  VHI. All rights reserved.  Feet Apart, Head Motion - Eyes Closed    Stand in corner with chair back in front of you.  Hold fingers to side of chair as light as possible With eyes closed, feet apart, move head slowly: right/left, up/  Down, up-right/down-left, up-left/down-right.  Repeat __10__ times per direction. Do __1__ sessions per day.  Copyright  VHI. All rights reserved.  Feet Apart (Compliant Surface) Head Motion - Eyes Open    Stand in corner with chair back in front of you.  Stand on foam or pillow. Hold fingers to side of chair as light as possible With eyes open, feet apart, move head slowly: right/left, up/  Down, up-right/down-left, up-left/down-right.  Repeat __10__ times per direction. Do __1__ sessions per day.  Copyright  VHI. All rights reserved.   http://plyo.exer.us/136   Copyright  VHI. All rights reserved.  Step-Down / Step-Up    Position 4" step between counter & chair back. Step up leading with left leg & then leading with right leg.  Repeat __10__ times per set.  http://orth.exer.us/684   Copyright  VHI. All rights reserved.  Step-Up: to Side    Step up to side. Lead stepping up with right leg & leading with left leg. Keep pelvis level and spine neutral. Do _10__ times, each leg,  http://ss.exer.us/178   Copyright  VHI. All rights reserved.

## 2016-08-20 ENCOUNTER — Encounter: Payer: Self-pay | Admitting: Physical Therapy

## 2016-08-20 ENCOUNTER — Ambulatory Visit: Payer: Commercial Managed Care - HMO | Attending: Orthopedic Surgery | Admitting: Physical Therapy

## 2016-08-20 DIAGNOSIS — R2681 Unsteadiness on feet: Secondary | ICD-10-CM | POA: Diagnosis not present

## 2016-08-20 DIAGNOSIS — R2689 Other abnormalities of gait and mobility: Secondary | ICD-10-CM | POA: Diagnosis not present

## 2016-08-20 DIAGNOSIS — M6281 Muscle weakness (generalized): Secondary | ICD-10-CM | POA: Diagnosis not present

## 2016-08-20 DIAGNOSIS — R29898 Other symptoms and signs involving the musculoskeletal system: Secondary | ICD-10-CM | POA: Diagnosis not present

## 2016-08-20 NOTE — Patient Instructions (Signed)
Feet Apart, Head Motion - Eyes Open    Stand in corner with chair back in front of you.  With eyes open, feet apart, move head slowly: right/left, up/  Down, up-right/down-left, up-left/down-right.  Repeat __10__ times per direction. Do __1__ sessions per day.  Copyright  VHI. All rights reserved.  Feet Apart, Head Motion - Eyes Closed    Stand in corner with chair back in front of you.  Hold fingers to side of chair as light as possible With eyes closed, feet apart, move head slowly: right/left, up/  Down, up-right/down-left, up-left/down-right.  Repeat __10__ times per direction. Do __1__ sessions per day.  Copyright  VHI. All rights reserved.  Feet Apart (Compliant Surface) Head Motion - Eyes Open    Stand in corner with chair back in front of you.  Stand on foam or pillow. Hold fingers to side of chair as light as possible With eyes open, feet apart, move head slowly: right/left, up/  Down, up-right/down-left, up-left/down-right.  Repeat __10__ times per direction. Do __1__ sessions per day.  Copyright  VHI. All rights reserved.   http://plyo.exer.us/136   Copyright  VHI. All rights reserved.  Step-Down / Step-Up    Position 4" step between counter & chair back. Step up leading with left leg & then leading with right leg.  Repeat __10__ times per set.  http://orth.exer.us/684   Copyright  VHI. All rights reserved.  Step-Up: to Side    Step up to side. Lead stepping up with right leg & leading with left leg. Keep pelvis level and spine neutral. Do _10__ times, each leg,  http://ss.exer.us/178   Copyright  VHI. All rights reserved.

## 2016-08-20 NOTE — Therapy (Signed)
[facility] [address] [place], [place], [address] Phone: [phone]   Fax:  [phone]  Physical Therapy Treatment  Patient Details  Name: Thomas Wright MRN: [id number] Date of Birth: Feb 12, 1946 Referring Provider: [name], MD  Encounter Date: 08/20/2016      PT End of Session - 08/20/16 0929    Visit Number 14   Number of Visits 20   Date for PT Re-Evaluation 09/28/16   Authorization Type Medicare G-Code & Progress Note   PT Start Time 0850   PT Stop Time 0930   PT Time Calculation (min) 40 min      Past Medical History:  Diagnosis Date  . Arthritis    .  Marland Kitchen Cardiomyopathy ([place])    EF originally 20%-now 45%, no CAD on cath  . Chronic combined systolic and diastolic CHF (congestive heart failure) ([place])    a. 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 Reported h/o cath in 2008 without significant CAD.  Marland Kitchen Depression    "a little right now" (04/19/2015)  . Hyperlipidemia   . Hypertension   . Hypertriglyceridemia   . PAF (paroxysmal atrial fibrillation) ([place])    a. Postop 2008. b. recurrent afib 08/2014 with LAA clot noted on TEE. EF was down again at 25%. He was anticoagulated with Eliquis and placed on Amiodarone.He ultimately underwent TEE-DCCV 08/2014.  . S/P aortic valve replacement with bioprosthetic valve    a. bioprosthetic AVR in 05/2007 in [place].  . Type II diabetes mellitus ([place])     Past Surgical History:  Procedure Laterality Date  . AMPUTATION Right 07/29/2014   Procedure: AMPUTATION RIGHT LONG FINGER;  Surgeon: [name], MD;  Location: [facility];  Service: Orthopedics;  Laterality: Right;  . AMPUTATION Right 01/13/2016   Procedure: RIGHT BELOW KNEE AMPUTATION;  Surgeon: Newt Minion, MD;  Location: [name];  Service: Orthopedics;  Laterality: Right;  . AORTIC VALVE REPLACEMENT  05/2007   with tissue graft  .  APPLICATION OF WOUND VAC Right 01/13/2016   Procedure: APPLICATION OF WOUND VAC;  Surgeon: Newt Minion, MD;  Location: [facility];  Service: Orthopedics;  Laterality: Right;  . CARDIAC CATHETERIZATION  "several"  . CARDIAC VALVE REPLACEMENT    . CARDIOVERSION N/A 07/23/2014   Procedure: CARDIOVERSION;  Surgeon: Josue Hector, MD;  Location: [facility];  Service: Cardiovascular;  Laterality: N/A;  . CARDIOVERSION N/A 09/01/2014   Procedure: CARDIOVERSION;  Surgeon: [name], MD;  Location: [facility] ENDOSCOPY;  Service: Cardiovascular;  Laterality: N/A;  . I&D EXTREMITY Right 05/05/2014   Procedure: IRRIGATION AND DEBRIDEMENT EXTREMITY;  Surgeon: [name], MD;  Location: [facility];  Service: Orthopedics;  Laterality: Right;  . TEE WITHOUT CARDIOVERSION N/A 07/23/2014   Procedure: TRANSESOPHAGEAL ECHOCARDIOGRAM (TEE);  Surgeon: Josue Hector, MD;  Location: [facility];  Service: Cardiovascular;  Laterality: N/A;  . TEE WITHOUT CARDIOVERSION N/A 09/01/2014   Procedure: TRANSESOPHAGEAL ECHOCARDIOGRAM (TEE);  Surgeon: [name], MD;  Location[facility] ENDOSCOPY;  Service: Cardiovascular;  Laterality: N/A;  . TONSILLECTOMY  1954    There were no vitals filed for this visit.      Subjective Assessment - 08/20/16 0851    Subjective Pt did not go back to work due to office issues.  He'll be notified when he'll go back. Pt has to reschedule appointment for shoulder.  Pt walked without cane for awhile indoors since  last visit.   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 Yes   Pain Score 5    Pain Location Shoulder   Pain Orientation Right   Pain Descriptors / Indicators Aching   Pain Type Chronic pain   Pain Onset More than a month ago   Pain Frequency Intermittent                         [facility] Adult PT  Treatment/Exercise - 08/20/16 0001      Ambulation/Gait   Ambulation/Gait Yes   Ambulation/Gait Assistance 4: Min guard   Ambulation/Gait Assistance Details Working on balance with head turns and changes in speed and direction and figure 8 turns   Ambulation Distance (Feet) 230 Feet  x2   Assistive device Straight cane   Gait Pattern Step-through pattern;Decreased stance time - right;Decreased step length - left;Decreased stride length;Decreased hip/knee flexion - right;Right circumduction;Right hip hike;Trunk flexed   Ambulation Surface Level;Indoor     Prosthetics   Prosthetic Care Comments  wearing 12-16hrs/day. Verbal cues on antiperspirant in morning prior to donning prosthesis. Pt using baby oil on patella to decrease friction.    Current prosthetic wear tolerance (days/week)  daily   Current prosthetic wear tolerance (#hours/day)  Pt reports wearing prosthesis most of awake hours (12-16hrs).    Current prosthetic weight-bearing tolerance (hours/day)  15  min with intermittent UE support performing balance HEP   Edema none             Balance Exercises - 08/20/16 1310      Balance Exercises: Standing   Other Standing Exercises Peformed balance HEP as written in handout for 08/13/16 with min cues, see pt instruction  below.           PT Education - 08/20/16 0858    Education provided Yes   Education Details Reviewed HEP given last session 08/13/16   Person(s) Educated Patient   Methods Explanation;Demonstration;Verbal cues;Handout   Comprehension Verbalized understanding;Returned demonstration          PT Short Term Goals - 08/01/16 2233      PT SHORT TERM GOAL #1   Title Patient demonstrates proper donning & verbalizes proper cleaning of prosthesis. (Target Date: 07/05/2016)   Baseline Met; 07/02/16   Time 1   Period Months   Status Achieved     PT SHORT TERM GOAL #2   Title Patient demonstrates understanding of initial HEP.  (Target Date: 07/05/2016)    Baseline Met; 07/02/16   Time 1   Period Months   Status Achieved     PT SHORT TERM GOAL #3   Title Patient tolerates wear of prosthesis >10 hrs total per day without skin issues or limb pain.  (Target Date: 07/05/2016)   Baseline Met; 07/02/16.   Time 1   Period Months   Status Achieved     PT SHORT TERM GOAL #4   Title Patient ambulates 200' with RW & prosthesis with supervision.  (Target Date: 07/05/2016)   Baseline Met; 07/02/16   Time 1   Period Months   Status Achieved     PT SHORT TERM GOAL #5   Title Patient negotiates ramps, curbs with RW & stairs with 2 rails with supervision.  (Target Date: 07/05/2016)   Baseline Met; 07/02/16   Time 1  Period Months   Status Achieved     Additional Short Term Goals   Additional Short Term Goals Yes     PT SHORT TERM GOAL #6   Title Patient able to reach 10" and to floor with UE support on RW safely.  (Target Date: 07/05/2016)   Baseline Met; 07/02/16   Time 1   Period Months   Status Achieved     PT SHORT TERM GOAL #7   Title Patient ambulates with forearm crutches or RW outside surfaces 500' with supervision. TARGET DATE 08/31/2016   Time 4   Period Weeks   Status New     PT SHORT TERM GOAL #8   Title Patient negotiates ramps & curbs with forearm crutches and stairs with 1 rail / 1 crutch with minA. TARGET DATE 08/31/2016   Time 4   Period Weeks   Status New     PT SHORT TERM GOAL #9   TITLE Patient ambulates 16' around table with cane carrying cup of water with minA. TARGET DATE 08/31/2016   Time 4   Period Weeks   Status New           PT Long Term Goals - 08/01/16 2221      PT LONG TERM GOAL #1   Title Patient tolerates wear of prosthesis all awake hours without skin issues or limb tenderness to enable function throughout his day. (Target Date: 08/03/2016)  NEW TARGET DATE 09/28/2016   Baseline Partially MET 08/01/2016 pt has 2 superficial wounds 24m at knee from scratching   Time 2   Period Months    Status Partially Met     PT LONG TERM GOAL #2   Title Patient verbalizes & demonstrates proper prosthetic care & use to enable safe use of prosthesis. (Target Date: 08/03/2016) NEW TARGET DATE 09/28/2016   Baseline Partially MET 08/01/2016  Patient verbalizes proper cleaning, donning and skin checking. He requires cues for limb management & adjusting ply socks.    Time 2   Period Months   Status Partially Met     PT LONG TERM GOAL #3   Title Patient ambulates with LRAD & prosthesis 500' including outside surfaces modified independent to enable community mobility. (Target Date: 08/03/2016)  NEW TARGET DATE 09/28/2016   Baseline Progressing 08/01/2016 Patient ambulates 400' ambulating with RW on paved or hard surfaces safely but requires MinA on uneven terrain.    Time 2   Period Months   Status On-going     PT LONG TERM GOAL #4   Title Patient negotiates ramps, curbs & stairs with LRAD & prosthesis to enable community access. (Target Date: 08/03/2016)  NEW TARGET DATE 09/28/2016   Baseline Progressing 08/01/2016 Ramps & curbs with RW with supervision and stairs with left rail / folded RW similar to his home set-up with minA.    Time 2   Period Months   Status On-going     PT LONG TERM GOAL #5   Title Berg Balance >/= 15/56 to indicate lower fall risk. (Target Date: 08/03/2016) NEW TARGET DATE 09/28/2016   Baseline 08/01/2016  Berg Balance improved from 10/56 to 12/56   Time 2   Period Months   Status Revised     Additional Long Term Goals   Additional Long Term Goals Yes     PT LONG TERM GOAL #6   Title Timed Up-Go with LRAD <27 seconds safely to indicate lower fall risk. (Target Date: 08/03/2016)    Baseline MET 08/01/2016  Time 2   Period Months   Status Achieved     PT LONG TERM GOAL #7   Title Timed Up-Go with straight cane safely <20 sec  TARGET DATE 09/28/2016   Time 8   Period Weeks   Status New     PT LONG TERM GOAL #8   Title Patient ambulates around furniture with  cane carrying cup of water safely. TARGET DATE 09/28/2016   Time 8   Period Weeks   Status New               Plan - 08/20/16 1311    Clinical Impression Statement Pt demostrated understanding of HEP given 08/13/16.  Pt continues to require minguard to supervision with dynamic gait on indoor surface with SPC.   Rehab Potential Good   PT Frequency 1x / week   PT Duration 8 weeks   PT Treatment/Interventions ADLs/Self Care Home Management;DME Instruction;Gait training;Stair training;Functional mobility training;Therapeutic activities;Balance training;Neuromuscular re-education;Patient/family education;Prosthetic Training   PT Next Visit Plan continue 1x/wk with standing balance and gait with SPC; curb in parallel bars with decreased UE support.  LE strengthening/endurance.   Consulted and Agree with Plan of Care Patient      Patient will benefit from skilled therapeutic intervention in order to improve the following deficits and impairments:  Abnormal gait, Decreased activity tolerance, Decreased balance, Decreased endurance, Decreased knowledge of use of DME, Decreased mobility, Decreased strength, Postural dysfunction, Prosthetic Dependency  Visit Diagnosis: Other abnormalities of gait and mobility  Unsteadiness on feet  Muscle weakness (generalized)  Other symptoms and signs involving the musculoskeletal system     Problem List Patient Active Problem List   Diagnosis Date Noted  . Fluid retention   . Labile blood glucose   . Toe erythema   . Hypoglycemia associated with type 2 diabetes mellitus ([place])   . Hyponatremia   . Diarrhea 01/17/2016  . Amputation of right lower extremity below knee with complication ([place]) 74/25/9563  . AKI (acute kidney injury) ([place])   . Type 2 diabetes mellitus with peripheral neuropathy ([facility])   . Status post aortic valve replacement   . Chronic combined systolic and diastolic congestive heart failure ([place])   . ATN (acute tubular necrosis)  ([place])   . Postoperative pain   . Acute blood loss anemia   . Leukocytosis   . Morbid obesity ([place])   . Status post below knee amputation of right lower extremity ([place])   . Insulin dependent diabetes mellitus ([place]) 01/09/2016  . Diabetic foot infection ([place]) 01/09/2016  . Sepsis, unspecified organism ([place]) 01/09/2016  . PAF (paroxysmal atrial fibrillation) ([place]) 04/22/2015  . Essential hypertension 04/22/2015  . Obesity 04/22/2015  . Dyspnea 04/20/2015  . Acute on chronic diastolic CHF (congestive heart failure) ([place]) 04/19/2015  . Cellulitis of leg 11/23/2014  . Diabetes ([place]) 09/13/2014  . Chronic anticoagulation 09/13/2014  . Atrial flutter, unspecified   . Paroxysmal atrial fibrillation ([facility])   . Perirectal abscess   . Other hemorrhoids   . Tenosynovitis of finger 05/05/2014  . Cellulitis and abscess of digit 05/05/2014  . Back pain   . H/O aortic valve replacement with tissue graft   . Hypertriglyceridemia   . Hypertension    Bjorn Loser, PTA  08/20/16, 1:16 PM [facility] [address] [place], [place], [address] Phone: [phone]   Fax:  [phone]  Name: Thomas Wright MRN: [id number] Date of Birth: 21-Nov-1945

## 2016-08-27 ENCOUNTER — Ambulatory Visit: Payer: Commercial Managed Care - HMO | Admitting: Physical Therapy

## 2016-09-03 ENCOUNTER — Ambulatory Visit: Payer: Commercial Managed Care - HMO | Admitting: Physical Therapy

## 2016-09-03 DIAGNOSIS — I509 Heart failure, unspecified: Secondary | ICD-10-CM | POA: Diagnosis not present

## 2016-09-03 DIAGNOSIS — Z89511 Acquired absence of right leg below knee: Secondary | ICD-10-CM | POA: Diagnosis not present

## 2016-09-04 DIAGNOSIS — I509 Heart failure, unspecified: Secondary | ICD-10-CM | POA: Diagnosis not present

## 2016-09-04 DIAGNOSIS — Z89511 Acquired absence of right leg below knee: Secondary | ICD-10-CM | POA: Diagnosis not present

## 2016-09-11 ENCOUNTER — Ambulatory Visit: Payer: Commercial Managed Care - HMO | Admitting: Physical Therapy

## 2016-09-18 ENCOUNTER — Ambulatory Visit: Payer: Commercial Managed Care - HMO | Attending: Orthopedic Surgery | Admitting: Physical Therapy

## 2016-09-24 ENCOUNTER — Ambulatory Visit: Payer: Commercial Managed Care - HMO | Admitting: Cardiology

## 2016-09-24 NOTE — Progress Notes (Deleted)
Cardiology Office Note   Date:  09/24/2016   ID:  Thomas Wright, DOB December 04, 1945, MRN [id number]  PCP:  Thomas Heck, MD  Cardiologist:   Thomas Furbish, MD       History of Present Illness: Thomas Wright is a 71 y.o. 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 Interestingly, his BNP was normal. No changes on chest CT scan to suggest amiodarone toxicity. He was down 3.4 L at discharge and pulmonary function studies demonstrated moderate to severe restriction consistent with fibrosis or interstitial inflammations. Pulmonary referral was made.  Amputation BKA. Dr. Sharol Wright, Small cut at home which then led to gangrene   I reviewed pulmonary note. There was significant exposure in the 60s and 70s to coal dust. 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 Autoimmune serologies were drawn. Sedimentation rate was normal at 11.  Previously he had battled with hypotension at home SBPs 50s-60s.  Lightheaded but no syncope, chest pain, palpitations, dyspnea.  He has increased his Lasix to 70m BID previously.  He called in and was advised to decrease Coreg to 6.229mBID.  He has only been taking this when SBP > 125 and DBP > 85 he takes Coreg.  He has only been taking Coreg twice daily because BPs have been mostly lower than this.    His weight has fluctuated.  He reports abdominal distention but denies PND or LE edema.  He has stable 2 pillow orthopnea.  He reports early satiety in past year.  He doubles his Lasix about twice weekly when he feels his fluid is up (when it is difficult to button his pants).    Has not been walking as much due to DM ulcer on bottom of foot.  No fever/chills or  systemic symptoms to suggest infection.  Recent MRI negative for osteo.  He is following with wound specialist for this.  He also called with minor blood streaking on toilet paper. Hemoglobin has ranged from 9.3 to 11.2.  Aortic valve was placed in 05/2007 with a 29 mm [place]oprosthetic [place]% on cardiac catheter[place] however current ejection fraction appears to be 45% from 12/24/11 with aortic root dilatation of 4.5 cm.  He has been on amiodarone since his surgery. Previously had stopped it in 2013. 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 His atrial fibrillation came with a finger infection exacerbated by uncontrolled diabetes. He has had history as well of diabetic ketoacidosis, perirectal abscess.   Past Medical History:  Diagnosis Date  . Arthritis    .  . Mar[place]ardiomyopathy ([facility]   EF originally 20%-now 45%, no CAD on cath  . Chronic combined systolic and diastolic CHF (congestive heart failure) ([facility]   a. 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 Reported h/o cath in 2008 without significant CAD.  . Marland Kitchenepression    "a little right now" (04/19/2015)  . Hyperlipidemia   . Hypertension   . Hypertriglyceridemia   . PAF (paroxysmal atrial fibrillation) ([facility]   a. Postop 2008.  b. recurrent afib 08/2014 with LAA clot noted on TEE. EF was down again at 25%. He was anticoagulated with Eliquis and placed on Amiodarone.He ultimately underwent TEE-DCCV 08/2014.  . S/P aortic valve replacement with bioprosthetic valve    a. bioprosthetic AVR in 05/2007 in [place].  . Type II diabetes mellitus ([place])     Past Surgical History:  Procedure Laterality Date  . AMPUTATION Right 07/29/2014   Procedure: AMPUTATION RIGHT LONG FINGER;  Surgeon:  Thomas Crumb, MD;  Location: [facility];  Service: Orthopedics;  Laterality: Right;  . AMPUTATION Right 01/13/2016   Procedure: RIGHT BELOW KNEE AMPUTATION;  Surgeon: Thomas Minion, MD;  Location: [facility];  Service: Orthopedics;  Laterality: Right;  . AORTIC VALVE REPLACEMENT  05/2007   with tissue graft  . APPLICATION OF WOUND VAC Right 01/13/2016   Procedure: APPLICATION OF WOUND VAC;  Surgeon: Thomas Minion, MD;  Location: [facility];  Service: Orthopedics;  Laterality: Right;  . CARDIAC CATHETERIZATION  "several"  . CARDIAC VALVE REPLACEMENT    . CARDIOVERSION N/A 07/23/2014   Procedure: CARDIOVERSION;  Surgeon: Thomas Hector, MD;  Location: [facility];  Service: Cardiovascular;  Laterality: N/A;  . CARDIOVERSION N/A 09/01/2014   Procedure: CARDIOVERSION;  Surgeon: Thomas Furbish, MD;  Location: [facility] ENDOSCOPY;  Service: Cardiovascular;  Laterality: N/A;  . I&D EXTREMITY Right 05/05/2014   Procedure: IRRIGATION AND DEBRIDEMENT EXTREMITY;  Surgeon: Thomas Crumb, MD;  Location: [place];  Service: Orthopedics;  Laterality: Right;  . TEE WITHOUT CARDIOVERSION N/A 07/23/2014   Procedure: TRANSESOPHAGEAL ECHOCARDIOGRAM (TEE);  Surgeon: Thomas Hector, MD;  Location: [place];  Service: Cardiovascular;  Laterality: N/A;  . TEE WITHOUT CARDIOVERSION N/A 09/01/2014   Procedure: TRANSESOPHAGEAL ECHOCARDIOGRAM (TEE);  Surgeon: Thomas Furbish, MD;  Location: [facility] ENDOSCOPY;  Service: Cardiovascular;  Laterality: N/A;  . TONSILLECTOMY  1954     Current Outpatient Prescriptions  Medication Sig Dispense Refill  . amiodarone (PACERONE) 100 MG tablet Take 1 tablet (100 mg total) by mouth daily. 90 tablet 3  . apixaban (ELIQUIS) 5 MG TABS tablet Take 1 tablet (5 mg total) by mouth 2 (two) times daily. 180 tablet 3  . atorvastatin (LIPITOR) 10 MG tablet Take 1 tablet (10 mg total) by mouth every morning. 90 tablet 3  . Blood Glucose Monitoring Suppl (ACCU-CHEK NANO SMARTVIEW) w/Device KIT USE TO CHECK BLOOD SUGAR FOUR  TIMES PER DAY (NEEDS APPOINTMENT) 1 kit 0  . carvedilol (COREG) 6.25 MG tablet Take 6.25 mg by mouth 2 (two) times daily with a meal.    . DHA-EPA-Coenzyme Q10-Vitamin E (CO Q-10 VITAMIN E FISH OIL PO) Take 1,000 mg by mouth 2 (two) times daily.    . furosemide (LASIX) 40 MG tablet Take 1 tablet (40 mg total) by mouth daily. 30 tablet 1  . glucose blood (ACCU-CHEK SMARTVIEW) test strip Use as instructed to check blood sugar 4 times per day dx code E11.9 400 each 1  . insulin aspart (NOVOLOG) 100 UNIT/ML injection Inject 3 Units into the skin 3 (three) times daily with meals. 10 mL 11  . Insulin Glargine (TOUJEO SOLOSTAR) 300 UNIT/ML SOPN Inject 28 Units into the skin daily. 45 mL 1  . Insulin Pen Needle 31G X 5 MM MISC 1 each by Does not apply route 4 (four) times daily -  before meals and at bedtime. 300 each 0  . Insulin Syringe-Needle U-100 (INSULIN SYRINGE .3CC/31GX5/16") 31G X 5/16" 0.3 ML MISC 1 each by Does not apply route 4 (  four) times daily -  before meals and at bedtime. 300 each 0  . lisinopril (PRINIVIL,ZESTRIL) 10 MG tablet Take 1 tablet (10 mg total) by mouth at bedtime. 30 tablet 1  . Multiple Vitamin (MULTIVITAMIN) tablet Take 1 tablet by mouth daily.    . sildenafil (VIAGRA) 100 MG tablet Take 1 tablet (100 mg total) by mouth daily as needed for erectile dysfunction (1 hr prior to sexual activity). 6 tablet 12   No current facility-administered medications for this visit.     Allergies:   Amoxicill-clarithro-omeprazole    Social History:  The patient  reports that he has never smoked. He has never used smokeless tobacco. He reports that he drinks alcohol. He reports that he does not use drugs.   Family History:  The patient's family history includes Colon cancer in his father; Congenital heart disease in his other; Diabetes in his father and maternal grandmother; Heart disease in his maternal grandfather; Heart murmur in his child; Liver disease in his brother.    ROS:   Please see the history of present illness.   Otherwise, review of systems are positive for none.   All other systems are reviewed and negative.    PHYSICAL EXAM: VS:  There were no vitals taken for this visit. , BMI There is no height or weight on file to calculate BMI. GEN: Well nourished, well developed, in no acute distress HEENT: normal Neck: no JVD, carotid bruits, or masses Cardiac: RRR; no murmurs, rubs, or gallops,no edema  Respiratory:  clear to auscultation bilaterally, normal work of breathing GI: soft, nontender, mild distended, + BS MS: no deformity or atrophy Skin: warm and dry, no rash Neuro:  Grossly intact Psych: euthymic mood, full affect   EKG:  EKG is not ordered today.    Recent Labs: 01/12/2016: Magnesium 1.7 02/29/2016: TSH 2.76 03/01/2016: ALT 14; BUN 25; Creatinine, Ser 1.30; Hemoglobin 12.6; Platelets 226; Potassium 3.6; Sodium 142    Lipid Panel    Component Value Date/Time   CHOL 127 05/24/2015 0848   TRIG 123.0 05/24/2015 0848   HDL 38.70 (L) 05/24/2015 0848   CHOLHDL 3 05/24/2015 0848   VLDL 24.6 05/24/2015 0848   LDLCALC 64 05/24/2015 0848      Wt Readings from Last 3 Encounters:  05/07/16 215 lb (97.5 kg)  03/01/16 215 lb (97.5 kg)  02/03/16 227 lb 1.2 oz (103 kg)      Other studies Reviewed: Additional studies/ records that were reviewed today include: labs, radiology, office notes. Review of the above records demonstrates: as above   ECHO 07/22/14 - EF 25-30% (setting of AFIB RVR)  ECHO 12/08/13: - 45% to 50%.  - Aortic valve: A bioprosthesis was present. Trivial regurgitation. - Aortic root: The aortic root was moderately dilated. - Mitral valve: Calcified annulus. - Left atrium: The atrium was moderately dilated. Impressions:  ECHO 04/19/15: - Left ventricle: The cavity size was normal. Systolic function was  normal. The estimated ejection fraction was in the range of 50%  to 55%. Wall motion was normal; there were no  regional wall  motion abnormalities. Doppler parameters are consistent with  abnormal left ventricular relaxation (grade 1 diastolic  dysfunction). Doppler parameters are consistent with elevated  ventricular end-diastolic filling pressure. - Ventricular septum: Septal motion showed paradox. - Aortic valve: Bioprosthetic valve sits well in the aortic  position. There is mild central aortic regurgitation and no  paravalvular leak. Normal transaortic gradients. Transvalvular  velocity was within the normal range.  There was no stenosis.  There was mild regurgitation. Valve area (VTI): 3.58 cm^2. Valve  area (Vmax): 3.57 cm^2. Valve area (Vmean): 3.24 cm^2. - Mitral valve: Calcified annulus. Mildly thickened leaflets .  There was mild regurgitation. Valve area by continuity equation  (using LVOT flow): 4 cm^2. - Left atrium: The atrium was mildly dilated. - Right ventricle: The cavity size was mildly dilated. Wall  thickness was normal. - Right atrium: The atrium was mildly dilated. - Pulmonary arteries: Systolic pressure was within the normal  range.  Impressions:  - There is no significant difference when compared tio the prior  study from 11/22/2014.  CTA 12/14/13 -  Mild dilatation of the proximal aorta at the sinuses of Valsalva, measuring 4.2 cm in estimated diameter. The rest of the thoracic aorta is of normal caliber. No dissection is identified. Scattered calcified plaque is identified in the distribution of the LAD.   Nuclear stress test 11/2012-no ischemia, ejection fraction 42%  ASSESSMENT AND PLAN:  1.  Chronic diastolic heart failure-No coronary artery disease on catheterization. Ejection fraction on 04/2015 echocardiogram was now low normal 50%. Previously in the setting of atrial fibrillation with rapid ventricular response, ejection fraction was as low as 20-25%. interestingly, his dyspnea did not change all that much in the hospital after diuresing 3-4  L. His BNP on admission was actually normal. Weight gain is the only indicator of potential excess fluid. However, he has no other S&S of HF.  We will continue his Lasix. He is unsure if hypotension occurred with increased Lasix so will continue with daily Lasix but he can increase to BID prn.  Continue lower dose of Coreg 6.2m daily since he feels better with this change.   2. Paroxysmal atrial fibrillation, nonvalvular- In NSR. Decrease amiodarone from 2018mdaily to 100 daily. Last episode of atrial fibrillation occurred in 2015 in the setting of finger infection. At that time, cardioversion was delayed because of possible left atrial appendage thrombus. Back in 2013, his amiodarone was stopped because of several years of normal sinus rhythm detected postoperatively, bioprosthetic aortic valve replacement. Pulmonary did not feel as though his CT findings or lab work (ESR normal) were indicative of amiodarone toxicity causing his dyspnea. We will continue. He does well in normal rhythm. LFT, normal. Checking TSH. I asked him when Dr. BaDrema Dallashecks blood work to check LFT and TSH Wright amiodarone.  3. Obesity-continue to encourage weight loss.   4. 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 Due in 04/2016  5. 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  6. Difficult to control diabetes-has had perirectal abscess, foot ulcer, seen endocrinology.  7. Bioprosthetic aortic valve-replaced in 2009.   8. Chronic anticoagulation-Eliquis 5 mg twice a day. Wright his hemoglobin of 9.3, we will discontinue his aspirin 8197mContinue with Eliquis only.       Current medicines are reviewed at length with the patient today.  The patient does not have concerns regarding medicines.  The following changes have been made:  Decrease amiodarone  Labs/ tests ordered today include:   No orders of the defined types were placed in this encounter.    Disposition:    FU with [facility] in 6 months  Signed, MarCandee FurbishD  09/24/2016 6:32 AM    [facility] [address]  [id number]: [phone]: [phone]

## 2016-09-26 ENCOUNTER — Encounter: Payer: Self-pay | Admitting: Cardiology

## 2016-09-26 ENCOUNTER — Ambulatory Visit (INDEPENDENT_AMBULATORY_CARE_PROVIDER_SITE_OTHER): Payer: Commercial Managed Care - HMO | Admitting: Cardiology

## 2016-09-26 VITALS — BP 120/84 | HR 84 | Ht 72.0 in | Wt 240.0 lb

## 2016-09-26 DIAGNOSIS — I5032 Chronic diastolic (congestive) heart failure: Secondary | ICD-10-CM | POA: Diagnosis not present

## 2016-09-26 DIAGNOSIS — Z954 Presence of other heart-valve replacement: Secondary | ICD-10-CM

## 2016-09-26 DIAGNOSIS — I480 Paroxysmal atrial fibrillation: Secondary | ICD-10-CM | POA: Diagnosis not present

## 2016-09-26 DIAGNOSIS — Z79899 Other long term (current) drug therapy: Secondary | ICD-10-CM

## 2016-09-26 NOTE — Patient Instructions (Signed)
Medication Instructions:  The current medical regimen is effective;  continue present plan and medications.  Labwork: Please have blood work today (CBC, TSH, CMP and Free T4)  Follow-Up: Follow up in 6 months with Dr. Marlou Porch.  You will receive a letter in the mail 2 months before you are due.  Please call us when you receive this letter to schedule your follow up appointment.  If you need a refill on your cardiac medications before your next appointment, please call your pharmacy.  Thank you for choosing [facility]!!

## 2016-09-26 NOTE — Progress Notes (Signed)
Cardiology Office Note   Date:  09/26/2016   ID:  Thomas Wright, DOB Aug 15, 1946, MRN [id number]  PCP:  Thomas Heck, MD  Cardiologist:   Thomas Furbish, MD       History of Present Illness: Thomas Wright is a 71 y.o. 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 Interestingly, his BNP was normal. No changes on chest CT scan to suggest amiodarone toxicity. He was down 3.4 L at discharge and pulmonary function studies demonstrated moderate to severe restriction consistent with fibrosis or interstitial inflammations. Pulmonary referral was made.  Amputation BKA. Dr. Sharol Wright, Small cut at home which then led to gangrene   I reviewed pulmonary note. There was significant exposure in the 60s and 70s to coal dust. 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 Autoimmune serologies were drawn. Sedimentation rate was normal at 11.  Previously he had battled with hypotension at home SBPs 50s-60s.  Lightheaded but no syncope, chest pain, palpitations, dyspnea.   He called in and was advised to decrease Coreg to 6.35m BID.  He has only been taking this when SBP > 125 and DBP > 85 he takes Coreg.  He has only been taking Coreg twice daily because BPs have been mostly lower than this.    His weight has fluctuated.  He reports abdominal distention but denies PND or LE edema.  He has stable 2 pillow orthopnea.  He reports early satiety in past year.  He doubles his Lasix about twice weekly when he feels his fluid is up (when it is difficult to button his pants).     Aortic valve was placed in 05/2007 with a 29 mm bioprosthetic Edwards valve. [place] his ejection  fraction was 30% on cardiac catheterization however current ejection fraction appears to be 45% from 12/24/11 with aortic root dilatation of 4.5 cm.  He has been on amiodarone since his surgery. Previously had stopped it in 2013. 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 His atrial fibrillation came with a finger infection exacerbated by uncontrolled diabetes. He has had history as well of diabetic ketoacidosis, perirectal abscess.  09/26/16  - no strength, moderate. Fatigued. Weak[facility] not [facility] him. Felt a bit depressed during the holidays. No significant chest pain or shortness of breath. Right below-knee amputation, getting around. Last step at home is a half an inch higher and is somewhat of a challenge for him. [name] going to set him up with Silver sneakers.   Past Medical History:  Diagnosis Date  . Arthritis    .  .Marland KitchenCardiomyopathy ([place]    EF originally 20%-now 45%, no CAD on cath  . Chronic combined systolic and diastolic CHF (congestive heart failure) ([place]    a. 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 Reported h/o cath in 2008 without significant CAD.  .Marland KitchenDepression    "a little right now" (04/19/2015)  . Hyperlipidemia   . Hypertension   . Hypertriglyceridemia   . PAF (paroxysmal atrial fibrillation) ([place]    a. Postop  2008. b. recurrent afib 08/2014 with LAA clot noted on TEE. EF was down again at 25%. He was anticoagulated with Eliquis and placed on Amiodarone.He ultimately underwent TEE-DCCV 08/2014.  . S/P aortic valve replacement with bioprosthetic valve    a. bioprosthetic AVR in 05/2007 in [place].  . Type II diabetes mellitus ([place])     Past Surgical History:  Procedure Laterality Date  . AMPUTATION Right 07/29/2014   Procedure: AMPUTATION RIGHT LONG  FINGER;  Surgeon: Thomas Crumb, MD;  Location: [place];  Service: Orthopedics;  Laterality: Right;  . AMPUTATION Right 01/13/2016   Procedure: RIGHT BELOW KNEE AMPUTATION;  Surgeon: Thomas Minion, MD;  Location: [place];  Service: Orthopedics;  Laterality: Right;  . AORTIC VALVE REPLACEMENT  05/2007   with tissue graft  . APPLICATION OF WOUND VAC Right 01/13/2016   Procedure: APPLICATION OF WOUND VAC;  Surgeon: Thomas Minion, MD;  Location: [place];  Service: Orthopedics;  Laterality: Right;  . CARDIAC CATHETERIZATION  "several"  . CARDIAC VALVE REPLACEMENT    . CARDIOVERSION N/A 07/23/2014   Procedure: CARDIOVERSION;  Surgeon: Thomas Hector, MD;  Location: [place];  Service: Cardiovascular;  Laterality: N/A;  . CARDIOVERSION N/A 09/01/2014   Procedure: CARDIOVERSION;  Surgeon: Thomas Furbish, MD;  [facility] END[facility]COPY;  Service: Cardiovascular;  Laterality: N/A;  . I&D EXTREMITY Right 05/05/2014   Procedure: IRRIGATION AND DEBRIDEMENT EXTREMITY;  Surgeon: Thomas Crumb, MD;  Location: [place];  Service: Orthopedics;  Laterality: Right;  . TEE WITHOUT CARDIOVERSION N/A 07/23/2014   Procedure: TRANSESOPHAGEAL ECHOCARDIOGRAM (TEE);  Surgeon: Thomas Hector, MD;  Location: [place];  Service: Cardiovascular;  Laterality: N/A;  . TEE WITHOUT CARDIOVERSION N/A 09/01/2014   Procedure: TRANSESOPHAGEAL ECHOCARDIOGRAM (TEE);  Surgeon: Thomas Furbish, MD;  Location: [facility] ENDOSCOPY;  Service: Cardiovascular;  Laterality: N/A;  . TONSILLECTOMY  1954     Current Outpatient Prescriptions  Medication Sig Dispense Refill  . amiodarone (PACERONE) 100 MG tablet Take 1 tablet (100 mg total) by mouth daily. 90 tablet 3  . apixaban (ELIQUIS) 5 MG TABS tablet Take 1 tablet (5 mg total) by mouth 2 (two) times daily. 180 tablet 3  . atorvastatin (LIPITOR) 10 MG tablet Take 1 tablet (10 mg total) by mouth every morning. 90 tablet 3  . Blood Glucose Monitoring Suppl (ACCU-CHEK NANO SMARTVIEW) w/Device KIT USE TO  CHECK BLOOD SUGAR FOUR TIMES PER DAY (NEEDS APPOINTMENT) 1 kit 0  . carvedilol (COREG) 6.25 MG tablet Take 6.25 mg by mouth 2 (two) times daily with a meal.    . DHA-EPA-Coenzyme Q10-Vitamin E (CO Q-10 VITAMIN E FISH OIL PO) Take 1,000 mg by mouth 2 (two) times daily.    . furosemide (LASIX) 40 MG tablet Take 1 tablet (40 mg total) by mouth daily. 30 tablet 1  . glucose blood (ACCU-CHEK SMARTVIEW) test strip Use as instructed to check blood sugar 4 times per day dx code E11.9 400 each 1  . insulin aspart (NOVOLOG) 100 UNIT/ML injection Inject 3 Units into the skin 3 (three) times daily with meals. 10 mL 11  . Insulin Glargine (TOUJEO SOLOSTAR) 300 UNIT/ML SOPN Inject 28 Units into the skin daily. 45 mL 1  . Insulin Pen Needle 31G X 5 MM MISC 1 each by Does not apply route 4 (four) times daily -  before meals and at bedtime. 300 each 0  . Insulin Syringe-Needle U-100 (INSULIN SYRINGE .3CC/31GX5/16") 31G X 5/16" 0.3 ML MISC 1 each by Does not apply route  4 (four) times daily -  before meals and at bedtime. 300 each 0  . lisinopril (PRINIVIL,ZESTRIL) 10 MG tablet Take 1 tablet (10 mg total) by mouth at bedtime. 30 tablet 1  . Multiple Vitamin (MULTIVITAMIN) tablet Take 1 tablet by mouth daily.    . sildenafil (VIAGRA) 100 MG tablet Take 1 tablet (100 mg total) by mouth daily as needed for erectile dysfunction (1 hr prior to sexual activity). 6 tablet 12   No current facility-administered medications for this visit.     Allergies:   Amoxicill-clarithro-omeprazole    Social History:  The patient  reports that he has never smoked. He has never used smokeless tobacco. He reports that he drinks alcohol. He reports that he does not use drugs.   Family History:  The patient's family history includes Colon cancer in his father; Congenital heart disease in his other; Diabetes in his father and maternal grandmother; Heart disease in his maternal grandfather; Heart murmur in his child; Liver disease in his  brother.    ROS:  Please see the history of present illness.   Otherwise, review of systems are positive for none.   All other systems are reviewed and negative.    PHYSICAL EXAM: VS:  BP 120/84 (BP Location: Right Arm, Patient Position: Sitting, Cuff Size: Large)   Pulse 84   Ht 6' (1.829 m)   Wt 240 lb (108.9 kg)   BMI 32.55 kg/m  , BMI Body mass index is 32.55 kg/m. GEN: Well nourished, well developed, in no acute distress  HEENT: normal  Neck: no JVD, carotid bruits, or masses Cardiac: RRR; no murmurs, rubs, or gallops,no edema  Respiratory:  clear to auscultation bilaterally, normal work of breathing GI: soft, nontender, mild distended, + BS MS: no deformity or atrophy, RIGHT BKA Skin: warm and dry, no rash Neuro:  Grossly intact Psych: euthymic mood, full affect   EKG:  EKG is not ordered today.    Recent Labs: 01/12/2016: Magnesium 1.7 02/29/2016: TSH 2.76 03/01/2016: ALT 14; BUN 25; Creatinine, Ser 1.30; Hemoglobin 12.6; Platelets 226; Potassium 3.6; Sodium 142    Lipid Panel    Component Value Date/Time   CHOL 127 05/24/2015 0848   TRIG 123.0 05/24/2015 0848   HDL 38.70 (L) 05/24/2015 0848   CHOLHDL 3 05/24/2015 0848   VLDL 24.6 05/24/2015 0848   LDLCALC 64 05/24/2015 0848      Wt Readings from Last 3 Encounters:  09/26/16 240 lb (108.9 kg)  05/07/16 215 lb (97.5 kg)  03/01/16 215 lb (97.5 kg)      Other studies Reviewed: Additional studies/ records that were reviewed today include: labs, radiology, office notes. Review of the above records demonstrates: as above   ECHO 07/22/14 - EF 25-30% (setting of AFIB RVR)  ECHO 12/08/13: - 45% to 50%.  - Aortic valve: A bioprosthesis was present. Trivial regurgitation. - Aortic root: The aortic root was moderately dilated. - Mitral valve: Calcified annulus. - Left atrium: The atrium was moderately dilated. Impressions:  ECHO 04/19/15: - Left ventricle: The cavity size was normal. Systolic function  was  normal. The estimated ejection fraction was in the range of 50%  to 55%. Wall motion was normal; there were no regional wall  motion abnormalities. Doppler parameters are consistent with  abnormal left ventricular relaxation (grade 1 diastolic  dysfunction). Doppler parameters are consistent with elevated  ventricular end-diastolic filling pressure. - Ventricular septum: Septal motion showed paradox. - Aortic valve: Bioprosthetic valve sits well in the  aortic  position. There is mild central aortic regurgitation and no  paravalvular leak. Normal transaortic gradients. Transvalvular  velocity was within the normal range. There was no stenosis.  There was mild regurgitation. Valve area (VTI): 3.58 cm^2. Valve  area (Vmax): 3.57 cm^2. Valve area (Vmean): 3.24 cm^2. - Mitral valve: Calcified annulus. Mildly thickened leaflets .  There was mild regurgitation. Valve area by continuity equation  (using LVOT flow): 4 cm^2. - Left atrium: The atrium was mildly dilated. - Right ventricle: The cavity size was mildly dilated. Wall  thickness was normal. - Right atrium: The atrium was mildly dilated. - Pulmonary arteries: Systolic pressure was within the normal  range.  Impressions:  - There is no significant difference when compared tio the prior  study from 11/22/2014.  CTA 12/14/13 -  Mild dilatation of the proximal aorta at the sinuses of Valsalva, measuring 4.2 cm in estimated diameter. The rest of the thoracic aorta is of normal caliber. No dissection is identified. Scattered calcified plaque is identified in the distribution of the LAD.   Nuclear stress test 11/2012-no ischemia, ejection fraction 42%  ASSESSMENT AND PLAN:  1.  Chronic diastolic heart failure-No coronary artery disease on catheterization. Ejection fraction on 04/2015 echocardiogram was now low normal 50%. Previously in the setting of atrial fibrillation with rapid ventricular response, ejection  fraction was as low as 20-25%. interestingly, his dyspnea did not change all that much in the hospital after diuresing 3-4 L. His BNP on admission was actually normal. Weight gain is the only indicator of potential excess fluid. However, he has no other S&S of HF.  We will continue his Lasix. He is unsure if hypotension occurred with increased Lasix so will continue with daily Lasix but he can increase to BID prn.  Continue lower dose of Coreg 6.58m daily since he feels better with this change.   2. Paroxysmal atrial fibrillation, nonvalvular- In NSR. Decrease amiodarone from 20106mdaily to 100 daily. Last episode of atrial fibrillation occurred in 2015 in the setting of finger infection. At that time, cardioversion was delayed because of possible left atrial appendage thrombus. Back in 2013, his amiodarone was stopped because of several years of normal sinus rhythm detected postoperatively, bioprosthetic aortic valve replacement. Pulmonary did not feel as though his CT findings or lab work (ESR normal) were indicative of amiodarone toxicity causing his dyspnea. We will continue. He does well in normal rhythm. LFT, normal. Checking TSH. Checking blood work with amiodarone.  3. Obesity-continue to encourage weight loss.   4. 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 Due in 04/2016  5. 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  6. Difficult to control diabetes-has had perirectal abscess, foot ulcer, this resulted in right below-knee amputation. Seeing endocrinology.  7. Bioprosthetic aortic valve-replaced in 2009. Echo reviewed as above. Reassuring.  8. Chronic anticoagulation-Eliquis 5 mg twice a day. Wright his hemoglobin of 9.3, we will discontinue his aspirin 8111mContinue with Eliquis only.   9. 60atigue-multifactorial. He wonders if he has some depression surrounding his amputation. Fell down during the holidays. He will discuss further with Dr.  BarDrema Dallas necessary.      Current medicines are reviewed at length with the patient today.  The patient does not have concerns regarding medicines.  The following changes have been made:  Decrease amiodarone  Labs/ tests ordered today include:   Orders Placed This Encounter  Procedures  . CBC  . Comprehensive metabolic panel  .  TSH  . T4, free     Disposition:   FU with Carmen Vallecillo in 6 months  Signed, Thomas Furbish, MD  09/26/2016 9:48 AM    [facility] [address], [place], [place]  [address] Phone: [phone]; Fax: [phone]

## 2016-09-27 LAB — COMPREHENSIVE METABOLIC PANEL
ALT: 14 IU/L (ref 0–44)
AST: 15 IU/L (ref 0–40)
Albumin/Globulin Ratio: 1.5 (ref 1.2–2.2)
Albumin: 4.3 g/dL (ref 3.6–4.8)
Alkaline Phosphatase: 95 IU/L (ref 39–117)
BUN/Creatinine Ratio: 16 (ref 10–24)
BUN: 21 mg/dL (ref 8–27)
Bilirubin Total: 0.6 mg/dL (ref 0.0–1.2)
CALCIUM: 9.4 mg/dL (ref 8.6–10.2)
CHLORIDE: 92 mmol/L — AB (ref 96–106)
CO2: 25 mmol/L (ref 18–29)
CREATININE: 1.32 mg/dL — AB (ref 0.76–1.27)
GFR, EST AFRICAN AMERICAN: 64 mL/min/{1.73_m2} (ref >59)
GFR, EST NON AFRICAN AMERICAN: 55 mL/min/{1.73_m2} — AB (ref >59)
GLUCOSE: 448 mg/dL — AB (ref 65–99)
Globulin, Total: 2.8 g/dL (ref 1.5–4.5)
Potassium: 4.7 mmol/L (ref 3.5–5.2)
Sodium: 134 mmol/L (ref 134–144)
TOTAL PROTEIN: 7.1 g/dL (ref 6.0–8.5)

## 2016-09-27 LAB — CBC
Hematocrit: 44.7 % (ref 37.5–51.0)
Hemoglobin: 15.4 g/dL (ref 13.0–17.7)
MCH: 29.1 pg (ref 26.6–33.0)
MCHC: 34.5 g/dL (ref 31.5–35.7)
MCV: 85 fL (ref 79–97)
PLATELETS: 273 10*3/uL (ref 150–379)
RBC: 5.29 x10E6/uL (ref 4.14–5.80)
RDW: 13.4 % (ref 12.3–15.4)
WBC: 8.5 10*3/uL (ref 3.4–10.8)

## 2016-09-27 LAB — T4, FREE: Free T4: 1.66 ng/dL (ref 0.82–1.77)

## 2016-09-27 LAB — TSH: TSH: 2.27 u[IU]/mL (ref 0.450–4.500)

## 2016-10-04 DIAGNOSIS — Z89511 Acquired absence of right leg below knee: Secondary | ICD-10-CM | POA: Diagnosis not present

## 2016-10-04 DIAGNOSIS — I509 Heart failure, unspecified: Secondary | ICD-10-CM | POA: Diagnosis not present

## 2016-10-05 DIAGNOSIS — Z89511 Acquired absence of right leg below knee: Secondary | ICD-10-CM | POA: Diagnosis not present

## 2016-10-05 DIAGNOSIS — I509 Heart failure, unspecified: Secondary | ICD-10-CM | POA: Diagnosis not present

## 2016-10-17 ENCOUNTER — Other Ambulatory Visit: Payer: Self-pay | Admitting: Endocrinology

## 2016-10-26 ENCOUNTER — Encounter: Payer: Commercial Managed Care - HMO | Attending: Physical Medicine & Rehabilitation | Admitting: Registered Nurse

## 2016-10-26 DIAGNOSIS — R269 Unspecified abnormalities of gait and mobility: Secondary | ICD-10-CM | POA: Insufficient documentation

## 2016-10-26 DIAGNOSIS — R52 Pain, unspecified: Secondary | ICD-10-CM | POA: Insufficient documentation

## 2016-10-26 DIAGNOSIS — E1142 Type 2 diabetes mellitus with diabetic polyneuropathy: Secondary | ICD-10-CM | POA: Insufficient documentation

## 2016-10-26 DIAGNOSIS — N289 Disorder of kidney and ureter, unspecified: Secondary | ICD-10-CM | POA: Insufficient documentation

## 2016-10-26 DIAGNOSIS — Z09 Encounter for follow-up examination after completed treatment for conditions other than malignant neoplasm: Secondary | ICD-10-CM | POA: Insufficient documentation

## 2016-10-26 DIAGNOSIS — I351 Nonrheumatic aortic (valve) insufficiency: Secondary | ICD-10-CM | POA: Insufficient documentation

## 2016-10-26 DIAGNOSIS — Z952 Presence of prosthetic heart valve: Secondary | ICD-10-CM | POA: Insufficient documentation

## 2016-10-26 DIAGNOSIS — Z89511 Acquired absence of right leg below knee: Secondary | ICD-10-CM | POA: Insufficient documentation

## 2016-10-26 DIAGNOSIS — I480 Paroxysmal atrial fibrillation: Secondary | ICD-10-CM | POA: Insufficient documentation

## 2016-10-26 DIAGNOSIS — I5042 Chronic combined systolic (congestive) and diastolic (congestive) heart failure: Secondary | ICD-10-CM | POA: Insufficient documentation

## 2016-10-26 DIAGNOSIS — I110 Hypertensive heart disease with heart failure: Secondary | ICD-10-CM | POA: Insufficient documentation

## 2016-11-04 DIAGNOSIS — I509 Heart failure, unspecified: Secondary | ICD-10-CM | POA: Diagnosis not present

## 2016-11-04 DIAGNOSIS — Z89511 Acquired absence of right leg below knee: Secondary | ICD-10-CM | POA: Diagnosis not present

## 2016-11-05 DIAGNOSIS — I509 Heart failure, unspecified: Secondary | ICD-10-CM | POA: Diagnosis not present

## 2016-11-05 DIAGNOSIS — Z89511 Acquired absence of right leg below knee: Secondary | ICD-10-CM | POA: Diagnosis not present

## 2016-12-02 DIAGNOSIS — Z89511 Acquired absence of right leg below knee: Secondary | ICD-10-CM | POA: Diagnosis not present

## 2016-12-02 DIAGNOSIS — I509 Heart failure, unspecified: Secondary | ICD-10-CM | POA: Diagnosis not present

## 2016-12-03 DIAGNOSIS — I509 Heart failure, unspecified: Secondary | ICD-10-CM | POA: Diagnosis not present

## 2016-12-03 DIAGNOSIS — Z89511 Acquired absence of right leg below knee: Secondary | ICD-10-CM | POA: Diagnosis not present

## 2016-12-24 ENCOUNTER — Other Ambulatory Visit: Payer: Self-pay | Admitting: Endocrinology

## 2017-01-11 ENCOUNTER — Other Ambulatory Visit: Payer: Self-pay | Admitting: *Deleted

## 2017-01-14 MED ORDER — APIXABAN 5 MG PO TABS: 5.0000 mg | ORAL_TABLET | Freq: Two times a day (BID) | ORAL | 3 refills | Status: DC

## 2017-01-14 NOTE — Telephone Encounter (Signed)
Received request for Eliquis 5mg ; pt is 71 yrs old, Wt-108.9kg on 09/26/16, Crea-1.32 on 09/26/16, & last seen by Dr. Marlou Porch on 09/26/16. Will send in refill as requested.

## 2017-01-15 DIAGNOSIS — I480 Paroxysmal atrial fibrillation: Secondary | ICD-10-CM | POA: Diagnosis not present

## 2017-01-15 DIAGNOSIS — E1165 Type 2 diabetes mellitus with hyperglycemia: Secondary | ICD-10-CM | POA: Diagnosis not present

## 2017-01-15 DIAGNOSIS — J449 Chronic obstructive pulmonary disease, unspecified: Secondary | ICD-10-CM | POA: Diagnosis not present

## 2017-01-15 DIAGNOSIS — I429 Cardiomyopathy, unspecified: Secondary | ICD-10-CM | POA: Diagnosis not present

## 2017-01-15 DIAGNOSIS — R05 Cough: Secondary | ICD-10-CM | POA: Diagnosis not present

## 2017-01-15 DIAGNOSIS — Z23 Encounter for immunization: Secondary | ICD-10-CM | POA: Diagnosis not present

## 2017-01-15 DIAGNOSIS — E7800 Pure hypercholesterolemia, unspecified: Secondary | ICD-10-CM | POA: Diagnosis not present

## 2017-01-15 DIAGNOSIS — Z952 Presence of prosthetic heart valve: Secondary | ICD-10-CM | POA: Diagnosis not present

## 2017-01-15 DIAGNOSIS — I5032 Chronic diastolic (congestive) heart failure: Secondary | ICD-10-CM | POA: Diagnosis not present

## 2017-01-21 ENCOUNTER — Other Ambulatory Visit: Payer: Self-pay | Admitting: *Deleted

## 2017-01-21 MED ORDER — APIXABAN 5 MG PO TABS: 5.0000 mg | ORAL_TABLET | Freq: Two times a day (BID) | ORAL | 2 refills | Status: DC

## 2017-01-21 MED ORDER — ATORVASTATIN CALCIUM 10 MG PO TABS
10.0000 mg | ORAL_TABLET | Freq: Every morning | ORAL | 2 refills | Status: DC
Start: 2017-01-21 — End: 2017-10-24

## 2017-02-14 ENCOUNTER — Ambulatory Visit (INDEPENDENT_AMBULATORY_CARE_PROVIDER_SITE_OTHER): Payer: Commercial Managed Care - HMO | Admitting: Orthopedic Surgery

## 2017-02-14 ENCOUNTER — Ambulatory Visit (INDEPENDENT_AMBULATORY_CARE_PROVIDER_SITE_OTHER): Payer: Medicare HMO | Admitting: Orthopedic Surgery

## 2017-02-14 ENCOUNTER — Encounter (INDEPENDENT_AMBULATORY_CARE_PROVIDER_SITE_OTHER): Payer: Self-pay | Admitting: Orthopedic Surgery

## 2017-02-14 VITALS — Ht 72.0 in | Wt 240.0 lb

## 2017-02-14 DIAGNOSIS — M7541 Impingement syndrome of right shoulder: Secondary | ICD-10-CM | POA: Insufficient documentation

## 2017-02-14 DIAGNOSIS — E1142 Type 2 diabetes mellitus with diabetic polyneuropathy: Secondary | ICD-10-CM

## 2017-02-14 NOTE — Progress Notes (Signed)
Office Visit Note   Patient: Thomas Wright           Date of Birth: [birth year]           MRN: [id number] Visit Date: 02/14/2017              Requested by: Leighton Ruff, MD [place], [place] [address] PCP: Leighton Ruff, MD  Chief Complaint  Patient presents with  . Right Shoulder - Pain      HPI: Patient presents for evaluation for his right shoulder. Patient states he had MRI scan performed several years ago and this did show rotator cuff pathology. He was advised regarding the possibility of shoulder arthroscopy for debridement of rotator cuff tear. Patient states the pain is constant the point where he cannot perform his activities of daily living has decreased range of motion he states he can't take it anymore. 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  Assessment & Plan: Visit Diagnoses:  1. Impingement syndrome of right shoulder   2. Diabetic polyneuropathy associated with type 2 diabetes mellitus ([place])     Plan: Due to failure conservative care and pain with activities of daily living patient would like to proceed with arthroscopic intervention for the right shoulder. We'll plan for arthroscopic debridement of the rotator cuff tear SLAP lesion and subacromial decompression. Risk and benefits were discussed feel the patient should achieve approximately 80% improvement in his symptoms. Patient states he understands and wishes to proceed at this time.  Follow-Up Instructions: Return in about 2 weeks (around 02/28/2017).   Ortho Exam  Patient is alert, oriented, no adenopathy, well-dressed, normal affect, normal respiratory effort. Examination patient has a normal gait. He has no focal motor weakness in either upper or lower extremities. He has pain with Neer and  Hawkins impingement test pain with drop arm test the right shoulder he has abduction to 90 external rotation and internal rotation of only 45. Patient has a stable right transtibial amputation. He does have some radicular numbness in his fingers and discussed that the shoulder arthroscopy would not improve his radicular symptoms into his fingers.  Imaging: No results found.  Labs: Lab Results  Component Value Date   HGBA1C 12.6 (H) 01/09/2016   HGBA1C 11.0 06/20/2015   HGBA1C 12.1 (H) 11/24/2014   ESRSEDRATE 80 (H) 01/10/2016   ESRSEDRATE 62 (H) 01/09/2016   ESRSEDRATE 11 05/10/2015   CRP 24.2 (H) 01/10/2016   CRP 24.8 (H) 01/09/2016   CRP 0.1 (L) 05/10/2015   REPTSTATUS 01/11/2016 FINAL 01/10/2016   GRAMSTAIN  07/29/2014    MODERATE WBC PRESENT,BOTH PMN AND MONONUCLEAR NO SQUAMOUS EPITHELIAL CELLS SEEN FEW GRAM POSITIVE COCCI IN PAIRS RARE GRAM NEGATIVE RODS Performed at [facility]  07/29/2014    MODERATE WBC PRESENT,BOTH PMN AND MONONUCLEAR NO SQUAMOUS EPITHELIAL CELLS SEEN FEW GRAM POSITIVE COCCI IN PAIRS RARE GRAM NEGATIVE RODS Performed at [facility]    CULT 2,000 COLONIES/mL INSIGNIFICANT GROWTH (A) 01/10/2016   LABORGA STAPHYLOCOCCUS AUREUS 07/29/2014    Orders:  No orders of the defined types were placed in this encounter.  No orders of the defined types were placed in this encounter.    Procedures: No procedures performed  Clinical Data: No additional findings.  ROS:  All other  systems negative, except as noted in the HPI. Review of Systems  Objective: Vital Signs: Ht 6' (1.829 m)   Wt 240 lb (108.9 kg)   BMI 32.55 kg/m   Specialty Comments:  No specialty comments available.  PMFS History: Patient Active Problem List   Diagnosis Date Noted  . Diabetic polyneuropathy associated with type 2 diabetes mellitus ([place]) 02/14/2017  . Impingement syndrome of right shoulder 02/14/2017  . Fluid retention   . Labile  blood glucose   . Toe erythema   . Hypoglycemia associated with type 2 diabetes mellitus ([place])   . Hyponatremia   . Diarrhea 01/17/2016  . Amputation of right lower extremity below knee with complication ([place]) 13/04/6577  . AKI (acute kidney injury) ([place])   . Type 2 diabetes mellitus with peripheral neuropathy (HCC)   . Status post aortic valve replacement   . Chronic combined systolic and diastolic congestive heart failure ([place])   . ATN (acute tubular necrosis) ([place])   . Postoperative pain   . Acute blood loss anemia   . Leukocytosis   . Morbid obesity ([place])   . Status post below knee amputation of right lower extremity ([place])   . Insulin dependent diabetes mellitus ([place]) 01/09/2016  . Diabetic foot infection ([place]) 01/09/2016  . Sepsis, unspecified organism ([place]) 01/09/2016  . PAF (paroxysmal atrial fibrillation) ([place]) 04/22/2015  . Essential hypertension 04/22/2015  . Obesity 04/22/2015  . Dyspnea 04/20/2015  . Acute on chronic diastolic CHF (congestive heart failure) ([place]) 04/19/2015  . Cellulitis of leg 11/23/2014  . Diabetes ([place]) 09/13/2014  . Chronic anticoagulation 09/13/2014  . Atrial flutter, unspecified   . Paroxysmal atrial fibrillation (HCC)   . Perirectal abscess   . Other hemorrhoids   . Tenosynovitis of finger 05/05/2014  . Cellulitis and abscess of digit 05/05/2014  . Back pain   . H/O aortic valve replacement with tissue graft   . Hypertriglyceridemia   . Hypertension    Past Medical History:  Diagnosis Date  . Arthritis    .  Marland Kitchen Cardiomyopathy ([place])    EF originally 20%-now 45%, no CAD on cath  . Chronic combined systolic and diastolic CHF (congestive heart failure) ([place])    a. 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 Reported h/o cath in 2008 without significant CAD.  Marland Kitchen Depression    "a little right now" (04/19/2015)  . Hyperlipidemia   . Hypertension   . Hypertriglyceridemia     . PAF (paroxysmal atrial fibrillation) ([place])    a. Postop 2008. b. recurrent afib 08/2014 with LAA clot noted on TEE. EF was down again at 25%. He was anticoagulated with Eliquis and placed on Amiodarone.He ultimately underwent TEE-DCCV 08/2014.  . S/P aortic valve replacement with bioprosthetic valve    a. bioprosthetic AVR in 05/2007 in [place].  . Type II diabetes mellitus ([place])     Family History  Problem Relation Age of Onset  . Colon cancer Father   . Diabetes Father   . Liver disease Brother   . Heart murmur Child   . Congenital heart disease Other   . Diabetes Maternal Grandmother   . Heart disease Maternal Grandfather        A Fib    Past Surgical History:  Procedure Laterality Date  . AMPUTATION Right 07/29/2014   Procedure: AMPUTATION RIGHT LONG FINGER;  Surgeon: [name], MD;  Location: [place];  Service: Orthopedics;  Laterality: Right;  . AMPUTATION Right 01/13/2016   Procedure: RIGHT BELOW KNEE AMPUTATION;  Surgeon: Newt Minion, MD;  Location: [place];  Service: Orthopedics;  Laterality: Right;  . AORTIC VALVE REPLACEMENT  05/2007   with tissue graft  . APPLICATION OF WOUND VAC Right 01/13/2016   Procedure: APPLICATION OF WOUND VAC;  Surgeon: Newt Minion, MD;  Location: [place];  Service: Orthopedics;  Laterality: Right;  . CARDIAC CATHETERIZATION  "several"  . CARDIAC VALVE REPLACEMENT    . CARDIOVERSION N/A 07/23/2014   Procedure: CARDIOVERSION;  Surgeon: Josue Hector, MD;  Location: [place];  Service: Cardiovascular;  Laterality: N/A;  . CARDIOVERSION N/A 09/01/2014   Procedure: CARDIOVERSION;  Surgeon: [name], MD;  Location: [facility] ENDOSCOPY;  Service: Cardiovascular;  Laterality: N/A;  . I&D EXTREMITY Right 05/05/2014   Procedure: IRRIGATION AND DEBRIDEMENT EXTREMITY;  Surgeon: [name], MD;  Location: [facility];  Service: Orthopedics;  Laterality: Right;  . TEE WITHOUT CARDIOVERSION N/A 07/23/2014   Procedure: TRANSESOPHAGEAL ECHOCARDIOGRAM  (TEE);  Surgeon: Josue Hector, MD;  Location: [place];  Service: Cardiovascular;  Laterality: N/A;  . TEE WITHOUT CARDIOVERSION N/A 09/01/2014   Procedure: TRANSESOPHAGEAL ECHOCARDIOGRAM (TEE);  Surgeon: [name], MD;  Location: [place];  Service: Cardiovascular;  Laterality: N/A;  . TONSILLECTOMY  1954   Social History   Occupational History  . retired     Social History Main Topics  . Smoking status: Never Smoker  . Smokeless tobacco: Never Used  . Alcohol use 0.0 oz/week     Comment: 04/19/2015 "might have a beer or 2, 2-3 times/yr"  . Drug use: No  . Sexual activity: No

## 2017-02-19 ENCOUNTER — Ambulatory Visit (INDEPENDENT_AMBULATORY_CARE_PROVIDER_SITE_OTHER): Payer: Medicare HMO | Admitting: Pulmonary Disease

## 2017-02-19 ENCOUNTER — Encounter: Payer: Self-pay | Admitting: Pulmonary Disease

## 2017-02-19 VITALS — BP 130/76 | HR 78 | Ht 73.0 in | Wt 248.0 lb

## 2017-02-19 DIAGNOSIS — Z79899 Other long term (current) drug therapy: Secondary | ICD-10-CM

## 2017-02-19 DIAGNOSIS — R0600 Dyspnea, unspecified: Secondary | ICD-10-CM | POA: Diagnosis not present

## 2017-02-19 NOTE — Patient Instructions (Signed)
We will schedule you for a high-resolution CT of the chest to evaluate for interstitial lung disease Schedule for pulmonary function tests.  Return to clinic in 1-2 months.

## 2017-02-19 NOTE — Progress Notes (Addendum)
Subjective:    Patient ID: Thomas Wright, male    DOB: May 27, 1946, 71 y.o.   MRN: [id number]   HPI  PROBLEM LIST: Dyspnea on exertion Abnormal PFTs- Restrictive pattern.  Thomas Wright is a 71 y/o sent to pulmonary clinic for evaluation of dyspnea. He states that he has been feeling dyspneic for several years. But symptoms have been worsening for 3 months. He is symptomatic with minimal exertion. Unable to walk on plain surface. Sometimes he also gets dyspneic at rest. Associated with cough, non productive in nature.Somtimes he brings up whitish mucus. No wheezing.  He has significant exposure to coal dust and asbestos in past (see below).   He has significant history of cardiac disease with bioprosthetic AV valve, diastolic heart failure, atrial fibrillation on amiodarone. Admitted from 8/2 to 8/5 with acute CHF.   Interm history: Reports worsening dyspnea since last visit associated with cough, clear mucus. Denies any wheezing, fevers, chills, hemoptysis. He continues on amiodarone and in January the dose was cut from 200 twice a day to 200 daily  Social history. Never smoker Occasional alcohol use.  Worked in [employer] for 4-5 years during which he had significant exposure to coal dust. 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 He wore protective clothing then that was made of asbestos.  Investigations so far have shown Echo (04/20/15) - Left ventricle: The cavity size was normal. Systolic function was normal. The estimated ejection fraction was in the range of 50% to 55%. Wall motion was normal; there were no regional wall motion abnormalities. Doppler parameters are consistent with abnormal left ventricular relaxation (grade 1 diastolic dysfunction). Doppler parameters are consistent with elevated ventricular end-diastolic filling pressure. - Ventricular septum: Septal motion showed paradox. - Aortic valve: Bioprosthetic valve sits well in the aortic position. There is  mild central aortic regurgitation and no paravalvular leak. Normal transaortic gradients. Transvalvular velocity was within the normal range. There was no stenosis. There was mild regurgitation. Valve area (VTI): 3.58 cm^2. Valve area (Vmax): 3.57 cm^2. Valve area (Vmean): 3.24 cm^2. - Mitral valve: Calcified annulus. Mildly thickened leaflets . There was mild regurgitation. Valve area by continuity equation (using LVOT flow): 4 cm^2. - Left atrium: The atrium was mildly dilated. - Right ventricle: The cavity size was mildly dilated. Wall thickness was normal. - Right atrium: The atrium was mildly dilated. - Pulmonary arteries: Systolic pressure was within the normal range.  CT scan angio (04/20/15) Negative for pulmonary emboli. Left ventricular enlargement with left ventricular hypertrophy. Aortic valve replacement. Cholelithiasis.  PFTs (04/27/15) FVC 4.6 (64%) FEV1 3.41 (69%) F/F 74 TLC 7.12 (68%) DLCO 79% Impression: Mild restriction and reductions in DLCO.  PFTs (06/10/15) FVC 2.6  (56%) FEV1 1.99 (58%) F/F 77 TLC 4.89 (68%) DLCO 78%  Past Medical History:  Diagnosis Date  . Arthritis    .  Marland Kitchen Cardiomyopathy ([place])    EF originally 20%-now 45%, no CAD on cath  . Chronic combined systolic and diastolic CHF (congestive heart failure) ([place])    a. 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 Reported h/o cath in 2008 without significant CAD.  Marland Kitchen Depression    "a little right now" (04/19/2015)  . Hyperlipidemia   . Hypertension   . Hypertriglyceridemia   . PAF (paroxysmal atrial fibrillation) ([place])    a. Postop 2008. b. recurrent afib 08/2014 with LAA clot noted on TEE. EF was down again at  25%. He was anticoagulated with Eliquis and placed on Amiodarone.He ultimately underwent TEE-DCCV 08/2014.  . S/P aortic valve replacement with bioprosthetic valve    a. bioprosthetic AVR in 05/2007 in  [place].  . Type II diabetes mellitus ([place])      Review of Systems  Constitutional: Negative for fever and unexpected weight change.  HENT: Negative for congestion, dental problem, ear pain, nosebleeds, postnasal drip, rhinorrhea, sinus pressure, sneezing, sore throat and trouble swallowing.   Eyes: Negative for redness and itching.  Respiratory: Positive for cough, chest tightness and shortness of breath. Negative for wheezing.   Cardiovascular: Negative for palpitations and leg swelling.  Gastrointestinal: Negative for nausea and vomiting.  Genitourinary: Negative for dysuria.  Musculoskeletal: Negative for joint swelling.  Neurological: Negative for headaches.  Hematological: Does not bruise/bleed easily.  Psychiatric/Behavioral: Negative for dysphoric mood. The patient is not nervous/anxious.        Objective:   Physical Exam  Constitutional: He is oriented to person, place, and time. He appears well-developed and well-nourished.  HENT:  Mouth/Throat: Oropharynx is clear and moist.  Eyes: Pupils are equal, round, and reactive to light.  Neck: Normal range of motion. Neck supple.  Cardiovascular: Normal rate, regular rhythm and normal heart sounds.   Pulmonary/Chest: Effort normal and breath sounds normal.  Abdominal: Soft. Bowel sounds are normal.  Musculoskeletal: Normal range of motion.  Rt BKA  Neurological: He is alert and oriented to person, place, and time.  Skin: Skin is warm and dry.      Assessment & Plan:  Eval for ILD  His history is significant for exposure in the 60s and 70s to coal dust coal dust and asbestos. While there is no clear evidence of lung fibrosis or interstitial lung disease, there does appear to be subtle groundglass opacities in the bases on CT. This could reflect the beginning of a fibrotic interstitial lung disease process related to his occupational exposures. He does not have any symptoms or signs suggesting of connective tissue disease and  labs are negative so far. He is on amiodarone to treat atrial fibrillation.  Repeat PFTs show mild restrictive lung disease and reduction in diffusion capacity which corrects for alveolar capacity. I believe this may be related to his body habitus and obesity.    Given his worsening dyspnea we will reevaluate with a high-resolution CT and pulmonary function tests. There is no clear lung etiology he may need cardiac pulmonary stress test to further evaluate the reason for dyspnea.  Plan: - High res CT, PFTs  Marshell Garfinkel MD [facility] Pager [phone] If no answer or after 3pm call: [phone] 02/19/2017, 12:14 PM

## 2017-02-19 NOTE — Addendum Note (Signed)
Addended by: Collier Salina on: 02/19/2017 12:36 PM   Modules accepted: Orders

## 2017-02-26 ENCOUNTER — Ambulatory Visit (INDEPENDENT_AMBULATORY_CARE_PROVIDER_SITE_OTHER)
Admission: RE | Admit: 2017-02-26 | Discharge: 2017-02-26 | Disposition: A | Discharge status: Home / Self Care | Payer: Medicare HMO | Source: Ambulatory Visit | Attending: Pulmonary Disease | Admitting: Pulmonary Disease

## 2017-02-26 DIAGNOSIS — R0600 Dyspnea, unspecified: Secondary | ICD-10-CM

## 2017-02-26 DIAGNOSIS — Z79899 Other long term (current) drug therapy: Secondary | ICD-10-CM

## 2017-02-28 DIAGNOSIS — E1165 Type 2 diabetes mellitus with hyperglycemia: Secondary | ICD-10-CM | POA: Diagnosis not present

## 2017-02-28 DIAGNOSIS — I5032 Chronic diastolic (congestive) heart failure: Secondary | ICD-10-CM | POA: Diagnosis not present

## 2017-02-28 DIAGNOSIS — R0989 Other specified symptoms and signs involving the circulatory and respiratory systems: Secondary | ICD-10-CM | POA: Diagnosis not present

## 2017-02-28 DIAGNOSIS — E7800 Pure hypercholesterolemia, unspecified: Secondary | ICD-10-CM | POA: Diagnosis not present

## 2017-02-28 DIAGNOSIS — I480 Paroxysmal atrial fibrillation: Secondary | ICD-10-CM | POA: Diagnosis not present

## 2017-02-28 DIAGNOSIS — Z1211 Encounter for screening for malignant neoplasm of colon: Secondary | ICD-10-CM | POA: Diagnosis not present

## 2017-02-28 DIAGNOSIS — Z0000 Encounter for general adult medical examination without abnormal findings: Secondary | ICD-10-CM | POA: Diagnosis not present

## 2017-02-28 DIAGNOSIS — E781 Pure hyperglyceridemia: Secondary | ICD-10-CM | POA: Diagnosis not present

## 2017-02-28 DIAGNOSIS — Z89511 Acquired absence of right leg below knee: Secondary | ICD-10-CM | POA: Diagnosis not present

## 2017-03-01 ENCOUNTER — Other Ambulatory Visit: Payer: Self-pay | Admitting: Family Medicine

## 2017-03-01 DIAGNOSIS — R0989 Other specified symptoms and signs involving the circulatory and respiratory systems: Secondary | ICD-10-CM

## 2017-03-04 ENCOUNTER — Telehealth: Payer: Self-pay | Admitting: Cardiology

## 2017-03-04 NOTE — Telephone Encounter (Signed)
New message    Dr. Drema Dallas would like for Dr. Marlou Porch to call to discuss pt. Dr. Drema Dallas would like to put pt on anti depressants but she wants to discuss potential interacts with his other medications.

## 2017-03-04 NOTE — Telephone Encounter (Signed)
Will route to [facility] for advice on anti depressants interacting with cardiac meds. Will route to Dr. Marlou Porch to make him aware as well.

## 2017-03-05 ENCOUNTER — Ambulatory Visit
Admission: RE | Admit: 2017-03-05 | Discharge: 2017-03-05 | Disposition: A | Discharge status: Home / Self Care | Payer: Medicare HMO | Source: Ambulatory Visit | Attending: Family Medicine | Admitting: Family Medicine

## 2017-03-05 DIAGNOSIS — I6523 Occlusion and stenosis of bilateral carotid arteries: Secondary | ICD-10-CM | POA: Diagnosis not present

## 2017-03-05 DIAGNOSIS — R0989 Other specified symptoms and signs involving the circulatory and respiratory systems: Secondary | ICD-10-CM

## 2017-03-05 NOTE — Telephone Encounter (Signed)
Left message for Katie from Dr. Drema Dallas office to call back.

## 2017-03-05 NOTE — Telephone Encounter (Signed)
Spoke with Dr. Drema Dallas and made her aware of [facility] [place], [facility] recommendations. She states that she is going to see if the patient wants to start Cymbalta. If he does she states that she will get a baseline EKG in their office and repeat it 1 week after starting the medication. She states that she will fax over all of the information obtained.

## 2017-03-05 NOTE — Telephone Encounter (Signed)
Will need to be careful with SSRIs due to potential for QTc prolongation, especially in combination with amiodarone. Would prefer SNRI such as Cymbalta to minimize this interaction. Pt has not had an EKG checked with [place] in a year - would recommend monitoring this at baseline and 1 week after starting therapy to ensure QTc remains stable.

## 2017-03-05 NOTE — Telephone Encounter (Signed)
Follow Up:   Thomas Wright returning your call.She says if you were calling for Dr Smitty Knudsen have him overhead paged. If you need her just call and ask for her.

## 2017-03-06 ENCOUNTER — Other Ambulatory Visit: Payer: Self-pay | Admitting: Pulmonary Disease

## 2017-03-06 DIAGNOSIS — R0600 Dyspnea, unspecified: Secondary | ICD-10-CM

## 2017-03-08 ENCOUNTER — Other Ambulatory Visit: Payer: Self-pay

## 2017-03-08 ENCOUNTER — Ambulatory Visit (INDEPENDENT_AMBULATORY_CARE_PROVIDER_SITE_OTHER): Payer: Medicare HMO | Admitting: Pulmonary Disease

## 2017-03-08 DIAGNOSIS — D4989 Neoplasm of unspecified behavior of other specified sites: Secondary | ICD-10-CM

## 2017-03-08 DIAGNOSIS — R0600 Dyspnea, unspecified: Secondary | ICD-10-CM | POA: Diagnosis not present

## 2017-03-08 DIAGNOSIS — D150 Benign neoplasm of thymus: Principal | ICD-10-CM

## 2017-03-08 DIAGNOSIS — R918 Other nonspecific abnormal finding of lung field: Secondary | ICD-10-CM

## 2017-03-08 LAB — PULMONARY FUNCTION TEST
DL/VA % PRED: 125 %
DL/VA: 5.82 ml/min/mmHg/L
DLCO COR % PRED: 69 %
DLCO COR: 23.05 ml/min/mmHg
DLCO unc % pred: 66 %
DLCO unc: 21.94 ml/min/mmHg
FEF 25-75 POST: 2.98 L/s
FEF 25-75 Pre: 1.59 L/sec
FEF2575-%CHANGE-POST: 87 %
FEF2575-%Pred-Post: 117 %
FEF2575-%Pred-Pre: 62 %
FEV1-%Change-Post: 15 %
FEV1-%Pred-Post: 56 %
FEV1-%Pred-Pre: 48 %
FEV1-POST: 1.88 L
FEV1-Pre: 1.63 L
FEV1FVC-%CHANGE-POST: 7 %
FEV1FVC-%Pred-Pre: 108 %
FEV6-%Change-Post: 8 %
FEV6-%PRED-PRE: 47 %
FEV6-%Pred-Post: 51 %
FEV6-Post: 2.2 L
FEV6-Pre: 2.03 L
FEV6FVC-%PRED-PRE: 106 %
FEV6FVC-%Pred-Post: 106 %
FVC-%Change-Post: 8 %
FVC-%PRED-POST: 48 %
FVC-%Pred-Pre: 44 %
FVC-Post: 2.2 L
FVC-Pre: 2.03 L
POST FEV1/FVC RATIO: 86 %
POST FEV6/FVC RATIO: 100 %
PRE FEV1/FVC RATIO: 80 %
Pre FEV6/FVC Ratio: 100 %
RV % pred: 96 %
RV: 2.36 L
TLC % PRED: 64 %
TLC: 4.57 L

## 2017-03-08 NOTE — Progress Notes (Signed)
PFT completed today.Tammee Thielke,CMA  

## 2017-03-08 NOTE — Telephone Encounter (Signed)
Agree with plan. Monitor QT with ecg [name], MD

## 2017-03-11 ENCOUNTER — Telehealth: Payer: Self-pay | Admitting: Cardiology

## 2017-03-11 NOTE — Telephone Encounter (Signed)
spoke with pt who is stating the pulmonary MD he saw said something about seeing something around his heart.  Pt states he did not know what he was talking about.  Advised he should call the pulmonary MD to clarify as I do not know what he was talking about.  He saw pulmonary for the evaluation of worsening SOB.  He had PFTs and a CT scan.  IMPRESSION: 1. No evidence of interstitial lung disease at this time . 2. 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 3. 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 Given the small size, continued surveillance is warranted with chest CT with IV contrast in 6 months. Thoracic surgical consultation may be considered. 4. Three-vessel coronary atherosclerosis. 5. Cholelithiasis.CT scan.    Pt was asking if is OK for him to have surgery tomorrow as scheduled on his right shoulder.  Advised Surgeon should contact the office for surgical clearance if needed and has not already been completed..  Pt states understanding and thanked me for my time.

## 2017-03-11 NOTE — Telephone Encounter (Signed)
New message    Pt is calling asking for a call back. He has some questions about a scan he had done at his pulmonary doctor.

## 2017-03-14 ENCOUNTER — Other Ambulatory Visit: Payer: Self-pay | Admitting: Cardiology

## 2017-03-15 ENCOUNTER — Other Ambulatory Visit: Payer: Self-pay | Admitting: Gastroenterology

## 2017-03-15 DIAGNOSIS — K5900 Constipation, unspecified: Secondary | ICD-10-CM | POA: Diagnosis not present

## 2017-03-15 DIAGNOSIS — Z86010 Personal history of colonic polyps: Secondary | ICD-10-CM | POA: Diagnosis not present

## 2017-03-25 ENCOUNTER — Inpatient Hospital Stay (INDEPENDENT_AMBULATORY_CARE_PROVIDER_SITE_OTHER): Payer: Medicare HMO | Admitting: Orthopedic Surgery

## 2017-04-01 ENCOUNTER — Ambulatory Visit (INDEPENDENT_AMBULATORY_CARE_PROVIDER_SITE_OTHER): Payer: Medicare HMO | Admitting: Cardiology

## 2017-04-01 ENCOUNTER — Encounter: Payer: Self-pay | Admitting: Cardiology

## 2017-04-01 VITALS — BP 138/70 | HR 67 | Ht 72.0 in | Wt 260.4 lb

## 2017-04-01 DIAGNOSIS — I779 Disorder of arteries and arterioles, unspecified: Secondary | ICD-10-CM | POA: Diagnosis not present

## 2017-04-01 DIAGNOSIS — R0602 Shortness of breath: Secondary | ICD-10-CM

## 2017-04-01 DIAGNOSIS — Z89511 Acquired absence of right leg below knee: Secondary | ICD-10-CM | POA: Diagnosis not present

## 2017-04-01 DIAGNOSIS — Z954 Presence of other heart-valve replacement: Secondary | ICD-10-CM

## 2017-04-01 DIAGNOSIS — I480 Paroxysmal atrial fibrillation: Secondary | ICD-10-CM

## 2017-04-01 DIAGNOSIS — Z01818 Encounter for other preprocedural examination: Secondary | ICD-10-CM | POA: Diagnosis not present

## 2017-04-01 DIAGNOSIS — D150 Benign neoplasm of thymus: Secondary | ICD-10-CM | POA: Diagnosis not present

## 2017-04-01 DIAGNOSIS — I739 Peripheral vascular disease, unspecified: Secondary | ICD-10-CM

## 2017-04-01 DIAGNOSIS — I5032 Chronic diastolic (congestive) heart failure: Secondary | ICD-10-CM

## 2017-04-01 DIAGNOSIS — D4989 Neoplasm of unspecified behavior of other specified sites: Secondary | ICD-10-CM

## 2017-04-01 DIAGNOSIS — Z79899 Other long term (current) drug therapy: Secondary | ICD-10-CM

## 2017-04-01 NOTE — Progress Notes (Signed)
Cardiology Office Note   Date:  04/01/2017   ID:  Thomas Wright, DOB 1946/03/26, MRN [id number]  PCP:  Leighton Ruff, MD  Cardiologist:   [name], MD       History of Present Illness: Thomas Wright is a 71 y.o. 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 Earnstine Regal in [facility] [place] looked good, nonvalvular atrial fibrillation, diabetes difficult to control who presents for follow-up of diastolic heart failure, chronic, hospitalization in early August 2016 with 20 pound weight gain. Interestingly, his BNP was normal. No changes on chest CT scan to suggest amiodarone toxicity. He was down 3.4 L at discharge and pulmonary function studies demonstrated moderate to severe restriction consistent with fibrosis or interstitial inflammations. Pulmonary referral was made.  Amputation BKA. Dr. Sharol Given, Small cut at home which then led to gangrene   I reviewed pulmonary note. There was significant exposure in the 60s and 70s to coal dust. 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 Autoimmune serologies were drawn. Sedimentation rate was normal at 11.  Previously he had battled with hypotension at home SBPs 50s-60s.  Lightheaded but no syncope, chest pain, palpitations, dyspnea.   He called in and was advised to decrease Coreg to 6.57m BID.  He has only been taking this when SBP > 125 and DBP > 85 he takes Coreg.  He has only been taking Coreg twice daily because BPs have been mostly lower than this.    His weight has fluctuated.  He reports abdominal distention but denies PND or LE edema.  He has stable 2 pillow orthopnea.  He reports early satiety in past year.  He doubles his Lasix about twice weekly when he feels his fluid is up (when it is difficult to button his pants).     Aortic valve was placed in 05/2007 with a 29 mm bioprosthetic Edwards valve. [place] his ejection fraction was 30% on cardiac catheterization however current ejection fraction appears to be 45% from 12/24/11 with aortic root dilatation of 4.5 cm.  He has been on amiodarone since his surgery. Previously had stopped it in 2013. 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 His atrial fibrillation came with a finger infection exacerbated by uncontrolled diabetes. He has had history as well of diabetic ketoacidosis, perirectal abscess.  09/26/16  - no strength, moderate. Fatigued. Weak[name] not [facility] him. Felt a bit depressed during the holidays. No significant chest pain or shortness of breath. Right below-knee amputation, getting around. Last step at home is a half an inch higher and is somewhat of a challenge for him. [name] going to set him up with Silver sneakers.   04/01/17  - He still having fatigue. Shortness of breath with activity. No chest pain. 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 He has a right shoulder surgery planned and prior to anesthesia I would like to obtain a stress test and echocardiogram. No syncope, no bleeding  Past Medical History:  Diagnosis Date  . Arthritis    .  .Marland KitchenCardiomyopathy ([place]    EF originally 20%-now 45%, no CAD on cath  . Chronic combined systolic and diastolic CHF (congestive heart failure) ([place]    a.  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 Reported h/o cath in 2008 without significant CAD.  Marland Kitchen Depression    "a little right now" (04/19/2015)  . Hyperlipidemia   . Hypertension   . Hypertriglyceridemia   . PAF (paroxysmal atrial fibrillation) ([place])    a. Postop 2008. b. recurrent afib 08/2014 with LAA clot noted on TEE. EF was down again  at 25%. He was anticoagulated with Eliquis and placed on Amiodarone.He ultimately underwent TEE-DCCV 08/2014.  . S/P aortic valve replacement with bioprosthetic valve    a. bioprosthetic AVR in 05/2007 in [place].  . Type II diabetes mellitus ([place])     Past Surgical History:  Procedure Laterality Date  . AMPUTATION Right 07/29/2014   Procedure: AMPUTATION RIGHT LONG FINGER;  Surgeon: [name], MD;  Location: [place];  Service: Orthopedics;  Laterality: Right;  . AMPUTATION Right 01/13/2016   Procedure: RIGHT BELOW KNEE AMPUTATION;  Surgeon: Newt Minion, MD;  Location: [place];  Service: Orthopedics;  Laterality: Right;  . AORTIC VALVE REPLACEMENT  05/2007   with tissue graft  . APPLICATION OF WOUND VAC Right 01/13/2016   Procedure: APPLICATION OF WOUND VAC;  Surgeon: Newt Minion, MD;  Location: [place];  Service: Orthopedics;  Laterality: Right;  . CARDIAC CATHETERIZATION  "several"  . CARDIAC VALVE REPLACEMENT    . CARDIOVERSION N/A 07/23/2014   Procedure: CARDIOVERSION;  Surgeon: Josue Hector, MD;  Location: [place];  Service: Cardiovascular;  Laterality: N/A;  . CARDIOVERSION N/A 09/01/2014   Procedure: CARDIOVERSION;  Surgeon: [name], MD;  [facility] END[facility]COPY;  Service: Cardiovascular;  Laterality: N/A;  . I&D EXTREMITY Right 05/05/2014   Procedure: IRRIGATION AND DEBRIDEMENT EXTREMITY;  Surgeon: [name], MD;  Location: [place];  Service: Orthopedics;  Laterality: Right;  . TEE WITHOUT CARDIOVERSION N/A 07/23/2014   Procedure: TRANSESOPHAGEAL ECHOCARDIOGRAM (TEE);  Surgeon: Josue Hector, MD;  Location: [place];  Service: Cardiovascular;  Laterality: N/A;  . TEE WITHOUT CARDIOVERSION N/A 09/01/2014   Procedure: TRANSESOPHAGEAL ECHOCARDIOGRAM (TEE);  Surgeon: [name], MD;  Location: [facility] ENDOSCOPY;  Service: Cardiovascular;  Laterality: N/A;  . TONSILLECTOMY  1954     Current Outpatient Prescriptions  Medication Sig Dispense Refill  . ACCU-CHEK  SMARTVIEW test strip USE AS INSTRUCTED TO CHECK BLOOD SUGAR FOUR TIMES DAILY 400 each 0  . amiodarone (PACERONE) 100 MG tablet Take 1 tablet (100 mg total) by mouth daily. 90 tablet 1  . apixaban (ELIQUIS) 5 MG TABS tablet Take 1 tablet (5 mg total) by mouth 2 (two) times daily. 180 tablet 2  . atorvastatin (LIPITOR) 10 MG tablet Take 1 tablet (10 mg total) by mouth every morning. 90 tablet 2  . Blood Glucose Monitoring Suppl (ACCU-CHEK NANO SMARTVIEW) w/Device KIT USE TO CHECK BLOOD SUGAR FOUR TIMES PER DAY (NEEDS APPOINTMENT) 1 kit 0  . carvedilol (COREG) 6.25 MG tablet Take 6.25 mg by mouth 2 (two) times daily with a meal.    . DHA-EPA-Coenzyme Q10-Vitamin E (CO Q-10 VITAMIN E FISH OIL PO) Take 1,000 mg by mouth 2 (two) times daily.    . furosemide (LASIX) 40 MG tablet Take 1 tablet (40 mg total) by mouth daily. 30 tablet 1  . Insulin Pen Needle 31G X 5 MM MISC 1 each by Does not apply route 4 (four) times daily -  before meals and at bedtime. 300 each 0  . Multiple Vitamin (  MULTIVITAMIN) tablet Take 1 tablet by mouth daily.    Marland Kitchen NOVOLIN R 100 UNIT/ML injection INJECT  6 UNITS BEFORE BREAKFAST  AND  LUNCH  AND  8 UNITS BEFORE SUPPER (DISCARD AND BEGIN A NEW VIAL AFTER 42 DAYS) 20 mL 3   No current facility-administered medications for this visit.     Allergies:   Amoxicill-clarithro-omeprazole    Social History:  The patient  reports that he has never smoked. He has never used smokeless tobacco. He reports that he drinks alcohol. He reports that he does not use drugs.   Family History:  The patient's family history includes Colon cancer in his father; Congenital heart disease in his other; Diabetes in his father and maternal grandmother; Heart disease in his maternal grandfather; Heart murmur in his child; Liver disease in his brother.    ROS:  Please see the history of present illness.   Otherwise, review of systems are positive for none.   All other systems are reviewed and negative.     PHYSICAL EXAM: VS:  BP 138/70   Pulse 67   Ht 6' (1.829 m)   Wt 260 lb 6.4 oz (118.1 kg)   BMI 35.32 kg/m  , BMI Body mass index is 35.32 kg/m. GEN: Well nourished, well developed, in no acute distress  HEENT: normal  Neck: no JVD, carotid bruits, or masses Cardiac: RRR; no murmurs, rubs, or gallops,no edema  Respiratory:  clear to auscultation bilaterally, normal work of breathing GI: soft, nontender, mild distended, + BS MS: no deformity or atrophy, RIGHT BKA Skin: warm and dry, no rash Neuro:  Grossly intact Psych: euthymic mood, full affect No change  EKG: Today 04/01/17-sinus rhythm first degree AV block and left bundle branch block heart rate 67 bpm. No change from prior in 2017.    Recent Labs: 09/26/2016: ALT 14; BUN 21; Creatinine, Ser 1.32; Hemoglobin 15.4; Platelets 273; Potassium 4.7; Sodium 134; TSH 2.270    Lipid Panel    Component Value Date/Time   CHOL 127 05/24/2015 0848   TRIG 123.0 05/24/2015 0848   HDL 38.70 (L) 05/24/2015 0848   CHOLHDL 3 05/24/2015 0848   VLDL 24.6 05/24/2015 0848   LDLCALC 64 05/24/2015 0848      Wt Readings from Last 3 Encounters:  04/01/17 260 lb 6.4 oz (118.1 kg)  02/19/17 248 lb (112.5 kg)  02/14/17 240 lb (108.9 kg)      Other studies Reviewed: Additional studies/ records that were reviewed today include: labs, radiology, office notes. Review of the above records demonstrates: as above   ECHO 07/22/14 - EF 25-30% (setting of AFIB RVR)  ECHO 12/08/13: - 45% to 50%.  - Aortic valve: A bioprosthesis was present. Trivial regurgitation. - Aortic root: The aortic root was moderately dilated. - Mitral valve: Calcified annulus. - Left atrium: The atrium was moderately dilated. Impressions:  ECHO 04/19/15: - Left ventricle: The cavity size was normal. Systolic function was  normal. The estimated ejection fraction was in the range of 50%  to 55%. Wall motion was normal; there were no regional wall  motion  abnormalities. Doppler parameters are consistent with  abnormal left ventricular relaxation (grade 1 diastolic  dysfunction). Doppler parameters are consistent with elevated  ventricular end-diastolic filling pressure. - Ventricular septum: Septal motion showed paradox. - Aortic valve: Bioprosthetic valve sits well in the aortic  position. There is mild central aortic regurgitation and no  paravalvular leak. Normal transaortic gradients. Transvalvular  velocity was within the normal range.  There was no stenosis.  There was mild regurgitation. Valve area (VTI): 3.58 cm^2. Valve  area (Vmax): 3.57 cm^2. Valve area (Vmean): 3.24 cm^2. - Mitral valve: Calcified annulus. Mildly thickened leaflets .  There was mild regurgitation. Valve area by continuity equation  (using LVOT flow): 4 cm^2. - Left atrium: The atrium was mildly dilated. - Right ventricle: The cavity size was mildly dilated. Wall  thickness was normal. - Right atrium: The atrium was mildly dilated. - Pulmonary arteries: Systolic pressure was within the normal  range.  Impressions:  - There is no significant difference when compared tio the prior  study from 11/22/2014.  CTA 12/14/13 -  Mild dilatation of the proximal aorta at the sinuses of Valsalva, measuring 4.2 cm in estimated diameter. The rest of the thoracic aorta is of normal caliber. No dissection is identified. Scattered calcified plaque is identified in the distribution of the LAD.   Nuclear stress test 11/2012-no ischemia, ejection fraction 42%  ASSESSMENT AND PLAN:  1.  Chronic diastolic heart failure-No coronary artery disease on catheterization in 2009. Ejection fraction on 04/2015 echocardiogram was now low normal 50%. Previously in the setting of atrial fibrillation with rapid ventricular response, ejection fraction was as low as 20-25%. interestingly, his dyspnea did not change all that much in the hospital after diuresing 3-4 L. His BNP on  admission was actually normal. Weight gain is the only indicator of potential excess fluid. However, he has no other S&S of HF.  We will continue his Lasix. He is unsure if hypotension occurred with increased Lasix so will continue with daily Lasix but he can increase to BID prn.  Continue lower dose of Coreg 6.85m daily since he feels better with this change. No major changes made.  2. Paroxysmal atrial fibrillation, nonvalvular- In NSR. Decrease amiodarone from 2044mdaily to 100 daily. Last episode of atrial fibrillation occurred in 2015 in the setting of finger infection. At that time, cardioversion was delayed because of possible left atrial appendage thrombus. Back in 2013, his amiodarone was stopped because of several years of normal sinus rhythm detected postoperatively, bioprosthetic aortic valve replacement. Pulmonary did not feel as though his CT findings or lab work (ESR normal) were indicative of amiodarone toxicity causing his dyspnea. We will continue. He does well in normal rhythm. LFT, normal. TSH. Continue to monitor with amiodarone.  3. Obesity-continue to encourage weight loss. Challenging for him without right lower extremity.  4. 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 Checking echo  5. 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  6. Difficult to control diabetes-has had perirectal abscess, foot ulcer, this resulted in right below-knee amputation. Seeing endocrinology.  7. Bioprosthetic aortic valve-replaced in 2009. Echo reviewed as above. Reassuring.  8. Chronic anticoagulation-Eliquis 5 mg twice a day. Given his hemoglobin of 9.3, we will discontinue his aspirin 812mContinue with Eliquis only.   9. Status post right lower extremity amputation  10. Coronary artery calcification - remember, prior to his aortic valve replacement 10 years ago he did not have any significant CAD on cardiac catheterization. I will however  check a pharmacologic stress test to ensure that he does not have any high risk lesions. This will be good prior to his shoulder surgery.  11. 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 I will send him to Dr. HenRoxan Hockey further review. He may just require CT surveillance.  12. Carotid artery disease - less than 50% bilaterally. He has a  referral in for Dr. Scot Dock.     Current medicines are reviewed at length with the patient today.  The patient does not have concerns regarding medicines.  The following changes have been made:  Decrease amiodarone  Labs/ tests ordered today include:   Orders Placed This Encounter  Procedures  . Ambulatory referral to Cardiothoracic Surgery  . Myocardial Perfusion Imaging  . EKG 12-Lead  . ECHOCARDIOGRAM COMPLETE     Disposition:   FU with [facility] in 6 months  Signed, [name], MD  04/01/2017 4:47 PM    [facility] [address], [place], [place]  [address] Phone: [phone]; Fax: [phone]

## 2017-04-01 NOTE — Patient Instructions (Signed)
Medication Instructions:  The current medical regimen is effective;  continue present plan and medications.  Testing/Procedures: Your physician has requested that you have an echocardiogram. Echocardiography is a painless test that uses sound waves to create images of your heart. It provides your doctor with information about the size and shape of your heart and how well your heart's chambers and valves are working. This procedure takes approximately one hour. There are no restrictions for this procedure.  Your physician has requested that you have a lexiscan myoview. For further information please visit [email or web address]. Please follow instruction sheet, as given.   You have been referred to Dr Modesto Charon.  Follow-Up: Follow up in 6 months with Dr. Marlou Porch.  You will receive a letter in the mail 2 months before you are due.  Please call us when you receive this letter to schedule your follow up appointment.   If you need a refill on your cardiac medications before your next appointment, please call your pharmacy.  Thank you for choosing [facility]!!

## 2017-04-04 DIAGNOSIS — R809 Proteinuria, unspecified: Secondary | ICD-10-CM | POA: Diagnosis not present

## 2017-04-04 DIAGNOSIS — Z89511 Acquired absence of right leg below knee: Secondary | ICD-10-CM | POA: Diagnosis not present

## 2017-04-04 DIAGNOSIS — E1142 Type 2 diabetes mellitus with diabetic polyneuropathy: Secondary | ICD-10-CM | POA: Diagnosis not present

## 2017-04-04 DIAGNOSIS — I5032 Chronic diastolic (congestive) heart failure: Secondary | ICD-10-CM | POA: Diagnosis not present

## 2017-04-04 DIAGNOSIS — E782 Mixed hyperlipidemia: Secondary | ICD-10-CM | POA: Diagnosis not present

## 2017-04-04 DIAGNOSIS — I110 Hypertensive heart disease with heart failure: Secondary | ICD-10-CM | POA: Diagnosis not present

## 2017-04-04 DIAGNOSIS — E559 Vitamin D deficiency, unspecified: Secondary | ICD-10-CM | POA: Diagnosis not present

## 2017-04-04 DIAGNOSIS — E1165 Type 2 diabetes mellitus with hyperglycemia: Secondary | ICD-10-CM | POA: Diagnosis not present

## 2017-04-04 DIAGNOSIS — Z794 Long term (current) use of insulin: Secondary | ICD-10-CM | POA: Diagnosis not present

## 2017-04-08 ENCOUNTER — Encounter: Payer: Self-pay | Admitting: Vascular Surgery

## 2017-04-09 ENCOUNTER — Telehealth (HOSPITAL_COMMUNITY): Payer: Self-pay | Admitting: *Deleted

## 2017-04-09 NOTE — Telephone Encounter (Signed)
Left message on voicemail per DPR in reference to upcoming appointment scheduled on 04/11/17 at 9:45 with detailed instructions given per Myocardial Perfusion Study Information Sheet for the test. LM to arrive 15 minutes early, and that it is imperative to arrive on time for appointment to keep from having the test rescheduled. If you need to cancel or reschedule your appointment, please call the office within 24 hours of your appointment. Failure to do so may result in a cancellation of your appointment, and a $50 no show fee. Phone number given for call back for any questions.Thomas Wright

## 2017-04-11 ENCOUNTER — Other Ambulatory Visit: Payer: Self-pay

## 2017-04-11 ENCOUNTER — Ambulatory Visit (HOSPITAL_BASED_OUTPATIENT_CLINIC_OR_DEPARTMENT_OTHER): Payer: Medicare HMO

## 2017-04-11 ENCOUNTER — Ambulatory Visit (HOSPITAL_COMMUNITY): Payer: Medicare HMO | Attending: Cardiology

## 2017-04-11 DIAGNOSIS — Z01818 Encounter for other preprocedural examination: Secondary | ICD-10-CM | POA: Diagnosis not present

## 2017-04-11 DIAGNOSIS — R0602 Shortness of breath: Secondary | ICD-10-CM

## 2017-04-11 DIAGNOSIS — R079 Chest pain, unspecified: Secondary | ICD-10-CM | POA: Insufficient documentation

## 2017-04-11 DIAGNOSIS — I10 Essential (primary) hypertension: Secondary | ICD-10-CM | POA: Diagnosis not present

## 2017-04-11 DIAGNOSIS — Z954 Presence of other heart-valve replacement: Secondary | ICD-10-CM | POA: Diagnosis not present

## 2017-04-11 DIAGNOSIS — Z953 Presence of xenogenic heart valve: Secondary | ICD-10-CM | POA: Diagnosis not present

## 2017-04-11 DIAGNOSIS — E119 Type 2 diabetes mellitus without complications: Secondary | ICD-10-CM | POA: Insufficient documentation

## 2017-04-11 DIAGNOSIS — I080 Rheumatic disorders of both mitral and aortic valves: Secondary | ICD-10-CM | POA: Diagnosis not present

## 2017-04-11 DIAGNOSIS — I4891 Unspecified atrial fibrillation: Secondary | ICD-10-CM | POA: Insufficient documentation

## 2017-04-11 DIAGNOSIS — E785 Hyperlipidemia, unspecified: Secondary | ICD-10-CM | POA: Diagnosis not present

## 2017-04-11 DIAGNOSIS — R5383 Other fatigue: Secondary | ICD-10-CM | POA: Insufficient documentation

## 2017-04-11 LAB — MYOCARDIAL PERFUSION IMAGING
CHL CUP NUCLEAR SSS: 6
CSEPPHR: 77 {beats}/min
LVDIAVOL: 276 mL (ref 62–150)
LVSYSVOL: 191 mL
RATE: 0.4
Rest HR: 71 {beats}/min
SDS: 3
SRS: 3
TID: 1.05

## 2017-04-11 MED ORDER — PERFLUTREN LIPID MICROSPHERE
1.0000 mL | INTRAVENOUS | Status: AC | PRN
  Administered 2017-04-11: 2 mL via INTRAVENOUS

## 2017-04-11 MED ORDER — TECHNETIUM TC 99M TETROFOSMIN IV KIT
32.8000 | PACK | Freq: Once | INTRAVENOUS | Status: AC | PRN
  Administered 2017-04-11: 32.8 via INTRAVENOUS
  Filled 2017-04-11: qty 33

## 2017-04-11 MED ORDER — TECHNETIUM TC 99M TETROFOSMIN IV KIT
10.8000 | PACK | Freq: Once | INTRAVENOUS | Status: AC | PRN
  Administered 2017-04-11: 10.8 via INTRAVENOUS
  Filled 2017-04-11: qty 11

## 2017-04-11 MED ORDER — REGADENOSON 0.4 MG/5ML IV SOLN
0.4000 mg | Freq: Once | INTRAVENOUS | Status: AC
  Administered 2017-04-11: 0.4 mg via INTRAVENOUS

## 2017-04-12 ENCOUNTER — Encounter: Payer: Self-pay | Admitting: *Deleted

## 2017-04-12 LAB — ECHOCARDIOGRAM COMPLETE
HEIGHTINCHES: 72 in
Weight: 4160 oz

## 2017-04-17 ENCOUNTER — Ambulatory Visit (HOSPITAL_COMMUNITY)
Admission: RE | Admit: 2017-04-17 | Discharge: 2017-04-17 | Disposition: A | Discharge status: Home / Self Care | Payer: Medicare HMO | Source: Ambulatory Visit | Attending: Cardiology | Admitting: Cardiology

## 2017-04-17 ENCOUNTER — Encounter: Payer: Medicare HMO | Admitting: Vascular Surgery

## 2017-04-17 ENCOUNTER — Encounter (HOSPITAL_COMMUNITY): Payer: Self-pay | Admitting: *Deleted

## 2017-04-17 ENCOUNTER — Encounter (HOSPITAL_COMMUNITY)
Admission: RE | Disposition: A | Discharge status: Home / Self Care | Payer: Self-pay | Source: Ambulatory Visit | Attending: Cardiology

## 2017-04-17 ENCOUNTER — Ambulatory Visit (HOSPITAL_BASED_OUTPATIENT_CLINIC_OR_DEPARTMENT_OTHER)
Admission: RE | Admit: 2017-04-17 | Discharge: 2017-04-17 | Disposition: A | Discharge status: Home / Self Care | Payer: Medicare HMO | Source: Ambulatory Visit | Attending: Cardiology | Admitting: Cardiology

## 2017-04-17 DIAGNOSIS — Z89511 Acquired absence of right leg below knee: Secondary | ICD-10-CM | POA: Insufficient documentation

## 2017-04-17 DIAGNOSIS — I429 Cardiomyopathy, unspecified: Secondary | ICD-10-CM | POA: Insufficient documentation

## 2017-04-17 DIAGNOSIS — Z794 Long term (current) use of insulin: Secondary | ICD-10-CM | POA: Diagnosis not present

## 2017-04-17 DIAGNOSIS — I2510 Atherosclerotic heart disease of native coronary artery without angina pectoris: Secondary | ICD-10-CM | POA: Insufficient documentation

## 2017-04-17 DIAGNOSIS — Z7901 Long term (current) use of anticoagulants: Secondary | ICD-10-CM | POA: Diagnosis not present

## 2017-04-17 DIAGNOSIS — Z953 Presence of xenogenic heart valve: Secondary | ICD-10-CM | POA: Diagnosis not present

## 2017-04-17 DIAGNOSIS — Z79899 Other long term (current) drug therapy: Secondary | ICD-10-CM | POA: Diagnosis not present

## 2017-04-17 DIAGNOSIS — I5042 Chronic combined systolic (congestive) and diastolic (congestive) heart failure: Secondary | ICD-10-CM | POA: Diagnosis not present

## 2017-04-17 DIAGNOSIS — I5032 Chronic diastolic (congestive) heart failure: Secondary | ICD-10-CM | POA: Diagnosis present

## 2017-04-17 DIAGNOSIS — I480 Paroxysmal atrial fibrillation: Secondary | ICD-10-CM | POA: Diagnosis not present

## 2017-04-17 DIAGNOSIS — I6523 Occlusion and stenosis of bilateral carotid arteries: Secondary | ICD-10-CM | POA: Insufficient documentation

## 2017-04-17 DIAGNOSIS — E119 Type 2 diabetes mellitus without complications: Secondary | ICD-10-CM | POA: Diagnosis not present

## 2017-04-17 DIAGNOSIS — E669 Obesity, unspecified: Secondary | ICD-10-CM | POA: Diagnosis not present

## 2017-04-17 DIAGNOSIS — I340 Nonrheumatic mitral (valve) insufficiency: Secondary | ICD-10-CM

## 2017-04-17 DIAGNOSIS — I358 Other nonrheumatic aortic valve disorders: Secondary | ICD-10-CM | POA: Diagnosis not present

## 2017-04-17 DIAGNOSIS — M1990 Unspecified osteoarthritis, unspecified site: Secondary | ICD-10-CM | POA: Diagnosis not present

## 2017-04-17 HISTORY — PX: TEE WITHOUT CARDIOVERSION: SHX5443

## 2017-04-17 LAB — GLUCOSE, CAPILLARY: Glucose-Capillary: 97 mg/dL (ref 65–99)

## 2017-04-17 SURGERY — ECHOCARDIOGRAM, TRANSESOPHAGEAL
Anesthesia: Moderate Sedation

## 2017-04-17 MED ORDER — FENTANYL CITRATE (PF) 100 MCG/2ML IJ SOLN
INTRAMUSCULAR | Status: AC
  Filled 2017-04-17: qty 2

## 2017-04-17 MED ORDER — FENTANYL CITRATE (PF) 100 MCG/2ML IJ SOLN
INTRAMUSCULAR | Status: DC | PRN
  Administered 2017-04-17: 25 ug via INTRAVENOUS

## 2017-04-17 MED ORDER — MIDAZOLAM HCL 10 MG/2ML IJ SOLN
INTRAMUSCULAR | Status: DC | PRN
  Administered 2017-04-17: 2 mg via INTRAVENOUS
  Administered 2017-04-17: 1.5 mg via INTRAVENOUS

## 2017-04-17 MED ORDER — MIDAZOLAM HCL 5 MG/ML IJ SOLN
INTRAMUSCULAR | Status: AC
  Filled 2017-04-17: qty 2

## 2017-04-17 MED ORDER — BUTAMBEN-TETRACAINE-BENZOCAINE 2-2-14 % EX AERO
INHALATION_SPRAY | CUTANEOUS | Status: DC | PRN
  Administered 2017-04-17: 2 via TOPICAL

## 2017-04-17 MED ORDER — SODIUM CHLORIDE 0.9 % IV SOLN
INTRAVENOUS | Status: DC
  Administered 2017-04-17: 500 mL via INTRAVENOUS

## 2017-04-17 NOTE — Progress Notes (Signed)
  Echocardiogram Echocardiogram Transesophageal has been performed.  Donata Clay 04/17/2017, 3:41 PM

## 2017-04-17 NOTE — H&P (View-Only) (Signed)
  Cardiology Office Note   Date:  04/01/2017   ID:  Thomas Wright, DOB 12/19/1945, MRN [id number]  PCP:  Barnes, Elizabeth, MD  Cardiologist:   Leonie Amacher, MD       History of Present Illness: Thomas Wright is a 71 y.o. 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 Chris Wells in [place] Cors looked good, nonvalvular atrial fibrillation, diabetes difficult to control who presents for follow-up of diastolic heart failure, chronic, hospitalization in early August 2016 with 20 pound weight gain. Interestingly, his BNP was normal. No changes on chest CT scan to suggest amiodarone toxicity. He was down 3.4 L at discharge and pulmonary function studies demonstrated moderate to severe restriction consistent with fibrosis or interstitial inflammations. Pulmonary referral was made.  Amputation BKA. Dr. Duda, Small cut at home which then led to gangrene   I reviewed pulmonary note. There was significant exposure in the 60s and 70s to coal dust. 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 Autoimmune serologies were drawn. Sedimentation rate was normal at 11.  Previously he had battled with hypotension at home SBPs 50s-60s.  Lightheaded but no syncope, chest pain, palpitations, dyspnea.   He called in and was advised to decrease Coreg to 6.25mg BID.  He has only been taking this when SBP > 125 and DBP > 85 he takes Coreg.  He has only been taking Coreg twice daily because BPs have been mostly lower than this.    His weight has fluctuated.  He reports abdominal distention but denies PND or LE edema.  He has stable 2 pillow orthopnea.  He reports early satiety in past year.  He doubles his Lasix about twice weekly when he feels his fluid is up (when it is difficult to button his pants).     Aortic valve was placed in 05/2007 with a 29 mm bioprosthetic Edwards valve. This was  performed in [place]. At the time his ejection fraction was 30% on cardiac catheterization however current ejection fraction appears to be 45% from 12/24/11 with aortic root dilatation of 4.5 cm.  He has been on amiodarone since his surgery. Previously had stopped it in 2013. 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 His atrial fibrillation came with a finger infection exacerbated by uncontrolled diabetes. He has had history as well of diabetic ketoacidosis, perirectal abscess.  09/26/16  - no strength, moderate. Fatigued. Weak. Thomas Wright from [place] moved in. PT not helping him. Felt a bit depressed during the holidays. No significant chest pain or shortness of breath. Right below-knee amputation, getting around. Last step at home is a half an inch higher and is somewhat of a challenge for him. Thomas Wright is going to set him up with Silver sneakers.   04/01/17  - He still having fatigue. Shortness of breath with activity. No chest pain. 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 He has a right shoulder surgery planned and prior to anesthesia I would like to obtain a stress test and echocardiogram. No syncope, no bleeding  Past Medical History:  Diagnosis Date  . Arthritis    .  . Cardiomyopathy (HCC)    EF originally 20%-now 45%, no CAD on cath  . Chronic combined systolic and diastolic CHF (congestive heart failure) (HCC)    a.   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 Reported h/o cath in 2008 without significant CAD.  . Depression    "a little right now" (04/19/2015)  . Hyperlipidemia   . Hypertension   . Hypertriglyceridemia   . PAF (paroxysmal atrial fibrillation) (HCC)    a. Postop 2008. b. recurrent afib 08/2014 with LAA clot noted on TEE. EF was down again  at 25%. He was anticoagulated with Eliquis and placed on Amiodarone.He ultimately underwent TEE-DCCV 08/2014.  . S/P aortic valve replacement with bioprosthetic valve    a. bioprosthetic AVR in 05/2007 in [place].  . Type II diabetes mellitus (HCC)     Past Surgical History:  Procedure Laterality Date  . AMPUTATION Right 07/29/2014   Procedure: AMPUTATION RIGHT LONG FINGER;  Surgeon: Matthew [name], MD;  Location: [facility] OR;  Service: Orthopedics;  Laterality: Right;  . AMPUTATION Right 01/13/2016   Procedure: RIGHT BELOW KNEE AMPUTATION;  Surgeon: Marcus Duda V, MD;  Location: [facility] OR;  Service: Orthopedics;  Laterality: Right;  . AORTIC VALVE REPLACEMENT  05/2007   with tissue graft  . APPLICATION OF WOUND VAC Right 01/13/2016   Procedure: APPLICATION OF WOUND VAC;  Surgeon: Marcus Duda V, MD;  Location: [facility] OR;  Service: Orthopedics;  Laterality: Right;  . CARDIAC CATHETERIZATION  "several"  . CARDIAC VALVE REPLACEMENT    . CARDIOVERSION N/A 07/23/2014   Procedure: CARDIOVERSION;  Surgeon: Peter C Nishan, MD;  Location: [facility];  Service: Cardiovascular;  Laterality: N/A;  . CARDIOVERSION N/A 09/01/2014   Procedure: CARDIOVERSION;  Surgeon: Azelea Seguin, MD;  Location: [facility];  Service: Cardiovascular;  Laterality: N/A;  . I&D EXTREMITY Right 05/05/2014   Procedure: IRRIGATION AND DEBRIDEMENT EXTREMITY;  Surgeon: Matthew [name], MD;  Location: [facility];  Service: Orthopedics;  Laterality: Right;  . TEE WITHOUT CARDIOVERSION N/A 07/23/2014   Procedure: TRANSESOPHAGEAL ECHOCARDIOGRAM (TEE);  Surgeon: Peter C Nishan, MD;  Location: [facility];  Service: Cardiovascular;  Laterality: N/A;  . TEE WITHOUT CARDIOVERSION N/A 09/01/2014   Procedure: TRANSESOPHAGEAL ECHOCARDIOGRAM (TEE);  Surgeon: Elbony Mcclimans, MD;  Location: [facility];  Service: Cardiovascular;  Laterality: N/A;  . TONSILLECTOMY  1954     Current Outpatient Prescriptions  Medication Sig Dispense Refill  . ACCU-CHEK  SMARTVIEW test strip USE AS INSTRUCTED TO CHECK BLOOD SUGAR FOUR TIMES DAILY 400 each 0  . amiodarone (PACERONE) 100 MG tablet Take 1 tablet (100 mg total) by mouth daily. 90 tablet 1  . apixaban (ELIQUIS) 5 MG TABS tablet Take 1 tablet (5 mg total) by mouth 2 (two) times daily. 180 tablet 2  . atorvastatin (LIPITOR) 10 MG tablet Take 1 tablet (10 mg total) by mouth every morning. 90 tablet 2  . Blood Glucose Monitoring Suppl (ACCU-CHEK NANO SMARTVIEW) w/Device KIT USE TO CHECK BLOOD SUGAR FOUR TIMES PER DAY (NEEDS APPOINTMENT) 1 kit 0  . carvedilol (COREG) 6.25 MG tablet Take 6.25 mg by mouth 2 (two) times daily with a meal.    . DHA-EPA-Coenzyme Q10-Vitamin E (CO Q-10 VITAMIN E FISH OIL PO) Take 1,000 mg by mouth 2 (two) times daily.    . furosemide (LASIX) 40 MG tablet Take 1 tablet (40 mg total) by mouth daily. 30 tablet 1  . Insulin Pen Needle 31G X 5 MM MISC 1 each by Does not apply route 4 (four) times daily -  before meals and at bedtime. 300 each 0  . Multiple Vitamin (  MULTIVITAMIN) tablet Take 1 tablet by mouth daily.    . NOVOLIN R 100 UNIT/ML injection INJECT  6 UNITS BEFORE BREAKFAST  AND  LUNCH  AND  8 UNITS BEFORE SUPPER (DISCARD AND BEGIN A NEW VIAL AFTER 42 DAYS) 20 mL 3   No current facility-administered medications for this visit.     Allergies:   Amoxicill-clarithro-omeprazole    Social History:  The patient  reports that he has never smoked. He has never used smokeless tobacco. He reports that he drinks alcohol. He reports that he does not use drugs.   Family History:  The patient's family history includes Colon cancer in his father; Congenital heart disease in his other; Diabetes in his father and maternal grandmother; Heart disease in his maternal grandfather; Heart murmur in his child; Liver disease in his brother.    ROS:  Please see the history of present illness.   Otherwise, review of systems are positive for none.   All other systems are reviewed and negative.     PHYSICAL EXAM: VS:  BP 138/70   Pulse 67   Ht 6' (1.829 m)   Wt 260 lb 6.4 oz (118.1 kg)   BMI 35.32 kg/m  , BMI Body mass index is 35.32 kg/m. GEN: Well nourished, well developed, in no acute distress  HEENT: normal  Neck: no JVD, carotid bruits, or masses Cardiac: RRR; no murmurs, rubs, or gallops,no edema  Respiratory:  clear to auscultation bilaterally, normal work of breathing GI: soft, nontender, mild distended, + BS MS: no deformity or atrophy, RIGHT BKA Skin: warm and dry, no rash Neuro:  Grossly intact Psych: euthymic mood, full affect No change  EKG: Today 04/01/17-sinus rhythm first degree AV block and left bundle branch block heart rate 67 bpm. No change from prior in 2017.    Recent Labs: 09/26/2016: ALT 14; BUN 21; Creatinine, Ser 1.32; Hemoglobin 15.4; Platelets 273; Potassium 4.7; Sodium 134; TSH 2.270    Lipid Panel    Component Value Date/Time   CHOL 127 05/24/2015 0848   TRIG 123.0 05/24/2015 0848   HDL 38.70 (L) 05/24/2015 0848   CHOLHDL 3 05/24/2015 0848   VLDL 24.6 05/24/2015 0848   LDLCALC 64 05/24/2015 0848      Wt Readings from Last 3 Encounters:  04/01/17 260 lb 6.4 oz (118.1 kg)  02/19/17 248 lb (112.5 kg)  02/14/17 240 lb (108.9 kg)      Other studies Reviewed: Additional studies/ records that were reviewed today include: labs, radiology, office notes. Review of the above records demonstrates: as above   ECHO 07/22/14 - EF 25-30% (setting of AFIB RVR)  ECHO 12/08/13: - 45% to 50%.  - Aortic valve: A bioprosthesis was present. Trivial regurgitation. - Aortic root: The aortic root was moderately dilated. - Mitral valve: Calcified annulus. - Left atrium: The atrium was moderately dilated. Impressions:  ECHO 04/19/15: - Left ventricle: The cavity size was normal. Systolic function was  normal. The estimated ejection fraction was in the range of 50%  to 55%. Wall motion was normal; there were no regional wall  motion  abnormalities. Doppler parameters are consistent with  abnormal left ventricular relaxation (grade 1 diastolic  dysfunction). Doppler parameters are consistent with elevated  ventricular end-diastolic filling pressure. - Ventricular septum: Septal motion showed paradox. - Aortic valve: Bioprosthetic valve sits well in the aortic  position. There is mild central aortic regurgitation and no  paravalvular leak. Normal transaortic gradients. Transvalvular  velocity was within the normal range.   There was no stenosis.  There was mild regurgitation. Valve area (VTI): 3.58 cm^2. Valve  area (Vmax): 3.57 cm^2. Valve area (Vmean): 3.24 cm^2. - Mitral valve: Calcified annulus. Mildly thickened leaflets .  There was mild regurgitation. Valve area by continuity equation  (using LVOT flow): 4 cm^2. - Left atrium: The atrium was mildly dilated. - Right ventricle: The cavity size was mildly dilated. Wall  thickness was normal. - Right atrium: The atrium was mildly dilated. - Pulmonary arteries: Systolic pressure was within the normal  range.  Impressions:  - There is no significant difference when compared tio the prior  study from 11/22/2014.  CTA 12/14/13 -  Mild dilatation of the proximal aorta at the sinuses of Valsalva, measuring 4.2 cm in estimated diameter. The rest of the thoracic aorta is of normal caliber. No dissection is identified. Scattered calcified plaque is identified in the distribution of the LAD.   Nuclear stress test 11/2012-no ischemia, ejection fraction 42%  ASSESSMENT AND PLAN:  1.  Chronic diastolic heart failure-No coronary artery disease on catheterization in 2009. Ejection fraction on 04/2015 echocardiogram was now low normal 50%. Previously in the setting of atrial fibrillation with rapid ventricular response, ejection fraction was as low as 20-25%. interestingly, his dyspnea did not change all that much in the hospital after diuresing 3-4 L. His BNP on  admission was actually normal. Weight gain is the only indicator of potential excess fluid. However, he has no other S&S of HF.  We will continue his Lasix. He is unsure if hypotension occurred with increased Lasix so will continue with daily Lasix but he can increase to BID prn.  Continue lower dose of Coreg 6.25mg daily since he feels better with this change. No major changes made.  2. Paroxysmal atrial fibrillation, nonvalvular- In NSR. Decrease amiodarone from 200mg daily to 100 daily. Last episode of atrial fibrillation occurred in 2015 in the setting of finger infection. At that time, cardioversion was delayed because of possible left atrial appendage thrombus. Back in 2013, his amiodarone was stopped because of several years of normal sinus rhythm detected postoperatively, bioprosthetic aortic valve replacement. Pulmonary did not feel as though his CT findings or lab work (ESR normal) were indicative of amiodarone toxicity causing his dyspnea. We will continue. He does well in normal rhythm. LFT, normal. TSH. Continue to monitor with amiodarone.  3. Obesity-continue to encourage weight loss. Challenging for him without right lower extremity.  4. 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 Checking echo  5. 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  6. Difficult to control diabetes-has had perirectal abscess, foot ulcer, this resulted in right below-knee amputation. Seeing endocrinology.  7. Bioprosthetic aortic valve-replaced in 2009. Echo reviewed as above. Reassuring.  8. Chronic anticoagulation-Eliquis 5 mg twice a day. Given his hemoglobin of 9.3, we will discontinue his aspirin 81mg. Continue with Eliquis only.   9. Status post right lower extremity amputation  10. Coronary artery calcification - remember, prior to his aortic valve replacement 10 years ago he did not have any significant CAD on cardiac catheterization. I will however  check a pharmacologic stress test to ensure that he does not have any high risk lesions. This will be good prior to his shoulder surgery.  11. 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 I will send him to Dr. Hendrickson to further review. He may just require CT surveillance.  12. Carotid artery disease - less than 50% bilaterally. He has a   referral in for Dr. Dickson.     Current medicines are reviewed at length with the patient today.  The patient does not have concerns regarding medicines.  The following changes have been made:  Decrease amiodarone  Labs/ tests ordered today include:   Orders Placed This Encounter  Procedures  . Ambulatory referral to Cardiothoracic Surgery  . Myocardial Perfusion Imaging  . EKG 12-Lead  . ECHOCARDIOGRAM COMPLETE     Disposition:   FU with Thomas Wright in 6 months  Signed, Geneva Barrero, MD  04/01/2017 4:47 PM    [facility] [address], [place], [place]  [address] Phone: [phone]; Fax: [phone]  

## 2017-04-17 NOTE — Interval H&P Note (Signed)
History and Physical Interval Note:  04/17/2017 9:11 AM  Thomas Wright  has presented today for surgery, with the diagnosis of LOW ES  The various methods of treatment have been discussed with the patient and family. After consideration of risks, benefits and other options for treatment, the patient has consented to  Procedure(s): TRANSESOPHAGEAL ECHOCARDIOGRAM (TEE) (N/A) as a surgical intervention .  The patient's history has been reviewed, patient examined, no change in status, stable for surgery.  I have reviewed the patient's chart and labs.  Questions were answered to the patient's satisfaction.     [employer]

## 2017-04-17 NOTE — Discharge Instructions (Signed)
Transesophageal Echocardiogram °Transesophageal echocardiography (TEE) is a picture test of your heart using sound waves. The pictures taken can give very detailed pictures of your heart. This can help your doctor see if there are problems with your heart. TEE can check: °· If your heart has blood clots in it. °· How well your heart valves are working. °· If you have an infection on the inside of your heart. °· Some of the major arteries of your heart. °· If your heart valve is working after a repair. °· Your heart before a procedure that uses a shock to your heart to get the rhythm back to normal. ° °What happens before the procedure? °· Do not eat or drink for 6 hours before the procedure or as told by your doctor. °· Make plans to have someone drive you home after the procedure. Do not drive yourself home. °· An IV tube will be put in your arm. °What happens during the procedure? °· You will be given a medicine to help you relax (sedative). It will be given through the IV tube. °· A numbing medicine will be sprayed or gargled in the back of your throat to help numb it. °· The tip of the probe is placed into the back of your mouth. You will be asked to swallow. This helps to pass the probe into your esophagus. °· Once the tip of the probe is in the right place, your doctor can take pictures of your heart. °· You may feel pressure at the back of your throat. °What happens after the procedure? °· You will be taken to a recovery area so the sedative can wear off. °· Your throat may be sore and scratchy. This will go away slowly over time. °· You will go home when you are fully awake and able to swallow liquids. °· You should have someone stay with you for the next 24 hours. °· Do not drive or operate machinery for the next 24 hours. °This information is not intended to replace advice given to you by your health care provider. Make sure you discuss any questions you have with your health care provider. °Document  Released: 07/01/2009 Document Revised: 02/09/2016 Document Reviewed: 03/05/2013 °Elsevier Interactive Patient Education © 2018 [employer] ° °

## 2017-04-17 NOTE — Progress Notes (Deleted)
  Echocardiogram 2D Echocardiogram has been performed.  Donata Clay 04/17/2017, 11:07 AM

## 2017-04-20 ENCOUNTER — Encounter (HOSPITAL_COMMUNITY): Payer: Self-pay | Admitting: Cardiology

## 2017-04-23 ENCOUNTER — Institutional Professional Consult (permissible substitution) (INDEPENDENT_AMBULATORY_CARE_PROVIDER_SITE_OTHER): Payer: Medicare HMO | Admitting: Thoracic Surgery (Cardiothoracic Vascular Surgery)

## 2017-04-23 ENCOUNTER — Encounter: Payer: Self-pay | Admitting: Thoracic Surgery (Cardiothoracic Vascular Surgery)

## 2017-04-23 ENCOUNTER — Other Ambulatory Visit: Payer: Self-pay | Admitting: *Deleted

## 2017-04-23 VITALS — BP 128/66 | HR 61 | Resp 18 | Ht 72.0 in | Wt 260.0 lb

## 2017-04-23 DIAGNOSIS — Z952 Presence of prosthetic heart valve: Secondary | ICD-10-CM | POA: Diagnosis not present

## 2017-04-23 DIAGNOSIS — J9859 Other diseases of mediastinum, not elsewhere classified: Secondary | ICD-10-CM

## 2017-04-23 NOTE — Progress Notes (Signed)
PCP is Leighton Ruff, MD Referring Provider is Jerline Pain, MD  Chief Complaint  Patient presents with  . Mediastinal Mass    /cystic structure per CT CHEST 02/26/17...ECHO 7/26, TEE 8/1, PFT 6/22..s/p AVR 05/28/07 in [place]    HPI: [name] is sent for consultation regarding an anterior mediastinal mass  Thomas Wright is a 71 yo man with a complex medical history. His past history is significant for aortic valve replacement with bioprosthetic valve in 2008, paroxysmal atrial fibrillation, chronic combined systolic and diastolic congestive heart failure with ejection fraction of 20-25%, cardiomyopathy, hypertension, hyperlipidemia, type 2 diabetes, arthritis, and right below knee amputation. He has chronic dyspnea. He was worked up with a high-resolution chest CT. It showed no evidence of interstitial lung disease. He was noted to have a 1.7 cm anterior mediastinal "mass." Reviewing old CTs that mass was present previously but had grown in the 2 year interim.  He was being worked up for right shoulder surgery and was sent for cardiology clearance. He saw Dr. Marlou Porch. A transesophageal echo showed an ejection fraction of 20-25% with diffuse hypokinesis. The aortic prosthesis showed some calcification but no limitation of motion of the leaflets and there was trivial regurgitation. Stress test showed an ejection fraction of 31%, there was no ST deviation with stress and no evidence of ischemia.  Thomas Wright continues to get short of breath with exertion. There are 9 steps into his house. He has to stop after the eighth step to catch his breath.. He has swelling in his left leg. He denies double vision or eyelid drooping. He has severe right shoulder pain.   Past Medical History:  Diagnosis Date  . Arthritis    .  Thomas Wright Cardiomyopathy ([place])    EF originally 20%-now 45%, no CAD on cath  . Chronic combined systolic and diastolic CHF (congestive heart failure) ([place])    a. Fluctuating EF - 30% at  time of AVR, 45% in 2013, and 55-60% after restoration of NSR in 11/2014 - suspected NICM. Reported h/o cath in 2008 without significant CAD.  Thomas Wright Depression    "a little right now" (04/19/2015)  . Hyperlipidemia   . Hypertension   . Hypertriglyceridemia   . PAF (paroxysmal atrial fibrillation) ([place])    a. Postop 2008. b. recurrent afib 08/2014 with LAA clot noted on TEE. EF was down again at 25%. He was anticoagulated with Eliquis and placed on Amiodarone.He ultimately underwent TEE-DCCV 08/2014.  . S/P aortic valve replacement with bioprosthetic valve    a. bioprosthetic AVR in 05/2007 in [place].  . Type II diabetes mellitus ([place])     Past Surgical History:  Procedure Laterality Date  . AMPUTATION Right 07/29/2014   Procedure: AMPUTATION RIGHT LONG FINGER;  Surgeon: [name], MD;  Location: [facility];  Service: Orthopedics;  Laterality: Right;  . AMPUTATION Right 01/13/2016   Procedure: RIGHT BELOW KNEE AMPUTATION;  Surgeon: Newt Minion, MD;  Location: [facility];  Service: Orthopedics;  Laterality: Right;  . AORTIC VALVE REPLACEMENT  05/2007   with tissue graft  . APPLICATION OF WOUND VAC Right 01/13/2016   Procedure: APPLICATION OF WOUND VAC;  Surgeon: Newt Minion, MD;  Location: [place];  Service: Orthopedics;  Laterality: Right;  . CARDIAC CATHETERIZATION  "several"  . CARDIAC VALVE REPLACEMENT    . CARDIOVERSION N/A 07/23/2014   Procedure: CARDIOVERSION;  Surgeon: Josue Hector, MD;  Location: [facility];  Service: Cardiovascular;  Laterality: N/A;  . CARDIOVERSION N/A  09/01/2014   Procedure: CARDIOVERSION;  Surgeon: [name], MD;  Location[facility] ENDOSCOPY;  Service: Cardiovascular;  Laterality: N/A;  . I&D EXTREMITY Right 05/05/2014   Procedure: IRRIGATION AND DEBRIDEMENT EXTREMITY;  Surgeon: [name], MD;  Location: [place];  Service: Orthopedics;  Laterality: Right;  . TEE WITHOUT CARDIOVERSION N/A 07/23/2014   Procedure: TRANSESOPHAGEAL ECHOCARDIOGRAM (TEE);  Surgeon:  Josue Hector, MD;  Location: [facility];  Service: Cardiovascular;  Laterality: N/A;  . TEE WITHOUT CARDIOVERSION N/A 09/01/2014   Procedure: TRANSESOPHAGEAL ECHOCARDIOGRAM (TEE);  Surgeon: [name], MD;  Location: [facility] ENDOSCOPY;  Service: Cardiovascular;  Laterality: N/A;  . TEE WITHOUT CARDIOVERSION N/A 04/17/2017   Procedure: TRANSESOPHAGEAL ECHOCARDIOGRAM (TEE);  Surgeon: Jerline Pain, MD;  Location: [facility] ENDOSCOPY;  Service: Cardiovascular;  Laterality: N/A;  . TONSILLECTOMY  1954    Family History  Problem Relation Age of Onset  . Colon cancer Father   . Diabetes Father   . Liver disease Brother   . Heart murmur Child   . Congenital heart disease Other   . Diabetes Maternal Grandmother   . Heart disease Maternal Grandfather        A Fib    Social History Social History  Substance Use Topics  . Smoking status: Never Smoker  . Smokeless tobacco: Never Used  . Alcohol use 0.0 oz/week     Comment: 04/19/2015 "might have a beer or 2, 2-3 times/yr"    Current Outpatient Prescriptions  Medication Sig Dispense Refill  . ACCU-CHEK SMARTVIEW test strip USE AS INSTRUCTED TO CHECK BLOOD SUGAR FOUR TIMES DAILY 400 each 0  . amiodarone (PACERONE) 100 MG tablet Take 1 tablet (100 mg total) by mouth daily. 90 tablet 1  . apixaban (ELIQUIS) 5 MG TABS tablet Take 1 tablet (5 mg total) by mouth 2 (two) times daily. 180 tablet 2  . atorvastatin (LIPITOR) 10 MG tablet Take 1 tablet (10 mg total) by mouth every morning. 90 tablet 2  . Blood Glucose Monitoring Suppl (ACCU-CHEK NANO SMARTVIEW) w/Device KIT USE TO CHECK BLOOD SUGAR FOUR TIMES PER DAY (NEEDS APPOINTMENT) 1 kit 0  . carvedilol (COREG) 6.25 MG tablet Take 6.25 mg by mouth 2 (two) times daily with a meal.    . DHA-EPA-Coenzyme Q10-Vitamin E (CO Q-10 VITAMIN E FISH OIL PO) Take 1,000 mg by mouth 2 (two) times daily.    . furosemide (LASIX) 40 MG tablet Take 1 tablet (40 mg total) by mouth daily. 30 tablet 1  . Insulin Pen Needle 31G  X 5 MM MISC 1 each by Does not apply route 4 (four) times daily -  before meals and at bedtime. 300 each 0  . Multiple Vitamin (MULTIVITAMIN) tablet Take 1 tablet by mouth daily.    Thomas Wright NOVOLIN R 100 UNIT/ML injection INJECT  6 UNITS BEFORE BREAKFAST  AND  LUNCH  AND  8 UNITS BEFORE SUPPER (DISCARD AND BEGIN A NEW VIAL AFTER 42 DAYS) 20 mL 3   No current facility-administered medications for this visit.     Allergies  Allergen Reactions  . Amoxicill-Clarithro-Omeprazole Diarrhea and Nausea And Vomiting    Review of Systems  Constitutional: Positive for activity change. Negative for appetite change and unexpected weight change.  HENT: Negative for trouble swallowing and voice change.   Eyes: Negative for visual disturbance.       No double vision  Respiratory: Positive for shortness of breath. Negative for chest tightness and wheezing.   Cardiovascular: Positive for leg swelling. Negative for chest  pain and palpitations.  Gastrointestinal: Negative for abdominal pain and blood in stool.  Genitourinary: Positive for frequency. Negative for difficulty urinating.  Musculoskeletal: Positive for arthralgias and joint swelling.  Neurological: Negative for seizures, syncope and weakness.  Hematological: Negative for adenopathy. Does not bruise/bleed easily.    BP 128/66 (BP Location: Right Arm, Patient Position: Sitting, Cuff Size: Large)   Pulse 61   Resp 18   Ht 6' (1.829 m)   Wt 260 lb (117.9 kg)   SpO2 96% Comment: ON RA  BMI 35.26 kg/m  Physical Exam  Constitutional: He is oriented to person, place, and time. No distress.  HENT:  Head: Normocephalic and atraumatic.  Eyes: Conjunctivae and EOM are normal. No scleral icterus.  Neck: Neck supple. No thyromegaly present.  Cardiovascular: Normal rate and regular rhythm.  Exam reveals no gallop.   Murmur (2/6 systolic) heard. Pulmonary/Chest: Effort normal. No respiratory distress. He has no wheezes. He has no rales.  Abdominal: Soft.  He exhibits no distension. There is no tenderness.  Musculoskeletal: He exhibits edema (3-4+ nonpitting) and deformity (right BKA).  Lymphadenopathy:    He has no cervical adenopathy.  Neurological: He is alert and oriented to person, place, and time. No cranial nerve deficit.  Skin: Skin is warm and dry.  Vitals reviewed.    Diagnostic Tests: CT CHEST WITHOUT CONTRAST  TECHNIQUE: Multidetector CT imaging of the chest was performed following the standard protocol without intravenous contrast. High resolution imaging of the lungs, as well as inspiratory and expiratory imaging, was performed.  COMPARISON:  01/30/2016 chest radiograph.  FINDINGS: Cardiovascular: Normal heart size. No significant pericardial fluid/thickening. 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 Left anterior descending, left circumflex and right coronary atherosclerosis. Atherosclerotic nonaneurysmal thoracic aorta. Normal caliber pulmonary arteries.  Mediastinum/Nodes: Dominant hypodense 1.4 cm right thyroid lobe nodule near the isthmus. Unremarkable esophagus. No axillary adenopathy. Cystic 1.7 x 1.3 cm high anterior mediastinal structure (series 4/ image 39), increased mildly from 1.3 x 1.0 cm on 04/20/2015 and 1.0 x 0.9 cm on 08/04/2014. Otherwise no pathologically enlarged mediastinal or gross hilar nodes on this noncontrast scan.  Lungs/Pleura: No pneumothorax. No pleural effusion. No acute consolidative airspace disease, lung masses or significant pulmonary nodules. Mildly thickened parenchymal bands in both lower lobes, stable on the right and new on the left, most compatible with mild postinfectious/postinflammatory scarring. No significant air trapping on the expiration sequence. No significant regions of subpleural reticulation, ground-glass attenuation, traction bronchiectasis, architectural distortion or frank honeycombing.  Upper abdomen: Cholelithiasis.  Musculoskeletal: No  aggressive appearing focal osseous lesions. Marked thoracic spondylosis. Intact sternotomy wires.  IMPRESSION: 1. No evidence of interstitial lung disease at this time . 2. 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 3. 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 Given the small size, continued surveillance is warranted with chest CT with IV contrast in 6 months. Thoracic surgical consultation may be considered. 4. Three-vessel coronary atherosclerosis. 5. Cholelithiasis.  Aortic Atherosclerosis (ICD10-I70.0).   Electronically Signed   By: Ilona Sorrel M.D.   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 This is increased in size from about 1.3 cm in 2016. Differential diagnosis includes  thymoma, lymphoma, germ cell tumors, and ectopic thyroid. 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  I think a PET/CT would be helpful to determine whether there is any significant metabolic activity in the nodule.  He should be able to proceed with his right shoulder surgery since that is causing him significant issues. We can further address nodule in his mediastinum after that.  Plan: PET/CT Proceed with shoulder surgery if cleared by Dr. Marlou Porch I will see him back after his PET/CT to decide whether to observe or resect the anterior mediastinal nodule  Melrose Nakayama, MD [facility] [phone]

## 2017-05-02 ENCOUNTER — Ambulatory Visit (HOSPITAL_COMMUNITY): Payer: Medicare HMO

## 2017-05-03 DIAGNOSIS — E782 Mixed hyperlipidemia: Secondary | ICD-10-CM | POA: Diagnosis not present

## 2017-05-03 DIAGNOSIS — Z89511 Acquired absence of right leg below knee: Secondary | ICD-10-CM | POA: Diagnosis not present

## 2017-05-03 DIAGNOSIS — E1142 Type 2 diabetes mellitus with diabetic polyneuropathy: Secondary | ICD-10-CM | POA: Diagnosis not present

## 2017-05-03 DIAGNOSIS — E559 Vitamin D deficiency, unspecified: Secondary | ICD-10-CM | POA: Diagnosis not present

## 2017-05-03 DIAGNOSIS — E1165 Type 2 diabetes mellitus with hyperglycemia: Secondary | ICD-10-CM | POA: Diagnosis not present

## 2017-05-03 DIAGNOSIS — I110 Hypertensive heart disease with heart failure: Secondary | ICD-10-CM | POA: Diagnosis not present

## 2017-05-03 DIAGNOSIS — Z794 Long term (current) use of insulin: Secondary | ICD-10-CM | POA: Diagnosis not present

## 2017-05-03 DIAGNOSIS — I5032 Chronic diastolic (congestive) heart failure: Secondary | ICD-10-CM | POA: Diagnosis not present

## 2017-05-03 DIAGNOSIS — R809 Proteinuria, unspecified: Secondary | ICD-10-CM | POA: Diagnosis not present

## 2017-05-07 ENCOUNTER — Ambulatory Visit (HOSPITAL_COMMUNITY)
Admission: RE | Admit: 2017-05-07 | Discharge: 2017-05-07 | Disposition: A | Discharge status: Home / Self Care | Payer: Medicare HMO | Source: Ambulatory Visit | Attending: Thoracic Surgery (Cardiothoracic Vascular Surgery) | Admitting: Thoracic Surgery (Cardiothoracic Vascular Surgery)

## 2017-05-07 DIAGNOSIS — I2510 Atherosclerotic heart disease of native coronary artery without angina pectoris: Secondary | ICD-10-CM | POA: Insufficient documentation

## 2017-05-07 DIAGNOSIS — I700 Atherosclerosis of aorta: Secondary | ICD-10-CM | POA: Diagnosis not present

## 2017-05-07 DIAGNOSIS — J9859 Other diseases of mediastinum, not elsewhere classified: Secondary | ICD-10-CM | POA: Insufficient documentation

## 2017-05-07 DIAGNOSIS — N433 Hydrocele, unspecified: Secondary | ICD-10-CM | POA: Insufficient documentation

## 2017-05-07 DIAGNOSIS — J9811 Atelectasis: Secondary | ICD-10-CM | POA: Insufficient documentation

## 2017-05-07 DIAGNOSIS — K8020 Calculus of gallbladder without cholecystitis without obstruction: Secondary | ICD-10-CM | POA: Diagnosis not present

## 2017-05-07 LAB — GLUCOSE, CAPILLARY: Glucose-Capillary: 138 mg/dL — ABNORMAL HIGH (ref 65–99)

## 2017-05-07 MED ORDER — FLUDEOXYGLUCOSE F - 18 (FDG) INJECTION
12.4000 | Freq: Once | INTRAVENOUS | Status: AC | PRN
  Administered 2017-05-07: 12.4 via INTRAVENOUS

## 2017-05-14 ENCOUNTER — Inpatient Hospital Stay (HOSPITAL_COMMUNITY)
Admission: EM | Admit: 2017-05-14 | Discharge: 2017-05-18 | DRG: 391 | Disposition: A | Discharge status: Home Health | Payer: Medicare HMO | Attending: Internal Medicine | Admitting: Internal Medicine

## 2017-05-14 ENCOUNTER — Observation Stay (HOSPITAL_COMMUNITY): Payer: Medicare HMO

## 2017-05-14 ENCOUNTER — Encounter (HOSPITAL_COMMUNITY): Payer: Self-pay

## 2017-05-14 ENCOUNTER — Ambulatory Visit: Payer: Medicare HMO | Admitting: Thoracic Surgery (Cardiothoracic Vascular Surgery)

## 2017-05-14 DIAGNOSIS — K3184 Gastroparesis: Secondary | ICD-10-CM | POA: Diagnosis not present

## 2017-05-14 DIAGNOSIS — N183 Chronic kidney disease, stage 3 unspecified: Secondary | ICD-10-CM

## 2017-05-14 DIAGNOSIS — E872 Acidosis, unspecified: Secondary | ICD-10-CM

## 2017-05-14 DIAGNOSIS — E781 Pure hyperglyceridemia: Secondary | ICD-10-CM | POA: Diagnosis present

## 2017-05-14 DIAGNOSIS — R1084 Generalized abdominal pain: Secondary | ICD-10-CM | POA: Diagnosis not present

## 2017-05-14 DIAGNOSIS — I5042 Chronic combined systolic (congestive) and diastolic (congestive) heart failure: Secondary | ICD-10-CM | POA: Diagnosis present

## 2017-05-14 DIAGNOSIS — D72829 Elevated white blood cell count, unspecified: Secondary | ICD-10-CM | POA: Diagnosis present

## 2017-05-14 DIAGNOSIS — R197 Diarrhea, unspecified: Secondary | ICD-10-CM | POA: Diagnosis present

## 2017-05-14 DIAGNOSIS — Z7901 Long term (current) use of anticoagulants: Secondary | ICD-10-CM

## 2017-05-14 DIAGNOSIS — T50905A Adverse effect of unspecified drugs, medicaments and biological substances, initial encounter: Secondary | ICD-10-CM

## 2017-05-14 DIAGNOSIS — K2970 Gastritis, unspecified, without bleeding: Secondary | ICD-10-CM | POA: Diagnosis not present

## 2017-05-14 DIAGNOSIS — E1142 Type 2 diabetes mellitus with diabetic polyneuropathy: Secondary | ICD-10-CM | POA: Diagnosis present

## 2017-05-14 DIAGNOSIS — E663 Overweight: Secondary | ICD-10-CM | POA: Diagnosis present

## 2017-05-14 DIAGNOSIS — Z833 Family history of diabetes mellitus: Secondary | ICD-10-CM

## 2017-05-14 DIAGNOSIS — Z8249 Family history of ischemic heart disease and other diseases of the circulatory system: Secondary | ICD-10-CM

## 2017-05-14 DIAGNOSIS — K219 Gastro-esophageal reflux disease without esophagitis: Secondary | ICD-10-CM | POA: Diagnosis present

## 2017-05-14 DIAGNOSIS — R079 Chest pain, unspecified: Secondary | ICD-10-CM | POA: Diagnosis not present

## 2017-05-14 DIAGNOSIS — E1122 Type 2 diabetes mellitus with diabetic chronic kidney disease: Secondary | ICD-10-CM | POA: Diagnosis present

## 2017-05-14 DIAGNOSIS — K8020 Calculus of gallbladder without cholecystitis without obstruction: Secondary | ICD-10-CM | POA: Diagnosis present

## 2017-05-14 DIAGNOSIS — I130 Hypertensive heart and chronic kidney disease with heart failure and stage 1 through stage 4 chronic kidney disease, or unspecified chronic kidney disease: Secondary | ICD-10-CM | POA: Diagnosis present

## 2017-05-14 DIAGNOSIS — I200 Unstable angina: Secondary | ICD-10-CM | POA: Diagnosis not present

## 2017-05-14 DIAGNOSIS — I350 Nonrheumatic aortic (valve) stenosis: Secondary | ICD-10-CM | POA: Diagnosis not present

## 2017-05-14 DIAGNOSIS — Z794 Long term (current) use of insulin: Secondary | ICD-10-CM

## 2017-05-14 DIAGNOSIS — R0789 Other chest pain: Secondary | ICD-10-CM | POA: Diagnosis present

## 2017-05-14 DIAGNOSIS — T476X5A Adverse effect of antidiarrheal drugs, initial encounter: Secondary | ICD-10-CM | POA: Diagnosis present

## 2017-05-14 DIAGNOSIS — Z953 Presence of xenogenic heart valve: Secondary | ICD-10-CM

## 2017-05-14 DIAGNOSIS — I10 Essential (primary) hypertension: Secondary | ICD-10-CM | POA: Diagnosis not present

## 2017-05-14 DIAGNOSIS — Z6832 Body mass index (BMI) 32.0-32.9, adult: Secondary | ICD-10-CM

## 2017-05-14 DIAGNOSIS — I429 Cardiomyopathy, unspecified: Secondary | ICD-10-CM | POA: Diagnosis present

## 2017-05-14 DIAGNOSIS — E860 Dehydration: Secondary | ICD-10-CM | POA: Diagnosis present

## 2017-05-14 DIAGNOSIS — E1110 Type 2 diabetes mellitus with ketoacidosis without coma: Secondary | ICD-10-CM | POA: Diagnosis present

## 2017-05-14 DIAGNOSIS — I739 Peripheral vascular disease, unspecified: Secondary | ICD-10-CM | POA: Diagnosis not present

## 2017-05-14 DIAGNOSIS — I480 Paroxysmal atrial fibrillation: Secondary | ICD-10-CM | POA: Diagnosis present

## 2017-05-14 DIAGNOSIS — E1143 Type 2 diabetes mellitus with diabetic autonomic (poly)neuropathy: Secondary | ICD-10-CM | POA: Diagnosis present

## 2017-05-14 DIAGNOSIS — G43A1 Cyclical vomiting, intractable: Secondary | ICD-10-CM | POA: Diagnosis not present

## 2017-05-14 DIAGNOSIS — I447 Left bundle-branch block, unspecified: Secondary | ICD-10-CM | POA: Diagnosis present

## 2017-05-14 DIAGNOSIS — Z952 Presence of prosthetic heart valve: Secondary | ICD-10-CM | POA: Diagnosis not present

## 2017-05-14 DIAGNOSIS — R109 Unspecified abdominal pain: Secondary | ICD-10-CM | POA: Diagnosis not present

## 2017-05-14 DIAGNOSIS — Z7984 Long term (current) use of oral hypoglycemic drugs: Secondary | ICD-10-CM

## 2017-05-14 DIAGNOSIS — R1013 Epigastric pain: Secondary | ICD-10-CM | POA: Diagnosis present

## 2017-05-14 DIAGNOSIS — R112 Nausea with vomiting, unspecified: Secondary | ICD-10-CM | POA: Diagnosis present

## 2017-05-14 DIAGNOSIS — T887XXA Unspecified adverse effect of drug or medicament, initial encounter: Secondary | ICD-10-CM

## 2017-05-14 DIAGNOSIS — Z89511 Acquired absence of right leg below knee: Secondary | ICD-10-CM

## 2017-05-14 DIAGNOSIS — F329 Major depressive disorder, single episode, unspecified: Secondary | ICD-10-CM | POA: Diagnosis present

## 2017-05-14 DIAGNOSIS — I25110 Atherosclerotic heart disease of native coronary artery with unstable angina pectoris: Secondary | ICD-10-CM | POA: Diagnosis not present

## 2017-05-14 DIAGNOSIS — E785 Hyperlipidemia, unspecified: Secondary | ICD-10-CM | POA: Diagnosis present

## 2017-05-14 DIAGNOSIS — Z79899 Other long term (current) drug therapy: Secondary | ICD-10-CM

## 2017-05-14 DIAGNOSIS — I6523 Occlusion and stenosis of bilateral carotid arteries: Secondary | ICD-10-CM | POA: Diagnosis not present

## 2017-05-14 DIAGNOSIS — R1111 Vomiting without nausea: Secondary | ICD-10-CM | POA: Diagnosis not present

## 2017-05-14 DIAGNOSIS — T383X5A Adverse effect of insulin and oral hypoglycemic [antidiabetic] drugs, initial encounter: Secondary | ICD-10-CM | POA: Diagnosis present

## 2017-05-14 DIAGNOSIS — R1110 Vomiting, unspecified: Secondary | ICD-10-CM | POA: Insufficient documentation

## 2017-05-14 DIAGNOSIS — Z881 Allergy status to other antibiotic agents status: Secondary | ICD-10-CM

## 2017-05-14 LAB — CBC WITH DIFFERENTIAL/PLATELET
BASOS ABS: 0 10*3/uL (ref 0.0–0.1)
BASOS PCT: 0 %
EOS PCT: 0 %
Eosinophils Absolute: 0 10*3/uL (ref 0.0–0.7)
HEMATOCRIT: 49.7 % (ref 39.0–52.0)
Hemoglobin: 16.6 g/dL (ref 13.0–17.0)
LYMPHS PCT: 11 %
Lymphs Abs: 1.9 10*3/uL (ref 0.7–4.0)
MCH: 28 pg (ref 26.0–34.0)
MCHC: 33.4 g/dL (ref 30.0–36.0)
MCV: 83.8 fL (ref 78.0–100.0)
MONO ABS: 1 10*3/uL (ref 0.1–1.0)
MONOS PCT: 6 %
NEUTROS ABS: 14.6 10*3/uL — AB (ref 1.7–7.7)
Neutrophils Relative %: 83 %
PLATELETS: 271 10*3/uL (ref 150–400)
RBC: 5.93 MIL/uL — ABNORMAL HIGH (ref 4.22–5.81)
RDW: 12.9 % (ref 11.5–15.5)
WBC: 17.5 10*3/uL — ABNORMAL HIGH (ref 4.0–10.5)

## 2017-05-14 LAB — URINALYSIS, ROUTINE W REFLEX MICROSCOPIC
BILIRUBIN URINE: NEGATIVE
Bacteria, UA: NONE SEEN
KETONES UR: 20 mg/dL — AB
LEUKOCYTES UA: NEGATIVE
NITRITE: NEGATIVE
PH: 5 (ref 5.0–8.0)
PROTEIN: 100 mg/dL — AB
Specific Gravity, Urine: 1.027 (ref 1.005–1.030)
Squamous Epithelial / LPF: NONE SEEN

## 2017-05-14 LAB — COMPREHENSIVE METABOLIC PANEL
ALT: 19 U/L (ref 17–63)
AST: 27 U/L (ref 15–41)
Albumin: 4.3 g/dL (ref 3.5–5.0)
Alkaline Phosphatase: 70 U/L (ref 38–126)
Anion gap: 19 — ABNORMAL HIGH (ref 5–15)
BILIRUBIN TOTAL: 1.5 mg/dL — AB (ref 0.3–1.2)
BUN: 23 mg/dL — ABNORMAL HIGH (ref 6–20)
CHLORIDE: 100 mmol/L — AB (ref 101–111)
CO2: 20 mmol/L — ABNORMAL LOW (ref 22–32)
Calcium: 10.4 mg/dL — ABNORMAL HIGH (ref 8.9–10.3)
Creatinine, Ser: 1.39 mg/dL — ABNORMAL HIGH (ref 0.61–1.24)
GFR calc Af Amer: 58 mL/min — ABNORMAL LOW (ref >60)
GFR, EST NON AFRICAN AMERICAN: 50 mL/min — AB (ref >60)
Glucose, Bld: 218 mg/dL — ABNORMAL HIGH (ref 65–99)
POTASSIUM: 4.8 mmol/L (ref 3.5–5.1)
Sodium: 139 mmol/L (ref 135–145)
TOTAL PROTEIN: 8.4 g/dL — AB (ref 6.5–8.1)

## 2017-05-14 LAB — BASIC METABOLIC PANEL
ANION GAP: 15 (ref 5–15)
BUN: 35 mg/dL — ABNORMAL HIGH (ref 6–20)
CO2: 19 mmol/L — AB (ref 22–32)
Calcium: 9.6 mg/dL (ref 8.9–10.3)
Chloride: 105 mmol/L (ref 101–111)
Creatinine, Ser: 1.58 mg/dL — ABNORMAL HIGH (ref 0.61–1.24)
GFR calc Af Amer: 50 mL/min — ABNORMAL LOW (ref >60)
GFR calc non Af Amer: 43 mL/min — ABNORMAL LOW (ref >60)
GLUCOSE: 287 mg/dL — AB (ref 65–99)
POTASSIUM: 4.8 mmol/L (ref 3.5–5.1)
Sodium: 139 mmol/L (ref 135–145)

## 2017-05-14 LAB — GLUCOSE, CAPILLARY
Glucose-Capillary: 211 mg/dL — ABNORMAL HIGH (ref 65–99)
Glucose-Capillary: 250 mg/dL — ABNORMAL HIGH (ref 65–99)
Glucose-Capillary: 211 mg/dL — ABNORMAL HIGH (ref 65–99)

## 2017-05-14 LAB — LACTIC ACID, PLASMA: Lactic Acid, Venous: 1.9 mmol/L (ref 0.5–1.9)

## 2017-05-14 LAB — I-STAT CG4 LACTIC ACID, ED: Lactic Acid, Venous: 2.57 mmol/L (ref 0.5–1.9)

## 2017-05-14 LAB — CBC
HEMATOCRIT: 48.3 % (ref 39.0–52.0)
HEMOGLOBIN: 16.5 g/dL (ref 13.0–17.0)
MCH: 28.7 pg (ref 26.0–34.0)
MCHC: 34.2 g/dL (ref 30.0–36.0)
MCV: 84.1 fL (ref 78.0–100.0)
Platelets: 238 10*3/uL (ref 150–400)
RBC: 5.74 MIL/uL (ref 4.22–5.81)
RDW: 13.4 % (ref 11.5–15.5)
WBC: 19.5 10*3/uL — ABNORMAL HIGH (ref 4.0–10.5)

## 2017-05-14 LAB — OCCULT BLOOD GASTRIC / DUODENUM (SPECIMEN CUP): OCCULT BLOOD, GASTRIC: POSITIVE — AB

## 2017-05-14 LAB — LIPASE, BLOOD: LIPASE: 23 U/L (ref 11–51)

## 2017-05-14 LAB — CBG MONITORING, ED: GLUCOSE-CAPILLARY: 199 mg/dL — AB (ref 65–99)

## 2017-05-14 MED ORDER — CARVEDILOL 6.25 MG PO TABS
6.2500 mg | ORAL_TABLET | Freq: Two times a day (BID) | ORAL | Status: DC
  Administered 2017-05-14: 6.25 mg via ORAL
  Filled 2017-05-14 (×3): qty 1

## 2017-05-14 MED ORDER — CARVEDILOL 12.5 MG PO TABS
6.2500 mg | ORAL_TABLET | Freq: Two times a day (BID) | ORAL | Status: DC
  Filled 2017-05-14: qty 1

## 2017-05-14 MED ORDER — INSULIN ASPART 100 UNIT/ML ~~LOC~~ SOLN
0.0000 [IU] | Freq: Every day | SUBCUTANEOUS | Status: DC
  Administered 2017-05-14: 2 [IU] via SUBCUTANEOUS

## 2017-05-14 MED ORDER — ONDANSETRON HCL 4 MG PO TABS: 4.0000 mg | ORAL_TABLET | Freq: Four times a day (QID) | ORAL | Status: DC | PRN

## 2017-05-14 MED ORDER — FUROSEMIDE 40 MG PO TABS
40.0000 mg | ORAL_TABLET | Freq: Every day | ORAL | Status: DC
  Administered 2017-05-16 – 2017-05-18 (×2): 40 mg via ORAL
  Filled 2017-05-14 (×5): qty 1

## 2017-05-14 MED ORDER — SUCRALFATE 1 GM/10ML PO SUSP
1.0000 g | Freq: Two times a day (BID) | ORAL | Status: DC
  Administered 2017-05-14 – 2017-05-18 (×7): 1 g via ORAL
  Filled 2017-05-14 (×8): qty 10

## 2017-05-14 MED ORDER — METOPROLOL TARTRATE 5 MG/5ML IV SOLN
2.5000 mg | Freq: Four times a day (QID) | INTRAVENOUS | Status: DC | PRN
  Administered 2017-05-14 – 2017-05-15 (×2): 2.5 mg via INTRAVENOUS
  Filled 2017-05-14 (×3): qty 5

## 2017-05-14 MED ORDER — ACETAMINOPHEN 650 MG RE SUPP: 650.0000 mg | Freq: Four times a day (QID) | RECTAL | Status: DC | PRN

## 2017-05-14 MED ORDER — HYDRALAZINE HCL 20 MG/ML IJ SOLN: 5.0000 mg | INTRAMUSCULAR | Status: DC | PRN

## 2017-05-14 MED ORDER — SODIUM CHLORIDE 0.9 % IV BOLUS (SEPSIS)
1000.0000 mL | Freq: Once | INTRAVENOUS | Status: AC
  Administered 2017-05-14: 1000 mL via INTRAVENOUS

## 2017-05-14 MED ORDER — INSULIN ASPART 100 UNIT/ML ~~LOC~~ SOLN
0.0000 [IU] | Freq: Three times a day (TID) | SUBCUTANEOUS | Status: DC
  Administered 2017-05-14 – 2017-05-15 (×3): 3 [IU] via SUBCUTANEOUS
  Administered 2017-05-15 – 2017-05-16 (×5): 2 [IU] via SUBCUTANEOUS
  Administered 2017-05-17: 1 [IU] via SUBCUTANEOUS
  Administered 2017-05-18: 3 [IU] via SUBCUTANEOUS

## 2017-05-14 MED ORDER — ATORVASTATIN CALCIUM 10 MG PO TABS
10.0000 mg | ORAL_TABLET | Freq: Every morning | ORAL | Status: DC
  Filled 2017-05-14 (×3): qty 1

## 2017-05-14 MED ORDER — ACETAMINOPHEN 325 MG PO TABS
650.0000 mg | ORAL_TABLET | Freq: Once | ORAL | Status: DC
  Filled 2017-05-14: qty 2

## 2017-05-14 MED ORDER — APIXABAN 5 MG PO TABS: 5.0000 mg | ORAL_TABLET | Freq: Two times a day (BID) | ORAL | Status: DC

## 2017-05-14 MED ORDER — AMIODARONE HCL 100 MG PO TABS
100.0000 mg | ORAL_TABLET | Freq: Every day | ORAL | Status: DC
  Administered 2017-05-16 – 2017-05-18 (×2): 100 mg via ORAL
  Filled 2017-05-14 (×5): qty 1

## 2017-05-14 MED ORDER — ENOXAPARIN SODIUM 120 MG/0.8ML ~~LOC~~ SOLN
120.0000 mg | Freq: Two times a day (BID) | SUBCUTANEOUS | Status: DC
  Administered 2017-05-14 – 2017-05-15 (×4): 120 mg via SUBCUTANEOUS
  Filled 2017-05-14 (×5): qty 0.8

## 2017-05-14 MED ORDER — GI COCKTAIL ~~LOC~~
30.0000 mL | Freq: Once | ORAL | Status: DC
Start: 2017-05-14 — End: 2017-05-14
  Filled 2017-05-14: qty 30

## 2017-05-14 MED ORDER — FUROSEMIDE 20 MG PO TABS
20.0000 mg | ORAL_TABLET | Freq: Once | ORAL | Status: DC
  Filled 2017-05-14: qty 1

## 2017-05-14 MED ORDER — DEXTROSE-NACL 5-0.45 % IV SOLN
INTRAVENOUS | Status: DC
  Administered 2017-05-14 – 2017-05-15 (×2): via INTRAVENOUS

## 2017-05-14 MED ORDER — ONDANSETRON HCL 4 MG/2ML IJ SOLN
4.0000 mg | Freq: Four times a day (QID) | INTRAMUSCULAR | Status: DC | PRN
  Administered 2017-05-14 – 2017-05-15 (×3): 4 mg via INTRAVENOUS
  Filled 2017-05-14 (×3): qty 2

## 2017-05-14 MED ORDER — ONDANSETRON HCL 4 MG PO TABS: 4.0000 mg | ORAL_TABLET | Freq: Once | ORAL | Status: DC

## 2017-05-14 MED ORDER — SODIUM CHLORIDE 0.9 % IV SOLN
1000.0000 mL | INTRAVENOUS | Status: DC
  Administered 2017-05-14: 1000 mL via INTRAVENOUS

## 2017-05-14 MED ORDER — PROMETHAZINE HCL 25 MG/ML IJ SOLN
12.5000 mg | Freq: Four times a day (QID) | INTRAMUSCULAR | Status: DC | PRN
  Administered 2017-05-14 – 2017-05-15 (×2): 12.5 mg via INTRAVENOUS
  Filled 2017-05-14 (×2): qty 1

## 2017-05-14 MED ORDER — FAMOTIDINE IN NACL 20-0.9 MG/50ML-% IV SOLN
20.0000 mg | Freq: Two times a day (BID) | INTRAVENOUS | Status: DC
  Administered 2017-05-14 – 2017-05-16 (×5): 20 mg via INTRAVENOUS
  Filled 2017-05-14 (×7): qty 50

## 2017-05-14 MED ORDER — ACETAMINOPHEN 325 MG PO TABS
650.0000 mg | ORAL_TABLET | Freq: Four times a day (QID) | ORAL | Status: DC | PRN
  Administered 2017-05-16: 650 mg via ORAL
  Filled 2017-05-14: qty 2

## 2017-05-14 NOTE — Care Management Obs Status (Signed)
[place] NOTIFICATION   Patient Details  Name: TIRSO LAWS MRN: [id number] Date of Birth: 1946-08-21   Medicare Observation Status Notification Given:  Yes    Carles Collet, RN 05/14/2017, 11:13 AM

## 2017-05-14 NOTE — Progress Notes (Signed)
ANTICOAGULATION CONSULT NOTE - Initial Consult  Pharmacy Consult for lovenox Indication: atrial fibrillation  Allergies  Allergen Reactions  . Amoxicill-Clarithro-Omeprazole Diarrhea and Nausea And Vomiting    Patient Measurements: Height: 6' (182.9 cm) Weight: 265 lb (120.2 kg) IBW/kg (Calculated) : 77.6  Vital Signs: Temp: 98.2 F (36.8 C) (08/28 0344) Temp Source: Oral (08/28 0344) BP: 145/75 (08/28 0730) Pulse Rate: 95 (08/28 0730)  Labs:  Recent Labs  05/14/17 0405  HGB 16.6  HCT 49.7  PLT 271  CREATININE 1.39*    Estimated Creatinine Clearance: 67.1 mL/min (A) (by C-G formula based on SCr of 1.39 mg/dL (H)).  Assessment: 48 yom presented to the ED with N/V. He is on chronic apixaban for history of afib. However, he has been unable to take due to vomiting. To start lovenox for now until pt is able to take PO. Baseline H/H and platelets are WNL. No bleeding noted. Scr mildly elevated at 1.39.   Goal of Therapy:  Anti-Xa level 0.6-1 units/ml 4hrs after LMWH dose given Monitor platelets by anticoagulation protocol: Yes   Plan:  Lovenox 120mg  SQ Q12H F/u CBC, S&S of bleeding F/u ability to restart of apixaban when appropriate  Rihana Kiddy, Rande Lawman 05/14/2017,8:30 AM

## 2017-05-14 NOTE — Progress Notes (Signed)
Patient unable to hold down anything by mouth, medications held: coreg, pacerone, lipitor. Provider notified.   1:00 patient emesis is brown in color and he is concerned, made provider aware.

## 2017-05-14 NOTE — Plan of Care (Signed)
Received signout regarding patient from Abigail Butts, PA-C [name] is a 71 year old male pmh of HTN, HLD, DM type II, PAF, s/p AVR, sys CHF; who presents with N/V x 3 days after switching to Trulicity. VS: Afebrile, pulse 92-101, respirations 8-20, blood pressure 152/86 -185/95, and O2 sats maintained. Labs: WBC 17.5, sodium 139, chloride 100, CO2 20, BUN 23, creatinine 1.39, calcium 10.4, glucose 218, lactic acid 2.57, and anion gap 19. Urinalysis had not been obtained yet. Asked for in and out cath to be performed. Patient was given 1 L normal saline IV fluids and start her rate of 125 mL per hour. Symptoms were thought to be less likely infectious in nature.

## 2017-05-14 NOTE — H&P (Signed)
History and Physical    Thomas Wright GHW:[id number] DOB: 24-Feb-1946 DOA: 05/14/2017  PCP: Leighton Ruff, MD Patient coming from: home  Chief Complaint: nausea and emesis  HPI: Thomas Wright is a 71 y.o. male with medical history significant of diabetes, systolic and diastolic congestive heart failure, depression, hyperlipidemia, hypertension, PAF, status post aVR. Presenting with 3 day history of nausea vomiting and abdominal discomfort. Initially started out very mild but becoming more severe and constant. Symptoms started a few hours after starting her first injection of Trulicity. Patient also reports some loose stools. Patient states he also has stopped his insulin as he was told not to take along the true laxity. Her little oral intake over this period of time due to the fact that every time he eats or drinks, doesn't become more nauseous and had nonbloody nonbilious emesis. Denies chest pain, palpitations, fevers, symptoms, headache, dysuria, frequency, focal neurological deficit, rash.  ED Course: Given 1L NS bolus in ED. Objective findings outlined below  Review of Systems: As per HPI otherwise all other systems reviewed and are negative  Ambulatory Status:no restrictions  Past Medical History:  Diagnosis Date  . Arthritis    .  Marland Kitchen Cardiomyopathy ([place])    EF originally 20%-now 45%, no CAD on cath  . Chronic combined systolic and diastolic CHF (congestive heart failure) ([place])    a. 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 Reported h/o cath in 2008 without significant CAD.  Marland Kitchen Depression    "a little right now" (04/19/2015)  . Hyperlipidemia   . Hypertension   . Hypertriglyceridemia   . PAF (paroxysmal atrial fibrillation) ([place])    a. Postop 2008. b. recurrent afib 08/2014 with LAA clot noted on TEE. EF was down again at 25%. He was anticoagulated with Eliquis and placed on Amiodarone.He ultimately underwent  TEE-DCCV 08/2014.  . S/P aortic valve replacement with bioprosthetic valve    a. bioprosthetic AVR in 05/2007 in [place].  . Type II diabetes mellitus ([place])     Past Surgical History:  Procedure Laterality Date  . AMPUTATION Right 07/29/2014   Procedure: AMPUTATION RIGHT LONG FINGER;  Surgeon: [name], MD;  Location: [place];  Service: Orthopedics;  Laterality: Right;  . AMPUTATION Right 01/13/2016   Procedure: RIGHT BELOW KNEE AMPUTATION;  Surgeon: Newt Minion, MD;  Location: [place];  Service: Orthopedics;  Laterality: Right;  . AORTIC VALVE REPLACEMENT  05/2007   with tissue graft  . APPLICATION OF WOUND VAC Right 01/13/2016   Procedure: APPLICATION OF WOUND VAC;  Surgeon: Newt Minion, MD;  Location: [facility];  Service: Orthopedics;  Laterality: Right;  . CARDIAC CATHETERIZATION  "several"  . CARDIAC VALVE REPLACEMENT    . CARDIOVERSION N/A 07/23/2014   Procedure: CARDIOVERSION;  Surgeon: Josue Hector, MD;  Location: [facility];  Service: Cardiovascular;  Laterality: N/A;  . CARDIOVERSION N/A 09/01/2014   Procedure: CARDIOVERSION;  Surgeon: [name], MD;  Location[facility] ENDOSCOPY;  Service: Cardiovascular;  Laterality: N/A;  . I&D EXTREMITY Right 05/05/2014   Procedure: IRRIGATION AND DEBRIDEMENT EXTREMITY;  Surgeon: [name], MD;  Location: [place];  Service: Orthopedics;  Laterality: Right;  . TEE WITHOUT CARDIOVERSION N/A 07/23/2014   Procedure: TRANSESOPHAGEAL ECHOCARDIOGRAM (TEE);  Surgeon: Josue Hector, MD;  Location: [place];  Service: Cardiovascular;  Laterality: N/A;  . TEE WITHOUT CARDIOVERSION N/A 09/01/2014   Procedure: TRANSESOPHAGEAL ECHOCARDIOGRAM (TEE);  Surgeon: [name], MD;  Location: [facility] ENDOSCOPY;  Service: Cardiovascular;  Laterality: N/A;  . TEE WITHOUT CARDIOVERSION N/A 04/17/2017   Procedure: TRANSESOPHAGEAL ECHOCARDIOGRAM (TEE);  Surgeon: Jerline Pain, MD;  Location: [facility] ENDOSCOPY;  Service: Cardiovascular;  Laterality: N/A;  .  TONSILLECTOMY  1954    Social History   Social History  . Marital status: Divorced    Spouse name: N/A  . Number of children: N/A  . Years of education: N/A   Occupational History  . retired     Social History Main Topics  . Smoking status: Never Smoker  . Smokeless tobacco: Never Used  . Alcohol use 0.0 oz/week     Comment: 04/19/2015 "might have a beer or 2, 2-3 times/yr"  . Drug use: No  . Sexual activity: No   Other Topics Concern  . Not on file   Social History Narrative  . No narrative on file    Allergies  Allergen Reactions  . Amoxicill-Clarithro-Omeprazole Diarrhea and Nausea And Vomiting    Family History  Problem Relation Age of Onset  . Colon cancer Father   . Diabetes Father   . Liver disease Brother   . Heart murmur Child   . Congenital heart disease Other   . Diabetes Maternal Grandmother   . Heart disease Maternal Grandfather        A Fib      Prior to Admission medications   Medication Sig Start Date End Date Taking? Authorizing Provider  amiodarone (PACERONE) 100 MG tablet Take 1 tablet (100 mg total) by mouth daily. 03/14/17  Yes Jerline Pain, MD  apixaban (ELIQUIS) 5 MG TABS tablet Take 1 tablet (5 mg total) by mouth 2 (two) times daily. 01/21/17  Yes Jerline Pain, MD  atorvastatin (LIPITOR) 10 MG tablet Take 1 tablet (10 mg total) by mouth every morning. 01/21/17  Yes Jerline Pain, MD  carvedilol (COREG) 6.25 MG tablet Take 6.25 mg by mouth 2 (two) times daily with a meal.   Yes [provider]  DHA-EPA-Coenzyme Q10-Vitamin E (CO Q-10 VITAMIN E FISH OIL PO) Take 1,000 mg by mouth 2 (two) times daily.   Yes [provider]  furosemide (LASIX) 40 MG tablet Take 1 tablet (40 mg total) by mouth daily. 02/03/16  Yes Angiulli, Lavon Paganini, PA-C  JARDIANCE 10 MG TABS tablet Take 10 mg by mouth daily. 04/08/17  Yes [provider]  Multiple Vitamin (MULTIVITAMIN) tablet Take 1 tablet by mouth daily.   Yes [provider]  TRULICITY 1.5 ZO/1.0RU SOPN Inject 1.5 mg into the skin once a week. 05/06/17  Yes [provider]  ACCU-CHEK SMARTVIEW test strip USE AS INSTRUCTED TO CHECK BLOOD SUGAR FOUR TIMES DAILY 12/24/16   Elayne Snare, MD  Blood Glucose Monitoring Suppl (ACCU-CHEK NANO SMARTVIEW) w/Device KIT USE TO CHECK BLOOD SUGAR FOUR TIMES PER DAY (NEEDS APPOINTMENT) 03/13/16   Elayne Snare, MD  Insulin Pen Needle 31G X 5 MM MISC 1 each by Does not apply route 4 (four) times daily -  before meals and at bedtime. 01/17/16   Lavina Hamman, MD  NOVOLIN R 100 UNIT/ML injection INJECT  6 UNITS BEFORE BREAKFAST  AND  LUNCH  AND  8 UNITS BEFORE SUPPER (DISCARD AND BEGIN A NEW VIAL AFTER 42 DAYS) Patient not taking: Reported on 05/14/2017 10/17/16   Elayne Snare, MD    Physical Exam: Vitals:   05/14/17 0430 05/14/17 0530 05/14/17 0700 05/14/17 0730  BP: Marland Kitchen)  169/77 (!) 185/95 (!) 170/84 (!) 145/75  Pulse: (!) 101 100 100 95  Resp: 20 (!) 8 20 16   Temp:      TempSrc:      SpO2: 92% 95% 96% 96%  Weight:      Height:         General: Appears ill, resting in bed, Appears calm and comfortable Eyes:  PERRL, EOMI, normal lids, iris ENT: Very dry mucous membranes, normal hearing Neck:  no LAD, masses or thyromegaly Cardiovascular:  RRR, no m/r/g..  Respiratory:  CTA bilaterally, no w/r/r. Normal respiratory effort. Abdomen: Mild diffuse tenderness to palpation, normoactive bowel sounds, obese, nondistended Skin:  no rash or induration seen on limited exam Musculoskeletal:  grossly normal tone BUE, good ROM, no bony abnormality Psychiatric:  grossly normal mood and affect, speech fluent and appropriate, AOx3 Neurologic:  CN 2-12 grossly intact, moves all extremities in coordinated fashion, sensation intact  Labs on Admission: I have personally reviewed following labs and imaging studies  CBC:  Recent Labs Lab 05/14/17 0405  WBC 17.5*  NEUTROABS 14.6*  HGB 16.6  HCT 49.7  MCV 83.8  PLT 259   Basic  Metabolic Panel:  Recent Labs Lab 05/14/17 0405  NA 139  K 4.8  CL 100*  CO2 20*  GLUCOSE 218*  BUN 23*  CREATININE 1.39*  CALCIUM 10.4*   GFR: Estimated Creatinine Clearance: 67.1 mL/min (A) (by C-G formula based on SCr of 1.39 mg/dL (H)). Liver Function Tests:  Recent Labs Lab 05/14/17 0405  AST 27  ALT 19  ALKPHOS 70  BILITOT 1.5*  PROT 8.4*  ALBUMIN 4.3    Recent Labs Lab 05/14/17 0405  LIPASE 23   No results for input(s): AMMONIA in the last 168 hours. Coagulation Profile: No results for input(s): INR, PROTIME in the last 168 hours. Cardiac Enzymes: No results for input(s): CKTOTAL, CKMB, CKMBINDEX, TROPONINI in the last 168 hours. BNP (last 3 results) No results for input(s): PROBNP in the last 8760 hours. HbA1C: No results for input(s): HGBA1C in the last 72 hours. CBG:  Recent Labs Lab 05/14/17 0353  GLUCAP 199*   Lipid Profile: No results for input(s): CHOL, HDL, LDLCALC, TRIG, CHOLHDL, LDLDIRECT in the last 72 hours. Thyroid Function Tests: No results for input(s): TSH, T4TOTAL, FREET4, T3FREE, THYROIDAB in the last 72 hours. Anemia Panel: No results for input(s): VITAMINB12, FOLATE, FERRITIN, TIBC, IRON, RETICCTPCT in the last 72 hours. Urine analysis:    Component Value Date/Time   COLORURINE YELLOW 05/14/2017 [place] 05/14/2017 0737   LABSPEC 1.027 05/14/2017 0737   PHURINE 5.0 05/14/2017 0737   GLUCOSEU >=500 (A) 05/14/2017 0737   HGBUR MODERATE (A) 05/14/2017 0737   BILIRUBINUR NEGATIVE 05/14/2017 0737   KETONESUR 20 (A) 05/14/2017 0737   PROTEINUR 100 (A) 05/14/2017 0737   UROBILINOGEN 0.2 07/28/2014 1739   NITRITE NEGATIVE 05/14/2017 0737   LEUKOCYTESUR NEGATIVE 05/14/2017 0737    Creatinine Clearance: Estimated Creatinine Clearance: 67.1 mL/min (A) (by C-G formula based on SCr of 1.39 mg/dL (H)).  Sepsis Labs: @LABRCNTIP (procalcitonin:4,lacticidven:4) )No results found for this or any previous visit  (from the past 240 hour(s)).   Radiological Exams on Admission: No results found.  EKG: Independently reviewed. LBBB, no ACS, sinus  Assessment/Plan Active Problems:   PAF (paroxysmal atrial fibrillation) (HCC)   Chronic combined systolic and diastolic congestive heart failure (H[facility])   Leukocytosis   Intractable nausea and vomiting   Medication reaction   Metabolic acidosis  CKD (chronic kidney disease), stage III   Intractable n/v: Likely due to medication reaction as sx started shortly after initial dose of Trulicity (1/2 life 5 days). May be from viral gastroenteritis or other process but less likely. Received 1L NS bolus in ED - KUB - IVF - Phenergan - Bowel rest - clears when able - D5 8/3OD  Metabolic Acidosis: likely secondary poor oral intake and diabetes w/ anion gap 19, glucose 218, bicarbonate 20, lactic acid 2.57. Doubt infectious processes patient is afebrile, UA normal.  - D5 1/2NS - Insulin - KUB, CXR - BMP at 17:00 and in am - Trend Lactic acid  Chronic combined systolic and diastolic congestive heart failure: Last echo showing an EF of 20% and grade 3 diastolic dysfunction. Currently no evidence of fluid overload. - I's and O's, daily weights - Continue Lasix in am  HTN: - contineu Coreg,  - Hydralazine IV PRN  PAF: - continue amiodarone, Coreg - Resume Eliquis when taking PO (currently on full dose lovenox)  HLD: - copntinue statin  CKD: Cr 1.3 which is slightly above baseline.  - IVF as above - BMP     DVT prophylaxis: Lovenox  Code Status: full  Family Communication: wife  Disposition Plan: pending improvement  Consults called: noen  Admission status: observation    MERRELL, DAVID J MD [facility]  If 7PM-7AM, please contact night-coverage [email or web address] Password [facility]  05/14/2017, 8:26 AM

## 2017-05-14 NOTE — ED Triage Notes (Signed)
Patient here for evaluation of N/V after stating on trulicity 2 days ago.  Hx of diabetes controlled with medications. No other complaints, A&Ox4

## 2017-05-14 NOTE — ED Provider Notes (Signed)
[facility] DEPT Provider Note   CSN: [id number] Arrival date & time: 05/14/17  0335     History   Chief Complaint Chief Complaint  Patient presents with  . Nausea  . Emesis    HPI Thomas Wright is a 71 y.o. male with a hx of Arthritis, Cod[place] but the with EF of 81%, chronic systolic and diastolic heart failure, depression, hypertension, atrial fibrillation anticoagulated on a liquid, aortic valve replacement with bioprosthetic valve, insulin dependent diabetes presents to the Emergency Department complaining of gradual, persistent, progressively worsening generalized abdominal pain with associated nausea, vomiting and diarrhea onset Saturday several hours after taking a new medication. Patient reports that Saturday morning he took his first injection of Trulicity.  He reports that he was to stop taking his insulin in lieu of the new medication. He reports that his emesis has been too numerous to count but has been nonbloody and nonbilious. He reports nothing seems to make the symptoms better or worse. He has tried clear liquids without relief. Patient denies diarrhea. He denies international travel or sick contacts. Patient also denies fevers or chills, chest pain, shortness of breath, weakness, dizziness, syncope.    The history is provided by the patient, medical records and the spouse. No language interpreter was used.    Past Medical History:  Diagnosis Date  . Arthritis    .  Marland Kitchen Cardiomyopathy ([place])    EF originally 20%-now 45%, no CAD on cath  . Chronic combined systolic and diastolic CHF (congestive heart failure) ([place])    a. 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 Reported h/o cath in 2008 without significant CAD.  Marland Kitchen Depression    "a little right now" (04/19/2015)  . Hyperlipidemia   . Hypertension   . Hypertriglyceridemia   . PAF (paroxysmal atrial fibrillation) ([place])    a. Postop 2008. b.  recurrent afib 08/2014 with LAA clot noted on TEE. EF was down again at 25%. He was anticoagulated with Eliquis and placed on Amiodarone.He ultimately underwent TEE-DCCV 08/2014.  . S/P aortic valve replacement with bioprosthetic valve    a. bioprosthetic AVR in 05/2007 in [place].  . Type II diabetes mellitus [facility])     Patient Active Problem List   Diagnosis Date Noted  . Bilateral carotid artery disease ([place]) 04/01/2017  . Diabetic polyneuropathy associated with type 2 diabetes mellitus ([place]) 02/14/2017  . Impingement syndrome of right shoulder 02/14/2017  . Fluid retention   . Labile blood glucose   . Toe erythema   . Hypoglycemia associated with type 2 diabetes mellitus ([place])   . Hyponatremia   . Diarrhea 01/17/2016  . Amputation of right lower extremity below knee with complication ([place]) 82/99/3716  . AKI (acute kidney injury) ([place])   . Type 2 diabetes mellitus with peripheral neuropathy (HCC)   . Status post aortic valve replacement   . Chronic combined systolic and diastolic congestive heart failure ([place])   . ATN (acute tubular necrosis) ([place])   . Postoperative pain   . Acute blood loss anemia   . Leukocytosis   . Morbid obesity ([place])   . Status post below knee amputation of right lower extremity ([place])   . Insulin dependent diabetes mellitus ([place]) 01/09/2016  . Diabetic foot infection ([place]) 01/09/2016  . Sepsis, unspecified organism ([place]) 01/09/2016  . PAF (paroxysmal atrial fibrillation) ([place]) 04/22/2015  . Essential hypertension 04/22/2015  . Obesity 04/22/2015  .  Dyspnea 04/20/2015  . Acute on chronic diastolic CHF (congestive heart failure) ([place]) 04/19/2015  . Cellulitis of leg 11/23/2014  . Diabetes ([place]) 09/13/2014  . Chronic anticoagulation 09/13/2014  . Atrial flutter, unspecified   . Paroxysmal atrial fibrillation (HCC)   . Perirectal abscess   . Other hemorrhoids   . Tenosynovitis of finger 05/05/2014  . Cellulitis and abscess of digit 05/05/2014  . Back  pain   . H/O aortic valve replacement with tissue graft   . Hypertriglyceridemia   . Hypertension     Past Surgical History:  Procedure Laterality Date  . AMPUTATION Right 07/29/2014   Procedure: AMPUTATION RIGHT LONG FINGER;  Surgeon: [name], MD;  Location: [facility];  Service: Orthopedics;  Laterality: Right;  . AMPUTATION Right 01/13/2016   Procedure: RIGHT BELOW KNEE AMPUTATION;  Surgeon: Newt Minion, MD;  Location: [facility];  Service: Orthopedics;  Laterality: Right;  . AORTIC VALVE REPLACEMENT  05/2007   with tissue graft  . APPLICATION OF WOUND VAC Right 01/13/2016   Procedure: APPLICATION OF WOUND VAC;  Surgeon: Newt Minion, MD;  Location: [facility];  Service: Orthopedics;  Laterality: Right;  . CARDIAC CATHETERIZATION  "several"  . CARDIAC VALVE REPLACEMENT    . CARDIOVERSION N/A 07/23/2014   Procedure: CARDIOVERSION;  Surgeon: Josue Hector, MD;  Location: [facility];  Service: Cardiovascular;  Laterality: N/A;  . CARDIOVERSION N/A 09/01/2014   Procedure: CARDIOVERSION;  Surgeon: [name], MD;  [facility] ENDOSCOPY;  Service: Cardiovascular;  Laterality: N/A;  . I&D EXTREMITY Right 05/05/2014   Procedure: IRRIGATION AND DEBRIDEMENT EXTREMITY;  Surgeon: [name], MD;  Location: [facility];  Service: Orthopedics;  Laterality: Right;  . TEE WITHOUT CARDIOVERSION N/A 07/23/2014   Procedure: TRANSESOPHAGEAL ECHOCARDIOGRAM (TEE);  Surgeon: Josue Hector, MD;  Location: [facility];  Service: Cardiovascular;  Laterality: N/A;  . TEE WITHOUT CARDIOVERSION N/A 09/01/2014   Procedure: TRANSESOPHAGEAL ECHOCARDIOGRAM (TEE);  Surgeon: [name], MD;  Location: [facility] ENDOSCOPY;  Service: Cardiovascular;  Laterality: N/A;  . TEE WITHOUT CARDIOVERSION N/A 04/17/2017   Procedure: TRANSESOPHAGEAL ECHOCARDIOGRAM (TEE);  Surgeon: Jerline Pain, MD;  Location: [facility] ENDOSCOPY;  Service: Cardiovascular;  Laterality: N/A;  . [place] Medications    Prior to  Admission medications   Medication Sig Start Date End Date Taking? Authorizing Provider  amiodarone (PACERONE) 100 MG tablet Take 1 tablet (100 mg total) by mouth daily. 03/14/17  Yes Jerline Pain, MD  apixaban (ELIQUIS) 5 MG TABS tablet Take 1 tablet (5 mg total) by mouth 2 (two) times daily. 01/21/17  Yes Jerline Pain, MD  atorvastatin (LIPITOR) 10 MG tablet Take 1 tablet (10 mg total) by mouth every morning. 01/21/17  Yes Jerline Pain, MD  carvedilol (COREG) 6.25 MG tablet Take 6.25 mg by mouth 2 (two) times daily with a meal.   Yes [provider]  DHA-EPA-Coenzyme Q10-Vitamin E (CO Q-10 VITAMIN E FISH OIL PO) Take 1,000 mg by mouth 2 (two) times daily.   Yes [provider]  furosemide (LASIX) 40 MG tablet Take 1 tablet (40 mg total) by mouth daily. 02/03/16  Yes Angiulli, Lavon Paganini, PA-C  JARDIANCE 10 MG TABS tablet Take 10 mg by mouth daily. 04/08/17  Yes [provider]  Multiple Vitamin (MULTIVITAMIN) tablet Take 1 tablet by mouth daily.   Yes [provider]  TRULICITY 1.5 TI/1.4ER SOPN Inject 1.5 mg into the skin once a week. 05/06/17  Yes [provider]  ACCU-CHEK SMARTVIEW test strip USE AS INSTRUCTED TO CHECK BLOOD SUGAR FOUR TIMES DAILY 12/24/16   Elayne Snare, MD  Blood Glucose Monitoring Suppl (ACCU-CHEK NANO SMARTVIEW) w/Device KIT USE TO CHECK BLOOD SUGAR FOUR TIMES PER DAY (NEEDS APPOINTMENT) 03/13/16   Elayne Snare, MD  Insulin Pen Needle 31G X 5 MM MISC 1 each by Does not apply route 4 (four) times daily -  before meals and at bedtime. 01/17/16   Lavina Hamman, MD  NOVOLIN R 100 UNIT/ML injection INJECT  6 UNITS BEFORE BREAKFAST  AND  LUNCH  AND  8 UNITS BEFORE SUPPER (DISCARD AND BEGIN A NEW VIAL AFTER 42 DAYS) Patient not taking: Reported on 05/14/2017 10/17/16   Elayne Snare, MD    Family History Family History  Problem Relation Age of Onset  . Colon cancer Father   . Diabetes Father   . Liver disease Brother   . Heart murmur Child     . Congenital heart disease Other   . Diabetes Maternal Grandmother   . Heart disease Maternal Grandfather        A Fib    Social History Social History  Substance Use Topics  . Smoking status: Never Smoker  . Smokeless tobacco: Never Used  . Alcohol use 0.0 oz/week     Comment: 04/19/2015 "might have a beer or 2, 2-3 times/yr"     Allergies   Amoxicill-clarithro-omeprazole   Review of Systems Review of Systems  Constitutional: Positive for fatigue. Negative for appetite change, diaphoresis, fever and unexpected weight change.  HENT: Negative for mouth sores.   Eyes: Negative for visual disturbance.  Respiratory: Negative for cough, chest tightness, shortness of breath and wheezing.   Cardiovascular: Negative for chest pain.  Gastrointestinal: Positive for abdominal pain, nausea and vomiting. Negative for constipation and diarrhea.  Endocrine: Negative for polydipsia, polyphagia and polyuria.  Genitourinary: Negative for dysuria, frequency, hematuria and urgency.  Musculoskeletal: Negative for back pain and neck stiffness.  Skin: Negative for rash.  Allergic/Immunologic: Negative for immunocompromised state.  Neurological: Negative for syncope, light-headedness and headaches.  Hematological: Does not bruise/bleed easily.  Psychiatric/Behavioral: Negative for sleep disturbance. The patient is not nervous/anxious.   All other systems reviewed and are negative.    Physical Exam Updated Vital Signs BP (!) 152/86 (BP Location: Left Arm)   Pulse 92   Temp 98.2 F (36.8 C) (Oral)   Resp 18   Ht 6' (1.829 m)   Wt 120.2 kg (265 lb)   SpO2 96%   BMI 35.94 kg/m   Physical Exam  Constitutional: He appears well-developed and well-nourished. No distress.  Awake, alert, uncomfortable appearing  HENT:  Head: Normocephalic and atraumatic.  Mouth/Throat: Oropharynx is clear and moist. No oropharyngeal exudate.  Eyes: Conjunctivae are normal. No scleral icterus.  Neck: Normal  range of motion. Neck supple.  Cardiovascular: Normal rate, regular rhythm and intact distal pulses.   Pulses:      Radial pulses are 2+ on the right side, and 2+ on the left side.       Posterior tibial pulses are 2+ on the left side. Right posterior tibial pulse not accessible.  Pulmonary/Chest: Effort normal and breath sounds normal. No respiratory distress. He has no wheezes.  Equal chest expansion  Abdominal: Soft. Bowel sounds are normal. He exhibits no mass. There is generalized tenderness. There is no rigidity, no rebound, no guarding and negative Murphy's sign.  Mild, generalized tenderness without rigidity or guarding  Musculoskeletal: Normal range of motion. He exhibits no edema.  Right BKA  Neurological: He is alert.  Speech is clear and goal oriented Moves extremities without ataxia  Skin: Skin is warm and dry. He is not diaphoretic.  Numerous scratches over the arms and legs but no areas of cellulitis  Psychiatric: He has a normal mood and affect.  Nursing note and vitals reviewed.    ED Treatments / Results  Labs (all labs ordered are listed, but only abnormal results are displayed) Labs Reviewed  CBC WITH DIFFERENTIAL/PLATELET - Abnormal; Notable for the following:       Result Value   WBC 17.5 (*)    RBC 5.93 (*)    Neutro Abs 14.6 (*)    All other components within normal limits  COMPREHENSIVE METABOLIC PANEL - Abnormal; Notable for the following:    Chloride 100 (*)    CO2 20 (*)    Glucose, Bld 218 (*)    BUN 23 (*)    Creatinine, Ser 1.39 (*)    Calcium 10.4 (*)    Total Protein 8.4 (*)    Total Bilirubin 1.5 (*)    GFR calc non Af Amer 50 (*)    GFR calc Af Amer 58 (*)    Anion gap 19 (*)    All other components within normal limits  I-STAT CG4 LACTIC ACID, ED - Abnormal; Notable for the following:    Lactic Acid, Venous 2.57 (*)    All other components within normal limits  CBG MONITORING, ED - Abnormal; Notable for the following:     Glucose-Capillary 199 (*)    All other components within normal limits  LIPASE, BLOOD  URINALYSIS, ROUTINE W REFLEX MICROSCOPIC  I-STAT CG4 LACTIC ACID, ED    EKG  EKG Interpretation  Date/Time:  Tuesday May 14 2017 03:42:48 EDT Ventricular Rate:  91 PR Interval:    QRS Duration: 151 QT Interval:  411 QTC Calculation: 506 R Axis:   -27 Text Interpretation:  Sinus or ectopic atrial rhythm Left bundle branch block No significant change was found Confirmed by Ezequiel Essex [phone]) on 05/14/2017 3:50:45 AM       Radiology No results found.  Procedures Procedures (including critical care time)  Medications Ordered in ED Medications  sodium chloride 0.9 % bolus 1,000 mL (1,000 mLs Intravenous New Bag/Given 05/14/17 0409)    Followed by  0.9 %  sodium chloride infusion (not administered)  carvedilol (COREG) tablet 6.25 mg (not administered)  furosemide (LASIX) tablet 20 mg (not administered)     Initial Impression / Assessment and Plan / ED Course  I have reviewed the triage vital signs and the nursing notes.  Pertinent labs & imaging results that were available during my care of the patient were reviewed by me and considered in my medical decision making (see chart for details).  Clinical Course as of May 14 638  Tue May 14, 2017  0634 Discussed with Dr. Tamala Julian who will admit.    [HM]  [phone] Pt remains hypertensive, but has not had his home medications in several days BP: (!) 185/95 [HM]  [id number] Repeat evaluation: Abdomen remained soft without rebound or guarding. No focal tenderness.  [HM]    Clinical Course User Index [HM] Faigy Stretch, Jarrett [place], [place]    Patient presents with persistent nausea vomiting and diarrhea after new medication. He is dehydrated with elevated lactic acid and anion gap. Blood sugar is 200. Urinalysis pending. Leukocytosis of 17.5.  Patient will need admission  for further hydration.  Noted to be hypertensive. He has a history of same and is  without chest pain or shortness of breath today.  And his baseline. Patient has not taken his hypertensive medications in 2 days because his persistent vomiting. No emesis here in the department. We'll give home medications and reassess.   The patient was discussed with and seen by Dr. Wyvonnia Dusky who agrees with the treatment plan.   Final Clinical Impressions(s) / ED Diagnoses   Final diagnoses:  Non-intractable vomiting with nausea, unspecified vomiting type  Essential hypertension    New Prescriptions New Prescriptions   No medications on file     Agapito Games 05/14/17 6701    Ezequiel Essex, MD 05/14/17 [phone]

## 2017-05-15 ENCOUNTER — Encounter: Payer: Self-pay | Admitting: Vascular Surgery

## 2017-05-15 ENCOUNTER — Observation Stay (HOSPITAL_COMMUNITY): Payer: Medicare HMO

## 2017-05-15 DIAGNOSIS — I10 Essential (primary) hypertension: Secondary | ICD-10-CM | POA: Diagnosis not present

## 2017-05-15 DIAGNOSIS — Z794 Long term (current) use of insulin: Secondary | ICD-10-CM | POA: Diagnosis not present

## 2017-05-15 DIAGNOSIS — E781 Pure hyperglyceridemia: Secondary | ICD-10-CM | POA: Diagnosis present

## 2017-05-15 DIAGNOSIS — R112 Nausea with vomiting, unspecified: Secondary | ICD-10-CM | POA: Diagnosis present

## 2017-05-15 DIAGNOSIS — I130 Hypertensive heart and chronic kidney disease with heart failure and stage 1 through stage 4 chronic kidney disease, or unspecified chronic kidney disease: Secondary | ICD-10-CM | POA: Diagnosis present

## 2017-05-15 DIAGNOSIS — E1142 Type 2 diabetes mellitus with diabetic polyneuropathy: Secondary | ICD-10-CM | POA: Diagnosis present

## 2017-05-15 DIAGNOSIS — I5042 Chronic combined systolic (congestive) and diastolic (congestive) heart failure: Secondary | ICD-10-CM | POA: Diagnosis present

## 2017-05-15 DIAGNOSIS — I25110 Atherosclerotic heart disease of native coronary artery with unstable angina pectoris: Secondary | ICD-10-CM | POA: Diagnosis not present

## 2017-05-15 DIAGNOSIS — I6523 Occlusion and stenosis of bilateral carotid arteries: Secondary | ICD-10-CM | POA: Diagnosis not present

## 2017-05-15 DIAGNOSIS — I480 Paroxysmal atrial fibrillation: Secondary | ICD-10-CM | POA: Diagnosis present

## 2017-05-15 DIAGNOSIS — N183 Chronic kidney disease, stage 3 (moderate): Secondary | ICD-10-CM | POA: Diagnosis present

## 2017-05-15 DIAGNOSIS — R1111 Vomiting without nausea: Secondary | ICD-10-CM | POA: Diagnosis not present

## 2017-05-15 DIAGNOSIS — T476X5A Adverse effect of antidiarrheal drugs, initial encounter: Secondary | ICD-10-CM | POA: Diagnosis present

## 2017-05-15 DIAGNOSIS — I350 Nonrheumatic aortic (valve) stenosis: Secondary | ICD-10-CM | POA: Diagnosis not present

## 2017-05-15 DIAGNOSIS — G43A1 Cyclical vomiting, intractable: Secondary | ICD-10-CM | POA: Diagnosis not present

## 2017-05-15 DIAGNOSIS — I447 Left bundle-branch block, unspecified: Secondary | ICD-10-CM | POA: Diagnosis present

## 2017-05-15 DIAGNOSIS — K219 Gastro-esophageal reflux disease without esophagitis: Secondary | ICD-10-CM | POA: Diagnosis present

## 2017-05-15 DIAGNOSIS — E1110 Type 2 diabetes mellitus with ketoacidosis without coma: Secondary | ICD-10-CM | POA: Diagnosis present

## 2017-05-15 DIAGNOSIS — Z6832 Body mass index (BMI) 32.0-32.9, adult: Secondary | ICD-10-CM | POA: Diagnosis not present

## 2017-05-15 DIAGNOSIS — I739 Peripheral vascular disease, unspecified: Secondary | ICD-10-CM | POA: Diagnosis not present

## 2017-05-15 DIAGNOSIS — E860 Dehydration: Secondary | ICD-10-CM | POA: Diagnosis present

## 2017-05-15 DIAGNOSIS — E1122 Type 2 diabetes mellitus with diabetic chronic kidney disease: Secondary | ICD-10-CM | POA: Diagnosis present

## 2017-05-15 DIAGNOSIS — T383X5A Adverse effect of insulin and oral hypoglycemic [antidiabetic] drugs, initial encounter: Secondary | ICD-10-CM | POA: Diagnosis present

## 2017-05-15 DIAGNOSIS — I429 Cardiomyopathy, unspecified: Secondary | ICD-10-CM | POA: Diagnosis present

## 2017-05-15 DIAGNOSIS — K8020 Calculus of gallbladder without cholecystitis without obstruction: Secondary | ICD-10-CM | POA: Diagnosis present

## 2017-05-15 DIAGNOSIS — T887XXA Unspecified adverse effect of drug or medicament, initial encounter: Secondary | ICD-10-CM | POA: Diagnosis not present

## 2017-05-15 DIAGNOSIS — R1013 Epigastric pain: Secondary | ICD-10-CM | POA: Diagnosis present

## 2017-05-15 DIAGNOSIS — R079 Chest pain, unspecified: Secondary | ICD-10-CM | POA: Diagnosis not present

## 2017-05-15 DIAGNOSIS — R197 Diarrhea, unspecified: Secondary | ICD-10-CM | POA: Diagnosis present

## 2017-05-15 DIAGNOSIS — R0789 Other chest pain: Secondary | ICD-10-CM | POA: Diagnosis present

## 2017-05-15 DIAGNOSIS — E1143 Type 2 diabetes mellitus with diabetic autonomic (poly)neuropathy: Secondary | ICD-10-CM | POA: Diagnosis present

## 2017-05-15 DIAGNOSIS — Z952 Presence of prosthetic heart valve: Secondary | ICD-10-CM | POA: Diagnosis not present

## 2017-05-15 DIAGNOSIS — D72829 Elevated white blood cell count, unspecified: Secondary | ICD-10-CM | POA: Diagnosis present

## 2017-05-15 DIAGNOSIS — I200 Unstable angina: Secondary | ICD-10-CM | POA: Diagnosis not present

## 2017-05-15 LAB — BASIC METABOLIC PANEL
Anion gap: 10 (ref 5–15)
BUN: 35 mg/dL — ABNORMAL HIGH (ref 6–20)
CHLORIDE: 104 mmol/L (ref 101–111)
CO2: 25 mmol/L (ref 22–32)
Calcium: 9.3 mg/dL (ref 8.9–10.3)
Creatinine, Ser: 1.42 mg/dL — ABNORMAL HIGH (ref 0.61–1.24)
GFR calc non Af Amer: 49 mL/min — ABNORMAL LOW (ref >60)
GFR, EST AFRICAN AMERICAN: 57 mL/min — AB (ref >60)
Glucose, Bld: 224 mg/dL — ABNORMAL HIGH (ref 65–99)
POTASSIUM: 4.5 mmol/L (ref 3.5–5.1)
SODIUM: 139 mmol/L (ref 135–145)

## 2017-05-15 LAB — GLUCOSE, CAPILLARY
GLUCOSE-CAPILLARY: 201 mg/dL — AB (ref 65–99)
Glucose-Capillary: 197 mg/dL — ABNORMAL HIGH (ref 65–99)
Glucose-Capillary: 154 mg/dL — ABNORMAL HIGH (ref 65–99)
Glucose-Capillary: 166 mg/dL — ABNORMAL HIGH (ref 65–99)

## 2017-05-15 LAB — CBC
HEMATOCRIT: 44.5 % (ref 39.0–52.0)
HEMOGLOBIN: 14.8 g/dL (ref 13.0–17.0)
MCH: 28.4 pg (ref 26.0–34.0)
MCHC: 33.3 g/dL (ref 30.0–36.0)
MCV: 85.2 fL (ref 78.0–100.0)
PLATELETS: 233 10*3/uL (ref 150–400)
RBC: 5.22 MIL/uL (ref 4.22–5.81)
RDW: 13.2 % (ref 11.5–15.5)
WBC: 18.6 10*3/uL — AB (ref 4.0–10.5)

## 2017-05-15 LAB — HEMOGLOBIN A1C
HEMOGLOBIN A1C: 8.4 % — AB (ref 4.8–5.6)
Mean Plasma Glucose: 194.38 mg/dL

## 2017-05-15 MED ORDER — SODIUM CHLORIDE 0.9 % IV SOLN
INTRAVENOUS | Status: DC
  Administered 2017-05-15 – 2017-05-16 (×3): via INTRAVENOUS

## 2017-05-15 MED ORDER — INSULIN GLARGINE 100 UNIT/ML ~~LOC~~ SOLN
10.0000 [IU] | Freq: Every day | SUBCUTANEOUS | Status: DC
  Administered 2017-05-15: 10 [IU] via SUBCUTANEOUS
  Filled 2017-05-15: qty 0.1

## 2017-05-15 MED ORDER — METOPROLOL TARTRATE 5 MG/5ML IV SOLN
5.0000 mg | Freq: Four times a day (QID) | INTRAVENOUS | Status: DC
  Administered 2017-05-15 – 2017-05-16 (×5): 5 mg via INTRAVENOUS
  Filled 2017-05-15 (×5): qty 5

## 2017-05-15 MED ORDER — ONDANSETRON HCL 4 MG/2ML IJ SOLN
4.0000 mg | INTRAMUSCULAR | Status: DC | PRN
  Administered 2017-05-16: 4 mg via INTRAVENOUS
  Filled 2017-05-15: qty 2

## 2017-05-15 MED ORDER — SENNOSIDES-DOCUSATE SODIUM 8.6-50 MG PO TABS
2.0000 | ORAL_TABLET | Freq: Every day | ORAL | Status: DC
  Administered 2017-05-15: 1 via ORAL
  Administered 2017-05-16 – 2017-05-17 (×2): 2 via ORAL
  Filled 2017-05-15 (×3): qty 2

## 2017-05-15 MED ORDER — LIVING WELL WITH DIABETES BOOK
Freq: Once | Status: AC
  Administered 2017-05-15: 11:00:00
  Filled 2017-05-15: qty 1

## 2017-05-15 NOTE — Progress Notes (Signed)
Inpatient Diabetes Program Recommendations  AACE/ADA: New Consensus Statement on Inpatient Glycemic Control (2015)  Target Ranges:  Prepandial:   less than 140 mg/dL      Peak postprandial:   less than 180 mg/dL (1-2 hours)      Critically ill patients:  140 - 180 mg/dL   Results for Thomas Wright, Thomas Wright (MRN [id number]) as of 05/15/2017 12:58  Ref. Range 05/14/2017 12:00 05/14/2017 17:13 05/14/2017 21:28  Glucose-Capillary Latest Ref Range: 65 - 99 mg/dL 211 (H) 250 (H) 211 (H)   Results for Thomas Wright, Thomas Wright (MRN [id number]) as of 05/15/2017 12:58  Ref. Range 05/15/2017 07:46 05/15/2017 12:04  Glucose-Capillary Latest Ref Range: 65 - 99 mg/dL 201 (H) 197 (H)    Admit N&V after Starting Trulicity at home.   History: DM, CKD, CHF  Home Meds: Toujeo 28 units QHS + Trulicity 1.5 mg Qweek + Jardiance 10 mg daily + Metformin ER 500 mg BID + Regular Insulin 6 units Breakfast/ 6 units Lunch/ 8 units Dinner (stopped on Saturday when he started Trulicity per Dr. Altheimer's orders)  Current orders: Lantus 10 units QHS     Novolog Sensitive Correction Scale/ SSI (0-9 units) TID AC + HS   ENDO: Dr. Altheimer (last seen 05/03/17)      MD- Please consider the following in-hospital insulin adjustments:  1. Increase Novolog SSI to Moderate scale (0-15 units)   2. If fasting glucose elevated tomorrow AM (08/30), consider increasing Lantus by 20%     Spoke with patient this AM.  Stated he developed severe Nausea and Vomiting on Tuesday after starting Trulicity 1.5 mg Qweek on Saturday per his MD's orders (Dr. Altheimer).  Came to ED for treatment.  Patient already taking Toujeo 28 units QHS at home.  Was also taking Regular Insulin with meals but was told to stop the Regular insulin with meals when he started the Trulicity.  Below are the instructions Dr. Altheimer gave to patient at last visit (05/03/17): Diabetes Medication Instructions:  Increase metformin ER (Extended Release) 500 mg with meals to  twice a day.  Continue Jardiance 10 mg before breakfast daily. See Jardiance brochure. It's important to take Jardiance before your first meal of the day. Note side effects of Jardiance can include dizziness, dehydration, urinary infections, and genital yeast infections. Maintain adequate hydration. Hold Jardiance during any potentially dehydrating illnesses or any time you are unable to eat or drink.  Continue Toujeo (SoloStar) 28 units at bedtime daily. Reduce dose if you're having low sugars.  HOLD: Novolin R.  Add Trulicity injections 1.5 mg weekly. Reviewed proper dosing, potential side effects and safety information. Given starter kit.   Encouraged pt to call Dr. Altheimer's office to see if he should restart his Regular insulin with meals after discharge since he will no longer be taking the Trulicity at time of discharge.      --Will follow patient during hospitalization--   [place]  RN, MSN, CDE Diabetes Coordinator Inpatient Glycemic Control Team Team Pager: [phone] (8a-5p)  

## 2017-05-15 NOTE — Progress Notes (Signed)
[facility]                                                                              Patient Demographics  Thomas Wright, is a 71 y.o. male, DOB - [birth year], GBT:[id number]  Admit date - 05/14/2017   Admitting Physician Waldemar Dickens, MD  Outpatient Primary MD for the patient is Leighton Ruff, MD  Outpatient specialists:   LOS - 0  days   Medical records reviewed and are as summarized below:    Chief Complaint  Patient presents with  . Nausea  . Emesis       Brief summary   Thomas Wright is a 71 y.o. male with medical history significant of diabetes, systolic and diastolic congestive heart failure, depression, hyperlipidemia, hypertension, PAF, status post aVR. Presenting with 3 day history of nausea vomiting and abdominal discomfort. Initially started out very mild but becoming more severe and constant. Symptoms started a few hours after starting first injection of Trulicity. Patient also reported some loose stools. Patient states he also has stopped his insulin as he was told not to take along the trulicity. Had little oral intake over this period of time due to the fact that every time he eats or drinks, doesn't become more nauseous and had nonbloody nonbilious emesis. Denied chest pain, palpitations, fevers, symptoms, headache, dysuria, frequency, focal neurological deficit, rash.   Assessment & Plan    Principal Problem:   Intractable nausea and vomiting: possibly due to medication reaction from trulicity versus gastroenteritis, rule out any other intra-abdominal pathology - KUB normal with no ileus or obstruction - creatinine improving, still has Leukocytosis, obtain CT abdomen and pelvis for further workup, blood cultures 2 - Lipase normal on admission LFTs normal  - continue IV fluid hydration, antiemetics   Active Problems:  Metabolic acidosis with mild DKA - Anion gap 19, with lactic acidosis at the time of admission likely due to  significant dehydration and poor oral intake - continue IV fluids, sliding scale insulin,clear liquid diet - Place on Lantus [id number] qhs , no trulicity at discharge     PAF (paroxysmal atrial fibrillation) (HCC) - currently rate controlled, continue amiodarone, Coreg - resume eliquis once consistently taking PO, currently on Lovenox     Chronic combined systolic and diastolic congestive heart failure (HCC) - currently stable, last echo with EF of 20% and grade 3 diastolic dysfunction - Monitor for any fluid overload    CKD (chronic kidney disease), stage III - Continue gentle hydration, creatinine improving, baseline 1.3  Code Status: full   DVT Prophylaxis:  Lovenox Family Communication: Discussed in detail with the patient, all imaging results, lab results explained to the patient   Disposition Plan:  Time Spent in minutes   25 minutes  Procedures:  KUB   Consultants:   None   Antimicrobials:      Medications  Scheduled Meds: . amiodarone  100 mg Oral Daily  . atorvastatin  10 mg Oral q morning - 10a  . carvedilol  6.25 mg Oral BID WC  . enoxaparin (LOVENOX) injection  120 mg Subcutaneous Q12H  . furosemide  40 mg  Oral Daily  . insulin aspart  0-5 Units Subcutaneous QHS  . insulin aspart  0-9 Units Subcutaneous TID WC  . senna-docusate  2 tablet Oral QHS  . sucralfate  1 g Oral BID   Continuous Infusions: . sodium chloride 1,000 mL (05/14/17 0801)  . sodium chloride 100 mL/hr at 05/15/17 0657  . famotidine (PEPCID) IV Stopped (05/14/17 2253)   PRN Meds:.acetaminophen **OR** acetaminophen, hydrALAZINE, metoprolol tartrate, [DISCONTINUED] ondansetron **OR** ondansetron (ZOFRAN) IV, promethazine   Antibiotics   Anti-infectives    None        Subjective:   Cecil Cobbs was seen and examined today. Still complaining of nausea and vomiting. Abd pain 4/10, epigastric region, intermittent, no radiation. Patient denies dizziness, chest pain, shortness of  breath,  new weakness, numbess, tingling. No acute events overnight.    Objective:   Vitals:   05/14/17 1835 05/14/17 2131 05/15/17 0608 05/15/17 0945  BP: (!) 143/69 (!) 125/58 (!) 146/64 (!) 148/68  Pulse:  87 78 82  Resp:  18 16   Temp:  97.8 F (36.6 C) 98.4 F (36.9 C)   TempSrc:  Oral Oral   SpO2:  94% 92% 98%  Weight:   109.1 kg (240 lb 8 oz)   Height:        Intake/Output Summary (Last 24 hours) at 05/15/17 1009 Last data filed at 05/15/17 0715  Gross per 24 hour  Intake             3235 ml  Output             1500 ml  Net             1735 ml     Wt Readings from Last 3 Encounters:  05/15/17 109.1 kg (240 lb 8 oz)  04/23/17 117.9 kg (260 lb)  04/17/17 117.9 kg (260 lb)     Exam  General: Alert and oriented x 3, NAD  Eyes:  HEENT:  Atraumatic, normocephalic, normal oropharynx  Cardiovascular: S1 S2 auscultated, no rubs, murmurs or gallops. Regular rate and rhythm.  Respiratory: Clear to auscultation bilaterally, no wheezing, rales or rhonchi  Gastrointestinal: Soft, mild epigastric tenderness to deep palpation, nondistended, + bowel sounds  Ext: no pedal edema bilaterally  Neuro: AAOx3, Cr N's II- XII. Strength 5/5 upper and lower extremities bilaterally, speech clear, sensations grossly intact  Musculoskeletal: No digital cyanosis, clubbing  Skin: No rashes  Psych: Normal affect and demeanor, alert and oriented x3    Data Reviewed:  I have personally reviewed following labs and imaging studies  Micro Results No results found for this or any previous visit (from the past 240 hour(s)).  Radiology Reports Dg Chest 2 View  Result Date: 05/14/2017 CLINICAL DATA:  Left chest pain, history of hypertension and diabetes, followup of small anterior mediastinal lesion EXAM: CHEST  2 VIEW COMPARISON:  CT chest of 02/26/2017 and chest x-ray of 01/30/2016 FINDINGS: The lungs are not well aerated with persistent chronic elevation of the right hemidiaphragm  and some resulting right basilar volume loss. Mediastinal and hilar contours are unremarkable with cardiomegaly remaining stable. Aortic valve replacement is present. Median sternotomy sutures are noted. No acute bony abnormality is seen. IMPRESSION: Poor inspiration with mild volume loss at the right lung base and elevation of the right hemidiaphragm. No definite active process. Electronically Signed   By: Ivar Drape M.D.   On: 05/14/2017 09:06   Dg Abd 1 View  Result Date: 05/14/2017 CLINICAL DATA:  Left-sided  abdominal pain for several days, initial encounter EXAM: ABDOMEN - 1 VIEW COMPARISON:  None. FINDINGS: Degenerative changes of lumbar spine are seen. Scattered large and small bowel gas is noted. Calcification of the vas deferens is noted bilaterally. No abnormal mass or abnormal calcifications are seen. IMPRESSION: No acute abnormality noted. Electronically Signed   By: Inez Catalina M.D.   On: 05/14/2017 08:32   Nm Pet Image Initial (pi) Skull Base To Thigh  Result Date: 05/07/2017 CLINICAL DATA:  Initial treatment strategy for mediastinal mass. EXAM: NUCLEAR MEDICINE PET SKULL BASE TO THIGH TECHNIQUE: 12.4 mCi F-18 FDG was injected intravenously. Full-ring PET imaging was performed from the skull base to thigh after the radiotracer. CT data was obtained and used for attenuation correction and anatomic localization. FASTING BLOOD GLUCOSE:  Value: 138 mg/dl COMPARISON:  Chest CT dated 02/26/2017 FINDINGS: NECK No hypermetabolic lymph nodes in the neck. Carotid atherosclerotic calcification. Several small hypodense lesions in the thyroid gland are observed without hypermetabolic activity. CHEST The low-density anterior mediastinal lesion of concern measures 1.8 by 1.4 cm on image 73/2 and has a maximum standard uptake value of 0.93. This is well below the background mediastinal blood pool activity of 3.7. No hypermetabolic adenopathy in the chest. Coronary, aortic arch, and branch vessel  atherosclerotic vascular disease. Bibasilar atelectasis versus scarring. Aortic valve prosthesis. Mild-to-moderate cardiomegaly. Prior median sternotomy. ABDOMEN/PELVIS No abnormal hypermetabolic activity within the liver, pancreas, adrenal glands, or spleen. No hypermetabolic lymph nodes in the abdomen or pelvis. Aortoiliac atherosclerotic vascular disease. Small gallstones are present dependently in the gallbladder. Exophytic hypodense lesion extending posteriorly from the right kidney lower pole appears photopenic. Calcified vas deferens. Bilateral scrotal hydroceles, right greater than left. SKELETON No focal hypermetabolic activity to suggest skeletal metastasis. Thoracolumbar spondylosis and degenerative disc disease. IMPRESSION: 1. The anterior mediastinal lesion appears relatively photopenic, and has mildly increased in size over the last several years. Possibilities include postoperative fluid collection, thymic cyst, or less likely cystic thymoma. The photopenic appearance with activity much less than the mediastinal blood pool is certainly reassuring against aggressive malignancy. I would suggest surveillance noncontrast CT of the chest in 1 years time for monitoring purposes. 2.  Aortic Atherosclerosis (ICD10-I70.0).  Coronary atherosclerosis. 3. Bibasilar atelectasis versus scarring. 4. Small bilateral scrotal hydroceles. 5. Cholelithiasis. Electronically Signed   By: Van Clines M.D.   On: 05/07/2017 09:13    Lab Data:  CBC:  Recent Labs Lab 05/14/17 0405 05/14/17 1756 05/15/17 0531  WBC 17.5* 19.5* 18.6*  NEUTROABS 14.6*  --   --   HGB 16.6 16.5 14.8  HCT 49.7 48.3 44.5  MCV 83.8 84.1 85.2  PLT 271 238 062   Basic Metabolic Panel:  Recent Labs Lab 05/14/17 0405 05/14/17 1630 05/15/17 0531  NA 139 139 139  K 4.8 4.8 4.5  CL 100* 105 104  CO2 20* 19* 25  GLUCOSE 218* 287* 224*  BUN 23* 35* 35*  CREATININE 1.39* 1.58* 1.42*  CALCIUM 10.4* 9.6 9.3    GFR: Estimated Creatinine Clearance: 63.6 mL/min (A) (by C-G formula based on SCr of 1.42 mg/dL (H)). Liver Function Tests:  Recent Labs Lab 05/14/17 0405  AST 27  ALT 19  ALKPHOS 70  BILITOT 1.5*  PROT 8.4*  ALBUMIN 4.3    Recent Labs Lab 05/14/17 0405  LIPASE 23   No results for input(s): AMMONIA in the last 168 hours. Coagulation Profile: No results for input(s): INR, PROTIME in the last 168 hours. Cardiac Enzymes: No results  for input(s): CKTOTAL, CKMB, CKMBINDEX, TROPONINI in the last 168 hours. BNP (last 3 results) No results for input(s): PROBNP in the last 8760 hours. HbA1C: No results for input(s): HGBA1C in the last 72 hours. CBG:  Recent Labs Lab 05/14/17 0353 05/14/17 1200 05/14/17 1713 05/14/17 2128 05/15/17 0746  GLUCAP 199* 211* 250* 211* 201*   Lipid Profile: No results for input(s): CHOL, HDL, LDLCALC, TRIG, CHOLHDL, LDLDIRECT in the last 72 hours. Thyroid Function Tests: No results for input(s): TSH, T4TOTAL, FREET4, T3FREE, THYROIDAB in the last 72 hours. Anemia Panel: No results for input(s): VITAMINB12, FOLATE, FERRITIN, TIBC, IRON, RETICCTPCT in the last 72 hours. Urine analysis:    Component Value Date/Time   COLORURINE YELLOW 05/14/2017 [place] 05/14/2017 0737   LABSPEC 1.027 05/14/2017 0737   PHURINE 5.0 05/14/2017 0737   GLUCOSEU >=500 (A) 05/14/2017 0737   HGBUR MODERATE (A) 05/14/2017 0737   BILIRUBINUR NEGATIVE 05/14/2017 0737   KETONESUR 20 (A) 05/14/2017 0737   PROTEINUR 100 (A) 05/14/2017 0737   UROBILINOGEN 0.2 07/28/2014 1739   NITRITE NEGATIVE 05/14/2017 0737   LEUKOCYTESUR NEGATIVE 05/14/2017 0737     Asami Lambright M.D. [facility] 05/15/2017, 10:09 AM  Pager: [phone] Between 7am to 7pm - call Pager - [phone]  After 7pm go to [email or web address] - password [email or web address]  Call night coverage person covering after 7pm

## 2017-05-16 ENCOUNTER — Ambulatory Visit: Payer: Medicare HMO | Admitting: Pulmonary Disease

## 2017-05-16 LAB — BASIC METABOLIC PANEL
Anion gap: 9 (ref 5–15)
BUN: 28 mg/dL — ABNORMAL HIGH (ref 6–20)
CO2: 25 mmol/L (ref 22–32)
Calcium: 8.7 mg/dL — ABNORMAL LOW (ref 8.9–10.3)
Chloride: 107 mmol/L (ref 101–111)
Creatinine, Ser: 1.07 mg/dL (ref 0.61–1.24)
GFR calc Af Amer: >60 mL/min (ref >60)
GFR calc non Af Amer: >60 mL/min (ref >60)
Glucose, Bld: 159 mg/dL — ABNORMAL HIGH (ref 65–99)
Potassium: 3.7 mmol/L (ref 3.5–5.1)
Sodium: 141 mmol/L (ref 135–145)

## 2017-05-16 LAB — TROPONIN I
Troponin I: 0.06 ng/mL (ref <0.03)
Troponin I: 0.05 ng/mL (ref <0.03)

## 2017-05-16 LAB — GLUCOSE, CAPILLARY
Glucose-Capillary: 167 mg/dL — ABNORMAL HIGH (ref 65–99)
Glucose-Capillary: 167 mg/dL — ABNORMAL HIGH (ref 65–99)
Glucose-Capillary: 179 mg/dL — ABNORMAL HIGH (ref 65–99)
Glucose-Capillary: 158 mg/dL — ABNORMAL HIGH (ref 65–99)

## 2017-05-16 LAB — CBC
HCT: 45.1 % (ref 39.0–52.0)
HEMOGLOBIN: 14.8 g/dL (ref 13.0–17.0)
MCH: 28 pg (ref 26.0–34.0)
MCHC: 32.8 g/dL (ref 30.0–36.0)
MCV: 85.4 fL (ref 78.0–100.0)
Platelets: 250 10*3/uL (ref 150–400)
RBC: 5.28 MIL/uL (ref 4.22–5.81)
RDW: 13.4 % (ref 11.5–15.5)
WBC: 10.5 10*3/uL (ref 4.0–10.5)

## 2017-05-16 MED ORDER — SODIUM CHLORIDE 0.9 % IV SOLN: 1000.0000 mL | INTRAVENOUS | Status: DC

## 2017-05-16 MED ORDER — PANTOPRAZOLE SODIUM 40 MG IV SOLR
40.0000 mg | Freq: Two times a day (BID) | INTRAVENOUS | Status: DC
  Administered 2017-05-16 – 2017-05-18 (×5): 40 mg via INTRAVENOUS
  Filled 2017-05-16 (×5): qty 40

## 2017-05-16 MED ORDER — SODIUM CHLORIDE 0.9 % IV SOLN
1000.0000 mL | INTRAVENOUS | Status: DC
  Administered 2017-05-16 – 2017-05-17 (×2): 1000 mL via INTRAVENOUS

## 2017-05-16 MED ORDER — NITROGLYCERIN 0.4 MG SL SUBL
0.4000 mg | SUBLINGUAL_TABLET | SUBLINGUAL | Status: DC | PRN
  Administered 2017-05-16: 0.4 mg via SUBLINGUAL
  Filled 2017-05-16: qty 1

## 2017-05-16 MED ORDER — SODIUM CHLORIDE 0.9 % IV BOLUS (SEPSIS)
500.0000 mL | Freq: Once | INTRAVENOUS | Status: AC
  Administered 2017-05-16: 500 mL via INTRAVENOUS

## 2017-05-16 MED ORDER — ENOXAPARIN SODIUM 120 MG/0.8ML ~~LOC~~ SOLN
110.0000 mg | Freq: Two times a day (BID) | SUBCUTANEOUS | Status: DC
  Administered 2017-05-16 – 2017-05-17 (×3): 110 mg via SUBCUTANEOUS
  Filled 2017-05-16 (×4): qty 0.73

## 2017-05-16 MED ORDER — INSULIN GLARGINE 100 UNIT/ML ~~LOC~~ SOLN
14.0000 [IU] | Freq: Every day | SUBCUTANEOUS | Status: DC
  Administered 2017-05-16 – 2017-05-18 (×2): 14 [IU] via SUBCUTANEOUS
  Filled 2017-05-16 (×4): qty 0.14

## 2017-05-16 NOTE — Evaluation (Signed)
Physical Therapy Evaluation Patient Details Name: Thomas Wright MRN: [id number] DOB: [birth year] Today's Date: 05/16/2017   History of Present Illness  Pt is a 71 y/o male admitted secondary to intractable nausea/vomiting. CT performed and showed cholelithiasis. CT of the chest showed mild volume loss at R base. PMH includes HTN, DM, obesity, CKD, a fib, CHF, cardiomyopathy, s/p R BKA, and R finger amputation, and s/p cardiac valve replacement.   Clinical Impression  Pt admitted secondary to problem above with deficits below. PTA, pt was independent with functional mobility using RW or cane and R prosthesis. Upon eval, pt limited by nausea/lightheadness, decreased balance, and weakness. Pt requiring min guard to min A for mobility this session. Discussed HHPT to increase independence and safety with mobility at d/c and pt agreeable. Has all necessary DME at home and girlfriend can assist at home. Will continue to follow acutely and progress mobility according to pt tolerance.     Follow Up Recommendations Home health PT;Supervision for mobility/OOB    Equipment Recommendations  None recommended by PT (has all necessary DME )    Recommendations for Other Services       Precautions / Restrictions Precautions Precautions: Fall Precaution Comments: R BKA; has prosthesis  Restrictions Weight Bearing Restrictions: No      Mobility  Bed Mobility Overal bed mobility: Needs Assistance Bed Mobility: Supine to Sit     Supine to sit: Min assist     General bed mobility comments: Min A for trunk elevation.   Transfers Overall transfer level: Needs assistance Equipment used: Rolling walker (2 wheeled) Transfers: Sit to/from Stand Sit to Stand: Min assist;From elevated surface         General transfer comment: Min A for lift assist and steadying. Pt reports surfaces at home are higher, so used elevated surface.   Ambulation/Gait Ambulation/Gait assistance: Min guard Ambulation  Distance (Feet): 1 Feet Assistive device: Rolling walker (2 wheeled) Gait Pattern/deviations: Step-through pattern;Decreased stride length;Trunk flexed Gait velocity: Decreased Gait velocity interpretation: Below normal speed for age/gender General Gait Details: Able to tolerate short distance to chair with min guard for safety. Verbal cues for upright posture. Distance limited secondary to nausea/lightheadedness.   Stairs            Wheelchair Mobility    Modified Rankin (Stroke Patients Only)       Balance Overall balance assessment: Needs assistance Sitting-balance support: No upper extremity supported;Feet supported Sitting balance-Leahy Scale: Good Sitting balance - Comments: Able to don R prosthesis in sitting    Standing balance support: Bilateral upper extremity supported;During functional activity Standing balance-Leahy Scale: Poor Standing balance comment: Reliant on RW for stability                              Pertinent Vitals/Pain Pain Assessment: 0-10 Pain Score: 4  Pain Location: stomach  Pain Descriptors / Indicators: Discomfort Pain Intervention(s): Limited activity within patient's tolerance;Monitored during session;Repositioned    Home Living Family/patient expects to be discharged to:: Private residence Living Arrangements: Spouse/significant other Available Help at Discharge: Family;Available 24 hours/day Type of Home: House Home Access: Ramped entrance     Home Layout: One level Home Equipment: Walker - 2 wheels;Cane - single point;Bedside commode;Tub bench;Wheelchair - manual      Prior Function Level of Independence: Independent with assistive device(s)         Comments: Reports he used RW for longer distances and cane for shorter distances.  Uses R prosthesis at baseline.      Hand Dominance        Extremity/Trunk Assessment   Upper Extremity Assessment Upper Extremity Assessment: Defer to OT evaluation    Lower  Extremity Assessment Lower Extremity Assessment: Generalized weakness;RLE deficits/detail RLE Deficits / Details: R BKA at baseline     Cervical / Trunk Assessment Cervical / Trunk Assessment: Normal  Communication   Communication: No difficulties  Cognition Arousal/Alertness: Awake/alert Behavior During Therapy: WFL for tasks assessed/performed Overall Cognitive Status: Within Functional Limits for tasks assessed                                        General Comments General comments (skin integrity, edema, etc.): Pt reports feeling weaker. Discussed HHPT with pt and pt agreeable.     Exercises     Assessment/Plan    PT Assessment Patient needs continued PT services  PT Problem List Decreased strength;Decreased balance;Decreased activity tolerance;Decreased mobility;Decreased knowledge of use of DME;Pain       PT Treatment Interventions DME instruction;Gait training;Functional mobility training;Therapeutic activities;Therapeutic exercise;Balance training;Neuromuscular re-education;Patient/family education    PT Goals (Current goals can be found in the Care Plan section)  Acute Rehab PT Goals Patient Stated Goal: to feel better  PT Goal Formulation: With patient Time For Goal Achievement: 05/23/17 Potential to Achieve Goals: Good    Frequency Min 3X/week   Barriers to discharge        Co-evaluation               AM-PAC PT "6 Clicks" Daily Activity  Outcome Measure Difficulty turning over in bed (including adjusting bedclothes, sheets and blankets)?: A Little Difficulty moving from lying on back to sitting on the side of the bed? : Unable Difficulty sitting down on and standing up from a chair with arms (e.g., wheelchair, bedside commode, etc,.)?: Unable Help needed moving to and from a bed to chair (including a wheelchair)?: A Little Help needed walking in hospital room?: A Little Help needed climbing 3-5 steps with a railing? : A Lot 6  Click Score: 13    End of Session Equipment Utilized During Treatment: Gait belt Activity Tolerance: Treatment limited secondary to medical complications (Comment) (nausea/lightheadedness) Patient left: in chair;with call bell/phone within reach Nurse Communication: Mobility status;Other (comment) (nausea/lightheadedness) PT Visit Diagnosis: Muscle weakness (generalized) (M62.81);Unsteadiness on feet (R26.81)    Time: 6160-7371 PT Time Calculation (min) (ACUTE ONLY): 18 min   Charges:   PT Evaluation $PT Eval Moderate Complexity: 1 Mod     PT G Codes:        Leighton Ruff, PT, DPT  Acute Rehabilitation Services  Pager: [phone]   Rudean Hitt 05/16/2017, 2:12 PM

## 2017-05-16 NOTE — Consult Note (Signed)
[facility] Consult  Referring Provider: Mendel Corning, MD Primary Care Physician:  Leighton Ruff, MD Primary Gastroenterologist: Dr.Schooler  Reason for Consultation:  Intractable nausea and vomiting  HPI: Thomas Wright is a 71 y.o.Caucasian male admitted 3 days ago with nausea vomiting and abdominal pain. Patient states he was in his usual state of health until last Saturday, when within 2-3 hours of starting new diabetes medication Trulicity. Patient reports no bowel movement for the last 5 days, which is abnormal for him, as usually has one per day. He was scheduled for a colonoscopy in the next 10 days with Dr. Michail Sermon, performed for high risk screening because of family history of colon cancer( father, grandmother, maternal aunt, as performed 5 years ago). Patient also complains of generalized abdominal pain which is now decreased in intensity. He is now able to tolerate clear liquid diet. He denies difficulty swallowing or pain on swallowing. He denies unintentional weight loss, early satiety or loss of appetite.   Past Medical History:  Diagnosis Date  . Arthritis    .  Marland Kitchen Cardiomyopathy ([place])    EF originally 20%-now 45%, no CAD on cath  . Chronic combined systolic and diastolic CHF (congestive heart failure) ([place])    a. 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 Reported h/o cath in 2008 without significant CAD.  Marland Kitchen CKD (chronic kidney disease), stage III   . Depression    "a little right now" (04/19/2015)  . Hyperlipidemia   . Hypertension   . Hypertriglyceridemia   . Nausea & vomiting 04/2017  . PAF (paroxysmal atrial fibrillation) ([place])    a. Postop 2008. b. recurrent afib 08/2014 with LAA clot noted on TEE. EF was down again at 25%. He was anticoagulated with Eliquis and placed on Amiodarone.He ultimately underwent TEE-DCCV 08/2014.  . S/P aortic valve replacement with bioprosthetic  valve    a. bioprosthetic AVR in 05/2007 in [place].  . Type II diabetes mellitus ([place])     Past Surgical History:  Procedure Laterality Date  . AMPUTATION Right 07/29/2014   Procedure: AMPUTATION RIGHT LONG FINGER;  Surgeon: [name], MD;  Location: [place];  Service: Orthopedics;  Laterality: Right;  . AMPUTATION Right 01/13/2016   Procedure: RIGHT BELOW KNEE AMPUTATION;  Surgeon: Newt Minion, MD;  Location: [place];  Service: Orthopedics;  Laterality: Right;  . AORTIC VALVE REPLACEMENT  05/2007   with tissue graft  . APPLICATION OF WOUND VAC Right 01/13/2016   Procedure: APPLICATION OF WOUND VAC;  Surgeon: Newt Minion, MD;  Location: [place];  Service: Orthopedics;  Laterality: Right;  . CARDIAC CATHETERIZATION  "several"  . CARDIAC VALVE REPLACEMENT    . CARDIOVERSION N/A 07/23/2014   Procedure: CARDIOVERSION;  Surgeon: Josue Hector, MD;  Location: [place];  Service: Cardiovascular;  Laterality: N/A;  . CARDIOVERSION N/A 09/01/2014   Procedure: CARDIOVERSION;  Surgeon: [name], MD;  Location: [facility] ENDOSCOPY;  Service: Cardiovascular;  Laterality: N/A;  . I&D EXTREMITY Right 05/05/2014   Procedure: IRRIGATION AND DEBRIDEMENT EXTREMITY;  Surgeon: [name], MD;  Location: [place];  Service: Orthopedics;  Laterality: Right;  . TEE WITHOUT CARDIOVERSION N/A 07/23/2014   Procedure: TRANSESOPHAGEAL ECHOCARDIOGRAM (TEE);  Surgeon: Josue Hector, MD;  Location: [place];  Service: Cardiovascular;  Laterality: N/A;  . TEE WITHOUT CARDIOVERSION N/A 09/01/2014   Procedure: TRANSESOPHAGEAL ECHOCARDIOGRAM (TEE);  Surgeon: [name], MD;  Location:  [facility] ENDOSCOPY;  Service: Cardiovascular;  Laterality: N/A;  . TEE WITHOUT CARDIOVERSION N/A 04/17/2017   Procedure: TRANSESOPHAGEAL ECHOCARDIOGRAM (TEE);  Surgeon: Jerline Pain, MD;  Location: [facility] ENDOSCOPY;  Service: Cardiovascular;  Laterality: N/A;  . TONSILLECTOMY  1952    Prior to Admission medications   Medication Sig Start  Date End Date Taking? Authorizing Provider  amiodarone (PACERONE) 100 MG tablet Take 1 tablet (100 mg total) by mouth daily. 03/14/17  Yes Jerline Pain, MD  apixaban (ELIQUIS) 5 MG TABS tablet Take 1 tablet (5 mg total) by mouth 2 (two) times daily. 01/21/17  Yes Jerline Pain, MD  atorvastatin (LIPITOR) 10 MG tablet Take 1 tablet (10 mg total) by mouth every morning. 01/21/17  Yes Jerline Pain, MD  carvedilol (COREG) 6.25 MG tablet Take 6.25 mg by mouth 2 (two) times daily with a meal.   Yes [provider]  DHA-EPA-Coenzyme Q10-Vitamin E (CO Q-10 VITAMIN E FISH OIL PO) Take 1,000 mg by mouth 2 (two) times daily.   Yes [provider]  furosemide (LASIX) 40 MG tablet Take 1 tablet (40 mg total) by mouth daily. 02/03/16  Yes Angiulli, Lavon Paganini, PA-C  JARDIANCE 10 MG TABS tablet Take 10 mg by mouth daily. 04/08/17  Yes [provider]  metFORMIN (GLUCOPHAGE-XR) 500 MG 24 hr tablet Take 500 mg by mouth 2 (two) times daily with a meal.   Yes [provider]  Multiple Vitamin (MULTIVITAMIN) tablet Take 1 tablet by mouth daily.   Yes [provider]  TRULICITY 1.5 EV/0.3JK SOPN Inject 1.5 mg into the skin once a week. 05/06/17  Yes [provider]  ACCU-CHEK SMARTVIEW test strip USE AS INSTRUCTED TO CHECK BLOOD SUGAR FOUR TIMES DAILY 12/24/16   Elayne Snare, MD  Blood Glucose Monitoring Suppl (ACCU-CHEK NANO SMARTVIEW) w/Device KIT USE TO CHECK BLOOD SUGAR FOUR TIMES PER DAY (NEEDS APPOINTMENT) 03/13/16   Elayne Snare, MD  Insulin Pen Needle 31G X 5 MM MISC 1 each by Does not apply route 4 (four) times daily -  before meals and at bedtime. 01/17/16   Lavina Hamman, MD  NOVOLIN R 100 UNIT/ML injection INJECT  6 UNITS BEFORE BREAKFAST  AND  LUNCH  AND  8 UNITS BEFORE SUPPER (DISCARD AND BEGIN A NEW VIAL AFTER 42 DAYS) Patient not taking: Reported on 05/14/2017 10/17/16   Elayne Snare, MD  TOUJEO SOLOSTAR 300 UNIT/ML SOPN Inject 28 Units into the skin daily. 05/06/17    [provider]    Current Facility-Administered Medications  Medication Dose Route Frequency Provider Last Rate Last Dose  . 0.9 %  sodium chloride infusion  1,000 mL Intravenous Continuous Rai, Ripudeep K, MD 75 mL/hr at 05/16/17 1003 1,000 mL at 05/16/17 1003  . acetaminophen (TYLENOL) tablet 650 mg  650 mg Oral Q6H PRN Waldemar Dickens, MD   650 mg at 05/16/17 0840   Or  . acetaminophen (TYLENOL) suppository 650 mg  650 mg Rectal Q6H PRN Waldemar Dickens, MD      . amiodarone (PACERONE) tablet 100 mg  100 mg Oral Daily Waldemar Dickens, MD   100 mg at 05/16/17 0938  . enoxaparin (LOVENOX) injection 110 mg  110 mg Subcutaneous Q12H Rai, Ripudeep K, MD   110 mg at 05/16/17 1829  . famotidine (PEPCID) IVPB 20 mg premix  20 mg Intravenous Q12H Waldemar Dickens, MD   Stopped at 05/16/17 [phone]  . furosemide (LASIX) tablet 40 mg  40  mg Oral Daily Waldemar Dickens, MD   40 mg at 05/16/17 9024  . hydrALAZINE (APRESOLINE) injection 5-10 mg  5-10 mg Intravenous Q4H PRN Waldemar Dickens, MD      . insulin aspart (novoLOG) injection 0-5 Units  0-5 Units Subcutaneous QHS Waldemar Dickens, MD   2 Units at 05/14/17 2145  . insulin aspart (novoLOG) injection 0-9 Units  0-9 Units Subcutaneous TID WC Waldemar Dickens, MD   2 Units at 05/16/17 1235  . insulin glargine (LANTUS) injection 14 Units  14 Units Subcutaneous QHS Rai, Ripudeep K, MD      . metoprolol tartrate (LOPRESSOR) injection 5 mg  5 mg Intravenous Q6H Opyd, Ilene Qua, MD   5 mg at 05/16/17 1236  . nitroGLYCERIN (NITROSTAT) SL tablet 0.4 mg  0.4 mg Sublingual Q5 min PRN Rai, Ripudeep K, MD   0.4 mg at 05/16/17 1608  . ondansetron (ZOFRAN) injection 4 mg  4 mg Intravenous Q4H PRN Rai, Ripudeep K, MD   4 mg at 05/16/17 0840  . pantoprazole (PROTONIX) injection 40 mg  40 mg Intravenous Q12H Rai, Ripudeep K, MD   40 mg at 05/16/17 1004  . promethazine (PHENERGAN) injection 12.5 mg  12.5 mg Intravenous Q6H PRN Waldemar Dickens, MD   12.5 mg  at 05/15/17 1353  . senna-docusate (Senokot-S) tablet 2 tablet  2 tablet Oral QHS Rai, Ripudeep K, MD   1 tablet at 05/15/17 2209  . sucralfate (CARAFATE) 1 GM/10ML suspension 1 g  1 g Oral BID Waldemar Dickens, MD   1 g at 05/16/17 0840    Allergies as of 05/14/2017 - Review Complete 05/14/2017  Allergen Reaction Noted  . Amoxicill-clarithro-omeprazole Diarrhea and Nausea And Vomiting 04/19/2015    Family History  Problem Relation Age of Onset  . Colon cancer Father   . Diabetes Father   . Liver disease Brother   . Heart murmur Child   . Congenital heart disease Other   . Diabetes Maternal Grandmother   . Heart disease Maternal Grandfather        A Fib    Social History   Social History  . Marital status: Divorced    Spouse name: N/A  . Number of children: N/A  . Years of education: N/A   Occupational History  . retired     Social History Main Topics  . Smoking status: Never Smoker  . Smokeless tobacco: Never Used  . Alcohol use 0.0 oz/week     Comment: 04/19/2015 "might have a beer or 2, 2-3 times/yr"  . Drug use: No  . Sexual activity: No   Other Topics Concern  . Not on file   Social History Narrative  . No narrative on file    Review of Systems: Positive for: GI: Described in detail in HPI.    Gen: anorexia, fatigue, weakness, malaise,Denies any fever, chills, rigors, night sweats,  involuntary weight loss, and sleep disorder CV: Denies chest pain, angina, palpitations, syncope, orthopnea, PND, peripheral edema, and claudication. Resp: Denies dyspnea, cough, sputum, wheezing, coughing up blood. GU : Denies urinary burning, blood in urine, urinary frequency, urinary hesitancy, nocturnal urination, and urinary incontinence. MS: Denies joint pain or swelling.  Denies muscle weakness, cramps, atrophy.  Derm: Denies rash, itching, oral ulcerations, hives, unhealing ulcers.  Psych: Denies depression, anxiety, memory loss, suicidal ideation, hallucinations,  and  confusion. Heme: Denies bruising, bleeding, and enlarged lymph nodes. Neuro:  Denies any headaches, dizziness, paresthesias. Endo:   DM,Denies  any problems with, thyroid, adrenal function.  Physical Exam: Vital signs in last 24 hours: Temp:  [98.5 F (36.9 C)-99 F (37.2 C)] 98.8 F (37.1 C) (08/30 1500) Pulse Rate:  [75-93] 80 (08/30 1500) Resp:  [16-18] 18 (08/30 1500) BP: (97-161)/(60-81) 108/62 (08/30 1500) SpO2:  [95 %] 95 % (08/30 1500) Weight:  [109.9 kg (242 lb 3.2 oz)] 109.9 kg (242 lb 3.2 oz) (08/30 0500) Last BM Date: 05/12/17  General:   Alert,  Well-developed, well-nourished, pleasant and cooperative in NAD Head:  Normocephalic and atraumatic. Eyes:  Sclera clear, no icterus.   Conjunctiva pink. Ears:  Normal auditory acuity. Nose:  No deformity, discharge,  or lesions. Mouth:  No deformity or lesions.  Oropharynx pink & moist. Neck:  Supple; no masses or thyromegaly. Lungs:  Clear throughout to auscultation.   No wheezes, crackles, or rhonchi. No acute distress. Heart: midsternal scar, Regular rate and rhythm; no murmurs, clicks, rubs,  or gallops. Extremities:  Without clubbing or edema.right below-knee amputation Neurologic:  Alert and  oriented x4;  grossly normal neurologically. Skin:  Intact without significant lesions or rashes. Psych:  Alert and cooperative. Normal mood and affect. Abdomen:  Soft, nontender and nondistended. No masses, hepatosplenomegaly or hernias noted. Normal bowel sounds, without guarding, and without rebound.         Lab Results:  Recent Labs  05/14/17 1756 05/15/17 0531 05/16/17 0448  WBC 19.5* 18.6* 10.5  HGB 16.5 14.8 14.8  HCT 48.3 44.5 45.1  PLT 238 233 250   BMET  Recent Labs  05/14/17 1630 05/15/17 0531 05/16/17 0448  NA 139 139 141  K 4.8 4.5 3.7  CL 105 104 107  CO2 19* 25 25  GLUCOSE 287* 224* 159*  BUN 35* 35* 28*  CREATININE 1.58* 1.42* 1.07  CALCIUM 9.6 9.3 8.7*   LFT  Recent Labs   05/14/17 0405  PROT 8.4*  ALBUMIN 4.3  AST 27  ALT 19  ALKPHOS 70  BILITOT 1.5*   PT/INR No results for input(s): LABPROT, INR in the last 72 hours.  Studies/Results: Ct Abdomen Pelvis Wo Contrast  Result Date: 05/15/2017 CLINICAL DATA:  Lower abdominal pain for 5 days. nausea and vomiting. Cholelithiasis. EXAM: CT ABDOMEN AND PELVIS WITHOUT CONTRAST TECHNIQUE: Multidetector CT imaging of the abdomen and pelvis was performed following the standard protocol without IV contrast. COMPARISON:  None. FINDINGS: Lower chest: No acute findings. Hepatobiliary: No masses visualized on this unenhanced exam. Tiny gallstones are noted, however there is no evidence of cholecystitis or biliary dilatation. Pancreas: No mass or inflammatory process visualized on this unenhanced exam. Spleen:  Within normal limits in size. Adrenals/Urinary tract: No evidence of urolithiasis or hydronephrosis. Small subcapsular right renal cyst noted. Unremarkable unopacified urinary bladder. Stomach/Bowel: No evidence of obstruction, inflammatory process, or abnormal fluid collections. Vascular/Lymphatic: No pathologically enlarged lymph nodes identified. No evidence of abdominal aortic aneurysm. Aortic atherosclerosis. Reproductive:  No mass or other significant abnormality. Other:  None. Musculoskeletal:  No suspicious bone lesions identified. IMPRESSION: Cholelithiasis. No radiographic evidence of cholecystitis or other acute findings. Aortic atherosclerosis. Electronically Signed   By: Earle Gell M.D.   On: 05/15/2017 11:18    Impression: 1. Intractable nausea and vomiting,Normal WBC count,ild acidosis on admission, elevated blood sugar of 200, lactic acid 2.57 on admission. CT abdomen and pelvisshowed cholelithiasis but no acute abnormality.  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 Continue clear liquid diet, antiemetics Zofran and phenergen. Discussed with patient,that he will benefit from good blood sugar control,intake of food residue and low fiber diet, small frequent meals.  2.This may also be related to medications such as amiodarone.Trulicity is also known to cause nausea vomiting and abdominal pain. Patient on Protonix 40 mg IV every 12 hours along with sucralfate 1 gm twice a day.  LOS: 1 day   Ronnette Juniper, M.D.  05/16/2017, 4:25 PM  Pager [phone] If no answer or after 5 PM call [phone]

## 2017-05-16 NOTE — Significant Event (Signed)
Rapid Response Event Note  Overview:  Called to see patient with new onset CP Time Called: 2355 Arrival Time: 1514 Event Type: Cardiac  Initial Focused Assessment:  On arrival patient supine in bed - alert - w/d - pale - NAD - resps reg and unlabored - bil BS = clear - patient denies SOB, nausea or diaphoresis - states he was looking out the window at the storm and had sudden onset of chest pain to side of left breast area with some radiation to the jaw.  Describes as dull aching pain. Initial pain was 6/10 now at a 5/10.    Abd soft.  12 lead being done.  Manual BP 98/62 left arm - HR 74 - afib - RR 16 O2 sats 100% on 2 liters nasal cannula.     Interventions:  12 lead EKG done. No acute changes noted from previous 12 leads. Order for NTG per Dr. Tana Coast - called back to inform of  Lower BP - order for 500 cc NS bolus.  Labs drawn.  After 125 of NS BP 108/72 - patient states pain is now a 4/10.   After 250 c NS BP 112/70 HR 76 Afib - RR 16 - states pain is now 3.  Will finish fluids and recheck prior to NTG.  NAD - texting on phone.  1606:  Bolus finished.  118/62 HR 78 Pain now at 2/10.   NTG one sublingual given per order.    1616:  BP 92/62 HR 79.  Pain is relieved.  GI MD present examining patient.  Remains w/d.  Follow Up:   1730: Troponin 0.06 - called to MD by Ellard Artis RN.     Plan of Care (if not transferred):  Recheck BP - call RRT as needed.  Handoff to [employer].    Event Summary: Name of Physician Notified: Dr Tana Coast at  (pta RRT)    at    Outcome: Stayed in room and stabalized  Event End Time: [place]  Quin Hoop

## 2017-05-16 NOTE — Progress Notes (Signed)
CRITICAL VALUE ALERT  Critical Value:  Troponin 0.06  Date & Time Notied:  05/16/17 @ 4695  Provider Notified: MD Rai  Orders Received/Actions taken: Pt already had 12-lead EKG and VS taken.

## 2017-05-16 NOTE — Progress Notes (Addendum)
[facility]                                                                              Patient Demographics  Thomas Wright, is a 71 y.o. male, DOB - May 17, 1946, NTI:[id number]  Admit date - 05/14/2017   Admitting Physician Waldemar Dickens, MD  Outpatient Primary MD for the patient is Leighton Ruff, MD  Outpatient specialists:   LOS - 1  days   Medical records reviewed and are as summarized below:    Chief Complaint  Patient presents with  . Nausea  . Emesis       Brief summary   Thomas Wright is a 71 y.o. male with medical history significant of diabetes, systolic and diastolic congestive heart failure, depression, hyperlipidemia, hypertension, PAF, status post aVR. Presenting with 3 day history of nausea vomiting and abdominal discomfort. Initially started out very mild but becoming more severe and constant. Symptoms started a few hours after starting first injection of Trulicity. Patient also reported some loose stools. Patient states he also has stopped his insulin as he was told not to take along the trulicity. Had little oral intake over this period of time due to the fact that every time he eats or drinks, doesn't become more nauseous and had nonbloody nonbilious emesis. Denied chest pain, palpitations, fevers, symptoms, headache, dysuria, frequency, focal neurological deficit, rash.   Assessment & Plan    Principal Problem:   Intractable nausea and vomiting: possibly due to medication reaction from trulicity versus gastroenteritis, rule out any other intra-abdominal pathology - KUB normal with no ileus or obstruction - CT abdomen showed cholelithiasis but no cholecystitis or other acute findings - Lipase normal on admission LFTs normal  - continue IV fluid hydration, antiemetics - placed on IV PPI, GI consulted (c/o belching, heart burn)   Active Problems:  Metabolic acidosis with mild DKA - Anion gap 19, with lactic acidosis at the time of  admission likely due to significant dehydration and poor oral intake - continue IV fluids, sliding scale insulin,clear liquid diet - increase Lantus to 14 units at bedtime, no trulicity at discharge     PAF (paroxysmal atrial fibrillation) (HCC) - currently rate controlled, continue amiodarone, Coreg - resume eliquis once consistently taking PO, currently on Lovenox    Chronic combined systolic and diastolic congestive heart failure (HCC) - currently stable, last echo with EF of 20% and grade 3 diastolic dysfunction - Monitor for any fluid overload    CKD (chronic kidney disease), stage III - Continue gentle hydration, creatinine improving, baseline 1.3 - creatinine improved 1.0  Addendum Chest pain - Currently resolved, troponin times one 0.06, EKG did not show acute ST-T wave changes, patient has left bundle branch block compared to previous EKGs - will trend troponins, obtain 2-D echo, consult cardiology   Code Status: full   DVT Prophylaxis:  Lovenox Family Communication: Discussed in detail with the patient, all imaging results, lab results explained to the patient   Disposition Plan:  Time Spent in minutes   25 minutes  Procedures:  KUB   Consultants:   GI   Antimicrobials:  Medications  Scheduled Meds: . amiodarone  100 mg Oral Daily  . enoxaparin (LOVENOX) injection  110 mg Subcutaneous Q12H  . furosemide  40 mg Oral Daily  . insulin aspart  0-5 Units Subcutaneous QHS  . insulin aspart  0-9 Units Subcutaneous TID WC  . insulin glargine  14 Units Subcutaneous QHS  . metoprolol tartrate  5 mg Intravenous Q6H  . pantoprazole (PROTONIX) IV  40 mg Intravenous Q12H  . senna-docusate  2 tablet Oral QHS  . sucralfate  1 g Oral BID   Continuous Infusions: . sodium chloride 1,000 mL (05/16/17 1003)  . famotidine (PEPCID) IV Stopped (05/16/17 0909)   PRN Meds:.acetaminophen **OR** acetaminophen, hydrALAZINE, [DISCONTINUED] ondansetron **OR** ondansetron  (ZOFRAN) IV, promethazine   Antibiotics   Anti-infectives    None        Subjective:   Thomas Wright was seen and examined today. Still feeling nauseous, abdominal pain, belching, heartburn. Epigastric abdominal pain, intermittent, 3/10  Patient denies dizziness, chest pain, shortness of breath,  new weakness, numbess, tingling. No fevers  Objective:   Vitals:   05/15/17 2230 05/16/17 0053 05/16/17 0500 05/16/17 0544  BP: 119/81 97/60  129/66  Pulse: 86 75  93  Resp: 16   16  Temp: 98.5 F (36.9 C)   99 F (37.2 C)  TempSrc: Oral   Oral  SpO2: 95%   95%  Weight:   109.9 kg (242 lb 3.2 oz)   Height:        Intake/Output Summary (Last 24 hours) at 05/16/17 1135 Last data filed at 05/16/17 0900  Gross per 24 hour  Intake             2050 ml  Output             1000 ml  Net             1050 ml     Wt Readings from Last 3 Encounters:  05/16/17 109.9 kg (242 lb 3.2 oz)  04/23/17 117.9 kg (260 lb)  04/17/17 117.9 kg (260 lb)     Exam General: Alert and oriented x 3, NAD Eyes:  HEENT:   Cardiovascular: S1 S2 auscultated, no rubs, murmurs or gallops. Regular rate and rhythm. Respiratory: Clear to auscultation bilaterally, no wheezing, rales or rhonchi Gastrointestinal: Soft, mild TTP in epigastric region, nondistended, + bowel sounds Ext: right BKA Neuro: no new deficits Musculoskeletal: No digital cyanosis, clubbing Skin: No rashes Psych: Normal affect and demeanor, alert and oriented x3    Data Reviewed:  I have personally reviewed following labs and imaging studies  Micro Results No results found for this or any previous visit (from the past 240 hour(s)).  Radiology Reports Ct Abdomen Pelvis Wo Contrast  Result Date: 05/15/2017 CLINICAL DATA:  Lower abdominal pain for 5 days. nausea and vomiting. Cholelithiasis. EXAM: CT ABDOMEN AND PELVIS WITHOUT CONTRAST TECHNIQUE: Multidetector CT imaging of the abdomen and pelvis was performed following the standard  protocol without IV contrast. COMPARISON:  None. FINDINGS: Lower chest: No acute findings. Hepatobiliary: No masses visualized on this unenhanced exam. Tiny gallstones are noted, however there is no evidence of cholecystitis or biliary dilatation. Pancreas: No mass or inflammatory process visualized on this unenhanced exam. Spleen:  Within normal limits in size. Adrenals/Urinary tract: No evidence of urolithiasis or hydronephrosis. Small subcapsular right renal cyst noted. Unremarkable unopacified urinary bladder. Stomach/Bowel: No evidence of obstruction, inflammatory process, or abnormal fluid collections. Vascular/Lymphatic: No pathologically enlarged lymph nodes identified. No evidence  of abdominal aortic aneurysm. Aortic atherosclerosis. Reproductive:  No mass or other significant abnormality. Other:  None. Musculoskeletal:  No suspicious bone lesions identified. IMPRESSION: Cholelithiasis. No radiographic evidence of cholecystitis or other acute findings. Aortic atherosclerosis. Electronically Signed   By: Earle Gell M.D.   On: 05/15/2017 11:18   Dg Chest 2 View  Result Date: 05/14/2017 CLINICAL DATA:  Left chest pain, history of hypertension and diabetes, followup of small anterior mediastinal lesion EXAM: CHEST  2 VIEW COMPARISON:  CT chest of 02/26/2017 and chest x-ray of 01/30/2016 FINDINGS: The lungs are not well aerated with persistent chronic elevation of the right hemidiaphragm and some resulting right basilar volume loss. Mediastinal and hilar contours are unremarkable with cardiomegaly remaining stable. Aortic valve replacement is present. Median sternotomy sutures are noted. No acute bony abnormality is seen. IMPRESSION: Poor inspiration with mild volume loss at the right lung base and elevation of the right hemidiaphragm. No definite active process. Electronically Signed   By: Ivar Drape M.D.   On: 05/14/2017 09:06   Dg Abd 1 View  Result Date: 05/14/2017 CLINICAL DATA:  Left-sided  abdominal pain for several days, initial encounter EXAM: ABDOMEN - 1 VIEW COMPARISON:  None. FINDINGS: Degenerative changes of lumbar spine are seen. Scattered large and small bowel gas is noted. Calcification of the vas deferens is noted bilaterally. No abnormal mass or abnormal calcifications are seen. IMPRESSION: No acute abnormality noted. Electronically Signed   By: Inez Catalina M.D.   On: 05/14/2017 08:32   Nm Pet Image Initial (pi) Skull Base To Thigh  Result Date: 05/07/2017 CLINICAL DATA:  Initial treatment strategy for mediastinal mass. EXAM: NUCLEAR MEDICINE PET SKULL BASE TO THIGH TECHNIQUE: 12.4 mCi F-18 FDG was injected intravenously. Full-ring PET imaging was performed from the skull base to thigh after the radiotracer. CT data was obtained and used for attenuation correction and anatomic localization. FASTING BLOOD GLUCOSE:  Value: 138 mg/dl COMPARISON:  Chest CT dated 02/26/2017 FINDINGS: NECK No hypermetabolic lymph nodes in the neck. Carotid atherosclerotic calcification. Several small hypodense lesions in the thyroid gland are observed without hypermetabolic activity. CHEST The low-density anterior mediastinal lesion of concern measures 1.8 by 1.4 cm on image 73/2 and has a maximum standard uptake value of 0.93. This is well below the background mediastinal blood pool activity of 3.7. No hypermetabolic adenopathy in the chest. Coronary, aortic arch, and branch vessel atherosclerotic vascular disease. Bibasilar atelectasis versus scarring. Aortic valve prosthesis. Mild-to-moderate cardiomegaly. Prior median sternotomy. ABDOMEN/PELVIS No abnormal hypermetabolic activity within the liver, pancreas, adrenal glands, or spleen. No hypermetabolic lymph nodes in the abdomen or pelvis. Aortoiliac atherosclerotic vascular disease. Small gallstones are present dependently in the gallbladder. Exophytic hypodense lesion extending posteriorly from the right kidney lower pole appears photopenic. Calcified  vas deferens. Bilateral scrotal hydroceles, right greater than left. SKELETON No focal hypermetabolic activity to suggest skeletal metastasis. Thoracolumbar spondylosis and degenerative disc disease. IMPRESSION: 1. The anterior mediastinal lesion appears relatively photopenic, and has mildly increased in size over the last several years. Possibilities include postoperative fluid collection, thymic cyst, or less likely cystic thymoma. The photopenic appearance with activity much less than the mediastinal blood pool is certainly reassuring against aggressive malignancy. I would suggest surveillance noncontrast CT of the chest in 1 years time for monitoring purposes. 2.  Aortic Atherosclerosis (ICD10-I70.0).  Coronary atherosclerosis. 3. Bibasilar atelectasis versus scarring. 4. Small bilateral scrotal hydroceles. 5. Cholelithiasis. Electronically Signed   By: Van Clines M.D.   On: 05/07/2017 09:13  Lab Data:  CBC:  Recent Labs Lab 05/14/17 0405 05/14/17 1756 05/15/17 0531 05/16/17 0448  WBC 17.5* 19.5* 18.6* 10.5  NEUTROABS 14.6*  --   --   --   HGB 16.6 16.5 14.8 14.8  HCT 49.7 48.3 44.5 45.1  MCV 83.8 84.1 85.2 85.4  PLT 271 238 233 110   Basic Metabolic Panel:  Recent Labs Lab 05/14/17 0405 05/14/17 1630 05/15/17 0531 05/16/17 0448  NA 139 139 139 141  K 4.8 4.8 4.5 3.7  CL 100* 105 104 107  CO2 20* 19* 25 25  GLUCOSE 218* 287* 224* 159*  BUN 23* 35* 35* 28*  CREATININE 1.39* 1.58* 1.42* 1.07  CALCIUM 10.4* 9.6 9.3 8.7*   GFR: Estimated Creatinine Clearance: 84.7 mL/min (by C-G formula based on SCr of 1.07 mg/dL). Liver Function Tests:  Recent Labs Lab 05/14/17 0405  AST 27  ALT 19  ALKPHOS 70  BILITOT 1.5*  PROT 8.4*  ALBUMIN 4.3    Recent Labs Lab 05/14/17 0405  LIPASE 23   No results for input(s): AMMONIA in the last 168 hours. Coagulation Profile: No results for input(s): INR, PROTIME in the last 168 hours. Cardiac Enzymes: No results for  input(s): CKTOTAL, CKMB, CKMBINDEX, TROPONINI in the last 168 hours. BNP (last 3 results) No results for input(s): PROBNP in the last 8760 hours. HbA1C:  Recent Labs  05/15/17 1034  HGBA1C 8.4*   CBG:  Recent Labs Lab 05/15/17 0746 05/15/17 1204 05/15/17 1643 05/15/17 2231 05/16/17 0812  GLUCAP 201* 197* 154* 166* 167*   Lipid Profile: No results for input(s): CHOL, HDL, LDLCALC, TRIG, CHOLHDL, LDLDIRECT in the last 72 hours. Thyroid Function Tests: No results for input(s): TSH, T4TOTAL, FREET4, T3FREE, THYROIDAB in the last 72 hours. Anemia Panel: No results for input(s): VITAMINB12, FOLATE, FERRITIN, TIBC, IRON, RETICCTPCT in the last 72 hours. Urine analysis:    Component Value Date/Time   COLORURINE YELLOW 05/14/2017 [place] 05/14/2017 0737   LABSPEC 1.027 05/14/2017 0737   PHURINE 5.0 05/14/2017 0737   GLUCOSEU >=500 (A) 05/14/2017 0737   HGBUR MODERATE (A) 05/14/2017 0737   BILIRUBINUR NEGATIVE 05/14/2017 0737   KETONESUR 20 (A) 05/14/2017 0737   PROTEINUR 100 (A) 05/14/2017 0737   UROBILINOGEN 0.2 07/28/2014 1739   NITRITE NEGATIVE 05/14/2017 0737   LEUKOCYTESUR NEGATIVE 05/14/2017 0737     Gadge Hermiz M.D. [facility] 05/16/2017, 11:35 AM  Pager: [phone] Between 7am to 7pm - call Pager - [phone]  After 7pm go to [email or web address] - password [email or web address]  Call night coverage person covering after 7pm

## 2017-05-16 NOTE — Progress Notes (Signed)
Pt complains of new chest pain. Pain is aching and 6/10. VS stable. MD made aware and rapid response called.

## 2017-05-16 NOTE — Progress Notes (Addendum)
Inpatient Diabetes Program Recommendations  AACE/ADA: New Consensus Statement on Inpatient Glycemic Control (2015)  Target Ranges:  Prepandial:   less than 140 mg/dL      Peak postprandial:   less than 180 mg/dL (1-2 hours)      Critically ill patients:  140 - 180 mg/dL   Lab Results  Component Value Date   GLUCAP 167 (H) 05/16/2017   HGBA1C 8.4 (H) 05/15/2017    Admit N&V after Starting Trulicity at home.   History: DM, CKD, CHF  Home Meds: Toujeo 28 units QHS + Trulicity 1.5 mg Qweek + Jardiance 10 mg daily + Metformin ER 500 mg BID + Regular Insulin 6 units Breakfast/ 6 units Lunch/ 8 units Dinner (stopped on Saturday when he started Trulicity per Dr. Darryl Nestle orders)  Current orders: Lantus 10 units QHS                           Novolog Sensitive Correction Scale/ SSI (0-9 units) TID AC + HS  Inpatient Diabetes Program Recommendations:    Note diabetes coordinator saw patient on 05/15/17.  Note that Trulicity will not be restarted at d/c.  May also consider holding Jardiance at d/c until patient follows up with Dr. Elyse Hsu?  Will follow.  Thanks, Adah Perl, RN, BC-ADM Inpatient Diabetes Coordinator Pager [phone] (8a-5p)

## 2017-05-17 ENCOUNTER — Encounter (HOSPITAL_COMMUNITY)
Admission: EM | Disposition: A | Discharge status: Home Health | Payer: Self-pay | Source: Home / Self Care | Attending: Internal Medicine

## 2017-05-17 ENCOUNTER — Inpatient Hospital Stay (HOSPITAL_COMMUNITY): Payer: Medicare HMO

## 2017-05-17 DIAGNOSIS — I5042 Chronic combined systolic (congestive) and diastolic (congestive) heart failure: Secondary | ICD-10-CM

## 2017-05-17 DIAGNOSIS — I480 Paroxysmal atrial fibrillation: Secondary | ICD-10-CM

## 2017-05-17 DIAGNOSIS — I25110 Atherosclerotic heart disease of native coronary artery with unstable angina pectoris: Secondary | ICD-10-CM

## 2017-05-17 DIAGNOSIS — R079 Chest pain, unspecified: Secondary | ICD-10-CM

## 2017-05-17 DIAGNOSIS — Z952 Presence of prosthetic heart valve: Secondary | ICD-10-CM

## 2017-05-17 DIAGNOSIS — I739 Peripheral vascular disease, unspecified: Secondary | ICD-10-CM

## 2017-05-17 DIAGNOSIS — I350 Nonrheumatic aortic (valve) stenosis: Secondary | ICD-10-CM

## 2017-05-17 DIAGNOSIS — I6523 Occlusion and stenosis of bilateral carotid arteries: Secondary | ICD-10-CM

## 2017-05-17 HISTORY — PX: LEFT HEART CATH AND CORONARY ANGIOGRAPHY: CATH118249

## 2017-05-17 LAB — CBC
HEMATOCRIT: 44.8 % (ref 39.0–52.0)
Hemoglobin: 14.7 g/dL (ref 13.0–17.0)
MCH: 27.9 pg (ref 26.0–34.0)
MCHC: 32.8 g/dL (ref 30.0–36.0)
MCV: 85 fL (ref 78.0–100.0)
Platelets: 206 10*3/uL (ref 150–400)
RBC: 5.27 MIL/uL (ref 4.22–5.81)
RDW: 13 % (ref 11.5–15.5)
WBC: 9.8 10*3/uL (ref 4.0–10.5)

## 2017-05-17 LAB — BASIC METABOLIC PANEL
Anion gap: 8 (ref 5–15)
BUN: 25 mg/dL — ABNORMAL HIGH (ref 6–20)
CHLORIDE: 109 mmol/L (ref 101–111)
CO2: 23 mmol/L (ref 22–32)
CREATININE: 1.05 mg/dL (ref 0.61–1.24)
Calcium: 8.4 mg/dL — ABNORMAL LOW (ref 8.9–10.3)
GFR calc Af Amer: >60 mL/min (ref >60)
GFR calc non Af Amer: >60 mL/min (ref >60)
GLUCOSE: 128 mg/dL — AB (ref 65–99)
POTASSIUM: 3.3 mmol/L — AB (ref 3.5–5.1)
Sodium: 140 mmol/L (ref 135–145)

## 2017-05-17 LAB — ECHOCARDIOGRAM COMPLETE
Height: 73 in
WEIGHTICAEL: 3924.8 [oz_av]

## 2017-05-17 LAB — GLUCOSE, CAPILLARY
GLUCOSE-CAPILLARY: 117 mg/dL — AB (ref 65–99)
GLUCOSE-CAPILLARY: 134 mg/dL — AB (ref 65–99)

## 2017-05-17 LAB — TROPONIN I
Troponin I: 0.05 ng/mL (ref <0.03)
Troponin I: 0.04 ng/mL (ref <0.03)

## 2017-05-17 SURGERY — LEFT HEART CATH AND CORONARY ANGIOGRAPHY
Anesthesia: LOCAL

## 2017-05-17 MED ORDER — FENTANYL CITRATE (PF) 100 MCG/2ML IJ SOLN
INTRAMUSCULAR | Status: AC
  Filled 2017-05-17: qty 2

## 2017-05-17 MED ORDER — FENTANYL CITRATE (PF) 100 MCG/2ML IJ SOLN
INTRAMUSCULAR | Status: DC | PRN
  Administered 2017-05-17: 25 ug via INTRAVENOUS

## 2017-05-17 MED ORDER — HEPARIN SODIUM (PORCINE) 1000 UNIT/ML IJ SOLN
INTRAMUSCULAR | Status: AC
  Filled 2017-05-17: qty 1

## 2017-05-17 MED ORDER — VERAPAMIL HCL 2.5 MG/ML IV SOLN
INTRAVENOUS | Status: DC | PRN
  Administered 2017-05-17: 10 mL via INTRA_ARTERIAL

## 2017-05-17 MED ORDER — POTASSIUM CHLORIDE CRYS ER 20 MEQ PO TBCR: 40.0000 meq | EXTENDED_RELEASE_TABLET | Freq: Once | ORAL | Status: DC

## 2017-05-17 MED ORDER — HEPARIN (PORCINE) IN NACL 2-0.9 UNIT/ML-% IJ SOLN
INTRAMUSCULAR | Status: AC
  Filled 2017-05-17: qty 1000

## 2017-05-17 MED ORDER — MIDAZOLAM HCL 2 MG/2ML IJ SOLN
INTRAMUSCULAR | Status: AC
  Filled 2017-05-17: qty 2

## 2017-05-17 MED ORDER — POTASSIUM CHLORIDE 10 MEQ/100ML IV SOLN
10.0000 meq | INTRAVENOUS | Status: AC
  Administered 2017-05-17 (×2): 10 meq via INTRAVENOUS
  Filled 2017-05-17: qty 100

## 2017-05-17 MED ORDER — MIDAZOLAM HCL 2 MG/2ML IJ SOLN
INTRAMUSCULAR | Status: DC | PRN
  Administered 2017-05-17: 2 mg via INTRAVENOUS

## 2017-05-17 MED ORDER — HEPARIN (PORCINE) IN NACL 2-0.9 UNIT/ML-% IJ SOLN
INTRAMUSCULAR | Status: AC | PRN
  Administered 2017-05-17: 1000 mL

## 2017-05-17 MED ORDER — ENOXAPARIN SODIUM 120 MG/0.8ML ~~LOC~~ SOLN
110.0000 mg | Freq: Two times a day (BID) | SUBCUTANEOUS | Status: DC
  Administered 2017-05-18: 110 mg via SUBCUTANEOUS
  Filled 2017-05-17 (×2): qty 0.8

## 2017-05-17 MED ORDER — IOPAMIDOL (ISOVUE-370) INJECTION 76%
INTRAVENOUS | Status: DC | PRN
  Administered 2017-05-17: 60 mL via INTRA_ARTERIAL

## 2017-05-17 MED ORDER — HEPARIN SODIUM (PORCINE) 1000 UNIT/ML IJ SOLN
INTRAMUSCULAR | Status: DC | PRN
  Administered 2017-05-17: 5000 [IU] via INTRAVENOUS

## 2017-05-17 MED ORDER — SODIUM CHLORIDE 0.9% FLUSH: 3.0000 mL | INTRAVENOUS | Status: DC | PRN

## 2017-05-17 MED ORDER — ACETAMINOPHEN 325 MG PO TABS
650.0000 mg | ORAL_TABLET | Freq: Once | ORAL | Status: AC
  Administered 2017-05-17: 650 mg via ORAL
  Filled 2017-05-17: qty 2

## 2017-05-17 MED ORDER — VERAPAMIL HCL 2.5 MG/ML IV SOLN
INTRAVENOUS | Status: AC
  Filled 2017-05-17: qty 2

## 2017-05-17 MED ORDER — SODIUM CHLORIDE 0.9 % WEIGHT BASED INFUSION: 1.0000 mL/kg/h | INTRAVENOUS | Status: AC

## 2017-05-17 MED ORDER — PERFLUTREN LIPID MICROSPHERE
1.0000 mL | INTRAVENOUS | Status: DC | PRN
  Administered 2017-05-17: 2 mL via INTRAVENOUS
  Filled 2017-05-17: qty 10

## 2017-05-17 MED ORDER — SODIUM CHLORIDE 0.9 % IV SOLN
250.0000 mL | INTRAVENOUS | Status: DC | PRN
Start: 2017-05-18 — End: 2017-05-18

## 2017-05-17 MED ORDER — LIDOCAINE HCL (PF) 1 % IJ SOLN
INTRAMUSCULAR | Status: AC
  Filled 2017-05-17: qty 30

## 2017-05-17 MED ORDER — LIDOCAINE HCL (PF) 1 % IJ SOLN
INTRAMUSCULAR | Status: DC | PRN
  Administered 2017-05-17: 2 mL

## 2017-05-17 MED ORDER — IOPAMIDOL (ISOVUE-370) INJECTION 76%
INTRAVENOUS | Status: AC
  Filled 2017-05-17: qty 100

## 2017-05-17 MED ORDER — SODIUM CHLORIDE 0.9% FLUSH
3.0000 mL | Freq: Two times a day (BID) | INTRAVENOUS | Status: DC
  Administered 2017-05-18 (×2): 3 mL via INTRAVENOUS

## 2017-05-17 SURGICAL SUPPLY — 10 items
CATH INFINITI 5 FR JL3.5 (CATHETERS) ×2 IMPLANT
CATH INFINITI JR4 5F (CATHETERS) ×2 IMPLANT
DEVICE RAD COMP TR BAND LRG (VASCULAR PRODUCTS) ×2 IMPLANT
GLIDESHEATH SLEND SS 6F .021 (SHEATH) ×2 IMPLANT
GUIDEWIRE INQWIRE 1.5J.035X260 (WIRE) ×1 IMPLANT
INQWIRE 1.5J .035X260CM (WIRE) ×2
KIT HEART LEFT (KITS) ×2 IMPLANT
PACK CARDIAC CATHETERIZATION (CUSTOM PROCEDURE TRAY) ×2 IMPLANT
TRANSDUCER W/STOPCOCK (MISCELLANEOUS) ×2 IMPLANT
TUBING CIL FLEX 10 FLL-RA (TUBING) ×2 IMPLANT

## 2017-05-17 NOTE — Progress Notes (Deleted)
PT lost IV access. IV team attempted x2. Pt refused any further attempts. Unable to give AM abx.

## 2017-05-17 NOTE — Progress Notes (Signed)
[place] for lovenox > resume 8 hrs after sheath pull Indication: atrial fibrillation  Allergies  Allergen Reactions  . Amoxicill-Clarithro-Omeprazole Diarrhea and Nausea And Vomiting    Patient Measurements: Height: 6\' 1"  (185.4 cm) Weight: 245 lb 4.8 oz (111.3 kg) IBW/kg (Calculated) : 79.9  Vital Signs: Temp: 98 F (36.7 C) (08/31 1520) Temp Source: Oral (08/31 1520) BP: 144/76 (08/31 1745) Pulse Rate: 88 (08/31 1745)  Labs:  Recent Labs  05/15/17 0531 05/16/17 0448  05/16/17 1838 05/16/17 2308 05/17/17 0529  HGB 14.8 14.8  --   --   --  14.7  HCT 44.5 45.1  --   --   --  44.8  PLT 233 250  --   --   --  206  CREATININE 1.42* 1.07  --   --   --  1.05  TROPONINI  --   --   < > 0.05* 0.05* 0.04*  < > = values in this interval not displayed.  Estimated Creatinine Clearance: 86.9 mL/min (by C-G formula based on SCr of 1.05 mg/dL).  Assessment: 52 yom presented to the ED with N/V. He is on chronic apixaban for history of afib. However, pt has been unable to consistently tolerate PO and is receiving Lovenox while apixaban on hold. CBC stable.   PM f/u > he is now s/p cath lab today, no significant CAD, pharmacy asked to resume lovenox 8 hrs after sheaths pulled.  Sheaths out 1743 PM.  Goal of Therapy:  Anti-Xa level 0.6-1 units/ml 4hrs after LMWH dose given Monitor platelets by anticoagulation protocol: Yes   Plan:  1. Restart Lovenox 110mg  SQ Q12 hours  - next dose due 0200 AM.  Can try to adjust some going forward to make at more convenient times. 2. F/u CBC, S&S of bleeding 3. F/u ability to restart of apixaban when appropriate  Uvaldo Rising, BCPS  Clinical Pharmacist Pager [phone]  05/17/2017 5:54 PM

## 2017-05-17 NOTE — Interval H&P Note (Signed)
History and Physical Interval Note:  05/17/2017 5:15 PM  Thomas Wright  has presented today for surgery, with the diagnosis of cp  The various methods of treatment have been discussed with the patient and family. After consideration of risks, benefits and other options for treatment, the patient has consented to  Procedure(s): LEFT HEART CATH AND CORONARY ANGIOGRAPHY (N/A) as a surgical intervention .  The patient's history has been reviewed, patient examined, no change in status, stable for surgery.  I have reviewed the patient's chart and labs.  Questions were answered to the patient's satisfaction.   Cath Lab Visit (complete for each Cath Lab visit)  Clinical Evaluation Leading to the Procedure:   ACS: Yes.    Non-ACS:    Anginal Classification: CCS III  Anti-ischemic medical therapy: Maximal Therapy (2 or more classes of medications)  Non-Invasive Test Results: No non-invasive testing performed  Prior CABG: No previous CABG        Collier Salina [facility] 05/17/2017 5:15 PM
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
## 2017-05-17 NOTE — Progress Notes (Signed)
Physical Therapy Treatment Patient Details Name: Thomas Wright MRN: [id number] DOB: 24-Jul-1946 Today's Date: 05/17/2017    History of Present Illness Pt is a 71 y/o male admitted secondary to intractable nausea/vomiting. CT performed and showed cholelithiasis. CT of the chest showed mild volume loss at R base. PMH includes HTN, DM, obesity, CKD, a fib, CHF, cardiomyopathy, s/p R BKA, and R finger amputation, and s/p cardiac valve replacement.     PT Comments    Pt able to progress gait despite little nutrition and little activity over the last weak.  Pt with notable fatigue and need to rest before return to room, due to attempting to ambulate further that he could manage   Follow Up Recommendations  Home health PT;Supervision for mobility/OOB     Equipment Recommendations  None recommended by PT    Recommendations for Other Services       Precautions / Restrictions Precautions Precautions: Fall Precaution Comments: R BKA; has prosthesis  Restrictions Weight Bearing Restrictions: No    Mobility  Bed Mobility Overal bed mobility: Modified Independent Bed Mobility: Sit to Supine       Sit to supine: Modified independent (Device/Increase time)   General bed mobility comments: slower, but capable of all bed mobility without assist  Transfers Overall transfer level: Needs assistance Equipment used: Rolling walker (2 wheeled) Transfers: Sit to/from Stand Sit to Stand: Min guard         General transfer comment: using grab bars and 3 in 1 over toilet  Ambulation/Gait Ambulation/Gait assistance: Min guard Ambulation Distance (Feet): 330 Feet Assistive device: Rolling walker (2 wheeled) Gait Pattern/deviations: Step-through pattern Gait velocity: Decreased Gait velocity interpretation: Below normal speed for age/gender General Gait Details: Fatigued fully at 330 feet and unable to continue.  Will fatigue came shuffle and mild instability  EHR climbed with mild  dyspnea   Stairs            Wheelchair Mobility    Modified Rankin (Stroke Patients Only)       Balance Overall balance assessment: Needs assistance Sitting-balance support: No upper extremity supported;Feet supported Sitting balance-Leahy Scale: Good Sitting balance - Comments: Able to doff R prosthesis in sitting    Standing balance support: No upper extremity supported Standing balance-Leahy Scale: Fair Standing balance comment: able to stand upright at EOB without AD                            Cognition Arousal/Alertness: Awake/alert Behavior During Therapy: WFL for tasks assessed/performed Overall Cognitive Status: Within Functional Limits for tasks assessed                                        Exercises      General Comments        Pertinent Vitals/Pain Pain Assessment: Faces Pain Score: 2  Faces Pain Scale: Hurts a little bit Pain Location: stomach Pain Descriptors / Indicators: Discomfort Pain Intervention(s): Monitored during session    Home Living Family/patient expects to be discharged to:: Private residence Living Arrangements: Spouse/significant other Available Help at Discharge: Family;Available 24 hours/day Type of Home: House Home Access: Ramped entrance   Home Layout: One level Home Equipment: Walker - 2 wheels;Cane - single point;Bedside commode;Tub bench;Wheelchair - manual      Prior Function Level of Independence: Independent with assistive device(s)  Comments: Reports he used RW for longer distances and cane for shorter distances. Uses R prosthesis at baseline.    PT Goals (current goals can now be found in the care plan section) Acute Rehab PT Goals Patient Stated Goal: to feel better  PT Goal Formulation: With patient Time For Goal Achievement: 05/23/17 Potential to Achieve Goals: Good Progress towards PT goals: Progressing toward goals    Frequency    Min 3X/week      PT Plan  Current plan remains appropriate    Co-evaluation              AM-PAC PT "6 Clicks" Daily Activity  Outcome Measure  Difficulty turning over in bed (including adjusting bedclothes, sheets and blankets)?: A Little Difficulty moving from lying on back to sitting on the side of the bed? : A Little Difficulty sitting down on and standing up from a chair with arms (e.g., wheelchair, bedside commode, etc,.)?: A Little Help needed moving to and from a bed to chair (including a wheelchair)?: A Little Help needed walking in hospital room?: A Little Help needed climbing 3-5 steps with a railing? : A Lot 6 Click Score: 17    End of Session   Activity Tolerance: Patient tolerated treatment well;Patient limited by fatigue Patient left: Other (comment) (up completing walk with OT)   PT Visit Diagnosis: Other abnormalities of gait and mobility (R26.89);Difficulty in walking, not elsewhere classified (R26.2)     Time: 7915-0569 PT Time Calculation (min) (ACUTE ONLY): 28 min  Charges:  $Gait Training: 8-22 mins $Therapeutic Activity: 8-22 mins                    G Codes:       May 22, 2017  Donnella Sham, PT [phone]  (pager)   Thomas Wright 05/22/2017, 1:35 PM

## 2017-05-17 NOTE — Progress Notes (Signed)
Inpatient Diabetes Program Recommendations  AACE/ADA: New Consensus Statement on Inpatient Glycemic Control (2015)  Target Ranges:  Prepandial:   less than 140 mg/dL      Peak postprandial:   less than 180 mg/dL (1-2 hours)      Critically ill patients:  140 - 180 mg/dL   Lab Results  Component Value Date   GLUCAP 117 (H) 05/17/2017   HGBA1C 8.4 (H) 05/15/2017    Review of Glycemic ControlResults for Thomas Wright, Thomas Wright (MRN [id number]) as of 05/17/2017 11:32  Ref. Range 05/16/2017 08:12 05/16/2017 11:58 05/16/2017 16:49 05/16/2017 21:35 05/17/2017 07:52  Glucose-Capillary Latest Ref Range: 65 - 99 mg/dL 167 (H) 167 (H) 179 (H) 158 (H) 117 (H)  Admit N&V after Starting Trulicity at home.   History: DM, CKD, CHF  Home Meds: Toujeo 28 units QHS + Trulicity 1.5 mg Qweek + Jardiance 10 mg daily+ Metformin ER 500 mg BID + Regular Insulin6 unitsBreakfast/ 6 unitsLunch/ 8 units Dinner (stopped on [employer] he started Trulicity per Dr. Darryl Nestle orders)  Current orders: Lantus 10 units QHS Novolog Sensitive Correction Scale/ SSI (0-9 units) TID AC + HS  Inpatient Diabetes Program Recommendations:   Note that Trulicity will not be restarted at d/c. Please also consider d/c of Jardiance from home medications due to increased risk for dehydration and euglycemic DKA. Patient will need to follow-up with endocrinologist regarding diabetes management.   Thanks, Adah Perl, RN, BC-ADM Inpatient Diabetes Coordinator Pager [phone] (8a-5p)

## 2017-05-17 NOTE — Progress Notes (Signed)
Received patient from Cath lab post Cath, patient alert and oriented not in distress.. S/p post Right radial cath site clean dry and intact , no bleeding or hematoma noted.TR band with 14cc of air per report. Will monitor accordingly.

## 2017-05-17 NOTE — Progress Notes (Signed)
[facility]                                                                              Patient Demographics  Thomas Wright, is a 71 y.o. male, DOB - [birth year], KKX:[id number]  Admit date - 05/14/2017   Admitting Physician Waldemar Dickens, MD  Outpatient Primary MD for the patient is Leighton Ruff, MD  Outpatient specialists:   LOS - 2  days   Medical records reviewed and are as summarized below:    Chief Complaint  Patient presents with  . Nausea  . Emesis       Brief summary   Thomas Wright is a 71 y.o. male with medical history significant of diabetes, systolic and diastolic congestive heart failure, depression, hyperlipidemia, hypertension, PAF, status post aVR. Presenting with 3 day history of nausea vomiting and abdominal discomfort. Initially started out very mild but becoming more severe and constant. Symptoms started a few hours after starting first injection of Trulicity. Patient also reported some loose stools. Patient states he also has stopped his insulin as he was told not to take along the trulicity. Had little oral intake over this period of time due to the fact that every time he eats or drinks, doesn't become more nauseous and had nonbloody nonbilious emesis. Denied chest pain, palpitations, fevers, symptoms, headache, dysuria, frequency, focal neurological deficit, rash.   Assessment & Plan    Principal Problem:   Intractable nausea and vomiting: possibly due to medication reaction from trulicity versus gastroenteritis, rule out any other intra-abdominal pathology - KUB normal with no ileus or obstruction - CT abdomen showed cholelithiasis but no cholecystitis or other acute findings - Lipase normal on admission LFTs normal  - continue IV fluid hydration, antiemetics - placed on IV PPI, GI consulted (c/o belching, heart burn), recommended GE empty study to rule out gastroparesis  Active Problems:  Metabolic acidosis with mild DKA -  Anion gap 19, with lactic acidosis at the time of admission likely due to significant dehydration and poor oral intake - Continue Lantus 14 units at bedtime, no trulicity at discharge  - NPO for GE study    PAF (paroxysmal atrial fibrillation) (HCC) - currently rate controlled, continue amiodarone, Coreg - resume eliquis once consistently taking PO, currently on Lovenox    Chronic combined systolic and diastolic congestive heart failure (HCC) - currently stable, last echo with EF of 20% and grade 3 diastolic dysfunction - Monitor for any fluid overload    CKD (chronic kidney disease), stage III - Continue gentle hydration, creatinine improving, baseline 1.3 - creatinine improved 1.0  Chest pain -troponins positive, EKG did not show acute ST-T wave changes, patient has left bundle branch block compared to previous EKGs - 2D echo pending, cardiology consulted  Code Status: full   DVT Prophylaxis:  Lovenox Family Communication: Discussed in detail with the patient, all imaging results, lab results explained to the patient   Disposition Plan:  Time Spent in minutes   25 minutes  Procedures:  KUB  GE study    Consultants:   GI  cardiology  Antimicrobials:      Medications  Scheduled Meds: . amiodarone  100 mg Oral Daily  . enoxaparin (LOVENOX) injection  110 mg Subcutaneous Q12H  . furosemide  40 mg Oral Daily  . insulin aspart  0-5 Units Subcutaneous QHS  . insulin aspart  0-9 Units Subcutaneous TID WC  . insulin glargine  14 Units Subcutaneous QHS  . pantoprazole (PROTONIX) IV  40 mg Intravenous Q12H  . potassium chloride  40 mEq Oral Once  . senna-docusate  2 tablet Oral QHS  . sucralfate  1 g Oral BID   Continuous Infusions: . sodium chloride 1,000 mL (05/16/17 1812)   PRN Meds:.acetaminophen **OR** acetaminophen, hydrALAZINE, nitroGLYCERIN, [DISCONTINUED] ondansetron **OR** ondansetron (ZOFRAN) IV, promethazine   Antibiotics   Anti-infectives    None          Subjective:   Thomas Wright was seen and examined today. Feeling better this morning, CP improved, still feeling somewhat nauseous and abdominal pain is improving. Patient denies dizziness, chest pain, shortness of breath,  new weakness, numbess, tingling. No fevers  Objective:   Vitals:   05/16/17 1616 05/16/17 1740 05/16/17 2137 05/17/17 0540  BP: (!) 92/42 109/62 133/85 139/66  Pulse:   82 85  Resp: 18  18 18   Temp:   98.9 F (37.2 C) 98.2 F (36.8 C)  TempSrc:   Oral Oral  SpO2:   100% 99%  Weight:    111.3 kg (245 lb 4.8 oz)  Height:        Intake/Output Summary (Last 24 hours) at 05/17/17 1302 Last data filed at 05/17/17 1000  Gross per 24 hour  Intake           924.25 ml  Output             1300 ml  Net          -375.75 ml     Wt Readings from Last 3 Encounters:  05/17/17 111.3 kg (245 lb 4.8 oz)  04/23/17 117.9 kg (260 lb)  04/17/17 117.9 kg (260 lb)     Exam General: Alert and oriented x 3, NAD Eyes:  HEENT:   Cardiovascular: S1 S2 auscultated, no rubs, murmurs or gallops. Regular rate and rhythm. No pedal edema b/l Respiratory: Clear to auscultation bilaterally, no wheezing, rales or rhonchi Gastrointestinal: Soft, nontender, nondistended, + bowel sounds Ext: no pedal edema bilaterally Neuro: no new deficits Musculoskeletal: No digital cyanosis, clubbing Skin: No rashes Psych: Normal affect and demeanor, alert and oriented x3     Data Reviewed:  I have personally reviewed following labs and imaging studies  Micro Results Recent Results (from the past 240 hour(s))  Culture, blood (routine x 2)     Status: None (Preliminary result)   Collection Time: 05/15/17 10:37 AM  Result Value Ref Range Status   Specimen Description BLOOD LEFT ANTECUBITAL  Final   Special Requests IN PEDIATRIC BOTTLE Blood Culture adequate volume  Final   Culture NO GROWTH 1 DAY  Final   Report Status PENDING  Incomplete  Culture, blood (routine x 2)     Status:  None (Preliminary result)   Collection Time: 05/15/17 10:44 AM  Result Value Ref Range Status   Specimen Description BLOOD LEFT HAND  Final   Special Requests IN PEDIATRIC BOTTLE Blood Culture adequate volume  Final   Culture NO GROWTH 1 DAY  Final   Report Status PENDING  Incomplete    Radiology Reports Ct Abdomen Pelvis Wo Contrast  Result Date: 05/15/2017 CLINICAL DATA:  Lower abdominal pain for  5 days. nausea and vomiting. Cholelithiasis. EXAM: CT ABDOMEN AND PELVIS WITHOUT CONTRAST TECHNIQUE: Multidetector CT imaging of the abdomen and pelvis was performed following the standard protocol without IV contrast. COMPARISON:  None. FINDINGS: Lower chest: No acute findings. Hepatobiliary: No masses visualized on this unenhanced exam. Tiny gallstones are noted, however there is no evidence of cholecystitis or biliary dilatation. Pancreas: No mass or inflammatory process visualized on this unenhanced exam. Spleen:  Within normal limits in size. Adrenals/Urinary tract: No evidence of urolithiasis or hydronephrosis. Small subcapsular right renal cyst noted. Unremarkable unopacified urinary bladder. Stomach/Bowel: No evidence of obstruction, inflammatory process, or abnormal fluid collections. Vascular/Lymphatic: No pathologically enlarged lymph nodes identified. No evidence of abdominal aortic aneurysm. Aortic atherosclerosis. Reproductive:  No mass or other significant abnormality. Other:  None. Musculoskeletal:  No suspicious bone lesions identified. IMPRESSION: Cholelithiasis. No radiographic evidence of cholecystitis or other acute findings. Aortic atherosclerosis. Electronically Signed   By: Earle Gell M.D.   On: 05/15/2017 11:18   Dg Chest 2 View  Result Date: 05/14/2017 CLINICAL DATA:  Left chest pain, history of hypertension and diabetes, followup of small anterior mediastinal lesion EXAM: CHEST  2 VIEW COMPARISON:  CT chest of 02/26/2017 and chest x-ray of 01/30/2016 FINDINGS: The lungs are not  well aerated with persistent chronic elevation of the right hemidiaphragm and some resulting right basilar volume loss. Mediastinal and hilar contours are unremarkable with cardiomegaly remaining stable. Aortic valve replacement is present. Median sternotomy sutures are noted. No acute bony abnormality is seen. IMPRESSION: Poor inspiration with mild volume loss at the right lung base and elevation of the right hemidiaphragm. No definite active process. Electronically Signed   By: Ivar Drape M.D.   On: 05/14/2017 09:06   Dg Abd 1 View  Result Date: 05/14/2017 CLINICAL DATA:  Left-sided abdominal pain for several days, initial encounter EXAM: ABDOMEN - 1 VIEW COMPARISON:  None. FINDINGS: Degenerative changes of lumbar spine are seen. Scattered large and small bowel gas is noted. Calcification of the vas deferens is noted bilaterally. No abnormal mass or abnormal calcifications are seen. IMPRESSION: No acute abnormality noted. Electronically Signed   By: Inez Catalina M.D.   On: 05/14/2017 08:32   Nm Pet Image Initial (pi) Skull Base To Thigh  Result Date: 05/07/2017 CLINICAL DATA:  Initial treatment strategy for mediastinal mass. EXAM: NUCLEAR MEDICINE PET SKULL BASE TO THIGH TECHNIQUE: 12.4 mCi F-18 FDG was injected intravenously. Full-ring PET imaging was performed from the skull base to thigh after the radiotracer. CT data was obtained and used for attenuation correction and anatomic localization. FASTING BLOOD GLUCOSE:  Value: 138 mg/dl COMPARISON:  Chest CT dated 02/26/2017 FINDINGS: NECK No hypermetabolic lymph nodes in the neck. Carotid atherosclerotic calcification. Several small hypodense lesions in the thyroid gland are observed without hypermetabolic activity. CHEST The low-density anterior mediastinal lesion of concern measures 1.8 by 1.4 cm on image 73/2 and has a maximum standard uptake value of 0.93. This is well below the background mediastinal blood pool activity of 3.7. No hypermetabolic  adenopathy in the chest. Coronary, aortic arch, and branch vessel atherosclerotic vascular disease. Bibasilar atelectasis versus scarring. Aortic valve prosthesis. Mild-to-moderate cardiomegaly. Prior median sternotomy. ABDOMEN/PELVIS No abnormal hypermetabolic activity within the liver, pancreas, adrenal glands, or spleen. No hypermetabolic lymph nodes in the abdomen or pelvis. Aortoiliac atherosclerotic vascular disease. Small gallstones are present dependently in the gallbladder. Exophytic hypodense lesion extending posteriorly from the right kidney lower pole appears photopenic. Calcified vas deferens. Bilateral scrotal hydroceles, right  greater than left. SKELETON No focal hypermetabolic activity to suggest skeletal metastasis. Thoracolumbar spondylosis and degenerative disc disease. IMPRESSION: 1. The anterior mediastinal lesion appears relatively photopenic, and has mildly increased in size over the last several years. Possibilities include postoperative fluid collection, thymic cyst, or less likely cystic thymoma. The photopenic appearance with activity much less than the mediastinal blood pool is certainly reassuring against aggressive malignancy. I would suggest surveillance noncontrast CT of the chest in 1 years time for monitoring purposes. 2.  Aortic Atherosclerosis (ICD10-I70.0).  Coronary atherosclerosis. 3. Bibasilar atelectasis versus scarring. 4. Small bilateral scrotal hydroceles. 5. Cholelithiasis. Electronically Signed   By: Van Clines M.D.   On: 05/07/2017 09:13    Lab Data:  CBC:  Recent Labs Lab 05/14/17 0405 05/14/17 1756 05/15/17 0531 05/16/17 0448 05/17/17 0529  WBC 17.5* 19.5* 18.6* 10.5 9.8  NEUTROABS 14.6*  --   --   --   --   HGB 16.6 16.5 14.8 14.8 14.7  HCT 49.7 48.3 44.5 45.1 44.8  MCV 83.8 84.1 85.2 85.4 85.0  PLT 271 238 233 250 166   Basic Metabolic Panel:  Recent Labs Lab 05/14/17 0405 05/14/17 1630 05/15/17 0531 05/16/17 0448 05/17/17 0529    NA 139 139 139 141 140  K 4.8 4.8 4.5 3.7 3.3*  CL 100* 105 104 107 109  CO2 20* 19* 25 25 23   GLUCOSE 218* 287* 224* 159* 128*  BUN 23* 35* 35* 28* 25*  CREATININE 1.39* 1.58* 1.42* 1.07 1.05  CALCIUM 10.4* 9.6 9.3 8.7* 8.4*   GFR: Estimated Creatinine Clearance: 86.9 mL/min (by C-G formula based on SCr of 1.05 mg/dL). Liver Function Tests:  Recent Labs Lab 05/14/17 0405  AST 27  ALT 19  ALKPHOS 70  BILITOT 1.5*  PROT 8.4*  ALBUMIN 4.3    Recent Labs Lab 05/14/17 0405  LIPASE 23   No results for input(s): AMMONIA in the last 168 hours. Coagulation Profile: No results for input(s): INR, PROTIME in the last 168 hours. Cardiac Enzymes:  Recent Labs Lab 05/16/17 1527 05/16/17 1838 05/16/17 2308 05/17/17 0529  TROPONINI 0.06* 0.05* 0.05* 0.04*   BNP (last 3 results) No results for input(s): PROBNP in the last 8760 hours. HbA1C:  Recent Labs  05/15/17 1034  HGBA1C 8.4*   CBG:  Recent Labs Lab 05/16/17 1158 05/16/17 1649 05/16/17 2135 05/17/17 0752 05/17/17 1249  GLUCAP 167* 179* 158* 117* 134*   Lipid Profile: No results for input(s): CHOL, HDL, LDLCALC, TRIG, CHOLHDL, LDLDIRECT in the last 72 hours. Thyroid Function Tests: No results for input(s): TSH, T4TOTAL, FREET4, T3FREE, THYROIDAB in the last 72 hours. Anemia Panel: No results for input(s): VITAMINB12, FOLATE, FERRITIN, TIBC, IRON, RETICCTPCT in the last 72 hours. Urine analysis:    Component Value Date/Time   COLORURINE YELLOW 05/14/2017 [place] 05/14/2017 0737   LABSPEC 1.027 05/14/2017 0737   PHURINE 5.0 05/14/2017 0737   GLUCOSEU >=500 (A) 05/14/2017 0737   HGBUR MODERATE (A) 05/14/2017 0737   BILIRUBINUR NEGATIVE 05/14/2017 0737   KETONESUR 20 (A) 05/14/2017 0737   PROTEINUR 100 (A) 05/14/2017 0737   UROBILINOGEN 0.2 07/28/2014 1739   NITRITE NEGATIVE 05/14/2017 0737   LEUKOCYTESUR NEGATIVE 05/14/2017 0737     Thomas Wright M.D. [facility] 05/17/2017, 1:02 PM  Pager: [phone] Between 7am to 7pm - call Pager - [phone]  After 7pm go to [email or web address] - password [email or web address]  Call night coverage person covering after 7pm
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
## 2017-05-17 NOTE — Evaluation (Signed)
Occupational Therapy Evaluation Patient Details Name: Thomas Wright MRN: [id number] DOB: [birth year] Today's Date: 05/17/2017    History of Present Illness Pt is a 71 y/o male admitted secondary to intractable nausea/vomiting. CT performed and showed cholelithiasis. CT of the chest showed mild volume loss at R base. PMH includes HTN, DM, obesity, CKD, a fib, CHF, cardiomyopathy, s/p R BKA, and R finger amputation, and s/p cardiac valve replacement.    Clinical Impression   Pt at set up - sup level with ADLs, min guard A with ADL mobility using RW. Pt with decreased endurance for functional tasks. Pt has all necessary DME and A/E, will have 24/7 assist from significant other at home. No further acute OT indicated at this time, pt to continue with PT for functional mobility safety, endurance    Follow Up Recommendations  No OT follow up;Supervision/Assistance - 24 hour    Equipment Recommendations  None recommended by OT    Recommendations for Other Services       Precautions / Restrictions Precautions Precautions: Fall Precaution Comments: R BKA; has prosthesis  Restrictions Weight Bearing Restrictions: No      Mobility Bed Mobility Overal bed mobility: Modified Independent Bed Mobility: Sit to Supine       Sit to supine: Modified independent (Device/Increase time)   General bed mobility comments: pt got back into bed and was able to scoot ot [facility] without physical assist  Transfers Overall transfer level: Needs assistance Equipment used: Rolling walker (2 wheeled) Transfers: Sit to/from Stand Sit to Stand: Min guard         General transfer comment: using grab bars and 3 in 1 over toilet    Balance Overall balance assessment: Needs assistance Sitting-balance support: No upper extremity supported;Feet supported Sitting balance-Leahy Scale: Good Sitting balance - Comments: Able to doff R prosthesis in sitting    Standing balance support: Bilateral upper extremity  supported;During functional activity Standing balance-Leahy Scale: Poor Standing balance comment: Reliant on RW for stability                            ADL either performed or assessed with clinical judgement   ADL Overall ADL's : Needs assistance/impaired     Grooming: Standing;Supervision/safety   Upper Body Bathing: Set up;Sitting   Lower Body Bathing: Supervison/ safety;Sitting/lateral leans;Sit to/from stand   Upper Body Dressing : Set up;Sitting   Lower Body Dressing: Sit to/from stand;Supervision/safety;Sitting/lateral leans                       Vision Baseline Vision/History: Wears glasses Wears Glasses: Reading only Patient Visual Report: No change from baseline       Perception     Praxis      Pertinent Vitals/Pain Pain Assessment: 0-10 Pain Score: 2  Pain Location: stomach Pain Descriptors / Indicators: Discomfort Pain Intervention(s): Limited activity within patient's tolerance;Repositioned;Monitored during session;Relaxation     Hand Dominance Left   Extremity/Trunk Assessment Upper Extremity Assessment Upper Extremity Assessment: Generalized weakness   Lower Extremity Assessment Lower Extremity Assessment: Defer to PT evaluation   Cervical / Trunk Assessment Cervical / Trunk Assessment: Normal   Communication Communication Communication: No difficulties   Cognition Arousal/Alertness: Awake/alert Behavior During Therapy: WFL for tasks assessed/performed Overall Cognitive Status: Within Functional Limits for tasks assessed  General Comments   pt very pleasant and cooperative    Exercises     Shoulder Instructions      Home Living Family/patient expects to be discharged to:: Private residence Living Arrangements: Spouse/significant other Available Help at Discharge: Family;Available 24 hours/day Type of Home: House Home Access: Ramped entrance     Home Layout:  One level     Bathroom Shower/Tub: Teacher, early years/pre: Standard     Home Equipment: Environmental consultant - 2 wheels;Cane - single point;Bedside commode;Tub bench;Wheelchair - manual          Prior Functioning/Environment Level of Independence: Independent with assistive device(s)        Comments: Reports he used RW for longer distances and cane for shorter distances. Uses R prosthesis at baseline.         OT Problem List: Decreased activity tolerance      OT Treatment/Interventions:      OT Goals(Current goals can be found in the care plan section) Acute Rehab OT Goals Patient Stated Goal: to feel better  OT Goal Formulation: With patient  OT Frequency:     Barriers to D/C:    pt has all necessary DME and A/E, 24/7 assist from significant other       Co-evaluation              AM-PAC PT "6 Clicks" Daily Activity     Outcome Measure                 End of Session Equipment Utilized During Treatment: Rolling walker;Other (comment) (3 in 1)  Activity Tolerance: Patient tolerated treatment well;Patient limited by fatigue Patient left: in bed;with call bell/phone within reach  OT Visit Diagnosis: Unsteadiness on feet (R26.81);Muscle weakness (generalized) (M62.81)                Time: 3875-6433 OT Time Calculation (min): 23 min Charges:  OT General Charges $OT Visit: 1 Visit OT Evaluation $OT Eval Moderate Complexity: 1 Mod OT Treatments $Therapeutic Activity: 8-22 mins G-Codes: OT G-codes **NOT FOR INPATIENT CLASS** Functional Assessment Tool Used: AM-PAC 6 Clicks Daily Activity     Britt Bottom 05/17/2017, 1:13 PM

## 2017-05-17 NOTE — Progress Notes (Signed)
[facility] for lovenox Indication: atrial fibrillation  Allergies  Allergen Reactions  . Amoxicill-Clarithro-Omeprazole Diarrhea and Nausea And Vomiting    Patient Measurements: Height: 6\' 1"  (185.4 cm) Weight: 245 lb 4.8 oz (111.3 kg) IBW/kg (Calculated) : 79.9  Vital Signs: Temp: 98.2 F (36.8 C) (08/31 0540) Temp Source: Oral (08/31 0540) BP: 139/66 (08/31 0540) Pulse Rate: 85 (08/31 0540)  Labs:  Recent Labs  05/15/17 0531 05/16/17 0448  05/16/17 1838 05/16/17 2308 05/17/17 0529  HGB 14.8 14.8  --   --   --  14.7  HCT 44.5 45.1  --   --   --  44.8  PLT 233 250  --   --   --  206  CREATININE 1.42* 1.07  --   --   --  1.05  TROPONINI  --   --   < > 0.05* 0.05* 0.04*  < > = values in this interval not displayed.  Estimated Creatinine Clearance: 86.9 mL/min (by C-G formula based on SCr of 1.05 mg/dL).  Assessment: 53 yom presented to the ED with N/V. He is on chronic apixaban for history of afib. However, pt has been unable to consistently tolerate PO and is receiving Lovenox while apixaban on hold. CBC stable.   Goal of Therapy:  Anti-Xa level 0.6-1 units/ml 4hrs after LMWH dose given Monitor platelets by anticoagulation protocol: Yes   Plan:  1. Continue Lovenox 110mg  SQ Q12 hours  2. F/u CBC, S&S of bleeding 3. F/u ability to restart of apixaban when appropriate  Vincenza Hews, PharmD, BCPS 05/17/2017, 7:43 AM

## 2017-05-17 NOTE — Progress Notes (Signed)
Subjective: The patient was seen and examined at bedside. He reports no vomiting in the last 2 days it continues to have mild nausea. The abdominal pain is described as 3-5 out of 10 in intensity.  Objective: Vital signs in last 24 hours: Temp:  [98 F (36.7 C)-98.9 F (37.2 C)] 98 F (36.7 C) (08/31 1520) Pulse Rate:  [82-90] 90 (08/31 1520) Resp:  [16-18] 18 (08/31 1520) BP: (92-139)/(42-85) 111/77 (08/31 1520) SpO2:  [99 %-100 %] 100 % (08/31 1520) Weight:  [245 lb 4.8 oz (111.3 kg)] 245 lb 4.8 oz (111.3 kg) (08/31 0540) Weight change: 3 lb 1.6 oz (1.406 kg) Last BM Date: 05/12/17   Patient was getting a 2-D echo at bedside, limited physical examination was performed. PE: Not in acute distress GENERAL: No obvious pallor,no icterus  Lab Results: Results for orders placed or performed during the hospital encounter of 05/14/17 (from the past 48 hour(s))  Glucose, capillary     Status: Abnormal   Collection Time: 05/15/17  4:43 PM  Result Value Ref Range   Glucose-Capillary 154 (H) 65 - 99 mg/dL  Glucose, capillary     Status: Abnormal   Collection Time: 05/15/17 10:31 PM  Result Value Ref Range   Glucose-Capillary 166 (H) 65 - 99 mg/dL   Comment 1 Notify RN   Basic metabolic panel     Status: Abnormal   Collection Time: 05/16/17  4:48 AM  Result Value Ref Range   Sodium 141 135 - 145 mmol/L   Potassium 3.7 3.5 - 5.1 mmol/L   Chloride 107 101 - 111 mmol/L   CO2 25 22 - 32 mmol/L   Glucose, Bld 159 (H) 65 - 99 mg/dL   BUN 28 (H) 6 - 20 mg/dL   Creatinine, Ser 1.07 0.61 - 1.24 mg/dL   Calcium 8.7 (L) 8.9 - 10.3 mg/dL   GFR calc non Af Amer >60 >60 mL/min   GFR calc Af Amer >60 >60 mL/min    Comment: (NOTE) The eGFR has been calculated using the CKD EPI equation. This calculation has not been validated in all clinical situations. eGFR's persistently <60 mL/min signify possible Chronic Kidney Disease.    Anion gap 9 5 - 15  CBC     Status: None   Collection Time:  05/16/17  4:48 AM  Result Value Ref Range   WBC 10.5 4.0 - 10.5 K/uL   RBC 5.28 4.22 - 5.81 MIL/uL   Hemoglobin 14.8 13.0 - 17.0 g/dL   HCT 45.1 39.0 - 52.0 %   MCV 85.4 78.0 - 100.0 fL   MCH 28.0 26.0 - 34.0 pg   MCHC 32.8 30.0 - 36.0 g/dL   RDW 13.4 11.5 - 15.5 %   Platelets 250 150 - 400 K/uL  Glucose, capillary     Status: Abnormal   Collection Time: 05/16/17  8:12 AM  Result Value Ref Range   Glucose-Capillary 167 (H) 65 - 99 mg/dL  Glucose, capillary     Status: Abnormal   Collection Time: 05/16/17 11:58 AM  Result Value Ref Range   Glucose-Capillary 167 (H) 65 - 99 mg/dL  Troponin I     Status: Abnormal   Collection Time: 05/16/17  3:27 PM  Result Value Ref Range   Troponin I 0.06 (HH) <0.03 ng/mL    Comment: CRITICAL RESULT CALLED TO, READ BACK BY AND VERIFIED WITH: E CHEEK,RN 1649 05/16/2017 WBOND   Glucose, capillary     Status: Abnormal   Collection Time: 05/16/17  4:49 PM  Result Value Ref Range   Glucose-Capillary 179 (H) 65 - 99 mg/dL  Troponin I (q 6hr x 3)     Status: Abnormal   Collection Time: 05/16/17  6:38 PM  Result Value Ref Range   Troponin I 0.05 (HH) <0.03 ng/mL    Comment: CRITICAL VALUE NOTED.  VALUE IS CONSISTENT WITH PREVIOUSLY REPORTED AND CALLED VALUE.  Glucose, capillary     Status: Abnormal   Collection Time: 05/16/17  9:35 PM  Result Value Ref Range   Glucose-Capillary 158 (H) 65 - 99 mg/dL  Troponin I (q 6hr x 3)     Status: Abnormal   Collection Time: 05/16/17 11:08 PM  Result Value Ref Range   Troponin I 0.05 (HH) <0.03 ng/mL    Comment: CRITICAL VALUE NOTED.  VALUE IS CONSISTENT WITH PREVIOUSLY REPORTED AND CALLED VALUE.  Basic metabolic panel     Status: Abnormal   Collection Time: 05/17/17  5:29 AM  Result Value Ref Range   Sodium 140 135 - 145 mmol/L   Potassium 3.3 (L) 3.5 - 5.1 mmol/L   Chloride 109 101 - 111 mmol/L   CO2 23 22 - 32 mmol/L   Glucose, Bld 128 (H) 65 - 99 mg/dL   BUN 25 (H) 6 - 20 mg/dL   Creatinine, Ser  1.05 0.61 - 1.24 mg/dL   Calcium 8.4 (L) 8.9 - 10.3 mg/dL   GFR calc non Af Amer >60 >60 mL/min   GFR calc Af Amer >60 >60 mL/min    Comment: (NOTE) The eGFR has been calculated using the CKD EPI equation. This calculation has not been validated in all clinical situations. eGFR's persistently <60 mL/min signify possible Chronic Kidney Disease.    Anion gap 8 5 - 15  CBC     Status: None   Collection Time: 05/17/17  5:29 AM  Result Value Ref Range   WBC 9.8 4.0 - 10.5 K/uL   RBC 5.27 4.22 - 5.81 MIL/uL   Hemoglobin 14.7 13.0 - 17.0 g/dL   HCT 44.8 39.0 - 52.0 %   MCV 85.0 78.0 - 100.0 fL   MCH 27.9 26.0 - 34.0 pg   MCHC 32.8 30.0 - 36.0 g/dL   RDW 13.0 11.5 - 15.5 %   Platelets 206 150 - 400 K/uL  Troponin I (q 6hr x 3)     Status: Abnormal   Collection Time: 05/17/17  5:29 AM  Result Value Ref Range   Troponin I 0.04 (HH) <0.03 ng/mL    Comment: CRITICAL VALUE NOTED.  VALUE IS CONSISTENT WITH PREVIOUSLY REPORTED AND CALLED VALUE.  Glucose, capillary     Status: Abnormal   Collection Time: 05/17/17  7:52 AM  Result Value Ref Range   Glucose-Capillary 117 (H) 65 - 99 mg/dL  Glucose, capillary     Status: Abnormal   Collection Time: 05/17/17 12:49 PM  Result Value Ref Range   Glucose-Capillary 134 (H) 65 - 99 mg/dL    Studies/Results: No results found.  Medications: I have reviewed the patient's current medications.  Assessment: Nausea and vomiting likely related to gastroparesis Scheduled for a gastric empty scan today.  Plan: Vomiting has resolved, he continues to have mild nausea. Recommend starting patient on full liquid diet, advancing to low fiber and low residue diet. Advised patient to take small frequent meals and it is important to achieve good blood sugar control. If gastroparesis is noted on GES, patient may benefit from use of promotility agent such as  erythromycin.   Ronnette Juniper 05/17/2017, 3:53 PM   Pager [phone] If no answer or after 5 PM  call [phone]

## 2017-05-17 NOTE — Progress Notes (Signed)
  Echocardiogram 2D Echocardiogram has been performed.  Donata Clay 05/17/2017, 11:57 AM

## 2017-05-18 DIAGNOSIS — G43A1 Cyclical vomiting, intractable: Secondary | ICD-10-CM

## 2017-05-18 LAB — BASIC METABOLIC PANEL
Anion gap: 9 (ref 5–15)
BUN: 20 mg/dL (ref 6–20)
CHLORIDE: 106 mmol/L (ref 101–111)
CO2: 24 mmol/L (ref 22–32)
CREATININE: 1.07 mg/dL (ref 0.61–1.24)
Calcium: 8.5 mg/dL — ABNORMAL LOW (ref 8.9–10.3)
GFR calc Af Amer: >60 mL/min (ref >60)
GFR calc non Af Amer: >60 mL/min (ref >60)
Glucose, Bld: 139 mg/dL — ABNORMAL HIGH (ref 65–99)
POTASSIUM: 3.3 mmol/L — AB (ref 3.5–5.1)
Sodium: 139 mmol/L (ref 135–145)

## 2017-05-18 LAB — CBC
HEMATOCRIT: 44.6 % (ref 39.0–52.0)
Hemoglobin: 14.5 g/dL (ref 13.0–17.0)
MCH: 27 pg (ref 26.0–34.0)
MCHC: 32.5 g/dL (ref 30.0–36.0)
MCV: 82.9 fL (ref 78.0–100.0)
PLATELETS: 230 10*3/uL (ref 150–400)
RBC: 5.38 MIL/uL (ref 4.22–5.81)
RDW: 12.9 % (ref 11.5–15.5)
WBC: 10.4 10*3/uL (ref 4.0–10.5)

## 2017-05-18 LAB — GLUCOSE, CAPILLARY: GLUCOSE-CAPILLARY: 217 mg/dL — AB (ref 65–99)

## 2017-05-18 MED ORDER — INSULIN REGULAR HUMAN 100 UNIT/ML IJ SOLN: 3.0000 [IU] | Freq: Three times a day (TID) | INTRAMUSCULAR | 3 refills | Status: AC

## 2017-05-18 MED ORDER — ENOXAPARIN SODIUM 120 MG/0.8ML ~~LOC~~ SOLN: 110.0000 mg | Freq: Two times a day (BID) | SUBCUTANEOUS | Status: DC

## 2017-05-18 MED ORDER — SUCRALFATE 1 GM/10ML PO SUSP: 1.0000 g | Freq: Two times a day (BID) | ORAL | 0 refills | Status: DC

## 2017-05-18 MED ORDER — DOCUSATE SODIUM 283 MG RE ENEM
1.0000 | ENEMA | Freq: Once | RECTAL | Status: AC
  Administered 2017-05-18: 283 mg via RECTAL
  Filled 2017-05-18: qty 1

## 2017-05-18 MED ORDER — PANTOPRAZOLE SODIUM 40 MG PO TBEC: 40.0000 mg | DELAYED_RELEASE_TABLET | Freq: Two times a day (BID) | ORAL | Status: DC

## 2017-05-18 MED ORDER — ONDANSETRON 4 MG PO TBDP: 4.0000 mg | ORAL_TABLET | Freq: Three times a day (TID) | ORAL | 0 refills | Status: DC | PRN

## 2017-05-18 MED ORDER — PANTOPRAZOLE SODIUM 40 MG PO TBEC: DELAYED_RELEASE_TABLET | ORAL | 1 refills | Status: DC

## 2017-05-18 MED ORDER — APIXABAN 5 MG PO TABS: 5.0000 mg | ORAL_TABLET | Freq: Two times a day (BID) | ORAL | Status: DC

## 2017-05-18 MED ORDER — TOUJEO SOLOSTAR 300 UNIT/ML ~~LOC~~ SOPN: 15.0000 [IU] | PEN_INJECTOR | Freq: Every day | SUBCUTANEOUS | 0 refills | Status: DC

## 2017-05-18 MED ORDER — SENNOSIDES-DOCUSATE SODIUM 8.6-50 MG PO TABS: 2.0000 | ORAL_TABLET | Freq: Every evening | ORAL | 0 refills | Status: DC | PRN

## 2017-05-18 NOTE — Progress Notes (Signed)
Progress Note  Patient Name: Thomas Wright Date of Encounter: 05/18/2017  Primary Cardiologist: Dr Marlou Porch  Subjective   Doing well today, the patient denies CP or SOB.  No new concerns.  Ready to go home  Inpatient Medications    Scheduled Meds: . amiodarone  100 mg Oral Daily  . apixaban  5 mg Oral BID  . furosemide  40 mg Oral Daily  . insulin aspart  0-5 Units Subcutaneous QHS  . insulin aspart  0-9 Units Subcutaneous TID WC  . insulin glargine  14 Units Subcutaneous QHS  . pantoprazole  40 mg Oral BID AC  . senna-docusate  2 tablet Oral QHS  . sodium chloride flush  3 mL Intravenous Q12H  . sucralfate  1 g Oral BID   Continuous Infusions: . sodium chloride     PRN Meds: sodium chloride, hydrALAZINE, nitroGLYCERIN, [DISCONTINUED] ondansetron **OR** ondansetron (ZOFRAN) IV, promethazine, sodium chloride flush   Vital Signs    Vitals:   05/17/17 2016 05/18/17 0000 05/18/17 0643 05/18/17 1200  BP: 130/69 94/71 (!) 121/58 91/60  Pulse: 89 (!) 105 93 63  Resp: 16 15  20   Temp: 97.6 F (36.4 C)  98.3 F (36.8 C) 98.7 F (37.1 C)  TempSrc: Oral  Oral Oral  SpO2: 96% 100% 98% 100%  Weight:   248 lb 11.2 oz (112.8 kg)   Height:        Intake/Output Summary (Last 24 hours) at 05/18/17 1333 Last data filed at 05/18/17 0830  Gross per 24 hour  Intake             1726 ml  Output              250 ml  Net             1476 ml   Filed Weights   05/16/17 0500 05/17/17 0540 05/18/17 0643  Weight: 242 lb 3.2 oz (109.9 kg) 245 lb 4.8 oz (111.3 kg) 248 lb 11.2 oz (112.8 kg)    Physical Exam   GEN- The patient is well appearing, alert and oriented x 3 today.   Head- normocephalic, atraumatic Eyes-  Sclera clear, conjunctiva pink Ears- hearing intact Oropharynx- clear Neck- supple, Lungs- Clear to ausculation bilaterally, normal work of breathing Heart- Regular rate and rhythm  GI- soft, NT, ND, + BS Extremities- s/p BKA MS- no significant deformity or  atrophy Skin- no rash or lesion Psych- euthymic mood, full affect Neuro- strength and sensation are intact   Labs    Chemistry Recent Labs Lab 05/14/17 0405  05/16/17 0448 05/17/17 0529 05/18/17 0628  NA 139  < > 141 140 139  K 4.8  < > 3.7 3.3* 3.3*  CL 100*  < > 107 109 106  CO2 20*  < > 25 23 24   GLUCOSE 218*  < > 159* 128* 139*  BUN 23*  < > 28* 25* 20  CREATININE 1.39*  < > 1.07 1.05 1.07  CALCIUM 10.4*  < > 8.7* 8.4* 8.5*  PROT 8.4*  --   --   --   --   ALBUMIN 4.3  --   --   --   --   AST 27  --   --   --   --   ALT 19  --   --   --   --   ALKPHOS 70  --   --   --   --   BILITOT 1.5*  --   --   --   --  GFRNONAA 50*  < > >60 >60 >60  GFRAA 58*  < > >60 >60 >60  ANIONGAP 19*  < > 9 8 9   < > = values in this interval not displayed.   Hematology Recent Labs Lab 05/16/17 0448 05/17/17 0529 05/18/17 0628  WBC 10.5 9.8 10.4  RBC 5.28 5.27 5.38  HGB 14.8 14.7 14.5  HCT 45.1 44.8 44.6  MCV 85.4 85.0 82.9  MCH 28.0 27.9 27.0  MCHC 32.8 32.8 32.5  RDW 13.4 13.0 12.9  PLT 250 206 230    Cardiac Enzymes Recent Labs Lab 05/16/17 1527 05/16/17 1838 05/16/17 2308 05/17/17 0529  TROPONINI 0.06* 0.05* 0.05* 0.04*   No results for input(s): TROPIPOC in the last 168 hours.     Patient Profile     71 y.o. male followed by cardiology for problems noted below:  Assessment & Plan    1. Atypical chest pain Cath reveals no ischemia No further inpatient workup planned  2. Chronic combined CHF euvolemic No changes Given LBBB, could consider CRT-P or CRT-D device in the future.  Dr Marlou Porch to continue medical optimization in the interim.  3. Bioprosthetic aortic valve Stable No change required today  4. Paroxysmal atrial fibrillation Though he did have afib here, he is now back in sinus No changes to home regimen  Plans for discharge are noted Cardiology team to see as needed while here. Please call with questions.   Thompson Grayer MD,  [facility] 05/18/2017 1:33 PM

## 2017-05-18 NOTE — Discharge Summary (Signed)
Physician Discharge Summary   Patient ID: Thomas Wright MRN: [id number] DOB/AGE: 1946/04/13 71 y.o.  Admit date: 05/14/2017 Discharge date: 05/18/2017  Primary Care Physician:  Leighton Ruff, MD  Discharge Diagnoses:    . Intractable nausea and vomiting possibly secondary to GERD/dysmotility versus side effect from trulicity . Chronic combined systolic and diastolic congestive heart failure ([place]) . PAF (paroxysmal atrial fibrillation) ([place]) . Leukocytosis   Atypical chest pain likely from GERD   Metabolic acidosis with mild DKA   Consults: Cardiology Gastroenterology  Recommendations for Outpatient Follow-up:  1. Please repeat CBC/BMET at next visit 2. Patient has planned colonoscopy on 9/12, GI recommending outpatient adding endoscopy on the same date   DIET:carb modified diet    Allergies:   Allergies  Allergen Reactions  . Amoxicill-Clarithro-Omeprazole Diarrhea and Nausea And Vomiting     DISCHARGE MEDICATIONS: Current Discharge Medication List    START taking these medications   Details  ondansetron (ZOFRAN ODT) 4 MG disintegrating tablet Take 1 tablet (4 mg total) by mouth every 8 (eight) hours as needed for nausea or vomiting. Qty: 30 tablet, Refills: 0    pantoprazole (PROTONIX) 40 MG tablet Take twice a day for 2 weeks until your GI appointment, then daily Qty: 60 tablet, Refills: 1    senna-docusate (SENOKOT-S) 8.6-50 MG tablet Take 2 tablets by mouth at bedtime as needed for mild constipation. Qty: 30 tablet, Refills: 0    sucralfate (CARAFATE) 1 GM/10ML suspension Take 10 mLs (1 g total) by mouth 2 (two) times daily. Qty: 420 mL, Refills: 0      CONTINUE these medications which have CHANGED   Details  insulin regular (NOVOLIN R) 100 units/mL injection Inject 0.03 mLs (3 Units total) into the skin 3 (three) times daily before meals. Qty: 20 mL, Refills: 3    TOUJEO SOLOSTAR 300 UNIT/ML SOPN Inject 15 Units into the skin daily. Qty: 5 pen,  Refills: 0      CONTINUE these medications which have NOT CHANGED   Details  amiodarone (PACERONE) 100 MG tablet Take 1 tablet (100 mg total) by mouth daily. Qty: 90 tablet, Refills: 1    apixaban (ELIQUIS) 5 MG TABS tablet Take 1 tablet (5 mg total) by mouth 2 (two) times daily. Qty: 180 tablet, Refills: 2    atorvastatin (LIPITOR) 10 MG tablet Take 1 tablet (10 mg total) by mouth every morning. Qty: 90 tablet, Refills: 2    carvedilol (COREG) 6.25 MG tablet Take 6.25 mg by mouth 2 (two) times daily with a meal.    DHA-EPA-Coenzyme Q10-Vitamin E (CO Q-10 VITAMIN E FISH OIL PO) Take 1,000 mg by mouth 2 (two) times daily.    furosemide (LASIX) 40 MG tablet Take 1 tablet (40 mg total) by mouth daily. Qty: 30 tablet, Refills: 1    Multiple Vitamin (MULTIVITAMIN) tablet Take 1 tablet by mouth daily.    ACCU-CHEK SMARTVIEW test strip USE AS INSTRUCTED TO CHECK BLOOD SUGAR FOUR TIMES DAILY Qty: 400 each, Refills: 0    Blood Glucose Monitoring Suppl (ACCU-CHEK NANO SMARTVIEW) w/Device KIT USE TO CHECK BLOOD SUGAR FOUR TIMES PER DAY (NEEDS APPOINTMENT) Qty: 1 kit, Refills: 0    Insulin Pen Needle 31G X 5 MM MISC 1 each by Does not apply route 4 (four) times daily -  before meals and at bedtime. Qty: 300 each, Refills: 0      STOP taking these medications     JARDIANCE 10 MG TABS tablet      metFORMIN (  GLUCOPHAGE-XR) 500 MG 24 hr tablet      TRULICITY 1.5 IN/8.6VE SOPN          Brief H and P: For complete details please refer to admission H and P, but in brief Thomas Wright a 71 y.o.malewith medical history significant of diabetes, systolic and diastolic congestive heart failure, depression, hyperlipidemia, hypertension, PAF, status post aVR. Presenting with 3 day history of nausea vomiting and abdominal discomfort. Initially started out very mild but becoming more severe and constant. Symptoms started a few hours after starting first injection of Trulicity. Patient also  reported some loose stools. Patient states he also has stopped his insulin as he was told not to take along the trulicity. Had little oral intake over this period of time due to the fact that every time he eats or drinks, doesn't become more nauseous and had nonbloody nonbilious emesis. Denied chest pain, palpitations, fevers, symptoms, headache, dysuria, frequency, focal neurological deficit, rash.  Hospital Course:   Intractable nausea and vomiting: possibly due to medication reaction from trulicity versus gastroenteritis, rule out any other intra-abdominal pathology - KUB normal with no ileus or obstruction - CT abdomen showed cholelithiasis but no cholecystitis or other acute findings - Lipase normal on admission LFTs normal  - the patient was placed on IV fluid hydration, antiemetics - placed on IV PPI, GI consulted (c/o belching, heart burn), recommended GE empty study to rule out gastroparesis, however patient's symptoms are improving and GE study not available over the weekend - Patient was seen by Dr. Paulita Fujita prior to discharge and recommended outpatient endoscopy to be added on 9/12 and patient is already scheduled for colonoscopy - Patient will be placed on proton pump inhibitor twice a day until his GI follow-up and then may decrease to once a day   Metabolic acidosis with mild DKA - Anion gap 19, with lactic acidosis at the time of admission likely due to significant dehydration and poor oral intake - Jardiance and trulicity discontinued at discharge  - given significant lactic acidosis at the time of admission, continued GI upset, will hold metformin - Patient continued on insulin toujeo 15 units at bedtime and NovoLog 3 units with meals     PAF (paroxysmal atrial fibrillation) ([place]) - currently rate controlled, continue amiodarone, Coreg - resumed eliquis     Chronic combined systolic and diastolic congestive heart failure ([place]) - currently stable, last echo with EF of 20%  and grade 3 diastolic dysfunction     CKD (chronic kidney disease), stage III - patient was placed on gentle hydration, creatinine improving, baseline 1.3 - creatinine improved 1.0  Chest pain -troponins positive, EKG did not show acute ST-T wave changes, patient has left bundle branch block compared to previous EKGs - 2-D echo showed EF of 30-35% with diffuse hypokinesis, worse in anterior wall and inferior akinesis. Cardiology was consulted -patient underwent cardiac cath, showed an no significant CAD, normal LVEDP, medical management recommended   Day of Discharge BP (!) 121/58 (BP Location: Left Arm)   Pulse 93   Temp 98.3 F (36.8 C) (Oral)   Resp 15   Ht 6' 1"  (1.854 m)   Wt 112.8 kg (248 lb 11.2 oz) Comment: b scale  SpO2 98%   BMI 32.81 kg/m   Physical Exam: General: Alert and awake oriented x3 not in any acute distress. HEENT: anicteric sclera, pupils reactive to light and accommodation CVS: S1-S2 clear no murmur rubs or gallops Chest: clear to auscultation bilaterally, no wheezing  rales or rhonchi Abdomen: soft nontender, nondistended, normal bowel sounds Extremities: no cyanosis, clubbing or edema noted bilaterally Neuro: Cranial nerves II-XII intact, no focal neurological deficits   The results of significant diagnostics from this hospitalization (including imaging, microbiology, ancillary and laboratory) are listed below for reference.    LAB RESULTS: Basic Metabolic Panel:  Recent Labs Lab 05/17/17 0529 05/18/17 0628  NA 140 139  K 3.3* 3.3*  CL 109 106  CO2 23 24  GLUCOSE 128* 139*  BUN 25* 20  CREATININE 1.05 1.07  CALCIUM 8.4* 8.5*   Liver Function Tests:  Recent Labs Lab 05/14/17 0405  AST 27  ALT 19  ALKPHOS 70  BILITOT 1.5*  PROT 8.4*  ALBUMIN 4.3    Recent Labs Lab 05/14/17 0405  LIPASE 23   No results for input(s): AMMONIA in the last 168 hours. CBC:  Recent Labs Lab 05/14/17 0405  05/17/17 0529 05/18/17 0628  WBC  17.5*  < > 9.8 10.4  NEUTROABS 14.6*  --   --   --   HGB 16.6  < > 14.7 14.5  HCT 49.7  < > 44.8 44.6  MCV 83.8  < > 85.0 82.9  PLT 271  < > 206 230  < > = values in this interval not displayed. Cardiac Enzymes:  Recent Labs Lab 05/16/17 2308 05/17/17 0529  TROPONINI 0.05* 0.04*   BNP: Invalid input(s): POCBNP CBG:  Recent Labs Lab 05/17/17 1249 05/18/17 1148  GLUCAP 134* 217*    Significant Diagnostic Studies:  Ct Abdomen Pelvis Wo Contrast  Result Date: 05/15/2017 CLINICAL DATA:  Lower abdominal pain for 5 days. nausea and vomiting. Cholelithiasis. EXAM: CT ABDOMEN AND PELVIS WITHOUT CONTRAST TECHNIQUE: Multidetector CT imaging of the abdomen and pelvis was performed following the standard protocol without IV contrast. COMPARISON:  None. FINDINGS: Lower chest: No acute findings. Hepatobiliary: No masses visualized on this unenhanced exam. Tiny gallstones are noted, however there is no evidence of cholecystitis or biliary dilatation. Pancreas: No mass or inflammatory process visualized on this unenhanced exam. Spleen:  Within normal limits in size. Adrenals/Urinary tract: No evidence of urolithiasis or hydronephrosis. Small subcapsular right renal cyst noted. Unremarkable unopacified urinary bladder. Stomach/Bowel: No evidence of obstruction, inflammatory process, or abnormal fluid collections. Vascular/Lymphatic: No pathologically enlarged lymph nodes identified. No evidence of abdominal aortic aneurysm. Aortic atherosclerosis. Reproductive:  No mass or other significant abnormality. Other:  None. Musculoskeletal:  No suspicious bone lesions identified. IMPRESSION: Cholelithiasis. No radiographic evidence of cholecystitis or other acute findings. Aortic atherosclerosis. Electronically Signed   By: Earle Gell M.D.   On: 05/15/2017 11:18   Dg Chest 2 View  Result Date: 05/14/2017 CLINICAL DATA:  Left chest pain, history of hypertension and diabetes, followup of small anterior  mediastinal lesion EXAM: CHEST  2 VIEW COMPARISON:  CT chest of 02/26/2017 and chest x-ray of 01/30/2016 FINDINGS: The lungs are not well aerated with persistent chronic elevation of the right hemidiaphragm and some resulting right basilar volume loss. Mediastinal and hilar contours are unremarkable with cardiomegaly remaining stable. Aortic valve replacement is present. Median sternotomy sutures are noted. No acute bony abnormality is seen. IMPRESSION: Poor inspiration with mild volume loss at the right lung base and elevation of the right hemidiaphragm. No definite active process. Electronically Signed   By: Ivar Drape M.D.   On: 05/14/2017 09:06   Dg Abd 1 View  Result Date: 05/14/2017 CLINICAL DATA:  Left-sided abdominal pain for several days, initial encounter EXAM: ABDOMEN -  1 VIEW COMPARISON:  None. FINDINGS: Degenerative changes of lumbar spine are seen. Scattered large and small bowel gas is noted. Calcification of the vas deferens is noted bilaterally. No abnormal mass or abnormal calcifications are seen. IMPRESSION: No acute abnormality noted. Electronically Signed   By: Inez Catalina M.D.   On: 05/14/2017 08:32    2D ECHO: Study Conclusions  - Left ventricle: LVEF is approximately 30 to 35% with diffuse   hypokinesis, worse in the anterior wall and inferior akinesis.   Since report fo July 2018 this is mildly improved. The cavity   size was mildly dilated. Wall thickness was increased in a   pattern of mild LVH. - Aortic valve: AV is thickened with some restricted motion,   particularly R coronary cusp Valve is well seated Peak and mean   gradients through the valve are 22 and 14 mm Hg respectively This   is not significantly changed from echo report of July 2018. - Mitral valve: Calcified annulus. Mildly thickened, mildly   calcified leaflets . - Right ventricle: Systolic function was mildly reduced.  Disposition and Follow-up: Discharge Instructions    Diet Carb Modified     Complete by:  As directed    Discharge instructions    Complete by:  As directed    It is VERY IMPORTANT that you follow up with a PCP on a regular basis.  Check your blood glucoses before each meal and at bedtime and maintain a log of your readings.  Bring this log with you when you follow up with your PCP so that he or she can adjust your insulin at your follow up visit.  Please stop Jardiance and Trulicity. Hold metformin until follow-up with your doctor.   Increase activity slowly    Complete by:  As directed        DISPOSITION: home    [place]    Wilford Corner, MD. Schedule an appointment as soon as possible for a visit in 2 week(s).   Specialty:  Gastroenterology Contact information: [address] [place] [address] [phone]        Leighton Ruff, MD. Schedule an appointment as soon as possible for a visit in 2 week(s).   Specialty:  Family Medicine Contact information: [place] [address] [phone]            Time spent on Discharge: 25mns   Signed:   REstill CottaM.D. [facility] 05/18/2017, 12:35 PM Pager: [phone]

## 2017-05-18 NOTE — Care Management Note (Signed)
Case Management Note  Patient Details  Name: DERIN GRANQUIST MRN: [id number] Date of Birth: 1945/09/29  Subjective/Objective:                 Spoke to patient he would like to use [facility] for [facility] PT, requested order from Dr Tana Coast. Patient denies other CM assistance. Referral placed.    Action/Plan:   Expected Discharge Date:  05/18/17               Expected Discharge Plan:  Home/Self Care  In-House Referral:     Discharge planning Services  CM Consult  Post Acute Care Choice:  Home Health Choice offered to:  Patient  DME Arranged:    DME Agency:     HH Arranged:  PT [place]:  [place]  Status of Service:  Completed, signed off  If discussed at [place] of Stay Meetings, dates discussed:    Additional Comments:  Carles Collet, RN 05/18/2017, 1:14 PM

## 2017-05-18 NOTE — Progress Notes (Signed)
Pt discharged home, IV and tele removed. Discharge instructions given.

## 2017-05-18 NOTE — Progress Notes (Signed)
Subjective: No vomiting, slight nausea. Tolerating soft diet. Has post-prandial dyspepsia.  Objective: Vital signs in last 24 hours: Temp:  [97.6 F (36.4 C)-98.6 F (37 C)] 98.3 F (36.8 C) (09/01 0643) Pulse Rate:  [75-105] 93 (09/01 0643) Resp:  [12-18] 15 (09/01 0000) BP: (94-175)/(58-94) 121/58 (09/01 0643) SpO2:  [96 %-100 %] 98 % (09/01 0643) Weight:  [248 lb 11.2 oz (112.8 kg)] 248 lb 11.2 oz (112.8 kg) (09/01 0643) Weight change: 3 lb 6.4 oz (1.542 kg) Last BM Date: 05/18/17  PE: GEN:  NAD, overweight ABD:  Protuberant, soft, non-tender.  Lab Results: CBC    Component Value Date/Time   WBC 10.4 05/18/2017 0628   RBC 5.38 05/18/2017 0628   HGB 14.5 05/18/2017 0628   HGB 15.4 09/26/2016 0913   HCT 44.6 05/18/2017 0628   HCT 44.7 09/26/2016 0913   PLT 230 05/18/2017 0628   PLT 273 09/26/2016 0913   MCV 82.9 05/18/2017 0628   MCV 85 09/26/2016 0913   MCH 27.0 05/18/2017 0628   MCHC 32.5 05/18/2017 0628   RDW 12.9 05/18/2017 0628   RDW 13.4 09/26/2016 0913   LYMPHSABS 1.9 05/14/2017 0405   MONOABS 1.0 05/14/2017 0405   EOSABS 0.0 05/14/2017 0405   BASOSABS 0.0 05/14/2017 0405   CMP     Component Value Date/Time   NA 139 05/18/2017 0628   NA 134 09/26/2016 0913   K 3.3 (L) 05/18/2017 0628   CL 106 05/18/2017 0628   CO2 24 05/18/2017 0628   GLUCOSE 139 (H) 05/18/2017 0628   BUN 20 05/18/2017 0628   BUN 21 09/26/2016 0913   CREATININE 1.07 05/18/2017 0628   CALCIUM 8.5 (L) 05/18/2017 0628   PROT 8.4 (H) 05/14/2017 0405   PROT 7.1 09/26/2016 0913   ALBUMIN 4.3 05/14/2017 0405   ALBUMIN 4.3 09/26/2016 0913   AST 27 05/14/2017 0405   ALT 19 05/14/2017 0405   ALKPHOS 70 05/14/2017 0405   BILITOT 1.5 (H) 05/14/2017 0405   BILITOT 0.6 09/26/2016 0913   GFRNONAA >60 05/18/2017 0628   GFRAA >60 05/18/2017 2671   Assessment:  1.  Nausea.  May be GERD, dysmotility. 2.  Chest pain.  Catheterization yesterday unremarkable. 3.  Dyspepsia.  Plan:  1.   OK to discharge patient home today from GI perspective. 2.  Take pantoprazole 40 mg po bid and ondansetron 4mg  PO/SL every 6 hrs prn as outpatient. 3.  Will discuss with Dr. Michail Sermon, for consideration of adding endoscopy on to his already-scheduled outpatient colonoscopy. 4.  Don't see need to keep patient in hospital for several days just to get gastric emptying study; moreover, since he's improving, not sure he really needs the test at the present time.   Landry Dyke 05/18/2017, 12:18 PM   Cell [phone] If no answer or after 5 PM call [phone]

## 2017-05-20 LAB — CULTURE, BLOOD (ROUTINE X 2)
CULTURE: NO GROWTH
CULTURE: NO GROWTH
SPECIAL REQUESTS: ADEQUATE
Special Requests: ADEQUATE

## 2017-05-21 ENCOUNTER — Encounter (HOSPITAL_COMMUNITY): Payer: Self-pay | Admitting: Cardiology

## 2017-05-22 ENCOUNTER — Encounter: Payer: Medicare HMO | Admitting: Vascular Surgery

## 2017-05-22 ENCOUNTER — Encounter (HOSPITAL_COMMUNITY): Payer: Self-pay | Admitting: *Deleted

## 2017-05-24 ENCOUNTER — Other Ambulatory Visit: Payer: Self-pay | Admitting: Gastroenterology

## 2017-05-24 DIAGNOSIS — R112 Nausea with vomiting, unspecified: Secondary | ICD-10-CM | POA: Diagnosis not present

## 2017-05-24 DIAGNOSIS — K3184 Gastroparesis: Secondary | ICD-10-CM | POA: Diagnosis not present

## 2017-05-24 DIAGNOSIS — E1165 Type 2 diabetes mellitus with hyperglycemia: Secondary | ICD-10-CM | POA: Diagnosis not present

## 2017-05-24 DIAGNOSIS — Z794 Long term (current) use of insulin: Secondary | ICD-10-CM | POA: Diagnosis not present

## 2017-05-24 DIAGNOSIS — I480 Paroxysmal atrial fibrillation: Secondary | ICD-10-CM | POA: Diagnosis not present

## 2017-05-24 DIAGNOSIS — R0989 Other specified symptoms and signs involving the circulatory and respiratory systems: Secondary | ICD-10-CM | POA: Diagnosis not present

## 2017-05-25 DIAGNOSIS — N183 Chronic kidney disease, stage 3 (moderate): Secondary | ICD-10-CM | POA: Diagnosis not present

## 2017-05-25 DIAGNOSIS — I130 Hypertensive heart and chronic kidney disease with heart failure and stage 1 through stage 4 chronic kidney disease, or unspecified chronic kidney disease: Secondary | ICD-10-CM | POA: Diagnosis not present

## 2017-05-25 DIAGNOSIS — F329 Major depressive disorder, single episode, unspecified: Secondary | ICD-10-CM | POA: Diagnosis not present

## 2017-05-25 DIAGNOSIS — I429 Cardiomyopathy, unspecified: Secondary | ICD-10-CM | POA: Diagnosis not present

## 2017-05-25 DIAGNOSIS — I480 Paroxysmal atrial fibrillation: Secondary | ICD-10-CM | POA: Diagnosis not present

## 2017-05-25 DIAGNOSIS — K219 Gastro-esophageal reflux disease without esophagitis: Secondary | ICD-10-CM | POA: Diagnosis not present

## 2017-05-25 DIAGNOSIS — E785 Hyperlipidemia, unspecified: Secondary | ICD-10-CM | POA: Diagnosis not present

## 2017-05-25 DIAGNOSIS — I5042 Chronic combined systolic (congestive) and diastolic (congestive) heart failure: Secondary | ICD-10-CM | POA: Diagnosis not present

## 2017-05-25 DIAGNOSIS — E1122 Type 2 diabetes mellitus with diabetic chronic kidney disease: Secondary | ICD-10-CM | POA: Diagnosis not present

## 2017-05-27 DIAGNOSIS — E785 Hyperlipidemia, unspecified: Secondary | ICD-10-CM | POA: Diagnosis not present

## 2017-05-27 DIAGNOSIS — N183 Chronic kidney disease, stage 3 (moderate): Secondary | ICD-10-CM | POA: Diagnosis not present

## 2017-05-27 DIAGNOSIS — I480 Paroxysmal atrial fibrillation: Secondary | ICD-10-CM | POA: Diagnosis not present

## 2017-05-27 DIAGNOSIS — E1122 Type 2 diabetes mellitus with diabetic chronic kidney disease: Secondary | ICD-10-CM | POA: Diagnosis not present

## 2017-05-27 DIAGNOSIS — I429 Cardiomyopathy, unspecified: Secondary | ICD-10-CM | POA: Diagnosis not present

## 2017-05-27 DIAGNOSIS — I5042 Chronic combined systolic (congestive) and diastolic (congestive) heart failure: Secondary | ICD-10-CM | POA: Diagnosis not present

## 2017-05-27 DIAGNOSIS — K219 Gastro-esophageal reflux disease without esophagitis: Secondary | ICD-10-CM | POA: Diagnosis not present

## 2017-05-27 DIAGNOSIS — I130 Hypertensive heart and chronic kidney disease with heart failure and stage 1 through stage 4 chronic kidney disease, or unspecified chronic kidney disease: Secondary | ICD-10-CM | POA: Diagnosis not present

## 2017-05-27 DIAGNOSIS — F329 Major depressive disorder, single episode, unspecified: Secondary | ICD-10-CM | POA: Diagnosis not present

## 2017-05-28 DIAGNOSIS — I429 Cardiomyopathy, unspecified: Secondary | ICD-10-CM | POA: Diagnosis not present

## 2017-05-28 DIAGNOSIS — F329 Major depressive disorder, single episode, unspecified: Secondary | ICD-10-CM | POA: Diagnosis not present

## 2017-05-28 DIAGNOSIS — N183 Chronic kidney disease, stage 3 (moderate): Secondary | ICD-10-CM | POA: Diagnosis not present

## 2017-05-28 DIAGNOSIS — I130 Hypertensive heart and chronic kidney disease with heart failure and stage 1 through stage 4 chronic kidney disease, or unspecified chronic kidney disease: Secondary | ICD-10-CM | POA: Diagnosis not present

## 2017-05-28 DIAGNOSIS — I5042 Chronic combined systolic (congestive) and diastolic (congestive) heart failure: Secondary | ICD-10-CM | POA: Diagnosis not present

## 2017-05-28 DIAGNOSIS — I480 Paroxysmal atrial fibrillation: Secondary | ICD-10-CM | POA: Diagnosis not present

## 2017-05-28 DIAGNOSIS — E785 Hyperlipidemia, unspecified: Secondary | ICD-10-CM | POA: Diagnosis not present

## 2017-05-28 DIAGNOSIS — K219 Gastro-esophageal reflux disease without esophagitis: Secondary | ICD-10-CM | POA: Diagnosis not present

## 2017-05-28 DIAGNOSIS — E1122 Type 2 diabetes mellitus with diabetic chronic kidney disease: Secondary | ICD-10-CM | POA: Diagnosis not present

## 2017-05-29 ENCOUNTER — Ambulatory Visit (HOSPITAL_COMMUNITY): Payer: Medicare HMO | Admitting: Certified Registered Nurse Anesthetist

## 2017-05-29 ENCOUNTER — Ambulatory Visit (HOSPITAL_COMMUNITY)
Admission: RE | Admit: 2017-05-29 | Discharge: 2017-05-29 | Disposition: A | Discharge status: Home / Self Care | Payer: Medicare HMO | Source: Ambulatory Visit | Attending: Gastroenterology | Admitting: Gastroenterology

## 2017-05-29 ENCOUNTER — Other Ambulatory Visit: Payer: Self-pay | Admitting: Gastroenterology

## 2017-05-29 ENCOUNTER — Encounter (HOSPITAL_COMMUNITY): Payer: Self-pay

## 2017-05-29 ENCOUNTER — Encounter (HOSPITAL_COMMUNITY)
Admission: RE | Disposition: A | Discharge status: Home / Self Care | Payer: Self-pay | Source: Ambulatory Visit | Attending: Gastroenterology

## 2017-05-29 DIAGNOSIS — K640 First degree hemorrhoids: Secondary | ICD-10-CM | POA: Diagnosis not present

## 2017-05-29 DIAGNOSIS — I110 Hypertensive heart disease with heart failure: Secondary | ICD-10-CM | POA: Insufficient documentation

## 2017-05-29 DIAGNOSIS — Z86010 Personal history of colonic polyps: Secondary | ICD-10-CM | POA: Insufficient documentation

## 2017-05-29 DIAGNOSIS — I509 Heart failure, unspecified: Secondary | ICD-10-CM | POA: Insufficient documentation

## 2017-05-29 DIAGNOSIS — Z881 Allergy status to other antibiotic agents status: Secondary | ICD-10-CM | POA: Insufficient documentation

## 2017-05-29 DIAGNOSIS — Z794 Long term (current) use of insulin: Secondary | ICD-10-CM | POA: Diagnosis not present

## 2017-05-29 DIAGNOSIS — I4891 Unspecified atrial fibrillation: Secondary | ICD-10-CM | POA: Insufficient documentation

## 2017-05-29 DIAGNOSIS — I10 Essential (primary) hypertension: Secondary | ICD-10-CM | POA: Diagnosis not present

## 2017-05-29 DIAGNOSIS — Z79899 Other long term (current) drug therapy: Secondary | ICD-10-CM | POA: Diagnosis not present

## 2017-05-29 DIAGNOSIS — Z888 Allergy status to other drugs, medicaments and biological substances status: Secondary | ICD-10-CM | POA: Diagnosis not present

## 2017-05-29 DIAGNOSIS — Z1211 Encounter for screening for malignant neoplasm of colon: Secondary | ICD-10-CM | POA: Diagnosis not present

## 2017-05-29 DIAGNOSIS — E119 Type 2 diabetes mellitus without complications: Secondary | ICD-10-CM | POA: Insufficient documentation

## 2017-05-29 HISTORY — PX: COLONOSCOPY WITH PROPOFOL: SHX5780

## 2017-05-29 LAB — GLUCOSE, CAPILLARY: GLUCOSE-CAPILLARY: 122 mg/dL — AB (ref 65–99)

## 2017-05-29 SURGERY — COLONOSCOPY WITH PROPOFOL
Anesthesia: Monitor Anesthesia Care

## 2017-05-29 SURGERY — ESOPHAGOGASTRODUODENOSCOPY (EGD) WITH PROPOFOL
Anesthesia: Monitor Anesthesia Care

## 2017-05-29 MED ORDER — PHENYLEPHRINE 40 MCG/ML (10ML) SYRINGE FOR IV PUSH (FOR BLOOD PRESSURE SUPPORT)
PREFILLED_SYRINGE | INTRAVENOUS | Status: AC
  Filled 2017-05-29: qty 10

## 2017-05-29 MED ORDER — PROPOFOL 500 MG/50ML IV EMUL
INTRAVENOUS | Status: DC | PRN
  Administered 2017-05-29: 75 ug/kg/min via INTRAVENOUS

## 2017-05-29 MED ORDER — SODIUM CHLORIDE 0.9 % IV SOLN: INTRAVENOUS | Status: DC

## 2017-05-29 MED ORDER — ONDANSETRON HCL 4 MG/2ML IJ SOLN
INTRAMUSCULAR | Status: AC
  Filled 2017-05-29: qty 2

## 2017-05-29 MED ORDER — LIDOCAINE 2% (20 MG/ML) 5 ML SYRINGE
INTRAMUSCULAR | Status: AC
  Filled 2017-05-29: qty 5

## 2017-05-29 MED ORDER — ONDANSETRON HCL 4 MG/2ML IJ SOLN
INTRAMUSCULAR | Status: DC | PRN
  Administered 2017-05-29: 4 mg via INTRAVENOUS

## 2017-05-29 MED ORDER — PROPOFOL 10 MG/ML IV BOLUS
INTRAVENOUS | Status: AC
  Filled 2017-05-29: qty 40

## 2017-05-29 MED ORDER — PROPOFOL 10 MG/ML IV BOLUS
INTRAVENOUS | Status: DC | PRN
  Administered 2017-05-29 (×2): 10 mg via INTRAVENOUS

## 2017-05-29 MED ORDER — LACTATED RINGERS IV SOLN
INTRAVENOUS | Status: DC
  Administered 2017-05-29: 08:00:00 via INTRAVENOUS

## 2017-05-29 SURGICAL SUPPLY — 24 items

## 2017-05-29 NOTE — H&P (Signed)
Date of Initial H&P: 05/23/17  History reviewed, patient examined, no change in status, stable for surgery.

## 2017-05-29 NOTE — Anesthesia Preprocedure Evaluation (Addendum)
Anesthesia Evaluation  Patient identified by MRN, date of birth, ID band Patient awake    Reviewed: Allergy & Precautions, H&P , NPO status , Patient's Chart, lab work & pertinent test results  History of Anesthesia Complications Negative for: history of anesthetic complications  Airway Mallampati: II  TM Distance: >3 FB Neck ROM: Full    Dental  (+) Teeth Intact   Pulmonary neg pulmonary ROS, neg shortness of breath,    breath sounds clear to auscultation       Cardiovascular hypertension, Pt. on medications and Pt. on home beta blockers (-) angina+CHF  (-) Past MI and (-) DOE + dysrhythmias Atrial Fibrillation  Rhythm:Irregular     Neuro/Psych negative neurological ROS  negative psych ROS   GI/Hepatic negative GI ROS, Neg liver ROS,   Endo/Other  diabetes, Type 2  Renal/GU negative Renal ROS     Musculoskeletal   Abdominal   Peds  Hematology negative hematology ROS (+)   Anesthesia Other Findings EF 35% From 8-18 echo  Reproductive/Obstetrics                            Anesthesia Physical  Anesthesia Plan  ASA: III  Anesthesia Plan: MAC   Post-op Pain Management:    Induction: Intravenous  PONV Risk Score and Plan: Treatment may vary due to age or medical condition and Ondansetron  Airway Management Planned: Simple Face Mask and Natural Airway  Additional Equipment: None  Intra-op Plan:   Post-operative Plan:   Informed Consent: I have reviewed the patients History and Physical, chart, labs and discussed the procedure including the risks, benefits and alternatives for the proposed anesthesia with the patient or authorized representative who has indicated his/her understanding and acceptance.   Dental advisory given  Plan Discussed with: Surgeon and CRNA  Anesthesia Plan Comments:         Anesthesia Quick Evaluation

## 2017-05-29 NOTE — Discharge Instructions (Signed)
YOU HAD AN ENDOSCOPIC PROCEDURE TODAY: Refer to the procedure report and other information in the discharge instructions given to you for any specific questions about what was found during the examination. If this information does not answer your questions, please call [facility] office at [phone] to clarify.   YOU SHOULD EXPECT: Some feelings of bloating in the abdomen. Passage of more gas than usual. Walking can help get rid of the air that was put into your GI tract during the procedure and reduce the bloating. If you had a lower endoscopy (such as a colonoscopy or flexible sigmoidoscopy) you may notice spotting of blood in your stool or on the toilet paper. Some abdominal soreness may be present for a day or two, also.  DIET: Your first meal following the procedure should be a light meal and then it is ok to progress to your normal diet. A half-sandwich or bowl of soup is an example of a good first meal. Heavy or fried foods are harder to digest and may make you feel nauseous or bloated. Drink plenty of fluids but you should avoid alcoholic beverages for 24 hours. If you had a esophageal dilation, please see attached instructions for diet.   ACTIVITY: Your care partner should take you home directly after the procedure. You should plan to take it easy, moving slowly for the rest of the day. You can resume normal activity the day after the procedure however YOU SHOULD NOT DRIVE, use power tools, machinery or perform tasks that involve climbing or major physical exertion for 24 hours (because of the sedation medicines used during the test).   SYMPTOMS TO REPORT IMMEDIATELY: A gastroenterologist can be reached at any hour. Please call [phone]  for any of the following symptoms:  Following lower endoscopy (colonoscopy, flexible sigmoidoscopy) Excessive amounts of blood in the stool  Significant tenderness, worsening of abdominal pains  Swelling of the abdomen that is new, acute  Fever of 100 or  higher   FOLLOW UP:  If any biopsies were taken you will be contacted by phone or by letter within the next 1-3 weeks. Call [phone]  if you have not heard about the biopsies in 3 weeks.  Please also call with any specific questions about appointments or follow up tests.  Repeat colonoscopy in 5 years.

## 2017-05-29 NOTE — Anesthesia Procedure Notes (Signed)
Procedure Name: MAC Date/Time: 05/29/2017 8:33 AM Performed by: West Pugh Pre-anesthesia Checklist: Patient identified, Emergency Drugs available, Suction available, Patient being monitored and Timeout performed Patient Re-evaluated:Patient Re-evaluated prior to induction Oxygen Delivery Method: Nasal cannula Placement Confirmation: CO2 detector,  positive ETCO2 and breath sounds checked- equal and bilateral Dental Injury: Teeth and Oropharynx as per pre-operative assessment

## 2017-05-29 NOTE — Transfer of Care (Signed)
Immediate Anesthesia Transfer of Care Note  Patient: Thomas Wright  Procedure(s) Performed: Procedure(s): COLONOSCOPY WITH PROPOFOL (N/A)  Patient Location: PACU  Anesthesia Type:MAC  Level of Consciousness:  sedated, patient cooperative and responds to stimulation  Airway & Oxygen Therapy:Patient Spontanous Breathing and Patient connected to face mask oxgen  Post-op Assessment:  Report given to PACU RN and Post -op Vital signs reviewed and stable  Post vital signs:  Reviewed and stable  Last Vitals:  Vitals:   05/29/17 0817  BP: (!) 168/96  Pulse: (!) 102  Resp: (!) 22  Temp: 36.6 C  SpO2: 50%    Complications: No apparent anesthesia complications

## 2017-05-29 NOTE — Op Note (Signed)
[facility] Patient Name: Thomas Wright Procedure Date: 05/29/2017 MRN: [id number] Attending MD: Lear Ng , MD Date of Birth: 07/27/1946 CSN: [id number] Age: 71 Admit Type: Outpatient Procedure:                Colonoscopy Indications:              High risk colon cancer surveillance: Personal                            history of colonic polyps, Last colonoscopy: April                            2013 Providers:                Lear Ng, MD, Laverta Baltimore RN, RN,                            Marcene Duos, Technician Referring MD:              Medicines:                Propofol per Anesthesia, Monitored Anesthesia Care Complications:            No immediate complications. Estimated Blood Loss:     Estimated blood loss: none. Procedure:                Pre-Anesthesia Assessment:                           - Prior to the procedure, a History and Physical                            was performed, and patient medications and                            allergies were reviewed. The patient's tolerance of                            previous anesthesia was also reviewed. The risks                            and benefits of the procedure and the sedation                            options and risks were discussed with the patient.                            All questions were answered, and informed consent                            was obtained. Prior Anticoagulants: The patient has                            taken Eliquis (apixaban), last dose was 4 days  prior to procedure. ASA Grade Assessment: III - A                            patient with severe systemic disease. After                            reviewing the risks and benefits, the patient was                            deemed in satisfactory condition to undergo the                            procedure.                           After obtaining informed consent, the colonoscope                             was passed under direct vision. Throughout the                            procedure, the patient's blood pressure, pulse, and                            oxygen saturations were monitored continuously. The                            EC-[id number] ([id number]) scope was introduced through                            the anus and advanced to the the cecum, identified                            by appendiceal orifice and ileocecal valve. The                            colonoscopy was performed without difficulty. The                            patient tolerated the procedure well. The quality                            of the bowel preparation was adequate and good. The                            ileocecal valve, appendiceal orifice, and rectum                            were photographed. Scope In: 3:76:28 AM Scope Out: 8:56:24 AM Scope Withdrawal Time: 0 hours 3 minutes 53 seconds  Total Procedure Duration: 0 hours 10 minutes 5 seconds  Findings:      The perianal and digital rectal examinations were normal.      Internal hemorrhoids were found during retroflexion. The hemorrhoids  were small and Grade I (internal hemorrhoids that do not prolapse).      The exam was otherwise normal throughout the examined colon. Impression:               - Internal hemorrhoids.                           - No specimens collected. Moderate Sedation:      N/A- Per Anesthesia Care Recommendation:           - Patient has a contact number available for                            emergencies. The signs and symptoms of potential                            delayed complications were discussed with the                            patient. Return to normal activities tomorrow.                            Written discharge instructions were provided to the                            patient.                           - Resume previous diet.                           - Resume Eliquis  (apixaban) at prior dose today.                           - Repeat colonoscopy in 5 years for surveillance. Procedure Code(s):        --- Professional ---                           [phone], Colonoscopy, flexible; diagnostic, including                            collection of specimen(s) by brushing or washing,                            when performed (separate procedure) Diagnosis Code(s):        --- Professional ---                           Z86.010, Personal history of colonic polyps                           K64.0, First degree hemorrhoids CPT copyright 2016 [employer]. All rights reserved. The codes documented in this report are preliminary and upon coder review may  be revised to meet current compliance requirements. Lear Ng, MD 05/29/2017 9:15:01 AM This report has been signed electronically. Number of Addenda: 0

## 2017-05-29 NOTE — Interval H&P Note (Signed)
History and Physical Interval Note:  05/29/2017 8:25 AM  Thomas Wright  has presented today for surgery, with the diagnosis of History of colon polyps  The various methods of treatment have been discussed with the patient and family. After consideration of risks, benefits and other options for treatment, the patient has consented to  Procedure(s): COLONOSCOPY WITH PROPOFOL (N/A) as a surgical intervention .  The patient's history has been reviewed, patient examined, no change in status, stable for surgery.  I have reviewed the patient's chart and labs.  Questions were answered to the patient's satisfaction.     [place] C.

## 2017-05-29 NOTE — Anesthesia Postprocedure Evaluation (Signed)
Anesthesia Post Note  Patient: Thomas Wright  Procedure(s) Performed: Procedure(s) (LRB): COLONOSCOPY WITH PROPOFOL (N/A)     Patient location during evaluation: PACU Anesthesia Type: MAC Level of consciousness: awake and alert Pain management: pain level controlled Vital Signs Assessment: post-procedure vital signs reviewed and stable Respiratory status: spontaneous breathing, nonlabored ventilation, respiratory function stable and patient connected to nasal cannula oxygen Cardiovascular status: stable and blood pressure returned to baseline Anesthetic complications: no    Last Vitals:  Vitals:   05/29/17 0910 05/29/17 0920  BP: (!) 168/92 (!) 168/86  Pulse: 82 95  Resp: (!) 29 19  Temp:    SpO2: 100% 99%    Last Pain:  Vitals:   05/29/17 0904  TempSrc: Oral  PainSc:                  Riccardo Dubin

## 2017-05-30 ENCOUNTER — Encounter (HOSPITAL_COMMUNITY): Payer: Self-pay | Admitting: Gastroenterology

## 2017-05-30 DIAGNOSIS — E785 Hyperlipidemia, unspecified: Secondary | ICD-10-CM | POA: Diagnosis not present

## 2017-05-30 DIAGNOSIS — I5042 Chronic combined systolic (congestive) and diastolic (congestive) heart failure: Secondary | ICD-10-CM | POA: Diagnosis not present

## 2017-05-30 DIAGNOSIS — K219 Gastro-esophageal reflux disease without esophagitis: Secondary | ICD-10-CM | POA: Diagnosis not present

## 2017-05-30 DIAGNOSIS — E1122 Type 2 diabetes mellitus with diabetic chronic kidney disease: Secondary | ICD-10-CM | POA: Diagnosis not present

## 2017-05-30 DIAGNOSIS — I130 Hypertensive heart and chronic kidney disease with heart failure and stage 1 through stage 4 chronic kidney disease, or unspecified chronic kidney disease: Secondary | ICD-10-CM | POA: Diagnosis not present

## 2017-05-30 DIAGNOSIS — N183 Chronic kidney disease, stage 3 (moderate): Secondary | ICD-10-CM | POA: Diagnosis not present

## 2017-05-30 DIAGNOSIS — I480 Paroxysmal atrial fibrillation: Secondary | ICD-10-CM | POA: Diagnosis not present

## 2017-05-30 DIAGNOSIS — I429 Cardiomyopathy, unspecified: Secondary | ICD-10-CM | POA: Diagnosis not present

## 2017-05-30 DIAGNOSIS — F329 Major depressive disorder, single episode, unspecified: Secondary | ICD-10-CM | POA: Diagnosis not present

## 2017-06-01 DIAGNOSIS — I480 Paroxysmal atrial fibrillation: Secondary | ICD-10-CM | POA: Diagnosis not present

## 2017-06-01 DIAGNOSIS — I130 Hypertensive heart and chronic kidney disease with heart failure and stage 1 through stage 4 chronic kidney disease, or unspecified chronic kidney disease: Secondary | ICD-10-CM | POA: Diagnosis not present

## 2017-06-01 DIAGNOSIS — K219 Gastro-esophageal reflux disease without esophagitis: Secondary | ICD-10-CM | POA: Diagnosis not present

## 2017-06-01 DIAGNOSIS — E1122 Type 2 diabetes mellitus with diabetic chronic kidney disease: Secondary | ICD-10-CM | POA: Diagnosis not present

## 2017-06-01 DIAGNOSIS — E785 Hyperlipidemia, unspecified: Secondary | ICD-10-CM | POA: Diagnosis not present

## 2017-06-01 DIAGNOSIS — N183 Chronic kidney disease, stage 3 (moderate): Secondary | ICD-10-CM | POA: Diagnosis not present

## 2017-06-01 DIAGNOSIS — I429 Cardiomyopathy, unspecified: Secondary | ICD-10-CM | POA: Diagnosis not present

## 2017-06-01 DIAGNOSIS — I5042 Chronic combined systolic (congestive) and diastolic (congestive) heart failure: Secondary | ICD-10-CM | POA: Diagnosis not present

## 2017-06-01 DIAGNOSIS — F329 Major depressive disorder, single episode, unspecified: Secondary | ICD-10-CM | POA: Diagnosis not present

## 2017-06-03 DIAGNOSIS — I5042 Chronic combined systolic (congestive) and diastolic (congestive) heart failure: Secondary | ICD-10-CM | POA: Diagnosis not present

## 2017-06-03 DIAGNOSIS — I480 Paroxysmal atrial fibrillation: Secondary | ICD-10-CM | POA: Diagnosis not present

## 2017-06-03 DIAGNOSIS — I130 Hypertensive heart and chronic kidney disease with heart failure and stage 1 through stage 4 chronic kidney disease, or unspecified chronic kidney disease: Secondary | ICD-10-CM | POA: Diagnosis not present

## 2017-06-03 DIAGNOSIS — E785 Hyperlipidemia, unspecified: Secondary | ICD-10-CM | POA: Diagnosis not present

## 2017-06-03 DIAGNOSIS — N183 Chronic kidney disease, stage 3 (moderate): Secondary | ICD-10-CM | POA: Diagnosis not present

## 2017-06-03 DIAGNOSIS — F329 Major depressive disorder, single episode, unspecified: Secondary | ICD-10-CM | POA: Diagnosis not present

## 2017-06-03 DIAGNOSIS — I429 Cardiomyopathy, unspecified: Secondary | ICD-10-CM | POA: Diagnosis not present

## 2017-06-03 DIAGNOSIS — K219 Gastro-esophageal reflux disease without esophagitis: Secondary | ICD-10-CM | POA: Diagnosis not present

## 2017-06-03 DIAGNOSIS — E1122 Type 2 diabetes mellitus with diabetic chronic kidney disease: Secondary | ICD-10-CM | POA: Diagnosis not present

## 2017-06-04 DIAGNOSIS — E1122 Type 2 diabetes mellitus with diabetic chronic kidney disease: Secondary | ICD-10-CM | POA: Diagnosis not present

## 2017-06-04 DIAGNOSIS — I5042 Chronic combined systolic (congestive) and diastolic (congestive) heart failure: Secondary | ICD-10-CM | POA: Diagnosis not present

## 2017-06-04 DIAGNOSIS — I429 Cardiomyopathy, unspecified: Secondary | ICD-10-CM | POA: Diagnosis not present

## 2017-06-04 DIAGNOSIS — N183 Chronic kidney disease, stage 3 (moderate): Secondary | ICD-10-CM | POA: Diagnosis not present

## 2017-06-04 DIAGNOSIS — F329 Major depressive disorder, single episode, unspecified: Secondary | ICD-10-CM | POA: Diagnosis not present

## 2017-06-04 DIAGNOSIS — E785 Hyperlipidemia, unspecified: Secondary | ICD-10-CM | POA: Diagnosis not present

## 2017-06-04 DIAGNOSIS — I130 Hypertensive heart and chronic kidney disease with heart failure and stage 1 through stage 4 chronic kidney disease, or unspecified chronic kidney disease: Secondary | ICD-10-CM | POA: Diagnosis not present

## 2017-06-04 DIAGNOSIS — K219 Gastro-esophageal reflux disease without esophagitis: Secondary | ICD-10-CM | POA: Diagnosis not present

## 2017-06-04 DIAGNOSIS — I480 Paroxysmal atrial fibrillation: Secondary | ICD-10-CM | POA: Diagnosis not present

## 2017-06-06 DIAGNOSIS — I5042 Chronic combined systolic (congestive) and diastolic (congestive) heart failure: Secondary | ICD-10-CM | POA: Diagnosis not present

## 2017-06-06 DIAGNOSIS — E1122 Type 2 diabetes mellitus with diabetic chronic kidney disease: Secondary | ICD-10-CM | POA: Diagnosis not present

## 2017-06-06 DIAGNOSIS — I480 Paroxysmal atrial fibrillation: Secondary | ICD-10-CM | POA: Diagnosis not present

## 2017-06-06 DIAGNOSIS — F329 Major depressive disorder, single episode, unspecified: Secondary | ICD-10-CM | POA: Diagnosis not present

## 2017-06-06 DIAGNOSIS — I429 Cardiomyopathy, unspecified: Secondary | ICD-10-CM | POA: Diagnosis not present

## 2017-06-06 DIAGNOSIS — I130 Hypertensive heart and chronic kidney disease with heart failure and stage 1 through stage 4 chronic kidney disease, or unspecified chronic kidney disease: Secondary | ICD-10-CM | POA: Diagnosis not present

## 2017-06-06 DIAGNOSIS — K219 Gastro-esophageal reflux disease without esophagitis: Secondary | ICD-10-CM | POA: Diagnosis not present

## 2017-06-06 DIAGNOSIS — E785 Hyperlipidemia, unspecified: Secondary | ICD-10-CM | POA: Diagnosis not present

## 2017-06-06 DIAGNOSIS — N183 Chronic kidney disease, stage 3 (moderate): Secondary | ICD-10-CM | POA: Diagnosis not present

## 2017-06-10 DIAGNOSIS — E785 Hyperlipidemia, unspecified: Secondary | ICD-10-CM | POA: Diagnosis not present

## 2017-06-10 DIAGNOSIS — K219 Gastro-esophageal reflux disease without esophagitis: Secondary | ICD-10-CM | POA: Diagnosis not present

## 2017-06-10 DIAGNOSIS — E1122 Type 2 diabetes mellitus with diabetic chronic kidney disease: Secondary | ICD-10-CM | POA: Diagnosis not present

## 2017-06-10 DIAGNOSIS — I5042 Chronic combined systolic (congestive) and diastolic (congestive) heart failure: Secondary | ICD-10-CM | POA: Diagnosis not present

## 2017-06-10 DIAGNOSIS — I429 Cardiomyopathy, unspecified: Secondary | ICD-10-CM | POA: Diagnosis not present

## 2017-06-10 DIAGNOSIS — F329 Major depressive disorder, single episode, unspecified: Secondary | ICD-10-CM | POA: Diagnosis not present

## 2017-06-10 DIAGNOSIS — I130 Hypertensive heart and chronic kidney disease with heart failure and stage 1 through stage 4 chronic kidney disease, or unspecified chronic kidney disease: Secondary | ICD-10-CM | POA: Diagnosis not present

## 2017-06-10 DIAGNOSIS — I480 Paroxysmal atrial fibrillation: Secondary | ICD-10-CM | POA: Diagnosis not present

## 2017-06-10 DIAGNOSIS — N183 Chronic kidney disease, stage 3 (moderate): Secondary | ICD-10-CM | POA: Diagnosis not present

## 2017-06-11 DIAGNOSIS — I130 Hypertensive heart and chronic kidney disease with heart failure and stage 1 through stage 4 chronic kidney disease, or unspecified chronic kidney disease: Secondary | ICD-10-CM | POA: Diagnosis not present

## 2017-06-11 DIAGNOSIS — N183 Chronic kidney disease, stage 3 (moderate): Secondary | ICD-10-CM | POA: Diagnosis not present

## 2017-06-11 DIAGNOSIS — E1122 Type 2 diabetes mellitus with diabetic chronic kidney disease: Secondary | ICD-10-CM | POA: Diagnosis not present

## 2017-06-11 DIAGNOSIS — K219 Gastro-esophageal reflux disease without esophagitis: Secondary | ICD-10-CM | POA: Diagnosis not present

## 2017-06-11 DIAGNOSIS — I5042 Chronic combined systolic (congestive) and diastolic (congestive) heart failure: Secondary | ICD-10-CM | POA: Diagnosis not present

## 2017-06-11 DIAGNOSIS — I480 Paroxysmal atrial fibrillation: Secondary | ICD-10-CM | POA: Diagnosis not present

## 2017-06-11 DIAGNOSIS — I429 Cardiomyopathy, unspecified: Secondary | ICD-10-CM | POA: Diagnosis not present

## 2017-06-11 DIAGNOSIS — E785 Hyperlipidemia, unspecified: Secondary | ICD-10-CM | POA: Diagnosis not present

## 2017-06-11 DIAGNOSIS — F329 Major depressive disorder, single episode, unspecified: Secondary | ICD-10-CM | POA: Diagnosis not present

## 2017-06-13 DIAGNOSIS — E785 Hyperlipidemia, unspecified: Secondary | ICD-10-CM | POA: Diagnosis not present

## 2017-06-13 DIAGNOSIS — N183 Chronic kidney disease, stage 3 (moderate): Secondary | ICD-10-CM | POA: Diagnosis not present

## 2017-06-13 DIAGNOSIS — I5042 Chronic combined systolic (congestive) and diastolic (congestive) heart failure: Secondary | ICD-10-CM | POA: Diagnosis not present

## 2017-06-13 DIAGNOSIS — I480 Paroxysmal atrial fibrillation: Secondary | ICD-10-CM | POA: Diagnosis not present

## 2017-06-13 DIAGNOSIS — E1122 Type 2 diabetes mellitus with diabetic chronic kidney disease: Secondary | ICD-10-CM | POA: Diagnosis not present

## 2017-06-13 DIAGNOSIS — F329 Major depressive disorder, single episode, unspecified: Secondary | ICD-10-CM | POA: Diagnosis not present

## 2017-06-13 DIAGNOSIS — I130 Hypertensive heart and chronic kidney disease with heart failure and stage 1 through stage 4 chronic kidney disease, or unspecified chronic kidney disease: Secondary | ICD-10-CM | POA: Diagnosis not present

## 2017-06-13 DIAGNOSIS — I429 Cardiomyopathy, unspecified: Secondary | ICD-10-CM | POA: Diagnosis not present

## 2017-06-13 DIAGNOSIS — K219 Gastro-esophageal reflux disease without esophagitis: Secondary | ICD-10-CM | POA: Diagnosis not present

## 2017-06-18 DIAGNOSIS — E1122 Type 2 diabetes mellitus with diabetic chronic kidney disease: Secondary | ICD-10-CM | POA: Diagnosis not present

## 2017-06-18 DIAGNOSIS — I130 Hypertensive heart and chronic kidney disease with heart failure and stage 1 through stage 4 chronic kidney disease, or unspecified chronic kidney disease: Secondary | ICD-10-CM | POA: Diagnosis not present

## 2017-06-18 DIAGNOSIS — K219 Gastro-esophageal reflux disease without esophagitis: Secondary | ICD-10-CM | POA: Diagnosis not present

## 2017-06-18 DIAGNOSIS — I429 Cardiomyopathy, unspecified: Secondary | ICD-10-CM | POA: Diagnosis not present

## 2017-06-18 DIAGNOSIS — I480 Paroxysmal atrial fibrillation: Secondary | ICD-10-CM | POA: Diagnosis not present

## 2017-06-18 DIAGNOSIS — I5042 Chronic combined systolic (congestive) and diastolic (congestive) heart failure: Secondary | ICD-10-CM | POA: Diagnosis not present

## 2017-06-18 DIAGNOSIS — F329 Major depressive disorder, single episode, unspecified: Secondary | ICD-10-CM | POA: Diagnosis not present

## 2017-06-18 DIAGNOSIS — E785 Hyperlipidemia, unspecified: Secondary | ICD-10-CM | POA: Diagnosis not present

## 2017-06-18 DIAGNOSIS — N183 Chronic kidney disease, stage 3 (moderate): Secondary | ICD-10-CM | POA: Diagnosis not present

## 2017-06-18 IMAGING — CT CT CHEST HIGH RESOLUTION W/O CM
2 of 5 series · 15 of 36 positions shown, 18 images · non-contrast
Comparison: 01/30/2016 chest radiograph.

CLINICAL DATA: Worsening dyspnea on amiodarone therapy.

EXAM:
CT CHEST WITHOUT CONTRAST
TECHNIQUE: Multidetector CT imaging of the chest was performed following the
standard protocol without intravenous contrast. High resolution
imaging of the lungs, as well as inspiratory and expiratory imaging,
was performed.

[Series 4: high resolution · axial · 0.80mm/px · z∈[-327,-51]mm · 12 of 152 slices shown, 15 images]
[im 7/152  mediastinal]
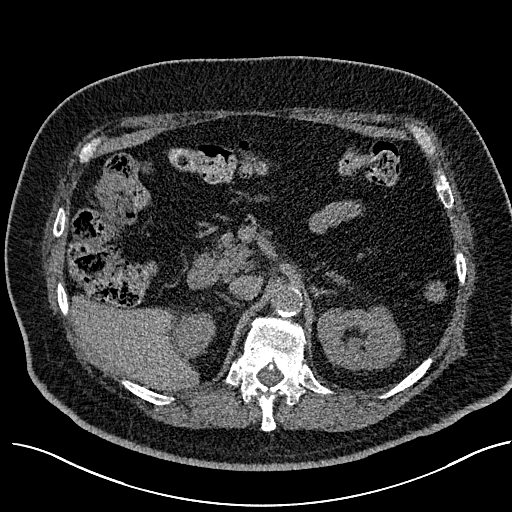
[im 7/152  lung]
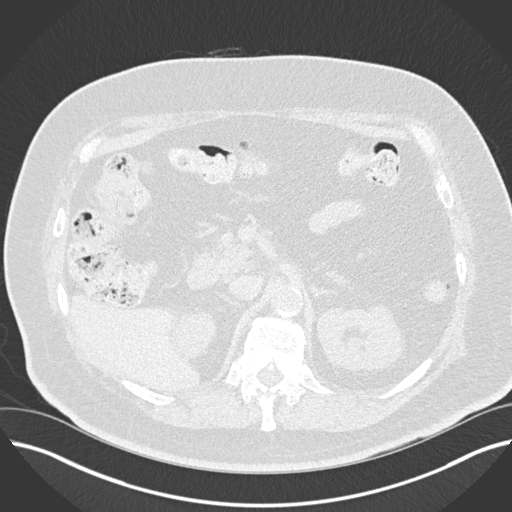
[im 21/152  lung]
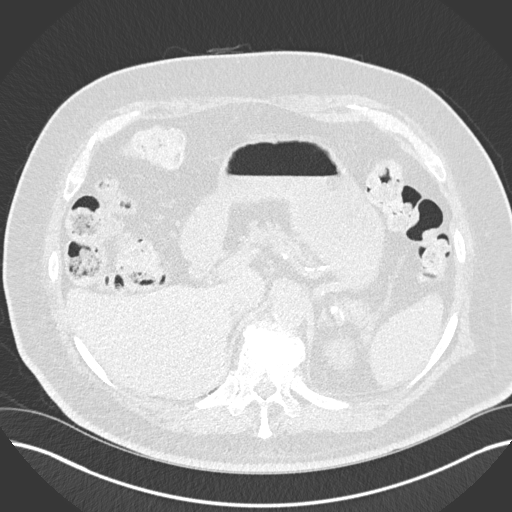
[im 35/152  lung]
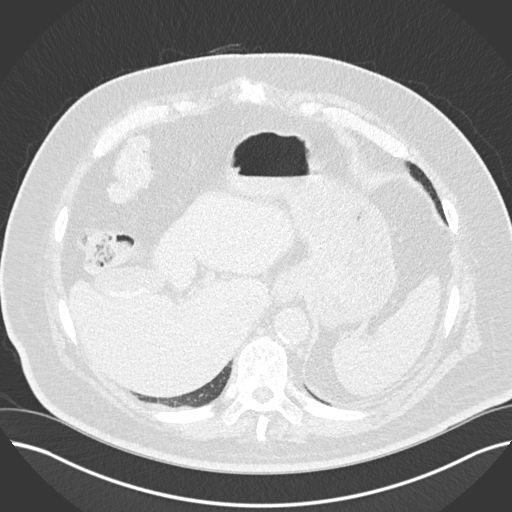
[im 49/152  lung]
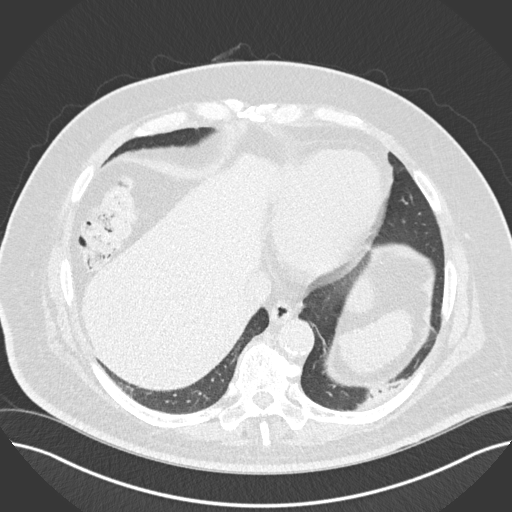
[im 55/152  mediastinal]
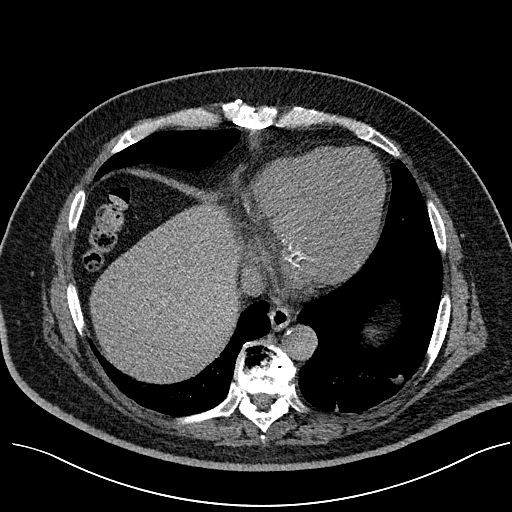
[im 55/152  lung]
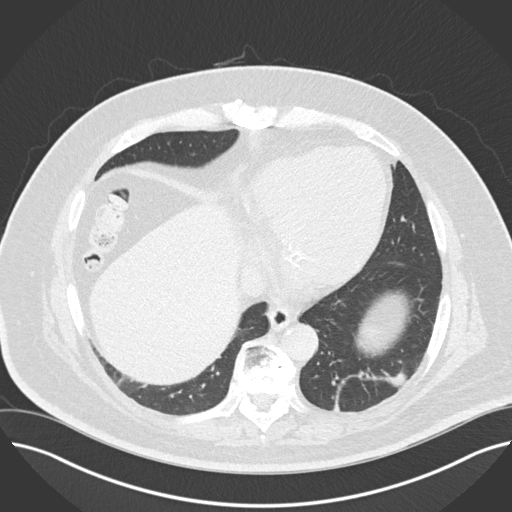
[im 69/152  lung]
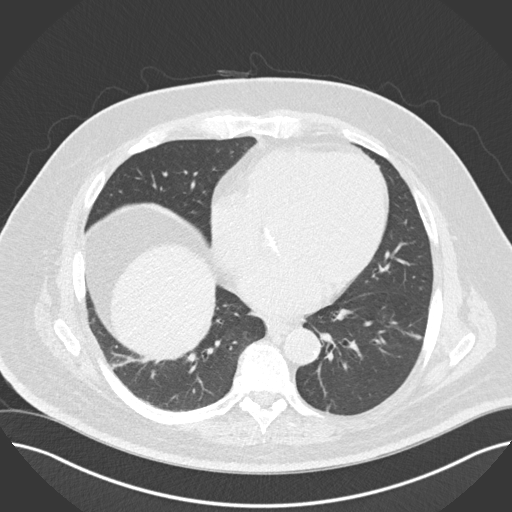
[im 83/152  lung]
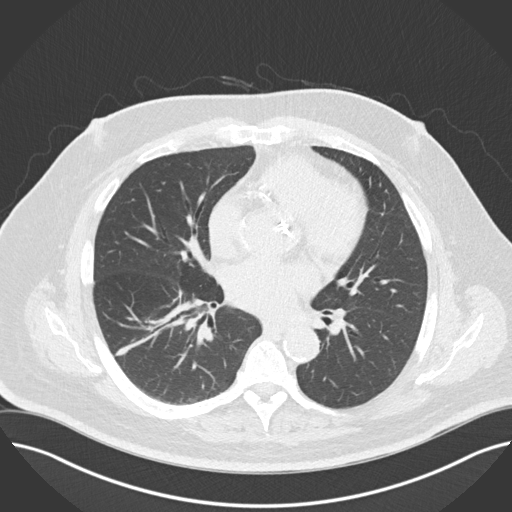
[im 97/152  lung]
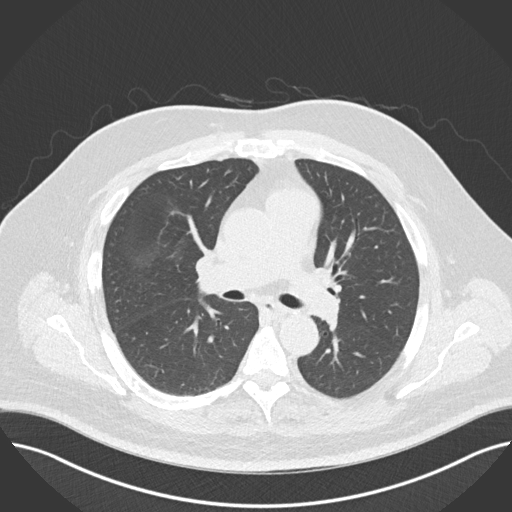
[im 103/152  mediastinal]
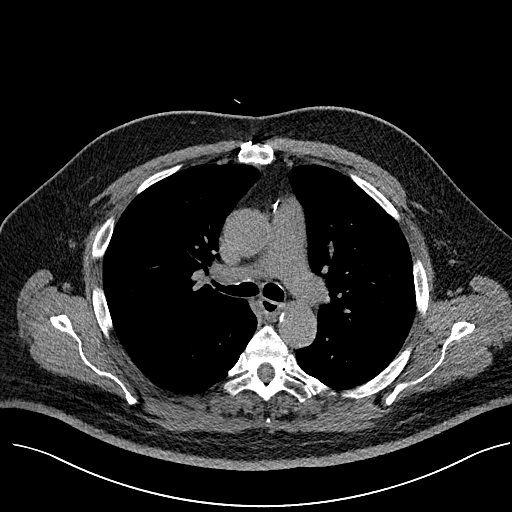
[im 103/152  lung]
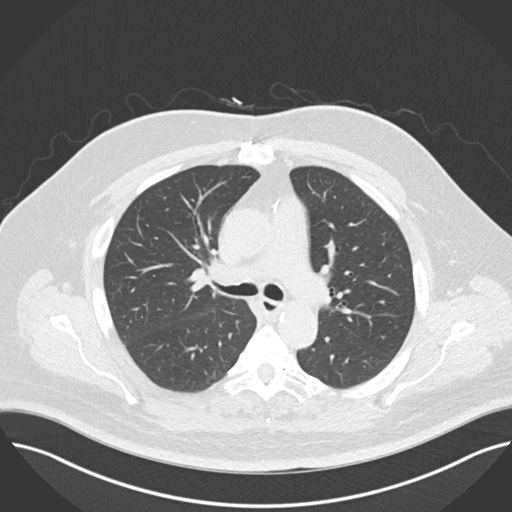
[im 117/152  lung]
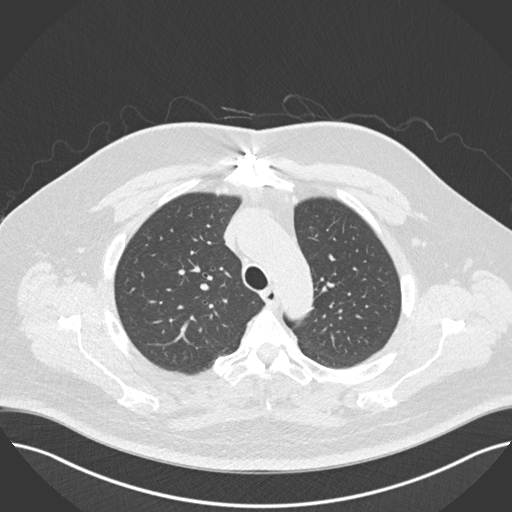
[im 131/152  lung]
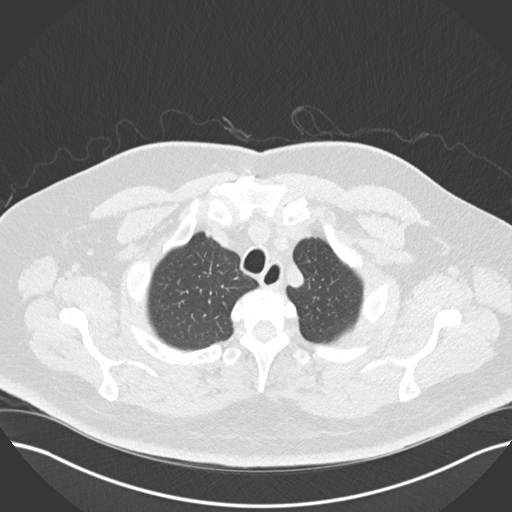
[im 145/152  lung]
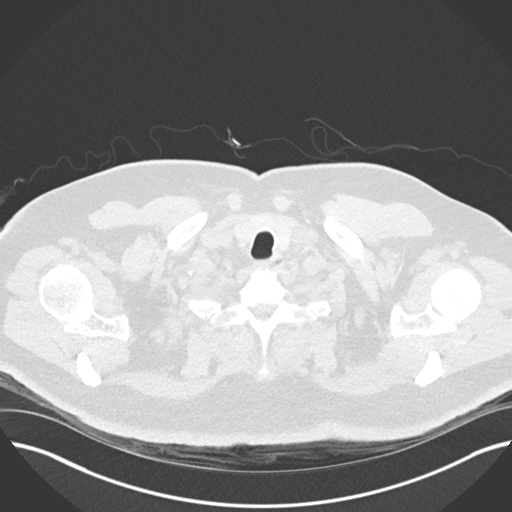

[Series 8: coronal · coronal · 0.59mm/px · 3 of 143 slices shown]
[im 29/143  lung]
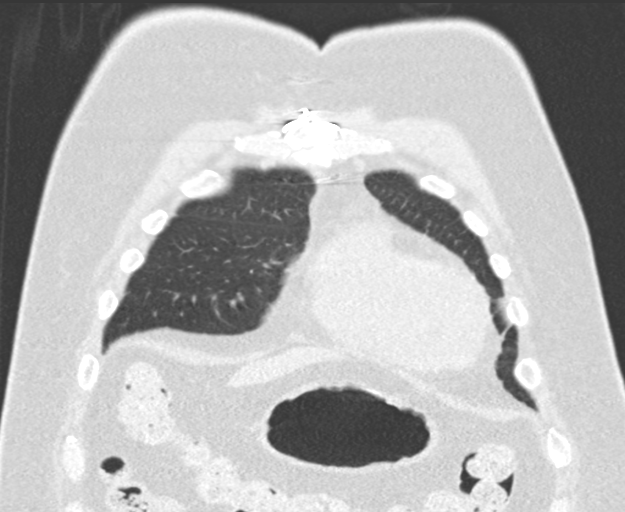
[im 57/143  lung]
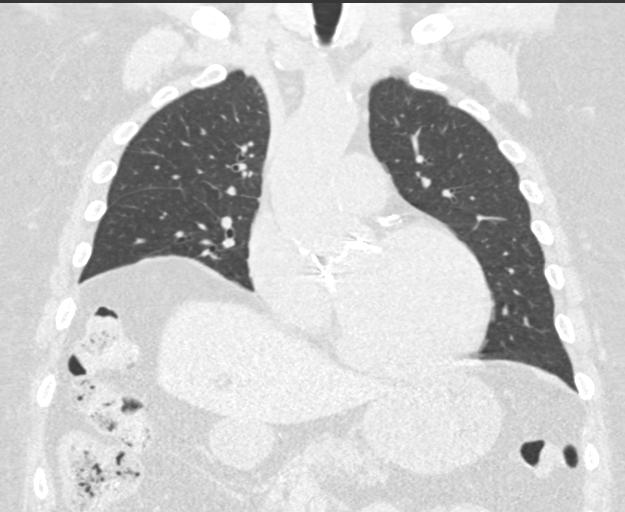
[im 86/143  lung]
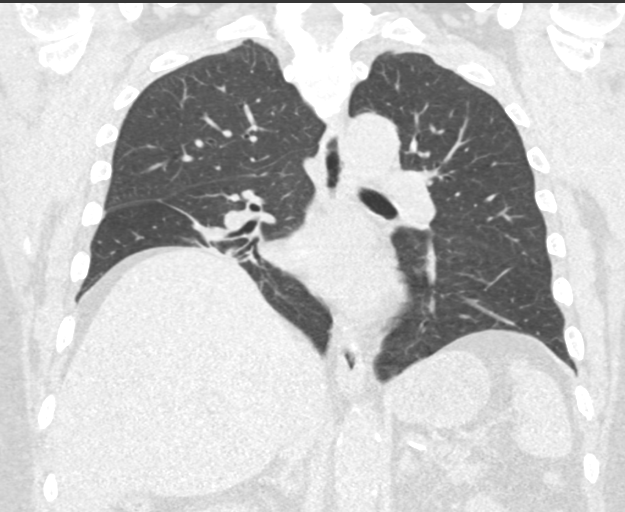

[15 of 36 positions shown; findings below may reference images not displayed]

FINDINGS: Cardiovascular: Normal heart size. No significant pericardial
fluid/thickening. Aortic valvular prosthesis is in place. Left
anterior descending, left circumflex and right coronary
atherosclerosis. Atherosclerotic nonaneurysmal thoracic aorta.
Normal caliber pulmonary arteries.

Mediastinum/Nodes: Dominant hypodense 1.4 cm right thyroid lobe
nodule near the isthmus. Unremarkable esophagus. No axillary
adenopathy. Cystic 1.7 x 1.3 cm high anterior mediastinal structure
(series 4/ image 39), increased mildly from 1.3 x 1.0 cm on
04/20/2015 and 1.0 x 0.9 cm on 08/04/2014. Otherwise no
pathologically enlarged mediastinal or gross hilar nodes on this
noncontrast scan.

Lungs/Pleura: No pneumothorax. No pleural effusion. No acute
consolidative airspace disease, lung masses or significant pulmonary
nodules. Mildly thickened parenchymal bands in both lower lobes,
stable on the right and new on the left, most compatible with mild
postinfectious/postinflammatory scarring. No significant air
trapping on the expiration sequence. No significant regions of
subpleural reticulation, ground-glass attenuation, traction
bronchiectasis, architectural distortion or frank honeycombing.

Upper abdomen: Cholelithiasis.

Musculoskeletal: No aggressive appearing focal osseous lesions.
Marked thoracic spondylosis. Intact sternotomy wires.
IMPRESSION: 1. No evidence of interstitial lung disease at this time .
2. Mild parenchymal banding at the lung bases, most compatible with
postinfectious/postinflammatory scarring.
3. Small 1.7 cm cystic structure in the high anterior mediastinum,
which has grown mildly on two consecutive chest CT studies back to
08/04/2014, raising consideration of a small cystic thymoma. Given
the small size, continued surveillance is warranted with chest CT
with IV contrast in 6 months. Thoracic surgical consultation may be
considered.
4. Three-vessel coronary atherosclerosis.
5. Cholelithiasis.

Aortic Atherosclerosis ([id number]).

## 2017-06-20 DIAGNOSIS — F329 Major depressive disorder, single episode, unspecified: Secondary | ICD-10-CM | POA: Diagnosis not present

## 2017-06-20 DIAGNOSIS — E785 Hyperlipidemia, unspecified: Secondary | ICD-10-CM | POA: Diagnosis not present

## 2017-06-20 DIAGNOSIS — I130 Hypertensive heart and chronic kidney disease with heart failure and stage 1 through stage 4 chronic kidney disease, or unspecified chronic kidney disease: Secondary | ICD-10-CM | POA: Diagnosis not present

## 2017-06-20 DIAGNOSIS — K219 Gastro-esophageal reflux disease without esophagitis: Secondary | ICD-10-CM | POA: Diagnosis not present

## 2017-06-20 DIAGNOSIS — I5042 Chronic combined systolic (congestive) and diastolic (congestive) heart failure: Secondary | ICD-10-CM | POA: Diagnosis not present

## 2017-06-20 DIAGNOSIS — N183 Chronic kidney disease, stage 3 (moderate): Secondary | ICD-10-CM | POA: Diagnosis not present

## 2017-06-20 DIAGNOSIS — I429 Cardiomyopathy, unspecified: Secondary | ICD-10-CM | POA: Diagnosis not present

## 2017-06-20 DIAGNOSIS — I480 Paroxysmal atrial fibrillation: Secondary | ICD-10-CM | POA: Diagnosis not present

## 2017-06-20 DIAGNOSIS — E1122 Type 2 diabetes mellitus with diabetic chronic kidney disease: Secondary | ICD-10-CM | POA: Diagnosis not present

## 2017-06-21 ENCOUNTER — Encounter: Payer: Medicare HMO | Admitting: Vascular Surgery

## 2017-06-24 DIAGNOSIS — N183 Chronic kidney disease, stage 3 (moderate): Secondary | ICD-10-CM | POA: Diagnosis not present

## 2017-06-24 DIAGNOSIS — K219 Gastro-esophageal reflux disease without esophagitis: Secondary | ICD-10-CM | POA: Diagnosis not present

## 2017-06-24 DIAGNOSIS — E1122 Type 2 diabetes mellitus with diabetic chronic kidney disease: Secondary | ICD-10-CM | POA: Diagnosis not present

## 2017-06-24 DIAGNOSIS — F329 Major depressive disorder, single episode, unspecified: Secondary | ICD-10-CM | POA: Diagnosis not present

## 2017-06-24 DIAGNOSIS — I130 Hypertensive heart and chronic kidney disease with heart failure and stage 1 through stage 4 chronic kidney disease, or unspecified chronic kidney disease: Secondary | ICD-10-CM | POA: Diagnosis not present

## 2017-06-24 DIAGNOSIS — I5042 Chronic combined systolic (congestive) and diastolic (congestive) heart failure: Secondary | ICD-10-CM | POA: Diagnosis not present

## 2017-06-24 DIAGNOSIS — E785 Hyperlipidemia, unspecified: Secondary | ICD-10-CM | POA: Diagnosis not present

## 2017-06-24 DIAGNOSIS — I480 Paroxysmal atrial fibrillation: Secondary | ICD-10-CM | POA: Diagnosis not present

## 2017-06-24 DIAGNOSIS — I429 Cardiomyopathy, unspecified: Secondary | ICD-10-CM | POA: Diagnosis not present

## 2017-06-25 DIAGNOSIS — F329 Major depressive disorder, single episode, unspecified: Secondary | ICD-10-CM | POA: Diagnosis not present

## 2017-06-25 DIAGNOSIS — I130 Hypertensive heart and chronic kidney disease with heart failure and stage 1 through stage 4 chronic kidney disease, or unspecified chronic kidney disease: Secondary | ICD-10-CM | POA: Diagnosis not present

## 2017-06-25 DIAGNOSIS — K219 Gastro-esophageal reflux disease without esophagitis: Secondary | ICD-10-CM | POA: Diagnosis not present

## 2017-06-25 DIAGNOSIS — I480 Paroxysmal atrial fibrillation: Secondary | ICD-10-CM | POA: Diagnosis not present

## 2017-06-25 DIAGNOSIS — E785 Hyperlipidemia, unspecified: Secondary | ICD-10-CM | POA: Diagnosis not present

## 2017-06-25 DIAGNOSIS — N183 Chronic kidney disease, stage 3 (moderate): Secondary | ICD-10-CM | POA: Diagnosis not present

## 2017-06-25 DIAGNOSIS — E1122 Type 2 diabetes mellitus with diabetic chronic kidney disease: Secondary | ICD-10-CM | POA: Diagnosis not present

## 2017-06-25 DIAGNOSIS — I429 Cardiomyopathy, unspecified: Secondary | ICD-10-CM | POA: Diagnosis not present

## 2017-06-25 DIAGNOSIS — I5042 Chronic combined systolic (congestive) and diastolic (congestive) heart failure: Secondary | ICD-10-CM | POA: Diagnosis not present

## 2017-07-01 ENCOUNTER — Ambulatory Visit (INDEPENDENT_AMBULATORY_CARE_PROVIDER_SITE_OTHER): Payer: Medicare HMO | Admitting: Orthopedic Surgery

## 2017-07-01 DIAGNOSIS — Z89511 Acquired absence of right leg below knee: Secondary | ICD-10-CM | POA: Diagnosis not present

## 2017-07-01 DIAGNOSIS — M7541 Impingement syndrome of right shoulder: Secondary | ICD-10-CM | POA: Diagnosis not present

## 2017-07-01 NOTE — Progress Notes (Signed)
Office Visit Note   Patient: Thomas Wright           Date of Birth: 10-30-1945           MRN: [id number] Visit Date: 07/01/2017              Requested by: Leighton Ruff, MD [place], [place] [id number] PCP: Leighton Ruff, MD  Chief Complaint  Patient presents with  . Right Leg - Pain      HPI: Patient presents for 2 separate issues #1 impingement syndrome right shoulder #2 right transtibial amputation. Patient states that he still has symptoms in the right shoulder he has undergone a cardiac workup and he states that he is cleared from a cardiology standpoint to proceed with right shoulder arthroscopy. Patient has lost significant volume in the right lower extremity he states he is currently wearing 16 ply sock  and does not have rotational stability with this prosthetic leg.  Assessment & Plan: Visit Diagnoses:  1. Impingement syndrome of right shoulder   2. S/P unilateral BKA (below knee amputation), right ([place])     Plan: patient was given a prescription for [employer] for a new socket new liner new socks. Patient states that he would like to schedule right shoulder arthroscopy for debridement of the rotator cuff pathology. He will call to set this up at his convenience. Would plan for outpatient surgery at [facility].  Follow-Up Instructions: Return if symptoms worsen or fail to improve.   Ortho Exam  Patient is alert, oriented, no adenopathy, well-dressed, normal affect, normal respiratory effort. Examination right shoulder patient has pain with Neer and Hawkins impingement test pain with drop arm test. Examination of his right lower extremity he does not have rotational stability with ambulation the prosthesis is loose he has loss of residual volume there were no ulcers no cellulitis no skin breakdown.  Imaging: No results found. No images are attached to the encounter.  Labs: Lab Results  Component Value Date   HGBA1C 8.4 (H) 05/15/2017   HGBA1C 12.6 (H) 01/09/2016   HGBA1C 11.0 06/20/2015   ESRSEDRATE 80 (H) 01/10/2016   ESRSEDRATE 62 (H) 01/09/2016   ESRSEDRATE 11 05/10/2015   CRP 24.2 (H) 01/10/2016   CRP 24.8 (H) 01/09/2016   CRP 0.1 (L) 05/10/2015   REPTSTATUS 05/20/2017 FINAL 05/15/2017   GRAMSTAIN  07/29/2014    MODERATE WBC PRESENT,BOTH PMN AND MONONUCLEAR NO SQUAMOUS EPITHELIAL CELLS SEEN FEW GRAM POSITIVE COCCI IN PAIRS RARE GRAM NEGATIVE RODS Performed at [facility]  07/29/2014    MODERATE WBC PRESENT,BOTH PMN AND MONONUCLEAR NO SQUAMOUS EPITHELIAL CELLS SEEN FEW GRAM POSITIVE COCCI IN PAIRS RARE GRAM NEGATIVE RODS Performed at [facility]    CULT NO GROWTH 5 DAYS 05/15/2017   [place] STAPHYLOCOCCUS AUREUS 07/29/2014    Orders:  No orders of the defined types were placed in this encounter.  No orders of the defined types were placed in this encounter.    Procedures: No procedures performed  Clinical Data: No additional findings.  ROS:  All other systems negative, except as noted in the HPI. Review of Systems  Objective: Vital Signs: There were no vitals taken for this visit.  Specialty Comments:  No specialty comments available.  PMFS History: Patient Active Problem List   Diagnosis Date Noted  . Encounter for colonoscopy due to history of adenomatous colonic polyps 05/29/2017  . Intractable nausea and vomiting 05/14/2017  . Medication reaction 05/14/2017  .  Metabolic acidosis 07/14/2535  . CKD (chronic kidney disease), stage III ([place]) 05/14/2017  . Abdominal pain   . Emesis   . Bilateral carotid artery disease ([place]) 04/01/2017  . Diabetic polyneuropathy associated with type 2 diabetes mellitus ([place]) 02/14/2017  . Impingement syndrome of right shoulder 02/14/2017  . Fluid retention   . Labile blood glucose   . Toe erythema   . Hypoglycemia associated with type 2 diabetes mellitus ([place])   . Hyponatremia   . Diarrhea 01/17/2016  . Amputation  of right lower extremity below knee with complication ([place]) 64/40/3474  . AKI (acute kidney injury) ([place])   . Type 2 diabetes mellitus with peripheral neuropathy (HCC)   . Status post aortic valve replacement   . Chronic combined systolic and diastolic congestive heart failure ([place])   . ATN (acute tubular necrosis) ([place])   . Postoperative pain   . Acute blood loss anemia   . Leukocytosis   . Morbid obesity ([place])   . S/P unilateral BKA (below knee amputation), right ([place])   . Diabetes mellitus with complication ([place]) 25/95/6387  . Diabetic foot infection ([place]) 01/09/2016  . Sepsis, unspecified organism ([place]) 01/09/2016  . PAF (paroxysmal atrial fibrillation) ([place]) 04/22/2015  . Essential hypertension 04/22/2015  . Obesity 04/22/2015  . Dyspnea 04/20/2015  . Acute on chronic diastolic CHF (congestive heart failure) ([place]) 04/19/2015  . Cellulitis of leg 11/23/2014  . Diabetes ([place]) 09/13/2014  . Chronic anticoagulation 09/13/2014  . Atrial flutter, unspecified   . Paroxysmal atrial fibrillation (HCC)   . Chest pain   . Perirectal abscess   . Other hemorrhoids   . Tenosynovitis of finger 05/05/2014  . Cellulitis and abscess of digit 05/05/2014  . Back pain   . H/O aortic valve replacement with tissue graft   . HLD (hyperlipidemia)   . Hypertension    Past Medical History:  Diagnosis Date  . Arthritis    lower back  . Cardiomyopathy ([place])    EF originally 20%-now 45%, no CAD on cath  . Chronic combined systolic and diastolic CHF (congestive heart failure) ([place])    a. 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 Reported h/o cath in 2008 without significant CAD.  Marland Kitchen Hyperlipidemia few yrs ago  . Hypertension   . Hypertriglyceridemia   . PAF (paroxysmal atrial fibrillation) ([place])    a. Postop 2008. b. recurrent afib 08/2014 with LAA clot noted on TEE. EF was down again at 25%. He was anticoagulated with Eliquis  and placed on Amiodarone.He ultimately underwent TEE-DCCV 08/2014.  . S/P aortic valve replacement with bioprosthetic valve    a. bioprosthetic AVR in 05/2007 in [place].bovine valve  . Type II diabetes mellitus (HCC)     Family History  Problem Relation Age of Onset  . Colon cancer Father   . Diabetes Father   . Liver disease Brother   . Heart murmur Child   . Congenital heart disease Other   . Diabetes Maternal Grandmother   . Heart disease Maternal Grandfather        A Fib    Past Surgical History:  Procedure Laterality Date  . AMPUTATION Right 07/29/2014   Procedure: AMPUTATION RIGHT LONG FINGER;  Surgeon: [name], MD;  Location: [facility];  Service: Orthopedics;  Laterality: Right;  . AMPUTATION Right 01/13/2016   Procedure: RIGHT BELOW KNEE AMPUTATION;  Surgeon: Newt Minion, MD;  Location: [facility] [place];  Service: Orthopedics;  Laterality: Right;  . AORTIC VALVE REPLACEMENT  05/2007   with tissue graft  . APPLICATION OF WOUND VAC Right 01/13/2016   Procedure: APPLICATION OF WOUND VAC;  Surgeon: Newt Minion, MD;  Location: [place];  Service: Orthopedics;  Laterality: Right;  . CARDIAC CATHETERIZATION  "several"  . CARDIAC VALVE REPLACEMENT    . CARDIOVERSION N/A 07/23/2014   Procedure: CARDIOVERSION;  Surgeon: Josue Hector, MD;  Location: [place];  Service: Cardiovascular;  Laterality: N/A;  . CARDIOVERSION N/A 09/01/2014   Procedure: CARDIOVERSION;  Surgeon: [name], MD;  Location: [facility];  Service: Cardiovascular;  Laterality: N/A;  . COLONOSCOPY WITH PROPOFOL N/A 05/29/2017   Procedure: COLONOSCOPY WITH PROPOFOL;  Surgeon: Wilford Corner, MD;  Location: [facility];  Service: Endoscopy;  Laterality: N/A;  . I&D EXTREMITY Right 05/05/2014   Procedure: IRRIGATION AND DEBRIDEMENT EXTREMITY;  Surgeon: [name], MD;  Location: [place];  Service: Orthopedics;  Laterality: Right;  . LEFT HEART CATH AND CORONARY ANGIOGRAPHY N/A 05/17/2017   Procedure: LEFT  HEART CATH AND CORONARY ANGIOGRAPHY;  Surgeon: Martinique, Peter M, MD;  Location: [facility];  Service: Cardiovascular;  Laterality: N/A;  . TEE WITHOUT CARDIOVERSION N/A 07/23/2014   Procedure: TRANSESOPHAGEAL ECHOCARDIOGRAM (TEE);  Surgeon: Josue Hector, MD;  Location: [facility];  Service: Cardiovascular;  Laterality: N/A;  . TEE WITHOUT CARDIOVERSION N/A 09/01/2014   Procedure: TRANSESOPHAGEAL ECHOCARDIOGRAM (TEE);  Surgeon: [name], MD;  Location: [facility] ENDOSCOPY;  Service: Cardiovascular;  Laterality: N/A;  . TEE WITHOUT CARDIOVERSION N/A 04/17/2017   Procedure: TRANSESOPHAGEAL ECHOCARDIOGRAM (TEE);  Surgeon: Jerline Pain, MD;  Location: [facility] ENDOSCOPY;  Service: Cardiovascular;  Laterality: N/A;  . TONSILLECTOMY  1954   Social History   Occupational History  . retired     Social History Main Topics  . Smoking status: Never Smoker  . Smokeless tobacco: Never Used  . Alcohol use 0.0 oz/week     Comment: 04/19/2015 "might have a beer or 2, 2-3 times/yr"  . Drug use: No  . Sexual activity: No

## 2017-07-02 DIAGNOSIS — I130 Hypertensive heart and chronic kidney disease with heart failure and stage 1 through stage 4 chronic kidney disease, or unspecified chronic kidney disease: Secondary | ICD-10-CM | POA: Diagnosis not present

## 2017-07-02 DIAGNOSIS — E785 Hyperlipidemia, unspecified: Secondary | ICD-10-CM | POA: Diagnosis not present

## 2017-07-02 DIAGNOSIS — I429 Cardiomyopathy, unspecified: Secondary | ICD-10-CM | POA: Diagnosis not present

## 2017-07-02 DIAGNOSIS — I480 Paroxysmal atrial fibrillation: Secondary | ICD-10-CM | POA: Diagnosis not present

## 2017-07-02 DIAGNOSIS — N183 Chronic kidney disease, stage 3 (moderate): Secondary | ICD-10-CM | POA: Diagnosis not present

## 2017-07-02 DIAGNOSIS — I5042 Chronic combined systolic (congestive) and diastolic (congestive) heart failure: Secondary | ICD-10-CM | POA: Diagnosis not present

## 2017-07-02 DIAGNOSIS — F329 Major depressive disorder, single episode, unspecified: Secondary | ICD-10-CM | POA: Diagnosis not present

## 2017-07-02 DIAGNOSIS — K219 Gastro-esophageal reflux disease without esophagitis: Secondary | ICD-10-CM | POA: Diagnosis not present

## 2017-07-02 DIAGNOSIS — E1122 Type 2 diabetes mellitus with diabetic chronic kidney disease: Secondary | ICD-10-CM | POA: Diagnosis not present

## 2017-07-08 DIAGNOSIS — K219 Gastro-esophageal reflux disease without esophagitis: Secondary | ICD-10-CM | POA: Diagnosis not present

## 2017-07-08 DIAGNOSIS — I429 Cardiomyopathy, unspecified: Secondary | ICD-10-CM | POA: Diagnosis not present

## 2017-07-08 DIAGNOSIS — E1122 Type 2 diabetes mellitus with diabetic chronic kidney disease: Secondary | ICD-10-CM | POA: Diagnosis not present

## 2017-07-08 DIAGNOSIS — N183 Chronic kidney disease, stage 3 (moderate): Secondary | ICD-10-CM | POA: Diagnosis not present

## 2017-07-08 DIAGNOSIS — F329 Major depressive disorder, single episode, unspecified: Secondary | ICD-10-CM | POA: Diagnosis not present

## 2017-07-08 DIAGNOSIS — E785 Hyperlipidemia, unspecified: Secondary | ICD-10-CM | POA: Diagnosis not present

## 2017-07-08 DIAGNOSIS — I480 Paroxysmal atrial fibrillation: Secondary | ICD-10-CM | POA: Diagnosis not present

## 2017-07-08 DIAGNOSIS — I130 Hypertensive heart and chronic kidney disease with heart failure and stage 1 through stage 4 chronic kidney disease, or unspecified chronic kidney disease: Secondary | ICD-10-CM | POA: Diagnosis not present

## 2017-07-08 DIAGNOSIS — I5042 Chronic combined systolic (congestive) and diastolic (congestive) heart failure: Secondary | ICD-10-CM | POA: Diagnosis not present

## 2017-07-19 ENCOUNTER — Telehealth (INDEPENDENT_AMBULATORY_CARE_PROVIDER_SITE_OTHER): Payer: Self-pay | Admitting: Orthopedic Surgery

## 2017-07-19 ENCOUNTER — Other Ambulatory Visit: Payer: Self-pay

## 2017-07-19 DIAGNOSIS — R0602 Shortness of breath: Secondary | ICD-10-CM

## 2017-07-19 NOTE — Telephone Encounter (Signed)
07/01/2017 OV NOTE FAXED [employer] [phone]

## 2017-07-23 DIAGNOSIS — E785 Hyperlipidemia, unspecified: Secondary | ICD-10-CM | POA: Diagnosis not present

## 2017-07-23 DIAGNOSIS — I480 Paroxysmal atrial fibrillation: Secondary | ICD-10-CM | POA: Diagnosis not present

## 2017-07-23 DIAGNOSIS — I130 Hypertensive heart and chronic kidney disease with heart failure and stage 1 through stage 4 chronic kidney disease, or unspecified chronic kidney disease: Secondary | ICD-10-CM | POA: Diagnosis not present

## 2017-07-23 DIAGNOSIS — F329 Major depressive disorder, single episode, unspecified: Secondary | ICD-10-CM | POA: Diagnosis not present

## 2017-07-23 DIAGNOSIS — N183 Chronic kidney disease, stage 3 (moderate): Secondary | ICD-10-CM | POA: Diagnosis not present

## 2017-07-23 DIAGNOSIS — I429 Cardiomyopathy, unspecified: Secondary | ICD-10-CM | POA: Diagnosis not present

## 2017-07-23 DIAGNOSIS — K219 Gastro-esophageal reflux disease without esophagitis: Secondary | ICD-10-CM | POA: Diagnosis not present

## 2017-07-23 DIAGNOSIS — I5042 Chronic combined systolic (congestive) and diastolic (congestive) heart failure: Secondary | ICD-10-CM | POA: Diagnosis not present

## 2017-07-23 DIAGNOSIS — E1122 Type 2 diabetes mellitus with diabetic chronic kidney disease: Secondary | ICD-10-CM | POA: Diagnosis not present

## 2017-07-26 ENCOUNTER — Encounter: Payer: Medicare HMO | Admitting: Vascular Surgery

## 2017-08-07 DIAGNOSIS — H524 Presbyopia: Secondary | ICD-10-CM | POA: Diagnosis not present

## 2017-08-07 DIAGNOSIS — E113493 Type 2 diabetes mellitus with severe nonproliferative diabetic retinopathy without macular edema, bilateral: Secondary | ICD-10-CM | POA: Diagnosis not present

## 2017-08-07 DIAGNOSIS — H2513 Age-related nuclear cataract, bilateral: Secondary | ICD-10-CM | POA: Diagnosis not present

## 2017-08-07 DIAGNOSIS — H52201 Unspecified astigmatism, right eye: Secondary | ICD-10-CM | POA: Diagnosis not present

## 2017-08-07 DIAGNOSIS — H5203 Hypermetropia, bilateral: Secondary | ICD-10-CM | POA: Diagnosis not present

## 2017-08-22 ENCOUNTER — Other Ambulatory Visit: Payer: Self-pay | Admitting: Cardiology

## 2017-08-22 MED ORDER — AMIODARONE HCL 100 MG PO TABS: 100.0000 mg | ORAL_TABLET | Freq: Every day | ORAL | 2 refills | Status: DC

## 2017-08-23 ENCOUNTER — Encounter: Payer: Medicare HMO | Admitting: Vascular Surgery

## 2017-09-03 DIAGNOSIS — Z89511 Acquired absence of right leg below knee: Secondary | ICD-10-CM | POA: Diagnosis not present

## 2017-09-09 ENCOUNTER — Inpatient Hospital Stay: Admission: RE | Admit: 2017-09-09 | Payer: Medicare HMO | Source: Ambulatory Visit

## 2017-09-24 ENCOUNTER — Telehealth: Payer: Self-pay | Admitting: Pulmonary Disease

## 2017-09-24 DIAGNOSIS — D4989 Neoplasm of unspecified behavior of other specified sites: Secondary | ICD-10-CM

## 2017-09-24 DIAGNOSIS — D150 Benign neoplasm of thymus: Principal | ICD-10-CM

## 2017-09-24 NOTE — Telephone Encounter (Signed)
Received phone call from [facility] Pt has CT Chest with contrast scheduled for tomorrow 1.9.19 BMET needs to be changed to STAT  Done

## 2017-09-25 ENCOUNTER — Inpatient Hospital Stay: Admission: RE | Admit: 2017-09-25 | Payer: Medicare HMO | Source: Ambulatory Visit

## 2017-09-27 ENCOUNTER — Encounter: Payer: Self-pay | Admitting: Vascular Surgery

## 2017-09-27 ENCOUNTER — Ambulatory Visit (INDEPENDENT_AMBULATORY_CARE_PROVIDER_SITE_OTHER): Payer: Medicare HMO | Admitting: Vascular Surgery

## 2017-09-27 ENCOUNTER — Other Ambulatory Visit: Payer: Self-pay

## 2017-09-27 VITALS — BP 119/67 | HR 71 | Temp 98.4°F | Resp 18 | Ht 73.0 in | Wt 250.0 lb

## 2017-09-27 DIAGNOSIS — I6523 Occlusion and stenosis of bilateral carotid arteries: Secondary | ICD-10-CM

## 2017-09-27 NOTE — Progress Notes (Signed)
Patient ID: Thomas Wright, male   DOB: 11/11/1945, 72 y.o.   MRN: [id number]  Reason for Consult: New Patient (Initial Visit) (carotid stenosis)   Referred by Leighton Ruff, MD  Subjective:     HPI:  Thomas Wright is a 72 y.o. male with a history of aortic valve replacement and cardiomyopathy.  He also has a history of right below-knee amputation for infection as well as diabetes.  He takes Eliquis for atrial fibrillation as well as aspirin and statin drug.  He has had a silent myocardial infarction in the past but denies any history of stroke TIA or amaurosis.  He is sent here for moderate carotid artery stenosis and is also had ABIs checked in the recent past.  He is not having any new issues related to today's visit.  He does have intermittent left lateral leg ulceration that he thinks and initiated from minor trauma.  Past Medical History:  Diagnosis Date  . Arthritis    lower back  . Cardiomyopathy ([place])    EF originally 20%-now 45%, no CAD on cath  . Chronic combined systolic and diastolic CHF (congestive heart failure) ([place])    a. 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 Reported h/o cath in 2008 without significant CAD.  Marland Kitchen Hyperlipidemia few yrs ago  . Hypertension   . Hypertriglyceridemia   . PAF (paroxysmal atrial fibrillation) ([place])    a. Postop 2008. b. recurrent afib 08/2014 with LAA clot noted on TEE. EF was down again at 25%. He was anticoagulated with Eliquis and placed on Amiodarone.He ultimately underwent TEE-DCCV 08/2014.  . S/P aortic valve replacement with bioprosthetic valve    a. bioprosthetic AVR in 05/2007 in [place].bovine valve  . Type II diabetes mellitus (HCC)    Family History  Problem Relation Age of Onset  . Colon cancer Father   . Diabetes Father   . Liver disease Brother   . Heart murmur Child   . Congenital heart disease Other   . Diabetes Maternal Grandmother   .  Heart disease Maternal Grandfather        A Fib   Past Surgical History:  Procedure Laterality Date  . AMPUTATION Right 07/29/2014   Procedure: AMPUTATION RIGHT LONG FINGER;  Surgeon: [name], MD;  Location: [place];  Service: Orthopedics;  Laterality: Right;  . AMPUTATION Right 01/13/2016   Procedure: RIGHT BELOW KNEE AMPUTATION;  Surgeon: Newt Minion, MD;  Location: [facility];  Service: Orthopedics;  Laterality: Right;  . AORTIC VALVE REPLACEMENT  05/2007   with tissue graft  . APPLICATION OF WOUND VAC Right 01/13/2016   Procedure: APPLICATION OF WOUND VAC;  Surgeon: Newt Minion, MD;  Location: [employer];  Service: Orthopedics;  Laterality: Right;  . CARDIAC CATHETERIZATION  "several"  . CARDIAC VALVE REPLACEMENT    . CARDIOVERSION N/A 07/23/2014   Procedure: CARDIOVERSION;  Surgeon: Josue Hector, MD;  Location: [place];  Service: Cardiovascular;  Laterality: N/A;  . CARDIOVERSION N/A 09/01/2014   Procedure: CARDIOVERSION;  Surgeon: [name], MD;  Location: [place];  Service: Cardiovascular;  Laterality: N/A;  . COLONOSCOPY WITH PROPOFOL N/A 05/29/2017   Procedure: COLONOSCOPY WITH PROPOFOL;  Surgeon: Wilford Corner, MD;  [facility];  Service: Endoscopy;  Laterality: N/A;  . I&D EXTREMITY Right 05/05/2014   Procedure: IRRIGATION AND DEBRIDEMENT EXTREMITY;  Surgeon: [name], MD;  Location: [place];  Service: Orthopedics;  Laterality: Right;  . LEFT HEART CATH AND CORONARY ANGIOGRAPHY N/A 05/17/2017   Procedure: LEFT HEART CATH AND CORONARY ANGIOGRAPHY;  Surgeon: Martinique, Peter M, MD;  Location: [place] [facility];  Service: Cardiovascular;  Laterality: N/A;  . TEE WITHOUT CARDIOVERSION N/A 07/23/2014   Procedure: TRANSESOPHAGEAL ECHOCARDIOGRAM (TEE);  Surgeon: Josue Hector, MD;  Location: [place];  Service: Cardiovascular;  Laterality: N/A;  . TEE WITHOUT CARDIOVERSION N/A 09/01/2014   Procedure: TRANSESOPHAGEAL ECHOCARDIOGRAM (TEE);  Surgeon: [name], MD;  Location: [facility] ENDOSCOPY;  Service: Cardiovascular;  Laterality: N/A;  . TEE WITHOUT CARDIOVERSION N/A 04/17/2017   Procedure: TRANSESOPHAGEAL ECHOCARDIOGRAM (TEE);  Surgeon: Jerline Pain, MD;  Location: [facility] ENDOSCOPY;  Service: Cardiovascular;  Laterality: N/A;  . TONSILLECTOMY  1954    Short Social History:  Social History   Tobacco Use  . Smoking status: Never Smoker  . Smokeless tobacco: Never Used  Substance Use Topics  . Alcohol use: Yes    Alcohol/week: 0.0 oz    Comment: 04/19/2015 "might have a beer or 2, 2-3 times/yr"    Allergies  Allergen Reactions  . Amoxicill-Clarithro-Omeprazole Diarrhea and Nausea And Vomiting  . Dulaglutide Nausea And Vomiting    Current Outpatient Medications  Medication Sig Dispense Refill  . amiodarone (PACERONE) 100 MG tablet Take 1 tablet (100 mg total) by mouth daily. 90 tablet 2  . apixaban (ELIQUIS) 5 MG TABS tablet Take 1 tablet (5 mg total) by mouth 2 (two) times daily. 180 tablet 2  . atorvastatin (LIPITOR) 10 MG tablet Take 1 tablet (10 mg total) by mouth every morning. 90 tablet 2  . carvedilol (COREG) 6.25 MG tablet Take 6.25 mg by mouth 2 (two) times daily with a meal.    . furosemide (LASIX) 40 MG tablet Take 1 tablet (40 mg total) by mouth daily. 30 tablet 1  . insulin regular (NOVOLIN R) 100 units/mL injection Inject 0.03 mLs (3 Units total) into the skin 3 (three) times daily before meals. (Patient taking differently: Inject 3-8 Units into the skin See admin instructions. Inject 3 units SQ before breakfast and lunch, inject 8 units SQ before supper) 20 mL 3  . Multiple Vitamin (MULTIVITAMIN) tablet Take 1 tablet by mouth daily.    Nelva Nay SOLOSTAR 300 UNIT/ML SOPN Inject 15 Units into the skin daily. (Patient taking differently: Inject 28 Units into the skin at bedtime. ) 5 pen 0  . ondansetron (ZOFRAN ODT) 4 MG disintegrating tablet Take 1 tablet (4 mg total) by mouth every 8 (eight) hours as needed for nausea or  vomiting. (Patient not taking: Reported on 09/27/2017) 30 tablet 0  . pantoprazole (PROTONIX) 40 MG tablet Take twice a day for 2 weeks until your GI appointment, then daily (Patient not taking: Reported on 09/27/2017) 60 tablet 1  . senna-docusate (SENOKOT-S) 8.6-50 MG tablet Take 2 tablets by mouth at bedtime as needed for mild constipation. (Patient not taking: Reported on 09/27/2017) 30 tablet 0  . sucralfate (CARAFATE) 1 GM/10ML suspension Take 10 mLs (1 g total) by mouth 2 (two) times daily. (Patient not taking: Reported on 09/27/2017) 420 mL 0   No current facility-administered medications for this visit.     Review of Systems  Constitutional:  Constitutional negative. HENT: HENT negative.  Eyes: Eyes negative.  Cardiovascular: Positive for leg swelling.  GI: Gastrointestinal negative.  Musculoskeletal: Musculoskeletal negative.  Skin: Positive for wound.  Neurological: Neurological negative. Hematologic: Hematologic/lymphatic negative.  Psychiatric: Psychiatric negative.  Objective:  Objective   Vitals:   09/27/17 1048 09/27/17 1052  BP: 128/65 119/67  Pulse: 72 71  Resp: 18   Temp: 98.4 F (36.9 C)   TempSrc: Oral   SpO2: 98%   Weight: 250 lb (113.4 kg)   Height: 6\' 1"  (1.854 m)    Body mass index is 32.98 kg/m.  Physical Exam  Constitutional: He is oriented to person, place, and time. He appears well-developed.  HENT:  Head: Normocephalic.  Eyes: Pupils are equal, round, and reactive to light.  Neck: Normal range of motion.  Cardiovascular: Normal rate.  Pulses:      Radial pulses are 2+ on the right side, and 2+ on the left side.       Popliteal pulses are 2+ on the left side.  Abdominal: Soft. He exhibits no mass.  Musculoskeletal: Normal range of motion. He exhibits no edema.  Neurological: He is alert and oriented to person, place, and time.  Skin: Skin is warm and dry.  Left lateral leg 2cm ulcer mid calf  Psychiatric: He has a normal mood and  affect. His behavior is normal. Judgment normal.    Data: I reviewed his carotid duplex studies which demonstrate a right ICA peak systolic velocity 767 and a left side of 154 bilateral less than 50% stenosis.  Previous ABIs in the left for greater than 1 and triphasic.\     Assessment/Plan:     72 year old male presents for evaluation of carotid stenosis which is less than 50% bilaterally.  He has previous normal ABI on the left although he does have a lateral leg ulceration does not appear infected.  He is on blood thinners as well as aspirin and statin.  He does not have any history of symptomatic carotids.  He does have aortic atherosclerosis by CT scan but palpable popliteal pulse on the left.  With this think it is prudent that we see him in 2 years with repeat carotid duplex.  Should he have issues before then we will certainly see him sooner.  I have discussed with him the signs and symptoms of stroke TIA and he demonstrates good understanding.    Waynetta Sandy MD [facility]

## 2017-09-27 NOTE — Progress Notes (Signed)
Vitals:   09/27/17 1048  BP: 128/65  Pulse: 72  Resp: 18  Temp: 98.4 F (36.9 C)  TempSrc: Oral  SpO2: 98%  Weight: 250 lb (113.4 kg)  Height: 6\' 1"  (1.854 m)

## 2017-09-30 DIAGNOSIS — E1165 Type 2 diabetes mellitus with hyperglycemia: Secondary | ICD-10-CM | POA: Diagnosis not present

## 2017-09-30 DIAGNOSIS — Z89511 Acquired absence of right leg below knee: Secondary | ICD-10-CM | POA: Diagnosis not present

## 2017-09-30 DIAGNOSIS — Z23 Encounter for immunization: Secondary | ICD-10-CM | POA: Diagnosis not present

## 2017-09-30 DIAGNOSIS — Z794 Long term (current) use of insulin: Secondary | ICD-10-CM | POA: Diagnosis not present

## 2017-09-30 DIAGNOSIS — R2681 Unsteadiness on feet: Secondary | ICD-10-CM | POA: Diagnosis not present

## 2017-10-01 ENCOUNTER — Other Ambulatory Visit (INDEPENDENT_AMBULATORY_CARE_PROVIDER_SITE_OTHER): Payer: Medicare HMO

## 2017-10-01 DIAGNOSIS — E1165 Type 2 diabetes mellitus with hyperglycemia: Secondary | ICD-10-CM | POA: Diagnosis not present

## 2017-10-01 DIAGNOSIS — D150 Benign neoplasm of thymus: Secondary | ICD-10-CM | POA: Diagnosis not present

## 2017-10-01 DIAGNOSIS — E782 Mixed hyperlipidemia: Secondary | ICD-10-CM | POA: Diagnosis not present

## 2017-10-01 DIAGNOSIS — Z794 Long term (current) use of insulin: Secondary | ICD-10-CM | POA: Diagnosis not present

## 2017-10-01 DIAGNOSIS — D4989 Neoplasm of unspecified behavior of other specified sites: Secondary | ICD-10-CM

## 2017-10-01 LAB — BASIC METABOLIC PANEL
BUN: 22 mg/dL (ref 6–23)
CHLORIDE: 101 meq/L (ref 96–112)
CO2: 28 meq/L (ref 19–32)
CREATININE: 1.3 mg/dL (ref 0.40–1.50)
Calcium: 9.6 mg/dL (ref 8.4–10.5)
GFR: 58.01 mL/min — ABNORMAL LOW (ref >60.00)
Glucose, Bld: 219 mg/dL — ABNORMAL HIGH (ref 70–99)
POTASSIUM: 5 meq/L (ref 3.5–5.1)
Sodium: 137 mEq/L (ref 135–145)

## 2017-10-02 DIAGNOSIS — I10 Essential (primary) hypertension: Secondary | ICD-10-CM | POA: Diagnosis not present

## 2017-10-02 DIAGNOSIS — M4800 Spinal stenosis, site unspecified: Secondary | ICD-10-CM | POA: Diagnosis not present

## 2017-10-02 DIAGNOSIS — I429 Cardiomyopathy, unspecified: Secondary | ICD-10-CM | POA: Diagnosis not present

## 2017-10-02 DIAGNOSIS — E119 Type 2 diabetes mellitus without complications: Secondary | ICD-10-CM | POA: Diagnosis not present

## 2017-10-02 DIAGNOSIS — R296 Repeated falls: Secondary | ICD-10-CM | POA: Diagnosis not present

## 2017-10-02 DIAGNOSIS — I482 Chronic atrial fibrillation: Secondary | ICD-10-CM | POA: Diagnosis not present

## 2017-10-02 DIAGNOSIS — R2689 Other abnormalities of gait and mobility: Secondary | ICD-10-CM | POA: Diagnosis not present

## 2017-10-02 DIAGNOSIS — F329 Major depressive disorder, single episode, unspecified: Secondary | ICD-10-CM | POA: Diagnosis not present

## 2017-10-02 DIAGNOSIS — E785 Hyperlipidemia, unspecified: Secondary | ICD-10-CM | POA: Diagnosis not present

## 2017-10-03 ENCOUNTER — Ambulatory Visit (INDEPENDENT_AMBULATORY_CARE_PROVIDER_SITE_OTHER)
Admission: RE | Admit: 2017-10-03 | Discharge: 2017-10-03 | Disposition: A | Discharge status: Home / Self Care | Payer: Medicare HMO | Source: Ambulatory Visit | Attending: Pulmonary Disease | Admitting: Pulmonary Disease

## 2017-10-03 DIAGNOSIS — E669 Obesity, unspecified: Secondary | ICD-10-CM | POA: Diagnosis not present

## 2017-10-03 DIAGNOSIS — D150 Benign neoplasm of thymus: Secondary | ICD-10-CM

## 2017-10-03 DIAGNOSIS — R918 Other nonspecific abnormal finding of lung field: Secondary | ICD-10-CM

## 2017-10-03 DIAGNOSIS — D4989 Neoplasm of unspecified behavior of other specified sites: Secondary | ICD-10-CM

## 2017-10-03 DIAGNOSIS — E782 Mixed hyperlipidemia: Secondary | ICD-10-CM | POA: Diagnosis not present

## 2017-10-03 DIAGNOSIS — Z89511 Acquired absence of right leg below knee: Secondary | ICD-10-CM | POA: Diagnosis not present

## 2017-10-03 DIAGNOSIS — E1165 Type 2 diabetes mellitus with hyperglycemia: Secondary | ICD-10-CM | POA: Diagnosis not present

## 2017-10-03 DIAGNOSIS — Z794 Long term (current) use of insulin: Secondary | ICD-10-CM | POA: Diagnosis not present

## 2017-10-03 DIAGNOSIS — R809 Proteinuria, unspecified: Secondary | ICD-10-CM | POA: Diagnosis not present

## 2017-10-03 DIAGNOSIS — E559 Vitamin D deficiency, unspecified: Secondary | ICD-10-CM | POA: Diagnosis not present

## 2017-10-03 DIAGNOSIS — I10 Essential (primary) hypertension: Secondary | ICD-10-CM | POA: Diagnosis not present

## 2017-10-03 DIAGNOSIS — Z6833 Body mass index (BMI) 33.0-33.9, adult: Secondary | ICD-10-CM | POA: Diagnosis not present

## 2017-10-03 MED ORDER — IOPAMIDOL (ISOVUE-300) INJECTION 61%
80.0000 mL | Freq: Once | INTRAVENOUS | Status: AC | PRN
  Administered 2017-10-03: 80 mL via INTRAVENOUS

## 2017-10-07 DIAGNOSIS — I482 Chronic atrial fibrillation: Secondary | ICD-10-CM | POA: Diagnosis not present

## 2017-10-07 DIAGNOSIS — F329 Major depressive disorder, single episode, unspecified: Secondary | ICD-10-CM | POA: Diagnosis not present

## 2017-10-07 DIAGNOSIS — I10 Essential (primary) hypertension: Secondary | ICD-10-CM | POA: Diagnosis not present

## 2017-10-07 DIAGNOSIS — I429 Cardiomyopathy, unspecified: Secondary | ICD-10-CM | POA: Diagnosis not present

## 2017-10-07 DIAGNOSIS — E785 Hyperlipidemia, unspecified: Secondary | ICD-10-CM | POA: Diagnosis not present

## 2017-10-07 DIAGNOSIS — R2689 Other abnormalities of gait and mobility: Secondary | ICD-10-CM | POA: Diagnosis not present

## 2017-10-07 DIAGNOSIS — M4800 Spinal stenosis, site unspecified: Secondary | ICD-10-CM | POA: Diagnosis not present

## 2017-10-07 DIAGNOSIS — R296 Repeated falls: Secondary | ICD-10-CM | POA: Diagnosis not present

## 2017-10-07 DIAGNOSIS — E119 Type 2 diabetes mellitus without complications: Secondary | ICD-10-CM | POA: Diagnosis not present

## 2017-10-09 DIAGNOSIS — M4800 Spinal stenosis, site unspecified: Secondary | ICD-10-CM | POA: Diagnosis not present

## 2017-10-09 DIAGNOSIS — E119 Type 2 diabetes mellitus without complications: Secondary | ICD-10-CM | POA: Diagnosis not present

## 2017-10-09 DIAGNOSIS — E785 Hyperlipidemia, unspecified: Secondary | ICD-10-CM | POA: Diagnosis not present

## 2017-10-09 DIAGNOSIS — I429 Cardiomyopathy, unspecified: Secondary | ICD-10-CM | POA: Diagnosis not present

## 2017-10-09 DIAGNOSIS — I482 Chronic atrial fibrillation: Secondary | ICD-10-CM | POA: Diagnosis not present

## 2017-10-09 DIAGNOSIS — I10 Essential (primary) hypertension: Secondary | ICD-10-CM | POA: Diagnosis not present

## 2017-10-09 DIAGNOSIS — R2689 Other abnormalities of gait and mobility: Secondary | ICD-10-CM | POA: Diagnosis not present

## 2017-10-09 DIAGNOSIS — R296 Repeated falls: Secondary | ICD-10-CM | POA: Diagnosis not present

## 2017-10-09 DIAGNOSIS — F329 Major depressive disorder, single episode, unspecified: Secondary | ICD-10-CM | POA: Diagnosis not present

## 2017-10-10 ENCOUNTER — Other Ambulatory Visit: Payer: Self-pay

## 2017-10-10 DIAGNOSIS — R0602 Shortness of breath: Secondary | ICD-10-CM

## 2017-10-11 DIAGNOSIS — R3912 Poor urinary stream: Secondary | ICD-10-CM | POA: Diagnosis not present

## 2017-10-11 DIAGNOSIS — R350 Frequency of micturition: Secondary | ICD-10-CM | POA: Diagnosis not present

## 2017-10-15 DIAGNOSIS — R296 Repeated falls: Secondary | ICD-10-CM | POA: Diagnosis not present

## 2017-10-15 DIAGNOSIS — M4800 Spinal stenosis, site unspecified: Secondary | ICD-10-CM | POA: Diagnosis not present

## 2017-10-15 DIAGNOSIS — E119 Type 2 diabetes mellitus without complications: Secondary | ICD-10-CM | POA: Diagnosis not present

## 2017-10-15 DIAGNOSIS — I429 Cardiomyopathy, unspecified: Secondary | ICD-10-CM | POA: Diagnosis not present

## 2017-10-15 DIAGNOSIS — R2689 Other abnormalities of gait and mobility: Secondary | ICD-10-CM | POA: Diagnosis not present

## 2017-10-15 DIAGNOSIS — I482 Chronic atrial fibrillation: Secondary | ICD-10-CM | POA: Diagnosis not present

## 2017-10-15 DIAGNOSIS — E785 Hyperlipidemia, unspecified: Secondary | ICD-10-CM | POA: Diagnosis not present

## 2017-10-15 DIAGNOSIS — I10 Essential (primary) hypertension: Secondary | ICD-10-CM | POA: Diagnosis not present

## 2017-10-15 DIAGNOSIS — F329 Major depressive disorder, single episode, unspecified: Secondary | ICD-10-CM | POA: Diagnosis not present

## 2017-10-18 DIAGNOSIS — R2689 Other abnormalities of gait and mobility: Secondary | ICD-10-CM | POA: Diagnosis not present

## 2017-10-18 DIAGNOSIS — M4800 Spinal stenosis, site unspecified: Secondary | ICD-10-CM | POA: Diagnosis not present

## 2017-10-18 DIAGNOSIS — R296 Repeated falls: Secondary | ICD-10-CM | POA: Diagnosis not present

## 2017-10-18 DIAGNOSIS — I429 Cardiomyopathy, unspecified: Secondary | ICD-10-CM | POA: Diagnosis not present

## 2017-10-18 DIAGNOSIS — I10 Essential (primary) hypertension: Secondary | ICD-10-CM | POA: Diagnosis not present

## 2017-10-18 DIAGNOSIS — I482 Chronic atrial fibrillation: Secondary | ICD-10-CM | POA: Diagnosis not present

## 2017-10-18 DIAGNOSIS — F329 Major depressive disorder, single episode, unspecified: Secondary | ICD-10-CM | POA: Diagnosis not present

## 2017-10-18 DIAGNOSIS — E119 Type 2 diabetes mellitus without complications: Secondary | ICD-10-CM | POA: Diagnosis not present

## 2017-10-18 DIAGNOSIS — E785 Hyperlipidemia, unspecified: Secondary | ICD-10-CM | POA: Diagnosis not present

## 2017-10-22 DIAGNOSIS — R2689 Other abnormalities of gait and mobility: Secondary | ICD-10-CM | POA: Diagnosis not present

## 2017-10-22 DIAGNOSIS — H2511 Age-related nuclear cataract, right eye: Secondary | ICD-10-CM | POA: Diagnosis not present

## 2017-10-22 DIAGNOSIS — M4800 Spinal stenosis, site unspecified: Secondary | ICD-10-CM | POA: Diagnosis not present

## 2017-10-22 DIAGNOSIS — I10 Essential (primary) hypertension: Secondary | ICD-10-CM | POA: Diagnosis not present

## 2017-10-22 DIAGNOSIS — R296 Repeated falls: Secondary | ICD-10-CM | POA: Diagnosis not present

## 2017-10-22 DIAGNOSIS — H2512 Age-related nuclear cataract, left eye: Secondary | ICD-10-CM | POA: Diagnosis not present

## 2017-10-22 DIAGNOSIS — I429 Cardiomyopathy, unspecified: Secondary | ICD-10-CM | POA: Diagnosis not present

## 2017-10-22 DIAGNOSIS — E119 Type 2 diabetes mellitus without complications: Secondary | ICD-10-CM | POA: Diagnosis not present

## 2017-10-22 DIAGNOSIS — F329 Major depressive disorder, single episode, unspecified: Secondary | ICD-10-CM | POA: Diagnosis not present

## 2017-10-22 DIAGNOSIS — H25012 Cortical age-related cataract, left eye: Secondary | ICD-10-CM | POA: Diagnosis not present

## 2017-10-22 DIAGNOSIS — H25011 Cortical age-related cataract, right eye: Secondary | ICD-10-CM | POA: Diagnosis not present

## 2017-10-22 DIAGNOSIS — E785 Hyperlipidemia, unspecified: Secondary | ICD-10-CM | POA: Diagnosis not present

## 2017-10-22 DIAGNOSIS — I482 Chronic atrial fibrillation: Secondary | ICD-10-CM | POA: Diagnosis not present

## 2017-10-24 ENCOUNTER — Other Ambulatory Visit: Payer: Self-pay | Admitting: Cardiology

## 2017-10-24 ENCOUNTER — Ambulatory Visit (INDEPENDENT_AMBULATORY_CARE_PROVIDER_SITE_OTHER): Payer: Medicare HMO | Admitting: Family

## 2017-10-24 MED ORDER — FUROSEMIDE 40 MG PO TABS: 40.0000 mg | ORAL_TABLET | Freq: Every day | ORAL | 1 refills | Status: DC

## 2017-10-24 MED ORDER — CARVEDILOL 6.25 MG PO TABS: 6.2500 mg | ORAL_TABLET | Freq: Two times a day (BID) | ORAL | 1 refills | Status: DC

## 2017-10-28 ENCOUNTER — Ambulatory Visit (INDEPENDENT_AMBULATORY_CARE_PROVIDER_SITE_OTHER): Payer: Medicare HMO | Admitting: Family

## 2017-10-28 DIAGNOSIS — I429 Cardiomyopathy, unspecified: Secondary | ICD-10-CM | POA: Diagnosis not present

## 2017-10-28 DIAGNOSIS — E119 Type 2 diabetes mellitus without complications: Secondary | ICD-10-CM | POA: Diagnosis not present

## 2017-10-28 DIAGNOSIS — I482 Chronic atrial fibrillation: Secondary | ICD-10-CM | POA: Diagnosis not present

## 2017-10-28 DIAGNOSIS — F329 Major depressive disorder, single episode, unspecified: Secondary | ICD-10-CM | POA: Diagnosis not present

## 2017-10-28 DIAGNOSIS — I10 Essential (primary) hypertension: Secondary | ICD-10-CM | POA: Diagnosis not present

## 2017-10-28 DIAGNOSIS — M4800 Spinal stenosis, site unspecified: Secondary | ICD-10-CM | POA: Diagnosis not present

## 2017-10-28 DIAGNOSIS — E785 Hyperlipidemia, unspecified: Secondary | ICD-10-CM | POA: Diagnosis not present

## 2017-10-28 DIAGNOSIS — R296 Repeated falls: Secondary | ICD-10-CM | POA: Diagnosis not present

## 2017-10-28 DIAGNOSIS — R2689 Other abnormalities of gait and mobility: Secondary | ICD-10-CM | POA: Diagnosis not present

## 2017-10-30 ENCOUNTER — Encounter (INDEPENDENT_AMBULATORY_CARE_PROVIDER_SITE_OTHER): Payer: Self-pay | Admitting: Family

## 2017-10-30 ENCOUNTER — Ambulatory Visit (INDEPENDENT_AMBULATORY_CARE_PROVIDER_SITE_OTHER): Payer: Medicare HMO | Admitting: Family

## 2017-10-30 VITALS — Ht 73.0 in | Wt 250.0 lb

## 2017-10-30 DIAGNOSIS — Z89511 Acquired absence of right leg below knee: Secondary | ICD-10-CM | POA: Diagnosis not present

## 2017-10-30 NOTE — Progress Notes (Signed)
Office Visit Note   Patient: Thomas Wright           Date of Birth: 11-Mar-1946           MRN: [id number] Visit Date: 10/30/2017              Requested by: Leighton Ruff, MD [place], [place] [address] PCP: Leighton Ruff, MD  Chief Complaint  Patient presents with  . Right Leg - Follow-up    Right BKA 01/13/16      HPI: The patient is a 72 year old gentleman who is status post right below the knee amputation.  States had a new socket as well as liners made for him about 4 weeks ago has developed a skin tear distally from an bearing to his right residual limb.  Has been doing dry dressing changes with Band-Aids.  Assessment & Plan: Visit Diagnoses:  1. S/P unilateral BKA (below knee amputation), right ([place])     Plan: Continue with daily wound cleansing.  May apply Neosporin dressings and Band-Aids.  Discussed the importance of nonweightbearing not wearing the prosthesis until these have healed.  If develops ulceration again may need to follow-up with biotech for modifications to his socket to offload the tibia.  States he does have an appointment with biotech on March 5 to have his prosthesis reevaluated.  We will follow-up in the office with [place] that week if having ongoing ulcerations  Follow-Up Instructions: Return in about 3 weeks (around 11/20/2017), or if symptoms worsen or fail to improve.   Ortho Exam  Patient is alert, oriented, no adenopathy, well-dressed, normal affect, normal respiratory effort. On examination of right residual limb has 2 open skin tears from end bearing. The distal ulcer is 15 mm x 4 mm, no depth. Granulation in wound bed. No active drainage. Serosanguinous drainage on his dressing. Has a smaller ulcer just proximal to this, is 5 mm in diameter and also with no depth and granulation tissue. No surrounding erythema, odor or sign of infection.  Imaging: No results found. No images are attached to the encounter.  Labs: Lab Results    Component Value Date   HGBA1C 8.4 (H) 05/15/2017   HGBA1C 12.6 (H) 01/09/2016   HGBA1C 11.0 06/20/2015   ESRSEDRATE 80 (H) 01/10/2016   ESRSEDRATE 62 (H) 01/09/2016   ESRSEDRATE 11 05/10/2015   CRP 24.2 (H) 01/10/2016   CRP 24.8 (H) 01/09/2016   CRP 0.1 (L) 05/10/2015   REPTSTATUS 05/20/2017 FINAL 05/15/2017   GRAMSTAIN  07/29/2014    MODERATE WBC PRESENT,BOTH PMN AND MONONUCLEAR NO SQUAMOUS EPITHELIAL CELLS SEEN FEW GRAM POSITIVE COCCI IN PAIRS RARE GRAM NEGATIVE RODS Performed at [facility]  07/29/2014    MODERATE WBC PRESENT,BOTH PMN AND MONONUCLEAR NO SQUAMOUS EPITHELIAL CELLS SEEN FEW GRAM POSITIVE COCCI IN PAIRS RARE GRAM NEGATIVE RODS Performed at [facility]    CULT NO GROWTH 5 DAYS 05/15/2017   [place] STAPHYLOCOCCUS AUREUS 07/29/2014    @LABSALLVALUES (HGBA1)@  Body mass index is 32.98 kg/m.  Orders:  No orders of the defined types were placed in this encounter.  No orders of the defined types were placed in this encounter.    Procedures: No procedures performed  Clinical Data: No additional findings.  ROS:  All other systems negative, except as noted in the HPI. Review of Systems  Constitutional: Negative for chills and fever.  Cardiovascular: Negative for leg swelling.  Skin: Positive for wound. Negative for color change  and rash.    Objective: Vital Signs: Ht 6\' 1"  (1.854 m)   Wt 250 lb (113.4 kg)   BMI 32.98 kg/m   Specialty Comments:  No specialty comments available.  PMFS History: Patient Active Problem List   Diagnosis Date Noted  . Encounter for colonoscopy due to history of adenomatous colonic polyps 05/29/2017  . Intractable nausea and vomiting 05/14/2017  . Medication reaction 05/14/2017  . Metabolic acidosis 25/95/6387  . CKD (chronic kidney disease), stage III ([place]) 05/14/2017  . Abdominal pain   . Emesis   . Bilateral carotid artery disease ([place]) 04/01/2017  . Diabetic polyneuropathy  associated with type 2 diabetes mellitus ([place]) 02/14/2017  . Impingement syndrome of right shoulder 02/14/2017  . Fluid retention   . Labile blood glucose   . Toe erythema   . Hypoglycemia associated with type 2 diabetes mellitus ([place])   . Hyponatremia   . Diarrhea 01/17/2016  . Amputation of right lower extremity below knee with complication ([place]) 56/43/3295  . AKI (acute kidney injury) ([place])   . Type 2 diabetes mellitus with peripheral neuropathy (H[facility])   . Status post aortic valve replacement   . Chronic combined systolic and diastolic congestive heart failure ([place])   . ATN (acute tubular necrosis) ([place])   . Postoperative pain   . Acute blood loss anemia   . Leukocytosis   . Morbid obesity ([place])   . S/P unilateral BKA (below knee amputation), right ([place])   . Diabetes mellitus with complication ([place]) 18/84/1660  . Diabetic foot infection ([place]) 01/09/2016  . Sepsis, unspecified organism ([place]) 01/09/2016  . PAF (paroxysmal atrial fibrillation) ([place]) 04/22/2015  . Essential hypertension 04/22/2015  . Obesity 04/22/2015  . Dyspnea 04/20/2015  . Acute on chronic diastolic CHF (congestive heart failure) ([place]) 04/19/2015  . Diabetes ([place]) 09/13/2014  . Chronic anticoagulation 09/13/2014  . Atrial flutter, unspecified   . Paroxysmal atrial fibrillation (H[facility])   . Chest pain   . Perirectal abscess   . Other hemorrhoids   . Tenosynovitis of finger 05/05/2014  . Back pain   . H/O aortic valve replacement with tissue graft   . HLD (hyperlipidemia)   . Hypertension    Past Medical History:  Diagnosis Date  . Arthritis    lower back  . Cardiomyopathy ([place])    EF originally 20%-now 45%, no CAD on cath  . Chronic combined systolic and diastolic CHF (congestive heart failure) ([place])    a. 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 Reported h/o cath in 2008 without significant CAD.  Marland Kitchen Hyperlipidemia few yrs ago  .  Hypertension   . Hypertriglyceridemia   . PAF (paroxysmal atrial fibrillation) ([place])    a. Postop 2008. b. recurrent afib 08/2014 with LAA clot noted on TEE. EF was down again at 25%. He was anticoagulated with Eliquis and placed on Amiodarone.He ultimately underwent TEE-DCCV 08/2014.  . S/P aortic valve replacement with bioprosthetic valve    a. bioprosthetic AVR in 05/2007 in [place].bovine valve  . Type II diabetes mellitus (HCC)     Family History  Problem Relation Age of Onset  . Colon cancer Father   . Diabetes Father   . Liver disease Brother   . Heart murmur Child   . Congenital heart disease Other   . Diabetes Maternal Grandmother   . Heart disease Maternal Grandfather        A Fib  Past Surgical History:  Procedure Laterality Date  . AMPUTATION Right 07/29/2014   Procedure: AMPUTATION RIGHT LONG FINGER;  Surgeon: [name], MD;  Location: [facility];  Service: Orthopedics;  Laterality: Right;  . AMPUTATION Right 01/13/2016   Procedure: RIGHT BELOW KNEE AMPUTATION;  Surgeon: Newt Minion, MD;  Location: [facility];  Service: Orthopedics;  Laterality: Right;  . AORTIC VALVE REPLACEMENT  05/2007   with tissue graft  . APPLICATION OF WOUND VAC Right 01/13/2016   Procedure: APPLICATION OF WOUND VAC;  Surgeon: Newt Minion, MD;  Location: [facility];  Service: Orthopedics;  Laterality: Right;  . CARDIAC CATHETERIZATION  "several"  . CARDIAC VALVE REPLACEMENT    . CARDIOVERSION N/A 07/23/2014   Procedure: CARDIOVERSION;  Surgeon: Josue Hector, MD;  Location: [facility];  Service: Cardiovascular;  Laterality: N/A;  . CARDIOVERSION N/A 09/01/2014   Procedure: CARDIOVERSION;  Surgeon: [name], MD;  Location: [facility];  Service: Cardiovascular;  Laterality: N/A;  . COLONOSCOPY WITH PROPOFOL N/A 05/29/2017   Procedure: COLONOSCOPY WITH PROPOFOL;  Surgeon: Wilford Corner, MD;  Location: [facility];  Service: Endoscopy;  Laterality: N/A;  . I&D EXTREMITY Right 05/05/2014    Procedure: IRRIGATION AND DEBRIDEMENT EXTREMITY;  Surgeon: [name], MD;  Location: [facility];  Service: Orthopedics;  Laterality: Right;  . LEFT HEART CATH AND CORONARY ANGIOGRAPHY N/A 05/17/2017   Procedure: LEFT HEART CATH AND CORONARY ANGIOGRAPHY;  Surgeon: Martinique, Peter M, MD;  Location: [facility];  Service: Cardiovascular;  Laterality: N/A;  . TEE WITHOUT CARDIOVERSION N/A 07/23/2014   Procedure: TRANSESOPHAGEAL ECHOCARDIOGRAM (TEE);  Surgeon: Josue Hector, MD;  Location: [facility];  Service: Cardiovascular;  Laterality: N/A;  . TEE WITHOUT CARDIOVERSION N/A 09/01/2014   Procedure: TRANSESOPHAGEAL ECHOCARDIOGRAM (TEE);  Surgeon: [name], MD;  Location: [facility];  Service: Cardiovascular;  Laterality: N/A;  . TEE WITHOUT CARDIOVERSION N/A 04/17/2017   Procedure: TRANSESOPHAGEAL ECHOCARDIOGRAM (TEE);  Surgeon: Jerline Pain, MD;  Location[facility];  Service: Cardiovascular;  Laterality: N/A;  . TONSILLECTOMY  1954   Social History   Occupational History  . Occupation: retired   Tobacco Use  . Smoking status: Never Smoker  . Smokeless tobacco: Never Used  Substance and Sexual Activity  . Alcohol use: Yes    Alcohol/week: 0.0 oz    Comment: 04/19/2015 "might have a beer or 2, 2-3 times/yr"  . Drug use: No  . Sexual activity: No

## 2017-11-01 DIAGNOSIS — R2689 Other abnormalities of gait and mobility: Secondary | ICD-10-CM | POA: Diagnosis not present

## 2017-11-01 DIAGNOSIS — R296 Repeated falls: Secondary | ICD-10-CM | POA: Diagnosis not present

## 2017-11-01 DIAGNOSIS — F329 Major depressive disorder, single episode, unspecified: Secondary | ICD-10-CM | POA: Diagnosis not present

## 2017-11-01 DIAGNOSIS — M4800 Spinal stenosis, site unspecified: Secondary | ICD-10-CM | POA: Diagnosis not present

## 2017-11-01 DIAGNOSIS — E119 Type 2 diabetes mellitus without complications: Secondary | ICD-10-CM | POA: Diagnosis not present

## 2017-11-01 DIAGNOSIS — E785 Hyperlipidemia, unspecified: Secondary | ICD-10-CM | POA: Diagnosis not present

## 2017-11-01 DIAGNOSIS — I482 Chronic atrial fibrillation: Secondary | ICD-10-CM | POA: Diagnosis not present

## 2017-11-01 DIAGNOSIS — I429 Cardiomyopathy, unspecified: Secondary | ICD-10-CM | POA: Diagnosis not present

## 2017-11-01 DIAGNOSIS — I10 Essential (primary) hypertension: Secondary | ICD-10-CM | POA: Diagnosis not present

## 2017-11-08 ENCOUNTER — Telehealth (INDEPENDENT_AMBULATORY_CARE_PROVIDER_SITE_OTHER): Payer: Self-pay | Admitting: Orthopedic Surgery

## 2017-11-08 NOTE — Telephone Encounter (Signed)
Patient needs letter stating that he needs a wheelchair ramp built due to his condition, his therapist approves of this. Patient would like this letter sent as soon as possible because he moves in on Tuesday 2/26. If you can mail the letter to his address :  [address] [place], [place] [address]

## 2017-11-11 ENCOUNTER — Other Ambulatory Visit (INDEPENDENT_AMBULATORY_CARE_PROVIDER_SITE_OTHER): Payer: Self-pay

## 2017-11-11 NOTE — Telephone Encounter (Signed)
Letter written and mailed to pt as requested.  

## 2017-11-27 DIAGNOSIS — H2511 Age-related nuclear cataract, right eye: Secondary | ICD-10-CM | POA: Diagnosis not present

## 2017-11-27 DIAGNOSIS — H25012 Cortical age-related cataract, left eye: Secondary | ICD-10-CM | POA: Diagnosis not present

## 2017-11-27 DIAGNOSIS — H2512 Age-related nuclear cataract, left eye: Secondary | ICD-10-CM | POA: Diagnosis not present

## 2017-12-04 DIAGNOSIS — H2512 Age-related nuclear cataract, left eye: Secondary | ICD-10-CM | POA: Diagnosis not present

## 2017-12-25 DIAGNOSIS — E1165 Type 2 diabetes mellitus with hyperglycemia: Secondary | ICD-10-CM | POA: Diagnosis not present

## 2017-12-25 DIAGNOSIS — E782 Mixed hyperlipidemia: Secondary | ICD-10-CM | POA: Diagnosis not present

## 2017-12-25 DIAGNOSIS — Z794 Long term (current) use of insulin: Secondary | ICD-10-CM | POA: Diagnosis not present

## 2017-12-30 DIAGNOSIS — R809 Proteinuria, unspecified: Secondary | ICD-10-CM | POA: Diagnosis not present

## 2017-12-30 DIAGNOSIS — I5032 Chronic diastolic (congestive) heart failure: Secondary | ICD-10-CM | POA: Diagnosis not present

## 2017-12-30 DIAGNOSIS — Z89511 Acquired absence of right leg below knee: Secondary | ICD-10-CM | POA: Diagnosis not present

## 2017-12-30 DIAGNOSIS — E1165 Type 2 diabetes mellitus with hyperglycemia: Secondary | ICD-10-CM | POA: Diagnosis not present

## 2017-12-30 DIAGNOSIS — E1142 Type 2 diabetes mellitus with diabetic polyneuropathy: Secondary | ICD-10-CM | POA: Diagnosis not present

## 2017-12-30 DIAGNOSIS — E559 Vitamin D deficiency, unspecified: Secondary | ICD-10-CM | POA: Diagnosis not present

## 2017-12-30 DIAGNOSIS — Z794 Long term (current) use of insulin: Secondary | ICD-10-CM | POA: Diagnosis not present

## 2017-12-30 DIAGNOSIS — I129 Hypertensive chronic kidney disease with stage 1 through stage 4 chronic kidney disease, or unspecified chronic kidney disease: Secondary | ICD-10-CM | POA: Diagnosis not present

## 2017-12-30 DIAGNOSIS — E782 Mixed hyperlipidemia: Secondary | ICD-10-CM | POA: Diagnosis not present

## 2018-01-06 DIAGNOSIS — Z794 Long term (current) use of insulin: Secondary | ICD-10-CM | POA: Diagnosis not present

## 2018-01-06 DIAGNOSIS — Z89511 Acquired absence of right leg below knee: Secondary | ICD-10-CM | POA: Diagnosis not present

## 2018-01-06 DIAGNOSIS — R2681 Unsteadiness on feet: Secondary | ICD-10-CM | POA: Diagnosis not present

## 2018-01-06 DIAGNOSIS — Z7901 Long term (current) use of anticoagulants: Secondary | ICD-10-CM | POA: Diagnosis not present

## 2018-01-06 DIAGNOSIS — E1165 Type 2 diabetes mellitus with hyperglycemia: Secondary | ICD-10-CM | POA: Diagnosis not present

## 2018-01-10 DIAGNOSIS — Z89511 Acquired absence of right leg below knee: Secondary | ICD-10-CM | POA: Diagnosis not present

## 2018-01-10 DIAGNOSIS — E1165 Type 2 diabetes mellitus with hyperglycemia: Secondary | ICD-10-CM | POA: Diagnosis not present

## 2018-01-10 DIAGNOSIS — Z794 Long term (current) use of insulin: Secondary | ICD-10-CM | POA: Diagnosis not present

## 2018-01-10 DIAGNOSIS — Z7901 Long term (current) use of anticoagulants: Secondary | ICD-10-CM | POA: Diagnosis not present

## 2018-01-10 DIAGNOSIS — R2681 Unsteadiness on feet: Secondary | ICD-10-CM | POA: Diagnosis not present

## 2018-01-14 DIAGNOSIS — R2681 Unsteadiness on feet: Secondary | ICD-10-CM | POA: Diagnosis not present

## 2018-01-14 DIAGNOSIS — Z794 Long term (current) use of insulin: Secondary | ICD-10-CM | POA: Diagnosis not present

## 2018-01-14 DIAGNOSIS — Z7901 Long term (current) use of anticoagulants: Secondary | ICD-10-CM | POA: Diagnosis not present

## 2018-01-14 DIAGNOSIS — Z89511 Acquired absence of right leg below knee: Secondary | ICD-10-CM | POA: Diagnosis not present

## 2018-01-14 DIAGNOSIS — E1165 Type 2 diabetes mellitus with hyperglycemia: Secondary | ICD-10-CM | POA: Diagnosis not present

## 2018-01-20 DIAGNOSIS — E1165 Type 2 diabetes mellitus with hyperglycemia: Secondary | ICD-10-CM | POA: Diagnosis not present

## 2018-01-20 DIAGNOSIS — Z7901 Long term (current) use of anticoagulants: Secondary | ICD-10-CM | POA: Diagnosis not present

## 2018-01-20 DIAGNOSIS — R2681 Unsteadiness on feet: Secondary | ICD-10-CM | POA: Diagnosis not present

## 2018-01-20 DIAGNOSIS — Z794 Long term (current) use of insulin: Secondary | ICD-10-CM | POA: Diagnosis not present

## 2018-01-20 DIAGNOSIS — Z89511 Acquired absence of right leg below knee: Secondary | ICD-10-CM | POA: Diagnosis not present

## 2018-01-23 DIAGNOSIS — Z7901 Long term (current) use of anticoagulants: Secondary | ICD-10-CM | POA: Diagnosis not present

## 2018-01-23 DIAGNOSIS — Z89511 Acquired absence of right leg below knee: Secondary | ICD-10-CM | POA: Diagnosis not present

## 2018-01-23 DIAGNOSIS — R2681 Unsteadiness on feet: Secondary | ICD-10-CM | POA: Diagnosis not present

## 2018-01-23 DIAGNOSIS — E1165 Type 2 diabetes mellitus with hyperglycemia: Secondary | ICD-10-CM | POA: Diagnosis not present

## 2018-01-23 DIAGNOSIS — Z794 Long term (current) use of insulin: Secondary | ICD-10-CM | POA: Diagnosis not present

## 2018-01-29 DIAGNOSIS — E1165 Type 2 diabetes mellitus with hyperglycemia: Secondary | ICD-10-CM | POA: Diagnosis not present

## 2018-01-29 DIAGNOSIS — Z89511 Acquired absence of right leg below knee: Secondary | ICD-10-CM | POA: Diagnosis not present

## 2018-01-29 DIAGNOSIS — R2681 Unsteadiness on feet: Secondary | ICD-10-CM | POA: Diagnosis not present

## 2018-01-29 DIAGNOSIS — Z794 Long term (current) use of insulin: Secondary | ICD-10-CM | POA: Diagnosis not present

## 2018-01-29 DIAGNOSIS — Z7901 Long term (current) use of anticoagulants: Secondary | ICD-10-CM | POA: Diagnosis not present

## 2018-02-06 DIAGNOSIS — Z794 Long term (current) use of insulin: Secondary | ICD-10-CM | POA: Diagnosis not present

## 2018-02-06 DIAGNOSIS — E1165 Type 2 diabetes mellitus with hyperglycemia: Secondary | ICD-10-CM | POA: Diagnosis not present

## 2018-02-06 DIAGNOSIS — Z89511 Acquired absence of right leg below knee: Secondary | ICD-10-CM | POA: Diagnosis not present

## 2018-02-06 DIAGNOSIS — Z7901 Long term (current) use of anticoagulants: Secondary | ICD-10-CM | POA: Diagnosis not present

## 2018-02-06 DIAGNOSIS — R2681 Unsteadiness on feet: Secondary | ICD-10-CM | POA: Diagnosis not present

## 2018-02-11 DIAGNOSIS — Z794 Long term (current) use of insulin: Secondary | ICD-10-CM | POA: Diagnosis not present

## 2018-02-11 DIAGNOSIS — Z89511 Acquired absence of right leg below knee: Secondary | ICD-10-CM | POA: Diagnosis not present

## 2018-02-11 DIAGNOSIS — R2681 Unsteadiness on feet: Secondary | ICD-10-CM | POA: Diagnosis not present

## 2018-02-11 DIAGNOSIS — Z7901 Long term (current) use of anticoagulants: Secondary | ICD-10-CM | POA: Diagnosis not present

## 2018-02-11 DIAGNOSIS — E1165 Type 2 diabetes mellitus with hyperglycemia: Secondary | ICD-10-CM | POA: Diagnosis not present

## 2018-02-13 DIAGNOSIS — I5032 Chronic diastolic (congestive) heart failure: Secondary | ICD-10-CM | POA: Diagnosis not present

## 2018-02-13 DIAGNOSIS — J069 Acute upper respiratory infection, unspecified: Secondary | ICD-10-CM | POA: Diagnosis not present

## 2018-02-14 DIAGNOSIS — Z794 Long term (current) use of insulin: Secondary | ICD-10-CM | POA: Diagnosis not present

## 2018-02-14 DIAGNOSIS — Z89511 Acquired absence of right leg below knee: Secondary | ICD-10-CM | POA: Diagnosis not present

## 2018-02-14 DIAGNOSIS — Z7901 Long term (current) use of anticoagulants: Secondary | ICD-10-CM | POA: Diagnosis not present

## 2018-02-14 DIAGNOSIS — R2681 Unsteadiness on feet: Secondary | ICD-10-CM | POA: Diagnosis not present

## 2018-02-14 DIAGNOSIS — E1165 Type 2 diabetes mellitus with hyperglycemia: Secondary | ICD-10-CM | POA: Diagnosis not present

## 2018-02-18 ENCOUNTER — Ambulatory Visit (INDEPENDENT_AMBULATORY_CARE_PROVIDER_SITE_OTHER): Payer: Medicare HMO | Admitting: Nurse Practitioner

## 2018-02-18 ENCOUNTER — Encounter: Payer: Self-pay | Admitting: Nurse Practitioner

## 2018-02-18 VITALS — BP 120/80 | HR 81 | Ht 72.0 in | Wt 242.4 lb

## 2018-02-18 DIAGNOSIS — I480 Paroxysmal atrial fibrillation: Secondary | ICD-10-CM | POA: Diagnosis not present

## 2018-02-18 DIAGNOSIS — Z79899 Other long term (current) drug therapy: Secondary | ICD-10-CM

## 2018-02-18 DIAGNOSIS — I5041 Acute combined systolic (congestive) and diastolic (congestive) heart failure: Secondary | ICD-10-CM

## 2018-02-18 LAB — BASIC METABOLIC PANEL
BUN/Creatinine Ratio: 15 (ref 10–24)
BUN: 23 mg/dL (ref 8–27)
CO2: 26 mmol/L (ref 20–29)
Calcium: 9.4 mg/dL (ref 8.6–10.2)
Chloride: 92 mmol/L — ABNORMAL LOW (ref 96–106)
Creatinine, Ser: 1.55 mg/dL — ABNORMAL HIGH (ref 0.76–1.27)
GFR calc Af Amer: 52 mL/min/{1.73_m2} — ABNORMAL LOW (ref >59)
GFR calc non Af Amer: 45 mL/min/{1.73_m2} — ABNORMAL LOW (ref >59)
Glucose: 341 mg/dL — ABNORMAL HIGH (ref 65–99)
Potassium: 5 mmol/L (ref 3.5–5.2)
Sodium: 134 mmol/L (ref 134–144)

## 2018-02-18 LAB — HEPATIC FUNCTION PANEL
ALT: 19 IU/L (ref 0–44)
AST: 19 IU/L (ref 0–40)
Albumin: 3.9 g/dL (ref 3.5–4.8)
Alkaline Phosphatase: 93 IU/L (ref 39–117)
Bilirubin Total: 0.5 mg/dL (ref 0.0–1.2)
Bilirubin, Direct: 0.14 mg/dL (ref 0.00–0.40)
Total Protein: 7.8 g/dL (ref 6.0–8.5)

## 2018-02-18 LAB — TSH: TSH: 1.51 u[IU]/mL (ref 0.450–4.500)

## 2018-02-18 LAB — PRO B NATRIURETIC PEPTIDE: NT-Pro BNP: 1634 pg/mL — ABNORMAL HIGH (ref 0–376)

## 2018-02-18 LAB — CBC
Hematocrit: 44.2 % (ref 37.5–51.0)
Hemoglobin: 14.5 g/dL (ref 13.0–17.7)
MCH: 27.4 pg (ref 26.6–33.0)
MCHC: 32.8 g/dL (ref 31.5–35.7)
MCV: 84 fL (ref 79–97)
Platelets: 447 10*3/uL (ref 150–450)
RBC: 5.29 x10E6/uL (ref 4.14–5.80)
RDW: 13.6 % (ref 12.3–15.4)
WBC: 10 10*3/uL (ref 3.4–10.8)

## 2018-02-18 NOTE — Patient Instructions (Addendum)
We will be checking the following labs today - BMET, CBC, HPF, TSH and BNP   Medication Instructions:    Continue with your current medicines. BUT  I am increasing the Lasix to 60 mg (a pill and a half) for 3 days - then back to 40 mg a day    Testing/Procedures To Be Arranged:  Echocardiogram  Follow-Up:   See Dr. Marlou Porch in one month    Other Special Instructions:   Restrict salt as we talked about. Look at the labels - try to limit to less than 1500 mg of sodium a day.     If you need a refill on your cardiac medications before your next appointment, please call your pharmacy.   Call the [place] office at [phone] if you have any questions, problems or concerns.

## 2018-02-18 NOTE — Progress Notes (Signed)
CARDIOLOGY OFFICE NOTE  Date:  02/18/2018    Thomas Wright Date of Birth: Feb 17, 1946 Medical Record #[id number]  PCP:  Leighton Ruff, MD  Cardiologist:  [facility]  Chief Complaint  Patient presents with  . Congestive Heart Failure  . Cardiomyopathy    1 year check - seen for Dr. Marlou Porch    History of Present Illness: Thomas Wright is a 72 y.o. male who presents today for a follow up visit. 11 month check. Seen for Dr. Marlou Porch.   He has a history of a bioprosthetic aortic valve replacement 05/28/2007 by Dr. Earnstine Regal in [place].Marland Kitchen This was a 29 mm bioprosthetic Edwards valve.  At the time his ejection fraction was 30% with aortic root dilatation of 4.5 cm.  His coronaries looked good. 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   He was hospitalized in 2016 with 20# weight gain - interestingly, his BNP was normal. No changes on chest CT scan to suggest amiodarone toxicity. He has been on amiodarone since his surgery. Previously had stopped it in 2013. 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 His atrial fibrillation came with a finger infection exacerbated by uncontrolled diabetes.  PFTs demonstrated moderate to severe restriction consistent with fibrosis or interstitial inflammations. Pulmonary referral was made. He had significant exposure in the 60s and 70s to coal dust. 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  He has had issues in the past with marked hypotension - medicines have had to be cut back.   He was last seen by Dr. Marlou Porch almost one year ago. Chronic fatigue and chronic SOB. 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 High risk. Ended up  having stress test which led to TEE and TTE. Also had repeat cath showing no significant CAD. EF remains quite depressed.   Comes in today. Here with his wife. He ambulates with a walker. Has a R prosthesis. He was not aware that he has not been here - did not get his recall. Notes he never had his shoulder surgery that he had been deemed high risk. Interferred with his therapy for his prosthesis which he has been trying to do for better balance. He and his wife got married back in April. She has been trying to do better with the cooking and restricting his salt. She got a cold/URI - that he then got - he got more short of breath - actually choking at night - saw PCP last week - given extra lasix for a few days - told to call her. He is feeling better but still with early satiety, bloating. No actual swelling. His weight had gone up - now back down some. Trying to restrict his salt but he had been drinking chicken noodle soup. No chest pain. No palpitations. He did see Dr. Roxan Hockey - had a PET scan - repeat chest CT in January and due for another later this summer.   He is on very little of a CHF regimen. Tells me he could not tolerate ACE/ARB due to marked hypotension. Only on low dose Coreg and Lasix. Now back to just one Lasix a day. His symptoms have improved over the last day or so.    Past Medical History:  Diagnosis Date  .  Arthritis    lower back  . Cardiomyopathy ([place])    EF originally 20%-now 45%, no CAD on cath  . Chronic combined systolic and diastolic CHF (congestive heart failure) ([place])    a. 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 Reported h/o cath in 2008 without significant CAD.  Marland Kitchen Hyperlipidemia few yrs ago  . Hypertension   . Hypertriglyceridemia   . PAF (paroxysmal atrial fibrillation) ([place])    a. Postop 2008. b. recurrent afib 08/2014 with LAA clot noted on TEE. EF was down again at 25%. He was  anticoagulated with Eliquis and placed on Amiodarone.He ultimately underwent TEE-DCCV 08/2014.  . S/P aortic valve replacement with bioprosthetic valve    a. bioprosthetic AVR in 05/2007 in [place].bovine valve  . Type II diabetes mellitus ([place])     Past Surgical History:  Procedure Laterality Date  . AMPUTATION Right 07/29/2014   Procedure: AMPUTATION RIGHT LONG FINGER;  Surgeon: [name], MD;  Location: [place];  Service: Orthopedics;  Laterality: Right;  . AMPUTATION Right 01/13/2016   Procedure: RIGHT BELOW KNEE AMPUTATION;  Surgeon: Newt Minion, MD;  Location: [place];  Service: Orthopedics;  Laterality: Right;  . AORTIC VALVE REPLACEMENT  05/2007   with tissue graft  . APPLICATION OF WOUND VAC Right 01/13/2016   Procedure: APPLICATION OF WOUND VAC;  Surgeon: Newt Minion, MD;  Location: [place];  Service: Orthopedics;  Laterality: Right;  . CARDIAC CATHETERIZATION  "several"  . CARDIAC VALVE REPLACEMENT    . CARDIOVERSION N/A 07/23/2014   Procedure: CARDIOVERSION;  Surgeon: Josue Hector, MD;  Location: [place];  Service: Cardiovascular;  Laterality: N/A;  . CARDIOVERSION N/A 09/01/2014   Procedure: CARDIOVERSION;  Surgeon: [name], MD;  Location: [place];  Service: Cardiovascular;  Laterality: N/A;  . COLONOSCOPY WITH PROPOFOL N/A 05/29/2017   Procedure: COLONOSCOPY WITH PROPOFOL;  Surgeon: Wilford Corner, MD;  Location: [facility];  Service: Endoscopy;  Laterality: N/A;  . I&D EXTREMITY Right 05/05/2014   Procedure: IRRIGATION AND DEBRIDEMENT EXTREMITY;  Surgeon: [name], MD;  Location: [place];  Service: Orthopedics;  Laterality: Right;  . LEFT HEART CATH AND CORONARY ANGIOGRAPHY N/A 05/17/2017   Procedure: LEFT HEART CATH AND CORONARY ANGIOGRAPHY;  Surgeon: Martinique, Peter M, MD;  Location: [place] [facility];  Service: Cardiovascular;  Laterality: N/A;  . TEE WITHOUT CARDIOVERSION N/A 07/23/2014   Procedure: TRANSESOPHAGEAL ECHOCARDIOGRAM (TEE);   Surgeon: Josue Hector, MD;  Location: [place];  Service: Cardiovascular;  Laterality: N/A;  . TEE WITHOUT CARDIOVERSION N/A 09/01/2014   Procedure: TRANSESOPHAGEAL ECHOCARDIOGRAM (TEE);  Surgeon: [name], MD;  Location: [facility];  Service: Cardiovascular;  Laterality: N/A;  . TEE WITHOUT CARDIOVERSION N/A 04/17/2017   Procedure: TRANSESOPHAGEAL ECHOCARDIOGRAM (TEE);  Surgeon: Jerline Pain, MD;  Location: [facility] ENDOSCOPY;  Service: Cardiovascular;  Laterality: N/A;  . TONSILLECTOMY  1954     Medications: Current Meds  Medication Sig  . amiodarone (PACERONE) 100 MG tablet Take 1 tablet (100 mg total) by mouth daily.  Marland Kitchen amoxicillin (AMOXIL) 875 MG tablet TAKE 1 TABLET BY MOUTH EVERY 12 HOURS FOR 10 DAYS  . apixaban (ELIQUIS) 5 MG TABS tablet Take 1 tablet (5 mg total) by mouth 2 (two) times daily.  Marland Kitchen atorvastatin (LIPITOR) 10 MG tablet Take 10 mg by mouth every morning.  . carvedilol (COREG) 6.25 MG tablet Take 1 tablet (6.25 mg total) by mouth  2 (two) times daily with a meal.  . empagliflozin (JARDIANCE) 10 MG TABS tablet Take 10 mg by mouth daily.   . furosemide (LASIX) 40 MG tablet Take 1 tablet (40 mg total) by mouth daily.  . insulin regular (NOVOLIN R) 100 units/mL injection Inject 0.03 mLs (3 Units total) into the skin 3 (three) times daily before meals. (Patient taking differently: Inject 3-8 Units into the skin See admin instructions. Inject 3 units SQ before breakfast and lunch, inject 8 units SQ before supper)  . ipratropium (ATROVENT) 0.06 % nasal spray INHALE 2 SPRAYS IN EACH NOSTRIL AS NEEDED TWICE A DAY NASALLY  . Multiple Vitamin (MULTIVITAMIN) tablet Take 1 tablet by mouth daily.  . ondansetron (ZOFRAN ODT) 4 MG disintegrating tablet Take 1 tablet (4 mg total) by mouth every 8 (eight) hours as needed for nausea or vomiting.  Marland Kitchen TOUJEO SOLOSTAR 300 UNIT/ML SOPN Inject 15 Units into the skin daily. (Patient taking differently: Inject 28 Units into the skin at bedtime. )    . [DISCONTINUED] atorvastatin (LIPITOR) 10 MG tablet TAKE 1 TABLET EVERY MORNING (Patient taking differently: TAKE 1 TABLET BY MOUTH EVERY MORNING)     Allergies: Allergies  Allergen Reactions  . Amoxicill-Clarithro-Omeprazole Diarrhea and Nausea And Vomiting  . Dulaglutide Nausea And Vomiting    Social History: The patient  reports that he has never smoked. He has never used smokeless tobacco. He reports that he drinks alcohol. He reports that he does not use drugs.   Family History: The patient's family history includes Colon cancer in his father; Congenital heart disease in his other; Diabetes in his father and maternal grandmother; Heart disease in his maternal grandfather; Heart murmur in his child; Liver disease in his brother.   Review of Systems: Please see the history of present illness.   Otherwise, the review of systems is positive for none.   All other systems are reviewed and negative.   Physical Exam: VS:  BP 120/80 (BP Location: Left Arm, Patient Position: Sitting, Cuff Size: Normal)   Pulse 81   Ht 6' (1.829 m)   Wt 242 lb 6.4 oz (110 kg)   BMI 32.88 kg/m  .  BMI Body mass index is 32.88 kg/m.  Wt Readings from Last 3 Encounters:  02/18/18 242 lb 6.4 oz (110 kg)  10/30/17 250 lb (113.4 kg)  09/27/17 250 lb (113.4 kg)    General: Pleasant. Obese. Alert and in no acute distress.   HEENT: Normal.  Neck: Supple, no JVD, carotid bruits, or masses noted.  Cardiac: Regular rate and rhythm. No murmurs, rubs, or gallops. No edema. R BKA.  Respiratory:  Lungs are clear to auscultation bilaterally with normal work of breathing.  GI: Soft and nontender.  MS: No deformity or atrophy. Gait and ROM intact. Using a walker.   Skin: Warm and dry. Color is normal.  Neuro:  Strength and sensation are intact and no gross focal deficits noted.  Psych: Alert, appropriate and with normal affect.   LABORATORY DATA:  EKG:  EKG is ordered today. This demonstrates NSR with 1st  degree AV block with LBBB.  Lab Results  Component Value Date   WBC 10.4 05/18/2017   HGB 14.5 05/18/2017   HCT 44.6 05/18/2017   PLT 230 05/18/2017   GLUCOSE 219 (H) 10/01/2017   CHOL 127 05/24/2015   TRIG 123.0 05/24/2015   HDL 38.70 (L) 05/24/2015   LDLCALC 64 05/24/2015   ALT 19 05/14/2017   AST 27 05/14/2017  NA 137 10/01/2017   K 5.0 10/01/2017   CL 101 10/01/2017   CREATININE 1.30 10/01/2017   BUN 22 10/01/2017   CO2 28 10/01/2017   TSH 2.270 09/26/2016   INR 1.63 (H) 01/12/2016   HGBA1C 8.4 (H) 05/15/2017   MICROALBUR 11.3 (H) 05/24/2015       BNP (last 3 results) No results for input(s): BNP in the last 8760 hours.  ProBNP (last 3 results) No results for input(s): PROBNP in the last 8760 hours.   Other Studies Reviewed Today:  CT CHEST IMPRESSION 09/2017: 1. Grossly unchanged cystic structure within the anterior mediastinum. Recommend follow-up chest CT in 6 months to assess for interval change in size/stability. 2. No acute process within the chest. 3. Aortic Atherosclerosis (ICD10-I70.0).   Electronically Signed   By: Lovey Newcomer M.D.   On: 10/03/2017 12:51     LEFT HEART CATH AND CORONARY ANGIOGRAPHY 04/2017  Conclusion     LV end diastolic pressure is normal.   1. No significant CAD 2. Normal LVEDP 3. AV gradient approximately 10 mm Hg  Plan: Medical management.     TTE Echo Study Conclusions 04/2017  - Left ventricle: LVEF is approximately 30 to 35% with diffuse   hypokinesis, worse in the anterior wall and inferior akinesis.   Since report fo July 2018 this is mildly improved. The cavity   size was mildly dilated. Wall thickness was increased in a   pattern of mild LVH. - Aortic valve: AV is thickened with some restricted motion,   particularly R coronary cusp Valve is well seated Peak and mean   gradients through the valve are 22 and 14 mm Hg respectively This   is not significantly changed from echo report of July  2018. - Mitral valve: Calcified annulus. Mildly thickened, mildly   calcified leaflets . - Right ventricle: Systolic function was mildly reduced.   TEE Study Conclusions 04/2017  - Left ventricle: Systolic function was severely reduced. The   estimated ejection fraction was in the range of 20% to 25%.   Diffuse hypokinesis. - Aortic valve: A prosthesis was present and mildly to moderately   calcified. The prosthesis had a mildly reduced range of motion.   The sewing ring appeared normal, had no rocking motion, and   showed no evidence of dehiscence. There is a lucency in the 10 to   12 o&'clock position in the short axis view that has been present   on prior TEE and TTE. No change. No vegetations noted. There was   trivial regurgitation originating from the central coaptation   point. - Aorta: The aorta was mildly dilated. Aortic root dimension: 42 mm   (ED). - Mitral valve: No evidence of vegetation. There was mild   regurgitation. - Left atrium: No evidence of thrombus in the appendage. - Right atrium: No evidence of thrombus in the atrial cavity or   appendage. - Atrial septum: No defect or patent foramen ovale was identified. - Tricuspid valve: No evidence of vegetation. There was mild   regurgitation directed eccentrically. - Pulmonic valve: No evidence of vegetation. - Superior vena cava: The study excluded a thrombus.  Impressions:  - There was no evidence of a vegetation. EF is reduced 20-20% as   was seen on prior TEE. Bioprosthetic aortic valve is more   calcified when compared to prior study. No evidence of severe   stenosis or endocarditis.  Recommendations:  He may proceed with shoulder surgery with moderate cardiac risk (  reduced EF 20-25%). Be careful with fluid administration.    Assessment/Plan:  1. NICM - EF of 20 to 25% by TEE and 30 to 35% by TTE - these studies were back in August of 2018 - with current presentation, sounds like he has had an  acute exacerbation - most likely getting too much salt and triggered by the URI. Needs labs today. Discussed with Dr. Marlou Porch here today - will increase Lasix to 60 mg for 3 days, get echo updated. We may be moving more towards an end stage picture. He is not on ideal therapy due to history of marked hypotension (down in the 50 to 78'H systolic previously noted). Salt restriction is imperative.    2. Prior AVR from 2009 - getting echo updated.   3. Chronic combined systolic and diastolic HF - see above.   4. PAF - remains on amiodarone - he is in sinus today - chronic LBBB  5. High risk medicine - needs labs today.   6. Chronic anticoagulation - on Eliquis - no active bleeding noted.   7. Dilated aortic root - may need to consider CTA but he appears to be a poor candidate to me if surgical intervention was warranted - this was not discussed today.   8. Tendency towards hypotension - BP ok at this time.   9. Abnormal CT - followed previously by pulmonary - not felt to be related to amiodarone toxicity - last scan was in January and he is to have repeat later this summer.   10. Obesity - continues to struggle - he is not really able to exercise.   11. History of left atrial appendage thrombus - on chronic anticoagulation.  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 He has seen Dr. Roxan Hockey in the past - had CT in January and due for repeat later this summer - will make Dr. Roxan Hockey aware.   13. Carotid artery disease - followed by [facility] - last study was in June of 2018 - noted recall in system for 2020. Seen by Dr. Donzetta Matters.   Current medicines are reviewed with the patient today.  The patient does not have concerns regarding medicines other than what has been noted above.  The following changes have been made:  See above.  Labs/ tests ordered today include:    Orders Placed This Encounter  Procedures  . Basic metabolic panel  . CBC  . Hepatic function panel  . TSH  . Pro  b natriuretic peptide (BNP)  . EKG 12-Lead     Disposition:   FU with Dr. Marlou Porch in one month.    Patient is agreeable to this plan and will call if any problems develop in the interim.   SignedTruitt Merle, NP  02/18/2018 9:37 AM  [facility] [address] [place], [place]  [address] Phone: [phone] Fax: [phone]

## 2018-02-19 ENCOUNTER — Other Ambulatory Visit: Payer: Self-pay | Admitting: *Deleted

## 2018-02-19 DIAGNOSIS — E1165 Type 2 diabetes mellitus with hyperglycemia: Secondary | ICD-10-CM | POA: Diagnosis not present

## 2018-02-19 DIAGNOSIS — Z7901 Long term (current) use of anticoagulants: Secondary | ICD-10-CM | POA: Diagnosis not present

## 2018-02-19 DIAGNOSIS — Z794 Long term (current) use of insulin: Secondary | ICD-10-CM | POA: Diagnosis not present

## 2018-02-19 DIAGNOSIS — Z89511 Acquired absence of right leg below knee: Secondary | ICD-10-CM | POA: Diagnosis not present

## 2018-02-19 DIAGNOSIS — R2681 Unsteadiness on feet: Secondary | ICD-10-CM | POA: Diagnosis not present

## 2018-02-20 DIAGNOSIS — R2681 Unsteadiness on feet: Secondary | ICD-10-CM | POA: Diagnosis not present

## 2018-02-20 DIAGNOSIS — Z89511 Acquired absence of right leg below knee: Secondary | ICD-10-CM | POA: Diagnosis not present

## 2018-02-20 DIAGNOSIS — E1165 Type 2 diabetes mellitus with hyperglycemia: Secondary | ICD-10-CM | POA: Diagnosis not present

## 2018-02-20 DIAGNOSIS — Z7901 Long term (current) use of anticoagulants: Secondary | ICD-10-CM | POA: Diagnosis not present

## 2018-02-20 DIAGNOSIS — Z794 Long term (current) use of insulin: Secondary | ICD-10-CM | POA: Diagnosis not present

## 2018-02-21 ENCOUNTER — Other Ambulatory Visit: Payer: Self-pay | Admitting: Nurse Practitioner

## 2018-02-21 ENCOUNTER — Other Ambulatory Visit (HOSPITAL_COMMUNITY): Payer: Medicare HMO

## 2018-02-21 ENCOUNTER — Ambulatory Visit (HOSPITAL_COMMUNITY): Payer: Medicare HMO | Attending: Internal Medicine

## 2018-02-21 ENCOUNTER — Other Ambulatory Visit: Payer: Self-pay

## 2018-02-21 DIAGNOSIS — E119 Type 2 diabetes mellitus without complications: Secondary | ICD-10-CM | POA: Insufficient documentation

## 2018-02-21 DIAGNOSIS — I428 Other cardiomyopathies: Secondary | ICD-10-CM | POA: Diagnosis not present

## 2018-02-21 DIAGNOSIS — I5022 Chronic systolic (congestive) heart failure: Secondary | ICD-10-CM

## 2018-02-21 DIAGNOSIS — I480 Paroxysmal atrial fibrillation: Secondary | ICD-10-CM | POA: Diagnosis not present

## 2018-02-21 DIAGNOSIS — I348 Other nonrheumatic mitral valve disorders: Secondary | ICD-10-CM | POA: Insufficient documentation

## 2018-02-21 DIAGNOSIS — E785 Hyperlipidemia, unspecified: Secondary | ICD-10-CM | POA: Insufficient documentation

## 2018-02-21 DIAGNOSIS — I110 Hypertensive heart disease with heart failure: Secondary | ICD-10-CM | POA: Insufficient documentation

## 2018-02-21 DIAGNOSIS — Z953 Presence of xenogenic heart valve: Secondary | ICD-10-CM | POA: Diagnosis not present

## 2018-02-21 DIAGNOSIS — I5042 Chronic combined systolic (congestive) and diastolic (congestive) heart failure: Secondary | ICD-10-CM | POA: Diagnosis not present

## 2018-03-11 DIAGNOSIS — Z961 Presence of intraocular lens: Secondary | ICD-10-CM | POA: Diagnosis not present

## 2018-03-11 DIAGNOSIS — E119 Type 2 diabetes mellitus without complications: Secondary | ICD-10-CM | POA: Diagnosis not present

## 2018-03-12 ENCOUNTER — Telehealth: Payer: Self-pay | Admitting: *Deleted

## 2018-03-12 NOTE — Telephone Encounter (Signed)
Faxed to [phone] [place] ICD 10 code for echo, # is [phone].

## 2018-03-25 DIAGNOSIS — Z794 Long term (current) use of insulin: Secondary | ICD-10-CM | POA: Diagnosis not present

## 2018-03-25 DIAGNOSIS — E782 Mixed hyperlipidemia: Secondary | ICD-10-CM | POA: Diagnosis not present

## 2018-03-25 DIAGNOSIS — E1165 Type 2 diabetes mellitus with hyperglycemia: Secondary | ICD-10-CM | POA: Diagnosis not present

## 2018-04-04 ENCOUNTER — Ambulatory Visit (INDEPENDENT_AMBULATORY_CARE_PROVIDER_SITE_OTHER): Payer: Medicare HMO | Admitting: Cardiology

## 2018-04-04 ENCOUNTER — Encounter: Payer: Self-pay | Admitting: Cardiology

## 2018-04-04 VITALS — BP 126/74 | HR 69 | Ht 72.0 in | Wt 252.0 lb

## 2018-04-04 DIAGNOSIS — I77810 Thoracic aortic ectasia: Secondary | ICD-10-CM | POA: Diagnosis not present

## 2018-04-04 DIAGNOSIS — Z79899 Other long term (current) drug therapy: Secondary | ICD-10-CM

## 2018-04-04 DIAGNOSIS — I480 Paroxysmal atrial fibrillation: Secondary | ICD-10-CM

## 2018-04-04 DIAGNOSIS — I5022 Chronic systolic (congestive) heart failure: Secondary | ICD-10-CM

## 2018-04-04 NOTE — Patient Instructions (Signed)
Medication Instructions:  The current medical regimen is effective;  continue present plan and medications.  Follow-Up: Follow up in 3 months with Truitt Merle, NP  Follow up in 6 months with Dr. Marlou Porch.  You will receive a letter in the mail 2 months before you are due.  Please call us when you receive this letter to schedule your follow up appointment.  If you need a refill on your cardiac medications before your next appointment, please call your pharmacy.  Thank you for choosing [facility]!!

## 2018-04-04 NOTE — Progress Notes (Signed)
Cardiology Office Note   Date:  04/04/2018   ID:  Thomas Wright, DOB [birth year], MRN [id number]  PCP:  Thomas Ruff, MD  Cardiologist:   Thomas Furbish, MD       History of Present Illness: Thomas Wright is a 72 y.o. 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 Earnstine Regal in [facility] [place] looked good, nonvalvular atrial fibrillation, diabetes difficult to control who presents for follow-up of diastolic heart failure, chronic, hospitalization in early August 2016 with 20 pound weight gain. Interestingly, his BNP was normal. No changes on chest CT scan to suggest amiodarone toxicity. He was down 3.4 L at discharge and pulmonary function studies demonstrated moderate to severe restriction consistent with fibrosis or interstitial inflammations. Pulmonary referral was made.  Amputation BKA. Dr. Sharol Wright, Small cut at home which then led to gangrene   I reviewed pulmonary note. There was significant exposure in the 60s and 70s to coal dust. 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 Autoimmune serologies were drawn. Sedimentation rate was normal at 11.  Previously he had battled with hypotension at home SBPs 50s-60s.  Lightheaded but no syncope, chest pain, palpitations, dyspnea.   He called in and was advised to decrease Coreg to 6.25mg  BID.  He has only been taking this when SBP > 125 and DBP > 85 he takes Coreg.  He has only been taking Coreg twice daily because BPs have been mostly lower than this.    His weight has fluctuated.  He reports abdominal distention but denies PND or LE edema.  He has stable 2 pillow orthopnea.  He reports early satiety in past year.  He doubles his Lasix about twice weekly when he feels his fluid is up (when it is difficult to button his pants).     Aortic valve was placed in 05/2007 with a 29 mm bioprosthetic Edwards valve. This was  performed in [place]. At the time his ejection fraction was 30% on cardiac catheterization however current ejection fraction appears to be 45% from 12/24/11 with aortic root dilatation of 4.5 cm.  He has been on amiodarone since his surgery. Previously had stopped it in 2013. 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 His atrial fibrillation came with a finger infection exacerbated by uncontrolled diabetes. He has had history as well of diabetic ketoacidosis, perirectal abscess.  09/26/16  - no strength, moderate. Fatigued. Weak. [name] from [facility] moved in. PT not helping him. Felt a bit depressed during the holidays. No significant chest pain or shortness of breath. Right below-knee amputation, getting around. Last step at home is a half an inch higher and is somewhat of a challenge for him. Thomas Wright is going to set him up with Silver sneakers.   04/01/17  - He still having fatigue. Shortness of breath with activity. No chest pain. 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 He has a right shoulder surgery planned and prior to anesthesia I would like to obtain a stress test and echocardiogram. No syncope, no bleeding  04/04/18  -   Past Medical History:  Diagnosis Date  . Arthritis    lower back  . Cardiomyopathy ([place])    EF originally 20%-now 45%, no CAD on cath  . Chronic combined systolic and diastolic CHF (congestive heart  failure) ([place])    a. 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 Reported h/o cath in 2008 without significant CAD.  Thomas Wright Hyperlipidemia few yrs ago  . Hypertension   . Hypertriglyceridemia   . PAF (paroxysmal atrial fibrillation) ([place])    a. Postop 2008. b. recurrent afib 08/2014 with LAA clot noted on TEE. EF was down again at 25%. He was  anticoagulated with Eliquis and placed on Amiodarone.He ultimately underwent TEE-DCCV 08/2014.  . S/P aortic valve replacement with bioprosthetic valve    a. bioprosthetic AVR in 05/2007 in [place].bovine valve  . Type II diabetes mellitus (Thomas Wright)     Past Surgical History:  Procedure Laterality Date  . AMPUTATION Right 07/29/2014   Procedure: AMPUTATION RIGHT LONG FINGER;  Surgeon: [name], MD;  Location: [place];  Service: Orthopedics;  Laterality: Right;  . AMPUTATION Right 01/13/2016   Procedure: RIGHT BELOW KNEE AMPUTATION;  Surgeon: Newt Minion, MD;  Location: [place];  Service: Orthopedics;  Laterality: Right;  . AORTIC VALVE REPLACEMENT  05/2007   with tissue graft  . APPLICATION OF WOUND VAC Right 01/13/2016   Procedure: APPLICATION OF WOUND VAC;  Surgeon: Newt Minion, MD;  Location: [place];  Service: Orthopedics;  Laterality: Right;  . CARDIAC CATHETERIZATION  "several"  . CARDIAC VALVE REPLACEMENT    . CARDIOVERSION N/A 07/23/2014   Procedure: CARDIOVERSION;  Surgeon: Josue Hector, MD;  Location: [place];  Service: Cardiovascular;  Laterality: N/A;  . CARDIOVERSION N/A 09/01/2014   Procedure: CARDIOVERSION;  Surgeon: Thomas Furbish, MD;  Location: [place];  Service: Cardiovascular;  Laterality: N/A;  . COLONOSCOPY WITH PROPOFOL N/A 05/29/2017   Procedure: COLONOSCOPY WITH PROPOFOL;  Surgeon: Wilford Corner, MD;  Location: [facility];  Service: Endoscopy;  Laterality: N/A;  . I&D EXTREMITY Right 05/05/2014   Procedure: IRRIGATION AND DEBRIDEMENT EXTREMITY;  Surgeon: [name], MD;  Location: [place];  Service: Orthopedics;  Laterality: Right;  . LEFT HEART CATH AND CORONARY ANGIOGRAPHY N/A 05/17/2017   Procedure: LEFT HEART CATH AND CORONARY ANGIOGRAPHY;  Surgeon: Martinique, Peter M, MD;  Location: [place] [facility];  Service: Cardiovascular;  Laterality: N/A;  . TEE WITHOUT CARDIOVERSION N/A 07/23/2014   Procedure: TRANSESOPHAGEAL ECHOCARDIOGRAM (TEE);   Surgeon: Josue Hector, MD;  Location: [place];  Service: Cardiovascular;  Laterality: N/A;  . TEE WITHOUT CARDIOVERSION N/A 09/01/2014   Procedure: TRANSESOPHAGEAL ECHOCARDIOGRAM (TEE);  Surgeon: Thomas Furbish, MD;  Location: [facility] END[facility];  Service: Cardiovascular;  Laterality: N/A;  . TEE WITHOUT CARDIOVERSION N/A 04/17/2017   Procedure: TRANSESOPHAGEAL ECHOCARDIOGRAM (TEE);  Surgeon: Jerline Pain, MD;  Location: [facility] ENDOSCOPY;  Service: Cardiovascular;  Laterality: N/A;  . TONSILLECTOMY  1952     Current Outpatient Medications  Medication Sig Dispense Refill  . amiodarone (PACERONE) 100 MG tablet Take 1 tablet (100 mg total) by mouth daily. 90 tablet 2  . amoxicillin (AMOXIL) 875 MG tablet TAKE 1 TABLET BY MOUTH EVERY 12 HOURS FOR 10 DAYS  0  . apixaban (ELIQUIS) 5 MG TABS tablet Take 1 tablet (5 mg total) by mouth 2 (two) times daily. 180 tablet 2  . atorvastatin (LIPITOR) 10 MG tablet Take 10 mg by mouth every morning.    . carvedilol (COREG) 6.25 MG tablet Take 1 tablet (6.25 mg total) by mouth 2 (two) times daily with a meal. 180 tablet 1  . empagliflozin (JARDIANCE) 10 MG TABS tablet Take  10 mg by mouth daily.     . furosemide (LASIX) 40 MG tablet Take 1 tablet (40 mg total) by mouth daily. 90 tablet 1  . Insulin Glargine (TOUJEO SOLOSTAR) 300 UNIT/ML SOPN Inject 28 Units into the skin at bedtime.    . insulin regular (NOVOLIN R) 100 units/mL injection Inject 0.03 mLs (3 Units total) into the skin 3 (three) times daily before meals. (Patient taking differently: Inject 3-8 Units into the skin See admin instructions. Inject 3 units SQ before breakfast and lunch, inject 8 units SQ before supper) 20 mL 3  . Multiple Vitamin (MULTIVITAMIN) tablet Take 1 tablet by mouth daily.     No current facility-administered medications for this visit.     Allergies:   Amoxicill-clarithro-omeprazole and Dulaglutide    Social History:  The patient  reports that he has never smoked. He has never  used smokeless tobacco. He reports that he drinks alcohol. He reports that he does not use drugs.   Family History:  The patient's family history includes Colon cancer in his father; Congenital heart disease in his other; Diabetes in his father and maternal grandmother; Heart disease in his maternal grandfather; Heart murmur in his child; Liver disease in his brother.    ROS:  Please see the history of present illness.   Otherwise, review of systems are positive for none.   All other systems are reviewed and negative.    PHYSICAL EXAM: VS:  BP 126/74   Pulse 69   Ht 6' (1.829 m)   Wt 252 lb (114.3 kg)   SpO2 96%   BMI 34.18 kg/m  , BMI Body mass index is 34.18 kg/m. GEN: Well nourished, well developed, in no acute distress  HEENT: normal  Neck: no JVD, carotid bruits, or masses Cardiac: RRR; no murmurs, rubs, or gallops,no edema  Respiratory:  clear to auscultation bilaterally, normal work of breathing GI: soft, nontender, mild distended, + BS MS: no deformity or atrophy, RIGHT BKA Skin: warm and dry, no rash Neuro:  Grossly intact Psych: euthymic mood, full affect No change  EKG: Today 04/01/17-sinus rhythm first degree AV block and left bundle branch block heart rate 67 bpm. No change from prior in 2017.    Recent Labs: 02/18/2018: ALT 19; BUN 23; Creatinine, Ser 1.55; Hemoglobin 14.5; NT-Pro BNP 1,634; Platelets 447; Potassium 5.0; Sodium 134; TSH 1.510    Lipid Panel    Component Value Date/Time   CHOL 127 05/24/2015 0848   TRIG 123.0 05/24/2015 0848   HDL 38.70 (L) 05/24/2015 0848   CHOLHDL 3 05/24/2015 0848   VLDL 24.6 05/24/2015 0848   LDLCALC 64 05/24/2015 0848      Wt Readings from Last 3 Encounters:  04/04/18 252 lb (114.3 kg)  02/18/18 242 lb 6.4 oz (110 kg)  10/30/17 250 lb (113.4 kg)      Other studies Reviewed: Additional studies/ records that were reviewed today include: labs, radiology, office notes. Review of the above records demonstrates: as  above   ECHO 07/22/14 - EF 25-30% (setting of AFIB RVR)  ECHO 12/08/13: - 45% to 50%.  - Aortic valve: A bioprosthesis was present. Trivial regurgitation. - Aortic root: The aortic root was moderately dilated. - Mitral valve: Calcified annulus. - Left atrium: The atrium was moderately dilated. Impressions:  ECHO 04/19/15: - Left ventricle: The cavity size was normal. Systolic function was  normal. The estimated ejection fraction was in the range of 50%  to 55%. Wall motion was normal; there  were no regional wall  motion abnormalities. Doppler parameters are consistent with  abnormal left ventricular relaxation (grade 1 diastolic  dysfunction). Doppler parameters are consistent with elevated  ventricular end-diastolic filling pressure. - Ventricular septum: Septal motion showed paradox. - Aortic valve: Bioprosthetic valve sits well in the aortic  position. There is mild central aortic regurgitation and no  paravalvular leak. Normal transaortic gradients. Transvalvular  velocity was within the normal range. There was no stenosis.  There was mild regurgitation. Valve area (VTI): 3.58 cm^2. Valve  area (Vmax): 3.57 cm^2. Valve area (Vmean): 3.24 cm^2. - Mitral valve: Calcified annulus. Mildly thickened leaflets .  There was mild regurgitation. Valve area by continuity equation  (using LVOT flow): 4 cm^2. - Left atrium: The atrium was mildly dilated. - Right ventricle: The cavity size was mildly dilated. Wall  thickness was normal. - Right atrium: The atrium was mildly dilated. - Pulmonary arteries: Systolic pressure was within the normal  range.  Impressions:  - There is no significant difference when compared tio the prior  study from 11/22/2014.  ECHO 02/21/18: - Left ventricle: The cavity size was normal. There was moderate   concentric hypertrophy. Systolic function was severely reduced.   The estimated ejection fraction was in the range of 25% to 30%.    Diffuse hypokinesis. Doppler parameters are consistent with   abnormal left ventricular relaxation (grade 1 diastolic   dysfunction). LV filling pressure is elevated. - Aortic valve: s/p bioprosthetic AVR wtih moderate calcific   stenosis. There was trivial regurgitation. Mean gradient (S): 16   mm Hg. Peak gradient (S): 25 mm Hg. Valve area (Vmax): 1.14 cm^2.   Valve area (Vmean): 1.17 cm^2. - Aorta: Dilated aortic root. Aortic root dimension: 42 mm (ED). - Mitral valve: Calcified annulus. Mildly thickened leaflets . - Left atrium: The atrium was normal in size. - Tricuspid valve: There was trivial regurgitation. - Pulmonary arteries: PA peak pressure: 18 mm Hg (S) + RAP.  Impressions:  - Compared to a prior study in 04/2017, the LVEF is lower at 25-30%   with elevated LV filling pressure. The mean gradient across the   bioprosthetic AVR is higher at 16 mmHg, consistent with moderate   stenosis.  CTA 12/14/13 -  Mild dilatation of the proximal aorta at the sinuses of Valsalva, measuring 4.2 cm in estimated diameter. The rest of the thoracic aorta is of normal caliber. No dissection is identified. Scattered calcified plaque is identified in the distribution of the LAD.   Nuclear stress test 11/2012-no ischemia, ejection fraction 42%  ASSESSMENT AND PLAN:  Chronic systolic and diastolic heart failure -Ejection fraction once again is in the 25% range, this was previously seen in 2015 in the setting of A. fib with RVR.  He has fluctuated over the past few years.  At prior visit with Truitt Merle, NP on 02/18/2018 his Lasix was increased and he states that he was able to pull off a good amount of fluid.  He is feeling better.  Remember we have to be very careful with hypotension.  Because of this, I do not think he would be a good candidate for Entresto.  We will continue with her current regimen now that he is showing stability.  He also showed no coronary disease in 2009  catheterization.  Paroxysmal atrial fibrillation - Currently normal sinus rhythm on low-dose amiodarone.  No change.  ALT normal 19 on 02/18/2018 as well as TSH, hemoglobin 14.5.  Chronic anticoagulation -Continue with Eliquis 5  mg twice a day.  No bleeding.  Dilated aortic root - Largest measurement was 45 mm previously.  Last echocardiogram showed 42 mm.  He is having a CT scan for thymoma soon and we will be able to get a rough estimate of his aortic diameter as well.  He has already had an aortic valve replacement.  We discussed potential severity/risk of aortic root repair.  If and when his diameter increases, we would need to have a long talk about risks versus benefits of such a high risk surgery. -Avoid Cipro.  Continue with good blood pressure control.  Avoid Valsalva-like maneuvers.  Left bundle branch block - Chronic.  No syncope.  Transient hypotension - Blood pressures have been as low as 60 in the past.  Being very careful.  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 Roxan Hockey in the past.  Carotid artery disease - Dr. Donzetta Matters.  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   Current medicines are reviewed at length with the patient today.  The patient does not have concerns regarding medicines.  The following changes have been made:  Decrease amiodarone  Labs/ tests ordered today include:   No orders of the defined types were placed in this encounter.    Disposition:   Lori 3 months, me 6 months  Signed, Thomas Furbish, MD  04/04/2018 12:00 PM    [address], [place], [place]  [address] Phone: [phone]; Fax: [phone]

## 2018-04-09 ENCOUNTER — Ambulatory Visit (INDEPENDENT_AMBULATORY_CARE_PROVIDER_SITE_OTHER)
Admission: RE | Admit: 2018-04-09 | Discharge: 2018-04-09 | Disposition: A | Discharge status: Home / Self Care | Payer: Medicare HMO | Source: Ambulatory Visit | Attending: Pulmonary Disease | Admitting: Pulmonary Disease

## 2018-04-09 DIAGNOSIS — J9811 Atelectasis: Secondary | ICD-10-CM | POA: Diagnosis not present

## 2018-04-09 DIAGNOSIS — R0602 Shortness of breath: Secondary | ICD-10-CM

## 2018-04-10 ENCOUNTER — Telehealth: Payer: Self-pay | Admitting: Pulmonary Disease

## 2018-04-10 DIAGNOSIS — R0602 Shortness of breath: Secondary | ICD-10-CM

## 2018-04-10 NOTE — Telephone Encounter (Signed)
Patient returned call, CB is [phone].

## 2018-04-10 NOTE — Telephone Encounter (Signed)
Notes recorded by Marshell Garfinkel, MD on 04/09/2018 at 5:55 PM EDT Please let patient know that CT shows a lesion in the chest that has grown slowly over a period of 4 years and an enlarged aorta. Refer to cardiothoracic surgery for evaluation.  Attempted to call pt but unable to reach him. Left message for pt to return call x1.

## 2018-04-10 NOTE — Telephone Encounter (Signed)
Called and spoke with patient regarding results.  Informed the patient of results and recommendations today. Advised pt placed referral for cardiothoracic surgery for enlarged aorta today. Pt verbalized understanding and denied any questions or concerns at this time.  Nothing further needed.

## 2018-04-14 ENCOUNTER — Telehealth: Payer: Self-pay | Admitting: Cardiology

## 2018-04-14 NOTE — Telephone Encounter (Signed)
New Message    Pt states he was referred to a Cardiac surgeon and wants to check with Dr. Marlou Porch first. Please call

## 2018-04-14 NOTE — Telephone Encounter (Signed)
Pt called to let Dr. Marlou Porch know that he has been referred by Pulmonary to see Dr. Roxan Hockey re: his recent CT for a mediastinal mass. Advised him that I will forward to Dr. Marlou Porch.

## 2018-04-15 ENCOUNTER — Ambulatory Visit: Payer: Medicare HMO | Admitting: Thoracic Surgery (Cardiothoracic Vascular Surgery)

## 2018-04-15 ENCOUNTER — Other Ambulatory Visit: Payer: Self-pay

## 2018-04-15 ENCOUNTER — Encounter: Payer: Self-pay | Admitting: Thoracic Surgery (Cardiothoracic Vascular Surgery)

## 2018-04-15 VITALS — BP 111/58 | HR 74 | Resp 16 | Ht 72.0 in | Wt 252.0 lb

## 2018-04-15 DIAGNOSIS — J9859 Other diseases of mediastinum, not elsewhere classified: Secondary | ICD-10-CM | POA: Diagnosis not present

## 2018-04-15 NOTE — Progress Notes (Signed)
[address]       [place] [address]             [phone]    HPI: Mr. 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  Abubakr Wieman is a 72 year old man with a history of bioprosthetic aortic valve replacement in 2008, paroxysmal atrial fibrillation, cardiomyopathy, chronic combined systolic and diastolic congestive heart failure with ejection fraction of 25%, hypertension, hyperlipidemia, type 2 diabetes, arthritis, right below-knee amputation, and chronic dyspnea.  He was being evaluated for chronic dyspnea with a high-resolution chest CT.  There is no evidence of interstitial lung disease.  He did have a 1.7 cm anterior mediastinal "mass."  That had been present previously but had grown slightly.  I saw him in August 2018.  We scheduled a PET CT.  That showed the nodule was photopenic.  He then was admitted to the hospital and lost to follow-up with me.  He did undergo cardiac catheterization during that hospitalization which showed normal coronaries.  He had another CT in January of this year which showed no change in the anterior mediastinal cyst.  He recently had a follow-up CT which showed interval growth from 1.3 x 1.7 cm to 1.5 x 2 cm.  He was having trouble with fluid retention a few weeks ago.  He was given extra Lasix for 5 days and that resolved and he has not had any further issues with that.  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  Ejection fraction was unchanged to 25%.  He is limited in his physical activity.  He continues to have chronic dyspnea.  He has not had any chest pain, pressure, or tightness either at rest or with exertion.  He has noted some lightheadedness when he bends over to feed his dogs.  His appetite is good.  He has not had any significant weight loss.  Past Medical History:  Diagnosis Date  . Arthritis    lower back  . Cardiomyopathy ([place])      EF originally 20%-now 45%, no CAD on cath  . Chronic combined systolic and diastolic CHF (congestive heart failure) ([place])    a. 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 Reported h/o cath in 2008 without significant CAD.  Marland Kitchen Hyperlipidemia few yrs ago  . Hypertension   . Hypertriglyceridemia   . PAF (paroxysmal atrial fibrillation) ([place])    a. Postop 2008. b. recurrent afib 08/2014 with LAA clot noted on TEE. EF was down again at 25%. He was anticoagulated with Eliquis and placed on Amiodarone.He ultimately underwent TEE-DCCV 08/2014.  . S/P aortic valve replacement with bioprosthetic valve    a. bioprosthetic AVR in 05/2007 in [place].bovine valve  . Type II diabetes mellitus ([place])     Current Outpatient Medications  Medication Sig Dispense Refill  . amiodarone (PACERONE) 100 MG tablet Take 1 tablet (100 mg total) by mouth daily. 90 tablet 2  . amoxicillin (AMOXIL) 875 MG tablet TAKE 1 TABLET BY MOUTH EVERY 12 HOURS FOR 10 DAYS  0  . apixaban (ELIQUIS) 5 MG TABS tablet Take 1 tablet (5 mg total) by mouth 2 (two) times daily. 180 tablet 2  . atorvastatin (LIPITOR) 10 MG tablet Take 10 mg by mouth every morning.    . carvedilol (COREG) 6.25 MG tablet  Take 1 tablet (6.25 mg total) by mouth 2 (two) times daily with a meal. 180 tablet 1  . empagliflozin (JARDIANCE) 10 MG TABS tablet Take 10 mg by mouth daily.     . furosemide (LASIX) 40 MG tablet Take 1 tablet (40 mg total) by mouth daily. 90 tablet 1  . Insulin Glargine (TOUJEO SOLOSTAR) 300 UNIT/ML SOPN Inject 28 Units into the skin at bedtime.    . insulin regular (NOVOLIN R) 100 units/mL injection Inject 0.03 mLs (3 Units total) into the skin 3 (three) times daily before meals. (Patient taking differently: Inject 3-8 Units into the skin See admin instructions. Inject 3 units SQ before breakfast and lunch, inject 8 units SQ before supper) 20 mL 3  . Multiple Vitamin  (MULTIVITAMIN) tablet Take 1 tablet by mouth daily.     No current facility-administered medications for this visit.    Past Surgical History:  Procedure Laterality Date  . AMPUTATION Right 07/29/2014   Procedure: AMPUTATION RIGHT LONG FINGER;  Surgeon: [name], MD;  Location: [facility];  Service: Orthopedics;  Laterality: Right;  . AMPUTATION Right 01/13/2016   Procedure: RIGHT BELOW KNEE AMPUTATION;  Surgeon: Newt Minion, MD;  Location: [facility];  Service: Orthopedics;  Laterality: Right;  . AORTIC VALVE REPLACEMENT  05/2007   with tissue graft  . APPLICATION OF WOUND VAC Right 01/13/2016   Procedure: APPLICATION OF WOUND VAC;  Surgeon: Newt Minion, MD;  Location: [facility];  Service: Orthopedics;  Laterality: Right;  . CARDIAC CATHETERIZATION  "several"  . CARDIAC VALVE REPLACEMENT    . CARDIOVERSION N/A 07/23/2014   Procedure: CARDIOVERSION;  Surgeon: Josue Hector, MD;  Location: [facility];  Service: Cardiovascular;  Laterality: N/A;  . CARDIOVERSION N/A 09/01/2014   Procedure: CARDIOVERSION;  Surgeon: [name], MD;  Location: [place] [facility];  Service: Cardiovascular;  Laterality: N/A;  . COLONOSCOPY WITH PROPOFOL N/A 05/29/2017   Procedure: COLONOSCOPY WITH PROPOFOL;  Surgeon: Wilford Corner, MD;  Location: [facility];  Service: Endoscopy;  Laterality: N/A;  . I&D EXTREMITY Right 05/05/2014   Procedure: IRRIGATION AND DEBRIDEMENT EXTREMITY;  Surgeon: [name], MD;  Location: [facility];  Service: Orthopedics;  Laterality: Right;  . LEFT HEART CATH AND CORONARY ANGIOGRAPHY N/A 05/17/2017   Procedure: LEFT HEART CATH AND CORONARY ANGIOGRAPHY;  Surgeon: Martinique, Peter M, MD;  Location: [facility];  Service: Cardiovascular;  Laterality: N/A;  . TEE WITHOUT CARDIOVERSION N/A 07/23/2014   Procedure: TRANSESOPHAGEAL ECHOCARDIOGRAM (TEE);  Surgeon: Josue Hector, MD;  Location: [facility];  Service: Cardiovascular;  Laterality: N/A;  . TEE WITHOUT CARDIOVERSION N/A  09/01/2014   Procedure: TRANSESOPHAGEAL ECHOCARDIOGRAM (TEE);  Surgeon: [name], MD;  Location[facility];  Service: Cardiovascular;  Laterality: N/A;  . TEE WITHOUT CARDIOVERSION N/A 04/17/2017   Procedure: TRANSESOPHAGEAL ECHOCARDIOGRAM (TEE);  Surgeon: Jerline Pain, MD;  Location: [facility];  Service: Cardiovascular;  Laterality: N/A;  . TONSILLECTOMY  1954     Physical Exam  Constitutional: He is oriented to person, place, and time. No distress.  Obese  HENT:  Head: Normocephalic and atraumatic.  Mouth/Throat: No oropharyngeal exudate.  Eyes: Conjunctivae and EOM are normal. No scleral icterus.  Neck: Neck supple. No thyromegaly present.  Cardiovascular: Normal rate and regular rhythm.  Murmur (Faint systolic murmur) heard. Pulmonary/Chest: Effort normal and breath sounds normal. No respiratory distress. He has no wheezes. He has no rales.  Abdominal: Soft. He exhibits no distension. There is no tenderness.  Musculoskeletal: He exhibits  edema (1+) and deformity (Right BKA with prosthesis in place).  Lymphadenopathy:    He has no cervical adenopathy.  Neurological: He is alert and oriented to person, place, and time. No cranial nerve deficit. Coordination normal.  Skin: Skin is warm and dry.  Vitals reviewed.   Diagnostic Tests: CT CHEST WITHOUT CONTRAST  TECHNIQUE: Multidetector CT imaging of the chest was performed following the standard protocol without IV contrast.  COMPARISON:  10/03/2017 chest CT.  05/07/2017 PET-CT.  FINDINGS: Cardiovascular: Top-normal heart size. Aortic valve prosthesis is in place. No significant pericardial effusion/thickening. Three-vessel coronary atherosclerosis. Atherosclerotic thoracic aorta with stable ectatic 4.0 cm ascending thoracic aorta. Stable top-normal main pulmonary artery (3.1 cm diameter).  Mediastinum/Nodes: Stable hypodense small right thyroid lobe nodules, largest 1.4 cm. Unremarkable esophagus. No  pathologically enlarged axillary, mediastinal or hilar lymph nodes, noting limited sensitivity for the detection of hilar adenopathy on this noncontrast study. Circumscribed anterior mediastinal 2.0 x 1.6 x 2.4 cm hypodense mass with density 24 HU (series 2/image 40) at the level of the proximal aortic arch, previously 1.7 x 1.3 x 1.9 cm on 02/26/2017 chest CT and 1.7 x 1.4 x 2.0 cm on 10/03/2017 chest CT, increased in size and also slightly increased in density.  Lungs/Pleura: No pneumothorax. No pleural effusion. Subsegmental scarring versus atelectasis in the bilateral lower lobes. No acute consolidative airspace disease, lung masses or significant pulmonary nodules.  Upper abdomen: Cholelithiasis.  Musculoskeletal: No aggressive appearing focal osseous lesions. Intact sternotomy wires. Marked thoracic spondylosis.  IMPRESSION: 1. Interval mild growth of circumscribed anterior mediastinal mass, now 2.0 x 1.6 x 2.4 cm. This mass has demonstrated consistent slow growth on multiple chest CT studies back to 2015 and has also slightly increased in density. 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 Cardiothoracic surgical consultation suggested. 2. Subsegmental scarring versus atelectasis in the lower lobes. Otherwise no active pulmonary disease. 3. Stable ectatic 4.0 cm ascending thoracic aorta. Recommend annual imaging followup by CTA or MRA. This recommendation follows 2010 ACCF/AHA/AATS/ACR/ASA/SCA/SCAI/SIR/STS/SVM Guidelines for the Diagnosis and Management of Patients with Thoracic Aortic Disease. Circulation. 2010; 121: Y865-H846. 4. Three-vessel coronary atherosclerosis. 5. Cholelithiasis.  Aortic Atherosclerosis (ICD10-I70.0).   Electronically Signed   By: Ilona Sorrel M.D.   On: 04/09/2018 16:30 I personally reviewed the CT images and concur with the findings noted above  Impression: Mr. Coll is a year-old man with a complicated past history.  He has had a bioprosthetic aortic  valve replacement in 2008.  He has paroxysmal atrial fibrillation for which she is on anticoagulation.  He has cardiomyopathy with chronic systolic and diastolic left heart failure, currently well compensated.  He has chronic dyspnea without evidence of interstitial lung disease.  He also has hypertension, hyperlipidemia, and type 2 diabetes.  He has had a previous right below-knee amputation and right middle finger amputation for osteomyelitis.  He had a CT about a year ago which showed a 1.3 x 1.7cm nodule in the anterior mediastinum.  That was photopenic on PET.  Now a year later the lesion has grown to 1.5 x 2 cm.  This is a difficult clinical situation particularly given his comorbidities.  Even with the unremarkable PET a year ago, the interval change since January of this year is an indication for surgical resection.  I described the general nature of redo sternotomy for resection of the anterior mediastinal mass to him.  He understands that it would be done in the operating room under general anesthesia.  Given his physical limitations, I would expect  him to be in the hospital for approximately 5 days afterwards.  We discussed the indications, risks, benefits, and alternatives.  He understands the alternative is radiographic follow-up.  However he is already shown interval change with radiographic follow-up.  He understands the risk of surgery include, but are not limited to death, MI, DVT, PE, bleeding, possible need for transfusion, infection, air leak secondary to lung injury, as well as possibility of other procedural complications.  He accepts the risks and wishes to proceed  Cardiac-history of prosthetic aortic valve replacement.  Moderate stenosis with mean gradient of 60 mmHg by echo.  Cardiomyopathy with EF of 25 to 30%.  Normal coronaries by catheterization a year ago.  Recently saw Dr. Marlou Porch.  I do not see any need for additional cardiac work-up at this point.  Plan: Redo sternotomy for  resection of anterior mediastinal mass on Monday, August 19.  He will stop apixaban for 2 days prior to the surgery.  Melrose Nakayama, MD [facility] [phone]

## 2018-04-16 ENCOUNTER — Other Ambulatory Visit: Payer: Self-pay

## 2018-04-16 DIAGNOSIS — J9859 Other diseases of mediastinum, not elsewhere classified: Secondary | ICD-10-CM

## 2018-04-16 NOTE — Telephone Encounter (Signed)
Dr Marlou Porch aware.

## 2018-04-18 DIAGNOSIS — E559 Vitamin D deficiency, unspecified: Secondary | ICD-10-CM | POA: Diagnosis not present

## 2018-04-18 DIAGNOSIS — I5032 Chronic diastolic (congestive) heart failure: Secondary | ICD-10-CM | POA: Diagnosis not present

## 2018-04-18 DIAGNOSIS — E782 Mixed hyperlipidemia: Secondary | ICD-10-CM | POA: Diagnosis not present

## 2018-04-18 DIAGNOSIS — R809 Proteinuria, unspecified: Secondary | ICD-10-CM | POA: Diagnosis not present

## 2018-04-18 DIAGNOSIS — Z794 Long term (current) use of insulin: Secondary | ICD-10-CM | POA: Diagnosis not present

## 2018-04-18 DIAGNOSIS — E1165 Type 2 diabetes mellitus with hyperglycemia: Secondary | ICD-10-CM | POA: Diagnosis not present

## 2018-04-18 DIAGNOSIS — I110 Hypertensive heart disease with heart failure: Secondary | ICD-10-CM | POA: Diagnosis not present

## 2018-04-18 DIAGNOSIS — E1142 Type 2 diabetes mellitus with diabetic polyneuropathy: Secondary | ICD-10-CM | POA: Diagnosis not present

## 2018-04-18 DIAGNOSIS — Z89511 Acquired absence of right leg below knee: Secondary | ICD-10-CM | POA: Diagnosis not present

## 2018-04-29 ENCOUNTER — Other Ambulatory Visit: Payer: Self-pay | Admitting: *Deleted

## 2018-05-01 ENCOUNTER — Inpatient Hospital Stay (HOSPITAL_COMMUNITY): Admission: RE | Admit: 2018-05-01 | Payer: Medicare HMO | Source: Ambulatory Visit

## 2018-05-05 ENCOUNTER — Encounter (HOSPITAL_COMMUNITY): Admission: RE | Payer: Self-pay | Source: Ambulatory Visit

## 2018-05-05 ENCOUNTER — Inpatient Hospital Stay (HOSPITAL_COMMUNITY)
Admission: RE | Admit: 2018-05-05 | Payer: Medicare HMO | Source: Ambulatory Visit | Admitting: Thoracic Surgery (Cardiothoracic Vascular Surgery)

## 2018-05-05 SURGERY — STERNOTOMY
Anesthesia: General

## 2018-05-12 ENCOUNTER — Other Ambulatory Visit: Payer: Self-pay | Admitting: Cardiology

## 2018-05-13 DIAGNOSIS — E1165 Type 2 diabetes mellitus with hyperglycemia: Secondary | ICD-10-CM | POA: Diagnosis not present

## 2018-05-13 DIAGNOSIS — Z794 Long term (current) use of insulin: Secondary | ICD-10-CM | POA: Diagnosis not present

## 2018-05-15 DIAGNOSIS — E782 Mixed hyperlipidemia: Secondary | ICD-10-CM | POA: Diagnosis not present

## 2018-05-15 DIAGNOSIS — E1142 Type 2 diabetes mellitus with diabetic polyneuropathy: Secondary | ICD-10-CM | POA: Diagnosis not present

## 2018-05-15 DIAGNOSIS — R809 Proteinuria, unspecified: Secondary | ICD-10-CM | POA: Diagnosis not present

## 2018-05-15 DIAGNOSIS — I5032 Chronic diastolic (congestive) heart failure: Secondary | ICD-10-CM | POA: Diagnosis not present

## 2018-05-15 DIAGNOSIS — Z794 Long term (current) use of insulin: Secondary | ICD-10-CM | POA: Diagnosis not present

## 2018-05-15 DIAGNOSIS — E1165 Type 2 diabetes mellitus with hyperglycemia: Secondary | ICD-10-CM | POA: Diagnosis not present

## 2018-05-15 DIAGNOSIS — Z89511 Acquired absence of right leg below knee: Secondary | ICD-10-CM | POA: Diagnosis not present

## 2018-05-15 DIAGNOSIS — E559 Vitamin D deficiency, unspecified: Secondary | ICD-10-CM | POA: Diagnosis not present

## 2018-05-15 DIAGNOSIS — I110 Hypertensive heart disease with heart failure: Secondary | ICD-10-CM | POA: Diagnosis not present

## 2018-05-21 ENCOUNTER — Ambulatory Visit: Payer: Medicare HMO | Admitting: Thoracic Surgery (Cardiothoracic Vascular Surgery)

## 2018-05-21 ENCOUNTER — Encounter: Payer: Self-pay | Admitting: *Deleted

## 2018-05-21 ENCOUNTER — Other Ambulatory Visit: Payer: Self-pay | Admitting: *Deleted

## 2018-05-21 DIAGNOSIS — J9859 Other diseases of mediastinum, not elsewhere classified: Secondary | ICD-10-CM

## 2018-05-27 ENCOUNTER — Ambulatory Visit: Payer: Medicare HMO | Admitting: Thoracic Surgery (Cardiothoracic Vascular Surgery)

## 2018-05-27 NOTE — Pre-Procedure Instructions (Addendum)
Thomas Wright  05/27/2018      [id number] - [address], [address] [place] [address] Phone: [phone] Fax: [facility] [place], [address] [place] [address] Phone: [phone] Fax: [phone]    Your procedure is scheduled on Monday, September 16th.  Report to [facility] Admitting at 5:30 A.M.  Call this number if you have problems the morning of surgery:  [phone]   Remember:  Do not eat or drink after midnight.   Take these medicines the morning of surgery with A SIP OF WATER amiodarone (PACERONE) 100 MG tablet carvedilol (COREG) 6.25 MG tablet  7 days prior to surgery STOP taking any Aspirin(unless otherwise instructed by your surgeon), Aleve, Naproxen, Ibuprofen, Motrin, Advil, Goody's, BC's, all herbal medications, fish oil, and all vitamins  Follow your surgeon's instructions on when to stop Eliquis.  If no instructions were given by your surgeon then you will need to call the office to get those instructions.    WHAT DO I DO ABOUT MY DIABETES MEDICATION?   Marland Kitchen Do not take oral diabetes medicines (pills) the morning of surgery-metFORMIN (GLUCOPHAGE-XR)  . THE NIGHT BEFORE SURGERY, take 16 units of Insulin Glargine (TOUJEO SOLOSTAR).      . If your CBG is greater than 220 mg/dL, you may take  of your sliding scale (correction) dose of insulin regular (NOVOLIN R).    How to Manage Your Diabetes Before and After Surgery  Why is it important to control my blood sugar before and after surgery? . Improving blood sugar levels before and after surgery helps healing and can limit problems. . A way of improving blood sugar control is eating a healthy diet by: o  Eating less sugar and carbohydrates o  Increasing activity/exercise o  Talking with your doctor about reaching your blood sugar goals . High blood sugars (greater than 180  mg/dL) can raise your risk of infections and slow your recovery, so you will need to focus on controlling your diabetes during the weeks before surgery. . Make sure that the doctor who takes care of your diabetes knows about your planned surgery including the date and location.  How do I manage my blood sugar before surgery? . Check your blood sugar at least 4 times a day, starting 2 days before surgery, to make sure that the level is not too high or low. o Check your blood sugar the morning of your surgery when you wake up and every 2 hours until you get to the Short Stay unit. . If your blood sugar is less than 70 mg/dL, you will need to treat for low blood sugar: o Do not take insulin. o Treat a low blood sugar (less than 70 mg/dL) with  cup of clear juice (cranberry or apple), 4 glucose tablets, OR glucose gel. o Recheck blood sugar in 15 minutes after treatment (to make sure it is greater than 70 mg/dL). If your blood sugar is not greater than 70 mg/dL on recheck, call [phone] for further instructions. . Report your blood sugar to the short stay nurse when you get to Short Stay.  . If you are admitted to the hospital after surgery: o Your blood sugar will be checked by the staff and you will probably be given insulin after surgery (instead of oral diabetes medicines) to make sure you have good blood sugar levels. o The  goal for blood sugar control after surgery is 80-180 mg/dL.     Do not wear jewelry.   Do not wear lotions, powders, or colognes, or deodorant.  Men may shave face and neck.  Do not bring valuables to the hospital.  [facility] is not responsible for any belongings or valuables.  Contacts, dentures or bridgework may not be worn into surgery.  Leave your suitcase in the car.  After surgery it may be brought to your room.  For patients admitted to the hospital, discharge time will be determined by your treatment team.  Patients discharged the day of surgery will not  be allowed to drive home.   Special instructions:   [facility]- Preparing For Surgery  Before surgery, you can play an important role. Because skin is not sterile, your skin needs to be as free of germs as possible. You can reduce the number of germs on your skin by washing with CHG (chlorahexidine gluconate) Soap before surgery.  CHG is an antiseptic cleaner which kills germs and bonds with the skin to continue killing germs even after washing.    Oral Hygiene is also important to reduce your risk of infection.  Remember - BRUSH YOUR TEETH THE MORNING OF SURGERY WITH YOUR REGULAR TOOTHPASTE  Please do not use if you have an allergy to CHG or antibacterial soaps. If your skin becomes reddened/irritated stop using the CHG.  Do not shave (including legs and underarms) for at least 48 hours prior to first CHG shower. It is OK to shave your face.  Please follow these instructions carefully.   1. Shower the NIGHT BEFORE SURGERY and the MORNING OF SURGERY with CHG.   2. If you chose to wash your hair, wash your hair first as usual with your normal shampoo.  3. After you shampoo, rinse your hair and body thoroughly to remove the shampoo.  4. Use CHG as you would any other liquid soap. You can apply CHG directly to the skin and wash gently with a scrungie or a clean washcloth.   5. Apply the CHG Soap to your body ONLY FROM THE NECK DOWN.  Do not use on open wounds or open sores. Avoid contact with your eyes, ears, mouth and genitals (private parts). Wash Face and genitals (private parts)  with your normal soap.  6. Wash thoroughly, paying special attention to the area where your surgery will be performed.  7. Thoroughly rinse your body with warm water from the neck down.  8. DO NOT shower/wash with your normal soap after using and rinsing off the CHG Soap.  9. Pat yourself dry with a CLEAN TOWEL.  10. Wear CLEAN PAJAMAS to bed the night before surgery, wear comfortable clothes the morning of  surgery  11. Place CLEAN SHEETS on your bed the night of your first shower and DO NOT SLEEP WITH PETS.    Day of Surgery:  Do not apply any deodorants/lotions.  Please wear clean clothes to the hospital/surgery center.   Remember to brush your teeth WITH YOUR REGULAR TOOTHPASTE.    Please read over the following fact sheets that you were given.

## 2018-05-28 ENCOUNTER — Other Ambulatory Visit: Payer: Self-pay

## 2018-05-28 ENCOUNTER — Encounter (HOSPITAL_COMMUNITY)
Admission: RE | Admit: 2018-05-28 | Discharge: 2018-05-28 | Disposition: A | Discharge status: Home / Self Care | Payer: Medicare HMO | Source: Ambulatory Visit | Attending: Thoracic Surgery (Cardiothoracic Vascular Surgery) | Admitting: Thoracic Surgery (Cardiothoracic Vascular Surgery)

## 2018-05-28 ENCOUNTER — Encounter (HOSPITAL_COMMUNITY): Payer: Self-pay

## 2018-05-28 DIAGNOSIS — I509 Heart failure, unspecified: Secondary | ICD-10-CM | POA: Insufficient documentation

## 2018-05-28 DIAGNOSIS — E119 Type 2 diabetes mellitus without complications: Secondary | ICD-10-CM | POA: Diagnosis not present

## 2018-05-28 DIAGNOSIS — I447 Left bundle-branch block, unspecified: Secondary | ICD-10-CM | POA: Diagnosis not present

## 2018-05-28 DIAGNOSIS — Z01810 Encounter for preprocedural cardiovascular examination: Secondary | ICD-10-CM | POA: Diagnosis not present

## 2018-05-28 DIAGNOSIS — I110 Hypertensive heart disease with heart failure: Secondary | ICD-10-CM | POA: Insufficient documentation

## 2018-05-28 DIAGNOSIS — J449 Chronic obstructive pulmonary disease, unspecified: Secondary | ICD-10-CM | POA: Diagnosis not present

## 2018-05-28 DIAGNOSIS — R9431 Abnormal electrocardiogram [ECG] [EKG]: Secondary | ICD-10-CM | POA: Insufficient documentation

## 2018-05-28 DIAGNOSIS — I739 Peripheral vascular disease, unspecified: Secondary | ICD-10-CM | POA: Insufficient documentation

## 2018-05-28 DIAGNOSIS — D649 Anemia, unspecified: Secondary | ICD-10-CM | POA: Insufficient documentation

## 2018-05-28 DIAGNOSIS — I440 Atrioventricular block, first degree: Secondary | ICD-10-CM | POA: Insufficient documentation

## 2018-05-28 DIAGNOSIS — J9859 Other diseases of mediastinum, not elsewhere classified: Secondary | ICD-10-CM | POA: Diagnosis not present

## 2018-05-28 DIAGNOSIS — Z01812 Encounter for preprocedural laboratory examination: Secondary | ICD-10-CM | POA: Diagnosis not present

## 2018-05-28 HISTORY — DX: Aneurysm of unspecified site: I72.9

## 2018-05-28 HISTORY — DX: Chronic obstructive pulmonary disease, unspecified: J44.9

## 2018-05-28 HISTORY — DX: Dyspnea, unspecified: R06.00

## 2018-05-28 LAB — ABO/RH: ABO/RH(D): O POS

## 2018-05-28 LAB — COMPREHENSIVE METABOLIC PANEL
ALBUMIN: 4.1 g/dL (ref 3.5–5.0)
ALK PHOS: 72 U/L (ref 38–126)
ALT: 15 U/L (ref 0–44)
ANION GAP: 10 (ref 5–15)
AST: 22 U/L (ref 15–41)
BILIRUBIN TOTAL: 0.8 mg/dL (ref 0.3–1.2)
BUN: 27 mg/dL — AB (ref 8–23)
CALCIUM: 9.1 mg/dL (ref 8.9–10.3)
CO2: 21 mmol/L — AB (ref 22–32)
Chloride: 102 mmol/L (ref 98–111)
Creatinine, Ser: 1.41 mg/dL — ABNORMAL HIGH (ref 0.61–1.24)
GFR calc Af Amer: 57 mL/min — ABNORMAL LOW (ref >60)
GFR calc non Af Amer: 49 mL/min — ABNORMAL LOW (ref >60)
GLUCOSE: 201 mg/dL — AB (ref 70–99)
Potassium: 4.8 mmol/L (ref 3.5–5.1)
SODIUM: 133 mmol/L — AB (ref 135–145)
TOTAL PROTEIN: 7.6 g/dL (ref 6.5–8.1)

## 2018-05-28 LAB — URINALYSIS, ROUTINE W REFLEX MICROSCOPIC
BILIRUBIN URINE: NEGATIVE
Bacteria, UA: NONE SEEN
Glucose, UA: >=500 mg/dL — AB
KETONES UR: NEGATIVE mg/dL
Leukocytes, UA: NEGATIVE
NITRITE: NEGATIVE
PH: 5 (ref 5.0–8.0)
Protein, ur: NEGATIVE mg/dL
SPECIFIC GRAVITY, URINE: 1.017 (ref 1.005–1.030)

## 2018-05-28 LAB — PROTIME-INR
INR: 1.01
Prothrombin Time: 13.2 seconds (ref 11.4–15.2)

## 2018-05-28 LAB — CBC
HEMATOCRIT: 46.5 % (ref 39.0–52.0)
HEMOGLOBIN: 15.3 g/dL (ref 13.0–17.0)
MCH: 28 pg (ref 26.0–34.0)
MCHC: 32.9 g/dL (ref 30.0–36.0)
MCV: 85.2 fL (ref 78.0–100.0)
Platelets: 245 10*3/uL (ref 150–400)
RBC: 5.46 MIL/uL (ref 4.22–5.81)
RDW: 13.3 % (ref 11.5–15.5)
WBC: 10.9 10*3/uL — AB (ref 4.0–10.5)

## 2018-05-28 LAB — GLUCOSE, CAPILLARY: Glucose-Capillary: 195 mg/dL — ABNORMAL HIGH (ref 70–99)

## 2018-05-28 LAB — BLOOD GAS, ARTERIAL
Acid-Base Excess: 0.9 mmol/L (ref 0.0–2.0)
Bicarbonate: 24.4 mmol/L (ref 20.0–28.0)
DRAWN BY: 449841
FIO2: 21
O2 Saturation: 96 %
PATIENT TEMPERATURE: 98.6
PH ART: 7.455 — AB (ref 7.350–7.450)
pCO2 arterial: 35.2 mmHg (ref 32.0–48.0)
pO2, Arterial: 75.5 mmHg — ABNORMAL LOW (ref 83.0–108.0)

## 2018-05-28 LAB — APTT: APTT: 37 s — AB (ref 24–36)

## 2018-05-28 LAB — SURGICAL PCR SCREEN
MRSA, PCR: NEGATIVE
Staphylococcus aureus: NEGATIVE

## 2018-05-28 NOTE — Progress Notes (Signed)
PCP - Leighton Ruff [facility])  Cardiologist - Dr. Arlyce Dice  DM - type II Fasting BS - 120s  Echo - 2019  Stress Test - 2014  Cath - 2018  Pulmonologist - w/ [facility] (last seen >16yr)

## 2018-05-29 ENCOUNTER — Other Ambulatory Visit (HOSPITAL_COMMUNITY): Payer: Medicare HMO

## 2018-05-29 NOTE — Progress Notes (Signed)
Anesthesia Chart Review:  Case:  [id number] Date/Time:  06/02/18 0715   Procedures:      REDO STERNOTOMY (N/A )     RESECTION OF ANTERIOR MEDIASTINAL MASS (N/A )   Anesthesia type:  General   Pre-op diagnosis:  ANTERIOR MEDIASTINAL MASS   Location:  [facility] OR ROOM 10 / [facility]   Surgeon:  Melrose Nakayama, MD      DISCUSSION: 72 yo male never smoker for above procedure. 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  Per Dr. Leonarda Salon note 04/15/2018 "Cardiac-history of prosthetic aortic valve replacement.  Moderate stenosis with mean gradient of 60 mmHg by echo.  Cardiomyopathy with EF of 25 to 30%.  Normal coronaries by catheterization a year ago.  Recently saw Dr. Marlou Porch.  I do not see any need for additional cardiac work-up at this point."  Per Dr. Leonarda Salon note he is to stop apixiban 2d prior to surgery.  Anticipate he can proceed with surgery as planned barring acute status change.  VS: BP (!) 149/75   Pulse 82   Temp 36.6 C (Oral)   Resp 18   Ht 6' (1.829 m)   Wt 103.6 kg   SpO2 99%   BMI 30.97 kg/m   PROVIDERS: Leighton Ruff, MD is PCP  [name], MD is Cardiologist last seen 04/04/2018  LABS: Labs reviewed: Acceptable for surgery. Review of previous labs shows baseline creatinine ~1.2-1.3 (all labs ordered are listed, but only abnormal results are displayed)  Labs Reviewed  GLUCOSE, CAPILLARY - Abnormal; Notable for the following components:      Result Value   Glucose-Capillary 195 (*)    All other components within normal limits  APTT - Abnormal; Notable for the following components:   aPTT 37 (*)    All other components within normal limits  BLOOD GAS, ARTERIAL - Abnormal; Notable for the following components:   pH, Arterial  7.455 (*)    pO2, Arterial 75.5 (*)    All other components within normal limits  CBC - Abnormal; Notable for the following components:   WBC 10.9 (*)    All other components within normal limits  COMPREHENSIVE METABOLIC PANEL - Abnormal; Notable for the following components:   Sodium 133 (*)    CO2 21 (*)    Glucose, Bld 201 (*)    BUN 27 (*)    Creatinine, Ser 1.41 (*)    GFR calc non Af Amer 49 (*)    GFR calc Af Amer 57 (*)    All other components within normal limits  URINALYSIS, ROUTINE W REFLEX MICROSCOPIC - Abnormal; Notable for the following components:   Glucose, UA >=500 (*)    Hgb urine dipstick SMALL (*)    All other components within normal limits  SURGICAL PCR SCREEN  PROTIME-INR  TYPE AND SCREEN  ABO/RH     IMAGES: Chest CT 04/09/2018: IMPRESSION: 1. Interval mild growth of circumscribed anterior mediastinal mass, now 2.0 x 1.6 x 2.4 cm. This mass has demonstrated consistent slow growth on multiple chest CT studies back to 2015 and has also slightly increased in density. 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 Cardiothoracic surgical consultation suggested. 2. Subsegmental scarring versus atelectasis in the lower lobes. Otherwise no active pulmonary disease. 3. Stable ectatic 4.0 cm ascending thoracic aorta. Recommend annual imaging followup by CTA  or MRA. This recommendation follows 2010 ACCF/AHA/AATS/ACR/ASA/SCA/SCAI/SIR/STS/SVM Guidelines for the Diagnosis and Management of Patients with Thoracic Aortic Disease. Circulation. 2010; 121: Z009-Q330. 4. Three-vessel coronary atherosclerosis. 5. Cholelithiasis.   EKG: 05/28/2018: Sinus rhythm with 1st degree A-V block with occasional Premature ventricular complexes. Left bundle branch block. Minimal interval change.   CV: TTE 02/21/2018: Study Conclusions  - Left ventricle: The cavity size was normal. There was moderate   concentric hypertrophy. Systolic function was severely reduced.   The estimated ejection fraction was  in the range of 25% to 30%.   Diffuse hypokinesis. Doppler parameters are consistent with   abnormal left ventricular relaxation (grade 1 diastolic   dysfunction). LV filling pressure is elevated. - Aortic valve: s/p bioprosthetic AVR wtih moderate calcific   stenosis. There was trivial regurgitation. Mean gradient (S): 16   mm Hg. Peak gradient (S): 25 mm Hg. Valve area (Vmax): 1.14 cm^2.   Valve area (Vmean): 1.17 cm^2. - Aorta: Dilated aortic root. Aortic root dimension: 42 mm (ED). - Mitral valve: Calcified annulus. Mildly thickened leaflets . - Left atrium: The atrium was normal in size. - Tricuspid valve: There was trivial regurgitation. - Pulmonary arteries: PA peak pressure: 18 mm Hg (S) + RAP.  Impressions:  - Compared to a prior study in 04/2017, the LVEF is lower at 25-30%   with elevated LV filling pressure. The mean gradient across the   bioprosthetic AVR is higher at 16 mmHg, consistent with moderate   stenosis.  Cath 0/76/2263:  LV end diastolic pressure is normal.   1. No significant CAD 2. Normal LVEDP 3. AV gradient approximately 10 mm Hg  Plan: Medical management.   Carotid US 03/05/2017: RIGHT CAROTID ARTERY: Moderate heterogeneous partially calcified right carotid bifurcation atherosclerosis. Mild luminal narrowing by grayscale imaging. Despite this, there is no hemodynamically significant right ICA stenosis, velocity elevation, or turbulent flow. Degree of narrowing estimated less than 50%.  RIGHT VERTEBRAL ARTERY:  Antegrade  LEFT CAROTID ARTERY: Similar moderate heterogeneous partially calcified left carotid bifurcation atherosclerosis. Slight luminal narrowing by grayscale imaging. No hemodynamically significant left ICA stenosis, velocity elevation, or turbulent flow. Degree of narrowing also less than 50%. Left ICA is slightly tortuous.  LEFT VERTEBRAL ARTERY:  Antegrade  IMPRESSION: Moderate bilateral carotid atherosclerosis. No  hemodynamically significant ICA stenosis. Degree of narrowing less than 50% bilaterally.   Past Medical History:  Diagnosis Date  . 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 Arthritis    lower back  . Cardiomyopathy ([place])    EF originally 20%-now 45%, no CAD on cath  . Chronic combined systolic and diastolic CHF (congestive heart failure) ([place])    a. 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 Reported h/o cath in 2008 without significant CAD.  Marland Kitchen COPD (chronic obstructive pulmonary disease) ([place])    per patient  . Dyspnea    when CHF is up  . Hyperlipidemia few yrs ago  . Hypertension   . Hypertriglyceridemia   . PAF (paroxysmal atrial fibrillation) ([place])    a. Postop 2008. b. recurrent afib 08/2014 with LAA clot noted on TEE. EF was down again at 25%. He was anticoagulated with Eliquis and placed on Amiodarone.He ultimately underwent TEE-DCCV 08/2014.  . S/P aortic valve replacement with bioprosthetic valve    a. bioprosthetic AVR in 05/2007 in [place].bovine valve  . Type II diabetes mellitus ([place])  Past Surgical History:  Procedure Laterality Date  . AMPUTATION Right 07/29/2014   Procedure: AMPUTATION RIGHT LONG FINGER;  Surgeon: [name], MD;  Location: [place];  Service: Orthopedics;  Laterality: Right;  . AMPUTATION Right 01/13/2016   Procedure: RIGHT BELOW KNEE AMPUTATION;  Surgeon: Newt Minion, MD;  Location: [place];  Service: Orthopedics;  Laterality: Right;  . AORTIC VALVE REPLACEMENT  05/2007   with tissue graft  . APPLICATION OF WOUND VAC Right 01/13/2016   Procedure: APPLICATION OF WOUND VAC;  Surgeon: Newt Minion, MD;  Location: [facility];  Service: Orthopedics;  Laterality: Right;  . CARDIAC CATHETERIZATION  "several"  . CARDIAC VALVE REPLACEMENT     Sept 2008  . CARDIOVERSION N/A 07/23/2014   Procedure: CARDIOVERSION;  Surgeon: Josue Hector, MD;  Location: [place];   Service: Cardiovascular;  Laterality: N/A;  . CARDIOVERSION N/A 09/01/2014   Procedure: CARDIOVERSION;  Surgeon: [name], MD;  Location: [facility] ENDOSCOPY;  Service: Cardiovascular;  Laterality: N/A;  . cataracts    . COLONOSCOPY WITH PROPOFOL N/A 05/29/2017   Procedure: COLONOSCOPY WITH PROPOFOL;  Surgeon: Wilford Corner, MD;  Location: [facility] ENDOSCOPY;  Service: Endoscopy;  Laterality: N/A;  . I&D EXTREMITY Right 05/05/2014   Procedure: IRRIGATION AND DEBRIDEMENT EXTREMITY;  Surgeon: [name], MD;  Location: [place];  Service: Orthopedics;  Laterality: Right;  . LEFT HEART CATH AND CORONARY ANGIOGRAPHY N/A 05/17/2017   Procedure: LEFT HEART CATH AND CORONARY ANGIOGRAPHY;  Surgeon: Martinique, Peter M, MD;  Location: [place] CV LAB;  Service: Cardiovascular;  Laterality: N/A;  . TEE WITHOUT CARDIOVERSION N/A 07/23/2014   Procedure: TRANSESOPHAGEAL ECHOCARDIOGRAM (TEE);  Surgeon: Josue Hector, MD;  Location: [place];  Service: Cardiovascular;  Laterality: N/A;  . TEE WITHOUT CARDIOVERSION N/A 09/01/2014   Procedure: TRANSESOPHAGEAL ECHOCARDIOGRAM (TEE);  Surgeon: [name], MD;  Location: [facility] ENDOSCOPY;  Service: Cardiovascular;  Laterality: N/A;  . TEE WITHOUT CARDIOVERSION N/A 04/17/2017   Procedure: TRANSESOPHAGEAL ECHOCARDIOGRAM (TEE);  Surgeon: Jerline Pain, MD;  Location: [facility] ENDOSCOPY;  Service: Cardiovascular;  Laterality: N/A;  . TONSILLECTOMY  1952    MEDICATIONS: . amiodarone (PACERONE) 100 MG tablet  . apixaban (ELIQUIS) 5 MG TABS tablet  . atorvastatin (LIPITOR) 10 MG tablet  . b complex vitamins tablet  . carvedilol (COREG) 6.25 MG tablet  . empagliflozin (JARDIANCE) 10 MG TABS tablet  . furosemide (LASIX) 40 MG tablet  . Insulin Glargine (TOUJEO SOLOSTAR) 300 UNIT/ML SOPN  . insulin regular (NOVOLIN R) 100 units/mL injection  . JANUVIA 100 MG tablet  . metFORMIN (GLUCOPHAGE-XR) 500 MG 24 hr tablet   No current facility-administered medications for this  encounter.     Wynonia Musty [facility] Short Stay Center/Anesthesiology Phone [phone] 05/29/2018 8:21 AM

## 2018-05-30 MED ORDER — VANCOMYCIN HCL 10 G IV SOLR
1500.0000 mg | INTRAVENOUS | Status: AC
  Administered 2018-06-02: 1500 mg via INTRAVENOUS
  Filled 2018-05-30: qty 1500

## 2018-05-31 ENCOUNTER — Other Ambulatory Visit: Payer: Self-pay | Admitting: Cardiology

## 2018-06-02 ENCOUNTER — Inpatient Hospital Stay (HOSPITAL_COMMUNITY): Payer: Medicare HMO

## 2018-06-02 ENCOUNTER — Encounter (HOSPITAL_COMMUNITY)
Admission: RE | Disposition: A | Discharge status: Home / Self Care | Payer: Self-pay | Source: Ambulatory Visit | Attending: Thoracic Surgery (Cardiothoracic Vascular Surgery)

## 2018-06-02 ENCOUNTER — Inpatient Hospital Stay (HOSPITAL_COMMUNITY): Payer: Medicare HMO | Admitting: Physician Assistant

## 2018-06-02 ENCOUNTER — Other Ambulatory Visit: Payer: Self-pay

## 2018-06-02 ENCOUNTER — Inpatient Hospital Stay (HOSPITAL_COMMUNITY): Payer: Medicare HMO | Admitting: Certified Registered"

## 2018-06-02 ENCOUNTER — Inpatient Hospital Stay (HOSPITAL_COMMUNITY)
Admission: RE | Admit: 2018-06-02 | Discharge: 2018-06-06 | DRG: 988 | Disposition: A | Discharge status: Home / Self Care | Payer: Medicare HMO | Source: Ambulatory Visit | Attending: Thoracic Surgery (Cardiothoracic Vascular Surgery) | Admitting: Thoracic Surgery (Cardiothoracic Vascular Surgery)

## 2018-06-02 ENCOUNTER — Encounter (HOSPITAL_COMMUNITY): Payer: Self-pay

## 2018-06-02 DIAGNOSIS — Z881 Allergy status to other antibiotic agents status: Secondary | ICD-10-CM | POA: Diagnosis not present

## 2018-06-02 DIAGNOSIS — E781 Pure hyperglyceridemia: Secondary | ICD-10-CM | POA: Diagnosis present

## 2018-06-02 DIAGNOSIS — M1990 Unspecified osteoarthritis, unspecified site: Secondary | ICD-10-CM | POA: Diagnosis present

## 2018-06-02 DIAGNOSIS — I110 Hypertensive heart disease with heart failure: Secondary | ICD-10-CM | POA: Diagnosis not present

## 2018-06-02 DIAGNOSIS — J449 Chronic obstructive pulmonary disease, unspecified: Secondary | ICD-10-CM | POA: Diagnosis present

## 2018-06-02 DIAGNOSIS — Z7901 Long term (current) use of anticoagulants: Secondary | ICD-10-CM

## 2018-06-02 DIAGNOSIS — I5042 Chronic combined systolic (congestive) and diastolic (congestive) heart failure: Secondary | ICD-10-CM | POA: Diagnosis present

## 2018-06-02 DIAGNOSIS — K8020 Calculus of gallbladder without cholecystitis without obstruction: Secondary | ICD-10-CM | POA: Diagnosis present

## 2018-06-02 DIAGNOSIS — R222 Localized swelling, mass and lump, trunk: Secondary | ICD-10-CM | POA: Diagnosis not present

## 2018-06-02 DIAGNOSIS — Z953 Presence of xenogenic heart valve: Secondary | ICD-10-CM

## 2018-06-02 DIAGNOSIS — E328 Other diseases of thymus: Secondary | ICD-10-CM | POA: Diagnosis not present

## 2018-06-02 DIAGNOSIS — R918 Other nonspecific abnormal finding of lung field: Secondary | ICD-10-CM | POA: Diagnosis not present

## 2018-06-02 DIAGNOSIS — I480 Paroxysmal atrial fibrillation: Secondary | ICD-10-CM | POA: Diagnosis present

## 2018-06-02 DIAGNOSIS — I454 Nonspecific intraventricular block: Secondary | ICD-10-CM | POA: Diagnosis present

## 2018-06-02 DIAGNOSIS — J9859 Other diseases of mediastinum, not elsewhere classified: Secondary | ICD-10-CM | POA: Diagnosis not present

## 2018-06-02 DIAGNOSIS — I429 Cardiomyopathy, unspecified: Secondary | ICD-10-CM | POA: Diagnosis present

## 2018-06-02 DIAGNOSIS — Z89511 Acquired absence of right leg below knee: Secondary | ICD-10-CM

## 2018-06-02 DIAGNOSIS — E785 Hyperlipidemia, unspecified: Secondary | ICD-10-CM | POA: Diagnosis not present

## 2018-06-02 DIAGNOSIS — I2510 Atherosclerotic heart disease of native coronary artery without angina pectoris: Secondary | ICD-10-CM | POA: Diagnosis present

## 2018-06-02 DIAGNOSIS — J9811 Atelectasis: Secondary | ICD-10-CM

## 2018-06-02 DIAGNOSIS — E1151 Type 2 diabetes mellitus with diabetic peripheral angiopathy without gangrene: Secondary | ICD-10-CM | POA: Diagnosis present

## 2018-06-02 DIAGNOSIS — Z888 Allergy status to other drugs, medicaments and biological substances status: Secondary | ICD-10-CM | POA: Diagnosis not present

## 2018-06-02 DIAGNOSIS — Z794 Long term (current) use of insulin: Secondary | ICD-10-CM

## 2018-06-02 HISTORY — PX: STERNOTOMY: SHX1057

## 2018-06-02 HISTORY — PX: RESECTION OF MEDIASTINAL MASS: SHX6497

## 2018-06-02 LAB — GLUCOSE, CAPILLARY
Glucose-Capillary: 116 mg/dL — ABNORMAL HIGH (ref 70–99)
Glucose-Capillary: 104 mg/dL — ABNORMAL HIGH (ref 70–99)
Glucose-Capillary: 132 mg/dL — ABNORMAL HIGH (ref 70–99)

## 2018-06-02 LAB — PREPARE RBC (CROSSMATCH)

## 2018-06-02 SURGERY — STERNOTOMY
Anesthesia: General

## 2018-06-02 MED ORDER — HYDROMORPHONE HCL 1 MG/ML IJ SOLN
0.2500 mg | INTRAMUSCULAR | Status: DC | PRN
  Administered 2018-06-02 (×3): 0.5 mg via INTRAVENOUS

## 2018-06-02 MED ORDER — FENTANYL CITRATE (PF) 250 MCG/5ML IJ SOLN
INTRAMUSCULAR | Status: AC
  Filled 2018-06-02: qty 5

## 2018-06-02 MED ORDER — B COMPLEX PO TABS: 1.0000 | ORAL_TABLET | Freq: Every day | ORAL | Status: DC

## 2018-06-02 MED ORDER — HYDROMORPHONE HCL 1 MG/ML IJ SOLN
INTRAMUSCULAR | Status: AC
  Administered 2018-06-02: 0.5 mg via INTRAVENOUS
  Filled 2018-06-02: qty 1

## 2018-06-02 MED ORDER — FUROSEMIDE 40 MG PO TABS
40.0000 mg | ORAL_TABLET | Freq: Every day | ORAL | Status: DC
  Administered 2018-06-03 – 2018-06-06 (×4): 40 mg via ORAL
  Filled 2018-06-02 (×4): qty 1

## 2018-06-02 MED ORDER — INSULIN ASPART 100 UNIT/ML ~~LOC~~ SOLN
0.0000 [IU] | Freq: Four times a day (QID) | SUBCUTANEOUS | Status: DC
  Administered 2018-06-02: 2 [IU] via SUBCUTANEOUS
  Administered 2018-06-03: 8 [IU] via SUBCUTANEOUS
  Administered 2018-06-03: 4 [IU] via SUBCUTANEOUS

## 2018-06-02 MED ORDER — SUGAMMADEX SODIUM 500 MG/5ML IV SOLN
INTRAVENOUS | Status: DC | PRN
  Administered 2018-06-02: 500 mg via INTRAVENOUS

## 2018-06-02 MED ORDER — DEXAMETHASONE SODIUM PHOSPHATE 10 MG/ML IJ SOLN
INTRAMUSCULAR | Status: DC | PRN
  Administered 2018-06-02: 10 mg via INTRAVENOUS

## 2018-06-02 MED ORDER — 0.9 % SODIUM CHLORIDE (POUR BTL) OPTIME
TOPICAL | Status: DC | PRN
  Administered 2018-06-02: 4000 mL

## 2018-06-02 MED ORDER — FENTANYL CITRATE (PF) 100 MCG/2ML IJ SOLN
INTRAMUSCULAR | Status: DC | PRN
  Administered 2018-06-02: 50 ug via INTRAVENOUS
  Administered 2018-06-02: 250 ug via INTRAVENOUS
  Administered 2018-06-02 (×2): 50 ug via INTRAVENOUS
  Administered 2018-06-02: 150 ug via INTRAVENOUS
  Administered 2018-06-02: 100 ug via INTRAVENOUS

## 2018-06-02 MED ORDER — OXYCODONE HCL 5 MG PO TABS
5.0000 mg | ORAL_TABLET | ORAL | Status: DC | PRN
  Administered 2018-06-02: 5 mg via ORAL
  Filled 2018-06-02: qty 1

## 2018-06-02 MED ORDER — ONDANSETRON HCL 4 MG/2ML IJ SOLN
INTRAMUSCULAR | Status: DC | PRN
  Administered 2018-06-02: 4 mg via INTRAVENOUS

## 2018-06-02 MED ORDER — ACETAMINOPHEN 160 MG/5ML PO SOLN: 1000.0000 mg | Freq: Four times a day (QID) | ORAL | Status: DC

## 2018-06-02 MED ORDER — LACTATED RINGERS IV SOLN
INTRAVENOUS | Status: DC | PRN
  Administered 2018-06-02: 07:00:00 via INTRAVENOUS

## 2018-06-02 MED ORDER — TRAMADOL HCL 50 MG PO TABS
50.0000 mg | ORAL_TABLET | Freq: Four times a day (QID) | ORAL | Status: DC | PRN
  Administered 2018-06-03 – 2018-06-05 (×2): 50 mg via ORAL
  Filled 2018-06-02 (×2): qty 1

## 2018-06-02 MED ORDER — ONDANSETRON HCL 4 MG/2ML IJ SOLN
INTRAMUSCULAR | Status: AC
  Filled 2018-06-02: qty 2

## 2018-06-02 MED ORDER — HEMOSTATIC AGENTS (NO CHARGE) OPTIME
TOPICAL | Status: DC | PRN
  Administered 2018-06-02: 1 via TOPICAL

## 2018-06-02 MED ORDER — BISACODYL 5 MG PO TBEC
10.0000 mg | DELAYED_RELEASE_TABLET | Freq: Every day | ORAL | Status: DC
  Administered 2018-06-03: 10 mg via ORAL
  Filled 2018-06-02 (×2): qty 2

## 2018-06-02 MED ORDER — SUGAMMADEX SODIUM 500 MG/5ML IV SOLN
INTRAVENOUS | Status: AC
  Filled 2018-06-02: qty 5

## 2018-06-02 MED ORDER — SODIUM CHLORIDE 0.9 % IV SOLN
INTRAVENOUS | Status: DC | PRN
  Administered 2018-06-02: 30 ug/min via INTRAVENOUS

## 2018-06-02 MED ORDER — INFLUENZA VAC SPLIT HIGH-DOSE 0.5 ML IM SUSY
0.5000 mL | PREFILLED_SYRINGE | INTRAMUSCULAR | Status: DC
  Filled 2018-06-02: qty 0.5

## 2018-06-02 MED ORDER — OXYCODONE HCL 5 MG PO TABS
5.0000 mg | ORAL_TABLET | ORAL | Status: DC | PRN
  Administered 2018-06-03: 5 mg via ORAL
  Filled 2018-06-02: qty 1

## 2018-06-02 MED ORDER — VANCOMYCIN HCL IN DEXTROSE 1-5 GM/200ML-% IV SOLN
1000.0000 mg | Freq: Two times a day (BID) | INTRAVENOUS | Status: AC
  Administered 2018-06-02: 1000 mg via INTRAVENOUS
  Filled 2018-06-02: qty 200

## 2018-06-02 MED ORDER — ACETAMINOPHEN 500 MG PO TABS
1000.0000 mg | ORAL_TABLET | Freq: Four times a day (QID) | ORAL | Status: DC
  Administered 2018-06-02 – 2018-06-06 (×14): 1000 mg via ORAL
  Filled 2018-06-02 (×15): qty 2

## 2018-06-02 MED ORDER — MIDAZOLAM HCL 2 MG/2ML IJ SOLN
INTRAMUSCULAR | Status: AC
  Filled 2018-06-02: qty 2

## 2018-06-02 MED ORDER — SENNOSIDES-DOCUSATE SODIUM 8.6-50 MG PO TABS
1.0000 | ORAL_TABLET | Freq: Every day | ORAL | Status: DC
  Administered 2018-06-03 – 2018-06-05 (×2): 1 via ORAL
  Filled 2018-06-02 (×3): qty 1

## 2018-06-02 MED ORDER — HYDROMORPHONE HCL 1 MG/ML IJ SOLN
INTRAMUSCULAR | Status: AC
  Filled 2018-06-02: qty 1

## 2018-06-02 MED ORDER — ETOMIDATE 2 MG/ML IV SOLN
INTRAVENOUS | Status: AC
  Filled 2018-06-02: qty 10

## 2018-06-02 MED ORDER — AMIODARONE HCL 200 MG PO TABS
100.0000 mg | ORAL_TABLET | Freq: Every day | ORAL | Status: DC
  Administered 2018-06-03 – 2018-06-06 (×4): 100 mg via ORAL
  Filled 2018-06-02 (×4): qty 1

## 2018-06-02 MED ORDER — ENOXAPARIN SODIUM 40 MG/0.4ML ~~LOC~~ SOLN
40.0000 mg | SUBCUTANEOUS | Status: AC
  Administered 2018-06-02 – 2018-06-03 (×2): 40 mg via SUBCUTANEOUS
  Filled 2018-06-02 (×3): qty 0.4

## 2018-06-02 MED ORDER — HYDRALAZINE HCL 20 MG/ML IJ SOLN
10.0000 mg | INTRAMUSCULAR | Status: DC | PRN
  Administered 2018-06-02: 10 mg via INTRAVENOUS
  Filled 2018-06-02: qty 1

## 2018-06-02 MED ORDER — DEXAMETHASONE SODIUM PHOSPHATE 10 MG/ML IJ SOLN
INTRAMUSCULAR | Status: AC
  Filled 2018-06-02: qty 1

## 2018-06-02 MED ORDER — ROCURONIUM BROMIDE 50 MG/5ML IV SOSY
PREFILLED_SYRINGE | INTRAVENOUS | Status: DC | PRN
  Administered 2018-06-02: 100 mg via INTRAVENOUS

## 2018-06-02 MED ORDER — ETOMIDATE 2 MG/ML IV SOLN
INTRAVENOUS | Status: DC | PRN
  Administered 2018-06-02: 16 mg via INTRAVENOUS

## 2018-06-02 MED ORDER — LIDOCAINE 2% (20 MG/ML) 5 ML SYRINGE
INTRAMUSCULAR | Status: AC
  Filled 2018-06-02: qty 5

## 2018-06-02 MED ORDER — SUCCINYLCHOLINE CHLORIDE 20 MG/ML IJ SOLN
INTRAMUSCULAR | Status: DC | PRN
  Administered 2018-06-02: 140 mg via INTRAVENOUS

## 2018-06-02 MED ORDER — MORPHINE SULFATE (PF) 2 MG/ML IV SOLN
2.0000 mg | INTRAVENOUS | Status: DC | PRN
  Administered 2018-06-02 – 2018-06-03 (×3): 2 mg via INTRAVENOUS
  Filled 2018-06-02 (×3): qty 1

## 2018-06-02 MED ORDER — ATORVASTATIN CALCIUM 10 MG PO TABS
10.0000 mg | ORAL_TABLET | Freq: Every morning | ORAL | Status: DC
  Administered 2018-06-03 – 2018-06-06 (×4): 10 mg via ORAL
  Filled 2018-06-02 (×4): qty 1

## 2018-06-02 MED ORDER — ONDANSETRON HCL 4 MG/2ML IJ SOLN: 4.0000 mg | Freq: Four times a day (QID) | INTRAMUSCULAR | Status: DC | PRN

## 2018-06-02 MED ORDER — APIXABAN 5 MG PO TABS
5.0000 mg | ORAL_TABLET | Freq: Two times a day (BID) | ORAL | Status: DC
  Administered 2018-06-04 – 2018-06-06 (×5): 5 mg via ORAL
  Filled 2018-06-02 (×5): qty 1

## 2018-06-02 MED ORDER — CARVEDILOL 3.125 MG PO TABS
3.1250 mg | ORAL_TABLET | Freq: Two times a day (BID) | ORAL | Status: DC
  Administered 2018-06-02 – 2018-06-03 (×2): 3.125 mg via ORAL
  Filled 2018-06-02 (×2): qty 1

## 2018-06-02 MED ORDER — ROCURONIUM BROMIDE 50 MG/5ML IV SOSY
PREFILLED_SYRINGE | INTRAVENOUS | Status: AC
  Filled 2018-06-02: qty 5

## 2018-06-02 MED ORDER — ORAL CARE MOUTH RINSE
15.0000 mL | Freq: Two times a day (BID) | OROMUCOSAL | Status: DC
  Administered 2018-06-02 – 2018-06-05 (×4): 15 mL via OROMUCOSAL

## 2018-06-02 MED ORDER — MIDAZOLAM HCL 5 MG/5ML IJ SOLN
INTRAMUSCULAR | Status: DC | PRN
  Administered 2018-06-02: 1 mg via INTRAVENOUS

## 2018-06-02 MED ORDER — SUCCINYLCHOLINE CHLORIDE 200 MG/10ML IV SOSY
PREFILLED_SYRINGE | INTRAVENOUS | Status: AC
  Filled 2018-06-02: qty 10

## 2018-06-02 MED ORDER — SODIUM CHLORIDE 0.9 % IV SOLN
INTRAVENOUS | Status: DC
  Administered 2018-06-02: 12:00:00 via INTRAVENOUS

## 2018-06-02 MED ORDER — PROPOFOL 10 MG/ML IV BOLUS
INTRAVENOUS | Status: AC
  Filled 2018-06-02: qty 20

## 2018-06-02 SURGICAL SUPPLY — 79 items
BAG DECANTER FOR FLEXI CONT (MISCELLANEOUS) IMPLANT
BANDAGE ACE 4X5 VEL STRL LF (GAUZE/BANDAGES/DRESSINGS) IMPLANT
BANDAGE ACE 6X5 VEL STRL LF (GAUZE/BANDAGES/DRESSINGS) IMPLANT
BASKET HEART  (ORDER IN 25'S) (MISCELLANEOUS)
BASKET HEART (ORDER IN 25'S) (MISCELLANEOUS)
BASKET HEART (ORDER IN 25S) (MISCELLANEOUS) IMPLANT
BLADE STERNUM SYSTEM 6 (BLADE) IMPLANT
BNDG GAUZE ELAST 4 BULKY (GAUZE/BANDAGES/DRESSINGS) IMPLANT
CANISTER SUCT 3000ML PPV (MISCELLANEOUS) ×3 IMPLANT
CANNULA EZ GLIDE AORTIC 21FR (CANNULA) IMPLANT
CATH CPB KIT HENDRICKSON (MISCELLANEOUS) IMPLANT
CATH ROBINSON RED A/P 18FR (CATHETERS) IMPLANT
CATH THORACIC 36FR (CATHETERS) IMPLANT
CATH THORACIC 36FR RT ANG (CATHETERS) IMPLANT
CLIP VESOCCLUDE MED 24/CT (CLIP) ×3 IMPLANT
CLIP VESOCCLUDE SM WIDE 24/CT (CLIP) ×3 IMPLANT
CRADLE DONUT ADULT HEAD (MISCELLANEOUS) ×3 IMPLANT
DRAPE CARDIOVASCULAR INCISE (DRAPES) ×2
DRAPE SLUSH/WARMER DISC (DRAPES) ×3 IMPLANT
DRAPE SRG 135X102X78XABS (DRAPES) ×1 IMPLANT
DRSG AQUACEL AG ADV 3.5X14 (GAUZE/BANDAGES/DRESSINGS) ×3 IMPLANT
DRSG COVADERM 4X10 (GAUZE/BANDAGES/DRESSINGS) ×3 IMPLANT
DRSG COVADERM 4X14 (GAUZE/BANDAGES/DRESSINGS) ×3 IMPLANT
ELECT REM PT RETURN 9FT ADLT (ELECTROSURGICAL) ×6
ELECTRODE REM PT RTRN 9FT ADLT (ELECTROSURGICAL) ×2 IMPLANT
FELT TEFLON 1X6 (MISCELLANEOUS) IMPLANT
GAUZE SPONGE 4X4 12PLY STRL (GAUZE/BANDAGES/DRESSINGS) ×3 IMPLANT
GLOVE SURG SIGNA 7.5 PF LTX (GLOVE) ×9 IMPLANT
GOWN STRL REUS W/ TWL LRG LVL3 (GOWN DISPOSABLE) ×4 IMPLANT
GOWN STRL REUS W/ TWL XL LVL3 (GOWN DISPOSABLE) ×2 IMPLANT
GOWN STRL REUS W/TWL LRG LVL3 (GOWN DISPOSABLE) ×8
GOWN STRL REUS W/TWL XL LVL3 (GOWN DISPOSABLE) ×4
HEMOSTAT POWDER SURGIFOAM 1G (HEMOSTASIS) IMPLANT
HEMOSTAT SURGICEL 2X14 (HEMOSTASIS) IMPLANT
INSERT FOGARTY XLG (MISCELLANEOUS) IMPLANT
KIT BASIN OR (CUSTOM PROCEDURE TRAY) ×3 IMPLANT
KIT SUCTION CATH 14FR (SUCTIONS) ×3 IMPLANT
KIT TURNOVER KIT B (KITS) ×3 IMPLANT
KIT VASOVIEW HEMOPRO VH 3000 (KITS) IMPLANT
MARKER GRAFT CORONARY BYPASS (MISCELLANEOUS) IMPLANT
NS IRRIG 1000ML POUR BTL (IV SOLUTION) ×12 IMPLANT
PACK E OPEN HEART (SUTURE) IMPLANT
PACK OPEN HEART (CUSTOM PROCEDURE TRAY) IMPLANT
PAD ARMBOARD 7.5X6 YLW CONV (MISCELLANEOUS) ×6 IMPLANT
PAD ELECT DEFIB RADIOL ZOLL (MISCELLANEOUS) ×3 IMPLANT
PENCIL BUTTON HOLSTER BLD 10FT (ELECTRODE) IMPLANT
PUNCH AORTIC ROTATE 4.0MM (MISCELLANEOUS) IMPLANT
PUNCH AORTIC ROTATE 4.5MM 8IN (MISCELLANEOUS) IMPLANT
PUNCH AORTIC ROTATE 5MM 8IN (MISCELLANEOUS) IMPLANT
SUT BONE WAX W31G (SUTURE) ×3 IMPLANT
SUT MNCRL AB 4-0 PS2 18 (SUTURE) IMPLANT
SUT PROLENE 3 0 SH DA (SUTURE) IMPLANT
SUT PROLENE 4 0 RB 1 (SUTURE)
SUT PROLENE 4 0 SH DA (SUTURE) IMPLANT
SUT PROLENE 4-0 RB1 .5 CRCL 36 (SUTURE) IMPLANT
SUT PROLENE 6 0 C 1 30 (SUTURE) IMPLANT
SUT PROLENE 7 0 BV1 MDA (SUTURE) IMPLANT
SUT PROLENE 8 0 BV175 6 (SUTURE) IMPLANT
SUT STEEL 6MS V (SUTURE) IMPLANT
SUT STEEL STERNAL CCS#1 18IN (SUTURE) ×3 IMPLANT
SUT STEEL SZ 6 DBL 3X14 BALL (SUTURE) ×3 IMPLANT
SUT VIC AB 1 CTX 36 (SUTURE) ×4
SUT VIC AB 1 CTX36XBRD ANBCTR (SUTURE) ×2 IMPLANT
SUT VIC AB 2-0 CT1 27 (SUTURE)
SUT VIC AB 2-0 CT1 TAPERPNT 27 (SUTURE) IMPLANT
SUT VIC AB 2-0 CTX 27 (SUTURE) ×6 IMPLANT
SUT VIC AB 3-0 SH 27 (SUTURE)
SUT VIC AB 3-0 SH 27X BRD (SUTURE) IMPLANT
SUT VIC AB 3-0 X1 27 (SUTURE) ×6 IMPLANT
SUT VICRYL 4-0 PS2 18IN ABS (SUTURE) IMPLANT
SYSTEM SAHARA CHEST DRAIN ATS (WOUND CARE) ×3 IMPLANT
TAPE CLOTH SURG 4X10 WHT LF (GAUZE/BANDAGES/DRESSINGS) ×3 IMPLANT
TOWEL GREEN STERILE (TOWEL DISPOSABLE) ×3 IMPLANT
TOWEL GREEN STERILE FF (TOWEL DISPOSABLE) ×6 IMPLANT
TRAY FOLEY SLVR 16FR TEMP STAT (SET/KITS/TRAYS/PACK) ×3 IMPLANT
TUBE FEEDING 8FR 16IN STR KANG (MISCELLANEOUS) IMPLANT
TUBING INSUFFLATION (TUBING) IMPLANT
UNDERPAD 30X30 (UNDERPADS AND DIAPERS) ×3 IMPLANT
WATER STERILE IRR 1000ML POUR (IV SOLUTION) ×6 IMPLANT

## 2018-06-02 NOTE — Anesthesia Preprocedure Evaluation (Signed)
Anesthesia Evaluation  Patient identified by MRN, date of birth, ID band Patient awake    Reviewed: Allergy & Precautions, NPO status , Patient's Chart, lab work & pertinent test results  Airway Mallampati: II  TM Distance: >3 FB     Dental   Pulmonary shortness of breath, COPD,    breath sounds clear to auscultation       Cardiovascular hypertension, + Peripheral Vascular Disease and +CHF   Rhythm:Regular Rate:Normal     Neuro/Psych  Neuromuscular disease    GI/Hepatic negative GI ROS, Neg liver ROS,   Endo/Other  diabetes  Renal/GU Renal disease     Musculoskeletal  (+) Arthritis ,   Abdominal   Peds  Hematology  (+) anemia ,   Anesthesia Other Findings   Reproductive/Obstetrics                             Anesthesia Physical Anesthesia Plan  ASA: III  Anesthesia Plan: General   Post-op Pain Management:    Induction: Intravenous  PONV Risk Score and Plan: Ondansetron, Propofol infusion and Treatment may vary due to age or medical condition  Airway Management Planned: Oral ETT  Additional Equipment:   Intra-op Plan:   Post-operative Plan: Possible Post-op intubation/ventilation  Informed Consent: I have reviewed the patients History and Physical, chart, labs and discussed the procedure including the risks, benefits and alternatives for the proposed anesthesia with the patient or authorized representative who has indicated his/her understanding and acceptance.   Dental advisory given  Plan Discussed with: CRNA and Anesthesiologist  Anesthesia Plan Comments:         Anesthesia Quick Evaluation

## 2018-06-02 NOTE — H&P (Signed)
HPI: Thomas Wright returns for follow-up of an anterior mediastinal cystic mass.  Thomas Wright is a 72 year old man with a history of bioprosthetic aortic valve replacement in 2008, paroxysmal atrial fibrillation, cardiomyopathy, chronic combined systolic and diastolic congestive heart failure with ejection fraction of 25%, hypertension, hyperlipidemia, type 2 diabetes, arthritis, right below-knee amputation, and chronic dyspnea.  He was being evaluated for chronic dyspnea with a high-resolution chest CT.  There is no evidence of interstitial lung disease.  He did have a 1.7 cm anterior mediastinal "mass."  That had been present previously but had grown slightly.  I saw him in August 2018.  We scheduled a PET CT.  That showed the nodule was photopenic.  He then was admitted to the hospital and lost to follow-up with me.  He did undergo cardiac catheterization during that hospitalization which showed normal coronaries.  He had another CT in January of this year which showed no change in the anterior mediastinal cyst.  He recently had a follow-up CT which showed interval growth from 1.3 x 1.7 cm to 1.5 x 2 cm.  He was having trouble with fluid retention a few weeks ago.  He was given extra Lasix for 5 days and that resolved and he has not had any further issues with that.  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  Ejection fraction was unchanged to 25%.  He is limited in his physical activity.  He continues to have chronic dyspnea.  He has not had any chest pain, pressure, or tightness either at rest or with exertion.  He has noted some lightheadedness when he bends over to feed his dogs.  His appetite is good.  He has not had any significant weight loss.      Past Medical History:  Diagnosis Date  . Arthritis    lower back  . Cardiomyopathy ([place])    EF originally 20%-now 45%, no CAD on cath  . Chronic combined systolic and  diastolic CHF (congestive heart failure) ([place])    a. 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 Reported h/o cath in 2008 without significant CAD.  Marland Kitchen Hyperlipidemia few yrs ago  . Hypertension   . Hypertriglyceridemia   . PAF (paroxysmal atrial fibrillation) ([place])    a. Postop 2008. b. recurrent afib 08/2014 with LAA clot noted on TEE. EF was down again at 25%. He was anticoagulated with Eliquis and placed on Amiodarone.He ultimately underwent TEE-DCCV 08/2014.  . S/P aortic valve replacement with bioprosthetic valve    a. bioprosthetic AVR in 05/2007 in [place].bovine valve  . Type II diabetes mellitus ([place])           Current Outpatient Medications  Medication Sig Dispense Refill  . amiodarone (PACERONE) 100 MG tablet Take 1 tablet (100 mg total) by mouth daily. 90 tablet 2  . amoxicillin (AMOXIL) 875 MG tablet TAKE 1 TABLET BY MOUTH EVERY 12 HOURS FOR 10 DAYS  0  . apixaban (ELIQUIS) 5 MG TABS tablet Take 1 tablet (5 mg total) by mouth 2 (two) times daily. 180 tablet 2  . atorvastatin (LIPITOR) 10 MG tablet Take 10 mg by mouth every morning.    . carvedilol (COREG) 6.25 MG tablet Take 1 tablet (6.25 mg total) by mouth 2 (two) times daily with a meal. 180 tablet 1  . empagliflozin (JARDIANCE) 10 MG  TABS tablet Take 10 mg by mouth daily.     . furosemide (LASIX) 40 MG tablet Take 1 tablet (40 mg total) by mouth daily. 90 tablet 1  . Insulin Glargine (TOUJEO SOLOSTAR) 300 UNIT/ML SOPN Inject 28 Units into the skin at bedtime.    . insulin regular (NOVOLIN R) 100 units/mL injection Inject 0.03 mLs (3 Units total) into the skin 3 (three) times daily before meals. (Patient taking differently: Inject 3-8 Units into the skin See admin instructions. Inject 3 units SQ before breakfast and lunch, inject 8 units SQ before supper) 20 mL 3  . Multiple Vitamin (MULTIVITAMIN) tablet Take 1 tablet by mouth daily.      No current facility-administered medications for this visit.         Past Surgical History:  Procedure Laterality Date  . AMPUTATION Right 07/29/2014   Procedure: AMPUTATION RIGHT LONG FINGER;  Surgeon: [name], MD;  Location: [place];  Service: Orthopedics;  Laterality: Right;  . AMPUTATION Right 01/13/2016   Procedure: RIGHT BELOW KNEE AMPUTATION;  Surgeon: Newt Minion, MD;  Location: [place];  Service: Orthopedics;  Laterality: Right;  . AORTIC VALVE REPLACEMENT  05/2007   with tissue graft  . APPLICATION OF WOUND VAC Right 01/13/2016   Procedure: APPLICATION OF WOUND VAC;  Surgeon: Newt Minion, MD;  Location: [place];  Service: Orthopedics;  Laterality: Right;  . CARDIAC CATHETERIZATION  "several"  . CARDIAC VALVE REPLACEMENT    . CARDIOVERSION N/A 07/23/2014   Procedure: CARDIOVERSION;  Surgeon: Josue Hector, MD;  Location: [place];  Service: Cardiovascular;  Laterality: N/A;  . CARDIOVERSION N/A 09/01/2014   Procedure: CARDIOVERSION;  Surgeon: [name], MD;  Location: [place];  Service: Cardiovascular;  Laterality: N/A;  . COLONOSCOPY WITH PROPOFOL N/A 05/29/2017   Procedure: COLONOSCOPY WITH PROPOFOL;  Surgeon: Wilford Corner, MD;  [facility]: [facility] ENDOSCOPY;  Service: Endoscopy;  Laterality: N/A;  . I&D EXTREMITY Right 05/05/2014   Procedure: IRRIGATION AND DEBRIDEMENT EXTREMITY;  Surgeon: [name], MD;  Location: [place];  Service: Orthopedics;  Laterality: Right;  . LEFT HEART CATH AND CORONARY ANGIOGRAPHY N/A 05/17/2017   Procedure: LEFT HEART CATH AND CORONARY ANGIOGRAPHY;  Surgeon: Martinique, Peter M, MD;  Location: [place] CV LAB;  Service: Cardiovascular;  Laterality: N/A;  . TEE WITHOUT CARDIOVERSION N/A 07/23/2014   Procedure: TRANSESOPHAGEAL ECHOCARDIOGRAM (TEE);  Surgeon: Josue Hector, MD;  Location: [place];  Service: Cardiovascular;  Laterality: N/A;  . TEE WITHOUT CARDIOVERSION N/A 09/01/2014   Procedure:  TRANSESOPHAGEAL ECHOCARDIOGRAM (TEE);  Surgeon: [name], MD;  Location: [facility] ENDOSCOPY;  Service: Cardiovascular;  Laterality: N/A;  . TEE WITHOUT CARDIOVERSION N/A 04/17/2017   Procedure: TRANSESOPHAGEAL ECHOCARDIOGRAM (TEE);  Surgeon: Jerline Pain, MD;  Location: [facility] ENDOSCOPY;  Service: Cardiovascular;  Laterality: N/A;  . TONSILLECTOMY  1952     Physical Exam  Constitutional: He is oriented to person, place, and time. No distress.  Obese  HENT:  Head: Normocephalic and atraumatic.  Mouth/Throat: No oropharyngeal exudate.  Eyes: Conjunctivae and EOM are normal. No scleral icterus.  Neck: Neck supple. No thyromegaly present.  Cardiovascular: Normal rate and regular rhythm.  Murmur (Faint systolic murmur) heard. Pulmonary/Chest: Effort normal and breath sounds normal. No respiratory distress. He has no wheezes. He has no rales.  Abdominal: Soft. He exhibits no distension. There is no tenderness.  Musculoskeletal: He exhibits edema (1+) and deformity (Right BKA with prosthesis in place).  Lymphadenopathy:    He has no cervical  adenopathy.  Neurological: He is alert and oriented to person, place, and time. No cranial nerve deficit. Coordination normal.  Skin: Skin is warm and dry.  Vitals reviewed.   Diagnostic Tests: CT CHEST WITHOUT CONTRAST  TECHNIQUE: Multidetector CT imaging of the chest was performed following the standard protocol without IV contrast.  COMPARISON: 10/03/2017 chest CT. 05/07/2017 PET-CT.  FINDINGS: Cardiovascular: Top-normal heart size. Aortic valve prosthesis is in place. No significant pericardial effusion/thickening. Three-vessel coronary atherosclerosis. Atherosclerotic thoracic aorta with stable ectatic 4.0 cm ascending thoracic aorta. Stable top-normal main pulmonary artery (3.1 cm diameter).  Mediastinum/Nodes: Stable hypodense small right thyroid lobe nodules, largest 1.4 cm. Unremarkable esophagus. No pathologically enlarged  axillary, mediastinal or hilar lymph nodes, noting limited sensitivity for the detection of hilar adenopathy on this noncontrast study. Circumscribed anterior mediastinal 2.0 x 1.6 x 2.4 cm hypodense mass with density 24 HU (series 2/image 40) at the level of the proximal aortic arch, previously 1.7 x 1.3 x 1.9 cm on 02/26/2017 chest CT and 1.7 x 1.4 x 2.0 cm on 10/03/2017 chest CT, increased in size and also slightly increased in density.  Lungs/Pleura: No pneumothorax. No pleural effusion. Subsegmental scarring versus atelectasis in the bilateral lower lobes. No acute consolidative airspace disease, lung masses or significant pulmonary nodules.  Upper abdomen: Cholelithiasis.  Musculoskeletal: No aggressive appearing focal osseous lesions. Intact sternotomy wires. Marked thoracic spondylosis.  IMPRESSION: 1. Interval mild growth of circumscribed anterior mediastinal mass, now 2.0 x 1.6 x 2.4 cm. This mass has demonstrated consistent slow growth on multiple chest CT studies back to 2015 and has also slightly increased in density. 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 Cardiothoracic surgical consultation suggested. 2. Subsegmental scarring versus atelectasis in the lower lobes. Otherwise no active pulmonary disease. 3. Stable ectatic 4.0 cm ascending thoracic aorta. Recommend annual imaging followup by CTA or MRA. This recommendation follows 2010 ACCF/AHA/AATS/ACR/ASA/SCA/SCAI/SIR/STS/SVM Guidelines for the Diagnosis and Management of Patients with Thoracic Aortic Disease. Circulation. 2010; 121: K160-F093. 4. Three-vessel coronary atherosclerosis. 5. Cholelithiasis.  Aortic Atherosclerosis (ICD10-I70.0).   Electronically Signed By: Ilona Sorrel M.D. On: 04/09/2018 16:30 I personally reviewed the CT images and concur with the findings noted above  Impression: Thomas Wright is a year-old man with a complicated past history.  He has had a bioprosthetic aortic valve replacement in 2008.   He has paroxysmal atrial fibrillation for which she is on anticoagulation.  He has cardiomyopathy with chronic systolic and diastolic left heart failure, currently well compensated.  He has chronic dyspnea without evidence of interstitial lung disease.  He also has hypertension, hyperlipidemia, and type 2 diabetes.  He has had a previous right below-knee amputation and right middle finger amputation for osteomyelitis.  He had a CT about a year ago which showed a 1.3 x 1.7cm nodule in the anterior mediastinum.  That was photopenic on PET.  Now a year later the lesion has grown to 1.5 x 2 cm.  This is a difficult clinical situation particularly given his comorbidities.  Even with the unremarkable PET a year ago, the interval change since January of this year is an indication for surgical resection.  I described the general nature of redo sternotomy for resection of the anterior mediastinal mass to him.  He understands that it would be done in the operating room under general anesthesia.  Given his physical limitations, I would expect him to be in the hospital for approximately 5 days afterwards.  We discussed the indications, risks, benefits, and alternatives.  He understands the alternative  is radiographic follow-up.  However he is already shown interval change with radiographic follow-up.  He understands the risk of surgery include, but are not limited to death, MI, DVT, PE, bleeding, possible need for transfusion, infection, air leak secondary to lung injury, as well as possibility of other procedural complications.  He accepts the risks and wishes to proceed  Cardiac-history of prosthetic aortic valve replacement.  Moderate stenosis with mean gradient of 60 mmHg by echo.  Cardiomyopathy with EF of 25 to 30%.  Normal coronaries by catheterization a year ago.  Recently saw Dr. Marlou Porch.  I do not see any need for additional cardiac work-up at this point.  Plan: Redo sternotomy for resection of anterior  mediastinal mass on Monday, August 19.  He will stop apixaban for 2 days prior to the surgery.  Melrose Nakayama, MD [facility] [phone]           Electronically signed by Melrose Nakayama, MD at 04/15/2018 11:10 AM  No interval change  Revonda Standard. Roxan Hockey, MD [facility] [phone]

## 2018-06-02 NOTE — Interval H&P Note (Signed)
History and Physical Interval Note:  06/02/2018 7:32 AM  Thomas Wright  has presented today for surgery, with the diagnosis of ANTERIOR MEDIASTINAL MASS  The various methods of treatment have been discussed with the patient and family. After consideration of risks, benefits and other options for treatment, the patient has consented to  Procedure(s): REDO STERNOTOMY (N/A) RESECTION OF ANTERIOR MEDIASTINAL MASS (N/A) as a surgical intervention .  The patient's history has been reviewed, patient examined, no change in status, stable for surgery.  I have reviewed the patient's chart and labs.  Questions were answered to the patient's satisfaction.     Melrose Nakayama

## 2018-06-02 NOTE — Anesthesia Procedure Notes (Signed)
Arterial Line Insertion Start/End9/16/2019 7:15 AM Performed by: Lance Coon, CRNA, CRNA  Preanesthetic checklist: patient identified, IV checked, site marked, risks and benefits discussed, surgical consent, monitors and equipment checked, pre-op evaluation, timeout performed and anesthesia consent Lidocaine 1% used for infiltration and patient sedated Right, radial was placed Catheter size: 20 G Hand hygiene performed  and maximum sterile barriers used  Allen's test indicative of satisfactory collateral circulation Attempts: 1 Procedure performed without using ultrasound guided technique. Following insertion, dressing applied and Biopatch. Post procedure assessment: normal  Patient tolerated the procedure well with no immediate complications.

## 2018-06-02 NOTE — Progress Notes (Signed)
      [address]       [place] [address]             [phone]      Some pain, better after morphine  BP (!) 148/72   Pulse 91   Temp 98 F (36.7 C)   Resp 18   Ht 6' (1.829 m)   Wt 103.4 kg   SpO2 100%   BMI 30.92 kg/m   Intake/Output Summary (Last 24 hours) at 06/02/2018 1720 Last data filed at 06/02/2018 1700 Gross per 24 hour  Intake 1223.31 ml  Output 2005 ml  Net -781.69 ml   Doing well early postop  Remo Lipps C. Roxan Hockey, MD [facility] [phone]

## 2018-06-02 NOTE — Transfer of Care (Signed)
Immediate Anesthesia Transfer of Care Note  Patient: Thomas Wright  Procedure(s) Performed: REDO STERNOTOMY (N/A ) RESECTION OF ANTERIOR MEDIASTINAL MASS (N/A )  Patient Location: PACU  Anesthesia Type:General  Level of Consciousness: awake and patient cooperative  Airway & Oxygen Therapy: Patient Spontanous Breathing and Patient connected to face mask oxygen  Post-op Assessment: Report given to RN and Post -op Vital signs reviewed and stable  Post vital signs: Reviewed and stable  Last Vitals:  Vitals Value Taken Time  BP 150/55 06/02/2018  9:48 AM  Temp    Pulse 57 06/02/2018  9:50 AM  Resp 13 06/02/2018  9:50 AM  SpO2 99 % 06/02/2018  9:50 AM  Vitals shown include unvalidated device data.  Last Pain:  Vitals:   06/02/18 0653  TempSrc:   PainSc: 0-No pain         Complications: No apparent anesthesia complications

## 2018-06-02 NOTE — Brief Op Note (Addendum)
06/02/2018  9:24 AM  PATIENT:  [name]  72 y.o. male  PRE-OPERATIVE DIAGNOSIS:  ANTERIOR MEDIASTINAL MASS  POST-OPERATIVE DIAGNOSIS:  ANTERIOR MEDIASTINAL MASS  PROCEDURE: REDO STERNOTOMY, RESECTION OF ANTERIOR MEDIASTINAL MASS   SURGEON:  Surgeon(s) and Role:    Melrose Nakayama, MD - Primary  PHYSICIAN ASSISTANT: Lars Pinks PA-C  ANESTHESIA:   general  EBL:  200 mL   BLOOD ADMINISTERED:none  DRAINS: 2 78 Blake drains placed in the right pleural space and mediastinal space   SPECIMEN:  Source of Specimen:  Anterior mediastinal mass  DISPOSITION OF SPECIMEN:  PATHOLOGY  COUNTS CORRECT:  YES  DICTATION: .Dragon Dictation  PLAN OF CARE: Admit to inpatient   PATIENT DISPOSITION:  PACU - hemodynamically stable.   Delay start of Pharmacological VTE agent (>24hrs) due to surgical blood loss or risk of bleeding: no

## 2018-06-02 NOTE — Op Note (Signed)
NAME: Thomas Wright, Thomas Wright MEDICAL RECORD [id number] ACCOUNT [id number] DATE OF BIRTH:07/22/1946 FACILITY: [facility] LOCATION: [facility] PHYSICIAN:Aarushi Hemric Chaya Jan, MD  OPERATIVE REPORT  DATE OF PROCEDURE:  06/02/2018  PREOPERATIVE DIAGNOSIS:  Anterior mediastinal cyst.  POSTOPERATIVE DIAGNOSIS:  Anterior mediastinal cyst.  PROCEDURE:  Redo median sternotomy, resection of anterior mediastinal cystic mass.  SURGEON:  Modesto Charon, MD  ASSISTANT:  Lars Pinks, PA-C  ANESTHESIA:  General.  FINDINGS:  Approximately 2 cm cystic lesion just to the right of midline in the anterior mediastinal fat. resected with clear gross margins.  CLINICAL NOTE:  The patient is a 72 year old man with a complex cardiac history including previous sternotomy for aortic valve replacement.  He was found to have a 1.7 cm anterior mediastinal mass.  This turned out to appear cystic and was not  hypermetabolic on PET CT. However, on followup CT, it had increased in size and he was advised to have it resected.  The indications, risks, benefits, and alternatives were discussed in detail with the patient.  He understood and accepted the risks and  agreed to proceed.  OPERATIVE NOTE:  The patient was brought to the preoperative holding area on 06/02/2018.  Anesthesia placed a central line and an arterial blood pressure monitoring line.  He was taken to the Operating Room, anesthetized and intubated.  A Foley catheter was  placed.  Sequential compression devices were placed on the calves for DVT prophylaxis.  Intravenous antibiotics were administered.  The chest, abdomen and groins were prepped and draped in the usual sterile fashion.  After performing a timeout, a redo sternotomy was performed.  An incision was made through the previous scar.  The wires were removed.  The sternum was divided with an oscillating saw.  Hemostasis was achieved and a retractor was placed.  The right  pleural space had been entered  with the sternotomy.  The pericardium had not been closed inferiorly.  More superiorly, there was a mediastinal fatty tissue, but no clear planes for the thymus.  The anterior mediastinal fat pad was resected beginning along  the right side.  Care was taken to stay well anterior to the phrenic nerve.  Small vessels were cauterized.  Larger vessels were doubly clipped and divided.  As the dissection was carried superiorly to the level of the innominate, the cystic lesion could  be seen.  The part that was visible appeared relatively smooth walled.  The dissection then was carried superiorly and the superior horns of the thymus were removed with the fat pad.  The anterior mediastinal fat pad then was dissected off the  innominate vein.  There was a large thymic vein, which was doubly clipped and divided.  The tissue on the left side was divided.  The entire thymus was not dissected out on the left side.  The specimen was sent for permanent pathology.  The wound was  copiously irrigated with warm saline.  Hemostasis was achieved.  A 28-French Blake drain was placed in the right pleural space and a second one was placed into the anterior mediastinum.  They were both secured with #1 silk sutures.  The sternum was  closed with a combination of single and double heavy gauge stainless steel wires.  The pectoralis fascia, subcutaneous tissue and skin were closed in standard fashion.  All sponge, needle and instrument counts were correct at the end of the procedure.   The patient was taken from the operating room to the [facility], extubated in good condition.  AN/NUANCE  D:06/02/2018 T:06/02/2018 JOB:[id number]

## 2018-06-02 NOTE — Anesthesia Postprocedure Evaluation (Signed)
Anesthesia Post Note  Patient: Thomas Wright  Procedure(s) Performed: REDO STERNOTOMY (N/A ) RESECTION OF ANTERIOR MEDIASTINAL MASS (N/A )     Patient location during evaluation: PACU Anesthesia Type: General Level of consciousness: awake Pain management: pain level controlled Vital Signs Assessment: post-procedure vital signs reviewed and stable Respiratory status: spontaneous breathing Cardiovascular status: stable Postop Assessment: no apparent nausea or vomiting Anesthetic complications: no    Last Vitals:  Vitals:   06/02/18 1050 06/02/18 1108  BP: (!) 163/77 (!) 160/65  Pulse: 64 65  Resp: (!) 6 17  Temp:    SpO2: 96% (!) 85%    Last Pain:  Vitals:   06/02/18 1050  TempSrc:   PainSc: 8                  Felton Buczynski

## 2018-06-02 NOTE — Telephone Encounter (Signed)
Pt last saw Dr Marlou Porch 04/04/18, last labs 05/28/18 Creat 1.41, age 72, weight 114.3kg, based on specified criteria pt is on appropriate dosage of Eliquis 5mg  BID.  Will refill rx.

## 2018-06-02 NOTE — Anesthesia Procedure Notes (Addendum)
Central Venous Catheter Insertion Performed by: Suzette Battiest, MD, anesthesiologist Start/End9/28/2019 7:15 AM, 06/14/2018 7:25 AM Patient location: Pre-op. Preanesthetic checklist: patient identified, IV checked, site marked, risks and benefits discussed, surgical consent, monitors and equipment checked, pre-op evaluation, timeout performed and anesthesia consent Position: Trendelenburg Lidocaine 1% used for infiltration and patient sedated Hand hygiene performed , maximum sterile barriers used  and Seldinger technique used Catheter size: 8.5 Fr Total catheter length 10. Central line was placed.Sheath introducer Swan type:thermodilution Procedure performed using ultrasound guided technique. Ultrasound Notes:anatomy identified, needle tip was noted to be adjacent to the nerve/plexus identified, no ultrasound evidence of intravascular and/or intraneural injection and image(s) printed for medical record Attempts: 2 Following insertion, line sutured, dressing applied and Biopatch. Post procedure assessment: blood return through all ports, free fluid flow and no air  Patient tolerated the procedure well with no immediate complications. Additional procedure comments: Right IJ patent and visible on ultrasound. Needle advanced under direct u/s guidance. Blood aspirated and catheter easily passed with blood flow. Attempted wire pass with resistance met. Aborted on right side and left side prepped and draped. Vessel identified and u/s used for guidance of aspiration needle. Catheter threaded. Wire passed easily and introducer placed over wire without problem. Rest of details as above.  Deatra Canter, MD.

## 2018-06-02 NOTE — Anesthesia Procedure Notes (Signed)
Procedure Name: Intubation Date/Time: 06/02/2018 7:52 AM Performed by: Lance Coon, CRNA Pre-anesthesia Checklist: Patient identified, Emergency Drugs available, Suction available and Patient being monitored Patient Re-evaluated:Patient Re-evaluated prior to induction Oxygen Delivery Method: Circle System Utilized Preoxygenation: Pre-oxygenation with 100% oxygen Induction Type: IV induction Ventilation: Mask ventilation without difficulty Laryngoscope Size: Glidescope and 4 Grade View: Grade I Tube type: Oral Tube size: 7.5 mm Number of attempts: 1 Airway Equipment and Method: Stylet Placement Confirmation: ETT inserted through vocal cords under direct vision,  positive ETCO2 and breath sounds checked- equal and bilateral Secured at: 23 cm Tube secured with: Tape Dental Injury: Teeth and Oropharynx as per pre-operative assessment

## 2018-06-03 ENCOUNTER — Encounter (HOSPITAL_COMMUNITY): Payer: Self-pay | Admitting: Thoracic Surgery (Cardiothoracic Vascular Surgery)

## 2018-06-03 ENCOUNTER — Inpatient Hospital Stay (HOSPITAL_COMMUNITY): Payer: Medicare HMO

## 2018-06-03 LAB — CBC
HCT: 41.2 % (ref 39.0–52.0)
Hemoglobin: 13.3 g/dL (ref 13.0–17.0)
MCH: 27.8 pg (ref 26.0–34.0)
MCHC: 32.3 g/dL (ref 30.0–36.0)
MCV: 86.2 fL (ref 78.0–100.0)
PLATELETS: 224 10*3/uL (ref 150–400)
RBC: 4.78 MIL/uL (ref 4.22–5.81)
RDW: 13.2 % (ref 11.5–15.5)
WBC: 17.4 10*3/uL — ABNORMAL HIGH (ref 4.0–10.5)

## 2018-06-03 LAB — GLUCOSE, CAPILLARY
GLUCOSE-CAPILLARY: 170 mg/dL — AB (ref 70–99)
GLUCOSE-CAPILLARY: 166 mg/dL — AB (ref 70–99)
GLUCOSE-CAPILLARY: 192 mg/dL — AB (ref 70–99)
GLUCOSE-CAPILLARY: 191 mg/dL — AB (ref 70–99)
Glucose-Capillary: 144 mg/dL — ABNORMAL HIGH (ref 70–99)
Glucose-Capillary: 215 mg/dL — ABNORMAL HIGH (ref 70–99)
Glucose-Capillary: 224 mg/dL — ABNORMAL HIGH (ref 70–99)

## 2018-06-03 LAB — BASIC METABOLIC PANEL
Anion gap: 14 (ref 5–15)
BUN: 27 mg/dL — AB (ref 8–23)
CO2: 20 mmol/L — AB (ref 22–32)
CREATININE: 1.25 mg/dL — AB (ref 0.61–1.24)
Calcium: 8.6 mg/dL — ABNORMAL LOW (ref 8.9–10.3)
Chloride: 100 mmol/L (ref 98–111)
GFR calc Af Amer: >60 mL/min (ref >60)
GFR calc non Af Amer: 57 mL/min — ABNORMAL LOW (ref >60)
GLUCOSE: 198 mg/dL — AB (ref 70–99)
Potassium: 4.3 mmol/L (ref 3.5–5.1)
Sodium: 134 mmol/L — ABNORMAL LOW (ref 135–145)

## 2018-06-03 LAB — BLOOD GAS, ARTERIAL
ACID-BASE DEFICIT: 2.2 mmol/L — AB (ref 0.0–2.0)
BICARBONATE: 22.7 mmol/L (ref 20.0–28.0)
O2 Content: 2 L/min
O2 Saturation: 97.6 %
PCO2 ART: 42.8 mmHg (ref 32.0–48.0)
PH ART: 7.344 — AB (ref 7.350–7.450)
Patient temperature: 98.6
pO2, Arterial: 95.1 mmHg (ref 83.0–108.0)

## 2018-06-03 MED ORDER — INSULIN ASPART 100 UNIT/ML ~~LOC~~ SOLN
3.0000 [IU] | Freq: Three times a day (TID) | SUBCUTANEOUS | Status: DC
  Administered 2018-06-03 – 2018-06-05 (×8): 3 [IU] via SUBCUTANEOUS

## 2018-06-03 MED ORDER — INSULIN GLARGINE 100 UNIT/ML ~~LOC~~ SOLN
32.0000 [IU] | Freq: Every day | SUBCUTANEOUS | Status: DC
  Administered 2018-06-03 – 2018-06-05 (×3): 32 [IU] via SUBCUTANEOUS
  Filled 2018-06-03 (×3): qty 0.32

## 2018-06-03 MED ORDER — METFORMIN HCL ER 500 MG PO TB24
500.0000 mg | ORAL_TABLET | Freq: Two times a day (BID) | ORAL | Status: DC
  Administered 2018-06-03 – 2018-06-06 (×7): 500 mg via ORAL
  Filled 2018-06-03 (×7): qty 1

## 2018-06-03 MED ORDER — INSULIN ASPART 100 UNIT/ML ~~LOC~~ SOLN: 0.0000 [IU] | Freq: Every day | SUBCUTANEOUS | Status: DC

## 2018-06-03 MED ORDER — CARVEDILOL 6.25 MG PO TABS
6.2500 mg | ORAL_TABLET | Freq: Two times a day (BID) | ORAL | Status: DC
  Administered 2018-06-03 – 2018-06-06 (×6): 6.25 mg via ORAL
  Filled 2018-06-03 (×6): qty 1

## 2018-06-03 MED ORDER — LINAGLIPTIN 5 MG PO TABS
5.0000 mg | ORAL_TABLET | Freq: Every day | ORAL | Status: DC
  Administered 2018-06-03 – 2018-06-06 (×4): 5 mg via ORAL
  Filled 2018-06-03 (×4): qty 1

## 2018-06-03 MED ORDER — INSULIN ASPART 100 UNIT/ML ~~LOC~~ SOLN
0.0000 [IU] | Freq: Three times a day (TID) | SUBCUTANEOUS | Status: DC
  Administered 2018-06-03: 4 [IU] via SUBCUTANEOUS
  Administered 2018-06-03: 7 [IU] via SUBCUTANEOUS
  Administered 2018-06-04: 4 [IU] via SUBCUTANEOUS
  Administered 2018-06-04 (×2): 3 [IU] via SUBCUTANEOUS

## 2018-06-03 MED ORDER — CANAGLIFLOZIN 100 MG PO TABS
100.0000 mg | ORAL_TABLET | Freq: Every day | ORAL | Status: DC
  Administered 2018-06-03 – 2018-06-06 (×4): 100 mg via ORAL
  Filled 2018-06-03 (×4): qty 1

## 2018-06-03 NOTE — Discharge Summary (Addendum)
Physician Discharge Summary       [address]       ,[place] [address]             [phone]    Patient ID: [name] MRN: [id number] DOB/AGE: Jun 17, 1946 72 y.o.  Admit date: 06/02/2018 Discharge date: 06/06/2018  Admission Diagnoses: Anterior mediastinal cyst  Discharge Diagnoses:  1. Anterior mediastinal cyst 2. History of Cardiomyopathy (HCC)-EF originally 20%-now 45% 3. History of Chronic combined systolic and diastolic CHF (congestive heart failure) ([place]) 4. History of Hyperlipidemia 5. History of Hypertension 6. History of PAF (paroxysmal atrial fibrillation) ([place]) 7. History of S/P aortic valve replacement with bioprosthetic valve 8. History of Type II diabetes mellitus ([place])  Procedure (s):  Redo median sternotomy, resection of anterior mediastinal cystic mass by Dr. Roxan Hockey on 06/02/2018.  Pathology:  Mediastinum, mass resection, Anterior - BENIGN CYST. - ATROPHIC THYMIC TISSUE. - THERE IS NO EVIDENCE OF MALIGNANCY.  History of Presenting Illness: Thomas Wright is a 72 year old man with a history of bioprosthetic aortic valve replacement in 2008, paroxysmal atrial fibrillation, cardiomyopathy, chronic combined systolic and diastolic congestive heart failure with ejection fraction of 25%, hypertension, hyperlipidemia, type 2 diabetes, arthritis, right below-knee amputation, and chronic dyspnea. He was being evaluated for chronic dyspnea with a high-resolution chest CT. There is no evidence of interstitial lung disease. He did have a 1.7 cm anterior mediastinal "mass." That had been present previously but had grown slightly. I saw him in August 2018. We scheduled a PET CT. That showed the nodule was photopenic. He then was admitted to the hospital and lost to follow-up with me. He did undergo cardiac catheterization during that hospitalization which showed normal coronaries.  He had another CT in January of this year which showed no  change in the anterior mediastinal cyst. He recently had a follow-up CT which showed interval growth from 1.3 x 1.7 cm to 1.5 x 2 cm.  He was having trouble with fluid retention a few weeks ago. He was given extra Lasix for 5 days and that resolved and he has not had any further issues with that. 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 Ejection fraction was unchanged to 25%.  He is limited in his physical activity. He continues to have chronic dyspnea. He has not had any chest pain, pressure, or tightness either at rest or with exertion. He has noted some lightheadedness when he bends over to feed his dogs. His appetite is good. He has not had any significant weight loss.  Dr. Roxan Hockey described the general nature of redo sternotomy for resection of the anterior mediastinal mass to him. Patient understands that itwould be done in the operating room under general anesthesia. Given his physical limitations, Dr. Roxan Hockey  would expect him to be in the hospital for approximately 5 days afterwards. They discussed the indications, risks, benefits, and alternatives. He understands the alternative is radiographic follow-up. However, he is already shown interval change with radiographic follow-up. Potential risks, benefits, and complications of the surgery were discussed with the patient and he agreed to proceed with surgery. He underwent a redo sternotomy, resection of anterior mediastinal cystic mass on 06/02/2018.  Brief Hospital Course:  He remained afebrile and hemodynamically stable. He was restarted on Carvedilol and Amiodarone. Apixaban was restarted on 09/18. CXR on 09/17 appeared to show low lung volumes, no pneumothorax. Chest tubes were removed on post op day one.  Patient was felt surgically stable for transfer from the ICU to 4E  for further convalescence on 06/03/2018. He is ambulating on room air. His  wound is clean, dry, and continuing to heal. He is tolerating diet. He is felt surgically stable for discharge today.   Latest Vital Signs: Blood pressure (!) 118/55, pulse 74, temperature 98.4 F (36.9 C), temperature source Oral, resp. rate 18, height 6' (1.829 m), weight 113.9 kg, SpO2 95 %.  Physical Exam:  General appearance: alert, cooperative and no distress Heart: regular rate and rhythm Lungs: clear to auscultation bilaterally Abdomen: soft, non-tender; bowel sounds normal; no masses,  no organomegaly Extremities: RLE BKA, no edema on LLE Wound: clean and dry  Discharge Condition: Stable and discharged to home.  Recent laboratory studies:  Lab Results  Component Value Date   WBC 16.6 (H) 06/04/2018   HGB 13.1 06/04/2018   HCT 40.7 06/04/2018   MCV 86.4 06/04/2018   PLT 226 06/04/2018   Lab Results  Component Value Date   NA 136 06/05/2018   K 3.8 06/05/2018   CL 101 06/05/2018   CO2 25 06/05/2018   CREATININE 1.13 06/05/2018   GLUCOSE 79 06/05/2018      Diagnostic Studies: Dg Chest 2 View  Result Date: 06/04/2018 CLINICAL DATA:  Atelectasis, dyspnea EXAM: CHEST - 2 VIEW COMPARISON:  Chest radiograph from one day prior. FINDINGS: Intact sternotomy wires. Aortic valve prosthesis is in place. Stable cardiomediastinal silhouette with mild cardiomegaly. No pneumothorax. No pleural effusion. Low lung volumes. Vascular crowding without overt pulmonary edema. Improved mild bibasilar atelectasis. IMPRESSION: 1. No pneumothorax. 2. Stable mild cardiomegaly without overt pulmonary edema. 3. Low lung volumes with improved mild bibasilar atelectasis. Electronically Signed   By: Ilona Sorrel M.D.   On: 06/04/2018 10:16   Dg Chest 2 View  Result Date: 06/02/2018 CLINICAL DATA:  Preoperative evaluation for mediastinal mass. EXAM: CHEST - 2 VIEW COMPARISON:  CT chest April 09, 2018 FINDINGS: Low inspiratory examination crowded vascular markings, mildly elevated RIGHT hemidiaphragm.  No pleural effusion or focal consolidation. Status post median sternotomy for cardiac valve replacement. Cardiomediastinal silhouette is normal. No pneumothorax. Soft tissue planes and included osseous structures are non suspicious. Mild degenerative changes thoracic spine. IMPRESSION: No acute cardiopulmonary process for this low inspiratory portable examination. Electronically Signed   By: Elon Alas M.D.   On: 06/02/2018 06:50   Dg Chest Port 1 View  Result Date: 06/03/2018 CLINICAL DATA:  72 year old male status post median sternotomy for aortic valvular replacement EXAM: PORTABLE CHEST 1 VIEW COMPARISON:  Prior chest x-ray 06/02/2018 FINDINGS: Interval removal of left IJ Cordis sheath. The mediastinal drain and right-sided chest tube remain in stable position. Patient is status post median sternotomy with evidence of aortic valve replacement. Stable cardiomegaly and mediastinal contours. Inspiratory volumes remain very low with bibasilar atelectasis. Mild pulmonary vascular congestion without overt edema. No evidence of pneumothorax. No acute osseous abnormality. IMPRESSION: 1. Interval removal of left IJ Cordis sheath. Remaining support apparatus in stable and satisfactory position. 2. Persistent very low inspiratory volumes with bibasilar atelectasis and mild vascular congestion. 3. No pneumothorax identified on today's examination. Electronically Signed   By: Jacqulynn Cadet M.D.   On: 06/03/2018 09:17   Dg Chest Port 1 View  Result Date: 06/02/2018 CLINICAL DATA:  72 year old male. Post redo sternotomy and resection of anterior mediastinal mass. Subsequent encounter. EXAM: PORTABLE CHEST 1 VIEW COMPARISON:  06/03/2015 chest x-ray.  04/09/2018 chest CT. FINDINGS: Left central line tip projects  over the aortic arch level. This may be within the left brachiocephalic vein. Correlation with blood return recommended. Post redo sternotomy. Post aortic valve replacement. Right-sided chest  tube/mediastinal drain in place. Cannot exclude tiny apical pneumothorax on either side. Right base subsegmental atelectasis with elevated right hemidiaphragm. Poor inspiration. Heart appears enlarged. Central pulmonary vascular prominence. Calcified mildly tortuous aorta. IMPRESSION: 1. Left central line tip projects over the aortic arch level. This may be within the left brachiocephalic vein (cannot confirm without lateral view). Correlation with blood return recommended. 2. Questionable tiny apical pneumothorax. 3. Right base subsegmental atelectasis. 4. Cardiomegaly with central pulmonary vascular prominence may be accentuated by decreased degree of inspiration. 5.  Aortic Atherosclerosis (ICD10-I70.0). These results will be called to the ordering clinician or representative by the Radiologist Assistant, and communication documented in the PACS or zVision Dashboard. Electronically Signed   By: Genia Del M.D.   On: 06/02/2018 10:22     Discharge Medications: Allergies as of 06/06/2018      Reactions   Dulaglutide Nausea And Vomiting   TRULICITY Intractable nausea and vomiting resulting in hospitalization   Amoxicill-clarithro-omeprazole Diarrhea, Nausea And Vomiting   The mix Is what the patient can not take      Medication List    TAKE these medications   acetaminophen 500 MG tablet Commonly known as:  TYLENOL Take 2 tablets (1,000 mg total) by mouth every 6 (six) hours as needed for mild pain.   amiodarone 100 MG tablet Commonly known as:  PACERONE Take 1 tablet (100 mg total) by mouth daily.   atorvastatin 10 MG tablet Commonly known as:  LIPITOR Take 1 tablet (10 mg total) by mouth every morning.   b complex vitamins tablet Take 1 tablet by mouth daily.   carvedilol 6.25 MG tablet Commonly known as:  COREG TAKE 1 TABLET TWO TIMES DAILY WITH A MEAL. What changed:  See the new instructions.   ELIQUIS 5 MG Tabs tablet Generic drug:  apixaban TAKE 1 TABLET TWICE DAILY     empagliflozin 10 MG Tabs tablet Commonly known as:  JARDIANCE Take 10 mg by mouth at bedtime.   furosemide 40 MG tablet Commonly known as:  LASIX Take 1 tablet (40 mg total) by mouth daily.   insulin regular 100 units/mL injection Commonly known as:  NOVOLIN R,HUMULIN R Inject 0.03 mLs (3 Units total) into the skin 3 (three) times daily before meals. What changed:    how much to take  additional instructions   JANUVIA 100 MG tablet Generic drug:  sitaGLIPtin Take 100 mg by mouth daily.   metFORMIN 500 MG 24 hr tablet Commonly known as:  GLUCOPHAGE-XR Take 500 mg by mouth 2 (two) times daily.   TOUJEO SOLOSTAR 300 UNIT/ML Sopn Generic drug:  Insulin Glargine Inject 32 Units into the skin at bedtime.   traMADol 50 MG tablet Commonly known as:  ULTRAM Take 1 tablet (50 mg total) by mouth every 6 (six) hours as needed (mild pain).       Follow Up Appointments: Follow-up Information    Melrose Nakayama, MD. Go on 07/08/2018.   Specialty:  Cardiothoracic Surgery Why:  PA/LAT CXR to be taken (at [facility] which is in the same building as Dr. Leonarda Salon office) on 07/08/2018 at 3:15 pm;Appointment time is at 3:45 pm Contact information: [place] [address] [phone]        Burtis Junes, NP. Go on 07/08/2018.   Specialties:  Nurse Practitioner, Interventional Cardiology, Cardiology, Radiology Why:  Appointment time is at 9:30 am Contact information: [place]. [address] [place] [address] [phone]           Signed: Ellamae Sia 06/06/2018, 8:31 AM

## 2018-06-03 NOTE — Progress Notes (Signed)
Patient arrived from [facility] to 4E room 14.  Telemetry monitor applied and CCMD notified.  Patient oriented to unit and room to include call light and phone.  Will continue to monitor.

## 2018-06-03 NOTE — Progress Notes (Signed)
1 Day Post-Op Procedure(s) (LRB): REDO STERNOTOMY (N/A) RESECTION OF ANTERIOR MEDIASTINAL MASS (N/A) Subjective: Some pain 4/10, better than last night  Objective: Vital signs in last 24 hours: Temp:  [97 F (36.1 C)-98.4 F (36.9 C)] 98.3 F (36.8 C) (09/17 0722) Pulse Rate:  [56-92] 79 (09/17 0600) Cardiac Rhythm: Normal sinus rhythm (09/17 0400) Resp:  [6-23] 18 (09/17 0600) BP: (103-201)/(51-85) 142/63 (09/17 0600) SpO2:  [95 %-100 %] 99 % (09/17 0600) Arterial Line BP: (112-198)/(38-77) 158/59 (09/17 0600) Weight:  [117.2 kg] 117.2 kg (09/17 0600)  Hemodynamic parameters for last 24 hours:    Intake/Output from previous day: 09/16 0701 - 09/17 0700 In: 2106 [I.V.:2106] Out: 3195 [Urine:2655; Blood:200; Chest Tube:340] Intake/Output this shift: No intake/output data recorded.  General appearance: alert, cooperative and no distress Neurologic: intact Heart: regular rate and rhythm Lungs: diminished breath sounds bibasilar Abdomen: normal findings: soft, non-tender  Lab Results: Recent Labs    06/03/18 0417  WBC 17.4*  HGB 13.3  HCT 41.2  PLT 224   BMET:  Recent Labs    06/03/18 0417  NA 134*  K 4.3  CL 100  CO2 20*  GLUCOSE 198*  BUN 27*  CREATININE 1.25*  CALCIUM 8.6*    PT/INR: No results for input(s): LABPROT, INR in the last 72 hours. ABG    Component Value Date/Time   PHART 7.344 (L) 06/03/2018 0424   HCO3 22.7 06/03/2018 0424   TCO2 28 03/01/2016 0152   ACIDBASEDEF 2.2 (H) 06/03/2018 0424   O2SAT 97.6 06/03/2018 0424   CBG (last 3)  Recent Labs    06/03/18 0557 06/03/18 0646 06/03/18 0719  GLUCAP 170* 166* 144*    Assessment/Plan: S/P Procedure(s) (LRB): REDO STERNOTOMY (N/A) RESECTION OF ANTERIOR MEDIASTINAL MASS (N/A) Plan for transfer to step-down: see transfer orders  POD # 1 doing well  CV- in SR with PVCs, BP mildly elevated  Continue coreg, amiodarone  Restart Eliquis tomorrow  RESP- IS for basilar  atelectasis  RENAL- creatinine below baseline, lytes OK  ENDO_ CBG elevated, restart PO meds  GI advance diet  Dc chest tubes  Ambulate    LOS: 1 day    Melrose Nakayama 06/03/2018

## 2018-06-04 ENCOUNTER — Inpatient Hospital Stay (HOSPITAL_COMMUNITY): Payer: Medicare HMO

## 2018-06-04 LAB — GLUCOSE, CAPILLARY
GLUCOSE-CAPILLARY: 123 mg/dL — AB (ref 70–99)
Glucose-Capillary: 139 mg/dL — ABNORMAL HIGH (ref 70–99)
Glucose-Capillary: 151 mg/dL — ABNORMAL HIGH (ref 70–99)

## 2018-06-04 LAB — BASIC METABOLIC PANEL
Anion gap: 9 (ref 5–15)
BUN: 30 mg/dL — AB (ref 8–23)
CALCIUM: 8.4 mg/dL — AB (ref 8.9–10.3)
CO2: 25 mmol/L (ref 22–32)
CREATININE: 1.34 mg/dL — AB (ref 0.61–1.24)
Chloride: 101 mmol/L (ref 98–111)
GFR calc Af Amer: >60 mL/min (ref >60)
GFR, EST NON AFRICAN AMERICAN: 52 mL/min — AB (ref >60)
GLUCOSE: 147 mg/dL — AB (ref 70–99)
POTASSIUM: 4 mmol/L (ref 3.5–5.1)
SODIUM: 135 mmol/L (ref 135–145)

## 2018-06-04 LAB — CBC
HCT: 40.7 % (ref 39.0–52.0)
Hemoglobin: 13.1 g/dL (ref 13.0–17.0)
MCH: 27.8 pg (ref 26.0–34.0)
MCHC: 32.2 g/dL (ref 30.0–36.0)
MCV: 86.4 fL (ref 78.0–100.0)
PLATELETS: 226 10*3/uL (ref 150–400)
RBC: 4.71 MIL/uL (ref 4.22–5.81)
RDW: 13.3 % (ref 11.5–15.5)
WBC: 16.6 10*3/uL — ABNORMAL HIGH (ref 4.0–10.5)

## 2018-06-04 MED ORDER — MENTHOL 3 MG MT LOZG
1.0000 | LOZENGE | OROMUCOSAL | Status: DC | PRN
  Filled 2018-06-04: qty 9

## 2018-06-04 NOTE — Progress Notes (Signed)
Encouraged patient to get up to chair to day patient declined. Will monitor patient. Loyola Santino, Bettina Gavia RN

## 2018-06-04 NOTE — Discharge Instructions (Signed)
Discharge Instructions:  1. You may shower, please wash incisions daily with soap and water and keep dry.  If you wish to cover wounds with dressing you may do so but please keep clean and change daily.  No tub baths or swimming until incisions have completely healed.  If your incisions become red or develop any drainage please call our office at [phone]  2. No Driving until cleared by our office and you are no longer using narcotic pain medications  3. Monitor your weight daily.. Please use the same scale and weigh at same time... If you gain 5-10 lbs in 48 hours with associated lower extremity swelling, please contact our office at [phone]  4. Fever of 101.5 for at least 24 hours with no source, please contact our office at [phone]  5. Activity- up as tolerated, please walk at least 3 times per day.  Avoid strenuous activity, no lifting, pushing, or pulling with your arms over 8-10 lbs for a minimum of 6 weeks  6. If any questions or concerns arise, please do not hesitate to contact our office at [phone]      Information on my medicine - ELIQUIS (apixaban)  Why was Eliquis prescribed for you? Eliquis was prescribed for you to reduce the risk of a blood clot forming that can cause a stroke if you have a medical condition called atrial fibrillation (a type of irregular heartbeat).  What do You need to know about Eliquis ? Take your Eliquis TWICE DAILY - one tablet in the morning and one tablet in the evening with or without food. If you have difficulty swallowing the tablet whole please discuss with your pharmacist how to take the medication safely.  Take Eliquis exactly as prescribed by your doctor and DO NOT stop taking Eliquis without talking to the doctor who prescribed the medication.  Stopping may increase your risk of developing a stroke.  Refill your prescription before you run out.  After discharge, you should have regular check-up appointments with  your healthcare provider that is prescribing your Eliquis.  In the future your dose may need to be changed if your kidney function or weight changes by a significant amount or as you get older.  What do you do if you miss a dose? If you miss a dose, take it as soon as you remember on the same day and resume taking twice daily.  Do not take more than one dose of ELIQUIS at the same time to make up a missed dose.  Important Safety Information A possible side effect of Eliquis is bleeding. You should call your healthcare provider right away if you experience any of the following: ? Bleeding from an injury or your nose that does not stop. ? Unusual colored urine (red or dark brown) or unusual colored stools (red or black). ? Unusual bruising for unknown reasons. ? A serious fall or if you hit your head (even if there is no bleeding).  Some medicines may interact with Eliquis and might increase your risk of bleeding or clotting while on Eliquis. To help avoid this, consult your healthcare provider or pharmacist prior to using any new prescription or non-prescription medications, including herbals, vitamins, non-steroidal anti-inflammatory drugs (NSAIDs) and supplements.  This website has more information on Eliquis (apixaban): http://www.eliquis.com/eliquis/home

## 2018-06-04 NOTE — Progress Notes (Addendum)
      [address]       [place] [address]             [phone]       2 Days Post-Op Procedure(s) (LRB): REDO STERNOTOMY (N/A) RESECTION OF ANTERIOR MEDIASTINAL MASS (N/A)  Subjective: Patient with incisional pain and sore throat  Objective: Vital signs in last 24 hours: Temp:  [97.9 F (36.6 C)-98.5 F (36.9 C)] 97.9 F (36.6 C) (09/18 0350) Pulse Rate:  [72-82] 72 (09/18 0350) Cardiac Rhythm: Bundle branch block;Heart block (09/17 2105) Resp:  [16-26] 16 (09/18 0350) BP: (106-136)/(48-117) 129/67 (09/18 0350) SpO2:  [92 %-99 %] 93 % (09/18 0350) Arterial Line BP: (128)/(47) 128/47 (09/17 0800)     Intake/Output from previous day: 09/17 0701 - 09/18 0700 In: 270 [P.O.:240; I.V.:30] Out: 3235 [Urine:3225; Chest Tube:10]   Physical Exam:  Cardiovascular: RRR Pulmonary: Diminished at bases Abdomen: Soft, obese, non tender, bowel sounds present. Extremities: Trace LLE edema Wounds: Aquacel intact   Lab Results: CBC: Recent Labs    06/03/18 0417 06/04/18 0501  WBC 17.4* 16.6*  HGB 13.3 13.1  HCT 41.2 40.7  PLT 224 226   BMET:  Recent Labs    06/03/18 0417 06/04/18 0501  NA 134* 135  K 4.3 4.0  CL 100 101  CO2 20* 25  GLUCOSE 198* 147*  BUN 27* 30*  CREATININE 1.25* 1.34*  CALCIUM 8.6* 8.4*    PT/INR: No results for input(s): LABPROT, INR in the last 72 hours. ABG:  INR: Will add last result for INR, ABG once components are confirmed Will add last 4 CBG results once components are confirmed  Assessment/Plan:  1. CV - History of PAF. First degree heart block, previous PVCs. On Amiodarone 100 mg daily, Carvedilol 6.25 mg bid, and Eliquis will start today. 2.  Pulmonary - On room air. CXR this am appears stable. Encourage incentive spirometer. 3. Creatinine slightly increased from 1.25 to 1.34. On Lasix 40 mg daily and Metformin. Of note, creatinine 1.4 on admission. 4. DM-CBGs 192/191/123. On Canagliflozin 100 mg daily,  Linagliptin 5 mg daliy, Metformin XR 500 mg bid, and Insulin. 5. Cepacol lozenges PRN 6. Likely discharge on Friday  Donielle M [facility] 06/04/2018,7:09 AM [phone]  Patient seen and examined, agree with above Path- benign cyst  Revonda Standard. Roxan Hockey, MD [facility] [phone]

## 2018-06-05 LAB — TYPE AND SCREEN
ABO/RH(D): O POS
ANTIBODY SCREEN: NEGATIVE
UNIT DIVISION: 0
UNIT DIVISION: 0
Unit division: 0
Unit division: 0
Unit division: 0
Unit division: 0

## 2018-06-05 LAB — BASIC METABOLIC PANEL
ANION GAP: 10 (ref 5–15)
BUN: 23 mg/dL (ref 8–23)
CALCIUM: 8.5 mg/dL — AB (ref 8.9–10.3)
CO2: 25 mmol/L (ref 22–32)
Chloride: 101 mmol/L (ref 98–111)
Creatinine, Ser: 1.13 mg/dL (ref 0.61–1.24)
GFR calc Af Amer: >60 mL/min (ref >60)
Glucose, Bld: 79 mg/dL (ref 70–99)
Potassium: 3.8 mmol/L (ref 3.5–5.1)
SODIUM: 136 mmol/L (ref 135–145)

## 2018-06-05 LAB — BPAM RBC
BLOOD PRODUCT EXPIRATION DATE: 201910112359
BLOOD PRODUCT EXPIRATION DATE: 201910112359
BLOOD PRODUCT EXPIRATION DATE: 201910112359
BLOOD PRODUCT EXPIRATION DATE: 201910112359
Blood Product Expiration Date: 201910102359
Blood Product Expiration Date: 201910112359
ISSUE DATE / TIME: 201909160720
ISSUE DATE / TIME: 201909160720
ISSUE DATE / TIME: 201909160720
ISSUE DATE / TIME: 201909160720
ISSUE DATE / TIME: 201909182103
ISSUE DATE / TIME: 201909182148
UNIT TYPE AND RH: 5100
UNIT TYPE AND RH: 5100
UNIT TYPE AND RH: 5100
UNIT TYPE AND RH: 5100
Unit Type and Rh: 5100
Unit Type and Rh: 5100

## 2018-06-05 LAB — GLUCOSE, CAPILLARY
GLUCOSE-CAPILLARY: 110 mg/dL — AB (ref 70–99)
GLUCOSE-CAPILLARY: 102 mg/dL — AB (ref 70–99)

## 2018-06-05 NOTE — Plan of Care (Signed)
  Problem: Health Behavior/Discharge Planning: Goal: Ability to manage health-related needs will improve Outcome: Progressing   Problem: Clinical Measurements: Goal: Ability to maintain clinical measurements within normal limits will improve Outcome: Progressing Goal: Will remain free from infection Outcome: Progressing   

## 2018-06-05 NOTE — Care Management Important Message (Signed)
Important Message  Patient Details  Name: Thomas Wright MRN: [id number] Date of Birth: [birth year]   Medicare Important Message Given:  Yes    Orbie Pyo 06/05/2018, 3:05 PM

## 2018-06-05 NOTE — Progress Notes (Signed)
3 Days Post-Op Procedure(s) (LRB): REDO STERNOTOMY (N/A) RESECTION OF ANTERIOR MEDIASTINAL MASS (N/A) Subjective: Some soreness at incision but "not really pain"  Objective: Vital signs in last 24 hours: Temp:  [98.2 F (36.8 C)-98.3 F (36.8 C)] 98.3 F (36.8 C) (09/19 0403) Pulse Rate:  [66-73] 73 (09/19 0403) Cardiac Rhythm: Heart block (09/19 0700) Resp:  [17-23] 21 (09/19 0830) BP: (117-164)/(36-81) 143/65 (09/19 0830) SpO2:  [92 %-93 %] 92 % (09/19 0403) Weight:  [113.9 kg] 113.9 kg (09/19 0403)  Hemodynamic parameters for last 24 hours:    Intake/Output from previous day: 09/18 0701 - 09/19 0700 In: -  Out: 2750 [Urine:2750] Intake/Output this shift: Total I/O In: 480 [P.O.:480] Out: 550 [Urine:550]  General appearance: alert, cooperative and no distress Neurologic: intact Heart: regular rate and rhythm Lungs: clear to auscultation bilaterally Wound: dressing intact  Lab Results: Recent Labs    06/03/18 0417 06/04/18 0501  WBC 17.4* 16.6*  HGB 13.3 13.1  HCT 41.2 40.7  PLT 224 226   BMET:  Recent Labs    06/04/18 0501 06/05/18 0319  NA 135 136  K 4.0 3.8  CL 101 101  CO2 25 25  GLUCOSE 147* 79  BUN 30* 23  CREATININE 1.34* 1.13  CALCIUM 8.4* 8.5*    PT/INR: No results for input(s): LABPROT, INR in the last 72 hours. ABG    Component Value Date/Time   PHART 7.344 (L) 06/03/2018 0424   HCO3 22.7 06/03/2018 0424   TCO2 28 03/01/2016 0152   ACIDBASEDEF 2.2 (H) 06/03/2018 0424   O2SAT 97.6 06/03/2018 0424   CBG (last 3)  Recent Labs    06/04/18 1718 06/05/18 0556 06/05/18 1058  GLUCAP 151* 110* 102*    Assessment/Plan: S/P Procedure(s) (LRB): REDO STERNOTOMY (N/A) RESECTION OF ANTERIOR MEDIASTINAL MASS (N/A) Doing well  Needs to mobilize. Refused ambulation earlier today Emphasized importance of walking Home 24-48 hours if able to progress with mobility   LOS: 3 days    Thomas Wright 06/05/2018

## 2018-06-06 LAB — GLUCOSE, CAPILLARY
GLUCOSE-CAPILLARY: 133 mg/dL — AB (ref 70–99)
GLUCOSE-CAPILLARY: 113 mg/dL — AB (ref 70–99)
GLUCOSE-CAPILLARY: 145 mg/dL — AB (ref 70–99)
Glucose-Capillary: 139 mg/dL — ABNORMAL HIGH (ref 70–99)
Glucose-Capillary: 62 mg/dL — ABNORMAL LOW (ref 70–99)
Glucose-Capillary: 104 mg/dL — ABNORMAL HIGH (ref 70–99)
Glucose-Capillary: 125 mg/dL — ABNORMAL HIGH (ref 70–99)
Glucose-Capillary: 72 mg/dL (ref 70–99)

## 2018-06-06 MED ORDER — ACETAMINOPHEN 500 MG PO TABS: 1000.0000 mg | ORAL_TABLET | Freq: Four times a day (QID) | ORAL | 0 refills | Status: AC | PRN

## 2018-06-06 MED ORDER — TRAMADOL HCL 50 MG PO TABS: 50.0000 mg | ORAL_TABLET | Freq: Four times a day (QID) | ORAL | 0 refills | Status: DC | PRN

## 2018-06-06 MED FILL — traMADol HCL 50 MG TABS: 50 | 4 days supply | Qty: 30 | Fill #0

## 2018-06-06 NOTE — Progress Notes (Addendum)
      [address]       ,[place] [address]             [phone]      4 Days Post-Op Procedure(s) (LRB): REDO STERNOTOMY (N/A) RESECTION OF ANTERIOR MEDIASTINAL MASS (N/A)   Subjective:  No new complaints.  Feels great and is ready to go home.  Objective: Vital signs in last 24 hours: Temp:  [98 F (36.7 C)-98.4 F (36.9 C)] 98.4 F (36.9 C) (09/20 0446) Pulse Rate:  [74-77] 74 (09/20 0446) Cardiac Rhythm: Normal sinus rhythm;Heart block;Bundle branch block (09/19 1900) Resp:  [14-21] 18 (09/20 0446) BP: (104-143)/(55-78) 118/55 (09/20 0446) SpO2:  [94 %-98 %] 95 % (09/20 0446)  Intake/Output from previous day: 09/19 0701 - 09/20 0700 In: 1200 [P.O.:1200] Out: 2025 [Urine:2025]  General appearance: alert, cooperative and no distress Heart: regular rate and rhythm Lungs: clear to auscultation bilaterally Abdomen: soft, non-tender; bowel sounds normal; no masses,  no organomegaly Extremities: RLE BKA, 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 ABG    Component Value Date/Time   PHART 7.344 (L) 06/03/2018 0424   HCO3 22.7 06/03/2018 0424   TCO2 28 03/01/2016 0152   ACIDBASEDEF 2.2 (H) 06/03/2018 0424   O2SAT 97.6 06/03/2018 0424   CBG (last 3)  Recent Labs    06/05/18 0556 06/05/18 1058 06/06/18 0729  GLUCAP 110* 102* 125*    Assessment/Plan: S/P Procedure(s) (LRB): REDO STERNOTOMY (N/A) RESECTION OF ANTERIOR MEDIASTINAL MASS (N/A)  1. CV- NSR with PVCs- continue Coreg, Amiodarone 2. Pulm- no acute issues, continue IS 3. Renal-creatinine has been stable, lasix per home regimen 4. DM- sugars  controlled, continue home regimen 5. Dispo- patient stable, will plan to d/c home today   LOS: 4 days    Ellwood Handler 06/06/2018 Patient seen and examined, agree with above Home today Path- benign thymic cyst  Revonda Standard. Roxan Hockey, MD [facility] [phone]

## 2018-06-06 NOTE — Progress Notes (Signed)
Hypoglycemic Event  CBG: 62  Treatment: 15 GM carbohydrate snack  Symptoms: None  Follow-up CBG: Time:0640` CBG Result:72  Possible Reasons for Event: Inadequate meal intake  Comments/MD notified:pending    Kathlen Mody, Conni Elliot

## 2018-06-06 NOTE — Progress Notes (Signed)
Pt discharged home. IV and telemetry box removed. AVS reviewed with pt and pt's significant other. Pt left with all of his belongings. Pt left via wheelchair and was accompanied by a Wilfrid Lund.   Ara Kussmaul BSN, RN

## 2018-06-06 NOTE — Consult Note (Signed)
            [facility] CM Primary Care Navigator  06/06/2018  Thomas Wright [birth year] [id number]   Went to seepatient at the bedside to identify possible discharge needs buthe wasdischarged homeper staff.  Per MD note, patient was admitted for follow-up of an anterior mediastinal mass, status post redo median sternotomy, resection of anterior mediastinal cystic mass.  Patient has discharge instruction to follow-up withcardiothoracic surgery on 07/08/18.  Primary care provider's office is listed as providing transition of care (TOC) follow-up.   For additional questions please contact:  Edwena Felty A. Noelene Gang, BSN, RN-BC [facility] PRIMARY CARE Navigator Cell: [phone]

## 2018-06-06 NOTE — Progress Notes (Signed)
Chest tube sutures removed per order. Benzoin and steri-strips applied. Sites clean and dry.   Ara Kussmaul BSN, RN

## 2018-06-14 NOTE — Addendum Note (Signed)
Addendum  created 06/14/18 0858 by Suzette Battiest, MD   Intraprocedure Blocks edited, Sign clinical note

## 2018-07-07 ENCOUNTER — Other Ambulatory Visit: Payer: Self-pay | Admitting: Thoracic Surgery (Cardiothoracic Vascular Surgery)

## 2018-07-07 DIAGNOSIS — J9859 Other diseases of mediastinum, not elsewhere classified: Secondary | ICD-10-CM

## 2018-07-08 ENCOUNTER — Encounter: Payer: Self-pay | Admitting: Nurse Practitioner

## 2018-07-08 ENCOUNTER — Ambulatory Visit (INDEPENDENT_AMBULATORY_CARE_PROVIDER_SITE_OTHER): Payer: Medicare HMO | Admitting: Nurse Practitioner

## 2018-07-08 ENCOUNTER — Ambulatory Visit: Payer: Medicare HMO | Admitting: Thoracic Surgery (Cardiothoracic Vascular Surgery)

## 2018-07-08 VITALS — BP 140/70 | HR 104 | Ht 73.0 in | Wt 250.1 lb

## 2018-07-08 DIAGNOSIS — I77810 Thoracic aortic ectasia: Secondary | ICD-10-CM | POA: Diagnosis not present

## 2018-07-08 DIAGNOSIS — I480 Paroxysmal atrial fibrillation: Secondary | ICD-10-CM

## 2018-07-08 DIAGNOSIS — I5022 Chronic systolic (congestive) heart failure: Secondary | ICD-10-CM

## 2018-07-08 DIAGNOSIS — Z954 Presence of other heart-valve replacement: Secondary | ICD-10-CM | POA: Diagnosis not present

## 2018-07-08 LAB — HEPATIC FUNCTION PANEL
ALT: 11 IU/L (ref 0–44)
AST: 15 IU/L (ref 0–40)
Albumin: 3.9 g/dL (ref 3.5–4.8)
Alkaline Phosphatase: 87 IU/L (ref 39–117)
Bilirubin Total: 0.4 mg/dL (ref 0.0–1.2)
Bilirubin, Direct: 0.15 mg/dL (ref 0.00–0.40)
Total Protein: 7.1 g/dL (ref 6.0–8.5)

## 2018-07-08 LAB — BASIC METABOLIC PANEL
BUN/Creatinine Ratio: 16 (ref 10–24)
BUN: 20 mg/dL (ref 8–27)
CO2: 23 mmol/L (ref 20–29)
Calcium: 9.1 mg/dL (ref 8.6–10.2)
Chloride: 102 mmol/L (ref 96–106)
Creatinine, Ser: 1.24 mg/dL (ref 0.76–1.27)
GFR calc Af Amer: 68 mL/min/{1.73_m2} (ref >59)
GFR calc non Af Amer: 59 mL/min/{1.73_m2} — ABNORMAL LOW (ref >59)
Glucose: 193 mg/dL — ABNORMAL HIGH (ref 65–99)
Potassium: 5 mmol/L (ref 3.5–5.2)
Sodium: 141 mmol/L (ref 134–144)

## 2018-07-08 LAB — CBC
Hematocrit: 41.6 % (ref 37.5–51.0)
Hemoglobin: 13.9 g/dL (ref 13.0–17.7)
MCH: 28.1 pg (ref 26.6–33.0)
MCHC: 33.4 g/dL (ref 31.5–35.7)
MCV: 84 fL (ref 79–97)
Platelets: 309 10*3/uL (ref 150–450)
RBC: 4.94 x10E6/uL (ref 4.14–5.80)
RDW: 13.2 % (ref 12.3–15.4)
WBC: 9.3 10*3/uL (ref 3.4–10.8)

## 2018-07-08 LAB — PRO B NATRIURETIC PEPTIDE: NT-Pro BNP: 4537 pg/mL — ABNORMAL HIGH (ref 0–376)

## 2018-07-08 LAB — TSH: TSH: 1.81 u[IU]/mL (ref 0.450–4.500)

## 2018-07-08 MED ORDER — AMIODARONE HCL 200 MG PO TABS: 200.0000 mg | ORAL_TABLET | Freq: Two times a day (BID) | ORAL | 3 refills | Status: DC

## 2018-07-08 MED ORDER — FUROSEMIDE 40 MG PO TABS: 40.0000 mg | ORAL_TABLET | Freq: Every day | ORAL | 3 refills | Status: DC

## 2018-07-08 NOTE — Progress Notes (Signed)
CARDIOLOGY OFFICE NOTE  Date:  07/08/2018    Thomas Wright Date of Birth: 1946/07/26 Medical Record #[id number]  PCP:  Leighton Ruff, MD  Cardiologist:  Marisa Cyphers    Chief Complaint  Patient presents with  . Congestive Heart Failure    Follow up visit - seen for Dr. Marlou Porch    History of Present Illness: Thomas Wright is a 72 y.o. male who presents today for a follow up visit. Seen for Dr. Marlou Porch.   He has a history of a bioprosthetic aortic valve replacement 05/28/2007 by Dr. Earnstine Regal in [place].Marland Wright This was a 29 mm bioprosthetic Edwards valve.  At the time his ejection fraction was 30% with aortic root dilatation of 4.5 cm.  His coronaries looked good. 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   He was hospitalized in 2016 with 20# weight gain - interestingly, his BNP was normal. No changes on chest CT scan to suggest amiodarone toxicity. He has been on amiodarone since his surgery. Previously had stopped it in 2013. 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 His atrial fibrillation came with a finger infection exacerbated by uncontrolled diabetes.  PFTs demonstrated moderate to severe restriction consistent with fibrosis or interstitial inflammations. Pulmonary referral was made. He had significant exposure in the 60s and 70s to coal dust. 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  He has had issues in the past with marked hypotension - medicines have had to be cut back.   Seen by Dr. Marlou Porch in June 2018. Chronic fatigue and chronic SOB. 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 High risk. Ended up having stress test which led to  TEE and TTE. Also had repeat cath showing no significant CAD. EF remains quite depressed.   I last saw him in June of 2019. Never had his shoulder surgery due to being too high risk. He had seen Dr. Roxan Hockey and had a repeat PET. He does remain on a very little CHF regimen - told me he could not tolerate ACE/ARB due to marked hypotension.   Last seen in July by Dr. Marlou Porch.    Comes in today. Here with his girlfriend. He tells me that his prior girlfriend/wife left him last month - was hiding his medicines. He now has a new girlfriend of 4 weeks who is helping him. He is in a wheelchair. He does not feel as well today. He had this anterior mass removed last month - seemed to have done ok. There was no evidence of malignancy. He was in NSR at that time. He noted some palpitations last night - but girlfriend checked his pulse and it was regular and around 70. He always has some degree of shortness of breath. His weight is up - he does not feel like it is accurate. Swelling is unchanged. Not really bloated. No chest pain. Seeing Dr. Roxan Hockey later today. He says he has had no missed doses of Eliquis over the last 4 weeks to his knowledge.   Past Medical History:  Diagnosis Date  . 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 Arthritis    lower back  . Cardiomyopathy ([place])    EF originally 20%-now 45%, no CAD on  cath  . Chronic combined systolic and diastolic CHF (congestive heart failure) ([place])    a. 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 Reported h/o cath in 2008 without significant CAD.  Marland Wright COPD (chronic obstructive pulmonary disease) ([place])    per patient  . Dyspnea    when CHF is up  . Hyperlipidemia few yrs ago  . Hypertension   . Hypertriglyceridemia   . PAF (paroxysmal atrial fibrillation) ([place])    a. Postop 2008. b. recurrent afib 08/2014 with LAA clot noted on TEE. EF was down again at 25%. He was  anticoagulated with Eliquis and placed on Amiodarone.He ultimately underwent TEE-DCCV 08/2014.  . S/P aortic valve replacement with bioprosthetic valve    a. bioprosthetic AVR in 05/2007 in [place].bovine valve  . Type II diabetes mellitus ([place])     Past Surgical History:  Procedure Laterality Date  . AMPUTATION Right 07/29/2014   Procedure: AMPUTATION RIGHT LONG FINGER;  Surgeon: [name], MD;  Location: [facility];  Service: Orthopedics;  Laterality: Right;  . AMPUTATION Right 01/13/2016   Procedure: RIGHT BELOW KNEE AMPUTATION;  Surgeon: Newt Minion, MD;  Location: [facility];  Service: Orthopedics;  Laterality: Right;  . AORTIC VALVE REPLACEMENT  05/2007   with tissue graft  . APPLICATION OF WOUND VAC Right 01/13/2016   Procedure: APPLICATION OF WOUND VAC;  Surgeon: Newt Minion, MD;  Location: [facility];  Service: Orthopedics;  Laterality: Right;  . CARDIAC CATHETERIZATION  "several"  . CARDIAC VALVE REPLACEMENT     Sept 2008  . CARDIOVERSION N/A 07/23/2014   Procedure: CARDIOVERSION;  Surgeon: Josue Hector, MD;  Location: [facility];  Service: Cardiovascular;  Laterality: N/A;  . CARDIOVERSION N/A 09/01/2014   Procedure: CARDIOVERSION;  Surgeon: [name], MD;  Location: [facility] ENDOSCOPY;  Service: Cardiovascular;  Laterality: N/A;  . cataracts    . COLONOSCOPY WITH PROPOFOL N/A 05/29/2017   Procedure: COLONOSCOPY WITH PROPOFOL;  Surgeon: Wilford Corner, MD;  Location: [facility] ENDOSCOPY;  Service: Endoscopy;  Laterality: N/A;  . I&D EXTREMITY Right 05/05/2014   Procedure: IRRIGATION AND DEBRIDEMENT EXTREMITY;  Surgeon: [name], MD;  Location: [facility];  Service: Orthopedics;  Laterality: Right;  . LEFT HEART CATH AND CORONARY ANGIOGRAPHY N/A 05/17/2017   Procedure: LEFT HEART CATH AND CORONARY ANGIOGRAPHY;  Surgeon: Martinique, Peter M, MD;  Location: [facility] CV LAB;  Service: Cardiovascular;  Laterality: N/A;  . RESECTION OF MEDIASTINAL MASS N/A 06/02/2018   Procedure: RESECTION OF  ANTERIOR MEDIASTINAL MASS;  Surgeon: Melrose Nakayama, MD;  Location: [place];  Service: Thoracic;  Laterality: N/A;  . STERNOTOMY N/A 06/02/2018   Procedure: REDO STERNOTOMY;  Surgeon: Melrose Nakayama, MD;  Location: [facility];  Service: Thoracic;  Laterality: N/A;  . TEE WITHOUT CARDIOVERSION N/A 07/23/2014   Procedure: TRANSESOPHAGEAL ECHOCARDIOGRAM (TEE);  Surgeon: Josue Hector, MD;  Location: [facility];  Service: Cardiovascular;  Laterality: N/A;  . TEE WITHOUT CARDIOVERSION N/A 09/01/2014   Procedure: TRANSESOPHAGEAL ECHOCARDIOGRAM (TEE);  Surgeon: [name], MD;  Location: [facility] ENDOSCOPY;  Service: Cardiovascular;  Laterality: N/A;  . TEE WITHOUT CARDIOVERSION N/A 04/17/2017   Procedure: TRANSESOPHAGEAL ECHOCARDIOGRAM (TEE);  Surgeon: Jerline Pain, MD;  Location: [facility] ENDOSCOPY;  Service: Cardiovascular;  Laterality: N/A;  . TONSILLECTOMY  1952     Medications: Current Meds  Medication Sig  . acetaminophen (TYLENOL) 500 MG tablet Take 2 tablets (1,000 mg total)  by mouth every 6 (six) hours as needed for mild pain.  Marland Wright atorvastatin (LIPITOR) 10 MG tablet Take 1 tablet (10 mg total) by mouth every morning.  . carvedilol (COREG) 6.25 MG tablet TAKE 1 TABLET TWO TIMES DAILY WITH A MEAL.  Marland Wright ELIQUIS 5 MG TABS tablet TAKE 1 TABLET TWICE DAILY  . empagliflozin (JARDIANCE) 10 MG TABS tablet Take 10 mg by mouth at bedtime.   . furosemide (LASIX) 40 MG tablet Take 1 tablet (40 mg total) by mouth daily.  . Insulin Glargine (TOUJEO SOLOSTAR) 300 UNIT/ML SOPN Inject 32 Units into the skin at bedtime.   . insulin regular (NOVOLIN R) 100 units/mL injection Inject 0.03 mLs (3 Units total) into the skin 3 (three) times daily before meals. (Patient taking differently: Inject 1-5 Units into the skin 3 (three) times daily before meals. Per sliding scale)  . JANUVIA 100 MG tablet Take 100 mg by mouth daily.  . metFORMIN (GLUCOPHAGE-XR) 500 MG 24 hr tablet Take 500 mg by mouth 2 (two) times daily.  .  [DISCONTINUED] amiodarone (PACERONE) 100 MG tablet Take 1 tablet (100 mg total) by mouth daily.  . [DISCONTINUED] furosemide (LASIX) 40 MG tablet Take 1 tablet (40 mg total) by mouth daily.     Allergies: Allergies  Allergen Reactions  . Dulaglutide Nausea And Vomiting    TRULICITY Intractable nausea and vomiting resulting in hospitalization  . Amoxicill-Clarithro-Omeprazole Diarrhea and Nausea And Vomiting    The mix Is what the patient can not take    Social History: The patient  reports that he has never smoked. He has never used smokeless tobacco. He reports that he drinks alcohol. He reports that he does not use drugs.   Family History: The patient's family history includes Colon cancer in his father; Congenital heart disease in his other; Diabetes in his father and maternal grandmother; Heart disease in his maternal grandfather; Heart murmur in his child; Liver disease in his brother.   Review of Systems: Please see the history of present illness.   Otherwise, the review of systems is positive for none.   All other systems are reviewed and negative.   Physical Exam: VS:  BP 140/70 (BP Location: Left Arm, Patient Position: Sitting, Cuff Size: Large)   Pulse (!) 104   Ht 6\' 1"  (1.854 m)   Wt 250 lb 1.9 oz (113.5 kg)   BMI 33.00 kg/m  .  BMI Body mass index is 33 kg/m.  Wt Readings from Last 3 Encounters:  07/08/18 250 lb 1.9 oz (113.5 kg)  06/05/18 251 lb 1.7 oz (113.9 kg)  05/28/18 228 lb 6 oz (103.6 kg)    General: Pleasant. Well developed, well nourished and in no acute distress.   HEENT: Normal.  Neck: Supple, no JVD, carotid bruits, or masses noted.  Cardiac: Irregular rhythm. Rate is a little fast. Sternum looks ok. Trace edema on the left leg.   Respiratory:  Lungs are clear to auscultation bilaterally with normal work of breathing.  GI: Soft and nontender.  MS: No deformity or atrophy. Gait not tested.  Skin: Warm and dry. Color is sallow.  Neuro:  Strength  and sensation are intact and no gross focal deficits noted.  Psych: Alert, appropriate and with normal affect.   LABORATORY DATA:  EKG:  EKG is ordered today. This demonstrates AF with elevated VR and LBBB.  Lab Results  Component Value Date   WBC 16.6 (H) 06/04/2018   HGB 13.1 06/04/2018   HCT 40.7  06/04/2018   PLT 226 06/04/2018   GLUCOSE 79 06/05/2018   CHOL 127 05/24/2015   TRIG 123.0 05/24/2015   HDL 38.70 (L) 05/24/2015   LDLCALC 64 05/24/2015   ALT 15 05/28/2018   AST 22 05/28/2018   NA 136 06/05/2018   K 3.8 06/05/2018   CL 101 06/05/2018   CREATININE 1.13 06/05/2018   BUN 23 06/05/2018   CO2 25 06/05/2018   TSH 1.510 02/18/2018   INR 1.01 05/28/2018   HGBA1C 8.4 (H) 05/15/2017   MICROALBUR 11.3 (H) 05/24/2015      BNP (last 3 results) No results for input(s): BNP in the last 8760 hours.  ProBNP (last 3 results) Recent Labs    02/18/18 0947  PROBNP 1,634*     Other Studies Reviewed Today:  Echo Study Conclusions 02/2018  - Left ventricle: The cavity size was normal. There was moderate   concentric hypertrophy. Systolic function was severely reduced.   The estimated ejection fraction was in the range of 25% to 30%.   Diffuse hypokinesis. Doppler parameters are consistent with   abnormal left ventricular relaxation (grade 1 diastolic   dysfunction). LV filling pressure is elevated. - Aortic valve: s/p bioprosthetic AVR wtih moderate calcific   stenosis. There was trivial regurgitation. Mean gradient (S): 16   mm Hg. Peak gradient (S): 25 mm Hg. Valve area (Vmax): 1.14 cm^2.   Valve area (Vmean): 1.17 cm^2. - Aorta: Dilated aortic root. Aortic root dimension: 42 mm (ED). - Mitral valve: Calcified annulus. Mildly thickened leaflets . - Left atrium: The atrium was normal in size. - Tricuspid valve: There was trivial regurgitation. - Pulmonary arteries: PA peak pressure: 18 mm Hg (S) + RAP.  Impressions:  - Compared to a prior study in 04/2017,  the LVEF is lower at 25-30%   with elevated LV filling pressure. The mean gradient across the   bioprosthetic AVR is higher at 16 mmHg, consistent with moderate   stenosis.  CT CHEST IMPRESSION 09/2017: 1. Grossly unchanged cystic structure within the anterior mediastinum. Recommend follow-up chest CT in 6 months to assess for interval change in size/stability. 2. No acute process within the chest. 3. Aortic Atherosclerosis (ICD10-I70.0).   Electronically Signed By: Lovey Newcomer M.D. On: 10/03/2017 12:51    LEFT HEART CATH AND CORONARY ANGIOGRAPHY 04/2017  Conclusion     LV end diastolic pressure is normal.  1. No significant CAD 2. Normal LVEDP 3. AV gradient approximately 10 mm Hg  Plan: Medical management.       Assessment/Plan:  1. PAF - he is back in AF today - unclear duration - seems to have not missed doses of Eliquis - on very low dose of amiodarone - will increase this to 200 mg BID until seen back. May need to consider cardioversion. Will have him take extra dose of Lasix over the next 3 days. BP surprisingly higher today. Typically much lower.   2. NICM - EF of 25 to 30% per echo from June of 2019. He is not able to be on guideline therapy due to marked hypotension  (down in the 50 to 20'N systolic previously noted).  But on some low dose Coreg and remains on diuretic. Salt restriction remains imperative.  His weight is up. This may be due to recurrent AF. Will use extra lasix for 3 days. We have not thought he would be a good candidate for Entresto given history of hypotension.  3.  Prior AVR from 2009 - most recent echo  noted.   4. High risk medicine - recent labs off [facility] and Epic noted. Rechecking today with med changes I have made.   5. Chronic anticoagulation - on Eliquis - no active bleeding noted.   6. Dilated aortic root - still noted on recent echo - it would appear to me that he is a poor candidate to me if surgical intervention  was warranted for this problem - this was not discussed today. He is seeing Dr. Roxan Hockey later today. I would plan to just follow.   7. Tendency towards hypotension - BP higher today.   8. Abnormal CT - followed previously by pulmonary - not felt to be related to amiodarone toxicity - his most recent scan from July showed an interval growth of the anterior mediastinal mass - this has been surgically removed as of 05/2018 per Dr. Roxan Hockey. This was benign.   9. History of left atrial appendage thrombus - on chronic anticoagulation.  10.Carotid artery disease-followed by [facility] - last study was in June of 2018 - noted recall in system for 2020. Seen by Dr. Donzetta Matters. Not discussed today.   11. Prior BKA  12. Questionable living situation - he seems to have his medicines back on track with this new friend.   Current medicines are reviewed with the patient today.  The patient does not have concerns regarding medicines other than what has been noted above.  The following changes have been made:  See above.  Labs/ tests ordered today include:    Orders Placed This Encounter  Procedures  . Basic metabolic panel  . CBC  . Hepatic function panel  . Pro b natriuretic peptide (BNP)  . TSH  . EKG 12-Lead     Disposition:   FU with Dr. Marlou Porch or me in about 10 days - will need cardioversion arranged if fails to convert.   Patient is agreeable to this plan and will call if any problems develop in the interim.   SignedTruitt Merle, NP  07/08/2018 10:18 AM  [facility] [address] [place], [place]  [address] Phone: [phone] Fax: [phone]

## 2018-07-08 NOTE — Patient Instructions (Addendum)
We will be checking the following labs today - BMET, CBC, HPF, TSH and BNP  If you have labs (blood work) drawn today and your tests are completely normal, you will receive your results only by: Marland Kitchen MyChart Message (if you have MyChart) OR . A paper copy in the mail If you have any lab test that is abnormal or we need to change your treatment, we will call you to review the results.   Medication Instructions:    Continue with your current medicines. BUT  I am increasing the amiodarone to 200 mg BID to take until we see you back - I have sent this to your local pharmacy  I would like you to take an extra dose of Lasix for the next 3 days - then back to your regular dose. I have sent your refill for this to [facility]   If you need a refill on your cardiac medications before your next appointment, please call your pharmacy.     Testing/Procedures To Be Arranged:  N/A  Follow-Up:   See me or Dr. Marlou Porch in about 10 days with EKG - possible cardioversion    At [facility], you and your health needs are our priority.  As part of our continuing mission to provide you with exceptional heart care, we have created designated Provider Care Teams.  These Care Teams include your primary Cardiologist (physician) and Advanced Practice Providers (APPs -  Physician Assistants and Nurse Practitioners) who all work together to provide you with the care you need, when you need it.  Special Instructions:  . None  Call the [place] office at [phone] if you have any questions, problems or concerns.

## 2018-07-09 DIAGNOSIS — Z954 Presence of other heart-valve replacement: Secondary | ICD-10-CM | POA: Diagnosis not present

## 2018-07-09 DIAGNOSIS — I110 Hypertensive heart disease with heart failure: Secondary | ICD-10-CM | POA: Diagnosis not present

## 2018-07-09 DIAGNOSIS — R05 Cough: Secondary | ICD-10-CM | POA: Diagnosis not present

## 2018-07-09 DIAGNOSIS — K219 Gastro-esophageal reflux disease without esophagitis: Secondary | ICD-10-CM | POA: Diagnosis not present

## 2018-07-09 DIAGNOSIS — I509 Heart failure, unspecified: Secondary | ICD-10-CM | POA: Diagnosis not present

## 2018-07-09 DIAGNOSIS — I5040 Unspecified combined systolic (congestive) and diastolic (congestive) heart failure: Secondary | ICD-10-CM | POA: Diagnosis not present

## 2018-07-09 DIAGNOSIS — J449 Chronic obstructive pulmonary disease, unspecified: Secondary | ICD-10-CM | POA: Diagnosis not present

## 2018-07-09 DIAGNOSIS — Z89611 Acquired absence of right leg above knee: Secondary | ICD-10-CM | POA: Diagnosis not present

## 2018-07-09 DIAGNOSIS — R0902 Hypoxemia: Secondary | ICD-10-CM | POA: Diagnosis not present

## 2018-07-09 DIAGNOSIS — E785 Hyperlipidemia, unspecified: Secondary | ICD-10-CM | POA: Diagnosis not present

## 2018-07-09 DIAGNOSIS — R0602 Shortness of breath: Secondary | ICD-10-CM | POA: Diagnosis not present

## 2018-07-09 DIAGNOSIS — Q87410 Marfan's syndrome with aortic dilation: Secondary | ICD-10-CM | POA: Diagnosis not present

## 2018-07-09 DIAGNOSIS — I447 Left bundle-branch block, unspecified: Secondary | ICD-10-CM | POA: Diagnosis not present

## 2018-07-09 DIAGNOSIS — I10 Essential (primary) hypertension: Secondary | ICD-10-CM | POA: Diagnosis not present

## 2018-07-09 DIAGNOSIS — I493 Ventricular premature depolarization: Secondary | ICD-10-CM | POA: Diagnosis not present

## 2018-07-09 DIAGNOSIS — E119 Type 2 diabetes mellitus without complications: Secondary | ICD-10-CM | POA: Diagnosis not present

## 2018-07-09 DIAGNOSIS — I440 Atrioventricular block, first degree: Secondary | ICD-10-CM | POA: Diagnosis not present

## 2018-07-09 DIAGNOSIS — I480 Paroxysmal atrial fibrillation: Secondary | ICD-10-CM | POA: Diagnosis not present

## 2018-07-09 DIAGNOSIS — I5043 Acute on chronic combined systolic (congestive) and diastolic (congestive) heart failure: Secondary | ICD-10-CM | POA: Diagnosis not present

## 2018-07-09 DIAGNOSIS — R0789 Other chest pain: Secondary | ICD-10-CM | POA: Diagnosis not present

## 2018-07-09 DIAGNOSIS — I714 Abdominal aortic aneurysm, without rupture: Secondary | ICD-10-CM | POA: Diagnosis not present

## 2018-07-13 DIAGNOSIS — I5043 Acute on chronic combined systolic (congestive) and diastolic (congestive) heart failure: Secondary | ICD-10-CM | POA: Diagnosis not present

## 2018-07-13 DIAGNOSIS — Z89511 Acquired absence of right leg below knee: Secondary | ICD-10-CM | POA: Diagnosis not present

## 2018-07-13 DIAGNOSIS — I482 Chronic atrial fibrillation, unspecified: Secondary | ICD-10-CM | POA: Diagnosis not present

## 2018-07-13 DIAGNOSIS — I110 Hypertensive heart disease with heart failure: Secondary | ICD-10-CM | POA: Diagnosis not present

## 2018-07-13 DIAGNOSIS — J40 Bronchitis, not specified as acute or chronic: Secondary | ICD-10-CM | POA: Diagnosis not present

## 2018-07-13 DIAGNOSIS — I447 Left bundle-branch block, unspecified: Secondary | ICD-10-CM | POA: Diagnosis not present

## 2018-07-13 DIAGNOSIS — E119 Type 2 diabetes mellitus without complications: Secondary | ICD-10-CM | POA: Diagnosis not present

## 2018-07-13 DIAGNOSIS — I480 Paroxysmal atrial fibrillation: Secondary | ICD-10-CM | POA: Diagnosis not present

## 2018-07-13 DIAGNOSIS — R1013 Epigastric pain: Secondary | ICD-10-CM | POA: Diagnosis not present

## 2018-07-13 DIAGNOSIS — Z23 Encounter for immunization: Secondary | ICD-10-CM | POA: Diagnosis not present

## 2018-07-13 DIAGNOSIS — I5040 Unspecified combined systolic (congestive) and diastolic (congestive) heart failure: Secondary | ICD-10-CM | POA: Diagnosis not present

## 2018-07-13 DIAGNOSIS — J209 Acute bronchitis, unspecified: Secondary | ICD-10-CM | POA: Diagnosis not present

## 2018-07-13 DIAGNOSIS — R0600 Dyspnea, unspecified: Secondary | ICD-10-CM | POA: Diagnosis not present

## 2018-07-13 DIAGNOSIS — R0902 Hypoxemia: Secondary | ICD-10-CM | POA: Diagnosis not present

## 2018-07-13 DIAGNOSIS — I10 Essential (primary) hypertension: Secondary | ICD-10-CM | POA: Diagnosis not present

## 2018-07-13 DIAGNOSIS — R0602 Shortness of breath: Secondary | ICD-10-CM | POA: Diagnosis not present

## 2018-07-14 DIAGNOSIS — I10 Essential (primary) hypertension: Secondary | ICD-10-CM | POA: Diagnosis not present

## 2018-07-14 DIAGNOSIS — R0902 Hypoxemia: Secondary | ICD-10-CM | POA: Diagnosis not present

## 2018-07-14 DIAGNOSIS — I5040 Unspecified combined systolic (congestive) and diastolic (congestive) heart failure: Secondary | ICD-10-CM | POA: Diagnosis not present

## 2018-07-14 DIAGNOSIS — R0600 Dyspnea, unspecified: Secondary | ICD-10-CM | POA: Diagnosis not present

## 2018-07-14 DIAGNOSIS — R0602 Shortness of breath: Secondary | ICD-10-CM | POA: Diagnosis not present

## 2018-07-14 DIAGNOSIS — R1013 Epigastric pain: Secondary | ICD-10-CM | POA: Diagnosis not present

## 2018-07-14 DIAGNOSIS — J40 Bronchitis, not specified as acute or chronic: Secondary | ICD-10-CM | POA: Diagnosis not present

## 2018-07-15 DIAGNOSIS — R0902 Hypoxemia: Secondary | ICD-10-CM | POA: Diagnosis not present

## 2018-07-15 DIAGNOSIS — I5040 Unspecified combined systolic (congestive) and diastolic (congestive) heart failure: Secondary | ICD-10-CM | POA: Diagnosis not present

## 2018-07-15 DIAGNOSIS — R1013 Epigastric pain: Secondary | ICD-10-CM | POA: Diagnosis not present

## 2018-07-15 DIAGNOSIS — J40 Bronchitis, not specified as acute or chronic: Secondary | ICD-10-CM | POA: Diagnosis not present

## 2018-07-15 DIAGNOSIS — R0602 Shortness of breath: Secondary | ICD-10-CM | POA: Diagnosis not present

## 2018-07-15 DIAGNOSIS — I10 Essential (primary) hypertension: Secondary | ICD-10-CM | POA: Diagnosis not present

## 2018-07-16 ENCOUNTER — Ambulatory Visit: Payer: Medicare HMO | Admitting: Cardiology

## 2018-07-16 ENCOUNTER — Ambulatory Visit: Payer: Self-pay | Admitting: Thoracic Surgery (Cardiothoracic Vascular Surgery)

## 2018-07-16 DIAGNOSIS — I10 Essential (primary) hypertension: Secondary | ICD-10-CM | POA: Diagnosis not present

## 2018-07-16 DIAGNOSIS — R0902 Hypoxemia: Secondary | ICD-10-CM | POA: Diagnosis not present

## 2018-07-16 DIAGNOSIS — R1013 Epigastric pain: Secondary | ICD-10-CM | POA: Diagnosis not present

## 2018-07-16 DIAGNOSIS — I5040 Unspecified combined systolic (congestive) and diastolic (congestive) heart failure: Secondary | ICD-10-CM | POA: Diagnosis not present

## 2018-07-16 DIAGNOSIS — R0602 Shortness of breath: Secondary | ICD-10-CM | POA: Diagnosis not present

## 2018-07-16 DIAGNOSIS — J40 Bronchitis, not specified as acute or chronic: Secondary | ICD-10-CM | POA: Diagnosis not present

## 2018-07-22 ENCOUNTER — Ambulatory Visit
Admission: RE | Admit: 2018-07-22 | Discharge: 2018-07-22 | Disposition: A | Discharge status: Home / Self Care | Payer: Medicare PPO | Source: Ambulatory Visit | Attending: Thoracic Surgery (Cardiothoracic Vascular Surgery) | Admitting: Thoracic Surgery (Cardiothoracic Vascular Surgery)

## 2018-07-22 ENCOUNTER — Ambulatory Visit (INDEPENDENT_AMBULATORY_CARE_PROVIDER_SITE_OTHER): Payer: Self-pay | Admitting: Thoracic Surgery (Cardiothoracic Vascular Surgery)

## 2018-07-22 ENCOUNTER — Encounter: Payer: Self-pay | Admitting: Thoracic Surgery (Cardiothoracic Vascular Surgery)

## 2018-07-22 ENCOUNTER — Other Ambulatory Visit: Payer: Self-pay

## 2018-07-22 VITALS — BP 100/60 | HR 101 | Resp 18 | Ht 73.0 in | Wt 236.7 lb

## 2018-07-22 DIAGNOSIS — E328 Other diseases of thymus: Secondary | ICD-10-CM

## 2018-07-22 DIAGNOSIS — J9859 Other diseases of mediastinum, not elsewhere classified: Secondary | ICD-10-CM

## 2018-07-22 NOTE — Progress Notes (Signed)
[address]       [place] [address]             [phone]     HPI: [name] returns for scheduled follow-up visit  Thomas Wright is a 72 year old man with a complicated cardiac history including aortic valve replacement with a bovine pericardial valve in 2008, paroxysmal atrial fibrillation, cardiomyopathy, chronic combined systolic and diastolic congestive heart failure, hypertension, hyperlipidemia, COPD, arthritis, and type 2 diabetes.  He was found to have an anterior mediastinal cyst on CT several years ago.  On PET CT this area was not hypermetabolic.  However, over serial scans the lesion slowly enlarged.  He underwent redo sternotomy for resection of the anterior mediastinal mass on 06/02/2018.  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  His initial postoperative course was uncomplicated and a went home on day 4.  About a week later he moved back to [place].  He developed shortness of breath and was admitted to the hospital up there with congestive heart failure.  He was discharged to a couple of days later he presented back with bronchitis and was readmitted again.  Since that was treated he has begun to feel progressively better.  He is having minimal discomfort.  His breathing has not recovered to his preoperative baseline but is better than it was 2 weeks ago.  Past Medical History:  Diagnosis Date  . 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 Arthritis    lower back  . Cardiomyopathy ([place])    EF originally 20%-now 45%, no CAD on cath  . Chronic combined systolic and diastolic CHF (congestive heart failure) ([place])    a. 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 Reported h/o cath in 2008 without significant CAD.  Marland Kitchen COPD (chronic obstructive pulmonary disease) ([place])    per patient  . Dyspnea    when CHF is up  . Hyperlipidemia few yrs ago  . Hypertension   .  Hypertriglyceridemia   . PAF (paroxysmal atrial fibrillation) ([place])    a. Postop 2008. b. recurrent afib 08/2014 with LAA clot noted on TEE. EF was down again at 25%. He was anticoagulated with Eliquis and placed on Amiodarone.He ultimately underwent TEE-DCCV 08/2014.  . S/P aortic valve replacement with bioprosthetic valve    a. bioprosthetic AVR in 05/2007 in [place].bovine valve  . Type II diabetes mellitus ([place])     Current Outpatient Medications  Medication Sig Dispense Refill  . acetaminophen (TYLENOL) 500 MG tablet Take 2 tablets (1,000 mg total) by mouth every 6 (six) hours as needed for mild pain. 30 tablet 0  . amiodarone (PACERONE) 200 MG tablet Take 1 tablet (200 mg total) by mouth 2 (two) times daily. (Patient taking differently: Take 400 mg by mouth 2 (two) times daily. ) 180 tablet 3  . atorvastatin (LIPITOR) 10 MG tablet Take 1 tablet (10 mg total) by mouth every morning. 90 tablet 3  . carvedilol (COREG) 6.25 MG tablet TAKE 1 TABLET TWO TIMES DAILY WITH A MEAL. 180 tablet 2  . ELIQUIS 5 MG TABS tablet TAKE 1 TABLET TWICE DAILY 180 tablet 2  . empagliflozin (JARDIANCE) 10 MG TABS tablet Take 10 mg by mouth at bedtime.     . furosemide (LASIX) 40 MG tablet Take 1 tablet (40 mg total) by mouth daily. 90 tablet 3  .  Insulin Glargine (TOUJEO SOLOSTAR) 300 UNIT/ML SOPN Inject 34 Units into the skin at bedtime.     . insulin regular (NOVOLIN R) 100 units/mL injection Inject 0.03 mLs (3 Units total) into the skin 3 (three) times daily before meals. (Patient taking differently: Inject 1-5 Units into the skin 3 (three) times daily before meals. Per sliding scale) 20 mL 3  . JANUVIA 100 MG tablet Take 100 mg by mouth daily.    . metFORMIN (GLUCOPHAGE-XR) 500 MG 24 hr tablet Take 500 mg by mouth 2 (two) times daily.     No current facility-administered medications for this visit.     Physical Exam BP 100/60 (BP Location: Right Arm, Patient Position: Sitting, Cuff Size: Normal)    Pulse (!) 101   Resp 18   Ht 6\' 1"  (1.854 m)   Wt 236 lb 11.2 oz (107.4 kg)   SpO2 96% Comment: RA  BMI 31.9 kg/m  72 year old man in no acute distress Alert and oriented x3 with no focal deficits Sternum stable, incision clean dry and intact Cardiac irregularly irregular, controlled rate Lungs clear with equal breath sounds bilaterally  Diagnostic Tests: CHEST - 2 VIEW  COMPARISON:  June 04, 2018  FINDINGS: The heart size and mediastinal contours are within stable. The heart size is enlarged. Chronic elevation of right hemidiaphragm is identified. There is no focal infiltrate, pulmonary edema, or pleural effusion. The visualized skeletal structures are stable.  IMPRESSION: No active cardiopulmonary disease.   Electronically Signed   By: Abelardo Diesel M.D.   On: 07/22/2018 15:41  Impression: Thomas Wright is a 73 year old gentleman with a complicated medical history with an especially extensive cardiac history.  He was incidentally found to have an anterior mediastinal cyst during her work-up for dyspnea.  This was not hypermetabolic on PET but did slowly grow over time.  I did a redo sternotomy to remove the anterior mediastinal cyst on 06/02/2018.  His early postoperative course was uncomplicated but he was readmitted to a different hospital with congestive heart failure and bronchitis postoperatively.  At this point he seems to be doing well.  He is not having any significant pain.  He is not taking any narcotics.  His incision is healing well.  His exercise tolerance is gradually improving.  I do not think he is ready to drive yet but probably will be within a week or 2 when he feels a little stronger.  He should not lift anything over 20 pounds for another 2 weeks.  After that his activities are unrestricted.  Interestingly, he moved from [place] to [place] which is where he is from generally about a week after he went home from his surgery.  He plans to  stay up there.  He does have a cardiologist in [place] who he will follow up with.  Plan: No further follow-up needed in regards to benign mediastinal cyst.  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  Can be followed by his cardiologist in [place].  Melrose Nakayama, MD [facility] [phone]

## 2018-08-05 ENCOUNTER — Telehealth: Payer: Self-pay | Admitting: Cardiology

## 2018-08-05 NOTE — Telephone Encounter (Signed)
Pt would like to be referred to Dr Einar Grad with [facility], [place], [place].  Will forward to Dr Marlou Porch for review and order.

## 2018-08-05 NOTE — Telephone Encounter (Signed)
Pt aware his records are being faxed at his request to Dr Einar Grad.  He is aware they will contact him to schedule once they receive the records and they have been reviewed.

## 2018-08-05 NOTE — Telephone Encounter (Signed)
New Message:     Pt says he  Have moved. He wants Dr Marlou Porch to refer him to Dr [phone]) in [place].Marland Kitchen

## 2018-08-05 NOTE — Telephone Encounter (Signed)
Ok per Dr Marlou Porch

## 2018-08-06 ENCOUNTER — Ambulatory Visit: Payer: Medicare HMO | Admitting: Nurse Practitioner

## 2018-08-06 ENCOUNTER — Encounter: Payer: Self-pay | Admitting: *Deleted

## 2018-08-11 ENCOUNTER — Encounter: Payer: Self-pay | Admitting: Cardiothoracic Surgery

## 2018-08-11 ENCOUNTER — Encounter: Payer: Self-pay | Admitting: *Deleted

## 2018-08-12 ENCOUNTER — Encounter: Payer: Self-pay | Admitting: *Deleted

## 2018-08-18 NOTE — Telephone Encounter (Signed)
   Patient is calling because the cardiologist he wanted to be referred to is telling him that they have not received the referral or his records from [place]. Please fax to [phone] to attention Dr Sherren Mocha

## 2018-08-18 NOTE — Telephone Encounter (Signed)
Checked with MR who reports records were faxed as requested to Dr Einar Grad on both 11/14 and 11/19th to [phone].  Called and notified patient of the above information.  Advised if he needs records sent to a different MD he will need to call and speak with MR so that they can obtain a verbal ROI.  Phone # given to reach [place].  Pt states he will call the office and notify them his records were fax and to where/when.  He will call back if he needs further assistance.  He was grateful for the call and information.

## 2019-01-30 ENCOUNTER — Telehealth: Payer: Self-pay | Admitting: Cardiology

## 2019-01-30 NOTE — Telephone Encounter (Signed)
I called patient to schedule his follow up appointment with Dr. Marlou Porch / (APP). Patient lives in [place] and has found another cardiologist there. He said that he will not be coming back to see Dr. Marlou Porch. I have removed his name from the recall list.

## 2019-02-02 NOTE — Telephone Encounter (Signed)
Noted! Thank you

## 2019-09-09 ENCOUNTER — Other Ambulatory Visit: Payer: Self-pay | Admitting: Pharmacist

## 2019-09-09 ENCOUNTER — Other Ambulatory Visit: Payer: Self-pay | Admitting: Nurse Practitioner

## 2019-09-09 ENCOUNTER — Other Ambulatory Visit: Payer: Self-pay | Admitting: Cardiology

## 2019-09-09 DIAGNOSIS — I480 Paroxysmal atrial fibrillation: Secondary | ICD-10-CM

## 2019-09-09 DIAGNOSIS — I5022 Chronic systolic (congestive) heart failure: Secondary | ICD-10-CM

## 2019-09-09 DIAGNOSIS — I77810 Thoracic aortic ectasia: Secondary | ICD-10-CM

## 2019-09-09 DIAGNOSIS — Z954 Presence of other heart-valve replacement: Secondary | ICD-10-CM

## 2019-09-09 NOTE — Progress Notes (Addendum)
Update - pt moved to [facility] and further med refills will need to come from new MD. [facility] and provided them with pt's new MD's name - Dr Kathrene Alu for future refills.

## 2019-09-09 NOTE — Telephone Encounter (Signed)
Age 73, weight 107kg, SCr 1/24 in Oct 2019, has not been seen in clinic since then either. Pt is overdue for follow up and lab work. Will send message to scheduling to coordinate follow up visit. Will send in 1 month refill in the mean time as dosing is appropriate based on age and weight.

## 2019-12-17 DEATH — deceased
